# Patient Record
Sex: Male | Born: 1950
Health system: Southern US, Community
[De-identification: ages and names within clinical notes are randomized; demographics above are authoritative.]

## PROBLEM LIST (undated history)

## (undated) DIAGNOSIS — N529 Male erectile dysfunction, unspecified: Secondary | ICD-10-CM

## (undated) DIAGNOSIS — H269 Unspecified cataract: Secondary | ICD-10-CM

## (undated) DIAGNOSIS — H4050X1 Glaucoma secondary to other eye disorders, unspecified eye, mild stage: Secondary | ICD-10-CM

## (undated) DIAGNOSIS — K08109 Complete loss of teeth, unspecified cause, unspecified class: Secondary | ICD-10-CM

## (undated) DIAGNOSIS — I2510 Atherosclerotic heart disease of native coronary artery without angina pectoris: Secondary | ICD-10-CM

## (undated) DIAGNOSIS — Z972 Presence of dental prosthetic device (complete) (partial): Secondary | ICD-10-CM

## (undated) DIAGNOSIS — I219 Acute myocardial infarction, unspecified: Secondary | ICD-10-CM

## (undated) DIAGNOSIS — E785 Hyperlipidemia, unspecified: Secondary | ICD-10-CM

## (undated) DIAGNOSIS — S83209A Unspecified tear of unspecified meniscus, current injury, unspecified knee, initial encounter: Secondary | ICD-10-CM

## (undated) DIAGNOSIS — H579 Unspecified disorder of eye and adnexa: Secondary | ICD-10-CM

## (undated) DIAGNOSIS — I70219 Atherosclerosis of native arteries of extremities with intermittent claudication, unspecified extremity: Secondary | ICD-10-CM

## (undated) DIAGNOSIS — Z955 Presence of coronary angioplasty implant and graft: Secondary | ICD-10-CM

## (undated) DIAGNOSIS — I10 Essential (primary) hypertension: Secondary | ICD-10-CM

## (undated) DIAGNOSIS — M1990 Unspecified osteoarthritis, unspecified site: Secondary | ICD-10-CM

## (undated) HISTORY — DX: Essential (primary) hypertension: I10

## (undated) HISTORY — PX: NECK SURGERY: SHX720

## (undated) HISTORY — DX: Male erectile dysfunction, unspecified: N52.9

## (undated) HISTORY — DX: Unspecified cataract: H26.9

## (undated) HISTORY — DX: Atherosclerotic heart disease of native coronary artery without angina pectoris: I25.10

## (undated) HISTORY — DX: Hyperlipidemia, unspecified: E78.5

## (undated) HISTORY — PX: EYE SURGERY: SHX253

## (undated) HISTORY — PX: COLONOSCOPY: SHX174

## (undated) HISTORY — DX: Atherosclerosis of native arteries of extremities with intermittent claudication, unspecified extremity: I70.219

---

## 2002-10-21 DIAGNOSIS — I219 Acute myocardial infarction, unspecified: Secondary | ICD-10-CM

## 2002-10-21 HISTORY — DX: Acute myocardial infarction, unspecified: I21.9

## 2002-10-23 HISTORY — PX: CARDIAC CATHETERIZATION: SHX172

## 2002-12-02 ENCOUNTER — Inpatient Hospital Stay (HOSPITAL_COMMUNITY): Admission: EM | Admit: 2002-12-02 | Discharge: 2002-12-07 | Payer: Self-pay | Admitting: Cardiology

## 2002-12-02 ENCOUNTER — Encounter: Payer: Self-pay | Admitting: Emergency Medicine

## 2002-12-02 ENCOUNTER — Emergency Department (HOSPITAL_COMMUNITY): Admission: EM | Admit: 2002-12-02 | Discharge: 2002-12-02 | Payer: Self-pay | Admitting: Emergency Medicine

## 2002-12-03 ENCOUNTER — Encounter: Payer: Self-pay | Admitting: Cardiology

## 2002-12-04 ENCOUNTER — Encounter: Payer: Self-pay | Admitting: Cardiology

## 2002-12-06 ENCOUNTER — Encounter (INDEPENDENT_AMBULATORY_CARE_PROVIDER_SITE_OTHER): Payer: Self-pay | Admitting: Cardiology

## 2010-08-08 ENCOUNTER — Ambulatory Visit
Admission: RE | Admit: 2010-08-08 | Discharge: 2010-08-08 | Payer: Self-pay | Source: Home / Self Care | Attending: Family Medicine | Admitting: Family Medicine

## 2010-08-16 ENCOUNTER — Ambulatory Visit
Admission: RE | Admit: 2010-08-16 | Discharge: 2010-08-16 | Payer: Self-pay | Source: Home / Self Care | Attending: Family Medicine | Admitting: Family Medicine

## 2010-09-10 ENCOUNTER — Encounter (INDEPENDENT_AMBULATORY_CARE_PROVIDER_SITE_OTHER): Payer: Self-pay | Admitting: *Deleted

## 2010-09-10 ENCOUNTER — Encounter (INDEPENDENT_AMBULATORY_CARE_PROVIDER_SITE_OTHER): Payer: 59 | Admitting: Family Medicine

## 2010-09-10 DIAGNOSIS — Z79899 Other long term (current) drug therapy: Secondary | ICD-10-CM

## 2010-09-10 DIAGNOSIS — E119 Type 2 diabetes mellitus without complications: Secondary | ICD-10-CM

## 2010-09-10 DIAGNOSIS — I10 Essential (primary) hypertension: Secondary | ICD-10-CM

## 2010-09-10 DIAGNOSIS — Z0000 Encounter for general adult medical examination without abnormal findings: Secondary | ICD-10-CM

## 2010-09-12 ENCOUNTER — Encounter (INDEPENDENT_AMBULATORY_CARE_PROVIDER_SITE_OTHER): Payer: Self-pay

## 2010-09-13 ENCOUNTER — Encounter: Payer: Self-pay | Admitting: Gastroenterology

## 2010-09-18 NOTE — Miscellaneous (Signed)
Summary: [facility] PV  Clinical Lists Changes  Medications: Added new medication of MOVIPREP 100 GM  SOLR (PEG-KCL-NACL-NASULF-NA ASC-C) As per prep instructions. - Signed Rx of MOVIPREP 100 GM  SOLR (PEG-KCL-NACL-NASULF-NA ASC-C) As per prep instructions.;  #1 x 0;  Signed;  Entered by: [name] RN II;  Authorized by: Sable Feil MD [facility];  Method used: [facility]  [address], [place], [place]  [address], Ph: [phone] or [phone], Fax: [phone] Observations: Added new observation of ALLERGY REV: Done (09/13/2010 15:04) Added new observation of NKA: T (09/13/2010 15:04)    Prescriptions: MOVIPREP 100 GM  SOLR (PEG-KCL-NACL-NASULF-NA ASC-C) As per prep instructions.  #1 x 0   Entered by:   [name] RN II   Authorized by:   Sable Feil MD [facility]   Signed by:   [name] RN II on 09/13/2010   Method used:   [facility]ron[facility]        [facility]  [address] (retail)       [address]       [place], [place]  [address]       Ph: [phone] or [phone]       Fax: [phone]   RxID:   [id number]

## 2010-09-18 NOTE — Letter (Signed)
Summary: Pre Visit Letter Revised  [facility]  [place], [place] [address]   Phone: [phone]  Fax: [phone]        09/10/2010 MRN: [id number] Jorge Adams [address] [place], [place]  [address]             Procedure Date:  September 24, 2010   dir col-Dr Ferdie Ping to the Gastroenterology Division at [facility].    You are scheduled to see a nurse for your pre-procedure visit on September 13, 2010 at 3:30pm on the 3rd floor at [facility].  We ask that you try to arrive at our office 15 minutes prior to your appointment time to allow for check-in.  Please take a minute to review the attached form.  If you answer "Yes" to one or more of the questions on the first page, we ask that you call the person listed at your earliest opportunity.  If you answer "No" to all of the questions, please complete the rest of the form and bring it to your appointment.    Your nurse visit will consist of discussing your medical and surgical history, your immediate family medical history, and your medications.   If you are unable to list all of your medications on the form, please bring the medication bottles to your appointment and we will list them.  We will need to be aware of both prescribed and over the counter drugs.  We will need to know exact dosage information as well.    Please be prepared to read and sign documents such as consent forms, a financial agreement, and acknowledgement forms.  If necessary, and with your consent, a friend or relative is welcome to sit-in on the nurse visit with you.  Please bring your insurance card so that we may make a copy of it.  If your insurance requires a referral to see a specialist, please bring your referral form from your primary care physician.  No co-pay is required for this nurse visit.     If you cannot keep your appointment, please call [phone] to cancel or reschedule prior  to your appointment date.  This allows [place] the opportunity to schedule an appointment for another patient in need of care.    Thank you for choosing [facility] for your medical needs.  We appreciate the opportunity to care for you.  Please visit [place] at our website  to learn more about our practice.  Sincerely, The Gastroenterology Division

## 2010-09-18 NOTE — Letter (Signed)
Summary: [facility] Instructions  [facility]  [name], [place] [address]   Phone: [phone]  Fax: [phone]       Jorge Adams    30-Mar-1951    MRN: [id number]        Procedure Day Jorge Adams:  Jorge Adams  10/08/10     Arrival Time:  1:00PM     Procedure Time:  2:00PM     Location of Procedure:                    _ X_  [name] (4th Floor)                      [name]   Starting 5 days prior to your procedure 10/03/10 do not eat nuts, seeds, popcorn, corn, beans, peas,  salads, or any raw vegetables.  Do not take any fiber supplements (e.g. Metamucil, Citrucel, and Benefiber).  THE DAY BEFORE YOUR PROCEDURE         DATE: 10/07/10  DAY: SUNDAY  1.  Drink clear liquids the entire day-NO SOLID FOOD  2.  Do not drink anything colored red or purple.  Avoid juices with pulp.  No orange juice.  3.  Drink at least 64 oz. (8 glasses) of fluid/clear liquids during the day to prevent dehydration and help the prep work efficiently.  CLEAR LIQUIDS INCLUDE: Water Jello Ice Popsicles Tea (sugar ok, no milk/cream) Powdered fruit flavored drinks Coffee (sugar ok, no milk/cream) Gatorade Juice: apple, white grape, white cranberry  Lemonade Clear bullion, consomm, broth Carbonated beverages (any kind) Strained chicken noodle soup Hard Candy                             4.  In the morning, mix first dose of MoviPrep solution:    Empty 1 Pouch A and 1 Pouch B into the disposable container    Add lukewarm drinking water to the top line of the container. Mix to dissolve    Refrigerate (mixed solution should be used within 24 hrs)  5.  Begin drinking the prep at 5:00 p.m. The MoviPrep container is divided by 4 marks.   Every 15 minutes drink the solution down to the next mark (approximately 8 oz) until the full liter is complete.   6.  Follow completed prep with 16 oz of clear liquid of your choice (Nothing  red or purple).  Continue to drink clear liquids until bedtime.  7.  Before going to bed, mix second dose of MoviPrep solution:    Empty 1 Pouch A and 1 Pouch B into the disposable container    Add lukewarm drinking water to the top line of the container. Mix to dissolve    Refrigerate  THE DAY OF YOUR PROCEDURE      DATE: 10/08/10  DAY: MONDAY  Beginning at 9:00AM (5 hours before procedure):         1. Every 15 minutes, drink the solution down to the next mark (approx 8 oz) until the full liter is complete.  2. Follow completed prep with 16 oz. of clear liquid of your choice.    3. You may drink clear liquids until 12:00PM (2 HOURS BEFORE PROCEDURE).   MEDICATION INSTRUCTIONS  Unless otherwise instructed, you should take regular prescription medications with a small sip of water   as early as possible the morning of your  procedure.          OTHER INSTRUCTIONS  You will need a responsible adult at least 62 years of age to accompany you and drive you home.   This person must remain in the waiting room during your procedure.  Wear loose fitting clothing that is easily removed.  Leave jewelry and other valuables at home.  However, you may wish to bring a book to read or  an iPod/MP3 player to listen to music as you wait for your procedure to start.  Remove all body piercing jewelry and leave at home.  Total time from sign-in until discharge is approximately 2-3 hours.  You should go home directly after your procedure and rest.  You can resume normal activities the  day after your procedure.  The day of your procedure you should not:   Drive   Make legal decisions   Operate machinery   Drink alcohol   Return to work  You will receive specific instructions about eating, activities and medications before you leave.    The above instructions have been reviewed and explained to me by   Jorge Pounds RN II  September 13, 2010 3:24  PM _______________________    I fully understand and can verbalize these instructions _____________________________ Date _________

## 2010-09-24 ENCOUNTER — Other Ambulatory Visit: Payer: 59 | Admitting: Gastroenterology

## 2010-09-26 ENCOUNTER — Institutional Professional Consult (permissible substitution) (INDEPENDENT_AMBULATORY_CARE_PROVIDER_SITE_OTHER): Payer: 59 | Admitting: Family Medicine

## 2010-09-26 DIAGNOSIS — E291 Testicular hypofunction: Secondary | ICD-10-CM

## 2010-09-26 DIAGNOSIS — E559 Vitamin D deficiency, unspecified: Secondary | ICD-10-CM

## 2010-10-08 ENCOUNTER — Other Ambulatory Visit: Payer: Self-pay | Admitting: Gastroenterology

## 2010-10-08 ENCOUNTER — Other Ambulatory Visit (AMBULATORY_SURGERY_CENTER): Payer: 59 | Admitting: Gastroenterology

## 2010-10-08 DIAGNOSIS — Z1211 Encounter for screening for malignant neoplasm of colon: Secondary | ICD-10-CM

## 2010-10-08 DIAGNOSIS — D126 Benign neoplasm of colon, unspecified: Secondary | ICD-10-CM

## 2010-10-08 HISTORY — PX: COLONOSCOPY: SHX174

## 2010-10-15 ENCOUNTER — Encounter: Payer: Self-pay | Admitting: Gastroenterology

## 2010-10-18 NOTE — Procedures (Signed)
Summary: Colonoscopy  Patient: [name] Note: All result statuses are Final unless otherwise noted.  Tests: (1) Colonoscopy (COL)   COL Colonoscopy           [facility]     [place], [place]  [address]          COLONOSCOPY PROCEDURE REPORT          PATIENT:  Jorge Adams, Jorge Adams  MR#:  [id number]     BIRTHDATE:  01-08-1951, 11 yrs. old  GENDER:  male     ENDOSCOPIST:  Loralee Pacas. Sharlett Iles, MD, [facility]     REF. BY:  Jill Alexanders, M.D.     PROCEDURE DATE:  10/08/2010     PROCEDURE:  Colonoscopy with snare polypectomy     ASA CLASS:  Class II     INDICATIONS:  Routine Risk Screening     MEDICATIONS:   Fentanyl 50 mcg IV, Versed 7 mg IV          DESCRIPTION OF PROCEDURE:   After the risks benefits and     alternatives of the procedure were thoroughly explained, informed     consent was obtained.  Digital rectal exam was performed and     revealed no abnormalities.   The LB CF-H180AL [id number] endoscope     was introduced through the anus and advanced to the cecum, which     was identified by both the appendix and ileocecal valve, without     limitations.  The quality of the prep was excellent, using     MoviPrep.  The instrument was then slowly withdrawn as the colon     was fully examined.     <<PROCEDUREIMAGES>>          FINDINGS:  A sessile polyp was found. 5MM HEPATIC FLEXURE POLYP     SNARE EXCISED.  A sessile polyp was found in the sigmoid colon.     5MM SIGMOID POLYP SNARE EXCISED ALSO.  This was otherwise a normal     examination of the colon.   Retroflexed views in the rectum     revealed no abnormalities.    The scope was then withdrawn from     the patient and the procedure completed.          COMPLICATIONS:  None     ENDOSCOPIC IMPRESSION:     1) Sessile polyp     2) Sessile polyp in the sigmoid colon     3) Otherwise normal examination     R/O ADENOMAS.     RECOMMENDATIONS:     1) Repeat Colonoscopy in 5 years.  REPEAT EXAM:  No          ______________________________     Loralee Pacas. Sharlett Iles, MD, [facility]          CC:          n.     eSIGNED:   Loralee Pacas. Deivi Huckins at 10/08/2010 02:45 PM          Rosita Fire, [id number]  Note: An exclamation mark (!) indicates a result that was not dispersed into the flowsheet. Document Creation Date: 10/08/2010 2:45 PM _______________________________________________________________________  (1) Order result status: Final Collection or observation date-time: 10/08/2010 14:34 Requested date-time:  Receipt date-time:  Reported date-time:  Referring Physician:   Ordering Physician: Verl Blalock [phone]) Specimen Source:  Source: Tawanna Cooler Order Number: [id number] Lab site:

## 2010-12-07 NOTE — H&P (Signed)
NAME:  Jorge Adams, Jorge Adams NO.:  [id number]   MEDICAL RECORD NO.:  [id number]                   PATIENT TYPE:  EMS   LOCATION:  ED                                   FACILITY:  [facility]   PHYSICIAN:  Bryson Dames, M.D.             DATE OF BIRTH:  Aug 13, 1950   DATE OF ADMISSION:  12/02/2002  DATE OF DISCHARGE:                                HISTORY & PHYSICAL   CHIEF COMPLAINT:  The patient had ventricular fibrillation on the floor  while visiting his wife in the hospital here at [facility].   HISTORY OF PRESENT ILLNESS:  The patient is a 60 year old male with no prior  history of coronary disease or MI or syncope.  He was admitted to the  emergency room at [facility] after a syncopal spell, with  documented VF while visiting his wife.  Symptoms were preceded by chest  tightness.  He admits to substernal chest tightness in the last few days  with radiation to his jaw.  He has no discomfort currently.  His EKG is  without acute changes.   PAST MEDICAL HISTORY:  His past medical history is remarkable for  hypertension and hyperlipidemia.  He was told recently his cholesterol was  317 and that they would try to get this under control with diet.   PAST SURGICAL HISTORY:  He has had no major surgeries.   MEDICATIONS AT HOME:  Lisinopril and hydrochlorothiazide.   ALLERGIES:  He has no known drug allergies.   SOCIAL HISTORY:  He is married, he has two sons, smokes a pack and a half a  day.  His wife is hospitalized here with bone cancer and complications.   FAMILY HISTORY:  His family history is unremarkable for coronary disease.  His mother died of cancer.  His father is alive at 80 and has a history of  hypertension.  He has one brother without coronary disease.   REVIEW OF SYSTEMS:  The patient has poor dentition.  He has had some  nocturia, up to two to three times a night.  He has no history of BPH.  He  has no history of fever or  chills.  He has no history of kidney disease.  There is no history of prior syncope.  He has no history of peptic ulcer  disease or GI bleeding.  Review of systems otherwise unremarkable except for  noted above.   PHYSICAL EXAMINATION:  VITAL SIGNS:  Blood pressure 140/92, pulse 100,  temperature 97.2.  GENERAL:  In general, he is a well-developed, well-nourished male in no  acute distress.  HEENT:  He is normocephalic.  Extraocular movement are intact.  Sclerae are  nonicteric.  Lids and conjunctivae are within normal limits.  He does have  poor dentition.  NECK:  Neck is without bruit and without JVD.  CHEST:  Chest is clear to auscultation and percussion.  CARDIAC:  Exam reveals a regular rate and rhythm without obvious murmur, rub  or gallop.  ABDOMEN:  Abdomen is nontender.  No hepatosplenomegaly.  EXTREMITIES:  Extremities are without edema.  Pulses are 2+/4 bilaterally.  NEUROLOGIC:  Exam is grossly intact.   LABORATORY AND ACCESSORY CLINICAL DATA:  His EKG shows sinus rhythm without  acute changes.   Sodium 137, potassium 3.4, BUN 10, creatinine 0.9.  White count 17.6,  hemoglobin 17.3, hematocrit 49.4, platelets 271,000.  INR 0.8.  CK is 420,  MB 55, troponin 2.3.   Chest x-ray is pending.   IMPRESSION:  1. Ventricular fibrillation, shocked once to sinus rhythm.  2. Subacute endocardial myocardial infarction.  3. History of hypertension.  4. Untreated hyperlipidemia.  5. Elevated white count, unclear etiology, possibly stress from myocardial     infarction.   PLAN:  The patient was started on aspirin, heparin, nitrates, beta blocker  and continued on IV lidocaine.  He will need catheterization, we may need to  take him this evening.     Erlene Quan, P.A.                      Bryson Dames, M.D.    Meryl Dare  D:  12/02/2002  T:  12/03/2002  Job:  [id number]

## 2010-12-07 NOTE — Discharge Summary (Signed)
NAME:  Jorge Adams, Jorge Adams NO.:  [id number]   MEDICAL RECORD NO.:  [id number]                   PATIENT TYPE:  INP   LOCATION:  2005                                 FACILITY:  [facility]   PHYSICIAN:  Bryson Dames, M.D.             DATE OF BIRTH:  [birth year]   DATE OF ADMISSION:  12/02/2002  DATE OF DISCHARGE:  12/07/2002                                 DISCHARGE SUMMARY   HISTORY:  Mr. Jorge Adams is a 60 year old male patient who was  visiting his wife at [facility] when he had a V fib arrest.  His  arrhythmia was preceded by some chest pain.  He was seen by Dr. Melvern Banker  initially and sent to [facility] via __________ for emergent  cardiac catheterization.  His first troponin apparently was positive.  He  apparently had been having chest pain on and off for several days prior to  his cardiac arrest.  He was defibrillated one time with a return to sinus  rhythm.   He underwent his cardiac catheterization on Dec 02, 2002, at 7:45 p.m.; he  was found to have a 90% stenosis of a large OM-1.  He underwent TAXUS stent  placement, 2.5 x 24.  He also had residual disease, 95% in his RCA and a 30%  in his LAD.  He had 100% old distal RCA.  He had an inferior scar and new  anterolateral hypokinesis, thought to be related to his acute 90% lesion in  his OM-1.   Post procedure, he had continuous Integrilin for 18 hours.  They continued  his IV lidocaine, nitroglycerin, heparin, and he was placed on Plavix.  He  was also found to have hypokalemia.  He apparently, as an outpatient, had  been on lisinopril, HCTZ.  This was replaced.  The following day, he had  symptoms of cold feet; this was thought secondary to beta blockers; however,  the cold feet then progressed to pain in his feet with his right foot worse  than his left.  His toes then became bluish.  He underwent Dopplers, which  showed his ABIs were 100%.  He was seen by Dr. Scot Dock  for a vascular  consult; it was determined that he probably had an atheroemboli, and no  further treatment or workup was needed, that his discomfort would resolve  over a period of time.  He was seen by cardiac rehab; a rolling walker was  used for his ambulation, which helped immensely.  A 2-D echo was performed,  which showed an EF of 55 to 65%.  Right ventricular systolic function was  hyperdynamic.  There was no mention of thrombus and no pericardial effusion.   On Dec 07, 2002, his blood pressure was 110/84, heart rate was 98,  temperature was 97.  He was seen by Dr. Claiborne Billings with the decision to increase  his Lopressor to 50 mg b.i.d., and  he will follow up with Dr. Melvern Banker as an  outpatient.   LABORATORY DATA:  On Dec 06, 2002, his hemoglobin was 14, hematocrit 41,  WBCs were 8.9, platelets were 222.  Sodium was 135, potassium was 4.3,  glucose was 114, BUN 10, creatinine 0.7.  CK-MB number one 1159/140,  troponin of 20.60, number two 1180/128, troponin of 18.71, number three  916/73, troponin of 16.76.  Total cholesterol was 223, triglycerides were  277, his HDL was 36, his LDL was 132, his VLDL was 55.  TSH was 1.054.  Chest x-ray showed minimal infiltrate or atelectasis at the left lung base.  EKG showed a nonspecific T-wave abnormality, sinus rhythm.   DISCHARGE MEDICATIONS:  1. Lopressor 50 mg twice per day.  2. Plavix 75 mg once per day.  3. Zocor 80 mg once per day.  4. Lisinopril 20 mg once per day.  5. Aspirin 81 mg once per day.  6. Protonix 40 mg once per day.  7. Nitroglycerin 1/150 under the tongue every 5 minutes times three for     chest pain when needed.   ACTIVITY:  He should do no strenuous activity, no driving or returning to  work until seen in followup.   DISCHARGE INSTRUCTIONS:  If he has any problems with his groin, he will give  [place] a call.   FOLLOW UP:  He will follow up with Dr. Melvern Banker on December 23, 2002, at 3 p.m.   DISCHARGE DIAGNOSES:  1. Acute  non-Q-wave myocardial infarction, anatomically anterolateral,     status post catheterization with a 90% first obtuse marginal with     subsequent TAXUS stent placed.  2. 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  3. Positive residual disease, 30% in his left anterior descending, 95% in     mid right coronary artery with 100% distal right coronary artery, which     was thought to be old.  4. Normal ejection fraction by echocardiogram.  5. Hyperlipidemia.  6. Hypertension.  7. Ventricular fibrillation arrest at onset of acute diaphragmatic     myocardial infarction.     Cyndia Bent, N.P.                        Bryson Dames, M.D.    BB/MEDQ  D:  02/02/2003  T:  02/03/2003  Job:  [id number]   cc:   Jill Alexanders, M.D.  [address], [place] [address]  Fax: [phone]    cc:   Jill Alexanders, M.D.  [address] [place], [place] [address]  Fax: [phone]

## 2010-12-07 NOTE — Consult Note (Signed)
NAME:  Jorge Adams, SLATES                      ACCOUNT NO.:  [id number]   MEDICAL RECORD NO.:  [id number]                   PATIENT TYPE:  INP   LOCATION:  2908                                 FACILITY:  [facility]   PHYSICIAN:  Judeth Cornfield. Scot Dock, M.D.        DATE OF BIRTH:  05/21/1951   DATE OF CONSULTATION:  DATE OF DISCHARGE:                                   CONSULTATION   REASON FOR CONSULTATION:  Atheroembolic disease to the right foot.   HISTORY:  This is a pleasant 60 year old gentleman who was visiting his wife  at [facility] three days ago when he apparently had a cardiac  arrest. He had ventricular fibrillation and was successfully shocked. He was  transferred to [facility] and underwent emergent cardiac catheterization by Dr.  Melvern Banker on Dec 02, 2002.  Via a right femoral approach he had a percutaneous  coronary intervention and stenting which was successful, and he has done  well since that time.  However, since his cath he has noted some pain in the  toes of the right foot.  He denies any previous history of claudication,  rest pain, or non-healing ulcers. The pain has been fairly persistent.   PAST MEDICAL HISTORY:  Significant for hypertension and mildly elevated  cholesterol. He has also obviously had a heart attack at this admission.  He  denies any history of diabetes or history of congestive heart failure.   SOCIAL HISTORY:  He is married and has two children.  He smokes one and half  packs per day, but states he has quit as of now.   FAMILY HISTORY:  There is no history of premature cardiovascular disease.   REVIEW OF SYSTEMS:  On review of systems he had been having occasional chest  pain prior to this episode.  He also admits to mild dyspnea on exertion. He  has had no previous history of stroke, TIAs, or amaurosis fugax.  He has had  no history of DVT or phlebitis.   PHYSICAL EXAMINATION:  VITAL SIGNS:  Blood pressure is 130/80. Heart rate is  75.   Temperature 97.6.  HEENT:  There is no cervical lymphadenopathy. I did not detect any carotid  bruits.  LUNGS: The lungs are clear bilaterally to auscultation.  CARDIAC: He has a regular rate and rhythm.  ABDOMEN:  The abdomen is soft and nontender.  I cannot palpate an aneurysm,  although his abdomen is somewhat obese and difficult to assess.  EXTREMITIES:  He has palpable femoral, popliteal and posterior tibial pulses  bilaterally. I cannot palpate dorsalis pedis pulses, although he does have  dorsalis pedis signals with the Doppler. He has triphasic wave forms on his  Doppler study.  The ABIs are 100% bilaterally.  He has some bluish  discoloration of the toes on the right foot.  There are no ischemic ulcers.   I agree that this patient has likely had an atheroembolic event to the right  foot and, given his normal ABIs and palpable pulses, this should improve  with time.  I do not see need for any further vascular workup at this time.  I will be happy to see the patient again at any time.  Thanks for allowing  [place] to participate in this nice patient's care.                                               Judeth Cornfield. Scot Dock, M.D.   CSD/MEDQ  D:  12/05/2002  T:  12/05/2002  Job:  [id number]

## 2010-12-07 NOTE — Cardiovascular Report (Signed)
NAME:  Jorge Adams, Jorge Adams                      ACCOUNT NO.:  [id number]   MEDICAL RECORD NO.:  [id number]                   PATIENT TYPE:  INP   LOCATION:  2908                                 FACILITY:  [facility]   PHYSICIAN:  Bryson Dames, M.D.             DATE OF BIRTH:  1951-04-01   DATE OF PROCEDURE:  10/23/2002  DATE OF DISCHARGE:                              CARDIAC CATHETERIZATION   PROCEDURES PERFORMED:  1. Emergency acute combined left heart catheterization for acute myocardial     infarction consisting of:     a. Selective coronary angiography of the native coronary circulation by        Judkins technique.     b. Retrograde left heart catheterization.     c. Left ventricular angiography.  2. Percutaneous transluminal coronary balloon angioplasty of the proximal     obtuse marginal branch of the left circumflex.  3. Intracoronary artery stenting of proximal and mid-obtuse marginal branch     of the left circumflex.   ENTRY SITE:  Right femoral.   DYE USED:  Omnipaque.   MEDICATIONS GIVEN:  Intravenous nitroglycerin, intravenous heparin,  intravenous Integrilin, intravenous fentanyl.   COMPLICATIONS:  None.   PATIENT PROFILE:  The patient is a 60 year old, previously diagnosed only  with hypertension, who chronically smokes, who was well until today.  Today,  at around 3:15 p.m., while visiting his wife, who is a patient at [facility], the patient went into her room and there had some central  chest discomfort, apparently fell in the floor and had a cardiac arrest that  was noted to be coarse ventricular fibrillation on cardiac monitoring.  It  was converted with a single shock of direct-current cardioversion to sinus  tachycardia and the patient was transferred to the emergency room.  There,  he was evaluated by the emergency department physician, who stabilized his  condition and called [place].  When we arrived, he was not having chest pain, but  his  cardiac enzymes returned showing a troponin of greater than 2.0 and CK  and MBs clearly positive for myocardial infarction.  The patient was moved  from [facility] Emergency Room to the cardiac cath lab by [facility] and  emergency cardiac catheterization begun urgently.   RESULTS:   PRESSURES:  The left ventricular pressure was [id number], end-diastolic pressure  17; central aortic pressure 140/90, mean of 115.  No aortic valve gradient  by pullback technique.   ANGIOGRAPHIC RESULTS:  The left main coronary artery was large and  unremarkable in appearance.  The LAD was a fairly large vessel, markedly  tortuous in the distal one-half.  There was a 40% lesion proximally,  followed by an area of mid LAD that was normal.  The distal vessel was  widely patent, though irregular and very tortuous.  The LAD also  collateralized the distal RCA in a rich fashion with very large collaterals  to the  distal RCA.  Two diagonal branches arose from the LAD that were  insignificantly diseased.   The left circumflex was a large vessel, giving rise to one large acute  obtuse marginal branch that trifurcated distally.  This vessel was 90%  stenosed proximally and there was TIMI class 2 distal flow into the tertiary  branches.  The proximal and midportion of the circumflex were normal.   The right coronary artery was long in length but small in caliber and was  diffusely diseased, including a 90% mid RCA stenosis before the vessel  basically occluded distally and became a recanalized, totally occluded  vessel with rich collaterals in a retrograde fashion from the LAD.   Left ventricle in the RAO projection showed anterolateral wall hypokinesis  fairly low on the anterolateral wall and discrete and mild-to-moderate in  severity.  The LAO ventriculogram showed marked hypokinesis of the  posterolateral wall.   There was no LV thrombus.  Overall ejection fraction was estimated at about  45%.   PERCUTANEOUS  INTERVENTION:  This was performed with a 7-French introducer  sheath, a 7-French catheter, which was a 7-French Voda 3.5 guide catheter.  The guidewire used was a Patriot wire and the initial predilatation balloon  used in the obtuse marginal was a 2.5 x 9.0-mm Maverick.  I dilated the 90%  stenosis with atmospheres of pressure, then moved more distally to two more  distal locations, inflating the balloon to 4 to 6 atmospheres each time;  this greatly enhanced the ability to then pass a 2.5 x 24.0-mm TAXUS drug-  eluting stent into the proximal obtuse marginal branch, extending into the  mid-vessel, and deployed it to 16 atmospheres of pressure and then post-  dilatation with a 2.5 x 20.0 Quantum Maverick balloon deployed to 19  atmospheres of pressure.  The result was restoration of TIMI-3 distal flow  and resolution of all areas of stenosis in the proximal and mid-obtuse  marginal branch #1.   No complications occurred.  The patient had ACTs of about 370 during the  case and tolerated it well.   FINAL DIAGNOSES:  1. Acute non-Q-wave myocardial infarction, anatomically anterolateral by     left ventricular angiography.  2. Successful percutaneous stenting of the first obtuse marginal branch,     which was the infarct-related vessel.  3. Two-vessel coronary artery disease of right coronary artery and obtuse     marginal branch #1 of the left circumflex.                                               Bryson Dames, M.D.    [facility]/MEDQ  D:  12/02/2002  T:  12/04/2002  Job:  [id number]   cc:   [facility]   Jill Alexanders, M.D.  [address] [place], [place] [address]  Fax: [phone]

## 2011-01-02 ENCOUNTER — Encounter: Payer: Self-pay | Admitting: Family Medicine

## 2011-01-04 ENCOUNTER — Ambulatory Visit: Payer: 59 | Admitting: Family Medicine

## 2011-01-09 ENCOUNTER — Ambulatory Visit: Payer: 59 | Admitting: Family Medicine

## 2011-01-28 ENCOUNTER — Ambulatory Visit (INDEPENDENT_AMBULATORY_CARE_PROVIDER_SITE_OTHER): Payer: 59 | Admitting: Family Medicine

## 2011-01-28 ENCOUNTER — Telehealth: Payer: Self-pay

## 2011-01-28 ENCOUNTER — Encounter: Payer: Self-pay | Admitting: Family Medicine

## 2011-01-28 VITALS — BP 128/80 | HR 72 | Wt 233.0 lb

## 2011-01-28 DIAGNOSIS — E1165 Type 2 diabetes mellitus with hyperglycemia: Secondary | ICD-10-CM | POA: Insufficient documentation

## 2011-01-28 DIAGNOSIS — E785 Hyperlipidemia, unspecified: Secondary | ICD-10-CM

## 2011-01-28 DIAGNOSIS — E559 Vitamin D deficiency, unspecified: Secondary | ICD-10-CM | POA: Insufficient documentation

## 2011-01-28 DIAGNOSIS — E291 Testicular hypofunction: Secondary | ICD-10-CM

## 2011-01-28 DIAGNOSIS — E119 Type 2 diabetes mellitus without complications: Secondary | ICD-10-CM

## 2011-01-28 DIAGNOSIS — E669 Obesity, unspecified: Secondary | ICD-10-CM | POA: Insufficient documentation

## 2011-01-28 DIAGNOSIS — I10 Essential (primary) hypertension: Secondary | ICD-10-CM

## 2011-01-28 DIAGNOSIS — E1159 Type 2 diabetes mellitus with other circulatory complications: Secondary | ICD-10-CM | POA: Insufficient documentation

## 2011-01-28 DIAGNOSIS — I2510 Atherosclerotic heart disease of native coronary artery without angina pectoris: Secondary | ICD-10-CM

## 2011-01-28 DIAGNOSIS — E1169 Type 2 diabetes mellitus with other specified complication: Secondary | ICD-10-CM

## 2011-01-28 DIAGNOSIS — I152 Hypertension secondary to endocrine disorders: Secondary | ICD-10-CM | POA: Insufficient documentation

## 2011-01-28 NOTE — Telephone Encounter (Signed)
PT WILL RETURN FOR BLOOD WORK

## 2011-01-28 NOTE — Patient Instructions (Signed)
Check your blood sugars either before or 2 hours after a meal. Get back on your exercise routine as quickly she can. Use of testosterone regularly and I will recheck this in one month. I will call you with results of the vitamin D level.

## 2011-01-28 NOTE — Progress Notes (Signed)
  Subjective:    Patient ID: Jorge Adams, male    DOB: January 19, 1951, 60 y.o.   MRN: [id number]  HPI He is here for a recheck. He continues on medications listed in the chart. He did however stop his tests and stating he didn't think he was doing him any good. His exercise has diminished do to the weather. He does check his blood sugars usually in the morning and they run in the low 100s. He last was cardiologist in January. He did take his vitamin D preparation. He has an eye exam scheduled in the near future. He does check his feet periodically. He's had no chest pain, shortness of breath. He continues to work.   Review of Systems     Objective:   Physical Exam Alert and in no distress. Blood pressure and weight are recorded   .  A1c is 6.6    Assessment & Plan:  Hypertension. Hyperlipidemia. ASHD. Hypogonadism. vitamin D deficient. Obesity. Continue on present medication regimen

## 2011-01-30 ENCOUNTER — Telehealth: Payer: Self-pay

## 2011-01-30 NOTE — Telephone Encounter (Signed)
Called pt to let him know vit is low normal and to take multi vit

## 2011-02-28 ENCOUNTER — Ambulatory Visit (INDEPENDENT_AMBULATORY_CARE_PROVIDER_SITE_OTHER): Payer: 59 | Admitting: Family Medicine

## 2011-02-28 ENCOUNTER — Encounter: Payer: Self-pay | Admitting: Family Medicine

## 2011-02-28 VITALS — BP 142/90 | HR 68 | Ht 71.0 in | Wt 234.0 lb

## 2011-02-28 DIAGNOSIS — E291 Testicular hypofunction: Secondary | ICD-10-CM

## 2011-02-28 DIAGNOSIS — Z79899 Other long term (current) drug therapy: Secondary | ICD-10-CM

## 2011-02-28 NOTE — Progress Notes (Signed)
  Subjective:    Patient ID: Jorge Adams, male    DOB: 10/19/1950, 60 y.o.   MRN: [id number]  HPI  he is here for a recheck on his testosterone. He has noted an increase in his stamina and energy as well as libido.   Review of Systems     Objective:   Physical Exam Alert and in no distress otherwise not examined.       Assessment & Plan:  Hypogonadism Testosterone level will be drawn.

## 2011-03-04 ENCOUNTER — Telehealth: Payer: Self-pay

## 2011-03-04 NOTE — Telephone Encounter (Signed)
Called pt informed him testosterone was low to double up on med and recheck in one month

## 2011-05-06 ENCOUNTER — Encounter: Payer: Self-pay | Admitting: Family Medicine

## 2011-07-08 ENCOUNTER — Telehealth: Payer: Self-pay

## 2011-07-09 NOTE — Telephone Encounter (Signed)
This was open by accident

## 2012-04-21 ENCOUNTER — Ambulatory Visit
Admission: RE | Admit: 2012-04-21 | Discharge: 2012-04-21 | Disposition: A | Discharge status: Home / Self Care | Payer: 59 | Source: Ambulatory Visit | Attending: Family Medicine | Admitting: Family Medicine

## 2012-04-21 ENCOUNTER — Encounter: Payer: Self-pay | Admitting: Family Medicine

## 2012-04-21 ENCOUNTER — Other Ambulatory Visit: Payer: Self-pay | Admitting: Family Medicine

## 2012-04-21 ENCOUNTER — Ambulatory Visit (INDEPENDENT_AMBULATORY_CARE_PROVIDER_SITE_OTHER): Payer: 59 | Admitting: Family Medicine

## 2012-04-21 VITALS — BP 130/78 | HR 90 | Wt 237.0 lb

## 2012-04-21 DIAGNOSIS — I152 Hypertension secondary to endocrine disorders: Secondary | ICD-10-CM

## 2012-04-21 DIAGNOSIS — E119 Type 2 diabetes mellitus without complications: Secondary | ICD-10-CM

## 2012-04-21 DIAGNOSIS — Z91199 Patient's noncompliance with other medical treatment and regimen due to unspecified reason: Secondary | ICD-10-CM

## 2012-04-21 DIAGNOSIS — I10 Essential (primary) hypertension: Secondary | ICD-10-CM

## 2012-04-21 DIAGNOSIS — M25561 Pain in right knee: Secondary | ICD-10-CM

## 2012-04-21 DIAGNOSIS — M25569 Pain in unspecified knee: Secondary | ICD-10-CM

## 2012-04-21 DIAGNOSIS — I2510 Atherosclerotic heart disease of native coronary artery without angina pectoris: Secondary | ICD-10-CM

## 2012-04-21 DIAGNOSIS — Z9119 Patient's noncompliance with other medical treatment and regimen: Secondary | ICD-10-CM

## 2012-04-21 DIAGNOSIS — E669 Obesity, unspecified: Secondary | ICD-10-CM

## 2012-04-21 DIAGNOSIS — E1169 Type 2 diabetes mellitus with other specified complication: Secondary | ICD-10-CM

## 2012-04-21 NOTE — Progress Notes (Signed)
  Subjective:    Patient ID: Jorge Adams, male    DOB: Dec 06, 1950, 61 y.o.   MRN: [id number]  HPI He is here for consult. A recent note from Dr. Einar Gip shows a hemoglobin A1c of 9.9. Review of his record indicates he was diagnosed in January 2012 however he did not followup appropriately since that time. He now states that he is willing to make the changes necessary. He realizes that he would like to have a longer life. He continues on medications listed in the chart. He does have a blood sugar machine at home and has occasionally checked it but has not done anything when he noted blood sugars in the 200 range. He has had no polyuria, polydipsia or weight loss. He also complains of a six-month history of right knee pain. No history of injury to it but does grind. He's had no popping, locking or swelling.  Review of Systems     Objective:   Physical Exam Alert and in no distress. Exam of the right knee shows no effusion. There is some posterior medial joint line tenderness with positive McMurray's. Medial collateral and anterior cruciate ligaments intact.       Assessment & Plan:   1. Obesity (BMI 30-39.9)    2. Hypertension associated with diabetes    3. Diabetes mellitus    4. ASHD (arteriosclerotic heart disease)    5. Personal history of noncompliance with medical treatment, presenting hazards to health    6. Right knee pain  DG Knee Complete 4 Views Right   I again had a long discussion with him concerning diabetes, heart disease, eye care, foot care, risk of further heart trouble as well as CVA, renal issues and neurologic complications. Encouraged him to become more physically active. I will place him on metformin. Discussed possible side effects with him. Recheck here in one month. He will also be set up for diabetes education.

## 2012-04-21 NOTE — Patient Instructions (Signed)
You can go to the American diabetes association website or Familydoctor.org Take 2 Aleve twice per day. Use heat for 20 minutes or 4 times per day. This does not help, set up another appointment for followup on this

## 2012-04-22 ENCOUNTER — Telehealth: Payer: Self-pay | Admitting: Family Medicine

## 2012-04-22 MED ORDER — METFORMIN HCL 500 MG PO TABS: 500.0000 mg | ORAL_TABLET | Freq: Two times a day (BID) | ORAL | Status: DC

## 2012-04-22 NOTE — Telephone Encounter (Signed)
PT CALLED AND STATED THAT HE WAS TO BE PLACED ON METFORMIN. I CAN SEE IN NOTES THAT YOU DID NOTE THAT HOWEVER I CANT SEE THAT IT WAS EVER PLACED. PLEASE LET ME KNOW WHAT YOU WANT HIM TO BE ON AND I WILL CALL IN IT. PT USES [facility] IN [place]

## 2012-04-22 NOTE — Telephone Encounter (Signed)
Let him know that it was my mistake. I just called it in

## 2012-04-23 NOTE — Telephone Encounter (Signed)
Pt informed

## 2012-05-12 ENCOUNTER — Encounter: Payer: 59 | Attending: Family Medicine | Admitting: *Deleted

## 2012-05-12 VITALS — Ht 71.0 in | Wt 231.8 lb

## 2012-05-12 DIAGNOSIS — E119 Type 2 diabetes mellitus without complications: Secondary | ICD-10-CM | POA: Insufficient documentation

## 2012-05-12 DIAGNOSIS — E669 Obesity, unspecified: Secondary | ICD-10-CM | POA: Insufficient documentation

## 2012-05-12 DIAGNOSIS — Z713 Dietary counseling and surveillance: Secondary | ICD-10-CM | POA: Insufficient documentation

## 2012-05-19 ENCOUNTER — Encounter: Payer: 59 | Admitting: *Deleted

## 2012-05-19 DIAGNOSIS — E119 Type 2 diabetes mellitus without complications: Secondary | ICD-10-CM

## 2012-05-20 NOTE — Patient Instructions (Signed)
Goals:  Follow Diabetes Meal Plan as instructed  Eat 3 meals and 2 snacks, every 3-5 hrs  Limit carbohydrate intake to 45-60 grams carbohydrate/meal  Limit carbohydrate intake to 15-30 grams carbohydrate/snack  Add lean protein foods to meals/snacks  Monitor glucose levels as instructed by your doctor  Aim for 30 mins of physical activity daily  Bring food record and glucose log to your next nutrition visit 

## 2012-05-20 NOTE — Progress Notes (Signed)
  Patient was seen on 05/19/12 for the second of a series of three diabetes self-management courses at the [facility]. The following learning objectives were met by the patient during this course:   Explain basic nutrition maintenance and quality assurance  Describe causes, symptoms and treatment of hypoglycemia and hyperglycemia  Explain how to manage diabetes during illness  Describe the importance of good nutrition for health and healthy eating strategies  List strategies to follow meal plan when dining out  Describe the effects of alcohol on glucose and how to use it safely  Describe problem solving skills for day-to-day glucose challenges  Describe strategies to use when treatment plan needs to change  Identify important factors involved in successful weight loss  Describe ways to remain physically active  Describe the impact of regular activity on insulin resistance   Handouts given in class:  Refrigerator magnet for Sick Day Guidelines  [facility] Oral medication/insulin handout  Follow-Up Plan: Patient will attend the final class of the ADA Diabetes Self-Care Education.   

## 2012-05-21 ENCOUNTER — Encounter: Payer: Self-pay | Admitting: *Deleted

## 2012-05-21 ENCOUNTER — Telehealth: Payer: Self-pay | Admitting: Family Medicine

## 2012-05-21 MED ORDER — GLUCOSE BLOOD VI STRP: ORAL_STRIP | Status: DC

## 2012-05-21 MED ORDER — LANCETS MISC: Status: DC

## 2012-05-21 NOTE — Progress Notes (Signed)
  Patient was seen on 05/12/2012 for the first of a series of three diabetes self-management courses at the [facility]. The following learning objectives were met by the patient during this course:   Defines the role of glucose and insulin  Identifies type of diabetes and pathophysiology  Defines the diagnostic criteria for diabetes and prediabetes  States the risk factors for Type 2 Diabetes  States the symptoms of Type 2 Diabetes  Defines Type 2 Diabetes treatment goals  Defines Type 2 Diabetes treatment options  States the rationale for glucose monitoring  Identifies A1C, glucose targets, and testing times  Identifies proper sharps disposal  Defines the purpose of a diabetes food plan  Identifies carbohydrate food groups  Defines effects of carbohydrate foods on glucose levels  Identifies carbohydrate choices/grams/food labels  States benefits of physical activity and effect on glucose  Review of suggested activity guidelines  Handouts given during class include:  Type 2 Diabetes: Basics Book  My [place] and Activity Log  Follow-Up Plan: Core Class 2 & 3

## 2012-05-21 NOTE — Telephone Encounter (Signed)
Explain that insurance will not pay but for once per day.

## 2012-05-21 NOTE — Patient Instructions (Signed)
Goals:  Follow Diabetes Meal Plan as instructed  Eat 3 meals and 2 snacks, every 3-5 hrs  Limit carbohydrate intake to 60 grams carbohydrate/meal  Limit carbohydrate intake to 0-30 grams carbohydrate/snack  Add lean protein foods to meals/snacks  Monitor glucose levels as instructed by your doctor  Aim for 15-30 mins of physical activity daily  Bring food record and glucose log to your next nutrition visit

## 2012-05-21 NOTE — Telephone Encounter (Signed)
SENT IN [employer] CALLED [facility] PHONE LEFT MESSAGE HE ONLY NEEDS TO TEST 1 TIME A DAY

## 2012-05-22 DIAGNOSIS — S83209A Unspecified tear of unspecified meniscus, current injury, unspecified knee, initial encounter: Secondary | ICD-10-CM

## 2012-05-22 HISTORY — DX: Unspecified tear of unspecified meniscus, current injury, unspecified knee, initial encounter: S83.209A

## 2012-05-25 ENCOUNTER — Encounter: Payer: Self-pay | Admitting: Internal Medicine

## 2012-05-29 ENCOUNTER — Encounter: Payer: Self-pay | Admitting: Family Medicine

## 2012-05-29 ENCOUNTER — Ambulatory Visit (INDEPENDENT_AMBULATORY_CARE_PROVIDER_SITE_OTHER): Payer: 59 | Admitting: Family Medicine

## 2012-05-29 ENCOUNTER — Telehealth: Payer: Self-pay

## 2012-05-29 VITALS — BP 140/82 | HR 72 | Wt 230.0 lb

## 2012-05-29 DIAGNOSIS — I10 Essential (primary) hypertension: Secondary | ICD-10-CM

## 2012-05-29 DIAGNOSIS — E785 Hyperlipidemia, unspecified: Secondary | ICD-10-CM

## 2012-05-29 DIAGNOSIS — E1169 Type 2 diabetes mellitus with other specified complication: Secondary | ICD-10-CM

## 2012-05-29 DIAGNOSIS — I2510 Atherosclerotic heart disease of native coronary artery without angina pectoris: Secondary | ICD-10-CM

## 2012-05-29 DIAGNOSIS — I152 Hypertension secondary to endocrine disorders: Secondary | ICD-10-CM

## 2012-05-29 DIAGNOSIS — E669 Obesity, unspecified: Secondary | ICD-10-CM

## 2012-05-29 DIAGNOSIS — E119 Type 2 diabetes mellitus without complications: Secondary | ICD-10-CM

## 2012-05-29 DIAGNOSIS — E1159 Type 2 diabetes mellitus with other circulatory complications: Secondary | ICD-10-CM

## 2012-05-29 DIAGNOSIS — E291 Testicular hypofunction: Secondary | ICD-10-CM

## 2012-05-29 LAB — POCT UA - MICROALBUMIN
Albumin/Creatinine Ratio, Urine, POC: 6.6
Creatinine, POC: 102.4 mg/dL
Microalbumin Ur, POC: 6.8 mg/dL

## 2012-05-29 LAB — POCT GLYCOSYLATED HEMOGLOBIN (HGB A1C): Hemoglobin A1C: 7.5

## 2012-05-29 MED ORDER — TESTOSTERONE 30 MG/ACT TD SOLN: 2.0000 "application " | Freq: Every morning | TRANSDERMAL | Status: DC

## 2012-05-29 NOTE — Telephone Encounter (Signed)
Let them know that I will followup on this as part of his diabetes care and have him return here in approximately 4 months. He should be scheduled. Tell him that I will followup on the blood work and he does not need to go to [employer] [facility] [facility]

## 2012-05-29 NOTE — Telephone Encounter (Signed)
PT INFORMED WORD FOR WORD AND APT. IN [place]

## 2012-05-29 NOTE — Telephone Encounter (Signed)
[place] office said lipid profil

## 2012-05-29 NOTE — Progress Notes (Signed)
  Subjective:    Patient ID: Jorge Adams, male    DOB: 02-16-1951, 61 y.o.   MRN: [id number]  HPI He is here for recheck. He is scheduled for knee surgery in the near future. He does see his cardiologist regularly. He was seen in September and  blood work was ordered. It did show hemoglobin A1c of 9.9. He was seen after that and placed on metformin. His blood sugars run less than 130. Review of record indicates he was placed on testosterone over a year ago but stopped it on his own stating he didn't think that working. There is also concerned about cost. His physical activity level right now is limited due to his knee pain. He was scheduled to see the eye doctor in the near future.   Review of Systems     Objective:   Physical Exam Alert and in no distress. Hb A1c is 7.5.       Assessment & Plan:   1. Diabetes mellitus  POCT glycosylated hemoglobin (Hb A1C), POCT UA - Microalbumin  2. ASHD (arteriosclerotic heart disease)    3. Hyperlipidemia LDL goal <70    4. Hypertension associated with diabetes    5. Hypogonadism, male  Testosterone 30 MG/ACT SOLN, Testosterone  6. Obesity (BMI 30-39.9)     I will place him on Axiron. A discount card was given. He is to let me know if there is any questions about getting this medication. I will also followup on the blood work that apparently is scheduled for one month.

## 2012-05-30 LAB — TESTOSTERONE: Testosterone: 325.36 ng/dL (ref 300–890)

## 2012-06-01 ENCOUNTER — Other Ambulatory Visit: Payer: Self-pay

## 2012-06-01 DIAGNOSIS — E291 Testicular hypofunction: Secondary | ICD-10-CM

## 2012-06-01 MED ORDER — TESTOSTERONE 30 MG/ACT TD SOLN: 2.0000 "application " | Freq: Every morning | TRANSDERMAL | Status: DC

## 2012-06-01 MED ORDER — TESTOSTERONE 30 MG/ACT TD SOLN: 3.0000 "application " | Freq: Every morning | TRANSDERMAL | Status: DC

## 2012-06-01 NOTE — Telephone Encounter (Signed)
CALLED IN TESTOSTERONE FOR THE SECOND TIME  ALSO CALLED CATHY TO LET HER KNOW MED WAS CALLED IN FOR THE SECOND TIME LEFT MESSAGE

## 2012-06-01 NOTE — Telephone Encounter (Signed)
CHANGED TO 3 APPLICATION DAILY PER [facility]

## 2012-06-02 ENCOUNTER — Encounter: Payer: 59 | Attending: Family Medicine | Admitting: Dietician

## 2012-06-02 ENCOUNTER — Encounter (HOSPITAL_BASED_OUTPATIENT_CLINIC_OR_DEPARTMENT_OTHER): Payer: Self-pay | Admitting: *Deleted

## 2012-06-02 DIAGNOSIS — E669 Obesity, unspecified: Secondary | ICD-10-CM | POA: Insufficient documentation

## 2012-06-02 DIAGNOSIS — E119 Type 2 diabetes mellitus without complications: Secondary | ICD-10-CM | POA: Insufficient documentation

## 2012-06-02 DIAGNOSIS — Z713 Dietary counseling and surveillance: Secondary | ICD-10-CM | POA: Insufficient documentation

## 2012-06-02 NOTE — Pre-Procedure Instructions (Addendum)
To come for BMET and CXR EKG and office notes req. from Dr. Irven Shelling office

## 2012-06-03 ENCOUNTER — Encounter (HOSPITAL_BASED_OUTPATIENT_CLINIC_OR_DEPARTMENT_OTHER)
Admission: RE | Admit: 2012-06-03 | Discharge: 2012-06-03 | Disposition: A | Discharge status: Home / Self Care | Payer: 59 | Source: Ambulatory Visit | Attending: Orthopedic Surgery | Admitting: Orthopedic Surgery

## 2012-06-03 ENCOUNTER — Ambulatory Visit
Admission: RE | Admit: 2012-06-03 | Discharge: 2012-06-03 | Disposition: A | Discharge status: Home / Self Care | Payer: 59 | Source: Ambulatory Visit | Attending: Anesthesiology | Admitting: Anesthesiology

## 2012-06-03 LAB — BASIC METABOLIC PANEL
BUN: 12 mg/dL (ref 6–23)
CO2: 28 mEq/L (ref 19–32)
Calcium: 9.8 mg/dL (ref 8.4–10.5)
Chloride: 101 mEq/L (ref 96–112)
Creatinine, Ser: 0.88 mg/dL (ref 0.50–1.35)
Glucose, Bld: 144 mg/dL — ABNORMAL HIGH (ref 70–99)

## 2012-06-03 NOTE — H&P (Signed)
Jorge [facility] [address] [place], [place] [address] [phone] A Division of [facility]  Ninetta Lights, M.D.   Robert A. Noemi Chapel, M.D.   Faythe Casa, M.D.   Johnny Bridge, M.D.   Almedia Balls, M.D Joseph Pierini, M.D.  Gunnar Bulla, M.D.   Lanier Prude, M.D.   Verner Chol, M.D. Alyson Locket. Ricard Dillon, PA-C            Kirstin A. Shepperson, PA-C Josh Westport, PA-C [place], [place]   RE: Jorge Adams, Jorge Adams                                [id number]      DOB: 10/08/1950 INITIAL EVALUATION:  05-12-12 Kristie comes in as a new patient for evaluation and treatment recommendation for his right knee.  Insidious onset of symptoms medial aspect six months ago.  Getting steadily worse.  Locking, popping and catching.  He can no longer squat.  Pivoting very painful.  All of his symptoms are medial joint line.  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  Symptoms are starting to bother that, as well as all activities of daily living.  No previous significant knee injury or knee surgery.   Past medical history: Reviewed, updated and included in the chart.  This is positive for diabetes, on oral medication.  History of an MI with cardiac stents, followed by Dr. Einar Gip.  Surgery back in 2004.  No recurrent symptoms.  Recent follow up with Dr. Einar Gip looked very good with no current issues.          EXAMINATION: General exam is outlined and included in the chart.  Specifically, 61 year-old male.  Height: 5?11.  Weight: 233 pounds.  A little bit of an antalgic gait on the right.  Right knee point tenderness medial joint line.  Positive medial McMurray's.  No grating or crepitus.  Trace effusion.  Full motion.  Stable ligaments.  No tenderness medial joint line opposite left knee.  Neurovascularly intact distally.  Negative straight leg raise, both sides.    X-RAYS: Four view standing x-ray  really shows minimal degenerative change in either knee.    IMPRESSION: Degenerative medial meniscus tear, complex in nature, right knee.  PLAN: Worsening symptoms.  Present for more than six months already.  I really feel that he has a medial meniscus tear.  I don't think further conservative treatment is going to be beneficial based on his degree of locking and mechanical symptoms.  MRI to outline pathology.  We have to be sure he can do this with his previous stents.  If positive proceed with arthroscopy.  We have discussed that at length.  Procedures, risks, benefits and complications reviewed in detail.  All paperwork complete.  Cardiac clearance by Dr. Einar Gip.  Final decision after we see his scan.    Ninetta Lights, M.D.   Electronically verified by Ninetta Lights, M.D. DFM:[id number] Cc: Dr. Adrian Prows, fax: [phone] D 05-12-12 T 05-13-12

## 2012-06-03 NOTE — Progress Notes (Signed)
  Patient was seen on 06/02/2012 for the third of a series of three diabetes self-management courses at the [facility]. The following learning objectives were met by the patient during this course:    Describe how diabetes changes over time   Identify diabetes complications and ways to prevent them   Describe strategies that can promote heart health including lowering blood pressure and cholesterol   Describe strategies to lower dietary fat and sodium in the diet   Identify physical activities that benefit cardiovascular health   Evaluate success in meeting personal goal   Describe the belief that they can live successfully with diabetes day to day   Establish 2-3 goals that they will plan to diligently work on until they return for the free 7-month follow-up visit  The following handouts were given in class:  3 Month Follow Up Visit handout  Goal setting handout  Class evaluation form  Your patient has established the following 3 month goals for diabetes self-care:  Count Carbohydrates at most of my meals and snacks.  Be physically active 15 minutes or more 3 times a week.  Look for patterns in my record book at least 5 days per week.  Test my glucose at least 3 times a day, 7 days a week.  Follow-Up Plan: Patient will attend a 3 month follow-up visit for diabetes self-management education.

## 2012-06-04 ENCOUNTER — Ambulatory Visit (HOSPITAL_BASED_OUTPATIENT_CLINIC_OR_DEPARTMENT_OTHER)
Admission: RE | Admit: 2012-06-04 | Discharge: 2012-06-04 | Disposition: A | Discharge status: Home / Self Care | Payer: 59 | Source: Ambulatory Visit | Attending: Orthopedic Surgery | Admitting: Orthopedic Surgery

## 2012-06-04 ENCOUNTER — Encounter (HOSPITAL_BASED_OUTPATIENT_CLINIC_OR_DEPARTMENT_OTHER): Payer: Self-pay | Admitting: *Deleted

## 2012-06-04 ENCOUNTER — Ambulatory Visit (HOSPITAL_BASED_OUTPATIENT_CLINIC_OR_DEPARTMENT_OTHER): Payer: 59 | Admitting: Anesthesiology

## 2012-06-04 ENCOUNTER — Encounter (HOSPITAL_BASED_OUTPATIENT_CLINIC_OR_DEPARTMENT_OTHER): Payer: Self-pay | Admitting: Anesthesiology

## 2012-06-04 ENCOUNTER — Encounter (HOSPITAL_BASED_OUTPATIENT_CLINIC_OR_DEPARTMENT_OTHER)
Admission: RE | Disposition: A | Discharge status: Home / Self Care | Payer: Self-pay | Source: Ambulatory Visit | Attending: Orthopedic Surgery

## 2012-06-04 DIAGNOSIS — Z9889 Other specified postprocedural states: Secondary | ICD-10-CM

## 2012-06-04 DIAGNOSIS — M2340 Loose body in knee, unspecified knee: Secondary | ICD-10-CM | POA: Insufficient documentation

## 2012-06-04 DIAGNOSIS — I252 Old myocardial infarction: Secondary | ICD-10-CM | POA: Insufficient documentation

## 2012-06-04 DIAGNOSIS — E119 Type 2 diabetes mellitus without complications: Secondary | ICD-10-CM | POA: Insufficient documentation

## 2012-06-04 DIAGNOSIS — I2510 Atherosclerotic heart disease of native coronary artery without angina pectoris: Secondary | ICD-10-CM | POA: Insufficient documentation

## 2012-06-04 DIAGNOSIS — M23329 Other meniscus derangements, posterior horn of medial meniscus, unspecified knee: Secondary | ICD-10-CM | POA: Insufficient documentation

## 2012-06-04 DIAGNOSIS — M9420 Chondromalacia, unspecified site: Secondary | ICD-10-CM | POA: Insufficient documentation

## 2012-06-04 HISTORY — DX: Unspecified tear of unspecified meniscus, current injury, unspecified knee, initial encounter: S83.209A

## 2012-06-04 HISTORY — DX: Acute myocardial infarction, unspecified: I21.9

## 2012-06-04 HISTORY — PX: KNEE ARTHROSCOPY WITH MEDIAL MENISECTOMY: SHX5651

## 2012-06-04 HISTORY — DX: Complete loss of teeth, unspecified cause, unspecified class: Z97.2

## 2012-06-04 HISTORY — DX: Complete loss of teeth, unspecified cause, unspecified class: K08.109

## 2012-06-04 HISTORY — DX: Presence of coronary angioplasty implant and graft: Z95.5

## 2012-06-04 LAB — GLUCOSE, CAPILLARY: Glucose-Capillary: 107 mg/dL — ABNORMAL HIGH (ref 70–99)

## 2012-06-04 SURGERY — ARTHROSCOPY, KNEE, WITH MEDIAL MENISCECTOMY
Anesthesia: General | Site: Knee | Laterality: Right | Wound class: Clean

## 2012-06-04 MED ORDER — OXYCODONE HCL 5 MG/5ML PO SOLN: 5.0000 mg | Freq: Once | ORAL | Status: DC | PRN

## 2012-06-04 MED ORDER — MORPHINE SULFATE 4 MG/ML IJ SOLN
INTRAMUSCULAR | Status: DC | PRN
  Administered 2012-06-04: 4 mg via INTRAVENOUS

## 2012-06-04 MED ORDER — MIDAZOLAM HCL 5 MG/5ML IJ SOLN
INTRAMUSCULAR | Status: DC | PRN
  Administered 2012-06-04: 2 mg via INTRAVENOUS

## 2012-06-04 MED ORDER — FENTANYL CITRATE 0.05 MG/ML IJ SOLN
INTRAMUSCULAR | Status: DC | PRN
  Administered 2012-06-04: 100 ug via INTRAVENOUS

## 2012-06-04 MED ORDER — DEXAMETHASONE SODIUM PHOSPHATE 4 MG/ML IJ SOLN
INTRAMUSCULAR | Status: DC | PRN
  Administered 2012-06-04: 10 mg via INTRAVENOUS

## 2012-06-04 MED ORDER — OXYCODONE-ACETAMINOPHEN 5-325 MG PO TABS: 1.0000 | ORAL_TABLET | Freq: Four times a day (QID) | ORAL | Status: DC | PRN

## 2012-06-04 MED ORDER — LACTATED RINGERS IV SOLN
INTRAVENOUS | Status: DC
  Administered 2012-06-04 (×2): via INTRAVENOUS

## 2012-06-04 MED ORDER — PROPOFOL 10 MG/ML IV BOLUS
INTRAVENOUS | Status: DC | PRN
  Administered 2012-06-04: 200 mg via INTRAVENOUS

## 2012-06-04 MED ORDER — OXYCODONE HCL 5 MG PO TABS: 5.0000 mg | ORAL_TABLET | Freq: Once | ORAL | Status: DC | PRN

## 2012-06-04 MED ORDER — BUPIVACAINE HCL (PF) 0.5 % IJ SOLN
INTRAMUSCULAR | Status: DC | PRN
  Administered 2012-06-04: 20 mL

## 2012-06-04 MED ORDER — SODIUM CHLORIDE 0.9 % IR SOLN
Status: DC | PRN
  Administered 2012-06-04: 6000 mL

## 2012-06-04 MED ORDER — LIDOCAINE HCL (CARDIAC) 20 MG/ML IV SOLN
INTRAVENOUS | Status: DC | PRN
  Administered 2012-06-04: 80 mg via INTRAVENOUS

## 2012-06-04 MED ORDER — ONDANSETRON HCL 4 MG/2ML IJ SOLN: 4.0000 mg | Freq: Once | INTRAMUSCULAR | Status: DC | PRN

## 2012-06-04 MED ORDER — ONDANSETRON HCL 4 MG/2ML IJ SOLN
INTRAMUSCULAR | Status: DC | PRN
  Administered 2012-06-04: 4 mg via INTRAVENOUS

## 2012-06-04 MED ORDER — CEFAZOLIN SODIUM-DEXTROSE 2-3 GM-% IV SOLR
2.0000 g | INTRAVENOUS | Status: AC
  Administered 2012-06-04: 2 g via INTRAVENOUS

## 2012-06-04 MED ORDER — HYDROMORPHONE HCL PF 1 MG/ML IJ SOLN
0.2500 mg | INTRAMUSCULAR | Status: DC | PRN
  Administered 2012-06-04: 0.5 mg via INTRAVENOUS

## 2012-06-04 SURGICAL SUPPLY — 38 items
BANDAGE ELASTIC 6 VELCRO ST LF (GAUZE/BANDAGES/DRESSINGS) ×2 IMPLANT
BLADE CUDA 5.5 (BLADE) IMPLANT
BLADE CUDA GRT WHITE 3.5 (BLADE) IMPLANT
BLADE CUTTER GATOR 3.5 (BLADE) ×2 IMPLANT
BLADE CUTTER MENIS 5.5 (BLADE) IMPLANT
BLADE GREAT WHITE 4.2 (BLADE) ×2 IMPLANT
BUR OVAL 4.0 (BURR) IMPLANT
CANISTER OMNI JUG 16 LITER (MISCELLANEOUS) ×2 IMPLANT
CANISTER SUCTION 2500CC (MISCELLANEOUS) IMPLANT
CLOTH BEACON ORANGE TIMEOUT ST (SAFETY) ×2 IMPLANT
CUTTER MENISCUS  4.2MM (BLADE)
CUTTER MENISCUS 4.2MM (BLADE) IMPLANT
DRAPE ARTHROSCOPY W/POUCH 90 (DRAPES) ×2 IMPLANT
DURAPREP 26ML APPLICATOR (WOUND CARE) ×2 IMPLANT
ELECT MENISCUS 165MM 90D (ELECTRODE) IMPLANT
ELECT REM PT RETURN 9FT ADLT (ELECTROSURGICAL)
ELECTRODE REM PT RTRN 9FT ADLT (ELECTROSURGICAL) IMPLANT
GAUZE XEROFORM 1X8 LF (GAUZE/BANDAGES/DRESSINGS) ×2 IMPLANT
GLOVE BIO SURGEON STRL SZ 6.5 (GLOVE) ×2 IMPLANT
GLOVE BIOGEL PI IND STRL 7.0 (GLOVE) ×1 IMPLANT
GLOVE BIOGEL PI IND STRL 8 (GLOVE) ×1 IMPLANT
GLOVE BIOGEL PI INDICATOR 7.0 (GLOVE) ×1
GLOVE BIOGEL PI INDICATOR 8 (GLOVE) ×1
GLOVE ORTHO TXT STRL SZ7.5 (GLOVE) ×4 IMPLANT
GOWN PREVENTION PLUS XLARGE (GOWN DISPOSABLE) ×4 IMPLANT
GOWN STRL REIN 2XL XLG LVL4 (GOWN DISPOSABLE) ×2 IMPLANT
HOLDER KNEE FOAM BLUE (MISCELLANEOUS) ×2 IMPLANT
KNEE WRAP E Z 3 GEL PACK (MISCELLANEOUS) ×2 IMPLANT
PACK ARTHROSCOPY DSU (CUSTOM PROCEDURE TRAY) ×2 IMPLANT
PACK BASIN DAY SURGERY FS (CUSTOM PROCEDURE TRAY) ×2 IMPLANT
PENCIL BUTTON HOLSTER BLD 10FT (ELECTRODE) IMPLANT
SET ARTHROSCOPY TUBING (MISCELLANEOUS) ×1
SET ARTHROSCOPY TUBING LN (MISCELLANEOUS) ×1 IMPLANT
SPONGE GAUZE 4X4 12PLY (GAUZE/BANDAGES/DRESSINGS) ×4 IMPLANT
SUT ETHILON 3 0 PS 1 (SUTURE) ×2 IMPLANT
SUT VIC AB 3-0 FS2 27 (SUTURE) IMPLANT
TOWEL OR 17X24 6PK STRL BLUE (TOWEL DISPOSABLE) ×2 IMPLANT
WATER STERILE IRR 1000ML POUR (IV SOLUTION) ×2 IMPLANT

## 2012-06-04 NOTE — Anesthesia Postprocedure Evaluation (Signed)
  Anesthesia Post-op Note  Patient: Jorge Adams  Procedure(s) Performed: Procedure(s) (LRB) with comments: KNEE ARTHROSCOPY WITH MEDIAL MENISECTOMY (Right) - RIGHT SCOPE MEDIAL MENISCECTOMY, CHONDROPLASTY, EXCISION LOOSE BODY  Patient Location: PACU  Anesthesia Type:General  Level of Consciousness: awake, alert  and oriented  Airway and Oxygen Therapy: Patient Spontanous Breathing and Patient connected to face mask oxygen  Post-op Pain: mild  Post-op Assessment: Post-op Vital signs reviewed  Post-op Vital Signs: Reviewed  Complications: No apparent anesthesia complications

## 2012-06-04 NOTE — Interval H&P Note (Signed)
History and Physical Interval Note:  06/04/2012 7:24 AM  Jorge Adams  has presented today for surgery, with the diagnosis of RIGHT KNEE: [name]  The various methods of treatment have been discussed with the patient and family. After consideration of risks, benefits and other options for treatment, the patient has consented to  Procedure(s) (LRB) with comments: KNEE ARTHROSCOPY WITH MEDIAL MENISECTOMY (Right) - RIGHT SCOPE MEDIAL MENISCECTOMY as a surgical intervention .  The patient's history has been reviewed, patient examined, no change in status, stable for surgery.  I have reviewed the patient's chart and labs.  Questions were answered to the patient's satisfaction.     Ezekial Arns F

## 2012-06-04 NOTE — Anesthesia Procedure Notes (Signed)
Procedure Name: LMA Insertion Performed by: Terrance Mass Pre-anesthesia Checklist: Patient identified, Timeout performed, Emergency Drugs available, Suction available and Patient being monitored Patient Re-evaluated:Patient Re-evaluated prior to inductionOxygen Delivery Method: Circle system utilized Preoxygenation: Pre-oxygenation with 100% oxygen Intubation Type: IV induction Ventilation: Mask ventilation with difficulty LMA: LMA with gastric port inserted LMA Size: 4.0 Number of attempts: 1 Placement Confirmation: breath sounds checked- equal and bilateral and positive ETCO2 Tube secured with: Tape Dental Injury: Teeth and Oropharynx as per pre-operative assessment

## 2012-06-04 NOTE — Brief Op Note (Signed)
06/04/2012  11:33 AM  PATIENT:  Jorge Adams  61 y.o. male  PRE-OPERATIVE DIAGNOSIS:  RIGHT KNEE: TEAR MENISCUS MEDIAL KNEE   POST-OPERATIVE DIAGNOSIS:  RIGHT KNEE: TEAR MENISCUS MEDIAL KNEE   PROCEDURE:  Procedure(s) (LRB) with comments: KNEE ARTHROSCOPY WITH MEDIAL MENISECTOMY (Right) - RIGHT SCOPE MEDIAL MENISCECTOMY, CHONDROPLASTY, EXCISION LOOSE BODY  SURGEON:  Surgeon(s) and Role:    * Ninetta Lights, MD - Primary  PHYSICIAN ASSISTANT: Benjiman Core M    ANESTHESIA:   general  EBL:  Total I/O In: 1100 [I.V.:1100] Out: -   BLOOD ADMINISTERED:none   SPECIMEN:  No Specimen  DISPOSITION OF SPECIMEN:  N/A  COUNTS:  YES  TOURNIQUET:  * No tourniquets in log * PATIENT DISPOSITION:  PACU - hemodynamically stable.

## 2012-06-04 NOTE — Transfer of Care (Signed)
Immediate Anesthesia Transfer of Care Note  Patient: Jorge Adams  Procedure(s) Performed: Procedure(s) (LRB) with comments: KNEE ARTHROSCOPY WITH MEDIAL MENISECTOMY (Right) - RIGHT SCOPE MEDIAL MENISCECTOMY, CHONDROPLASTY, EXCISION LOOSE BODY  Patient Location: PACU  Anesthesia Type:General  Level of Consciousness: awake  Airway & Oxygen Therapy: Patient Spontanous Breathing and Patient connected to face mask oxygen  Post-op Assessment: Report given to PACU RN and Post -op Vital signs reviewed and stable  Post vital signs: Reviewed and stable  Complications: No apparent anesthesia complications

## 2012-06-04 NOTE — Anesthesia Preprocedure Evaluation (Signed)
Anesthesia Evaluation  Patient identified by MRN, date of birth, ID band Patient awake    Reviewed: Allergy & Precautions, H&P , NPO status , Patient's Chart, lab work & pertinent test results  Airway       Dental   Pulmonary          Cardiovascular hypertension, Pt. on medications + CAD and + Past MI     Neuro/Psych    GI/Hepatic   Endo/Other  diabetes, Well Controlled, Type 2, Oral Hypoglycemic Agents  Renal/GU      Musculoskeletal   Abdominal   Peds  Hematology   Anesthesia Other Findings   Reproductive/Obstetrics                           Anesthesia Physical Anesthesia Plan  ASA: III  Anesthesia Plan: General   Post-op Pain Management:    Induction: Intravenous  Airway Management Planned: LMA  Additional Equipment:   Intra-op Plan:   Post-operative Plan: Extubation in OR  Informed Consent: I have reviewed the patients History and Physical, chart, labs and discussed the procedure including the risks, benefits and alternatives for the proposed anesthesia with the patient or authorized representative who has indicated his/her understanding and acceptance.   Dental advisory given  Plan Discussed with: CRNA, Anesthesiologist and Surgeon  Anesthesia Plan Comments:         Anesthesia Quick Evaluation

## 2012-06-05 ENCOUNTER — Encounter (HOSPITAL_BASED_OUTPATIENT_CLINIC_OR_DEPARTMENT_OTHER): Payer: Self-pay | Admitting: Orthopedic Surgery

## 2012-06-05 NOTE — Op Note (Signed)
NAMERAYGEN, CASTIGLIONI NO.:  [id number]  MEDICAL RECORD NO.:  [id number]  LOCATION:  [place]                          FACILITY:  [facility]  PHYSICIAN:  Ninetta Lights, M.D. DATE OF BIRTH:  Mar 31, 1951  DATE OF PROCEDURE:  06/04/2012 DATE OF DISCHARGE:                              OPERATIVE REPORT   PREOPERATIVE DIAGNOSIS:  Right knee medial meniscus tear, osteochondral loose bodies.  POSTOPERATIVE DIAGNOSES:  Right knee medial meniscus tear, osteochondral loose bodies.  Extensive tearing, middle posterior third of medial meniscus.  One large 2 smaller osteochondral loose bodies, anterior recess.  Grade 2 and 3 changes, chondromalacia weightbearing dome, medial femoral condyle.  PROCEDURE:  Right knee exam under anesthesia, arthroscopy.  Partial medial meniscectomy.  Removal of 3 large loose bodies.  Chondroplasty, medial compartment.  SURGEON:  Ninetta Lights, MD  ASSISTANT:  Alyson Locket. Ricard Dillon, [place]  ANESTHESIA:  General.  BLOOD LOSS:  Minimal.  SPECIMENS:  None.  CULTURES:  None.  COMPLICATION:  None.  DRESSINGS:  Soft compressive.  TOURNIQUET:  Not employed.  PROCEDURE:  The patient was brought to operating room, placed on the operating table in supine position.  After adequate level of anesthesia had been obtained, leg holder applied.  Leg prepped and draped in usual sterile fashion.  Two portals, 1 each medial and lateral parapatellar. Arthroscope introduced.  Knee distended and inspected.  Good patellar tracking.  Minimal changes there.  Cruciate ligaments intact.  Lateral meniscus, lateral compartment looked good.  Medial meniscus extensive complex tearing, numerous displaced flaps, middle posterior third. Taken all the way out to the rim and the back tapered into remaining meniscus, removing most of the posterior half.  Grade 2 and 3 changes on the condyle debrided to a stable surface.  There were 3 large loose bodies in front of the knee grasped  with a large grasping forceps and removed.  Entire knee examined.  No other findings appreciated. Instruments were removed.  Portals closed with nylon.  Knee injected with Depo- Medrol and Marcaine.  Portals were injected with Marcaine.  Sterile compressive dressing applied.  Anesthesia reversed.  Brought to the recovery room.  Tolerated surgery well, no complications.     Ninetta Lights, M.D.     DFM/MEDQ  D:  06/04/2012  T:  06/05/2012  Job:  [id number]

## 2012-06-05 NOTE — Op Note (Deleted)
NAMEJAJUAN, EWALT NO.:  [id number]  MEDICAL RECORD NO.:  [id number]  LOCATION:  [place]                          FACILITY:  [facility]  PHYSICIAN:  Ninetta Lights, M.D. DATE OF BIRTH:  [birth year]  DATE OF PROCEDURE:  06/04/2012 DATE OF DISCHARGE:                              OPERATIVE REPORT   PREOPERATIVE DIAGNOSIS:  Right knee medial meniscus tear, osteochondral loose bodies.  POSTOPERATIVE DIAGNOSES:  Right knee medial meniscus tear, osteochondral loose bodies.  Extensive tearing, middle posterior third of medial meniscus.  One large 2 smaller osteochondral loose bodies, anterior recess.  Grade 2 and 3 changes, chondromalacia weightbearing dome, medial femoral condyle.  PROCEDURE:  Right knee exam under anesthesia, arthroscopy.  Partial medial meniscectomy.  Removal of 3 large loose bodies.  Chondroplasty, medial compartment.  SURGEON:  Ninetta Lights, MD  ASSISTANT:  Alyson Locket. Ricard Dillon, [place]  ANESTHESIA:  General.  BLOOD LOSS:  Minimal.  SPECIMENS:  None.  CULTURES:  None.  COMPLICATION:  None.  DRESSINGS:  Soft compressive.  TOURNIQUET:  Not employed.  PROCEDURE:  The patient was brought to operating room, placed on the operating table in supine position.  After adequate level of anesthesia had been obtained, leg holder applied.  Leg prepped and draped in usual sterile fashion.  Two portals, 1 each medial and lateral parapatellar. Arthroscope introduced.  Knee distended and inspected.  Good patellar tracking.  Minimal changes there.  Cruciate ligaments intact.  Lateral meniscus, lateral compartment looked good.  Medial meniscus extensive complex tearing, numerous displaced flaps, middle posterior third. Taken all the way out to the rim and the back tapered into remaining meniscus, removing most of the posterior half.  Grade 2 and 3 changes on the condyle debrided to a stable surface.  There were 3 large loose bodies in front of the knee grasped  with a large grasping forceps and removed.  Entire knee examined.  No other findings appreciated. Instruments were removed.  Portals closed with nylon.  Knee injected with Depo- Medrol and Marcaine.  Portals were injected with Marcaine.  Sterile compressive dressing applied.  Anesthesia reversed.  Brought to the recovery room.  Tolerated surgery well, no complications.     Ninetta Lights, M.D.     DFM/MEDQ  D:  06/04/2012  T:  06/05/2012  Job:  [id number]

## 2012-08-24 ENCOUNTER — Encounter: Payer: Self-pay | Admitting: Family Medicine

## 2012-08-24 ENCOUNTER — Ambulatory Visit (INDEPENDENT_AMBULATORY_CARE_PROVIDER_SITE_OTHER): Payer: BC Managed Care – PPO | Admitting: Family Medicine

## 2012-08-24 VITALS — BP 134/90 | HR 72 | Wt 247.0 lb

## 2012-08-24 DIAGNOSIS — I2510 Atherosclerotic heart disease of native coronary artery without angina pectoris: Secondary | ICD-10-CM

## 2012-08-24 DIAGNOSIS — E119 Type 2 diabetes mellitus without complications: Secondary | ICD-10-CM

## 2012-08-24 DIAGNOSIS — E669 Obesity, unspecified: Secondary | ICD-10-CM

## 2012-08-24 DIAGNOSIS — E291 Testicular hypofunction: Secondary | ICD-10-CM

## 2012-08-24 DIAGNOSIS — E1159 Type 2 diabetes mellitus with other circulatory complications: Secondary | ICD-10-CM

## 2012-08-24 DIAGNOSIS — E1169 Type 2 diabetes mellitus with other specified complication: Secondary | ICD-10-CM

## 2012-08-24 DIAGNOSIS — I10 Essential (primary) hypertension: Secondary | ICD-10-CM

## 2012-08-24 DIAGNOSIS — E785 Hyperlipidemia, unspecified: Secondary | ICD-10-CM

## 2012-08-24 DIAGNOSIS — I152 Hypertension secondary to endocrine disorders: Secondary | ICD-10-CM

## 2012-08-24 NOTE — Progress Notes (Signed)
  Subjective:    Patient ID: Jorge Adams, male    DOB: 1950-08-14, 62 y.o.   MRN: [id number]  HPI Is here for a recheck. He has been checking his blood sugars twice per day and they been running in the 120 range. He continues on his metformin. He has not had an eye  exam recently. He does intermittently check his feet. He also takes testosterone and is having no difficulty with this. He has had no chest pain and has not been using his nitroglycerin. His exercise has been minimal due to recent knee surgery and subsequent pain. He is doing much better in that regard and is now back at work. He has gone to diabetes education classes and has another class scheduled.   Review of Systems     Objective:   Physical Exam Alert and in no distress. Hemoglobin A1c is 7.4.       Assessment & Plan:   1. ASHD (arteriosclerotic heart disease)   2. Diabetes mellitus   3. Hypertension associated with diabetes   4. Hyperlipidemia LDL goal <70   5. Obesity (BMI 30-39.9)   6. Hypogonadism, male    discussed the fact that all the problems that he is dealing with are all interrelated and deal with his lifestyle. Strongly encouraged him to make major lifestyle modifications in regard to diet and exercise. Discussed the fact that if he does this many the problems will be greatly reduced.

## 2012-08-25 LAB — POCT GLYCOSYLATED HEMOGLOBIN (HGB A1C): Hemoglobin A1C: 7.4

## 2012-08-25 NOTE — Addendum Note (Signed)
Addended by: Randel Books on: 08/25/2012 08:26 AM   Modules accepted: Orders

## 2012-09-03 ENCOUNTER — Ambulatory Visit: Payer: 59 | Admitting: Dietician

## 2012-09-17 ENCOUNTER — Encounter: Payer: BC Managed Care – PPO | Attending: Family Medicine | Admitting: Dietician

## 2012-09-17 NOTE — Progress Notes (Signed)
  Patient was seen on 09/18/2011 for their 3 month follow-up as a part of the diabetes self-management courses at the [facility]. The following learning objectives were met by your patient during this course:  Patient self reports the following:  Diabetes control has improved since diabetes self-management training: yes Number of days blood glucose is >200: none Last MD appointment for diabetes: 08/25/2012 Changes in treatment plan: None Confidence with ability to manage diabetes: Feels confident Areas for improvement with diabetes self-care: Wants to lose weight Willingness to participate in diabetes support group: Group meeting interferes with work schedule  Diabetes Self-care Goals:  Count Carbohydrates at most of my meals and snacks.  Trying to do this.  Be physically active 15 minutes or more 3 times per week. Had knee surgery and has re-injured the knee he had surgery on.  Look for glucose patterns in log book.  Suggested that he start to check a post-meal glucose after dinner, at least 2-3 times per week.   Test blood glucose 3 times per day.  Testing 1 time per day, usually in afternoon after he gets off of work at 2:30-3:00 PM.   Please see Diabetes Flow sheet for findings related to patient's self-care.  Follow-Up Plan: Patient is eligible for a "free" 30 minute diabetes self-care appointment in the next year. Patient to call and schedule as needed.

## 2012-09-20 ENCOUNTER — Encounter: Payer: Self-pay | Admitting: Dietician

## 2012-10-10 ENCOUNTER — Other Ambulatory Visit: Payer: Self-pay | Admitting: Family Medicine

## 2012-10-22 ENCOUNTER — Other Ambulatory Visit: Payer: Self-pay | Admitting: Family Medicine

## 2012-10-22 ENCOUNTER — Telehealth: Payer: Self-pay | Admitting: Family Medicine

## 2012-10-22 MED ORDER — GEMFIBROZIL 600 MG PO TABS: ORAL_TABLET | ORAL | Status: DC

## 2012-10-22 NOTE — Telephone Encounter (Signed)
[facility] req refill for Gemfibrozil 600 mg tab 1 po bid

## 2012-10-26 ENCOUNTER — Ambulatory Visit (INDEPENDENT_AMBULATORY_CARE_PROVIDER_SITE_OTHER): Payer: BC Managed Care – PPO | Admitting: Family Medicine

## 2012-10-26 ENCOUNTER — Encounter: Payer: Self-pay | Admitting: Family Medicine

## 2012-10-26 VITALS — BP 140/92 | HR 62 | Wt 245.0 lb

## 2012-10-26 DIAGNOSIS — E119 Type 2 diabetes mellitus without complications: Secondary | ICD-10-CM

## 2012-10-26 DIAGNOSIS — I10 Essential (primary) hypertension: Secondary | ICD-10-CM

## 2012-10-26 DIAGNOSIS — Z79899 Other long term (current) drug therapy: Secondary | ICD-10-CM

## 2012-10-26 DIAGNOSIS — E291 Testicular hypofunction: Secondary | ICD-10-CM

## 2012-10-26 DIAGNOSIS — E785 Hyperlipidemia, unspecified: Secondary | ICD-10-CM

## 2012-10-26 DIAGNOSIS — E669 Obesity, unspecified: Secondary | ICD-10-CM

## 2012-10-26 DIAGNOSIS — I152 Hypertension secondary to endocrine disorders: Secondary | ICD-10-CM

## 2012-10-26 DIAGNOSIS — I2510 Atherosclerotic heart disease of native coronary artery without angina pectoris: Secondary | ICD-10-CM

## 2012-10-26 DIAGNOSIS — Z125 Encounter for screening for malignant neoplasm of prostate: Secondary | ICD-10-CM

## 2012-10-26 DIAGNOSIS — Z9119 Patient's noncompliance with other medical treatment and regimen: Secondary | ICD-10-CM

## 2012-10-26 DIAGNOSIS — E1169 Type 2 diabetes mellitus with other specified complication: Secondary | ICD-10-CM

## 2012-10-26 LAB — CBC WITH DIFFERENTIAL/PLATELET
Eosinophils Absolute: 0.3 10*3/uL (ref 0.0–0.7)
Eosinophils Relative: 4 % (ref 0–5)
HCT: 47.3 % (ref 39.0–52.0)
Lymphocytes Relative: 23 % (ref 12–46)
Lymphs Abs: 1.3 10*3/uL (ref 0.7–4.0)
MCH: 28.7 pg (ref 26.0–34.0)
MCV: 83.3 fL (ref 78.0–100.0)
Monocytes Absolute: 0.5 10*3/uL (ref 0.1–1.0)
RBC: 5.68 MIL/uL (ref 4.22–5.81)
RDW: 15.3 % (ref 11.5–15.5)
WBC: 5.9 10*3/uL (ref 4.0–10.5)

## 2012-10-26 LAB — COMPREHENSIVE METABOLIC PANEL
BUN: 11 mg/dL (ref 6–23)
CO2: 27 mEq/L (ref 19–32)
Calcium: 9.6 mg/dL (ref 8.4–10.5)
Chloride: 98 mEq/L (ref 96–112)
Creat: 0.71 mg/dL (ref 0.50–1.35)
Total Bilirubin: 0.7 mg/dL (ref 0.3–1.2)

## 2012-10-26 LAB — LIPID PANEL
Cholesterol: 175 mg/dL (ref 0–200)
HDL: 44 mg/dL (ref >39)
Total CHOL/HDL Ratio: 4 Ratio

## 2012-10-26 NOTE — Progress Notes (Signed)
Subjective:    Jorge Adams is a 62 y.o. male who presents for follow-up of Type 2 diabetes mellitus.    Home blood sugar records: fasting range: 160  Current symptoms/problems include none and have   been stable. Daily foot checks, foot concerns:  Last eye exam:     Medication compliance:good Current diet: in general, an "unhealthy" diet Current exercise: no regular exercise Known diabetic complications: impotence and cardiovascular disease Cardiovascular risk factors: advanced age (older than 69 for men, 81 for women), diabetes mellitus, dyslipidemia, hypertension, male gender, obesity (BMI >= 30 kg/m2) and sedentary lifestyle   The following portions of the patient's history were reviewed and updated as appropriate: allergies, past family history, past surgical history and problem list. He was seen recently by his cardiologist. He admits to not exercising regularly and blames this on his work schedule. He also admits to dietary indiscretion. Smoking and drinking were reviewed. He continues on testosterone. He did have questions concerning risks from this medication.  ROS as in subjective above    Objective:    BP 140/92  Pulse 62  Wt 245 lb (111.131 kg)  BMI 34.19 kg/m2  Filed Vitals:   10/26/12 0832  BP: 140/92  Pulse: 62    General appearence: alert, no distress, WD/WN Neck: supple, no lymphadenopathy, no thyromegaly, no masses Heart: RRR, normal S1, S2, no murmurs Lungs: CTA bilaterally, no wheezes, rhonchi, or rales Abdomen: +bs, soft, non tender, non distended, no masses, no hepatomegaly, no splenomegaly Pulses: 2+ symmetric, upper and lower extremities, normal cap refill Ext: no edema Foot exam:  Neuro: foot monofilament exam normal   Lab Review Lab Results  Component Value Date   HGBA1C 7.4 08/25/2012   No results found for this basename: CHOL, HDL, LDLCALC, LDLDIRECT, TRIG, CHOLHDL   No results found for this basenameDerl Barrow    Chemistry      Component Value Date/Time   NA 137 06/03/2012 1100   K 4.2 06/03/2012 1100   CL 101 06/03/2012 1100   CO2 28 06/03/2012 1100   BUN 12 06/03/2012 1100   CREATININE 0.88 06/03/2012 1100      Component Value Date/Time   CALCIUM 9.8 06/03/2012 1100        Chemistry      Component Value Date/Time   NA 137 06/03/2012 1100   K 4.2 06/03/2012 1100   CL 101 06/03/2012 1100   CO2 28 06/03/2012 1100   BUN 12 06/03/2012 1100   CREATININE 0.88 06/03/2012 1100      Component Value Date/Time   CALCIUM 9.8 06/03/2012 1100       Last optometry/ophthalmology exam reviewed from:    Assessment:   Encounter Diagnoses  Name Primary?  . ASHD (arteriosclerotic heart disease) Yes  . Diabetes mellitus   . Hypogonadism, male   . Hypertension associated with diabetes   . Hyperlipidemia LDL goal <70   . Obesity (BMI 30-39.9)   . Encounter for long-term (current) use of other medications   . Special screening for malignant neoplasm of prostate   . Personal history of noncompliance with medical treatment, presenting hazards to health         Plan:    1.  Rx changes: Discussed diet and exercise in detail with him 2.  Education: Reviewed 'ABCs' of diabetes management (respective goals in parentheses):  A1C (<7), blood pressure (<130/80), and cholesterol (LDL <100). 3.  Compliance at present is estimated to be poor. Efforts to improve compliance (if  necessary) will be directed at dietary modifications: to, increased exercise and regular blood sugar monitoring: daily. 4. Follow up: 4 months   The use of testosterone was discussed with him as well as risks and benefits. I also had a long discussion with him concerning his activity level. Encouraged him to take some time out of his busy work day to include exercise. We also discussed dietary modification. I essentially told him that his present lifestyle is putting him at risk for heart attack and potentially death.

## 2012-10-27 LAB — PSA: PSA: 0.39 ng/mL (ref <=4.00)

## 2012-10-27 LAB — TESTOSTERONE: Testosterone: 198 ng/dL — ABNORMAL LOW (ref 300–890)

## 2012-10-27 NOTE — Progress Notes (Signed)
Quick Note:  Labs are okay except testosterone. Have him go to 4 pumps per day and recheck this in one month ______

## 2012-10-27 NOTE — Progress Notes (Signed)
Quick Note:  Called pt to inform him of labs word for word (Labs are okay except testosterone. Have him go to 4 pumps per day and recheck this in one month) pt verbalized understanding ______

## 2012-11-11 ENCOUNTER — Telehealth: Payer: Self-pay | Admitting: Family Medicine

## 2012-11-11 DIAGNOSIS — E291 Testicular hypofunction: Secondary | ICD-10-CM

## 2012-11-11 MED ORDER — TESTOSTERONE 30 MG/ACT TD SOLN: 4.0000 | TRANSDERMAL | Status: DC

## 2012-11-16 ENCOUNTER — Telehealth: Payer: Self-pay | Admitting: Family Medicine

## 2012-11-16 NOTE — Telephone Encounter (Signed)
LM

## 2012-11-16 NOTE — Telephone Encounter (Signed)
PT TRIED ANDROGEL 09/26/10 & TESTIM 10/04/10, TESTOSTERONE LEVEL 198 ON 10/26/12

## 2012-12-28 ENCOUNTER — Ambulatory Visit: Payer: BC Managed Care – PPO | Admitting: Family Medicine

## 2013-01-19 ENCOUNTER — Telehealth: Payer: Self-pay | Admitting: Family Medicine

## 2013-01-19 ENCOUNTER — Other Ambulatory Visit: Payer: Self-pay | Admitting: Family Medicine

## 2013-01-25 NOTE — Telephone Encounter (Signed)
FYI

## 2013-02-18 ENCOUNTER — Other Ambulatory Visit: Payer: Self-pay | Admitting: Family Medicine

## 2013-02-24 ENCOUNTER — Ambulatory Visit: Payer: BC Managed Care – PPO | Admitting: Family Medicine

## 2013-02-26 ENCOUNTER — Ambulatory Visit: Payer: BC Managed Care – PPO | Admitting: Family Medicine

## 2013-03-16 ENCOUNTER — Other Ambulatory Visit: Payer: Self-pay | Admitting: Family Medicine

## 2013-04-11 ENCOUNTER — Other Ambulatory Visit: Payer: Self-pay | Admitting: Family Medicine

## 2013-04-23 ENCOUNTER — Ambulatory Visit: Payer: BC Managed Care – PPO | Admitting: Family Medicine

## 2013-04-26 ENCOUNTER — Other Ambulatory Visit: Payer: Self-pay | Admitting: Family Medicine

## 2013-05-27 ENCOUNTER — Ambulatory Visit (INDEPENDENT_AMBULATORY_CARE_PROVIDER_SITE_OTHER): Payer: BC Managed Care – PPO | Admitting: Family Medicine

## 2013-05-27 ENCOUNTER — Encounter: Payer: Self-pay | Admitting: Family Medicine

## 2013-05-27 ENCOUNTER — Other Ambulatory Visit: Payer: Self-pay

## 2013-05-27 VITALS — BP 150/100 | HR 90 | Wt 237.0 lb

## 2013-05-27 DIAGNOSIS — IMO0001 Reserved for inherently not codable concepts without codable children: Secondary | ICD-10-CM

## 2013-05-27 DIAGNOSIS — E291 Testicular hypofunction: Secondary | ICD-10-CM

## 2013-05-27 DIAGNOSIS — I10 Essential (primary) hypertension: Secondary | ICD-10-CM

## 2013-05-27 DIAGNOSIS — E1159 Type 2 diabetes mellitus with other circulatory complications: Secondary | ICD-10-CM

## 2013-05-27 DIAGNOSIS — E669 Obesity, unspecified: Secondary | ICD-10-CM

## 2013-05-27 DIAGNOSIS — E785 Hyperlipidemia, unspecified: Secondary | ICD-10-CM

## 2013-05-27 DIAGNOSIS — I152 Hypertension secondary to endocrine disorders: Secondary | ICD-10-CM

## 2013-05-27 DIAGNOSIS — Z9119 Patient's noncompliance with other medical treatment and regimen: Secondary | ICD-10-CM

## 2013-05-27 DIAGNOSIS — E1169 Type 2 diabetes mellitus with other specified complication: Secondary | ICD-10-CM

## 2013-05-27 LAB — POCT UA - MICROALBUMIN
Albumin/Creatinine Ratio, Urine, POC: >189.8
Microalbumin Ur, POC: >300 mg/L

## 2013-05-27 LAB — POCT GLYCOSYLATED HEMOGLOBIN (HGB A1C): Hemoglobin A1C: 10

## 2013-05-27 MED ORDER — CANAGLIFLOZIN 100 MG PO TABS: 1.0000 | ORAL_TABLET | ORAL | Status: DC

## 2013-05-27 MED ORDER — METFORMIN HCL 1000 MG PO TABS: ORAL_TABLET | ORAL | Status: DC

## 2013-05-27 NOTE — Patient Instructions (Addendum)
Look over the information from the nutritionist. Increase her physical activities at least 15 minutes everyday. Get off you're dead ass! Take 4 of the 500 mg pills until they run out and then switch Get your blood sugar before you eat closer to 100 ,2 hours after you read under 180 If you don't see your blood sugars come down in the next month give me a call

## 2013-05-27 NOTE — Progress Notes (Signed)
Subjective:    Jorge Adams is a 62 y.o. male who presents for follow-up of Type 2 diabetes mellitus.    Home blood sugar records: AVG 200  Current symptoms/problems  THIRST Daily foot checks:  Any foot concerns:  NONE Last eye exam:  [facility] 11/14   Medication compliance:good Current diet: TRYING CUTTING BACK Current exercise: WALKING AT WORK AND HOME Known diabetic complications: nephropathy and cardiovascular disease Cardiovascular risk factors: advanced age (older than 53 for men, 3 for women), diabetes mellitus, dyslipidemia, hypertension, male gender, microalbuminuria, obesity (BMI >= 30 kg/m2) and sedentary lifestyle   The following portions of the patient's history were reviewed and updated as appropriate: allergies, current medications, past family history, past medical history, past social history, past surgical history and problem list.  ROS as in subjective above    Objective:    There were no vitals taken for this visit.  There were no vitals filed for this visit.  General appearence: alert, no distress, WD/WN Neck: supple, no lymphadenopathy, no thyromegaly, no masses Heart: RRR, normal S1, S2, no murmurs Lungs: CTA bilaterally, no wheezes, rhonchi, or rales Abdomen: +bs, soft, non tender, non distended, no masses, no hepatomegaly, no splenomegaly Pulses: 2+ symmetric, upper and lower extremities, normal cap refill Ext: no edema Foot exam:  Neuro: foot monofilament exam normal   Lab Review Lab Results  Component Value Date   HGBA1C 7.4 08/25/2012   Lab Results  Component Value Date   CHOL 175 10/26/2012   HDL 44 10/26/2012   LDLCALC 90 10/26/2012   TRIG 204* 10/26/2012   CHOLHDL 4.0 10/26/2012   No results found for this basenameDerl Barrow     Chemistry      Component Value Date/Time   NA 135 10/26/2012 0903   K 4.2 10/26/2012 0903   CL 98 10/26/2012 0903   CO2 27 10/26/2012 0903   BUN 11 10/26/2012 0903   CREATININE 0.71 10/26/2012 0903   CREATININE 0.88 06/03/2012 1100      Component Value Date/Time   CALCIUM 9.6 10/26/2012 0903   ALKPHOS 69 10/26/2012 0903   AST 25 10/26/2012 0903   ALT 40 10/26/2012 0903   BILITOT 0.7 10/26/2012 0903        Chemistry      Component Value Date/Time   NA 135 10/26/2012 0903   K 4.2 10/26/2012 0903   CL 98 10/26/2012 0903   CO2 27 10/26/2012 0903   BUN 11 10/26/2012 0903   CREATININE 0.71 10/26/2012 0903   CREATININE 0.88 06/03/2012 1100      Component Value Date/Time   CALCIUM 9.6 10/26/2012 0903   ALKPHOS 69 10/26/2012 0903   AST 25 10/26/2012 0903   ALT 40 10/26/2012 0903   BILITOT 0.7 10/26/2012 0903         Assessment:  Type II or unspecified type diabetes mellitus without mention of complication, uncontrolled - Plan: POCT glycosylated hemoglobin (Hb A1C), POCT UA - Microalbumin, Canagliflozin (INVOKANA) 100 MG TABS, metFORMIN (GLUCOPHAGE) 1000 MG tablet  Diabetes with renal manifestations(250.4)  Personal history of noncompliance with medical treatment, presenting hazards to health  Obesity (BMI 30-39.9)  Hypogonadism, male - Plan: Testosterone  Hypertension associated with diabetes  Hyperlipidemia LDL goal <70  Type II or unspecified type diabetes mellitus with other specified manifestations, not stated as uncontrolled          Plan:    1.  Rx changes: He will start Invokana and increase metformin as mentioned above 2.  Education: Reviewed 'ABCs' of diabetes management (respective goals in parentheses):  A1C (<7), blood pressure (<130/80), and cholesterol (LDL <100). 3.  Compliance at present is estimated to be poor. Efforts to improve compliance (if necessary) will be directed at increased exercise and regular blood sugar monitoring: daily. 4. Follow up: 4 months  I informed him that he is showing evidence of kidney disease and explained that there is a great risk of kidney failure and also mentioned retinopathy to him. Explained that he needs to do much better job of taking  care of his diabetes to protect his eyes and kidneys. He seemed to have recognized the need to do this. He will look up information given to him by the nutritionist. Information was given to him in the AV S. concerning his blood sugars and to call me if he notes no improvement after starting the Invokana and increasing his metformin.

## 2013-05-28 LAB — TESTOSTERONE: Testosterone: 185 ng/dL — ABNORMAL LOW (ref 300–890)

## 2013-06-01 ENCOUNTER — Other Ambulatory Visit: Payer: Self-pay

## 2013-06-01 MED ORDER — TESTOSTERONE 20.25 MG/ACT (1.62%) TD GEL: 4.0000 | Freq: Every day | TRANSDERMAL | Status: DC

## 2013-06-01 NOTE — Progress Notes (Signed)
DR.LALONDE I CALLED HIS INS. COMPANY AND THEY WILL COVER ANDROGEL 1.62% TESTIM, AND FORTESTA BUT THEY WILL ALL REQUIRE PRIOR AUTHS.

## 2013-06-01 NOTE — Progress Notes (Signed)
Quick Note:  PATIENT SAID TO GO AHEAD AND CALL IN NEW TESTOSTERONE ______

## 2013-06-01 NOTE — Telephone Encounter (Signed)
CALLED IN ANDROGEL BECAUSE AXIRON WAS NOT WORKING PRIOR AUTH WILL BE REQUIRED

## 2013-06-02 LAB — HM DIABETES EYE EXAM

## 2013-06-04 ENCOUNTER — Telehealth: Payer: Self-pay | Admitting: Family Medicine

## 2013-06-04 DIAGNOSIS — H2510 Age-related nuclear cataract, unspecified eye: Secondary | ICD-10-CM

## 2013-06-04 DIAGNOSIS — H53009 Unspecified amblyopia, unspecified eye: Secondary | ICD-10-CM

## 2013-06-04 DIAGNOSIS — H40059 Ocular hypertension, unspecified eye: Secondary | ICD-10-CM

## 2013-06-04 DIAGNOSIS — E1139 Type 2 diabetes mellitus with other diabetic ophthalmic complication: Secondary | ICD-10-CM

## 2013-06-07 NOTE — Telephone Encounter (Signed)
P.A. Approved for Androgel til 07/21/38, pt informed.  Faxed pharmacy

## 2013-06-11 ENCOUNTER — Other Ambulatory Visit: Payer: Self-pay | Admitting: Family Medicine

## 2013-07-01 ENCOUNTER — Encounter: Payer: Self-pay | Admitting: Internal Medicine

## 2013-07-01 ENCOUNTER — Encounter: Payer: Self-pay | Admitting: Family Medicine

## 2013-07-18 ENCOUNTER — Other Ambulatory Visit: Payer: Self-pay | Admitting: Family Medicine

## 2013-07-19 ENCOUNTER — Other Ambulatory Visit: Payer: Self-pay | Admitting: Family Medicine

## 2013-07-27 ENCOUNTER — Other Ambulatory Visit: Payer: Self-pay | Admitting: Family Medicine

## 2013-08-15 ENCOUNTER — Other Ambulatory Visit: Payer: Self-pay | Admitting: Family Medicine

## 2013-08-30 ENCOUNTER — Other Ambulatory Visit: Payer: Self-pay | Admitting: Family Medicine

## 2013-09-24 ENCOUNTER — Ambulatory Visit: Payer: BC Managed Care – PPO | Admitting: Family Medicine

## 2013-09-30 ENCOUNTER — Other Ambulatory Visit: Payer: Self-pay | Admitting: Family Medicine

## 2013-10-19 ENCOUNTER — Other Ambulatory Visit: Payer: Self-pay | Admitting: Family Medicine

## 2013-10-26 ENCOUNTER — Ambulatory Visit: Payer: BC Managed Care – PPO | Admitting: Family Medicine

## 2013-10-29 ENCOUNTER — Encounter: Payer: Self-pay | Admitting: Family Medicine

## 2013-10-29 ENCOUNTER — Ambulatory Visit (INDEPENDENT_AMBULATORY_CARE_PROVIDER_SITE_OTHER): Payer: BC Managed Care – PPO | Admitting: Family Medicine

## 2013-10-29 VITALS — BP 108/68 | HR 78 | Temp 98.0°F | Ht 71.0 in | Wt 227.5 lb

## 2013-10-29 DIAGNOSIS — E119 Type 2 diabetes mellitus without complications: Secondary | ICD-10-CM

## 2013-10-29 DIAGNOSIS — E1159 Type 2 diabetes mellitus with other circulatory complications: Secondary | ICD-10-CM

## 2013-10-29 DIAGNOSIS — N521 Erectile dysfunction due to diseases classified elsewhere: Secondary | ICD-10-CM

## 2013-10-29 DIAGNOSIS — N529 Male erectile dysfunction, unspecified: Secondary | ICD-10-CM

## 2013-10-29 DIAGNOSIS — E1169 Type 2 diabetes mellitus with other specified complication: Secondary | ICD-10-CM

## 2013-10-29 DIAGNOSIS — E291 Testicular hypofunction: Secondary | ICD-10-CM

## 2013-10-29 DIAGNOSIS — I152 Hypertension secondary to endocrine disorders: Secondary | ICD-10-CM

## 2013-10-29 DIAGNOSIS — E785 Hyperlipidemia, unspecified: Secondary | ICD-10-CM

## 2013-10-29 DIAGNOSIS — I10 Essential (primary) hypertension: Secondary | ICD-10-CM

## 2013-10-29 DIAGNOSIS — I2510 Atherosclerotic heart disease of native coronary artery without angina pectoris: Secondary | ICD-10-CM

## 2013-10-29 DIAGNOSIS — Z125 Encounter for screening for malignant neoplasm of prostate: Secondary | ICD-10-CM

## 2013-10-29 DIAGNOSIS — I798 Other disorders of arteries, arterioles and capillaries in diseases classified elsewhere: Secondary | ICD-10-CM

## 2013-10-29 MED ORDER — NITROGLYCERIN 0.4 MG SL SUBL: 0.4000 mg | SUBLINGUAL_TABLET | SUBLINGUAL | Status: DC | PRN

## 2013-10-29 NOTE — Patient Instructions (Signed)
Recheck your labs in about 2 weeks.  Fasting, AM lab visit.  Stop the zetia in the meantime.   We'll go from there.  Take care.  Glad to see you.

## 2013-10-29 NOTE — Progress Notes (Signed)
Pre visit review using our clinic review tool, if applicable. No additional management support is needed unless otherwise documented below in the visit note.  Diabetes:  Using medications without difficulties:yes Hypoglycemic episodes:no Hyperglycemic episodes:no Feet problems:no Blood Sugars averaging: ~130 eye exam within last year: yes Due for labs.   Hypertension:    Using medication without problems or lightheadedness: yes Chest pain with exertion:no Edema:no Short of breath:no  ED.  H/o low T, replacement ongoing w/o much help.  D/w pt about options and cautions re: T replacement.  We agreed to check his labs and if wnl, the we can consider stopping T replacement as it wouldn't have been having an effect at the appropriate level.   PMH and SH reviewed.   Vital signs, Meds and allergies reviewed.  ROS: See HPI.  Otherwise nontributory.   GEN: nad, alert and oriented HEENT: mucous membranes moist NECK: supple w/o LA CV: rrr.  PULM: ctab, no inc wob ABD: soft, +bs EXT: no edema SKIN: no acute rash

## 2013-10-31 ENCOUNTER — Encounter: Payer: Self-pay | Admitting: Family Medicine

## 2013-10-31 DIAGNOSIS — E1169 Type 2 diabetes mellitus with other specified complication: Secondary | ICD-10-CM | POA: Insufficient documentation

## 2013-10-31 DIAGNOSIS — N521 Erectile dysfunction due to diseases classified elsewhere: Secondary | ICD-10-CM

## 2013-10-31 DIAGNOSIS — E1159 Type 2 diabetes mellitus with other circulatory complications: Secondary | ICD-10-CM | POA: Insufficient documentation

## 2013-10-31 NOTE — Assessment & Plan Note (Signed)
He can't afford his zetia.  He'll stop that and we'll recheck his labs in the meantime.

## 2013-10-31 NOTE — Assessment & Plan Note (Signed)
D/w pt.  Return for labs.  No change in meds.  Controlled today.

## 2013-10-31 NOTE — Assessment & Plan Note (Signed)
No sig elevations on home checks.  D/w pt.  Return for labs.  No change in meds.

## 2013-10-31 NOTE — Assessment & Plan Note (Signed)
With replacement ongoing w/o much help.  D/w pt about options and cautions re: T replacement.  We agreed to check his labs and if wnl, the we can consider stopping T replacement as it wouldn't have been having an effect at the appropriate level.  Return for labs.

## 2013-11-09 ENCOUNTER — Telehealth: Payer: Self-pay

## 2013-11-09 ENCOUNTER — Encounter (HOSPITAL_COMMUNITY): Payer: Self-pay | Admitting: Pharmacy Technician

## 2013-11-09 NOTE — Telephone Encounter (Signed)
Relevant patient education assigned to patient using Emmi. ° °

## 2013-11-12 ENCOUNTER — Other Ambulatory Visit: Payer: BC Managed Care – PPO

## 2013-11-12 ENCOUNTER — Other Ambulatory Visit (INDEPENDENT_AMBULATORY_CARE_PROVIDER_SITE_OTHER): Payer: BC Managed Care – PPO

## 2013-11-12 DIAGNOSIS — Z125 Encounter for screening for malignant neoplasm of prostate: Secondary | ICD-10-CM

## 2013-11-12 DIAGNOSIS — I2510 Atherosclerotic heart disease of native coronary artery without angina pectoris: Secondary | ICD-10-CM

## 2013-11-12 DIAGNOSIS — E291 Testicular hypofunction: Secondary | ICD-10-CM

## 2013-11-12 DIAGNOSIS — E119 Type 2 diabetes mellitus without complications: Secondary | ICD-10-CM

## 2013-11-12 LAB — HEMOGLOBIN A1C: Hgb A1c MFr Bld: 7.1 % — ABNORMAL HIGH (ref 4.6–6.5)

## 2013-11-12 LAB — LIPID PANEL
CHOL/HDL RATIO: 4
CHOLESTEROL: 176 mg/dL (ref 0–200)
HDL: 40.1 mg/dL (ref >39.00)
LDL Cholesterol: 96 mg/dL (ref 0–99)
Triglycerides: 202 mg/dL — ABNORMAL HIGH (ref 0.0–149.0)
VLDL: 40.4 mg/dL — ABNORMAL HIGH (ref 0.0–40.0)

## 2013-11-12 LAB — CBC WITH DIFFERENTIAL/PLATELET
BASOS PCT: 0.5 % (ref 0.0–3.0)
Basophils Absolute: 0 10*3/uL (ref 0.0–0.1)
EOS PCT: 4.2 % (ref 0.0–5.0)
Eosinophils Absolute: 0.3 10*3/uL (ref 0.0–0.7)
HEMATOCRIT: 46.6 % (ref 39.0–52.0)
HEMOGLOBIN: 15.5 g/dL (ref 13.0–17.0)
LYMPHS ABS: 1.5 10*3/uL (ref 0.7–4.0)
Lymphocytes Relative: 24 % (ref 12.0–46.0)
MCHC: 33.3 g/dL (ref 30.0–36.0)
MCV: 86.9 fl (ref 78.0–100.0)
MONOS PCT: 8.4 % (ref 3.0–12.0)
Monocytes Absolute: 0.5 10*3/uL (ref 0.1–1.0)
Neutro Abs: 3.9 10*3/uL (ref 1.4–7.7)
Neutrophils Relative %: 62.9 % (ref 43.0–77.0)
PLATELETS: 182 10*3/uL (ref 150.0–400.0)
RBC: 5.36 Mil/uL (ref 4.22–5.81)
RDW: 16 % — ABNORMAL HIGH (ref 11.5–14.6)
WBC: 6.2 10*3/uL (ref 4.5–10.5)

## 2013-11-12 LAB — COMPREHENSIVE METABOLIC PANEL
ALBUMIN: 4.1 g/dL (ref 3.5–5.2)
ALT: 27 U/L (ref 0–53)
AST: 19 U/L (ref 0–37)
Alkaline Phosphatase: 61 U/L (ref 39–117)
BUN: 13 mg/dL (ref 6–23)
CALCIUM: 10.3 mg/dL (ref 8.4–10.5)
CHLORIDE: 102 meq/L (ref 96–112)
CO2: 24 meq/L (ref 19–32)
Creatinine, Ser: 0.8 mg/dL (ref 0.4–1.5)
GFR: 106.37 mL/min (ref >60.00)
GLUCOSE: 122 mg/dL — AB (ref 70–99)
POTASSIUM: 5.3 meq/L — AB (ref 3.5–5.1)
Sodium: 136 mEq/L (ref 135–145)
Total Bilirubin: 0.7 mg/dL (ref 0.3–1.2)
Total Protein: 7.1 g/dL (ref 6.0–8.3)

## 2013-11-12 LAB — TESTOSTERONE: Testosterone: 175.2 ng/dL — ABNORMAL LOW (ref 350.00–890.00)

## 2013-11-12 LAB — PSA: PSA: 0.47 ng/mL (ref 0.10–4.00)

## 2013-11-15 ENCOUNTER — Other Ambulatory Visit: Payer: Self-pay | Admitting: Family Medicine

## 2013-11-15 ENCOUNTER — Encounter: Payer: Self-pay | Admitting: *Deleted

## 2013-11-15 DIAGNOSIS — E875 Hyperkalemia: Secondary | ICD-10-CM

## 2013-11-17 ENCOUNTER — Encounter: Payer: Self-pay | Admitting: Family Medicine

## 2013-11-17 ENCOUNTER — Other Ambulatory Visit: Payer: Self-pay | Admitting: Family Medicine

## 2013-11-17 DIAGNOSIS — E875 Hyperkalemia: Secondary | ICD-10-CM

## 2013-11-17 MED ORDER — SODIUM POLYSTYRENE SULFONATE 15 GM/60ML PO SUSP: 15.0000 g | Freq: Every day | ORAL | Status: DC

## 2013-11-18 ENCOUNTER — Other Ambulatory Visit: Payer: BC Managed Care – PPO

## 2013-11-21 ENCOUNTER — Other Ambulatory Visit: Payer: Self-pay | Admitting: Family Medicine

## 2013-11-22 ENCOUNTER — Other Ambulatory Visit (INDEPENDENT_AMBULATORY_CARE_PROVIDER_SITE_OTHER): Payer: BC Managed Care – PPO

## 2013-11-22 ENCOUNTER — Encounter: Payer: Self-pay | Admitting: *Deleted

## 2013-11-22 ENCOUNTER — Other Ambulatory Visit: Payer: Self-pay | Admitting: Family Medicine

## 2013-11-22 DIAGNOSIS — E875 Hyperkalemia: Secondary | ICD-10-CM

## 2013-11-22 LAB — POTASSIUM: Potassium: 4.4 mEq/L (ref 3.5–5.1)

## 2013-11-23 ENCOUNTER — Encounter (HOSPITAL_COMMUNITY)
Admission: RE | Disposition: A | Discharge status: Home / Self Care | Payer: Self-pay | Source: Ambulatory Visit | Attending: Cardiology

## 2013-11-23 ENCOUNTER — Ambulatory Visit (HOSPITAL_COMMUNITY)
Admission: RE | Admit: 2013-11-23 | Discharge: 2013-11-23 | Disposition: A | Discharge status: Home / Self Care | Payer: BC Managed Care – PPO | Source: Ambulatory Visit | Attending: Cardiology | Admitting: Cardiology

## 2013-11-23 DIAGNOSIS — I700 Atherosclerosis of aorta: Secondary | ICD-10-CM | POA: Insufficient documentation

## 2013-11-23 DIAGNOSIS — E785 Hyperlipidemia, unspecified: Secondary | ICD-10-CM | POA: Insufficient documentation

## 2013-11-23 DIAGNOSIS — E119 Type 2 diabetes mellitus without complications: Secondary | ICD-10-CM | POA: Insufficient documentation

## 2013-11-23 DIAGNOSIS — I10 Essential (primary) hypertension: Secondary | ICD-10-CM | POA: Insufficient documentation

## 2013-11-23 DIAGNOSIS — N529 Male erectile dysfunction, unspecified: Secondary | ICD-10-CM | POA: Insufficient documentation

## 2013-11-23 DIAGNOSIS — I70219 Atherosclerosis of native arteries of extremities with intermittent claudication, unspecified extremity: Secondary | ICD-10-CM | POA: Insufficient documentation

## 2013-11-23 DIAGNOSIS — I2510 Atherosclerotic heart disease of native coronary artery without angina pectoris: Secondary | ICD-10-CM | POA: Insufficient documentation

## 2013-11-23 HISTORY — PX: LOWER EXTREMITY ANGIOGRAM: SHX5508

## 2013-11-23 LAB — GLUCOSE, CAPILLARY
GLUCOSE-CAPILLARY: 118 mg/dL — AB (ref 70–99)
Glucose-Capillary: 130 mg/dL — ABNORMAL HIGH (ref 70–99)

## 2013-11-23 SURGERY — ANGIOGRAM, LOWER EXTREMITY
Anesthesia: LOCAL

## 2013-11-23 MED ORDER — MIDAZOLAM HCL 2 MG/2ML IJ SOLN
INTRAMUSCULAR | Status: AC
  Filled 2013-11-23: qty 2

## 2013-11-23 MED ORDER — HYDROMORPHONE HCL PF 1 MG/ML IJ SOLN
INTRAMUSCULAR | Status: AC
  Filled 2013-11-23: qty 1

## 2013-11-23 MED ORDER — SODIUM CHLORIDE 0.9 % IV SOLN
INTRAVENOUS | Status: DC
Start: 2013-11-23 — End: 2013-11-23

## 2013-11-23 MED ORDER — METFORMIN HCL 1000 MG PO TABS: 1000.0000 mg | ORAL_TABLET | Freq: Two times a day (BID) | ORAL | Status: DC

## 2013-11-23 MED ORDER — SODIUM CHLORIDE 0.9 % IV SOLN
INTRAVENOUS | Status: DC
  Administered 2013-11-23: 08:00:00 via INTRAVENOUS

## 2013-11-23 MED ORDER — HEPARIN (PORCINE) IN NACL 2-0.9 UNIT/ML-% IJ SOLN
INTRAMUSCULAR | Status: AC
  Filled 2013-11-23: qty 1000

## 2013-11-23 MED ORDER — LIDOCAINE HCL (PF) 1 % IJ SOLN
INTRAMUSCULAR | Status: AC
  Filled 2013-11-23: qty 30

## 2013-11-23 NOTE — Discharge Instructions (Signed)
Angiography, Care After  Refer to this sheet in the next few weeks. These instructions provide you with information on caring for yourself after your procedure. Your health care provider may also give you more specific instructions. Your treatment has been planned according to current medical practices, but problems sometimes occur. Call your health care provider if you have any problems or questions after your procedure.  WHAT TO EXPECT AFTER THE PROCEDURE After your procedure, it is typical to have the following sensations:  Minor discomfort or tenderness and a small bump at the catheter insertion site. The bump should usually decrease in size and tenderness within 1 to 2 weeks.  Any bruising will usually fade within 2 to 4 weeks. HOME CARE INSTRUCTIONS   You may need to keep taking blood thinners if they were prescribed for you. Only take over-the-counter or prescription medicines for pain, fever, or discomfort as directed by your health care provider.  Do not apply powder or lotion to the site.  Do not sit in a bathtub, swimming pool, or whirlpool for 5 to 7 days.  You may shower 24 hours after the procedure. Remove the bandage (dressing) and gently wash the site with plain soap and water. Gently pat the site dry.  Inspect the site at least twice daily.  Limit your activity for the first 24 hours. Do not bend, squat, or lift anything over 10 lb (9 kg) or as directed by your health care provider.  Do not drive home if you are discharged the day of the procedure. Have someone else drive you. Follow instructions about when you can drive or return to work. SEEK MEDICAL CARE IF:  You get lightheaded when standing up.  You have drainage (other than a small amount of blood on the dressing).  You have chills.  You have a fever.  You have redness, warmth, swelling, or pain at the insertion site. SEEK IMMEDIATE MEDICAL CARE IF:   You develop chest pain or shortness of breath, feel  faint, or pass out.  You have bleeding, swelling larger than a walnut, or drainage from the catheter insertion site.  You develop pain, discoloration, coldness, or severe bruising in the leg or arm that held the catheter.  You have heavy bleeding from the site. If this happens, hold pressure on the site. MAKE SURE YOU:  Understand these instructions.  Will watch your condition.  Will get help right away if you are not doing well or get worse. Document Released: 01/24/2005 Document Revised: 03/10/2013 Document Reviewed: 11/30/2012 [facility] Patient Information 2014 [place].

## 2013-11-23 NOTE — CV Procedure (Signed)
Procedures performed: Right Femoral access Abdominal aortogram. Abdominal aortogram and crossover from right into the left femoral artery placement of catheter tip in the left femoral artery and left femoral arteriogram with distal runoff Right femoral arteriogram with distal runoff.  Right femoral arteriogram with distal runoff.   Indication: Mr. Jorge Adams is a 63 year old Caucasian male with history of known coronary artery disease, diabetes mellitus, hypertension and hyperlipidemia who has been complaining of severe claudication involving bilateral hips. He also has erectile dysfunction. Due to ongoing symptoms, suspicion for iliac stenosis versus pseudo-claudication from spinal stenosis, for definitive diagnosis is brought to the peripheral angiography suite to evaluate for peripheral anatomy and peripheral artery disease.  Peripheral arthrogram: No evidence of abdominal aneurysm. 2 renal arteries one on either side and they're widely patent.  Aortoiliac bifurcation was widely patent, there is mild atherosclerotic changes evident in the abdominal aorta. The bilateral iliac arteries also demonstrate mild luminal irregularity. Bilateral internal iliac arteries are widely patent without any high-grade stenosis.  Femoral arteriogram:  Left common femoral artery is widely patent with mild disease, left SFA has cauliflower-like, eccentric, calcified around 80% stenosis in the midsegment. Slow filling is evident throughout the left femoral artery, with slow filling throughout the vessels below the knee. There are three-vessel runoff to the above the ankle, however the peroneal artery and anterior tibial artery are probably severely diseased and ready course the ankle. The posterior tibial artery is the only vessel that reaches the ankle.  Right common femoral artery is widely patent, the right SFA has mild to moderate diffuse disease, again severe calcified. There is mild cauliflower-like lesions in  the distal SFA, much less compared to the left lower extremity. Below the right knee similar to the left below-knee vessel, there is diffuse severe disease in the peroneal and anterior tibial vessel just above the ankle. One-vessel runoff is evident again in the form of posterior tibial artery reaching the ankle and the foot.  IMPRESSIONS:  The anatomy is not suggestive of a hip claudication. Pseudo-claudication needs to be evaluated including degenerative joint disease and also spinal stenosis. Although he has significant cauliflower-like lesions in the left SFA worse in the right, he denies symptoms of calf claudication, hence continued medical therapy with respect modification is indicated.  TECHNIQUE OF PROCEDURE: Under sterile precautions using a 5-French right femoral access A 5 French Omni flush catheter was advanced via right femoral arterial access. The catheter was advanced into abdominal aorta and abdominal aortogram was performed. Same cath was utilized to cross over from the right into the left femoral artery with the help of a Versacore wire and was able to place a catheter in the left femoral artery and left lower arteriogram distal runoff was performed. The catheter was then pulled out of the body over Versacore wire. Right femoral arterial access sheath was utilized to perform right lower arthrogram distal runoff. Patient tolerated the procedure. There was no immediate complication.

## 2013-11-23 NOTE — H&P (Signed)
Please see office visit notes for complete details of HPI. No changes in the medications, no change in symptoms from the prior office visit to today. Patient understands the procedure and is willing to proceed.

## 2013-11-23 NOTE — Interval H&P Note (Signed)
History and Physical Interval Note:  11/23/2013 9:09 AM  Jorge Adams  has presented today for surgery, with the diagnosis of claudication  The various methods of treatment have been discussed with the patient and family. After consideration of risks, benefits and other options for treatment, the patient has consented to  Procedure(s): LOWER EXTREMITY ANGIOGRAM (N/A) and possible PTA as a surgical intervention .  The patient's history has been reviewed, patient examined, no change in status, stable for surgery.  I have reviewed the patient's chart and labs.  Questions were answered to the patient's satisfaction.     Laverda Page

## 2013-11-25 ENCOUNTER — Other Ambulatory Visit: Payer: Self-pay | Admitting: Family Medicine

## 2013-12-05 ENCOUNTER — Encounter: Payer: Self-pay | Admitting: Family Medicine

## 2013-12-05 DIAGNOSIS — I739 Peripheral vascular disease, unspecified: Secondary | ICD-10-CM | POA: Insufficient documentation

## 2013-12-05 DIAGNOSIS — I70219 Atherosclerosis of native arteries of extremities with intermittent claudication, unspecified extremity: Secondary | ICD-10-CM | POA: Insufficient documentation

## 2014-02-03 ENCOUNTER — Telehealth: Payer: Self-pay | Admitting: Family Medicine

## 2014-02-03 NOTE — Telephone Encounter (Signed)
No, was 108/68 in 10/2013. Thanks.

## 2014-02-03 NOTE — Telephone Encounter (Signed)
Diabetic Bundle.  Last BP was 157/81 on 12/03/13.  Do you want me to schedule follow up to recheck BP?

## 2014-03-15 ENCOUNTER — Other Ambulatory Visit: Payer: Self-pay | Admitting: Family Medicine

## 2014-04-05 ENCOUNTER — Telehealth: Payer: Self-pay | Admitting: *Deleted

## 2014-04-05 NOTE — Telephone Encounter (Signed)
Lm on pts vm requesting a call back to schedule BP check for DM bundle

## 2014-04-24 ENCOUNTER — Other Ambulatory Visit: Payer: Self-pay | Admitting: Family Medicine

## 2014-05-21 ENCOUNTER — Other Ambulatory Visit: Payer: Self-pay | Admitting: Family Medicine

## 2014-05-23 NOTE — Telephone Encounter (Signed)
Received refill request electronically from pharmacy. Last office visit 10/29/13.  Telephone message was left for patient to call to schedule an appointment. No upcoming appointment scheduled. Is it okay to refill mediations?

## 2014-05-23 NOTE — Telephone Encounter (Signed)
Sent.  Schedule DM2 f/u.  Thanks.

## 2014-05-24 NOTE — Telephone Encounter (Signed)
Spoke to patient by telephone and appointment scheduled.

## 2014-06-03 ENCOUNTER — Ambulatory Visit (INDEPENDENT_AMBULATORY_CARE_PROVIDER_SITE_OTHER): Payer: BC Managed Care – PPO | Admitting: Family Medicine

## 2014-06-03 ENCOUNTER — Encounter: Payer: Self-pay | Admitting: Family Medicine

## 2014-06-03 VITALS — BP 124/72 | HR 74 | Temp 97.7°F | Wt 230.8 lb

## 2014-06-03 DIAGNOSIS — E785 Hyperlipidemia, unspecified: Secondary | ICD-10-CM

## 2014-06-03 DIAGNOSIS — E119 Type 2 diabetes mellitus without complications: Secondary | ICD-10-CM

## 2014-06-03 LAB — LIPID PANEL
Cholesterol: 174 mg/dL (ref 0–200)
HDL: 39 mg/dL — ABNORMAL LOW (ref >39.00)
NonHDL: 135
Total CHOL/HDL Ratio: 4
Triglycerides: 244 mg/dL — ABNORMAL HIGH (ref 0.0–149.0)
VLDL: 48.8 mg/dL — ABNORMAL HIGH (ref 0.0–40.0)

## 2014-06-03 LAB — LDL CHOLESTEROL, DIRECT: LDL DIRECT: 97.3 mg/dL

## 2014-06-03 LAB — HEMOGLOBIN A1C: HEMOGLOBIN A1C: 7.1 % — AB (ref 4.6–6.5)

## 2014-06-03 NOTE — Progress Notes (Signed)
Pre visit review using our clinic review tool, if applicable. No additional management support is needed unless otherwise documented below in the visit note.  Diabetes:  Using medications without difficulties: yes Hypoglycemic episodes: no Hyperglycemic episodes: no Feet problems: no Blood Sugars averaging: 120-140 eye exam within last year: yes, 03/2014 A1c pending.   Elevated Cholesterol: Using medications without problems:yes Muscle aches: no Diet compliance: d/w pt.   Exercise: d/w pt encouraged more.  Mainly walking at work.   Meds, vitals, and allergies reviewed.   ROS: See HPI.  Otherwise negative.    GEN: nad, alert and oriented HEENT: mucous membranes moist NECK: supple w/o LA CV: rrr. PULM: ctab, no inc wob ABD: soft, +bs EXT: no edema SKIN: no acute rash  Diabetic foot exam: Normal inspection No skin breakdown No calluses  Normal DP pulses Normal sensation to light touch and monofilament Nails normal

## 2014-06-03 NOTE — Patient Instructions (Addendum)
Go to the lab on the way out.  We'll contact you with your lab report. Plan on getting a physical in about 6 months.  Take care.  Glad to see you.

## 2014-06-06 NOTE — Assessment & Plan Note (Signed)
A1c similar to prev, no change in meds, continue work on weight and diet.  See notes on labs.

## 2014-06-06 NOTE — Assessment & Plan Note (Signed)
Lipids similar to prev, no change in meds, continue work on weight and diet.  See notes on labs.

## 2014-06-30 ENCOUNTER — Encounter (HOSPITAL_COMMUNITY): Payer: Self-pay | Admitting: Cardiology

## 2014-07-12 LAB — HM DIABETES EYE EXAM

## 2014-08-08 ENCOUNTER — Other Ambulatory Visit: Payer: Self-pay | Admitting: Family Medicine

## 2014-08-14 ENCOUNTER — Other Ambulatory Visit: Payer: Self-pay | Admitting: Family Medicine

## 2014-08-18 ENCOUNTER — Encounter: Payer: Self-pay | Admitting: Family Medicine

## 2014-08-22 ENCOUNTER — Encounter: Payer: Self-pay | Admitting: Family Medicine

## 2014-10-12 ENCOUNTER — Other Ambulatory Visit: Payer: Self-pay | Admitting: Family Medicine

## 2014-10-12 NOTE — Telephone Encounter (Signed)
Is this okay last ov 09/2013

## 2014-10-23 ENCOUNTER — Other Ambulatory Visit: Payer: Self-pay | Admitting: Family Medicine

## 2014-10-24 ENCOUNTER — Other Ambulatory Visit: Payer: Self-pay | Admitting: Family Medicine

## 2014-10-25 ENCOUNTER — Telehealth: Payer: Self-pay | Admitting: *Deleted

## 2014-10-25 NOTE — Telephone Encounter (Signed)
Electronic refill request. Last Filled:   ? 11/09/2013  Not familiar with this medication, not sure if I can refill.  Please advise.

## 2014-10-25 NOTE — Telephone Encounter (Signed)
Fax received from [facility] [place] for [place] [address].  PA submitted thru [facility], awaiting response.

## 2014-10-26 NOTE — Telephone Encounter (Signed)
Sent. Thanks.   

## 2014-10-27 MED ORDER — EMPAGLIFLOZIN 10 MG PO TABS: 10.0000 mg | ORAL_TABLET | Freq: Every day | ORAL | Status: DC

## 2014-10-27 NOTE — Telephone Encounter (Signed)
Patient advised.

## 2014-10-27 NOTE — Telephone Encounter (Signed)
Change to jardiance.  Start at 10mg  a day, update me in about 1 week re: his sugars.  We may need to increase the dose, but I would start with 10mg  a day.  Thanks.   rx sent.

## 2014-10-27 NOTE — Telephone Encounter (Signed)
Received denial from [facility] stating:  Has the patient tried and had an inadequate treatment response or intolerance to the required number of formulary alternatives below? (IF YES, PLEASE DOCUMENT DRUG NAME, TRIAL YEAR AND REASON FOR FAILURE) REQUIREMENT: 2 in a class with 2 alternatives: Farxiga, Jardiance  Patient came into office this morning asking about the PA decision and says he has been out of the medication for 3 days.  Please advise.

## 2014-10-27 NOTE — Telephone Encounter (Signed)
Denial fax in your In Box.

## 2014-11-02 ENCOUNTER — Telehealth: Payer: Self-pay

## 2014-11-02 NOTE — Telephone Encounter (Signed)
Called and spoken to [place] with [facility]. Notified her of Dr Josefine Class comments. Pharmacist verbalized understanding.

## 2014-11-02 NOTE — Telephone Encounter (Signed)
He's been on both w/o ADE.  I'm okay with him taking both as long as he has not symptoms and as long as cardiology agrees.  Thanks.

## 2014-11-02 NOTE — Telephone Encounter (Signed)
Vicente Males with [facility] left v/m; Dr Damita Dunnings has prescribed gemfibrozil and Dr Einar Gip has prescribed Crestor; pt told Vicente Males he has been taking both meds but pharmacy computer indicates pt should not be taking both meds. Vicente Males will ck with Dr Irven Shelling office also.Anna request cb.

## 2014-11-15 ENCOUNTER — Other Ambulatory Visit: Payer: Self-pay | Admitting: Family Medicine

## 2014-11-24 ENCOUNTER — Other Ambulatory Visit: Payer: Self-pay | Admitting: Family Medicine

## 2014-11-24 DIAGNOSIS — E1159 Type 2 diabetes mellitus with other circulatory complications: Secondary | ICD-10-CM

## 2014-11-25 ENCOUNTER — Other Ambulatory Visit (INDEPENDENT_AMBULATORY_CARE_PROVIDER_SITE_OTHER): Payer: 59

## 2014-11-25 DIAGNOSIS — E1151 Type 2 diabetes mellitus with diabetic peripheral angiopathy without gangrene: Secondary | ICD-10-CM | POA: Diagnosis not present

## 2014-11-25 DIAGNOSIS — E1159 Type 2 diabetes mellitus with other circulatory complications: Secondary | ICD-10-CM

## 2014-11-25 LAB — COMPREHENSIVE METABOLIC PANEL
ALBUMIN: 4.2 g/dL (ref 3.5–5.2)
ALT: 37 U/L (ref 0–53)
AST: 24 U/L (ref 0–37)
Alkaline Phosphatase: 74 U/L (ref 39–117)
BUN: 13 mg/dL (ref 6–23)
CO2: 25 meq/L (ref 19–32)
CREATININE: 0.86 mg/dL (ref 0.40–1.50)
Calcium: 10.3 mg/dL (ref 8.4–10.5)
Chloride: 101 mEq/L (ref 96–112)
GFR: 94.73 mL/min (ref >60.00)
GLUCOSE: 152 mg/dL — AB (ref 70–99)
Potassium: 4.9 mEq/L (ref 3.5–5.1)
SODIUM: 136 meq/L (ref 135–145)
TOTAL PROTEIN: 7.8 g/dL (ref 6.0–8.3)
Total Bilirubin: 0.5 mg/dL (ref 0.2–1.2)

## 2014-11-25 LAB — LIPID PANEL
CHOL/HDL RATIO: 4
Cholesterol: 178 mg/dL (ref 0–200)
HDL: 40.2 mg/dL (ref >39.00)
NONHDL: 137.8
Triglycerides: 225 mg/dL — ABNORMAL HIGH (ref 0.0–149.0)
VLDL: 45 mg/dL — AB (ref 0.0–40.0)

## 2014-11-25 LAB — LDL CHOLESTEROL, DIRECT: Direct LDL: 95 mg/dL

## 2014-11-25 LAB — HEMOGLOBIN A1C: Hgb A1c MFr Bld: 7.5 % — ABNORMAL HIGH (ref 4.6–6.5)

## 2014-12-02 ENCOUNTER — Encounter: Payer: Self-pay | Admitting: Family Medicine

## 2014-12-02 ENCOUNTER — Ambulatory Visit (INDEPENDENT_AMBULATORY_CARE_PROVIDER_SITE_OTHER): Payer: 59 | Admitting: Family Medicine

## 2014-12-02 VITALS — BP 142/78 | HR 75 | Temp 98.3°F | Ht 71.0 in | Wt 227.8 lb

## 2014-12-02 DIAGNOSIS — Z0000 Encounter for general adult medical examination without abnormal findings: Secondary | ICD-10-CM

## 2014-12-02 DIAGNOSIS — E785 Hyperlipidemia, unspecified: Secondary | ICD-10-CM | POA: Diagnosis not present

## 2014-12-02 DIAGNOSIS — E1169 Type 2 diabetes mellitus with other specified complication: Secondary | ICD-10-CM

## 2014-12-02 DIAGNOSIS — Z23 Encounter for immunization: Secondary | ICD-10-CM | POA: Diagnosis not present

## 2014-12-02 DIAGNOSIS — E119 Type 2 diabetes mellitus without complications: Secondary | ICD-10-CM

## 2014-12-02 DIAGNOSIS — E1159 Type 2 diabetes mellitus with other circulatory complications: Secondary | ICD-10-CM

## 2014-12-02 DIAGNOSIS — I10 Essential (primary) hypertension: Secondary | ICD-10-CM | POA: Diagnosis not present

## 2014-12-02 DIAGNOSIS — Z7189 Other specified counseling: Secondary | ICD-10-CM

## 2014-12-02 DIAGNOSIS — I152 Hypertension secondary to endocrine disorders: Secondary | ICD-10-CM

## 2014-12-02 DIAGNOSIS — Z136 Encounter for screening for cardiovascular disorders: Secondary | ICD-10-CM

## 2014-12-02 NOTE — Progress Notes (Signed)
Pre visit review using our clinic review tool, if applicable. No additional management support is needed unless otherwise documented below in the visit note.  CPE- See plan.  Routine anticipatory guidance given to patient.  See health maintenance. Tetanus 2012 Shingles 2012 PNA 2012 Flu shot encouraged.  Colonoscopy 2012 Prostate cancer screening and PSA options (with potential risks and benefits of testing vs not testing) were discussed along with recent recs/guidelines.  He declined testing PSA at this point. Living will d/w pt.  Wife designated if patient were incapacitated.   Diet and exercise d/w pt.  "I could do better on both."   Encouraged.   AAA screening d/w pt.  Ordered.    Diabetes:  Using medications without difficulties: yes Hypoglycemic episodes:no Hyperglycemic episodes:no Feet problems:no Blood Sugars averaging: 130-150 eye exam within last year: yes A1c up, dw pt.    Hypertension:    Using medication without problems or lightheadedness: yes Chest pain with exertion:no Edema:no Short of breath:no  Elevated Cholesterol: Using medications without problems: yes Muscle aches: no Diet compliance: encouraged Exercise: encouraged  PMH and SH reviewed.   Vital signs, Meds and allergies reviewed.  ROS: See HPI.  Otherwise nontributory.   GEN: nad, alert and oriented HEENT: mucous membranes moist NECK: supple w/o LA CV: rrr.  no murmur PULM: ctab, no inc wob ABD: soft, +bs EXT: no edema SKIN: no acute rash  Diabetic foot exam: Normal inspection No skin breakdown No calluses  1+ DP pulses Normal sensation to light tough and monofilament Nails normal

## 2014-12-02 NOTE — Patient Instructions (Addendum)
Rosaria Ferries will call about your referral.  See her on the way out about the AAA screening ultrasound.   Recheck in about 6 months, labs ahead of time.  Keep working on your weight in the meantime.  Take care, glad to see you.

## 2014-12-07 DIAGNOSIS — Z7189 Other specified counseling: Secondary | ICD-10-CM | POA: Insufficient documentation

## 2014-12-07 DIAGNOSIS — Z0000 Encounter for general adult medical examination without abnormal findings: Secondary | ICD-10-CM | POA: Insufficient documentation

## 2014-12-07 NOTE — Assessment & Plan Note (Signed)
Routine anticipatory guidance given to patient. See health maintenance.  Tetanus 2012  Shingles 2012  PNA 2012  Flu shot encouraged.  Colonoscopy 2012  Prostate cancer screening and PSA options (with potential risks and benefits of testing vs not testing) were discussed along with recent recs/guidelines. He declined testing PSA at this point.  Living will d/w pt. Wife designated if patient were incapacitated.  Diet and exercise d/w pt. "I could do better on both." Encouraged.  AAA screening d/w pt. Ordered.

## 2014-12-07 NOTE — Assessment & Plan Note (Addendum)
Not controlled at [place].  Needs work on diet and exercise.  No change in meds at this point.  Will check episodically. He agrees.

## 2014-12-07 NOTE — Assessment & Plan Note (Signed)
No change in meds.  D/w pt.  Needs work on weight, diet, exercise.  Recheck in a few months.  He agrees.  Labs d/w pt.  A1c up slightly from prev.

## 2014-12-07 NOTE — Assessment & Plan Note (Signed)
With recent med change per cards (cards note reviewed re: med change).  Labs d/w pt.  Will need more work on diet and exercise.  Will recheck labs in about 2-3 months.  Ordered.

## 2014-12-12 ENCOUNTER — Other Ambulatory Visit: Payer: Self-pay | Admitting: Family Medicine

## 2014-12-14 ENCOUNTER — Telehealth: Payer: Self-pay | Admitting: Family Medicine

## 2014-12-14 NOTE — Telephone Encounter (Signed)
Patient notified as instructed by telephone and verbalized understanding.  Follow-up lab appointment scheduled. 

## 2014-12-14 NOTE — Telephone Encounter (Signed)
Please call pt.  Note from cards reviewed.  Wanted lipids rechecked in about 2 months.   I realize he has lab/OV scheduled for 05/2015.  Keep those.  Would get a lab visit, fasting, w/o an OV in about 2 months.  Orders are in.  Thanks.

## 2014-12-26 ENCOUNTER — Other Ambulatory Visit: Payer: Self-pay | Admitting: Family Medicine

## 2014-12-27 ENCOUNTER — Ambulatory Visit (INDEPENDENT_AMBULATORY_CARE_PROVIDER_SITE_OTHER): Payer: 59

## 2014-12-27 DIAGNOSIS — Z136 Encounter for screening for cardiovascular disorders: Secondary | ICD-10-CM

## 2015-02-09 ENCOUNTER — Other Ambulatory Visit (INDEPENDENT_AMBULATORY_CARE_PROVIDER_SITE_OTHER): Payer: 59

## 2015-02-09 DIAGNOSIS — E785 Hyperlipidemia, unspecified: Secondary | ICD-10-CM

## 2015-02-09 LAB — HEPATIC FUNCTION PANEL
ALBUMIN: 4.1 g/dL (ref 3.5–5.2)
ALT: 21 U/L (ref 0–53)
AST: 15 U/L (ref 0–37)
Alkaline Phosphatase: 57 U/L (ref 39–117)
BILIRUBIN DIRECT: 0.1 mg/dL (ref 0.0–0.3)
Total Bilirubin: 0.4 mg/dL (ref 0.2–1.2)
Total Protein: 6.8 g/dL (ref 6.0–8.3)

## 2015-02-09 LAB — LIPID PANEL
Cholesterol: 160 mg/dL (ref 0–200)
HDL: 38.9 mg/dL — AB (ref >39.00)
NONHDL: 121.1
Total CHOL/HDL Ratio: 4
Triglycerides: 264 mg/dL — ABNORMAL HIGH (ref 0.0–149.0)
VLDL: 52.8 mg/dL — AB (ref 0.0–40.0)

## 2015-02-09 LAB — LDL CHOLESTEROL, DIRECT: Direct LDL: 84 mg/dL

## 2015-06-02 ENCOUNTER — Other Ambulatory Visit (INDEPENDENT_AMBULATORY_CARE_PROVIDER_SITE_OTHER): Payer: 59

## 2015-06-02 ENCOUNTER — Other Ambulatory Visit: Payer: Self-pay | Admitting: Family Medicine

## 2015-06-02 DIAGNOSIS — E119 Type 2 diabetes mellitus without complications: Secondary | ICD-10-CM

## 2015-06-02 LAB — HEMOGLOBIN A1C: Hgb A1c MFr Bld: 6.8 % — ABNORMAL HIGH (ref 4.6–6.5)

## 2015-06-06 ENCOUNTER — Ambulatory Visit (INDEPENDENT_AMBULATORY_CARE_PROVIDER_SITE_OTHER): Payer: 59 | Admitting: Family Medicine

## 2015-06-06 ENCOUNTER — Encounter: Payer: Self-pay | Admitting: Family Medicine

## 2015-06-06 VITALS — BP 138/70 | HR 74 | Temp 98.3°F | Wt 232.0 lb

## 2015-06-06 DIAGNOSIS — Z23 Encounter for immunization: Secondary | ICD-10-CM | POA: Diagnosis not present

## 2015-06-06 DIAGNOSIS — E1159 Type 2 diabetes mellitus with other circulatory complications: Secondary | ICD-10-CM | POA: Diagnosis not present

## 2015-06-06 DIAGNOSIS — Z119 Encounter for screening for infectious and parasitic diseases, unspecified: Secondary | ICD-10-CM

## 2015-06-06 NOTE — Patient Instructions (Signed)
Recheck in about 6 months at a physical.  Labs ahead of time.   Keep working on your weight.   Take care.  Glad to see you.

## 2015-06-06 NOTE — Addendum Note (Signed)
Addended byCloyd Stagers B on: 06/06/2015 09:06 AM   Modules accepted: Orders

## 2015-06-06 NOTE — Progress Notes (Signed)
Diabetes:  Using medications without difficulties: yes Hypoglycemic episodes:no Hyperglycemic episodes:no Feet problems:no Blood Sugars averaging:~120s usually.   eye exam within last year: yes A1c improved.  He is cutting back on portion size.   Flu today.   Pt opts in for HCV screening with next set of labs  D/w pt re: routine screening.   He'll consider HIV screening.   Meds, vitals, and allergies reviewed.   ROS: See HPI.  Otherwise negative.    GEN: nad, alert and oriented HEENT: mucous membranes moist NECK: supple w/o LA CV: rrr. PULM: ctab, no inc wob ABD: soft, +bs, soft umbilical hernia.   EXT: no edema SKIN: no acute rash  Diabetic foot exam: Normal inspection No skin breakdown No calluses  1+ DP pulses Normal sensation to light touch and monofilament Nails normal

## 2015-06-06 NOTE — Assessment & Plan Note (Signed)
A1c improved, continue with current meds, continue work on diet and weight.  He agrees.  Recheck in about 6 months.  Labs d/w pt.

## 2015-07-20 LAB — HM DIABETES EYE EXAM

## 2015-08-10 ENCOUNTER — Encounter: Payer: Self-pay | Admitting: Family Medicine

## 2015-08-16 ENCOUNTER — Other Ambulatory Visit: Payer: Self-pay | Admitting: *Deleted

## 2015-08-16 MED ORDER — METFORMIN HCL 1000 MG PO TABS: 1000.0000 mg | ORAL_TABLET | Freq: Two times a day (BID) | ORAL | Status: DC

## 2015-08-16 NOTE — Telephone Encounter (Signed)
Received fax and pt's insurance is requesting we change Rx to a 90 day supply, done

## 2015-09-21 ENCOUNTER — Encounter: Payer: Self-pay | Admitting: Internal Medicine

## 2015-10-20 ENCOUNTER — Other Ambulatory Visit: Payer: Self-pay | Admitting: Family Medicine

## 2015-10-20 NOTE — Telephone Encounter (Signed)
Electronic refill request. Last Filled:   ? From hospital Last office visit:   06/06/15 DM  Please advise.

## 2015-10-22 NOTE — Telephone Encounter (Signed)
Okay to continue.  Sent.  Thanks. 

## 2015-11-26 ENCOUNTER — Other Ambulatory Visit: Payer: Self-pay | Admitting: Family Medicine

## 2015-11-27 ENCOUNTER — Other Ambulatory Visit: Payer: Self-pay | Admitting: Family Medicine

## 2015-11-27 DIAGNOSIS — E1159 Type 2 diabetes mellitus with other circulatory complications: Secondary | ICD-10-CM

## 2015-11-29 ENCOUNTER — Other Ambulatory Visit: Payer: Self-pay | Admitting: Family Medicine

## 2015-11-29 ENCOUNTER — Other Ambulatory Visit (INDEPENDENT_AMBULATORY_CARE_PROVIDER_SITE_OTHER): Payer: 59

## 2015-11-29 DIAGNOSIS — E1159 Type 2 diabetes mellitus with other circulatory complications: Secondary | ICD-10-CM

## 2015-11-29 DIAGNOSIS — Z119 Encounter for screening for infectious and parasitic diseases, unspecified: Secondary | ICD-10-CM

## 2015-11-29 LAB — LIPID PANEL
Cholesterol: 142 mg/dL (ref 0–200)
HDL: 40.4 mg/dL (ref >39.00)
NONHDL: 101.16
Total CHOL/HDL Ratio: 4
Triglycerides: 252 mg/dL — ABNORMAL HIGH (ref 0.0–149.0)
VLDL: 50.4 mg/dL — ABNORMAL HIGH (ref 0.0–40.0)

## 2015-11-29 LAB — LDL CHOLESTEROL, DIRECT: Direct LDL: 70 mg/dL

## 2015-11-29 LAB — COMPREHENSIVE METABOLIC PANEL
ALT: 20 U/L (ref 0–53)
AST: 15 U/L (ref 0–37)
Albumin: 4.2 g/dL (ref 3.5–5.2)
Alkaline Phosphatase: 54 U/L (ref 39–117)
BILIRUBIN TOTAL: 0.5 mg/dL (ref 0.2–1.2)
BUN: 11 mg/dL (ref 6–23)
CO2: 28 meq/L (ref 19–32)
CREATININE: 0.97 mg/dL (ref 0.40–1.50)
Calcium: 9.9 mg/dL (ref 8.4–10.5)
Chloride: 103 mEq/L (ref 96–112)
GFR: 82.19 mL/min (ref >60.00)
GLUCOSE: 166 mg/dL — AB (ref 70–99)
Potassium: 5.1 mEq/L (ref 3.5–5.1)
SODIUM: 138 meq/L (ref 135–145)
Total Protein: 7.1 g/dL (ref 6.0–8.3)

## 2015-11-29 LAB — HEMOGLOBIN A1C: HEMOGLOBIN A1C: 7.5 % — AB (ref 4.6–6.5)

## 2015-11-30 LAB — HEPATITIS C ANTIBODY: HCV Ab: NEGATIVE

## 2015-12-04 ENCOUNTER — Encounter: Payer: Self-pay | Admitting: Family Medicine

## 2015-12-04 ENCOUNTER — Ambulatory Visit (INDEPENDENT_AMBULATORY_CARE_PROVIDER_SITE_OTHER): Payer: 59 | Admitting: Family Medicine

## 2015-12-04 VITALS — BP 128/80 | HR 72 | Temp 97.7°F | Ht 71.0 in | Wt 231.5 lb

## 2015-12-04 DIAGNOSIS — E1165 Type 2 diabetes mellitus with hyperglycemia: Secondary | ICD-10-CM

## 2015-12-04 DIAGNOSIS — I10 Essential (primary) hypertension: Secondary | ICD-10-CM

## 2015-12-04 DIAGNOSIS — Z23 Encounter for immunization: Secondary | ICD-10-CM | POA: Diagnosis not present

## 2015-12-04 DIAGNOSIS — IMO0001 Reserved for inherently not codable concepts without codable children: Secondary | ICD-10-CM

## 2015-12-04 DIAGNOSIS — Z0000 Encounter for general adult medical examination without abnormal findings: Secondary | ICD-10-CM | POA: Diagnosis not present

## 2015-12-04 DIAGNOSIS — I152 Hypertension secondary to endocrine disorders: Secondary | ICD-10-CM

## 2015-12-04 DIAGNOSIS — E1159 Type 2 diabetes mellitus with other circulatory complications: Secondary | ICD-10-CM

## 2015-12-04 DIAGNOSIS — E785 Hyperlipidemia, unspecified: Secondary | ICD-10-CM

## 2015-12-04 MED ORDER — EMPAGLIFLOZIN 10 MG PO TABS: 10.0000 mg | ORAL_TABLET | Freq: Every day | ORAL | Status: DC

## 2015-12-04 NOTE — Patient Instructions (Signed)
Diet (and exercise as tolerated) with recheck A1c in about 3 months before a visit.  We'll send your labs to Dr. Einar Gip.   Take care.  Glad to see you.

## 2015-12-04 NOTE — Progress Notes (Addendum)
Pre visit review using our clinic review tool, if applicable. No additional management support is needed unless otherwise documented below in the visit note.  CPE- See plan.  Routine anticipatory guidance given to patient.  See health maintenance. Tetanus 2012 Shingles 2012 PNA 2017 Flu shot encouraged.  Colonoscopy 2012, due for f/u, d/w pt. He has been contacted by GI, with f/u to be pending after cards eval.  Prostate cancer screening and PSA options (with potential risks and benefits of testing vs not testing) were discussed along with recent recs/guidelines. He declined testing PSA at this point. Living will d/w pt. Wife designated if patient were incapacitated.  Diet and exercise d/w pt. "Not good really, I put on some weight, I don't exercise that much." Encouraged.  AAA screening prev done.   Diabetes:  Using medications without difficulties:yes Hypoglycemic episodes:no Hyperglycemic episodes:no Feet problems:no Blood Sugars averaging: usually ~140 in the AMs.   eye exam within last year:yes Weight is up, A1 up, d/w pt.    Hypertension:    Using medication without problems or lightheadedness: yes Chest pain with exertion:no Edema:no Short of breath:yes, see below Other issues: has f/u with cards pending re: SOB and lower ext doppler, given h/o claudication in the past.   Elevated Cholesterol: Using medications without problems:yes Muscle aches: no Diet compliance: see above Exercise: see above.    PMH and SH reviewed  Meds, vitals, and allergies reviewed.   ROS: Per HPI.  Unless specifically indicated otherwise in HPI, the patient denies:  General: fever. Eyes: acute vision changes ENT: sore throat Cardiovascular: chest pain Respiratory: some occ SOB with f/u with cards pending, d/w pt.  GI: vomiting GU: dysuria Musculoskeletal: acute back pain Derm: acute rash Neuro: acute motor dysfunction Psych: worsening mood Endocrine: polydipsia Heme:  bleeding Allergy: hayfever  GEN: nad, alert and oriented HEENT: mucous membranes moist NECK: supple w/o LA CV: rrr. PULM: ctab, no inc wob ABD: soft, +bs, umbilical hernia noted (not ttp) EXT: no edema SKIN: no acute rash  Diabetic foot exam: Normal inspection No skin breakdown No calluses  Dec B DP pulses, palpable but <2+ Normal sensation to light touch and monofilament Nails normal

## 2015-12-05 NOTE — Assessment & Plan Note (Signed)
Needs diet and exercise as tolerated and recheck A1c in a few months.  D/w pt.  His situation will likely not improve w/o weight loss.  Needs weight loss more than med changes at this point.  He has f/u with cardiology pending re: dec pulses in feet.

## 2015-12-05 NOTE — Assessment & Plan Note (Deleted)
Needs diet and exercise as tolerated and recheck A1c in a few months.  D/w pt.  His situation will likely not improve w/o weight loss.  Needs weight loss more than med changes at this point.

## 2015-12-05 NOTE — Assessment & Plan Note (Signed)
Needs diet and exercise as tolerated, BP controlled today, no change in meds.  D/w pt.  His situation will likely not improve w/o weight loss.

## 2015-12-05 NOTE — Assessment & Plan Note (Signed)
LDL at goal but TG up. Needs diet and exercise as tolerated.  D/w pt.  His situation will likely not improve w/o weight loss.  Needs weight loss more than med changes at this point.

## 2015-12-05 NOTE — Assessment & Plan Note (Signed)
Tetanus 2012  Shingles 2012  PNA 2017  Flu shot encouraged.  Colonoscopy 2012, due for f/u, d/w pt. He has been contacted by GI, with f/u to be pending after cards eval.  Prostate cancer screening and PSA options (with potential risks and benefits of testing vs not testing) were discussed along with recent recs/guidelines. He declined testing PSA at this point.  Living will d/w pt. Wife designated if patient were incapacitated.  Diet and exercise d/w pt. "Not good really, I put on some weight, I don't exercise that much." Encouraged.  AAA screening prev done.

## 2015-12-18 DIAGNOSIS — I209 Angina pectoris, unspecified: Secondary | ICD-10-CM | POA: Diagnosis present

## 2015-12-18 NOTE — H&P (Signed)
OFFICE VISIT NOTES COPIED TO EPIC FOR DOCUMENTATION  . History of Present Illness Laverda Page MD; Jan 04, 2016 1:26 PM) Patient words: Last O/V 11/30/15; F/U for abn nuc.  The patient is a 65 year old male who presents for a follow-up for Follow-up for Peripheral vascular disease.  Additional reasons for visit:  Follow-up for Coronary artery disease is described as the following: Patient is here for follow-up of coronary artery disease and peripheral artery disease, with a past 5-6 months has developed worsening symptoms of dyspnea on exertion and also continues to have severe hip pain left worse than the right. He states that exertional activities brings on shortness of breath associated with mild chest tightness and is relieved with rest. He is also gained about 2-4 pounds in weight. His past medical history includes coronary artery disease and angioplasty in 2000 for and one stent placed at that time.   Due to symptoms suggestive of worsening angina pectoris, underwent stress testing and echocardiogram and presents here for follow-up. His wife is present at the bedside. No new symptomatology.  He has history of diabetes mellitus type 2 which is uncontrolled, mixed hyperlipidemia with uncontrolled triglycerides, known peripheral arterial disease by angiography in 2015 revealing bilateral SFA cauliflower-like severe diffuse mixed atheresclerotic lesions with bilateral severe disease in the anterior and peroneal arteries.   Problem List/Past Medical Loletha Grayer [place]; 2016/01/04 11:03 AM) Mixed hyperlipidemia (E78.2)  Benign essential hypertension (I10)  Postsurgical percutaneous transluminal coronary angioplasty status (Z98.61)  Atherosclerosis of native artery of both lower extremities with intermittent claudication (N47.096)  Peripheral arteriogram 11/23/2013: Bilateral aortoiliac bifurcation and iliac artery showed mild disease. Bilateral SFA shows cauliflower-like severe diffuse  mixed calcific stenosis, bilateral severe diffuse disease of the anterior and peroneal arteries. Findings not consistent with bilateral hip claudication Preoperative cardiovascular examination (Z01.810)  Uncontrolled type 2 diabetes mellitus with complication, without long-term current use of insulin (E11.8, E11.65)  BMI 32.0-32.9,adult (Z68.32)  Shortness of breath on exertion (R06.02)  Angina pectoris (I20.9)  Exercise sestamibi stress test 12/08/2015: 1. The resting electrocardiogram demonstrated normal sinus rhythm, normal resting conduction, no resting arrhythmias and normal rest repolarization. The stress electrocardiogram was abnormal. There was no ST-T changes of ischemia, ut in the reovery there were frequent PVC, V-Trigeminy and ventricular couplets. Patient exercised on Bruce protocol for 4.00 minutes and achieved 4.64 METS. Stress test terminated due to dyspnea and target heart rate (94 % MPHR). 2. The perfusion imaging study demonstrates a moderate to large size inferior and inferolateral severe ischemia extending from the base towards the apex. Left ventricular systolic function calculated by QGS was in the lower limit of normal at 51% with severe inferior and lateral hypokinesis. This is a high risk study. Atherosclerosis of native coronary artery of native heart with angina pectoris (I25.119) 2004 Coronary Angiogram. 2004 Placed 1 stent  Allergies Loletha Grayer [place]; January 04, 2016 11:03 AM) Atorvastatin Calcium *ANTIHYPERLIPIDEMICS* 08/07/2010 09:37 AM Myalgia  Family History Theora Gianotti; 04-Jan-2016 11:03 AM) Mother  Deceased. at age 76; from [place] Father  Deceased. at age 59; from Pneumonia; Hx of HTN Siblings  1 Brother  Social History Theora Gianotti; 01/04/16 11:03 AM) Current tobacco use  Former smoker. Quit 2004 Non Drinker/No Alcohol Use  Marital status  Married. Number of Children  2. Living Situation  Lives with spouse.  Past Surgical History Theora Gianotti;  01/04/16 11:03 AM) Arthroscopy, knee, with arthrotomy (28366)  Right. Right knee arthroscopy Nov 2013 right knee  Medication History Theora Gianotti; January 04, 2016 11:07 AM) Leotis Shames (  5-40MG Tablet, 1 (one) Tablet Tablet Oral daily, Taken starting 09/13/2015) Active. (Failed losartan, lisinopril was stable on Azor) Fenofibrate (145MG Tablet, 1 (one) Tablet Tablet Oral every evening after dinner, Taken starting 08/23/2015) Active. Metoprolol Tartrate (50MG Tablet, 1 (one) Tablet Tablet Tablet Oral two times daily, Taken starting 08/06/2015) Active. Crestor (20MG Tablet, 1 (one) Tablet Tablet Tablet Oral daily, Taken starting 03/19/2015) Active. Cilostazol (100MG Tablet, 1 (one) Tablet Tablet Tablet Oral two times daily, Taken starting 07/26/2012) Active. Nitrostat (0.4MG Tab Sublingual, 1 tab under tongue Sublingual every 5 minutes as needed for chest pain.) Active. MetFORMIN HCl (1000MG Tablet, 1 Oral two times daily) Active. Aspirin (81MG Tablet, 1 Oral daily) Active. Jardiance (10MG Tablet, 1 Oral daily) Active. Alphagan P (0.1% Solution, 1 drop each eye Ophthalmic twice a day) Active. Travatan Z (0.004% Solution, 1 drop both eyes Ophthalmic at bedtime) Active. Dorzolamide HCl-Timolol Mal (22.3-6.8MG/ML Solution, 1 drop in both eyes Ophthalmic twice a day) Active. Medications Reconciled (verbally with patient)  Diagnostic Studies History Laverda Page, MD; 12/13/2015 1:24 PM) Nuclear stress test 12/08/2015 1. The resting electrocardiogram demonstrated normal sinus rhythm, normal resting conduction, no resting arrhythmias and normal rest repolarization. The stress electrocardiogram was abnormal. There was no ST-T changes of ischemia, ut in the reovery there were frequent PVC, V-Trigeminy and ventricular couplets. Patient exercised on Bruce protocol for 4.00 minutes and achieved 4.64 METS. Stress test terminated due to dyspnea and target heart rate (94 % MPHR). 2. The perfusion imaging  study demonstrates a moderate to large size inferior and inferolateral severe ischemia extending from the base towards the apex. Left ventricular systolic function calculated by QGS was in the lower limit of normal at 51% with severe inferior and lateral hypokinesis. This is a high risk study. Doppler Ultrasound 12/13/2015 Lower extremity arterial duplex 12/13/2015: No hemodynamically significant stenoses are identified in the right lower extremity arterial system. Moderate velocity increase at the left distal superficial femoral artery suggests > 50% stenosis. Diffuse mixed plaque noted both lower extremities. This exam reveals mildly decreased perfusion of the right lower extremity with ABI 0.96 and moderately decreased perfusion of the left lower extremity with ABI 0.69 noted at the post tibial artery level. When compared to ABI done on 07/04/2011, ABI was normal bilaterally. Echocardiogram 12/13/2015 Left ventricle cavity is normal in size. Mild concentric hypertrophy of the left ventricle. Doppler evidence of grade II (pseudonormal) diastolic dysfunction. Diastolic dysfunction findings suggests elevated LA/LV end diastolic pressure. Mild infero apical and apical lateral hypokinesis. Normal LVEF. Calculated EF 55%. Trace tricuspid regurgitation. No evidence of pulmonary hypertension.  Other Problems Loletha Grayer [place]; 12/13/2015 11:03 AM) Unspecified Diagnosis  Encounter for long-term (current) drug use (Z79.899)     Review of Systems Laverda Page, MD; 12/13/2015 1:24 PM) General Not Present- Fatigue, Fever and Night Sweats. Skin Not Present- Itching and Rash. HEENT Not Present- Headache. Respiratory Present- Decreased Exercise Tolerance and Difficulty Breathing on Exertion. Not Present- Chronic Cough and Wakes up from Sleep Wheezing or Short of Breath. Cardiovascular Present- Chest Pain and Claudications (hips bilateral). Not Present- Fainting, Orthopnea and Swelling of  Extremities. Gastrointestinal Not Present- Abdominal Pain, Constipation, Diarrhea, Nausea and Vomiting. Musculoskeletal Present- Backache and Joint Pain (bilateral hip left worse and bilateral knee right worse). Not Present- Joint Swelling. Neurological Not Present- Headaches. Psychiatric Not Present- Anxiety, Delusions and Fearful. Hematology Not Present- Blood Clots, Easy Bruising and Nose Bleed.  Vitals Loletha Grayer [place]; 12/13/2015 11:08 AM) 12/13/2015 11:04 AM Weight: 227.13 lb Height: 71in Body Surface  Area: 2.23 m Body Mass Index: 31.68 kg/m  Pulse: 76 (Regular)  P.OX: 96% (Room air) BP: 124/62 (Sitting, Left Arm, Standard)       Physical Exam Laverda Page, MD; 12/13/2015 1:24 PM) General Mental Status-Alert. General Appearance-Cooperative, Appears stated age, Not in acute distress. Orientation-Oriented X3. Build & Nutrition-Well built and Mildly obese.  Head and Neck Thyroid Gland Characteristics - no palpable nodules, no palpable enlargement.  Chest and Lung Exam Palpation Tender - No chest wall tenderness. Auscultation Breath sounds - Clear.  Cardiovascular Inspection Jugular vein - Right - No Distention. Auscultation Heart Sounds - S1 WNL, S2 WNL and No gallop present. Murmurs & Other Heart Sounds - Murmur - No murmur.  Abdomen Inspection Contour - Obese. Palpation/Percussion Normal exam - Non Tender and No hepatosplenomegaly. Auscultation Normal exam - Bowel sounds normal.  Peripheral Vascular Lower Extremity Inspection - Left - No Pigmentation, No Varicose veins. Right - No Pigmentation, No Varicose veins. Bilateral - Loss of hair, No Digital ulcers. Palpation - Edema - Left - No edema. Right - No edema. Femoral pulse - Left - Normal. Right - Normal. Popliteal pulse - Left - Normal. Right - Normal. Dorsalis pedis pulse - Bilateral - Absent. Posterior tibial pulse - Bilateral - Absent. Carotid arteries - Left-No Carotid  bruit. Carotid arteries - Right-No Carotid bruit. Abdomen-No prominent abdominal aortic pulsation, No epigastric bruit.  Neurologic Motor-Grossly intact without any focal deficits.  Musculoskeletal Global Assessment Left Lower Extremity - normal range of motion without pain. Right Lower Extremity - normal range of motion without pain.  Assessment & Plan Laverda Page MD; 12/13/2015 5:34 PM) Atherosclerosis of native coronary artery of native heart with angina pectoris (I25.119) Story: Coronary Angiogram. 2004 Placed 1 stent Impression: EKG 11/30/2015: Normal sinus rhythm at the rate of 71 bpm, normal axis. No evidence of ischemia. Low-voltage complexes. No significant change from EKG 11/30/2014. Angina pectoris (I20.9) Story: Exercise sestamibi stress test 12/08/2015: 1. The resting electrocardiogram demonstrated normal sinus rhythm, normal resting conduction, no resting arrhythmias and normal rest repolarization. The stress electrocardiogram was abnormal. There was no ST-T changes of ischemia, ut in the reovery there were frequent PVC, V-Trigeminy and ventricular couplets. Patient exercised on Bruce protocol for 4.00 minutes and achieved 4.64 METS. Stress test terminated due to dyspnea and target heart rate (94 % MPHR). 2. The perfusion imaging study demonstrates a moderate to large size inferior and inferolateral severe ischemia extending from the base towards the apex. Left ventricular systolic function calculated by QGS was in the lower limit of normal at 51% with severe inferior and lateral hypokinesis. This is a high risk study. Current Plans Started Nitrostat 0.4MG, 1 tab under tongue Tablet every 5 minutes as needed for chest pain., #25, 12/13/2015, Ref. x2. METABOLIC PANEL, BASIC (32992) CBC & PLATELETS (AUTO) (42683) PT (PROTHROMBIN TIME) (41962) Shortness of breath on exertion (R06.02) Story: Echocardiogram 12/13/2015: Left ventricle cavity is normal in size. Mild  concentric hypertrophy of the left ventricle. Doppler evidence of grade II (pseudonormal) diastolic dysfunction. Diastolic dysfunction findings suggests elevated LA/LV end diastolic pressure. Mild infero apical and apical lateral hypokinesis. Normal LVEF. Calculated EF 55%. Trace tricuspid regurgitation. No evidence of pulmonary hypertension. Atherosclerosis of native artery of both lower extremities with intermittent claudication (I29.798) Story: Peripheral arteriogram 11/23/2013: Bilateral aortoiliac bifurcation and iliac artery showed mild disease. Bilateral SFA shows cauliflower-like severe diffuse mixed calcific stenosis, bilateral severe diffuse disease of the anterior and peroneal arteries. Findings not consistent with bilateral hip claudication.  Lower extremity arterial duplex 12/13/2015: No hemodynamically significant stenoses are identified in the right lower extremity arterial system. Moderate velocity increase at the left distal superficial femoral artery suggests > 50% stenosis. Diffuse mixed plaque noted both lower extremities. This exam reveals mildly decreased perfusion of the right lower extremity with ABI 0.96 and moderately decreased perfusion of the left lower extremity with ABI 0.69 noted at the post tibial artery level. When compared to ABI done on 07/04/2011, ABI was normal bilaterally. BMI 32.0-32.9,adult (Z68.32)  Labwork Story: labs 12/13/2015: Serum glucose 116 mg, BUN 14, serum creatinine 0.95. Potassium 4.6. Hemoglobin 15.6/hematocrit 44.6, RDW 16.4. Platelets 152. Pro time normal.  Labs 11/30/2015: Serum potassium 5.1, serum glucose 166 mg, BUN 11, serum creatinine 0.97. CMP normal. EGFR 82 mL. Total cholesterol 142, diglycerides to 52, HDL 40, direct LDL 70. HbA1c 7.5%.  02/09/2015: Total cholesterol 160, triglycerides 264, HDL 39, direct LDL 84, hepatic function normal  11/25/2014: Total cholesterol 178, triglycerides 225, HDL 40, LDL 95. Non-HDL cholesterol 137, HbA1c  7.5%. CMP was normal except elevated blood sugar. Uncontrolled type 2 diabetes mellitus with complication, without long-term current use of insulin (E11.8) Current Plans Mechanism of underlying disease process and action of medications discussed with the patient. I discussed primary/secondary prevention and also dietary counceling was done. Patient's symptoms are clearly indicated to of class III angina pectoris with exertion, reviewed the results of the stress test and echocardiogram the patient and his wife at the bedside, would recommend coronary angiography due to severe large-size ischemia in the same area as his prior myocardial infarction, inferior and lateral.  With regard to left hip pain, lower extremity arterial duplex again suggest diffuse PAD, there is probably a 35 or greater percent stenosis in the left SFA, I will consider doing a limited abdominal aortogram to evaluate iliac stenosis due to persistent symptoms. If there is no significant iliac artery disease, we may have to consider orthopedic evaluation.  Schedule for cardiac catheterization, and possible angioplasty. We discussed regarding risks, benefits, alternatives to this including stress testing, CTA and continued medical therapy. Patient wants to proceed. Understands <1-2% risk of death, stroke, MI, urgent CABG, bleeding, infection, renal failure but not limited to these.  Signed by Laverda Page, MD (12/13/2015 5:34 PM)

## 2015-12-21 ENCOUNTER — Encounter (HOSPITAL_COMMUNITY)
Admission: RE | Disposition: A | Discharge status: Home / Self Care | Payer: Self-pay | Source: Ambulatory Visit | Attending: Cardiology

## 2015-12-21 ENCOUNTER — Ambulatory Visit (HOSPITAL_COMMUNITY)
Admission: RE | Admit: 2015-12-21 | Discharge: 2015-12-21 | Disposition: A | Discharge status: Home / Self Care | Payer: 59 | Source: Ambulatory Visit | Attending: Cardiology | Admitting: Cardiology

## 2015-12-21 DIAGNOSIS — Z955 Presence of coronary angioplasty implant and graft: Secondary | ICD-10-CM | POA: Diagnosis not present

## 2015-12-21 DIAGNOSIS — I10 Essential (primary) hypertension: Secondary | ICD-10-CM | POA: Insufficient documentation

## 2015-12-21 DIAGNOSIS — I2584 Coronary atherosclerosis due to calcified coronary lesion: Secondary | ICD-10-CM | POA: Insufficient documentation

## 2015-12-21 DIAGNOSIS — M25552 Pain in left hip: Secondary | ICD-10-CM | POA: Diagnosis not present

## 2015-12-21 DIAGNOSIS — E785 Hyperlipidemia, unspecified: Secondary | ICD-10-CM | POA: Insufficient documentation

## 2015-12-21 DIAGNOSIS — I2582 Chronic total occlusion of coronary artery: Secondary | ICD-10-CM | POA: Insufficient documentation

## 2015-12-21 DIAGNOSIS — Z794 Long term (current) use of insulin: Secondary | ICD-10-CM | POA: Diagnosis not present

## 2015-12-21 DIAGNOSIS — E1165 Type 2 diabetes mellitus with hyperglycemia: Secondary | ICD-10-CM | POA: Diagnosis not present

## 2015-12-21 DIAGNOSIS — I25119 Atherosclerotic heart disease of native coronary artery with unspecified angina pectoris: Secondary | ICD-10-CM | POA: Insufficient documentation

## 2015-12-21 DIAGNOSIS — E1151 Type 2 diabetes mellitus with diabetic peripheral angiopathy without gangrene: Secondary | ICD-10-CM | POA: Insufficient documentation

## 2015-12-21 DIAGNOSIS — I209 Angina pectoris, unspecified: Secondary | ICD-10-CM | POA: Diagnosis present

## 2015-12-21 HISTORY — PX: PERIPHERAL VASCULAR CATHETERIZATION: SHX172C

## 2015-12-21 HISTORY — PX: CARDIAC CATHETERIZATION: SHX172

## 2015-12-21 LAB — GLUCOSE, CAPILLARY: GLUCOSE-CAPILLARY: 133 mg/dL — AB (ref 65–99)

## 2015-12-21 SURGERY — LEFT HEART CATH AND CORONARY ANGIOGRAPHY

## 2015-12-21 MED ORDER — IOPAMIDOL (ISOVUE-370) INJECTION 76%
INTRAVENOUS | Status: AC
  Filled 2015-12-21: qty 100

## 2015-12-21 MED ORDER — NITROGLYCERIN 1 MG/10 ML FOR IR/CATH LAB
INTRA_ARTERIAL | Status: AC
  Filled 2015-12-21: qty 10

## 2015-12-21 MED ORDER — IOPAMIDOL (ISOVUE-370) INJECTION 76%
INTRAVENOUS | Status: DC | PRN
  Administered 2015-12-21: 130 mL via INTRAVENOUS

## 2015-12-21 MED ORDER — SODIUM CHLORIDE 0.9 % IV SOLN: 250.0000 mL | INTRAVENOUS | Status: DC | PRN

## 2015-12-21 MED ORDER — SODIUM CHLORIDE 0.9 % WEIGHT BASED INFUSION: 1.0000 mL/kg/h | INTRAVENOUS | Status: DC

## 2015-12-21 MED ORDER — VERAPAMIL HCL 2.5 MG/ML IV SOLN
INTRA_ARTERIAL | Status: DC | PRN
  Administered 2015-12-21: 5 mL via INTRA_ARTERIAL

## 2015-12-21 MED ORDER — SODIUM CHLORIDE 0.9% FLUSH: 3.0000 mL | INTRAVENOUS | Status: DC | PRN

## 2015-12-21 MED ORDER — HEPARIN SODIUM (PORCINE) 1000 UNIT/ML IJ SOLN
INTRAMUSCULAR | Status: AC
  Filled 2015-12-21: qty 1

## 2015-12-21 MED ORDER — LIDOCAINE HCL (PF) 1 % IJ SOLN
INTRAMUSCULAR | Status: DC | PRN
  Administered 2015-12-21: 2 mL via INTRADERMAL

## 2015-12-21 MED ORDER — IOPAMIDOL (ISOVUE-370) INJECTION 76%
INTRAVENOUS | Status: AC
  Filled 2015-12-21: qty 50

## 2015-12-21 MED ORDER — HYDROMORPHONE HCL 1 MG/ML IJ SOLN
INTRAMUSCULAR | Status: DC | PRN
  Administered 2015-12-21: 0.5 mg via INTRAVENOUS

## 2015-12-21 MED ORDER — MIDAZOLAM HCL 2 MG/2ML IJ SOLN
INTRAMUSCULAR | Status: DC | PRN
Start: 2015-12-21 — End: 2015-12-21
  Administered 2015-12-21: 2 mg via INTRAVENOUS

## 2015-12-21 MED ORDER — SODIUM CHLORIDE 0.9% FLUSH: 3.0000 mL | Freq: Two times a day (BID) | INTRAVENOUS | Status: DC

## 2015-12-21 MED ORDER — SODIUM CHLORIDE 0.9 % WEIGHT BASED INFUSION
3.0000 mL/kg/h | INTRAVENOUS | Status: AC
  Administered 2015-12-21: 3 mL/kg/h via INTRAVENOUS

## 2015-12-21 MED ORDER — LIDOCAINE HCL (PF) 1 % IJ SOLN
INTRAMUSCULAR | Status: AC
  Filled 2015-12-21: qty 30

## 2015-12-21 MED ORDER — HYDROMORPHONE HCL 1 MG/ML IJ SOLN
INTRAMUSCULAR | Status: AC
  Filled 2015-12-21: qty 1

## 2015-12-21 MED ORDER — ASPIRIN 81 MG PO CHEW
81.0000 mg | CHEWABLE_TABLET | ORAL | Status: AC
  Administered 2015-12-21: 81 mg via ORAL

## 2015-12-21 MED ORDER — HEPARIN SODIUM (PORCINE) 1000 UNIT/ML IJ SOLN
INTRAMUSCULAR | Status: DC | PRN
  Administered 2015-12-21: 8000 [IU] via INTRAVENOUS

## 2015-12-21 MED ORDER — METFORMIN HCL 1000 MG PO TABS: 1000.0000 mg | ORAL_TABLET | Freq: Two times a day (BID) | ORAL | Status: DC

## 2015-12-21 MED ORDER — ASPIRIN 81 MG PO CHEW
CHEWABLE_TABLET | ORAL | Status: AC
  Filled 2015-12-21: qty 1

## 2015-12-21 MED ORDER — MIDAZOLAM HCL 2 MG/2ML IJ SOLN
INTRAMUSCULAR | Status: AC
  Filled 2015-12-21: qty 2

## 2015-12-21 MED ORDER — SODIUM CHLORIDE 0.9 % WEIGHT BASED INFUSION: 3.0000 mL/kg/h | INTRAVENOUS | Status: AC

## 2015-12-21 MED ORDER — HEPARIN (PORCINE) IN NACL 2-0.9 UNIT/ML-% IJ SOLN
INTRAMUSCULAR | Status: DC | PRN
  Administered 2015-12-21: 1000 mL

## 2015-12-21 MED ORDER — HEPARIN (PORCINE) IN NACL 2-0.9 UNIT/ML-% IJ SOLN
INTRAMUSCULAR | Status: AC
  Filled 2015-12-21: qty 1000

## 2015-12-21 MED ORDER — VERAPAMIL HCL 2.5 MG/ML IV SOLN
INTRAVENOUS | Status: AC
  Filled 2015-12-21: qty 2

## 2015-12-21 SURGICAL SUPPLY — 11 items
CATH INFINITI 5F PIG 125CM (CATHETERS) ×2 IMPLANT
CATH OPTITORQUE TIG 4.0 5F (CATHETERS) ×2 IMPLANT
DEVICE RAD COMP TR BAND LRG (VASCULAR PRODUCTS) ×2 IMPLANT
GLIDESHEATH SLEND A-KIT 6F 20G (SHEATH) ×2 IMPLANT
KIT HEART LEFT (KITS) ×2 IMPLANT
PACK CARDIAC CATHETERIZATION (CUSTOM PROCEDURE TRAY) ×2 IMPLANT
SYR MEDRAD MARK V 150ML (SYRINGE) IMPLANT
TRANSDUCER W/STOPCOCK (MISCELLANEOUS) ×2 IMPLANT
TUBING CIL FLEX 10 FLL-RA (TUBING) ×2 IMPLANT
WIRE AMPLATZ WHISKJ .035X260CM (WIRE) IMPLANT
WIRE SAFE-T 1.5MM-J .035X260CM (WIRE) ×2 IMPLANT

## 2015-12-21 NOTE — Interval H&P Note (Signed)
History and Physical Interval Note:  12/21/2015 6:17 AM  Jorge Adams  has presented today for surgery, with the diagnosis of cad  The various methods of treatment have been discussed with the patient and family. After consideration of risks, benefits and other options for treatment, the patient has consented to  Procedure(s): Left Heart Cath and Coronary Angiography (N/A) and possible angioplasty as a surgical intervention. Will also attempt Abdominal aortogram to evaluate iliac vessels.  The patient's history has been reviewed, patient examined, no change in status, stable for surgery.  I have reviewed the patient's chart and labs.  Questions were answered to the patient's satisfaction.    Ischemic Symptoms? CCS III (Marked limitation of ordinary activity) Anti-ischemic Medical Therapy? Maximal Medical Therapy (2 or more classes of medications) Non-invasive Test Results? High-risk stress test findings: cardiac mortality >3%/yr Prior CABG? No Previous CABG   Patient Information:   1-2V CAD, no prox LAD  A (9)  Indication: 19; Score: 9   Patient Information:   CTO of 1 vessel, no other CAD  A (8)  Indication: 29; Score: 8   Patient Information:   1V CAD with prox LAD  A (9)  Indication: 35; Score: 9   Patient Information:   2V-CAD with prox LAD  A (9)  Indication: 41; Score: 9   Patient Information:   3V-CAD without LMCA  A (9)  Indication: 47; Score: 9   Patient Information:   3V-CAD without LMCA With Abnormal LV systolic function  A (9)  Indication: 48; Score: 9   Patient Information:   LMCA-CAD  A (9)  Indication: 49; Score: 9   Patient Information:   2V-CAD with prox LAD PCI  A (7)  Indication: 62; Score: 7   Patient Information:   2V-CAD with prox LAD CABG  A (8)  Indication: 62; Score: 8   Patient Information:   3V-CAD without LMCA With Low CAD burden(i.e., 3 focal stenoses, low SYNTAX score) PCI  A (7)  Indication: 63;  Score: 7   Patient Information:   3V-CAD without LMCA With Low CAD burden(i.e., 3 focal stenoses, low SYNTAX score) CABG  A (9)  Indication: 63; Score: 9   Patient Information:   3V-CAD without LMCA E06c - Intermediate-high CAD burden (i.e., multiple diffuse lesions, presence of CTO, or high SYNTAX score) PCI  U (4)  Indication: 64; Score: 4   Patient Information:   3V-CAD without LMCA E06c - Intermediate-high CAD burden (i.e., multiple diffuse lesions, presence of CTO, or high SYNTAX score) CABG  A (9)  Indication: 64; Score: 9   Patient Information:   LMCA-CAD With Isolated LMCA stenosis  PCI  U (6)  Indication: 65; Score: 6   Patient Information:   LMCA-CAD With Isolated LMCA stenosis  CABG  A (9)  Indication: 65; Score: 9   Patient Information:   LMCA-CAD Additional CAD, low CAD burden (i.e., 1- to 2-vessel additional involvement, low SYNTAX score) PCI  U (5)  Indication: 66; Score: 5   Patient Information:   LMCA-CAD Additional CAD, low CAD burden (i.e., 1- to 2-vessel additional involvement, low SYNTAX score) CABG  A (9)  Indication: 66; Score: 9   Patient Information:   LMCA-CAD Additional CAD, intermediate-high CAD burden (i.e., 3-vessel involvement, presence of CTO, or high SYNTAX score) PCI  I (3)  Indication: 67; Score: 3   Patient Information:   LMCA-CAD Additional CAD, intermediate-high CAD burden (i.e., 3-vessel involvement, presence of CTO, or high SYNTAX score) CABG  A (9)  Indication: 67; Score: 9    Jorge Adams

## 2015-12-21 NOTE — Discharge Instructions (Signed)
Radial Site Care Refer to this sheet in the next few weeks. These instructions provide you with information about caring for yourself after your procedure. Your health care provider may also give you more specific instructions. Your treatment has been planned according to current medical practices, but problems sometimes occur. Call your health care provider if you have any problems or questions after your procedure. WHAT TO EXPECT AFTER THE PROCEDURE After your procedure, it is typical to have the following:  Bruising at the radial site that usually fades within 1-2 weeks.  Blood collecting in the tissue (hematoma) that may be painful to the touch. It should usually decrease in size and tenderness within 1-2 weeks. HOME CARE INSTRUCTIONS  Take medicines only as directed by your health care provider.  You may shower 24-48 hours after the procedure or as directed by your health care provider. Remove the bandage (dressing) and gently wash the site with plain soap and water. Pat the area dry with a clean towel. Do not rub the site, because this may cause bleeding.  Do not take baths, swim, or use a hot tub until your health care provider approves.  Check your insertion site every day for redness, swelling, or drainage.  Do not apply powder or lotion to the site.  Do not flex or bend the affected arm for 24 hours or as directed by your health care provider.  Do not push or pull heavy objects with the affected arm for 24 hours or as directed by your health care provider.  Do not lift over 10 lb (4.5 kg) for 5 days after your procedure or as directed by your health care provider.  Ask your health care provider when it is okay to:  Return to work or school.  Resume usual physical activities or sports.  Resume sexual activity.  Do not drive home if you are discharged the same day as the procedure. Have someone else drive you.  You may drive 24 hours after the procedure unless otherwise  instructed by your health care provider.  Do not operate machinery or power tools for 24 hours after the procedure.  If your procedure was done as an outpatient procedure, which means that you went home the same day as your procedure, a responsible adult should be with you for the first 24 hours after you arrive home.  Keep all follow-up visits as directed by your health care provider. This is important. SEEK MEDICAL CARE IF:  You have a fever.  You have chills.  You have increased bleeding from the radial site. Hold pressure on the site and call 911. SEEK IMMEDIATE MEDICAL CARE IF:  You have unusual pain at the radial site.  You have redness, warmth, or swelling at the radial site.  You have drainage (other than a small amount of blood on the dressing) from the radial site.  The radial site is bleeding, and the bleeding does not stop after 30 minutes of holding steady pressure on the site.  Your arm or hand becomes pale, cool, tingly, or numb.   This information is not intended to replace advice given to you by your health care provider. Make sure you discuss any questions you have with your health care provider.   Document Released: 08/10/2010 Document Revised: 07/29/2014 Document Reviewed: 01/24/2014 Elsevier Interactive Patient Education [employer].

## 2015-12-22 ENCOUNTER — Encounter (HOSPITAL_COMMUNITY): Payer: Self-pay | Admitting: Cardiology

## 2015-12-26 ENCOUNTER — Other Ambulatory Visit: Payer: Self-pay | Admitting: *Deleted

## 2015-12-26 ENCOUNTER — Encounter: Payer: Self-pay | Admitting: Cardiothoracic Surgery

## 2015-12-26 ENCOUNTER — Institutional Professional Consult (permissible substitution) (INDEPENDENT_AMBULATORY_CARE_PROVIDER_SITE_OTHER): Payer: 59 | Admitting: Cardiothoracic Surgery

## 2015-12-26 VITALS — BP 139/80 | HR 83 | Resp 16 | Ht 71.0 in | Wt 222.0 lb

## 2015-12-26 DIAGNOSIS — I70213 Atherosclerosis of native arteries of extremities with intermittent claudication, bilateral legs: Secondary | ICD-10-CM | POA: Diagnosis not present

## 2015-12-26 DIAGNOSIS — I25119 Atherosclerotic heart disease of native coronary artery with unspecified angina pectoris: Secondary | ICD-10-CM | POA: Diagnosis not present

## 2015-12-26 DIAGNOSIS — E1159 Type 2 diabetes mellitus with other circulatory complications: Secondary | ICD-10-CM | POA: Diagnosis not present

## 2015-12-26 DIAGNOSIS — I2510 Atherosclerotic heart disease of native coronary artery without angina pectoris: Secondary | ICD-10-CM

## 2015-12-26 NOTE — Progress Notes (Signed)
PCP is Elsie Stain, MD Referring Provider is Adrian Prows, MD  Chief Complaint  Patient presents with  . Coronary Artery Disease    eval for CABG...STRESS ECHO 12/08/15, CATH 12/21/15, LE ART. DUPLEX 12/13/15   Patient examined, cardiac catheterization in 2-D echocardiogram personally reviewed and discussed and counseled with patient  HPI:65 year old obese Caucasian diabetic reformed smoker with prior history of CAD status post stent to the circumflexmarginal 12 years ago presents for evaluation and treatment of recently diagnosed severe three-vessel CAD. Patient developed exertional shortness of breath and chest tightness which is increasing in frequency and intensity. He was evaluated by Dr. Duke Salvia with a stress test-perfusion study which showed an ischemic response in the inferior wall. Echocardiogram showed normal overall LV function with some hypokinesia of the inferior wall. There is no significant valvular disease. The patient underwent cardiac catheterization via right radial artery which demonstrates chronic occlusion the RCA with a graftable posterior descending, 80-90% stenosis of the OM1 with a patent stent proximally and a stenosis distal to the stent, and a 90% stenosis of the LAD. LVEDP was less than 10 mm mercury.  Because of the patient's severe three-vessel coronary disease, diabetes, and symptoms of progressive angina and the patient is felt a candidate for surgical coronary revascularization.  Patient also has history of peripheral rash or disease. Basher lab studies by Dr. Duke Salvia and indicated ABI on the left of 0.69, ABI on the right 0.96. Carotid duplex studies are pending.  The patient's only surgical history is a left knee arthroscopy under general anesthesia which he tolerated well without complication.  No family history of CABG or premature MI. Positive history for hypertension, dyslipidemia, obesity, diabetes, and he quit smoking in 2004. The patient is retired Hotel manager  for a [employer]   Past Medical History  Diagnosis Date  . ASHD (arteriosclerotic heart disease)   . Dyslipidemia   . ED (erectile dysfunction)   . Coronary atherosclerosis of native coronary artery   . Atherosclerosis of native arteries of the extremities with intermittent claudication   . Impotence of organic origin   . Hypertension     under control, has been on med. > 9 yr.  . Myocardial infarction ([place]) 10/2002  . Stented coronary artery   . Diabetes mellitus     NIDDM  . Meniscus tear 05/2012    right knee  . Full dentures     Past Surgical History  Procedure Laterality Date  . Cardiac catheterization  10/23/2002  . Colonoscopy    . Knee arthroscopy with medial menisectomy  06/04/2012    Procedure: KNEE ARTHROSCOPY WITH MEDIAL MENISECTOMY;  Surgeon: Ninetta Lights, MD;  Location: [facility];  Service: Orthopedics;  Laterality: Right;  RIGHT SCOPE MEDIAL MENISCECTOMY, CHONDROPLASTY, EXCISION LOOSE BODY  . Lower extremity angiogram N/A 11/23/2013    Procedure: LOWER EXTREMITY ANGIOGRAM;  Surgeon: Laverda Page, MD;  Location: [facility] CATH LAB;  Service: Cardiovascular;  Laterality: N/A;  . Cardiac catheterization N/A 12/21/2015    Procedure: Left Heart Cath and Coronary Angiography;  Surgeon: Adrian Prows, MD;  Location: [facility] CV LAB;  Service: Cardiovascular;  Laterality: N/A;  . Peripheral vascular catheterization N/A 12/21/2015    Procedure: Abdominal Aortogram;  Surgeon: Adrian Prows, MD;  Location: [facility] CV LAB;  Service: Cardiovascular;  Laterality: N/A;  . Peripheral vascular catheterization N/A 12/21/2015    Procedure: Abdominal Aortogram w/Lower Extremity;  Surgeon: Adrian Prows, MD;  Location: [facility] CV LAB;  Service: Cardiovascular;  Laterality: N/A;    Family History  Problem Relation Age of Onset  . Bone cancer Mother   . Pneumonia Father   . Diabetes Brother   . Colon cancer Neg Hx   . Prostate cancer Neg Hx     Social History Social  History  Substance Use Topics  . Smoking status: Former Smoker    Quit date: 12/02/2002  . Smokeless tobacco: Never Used  . Alcohol Use: Yes     Comment: rare    Current Outpatient Prescriptions  Medication Sig Dispense Refill  . amLODipine-olmesartan (AZOR) 5-40 MG tablet Take 1 tablet by mouth daily.    Marland Kitchen aspirin EC 81 MG tablet Take 81 mg by mouth daily.    . brimonidine (ALPHAGAN P) 0.1 % SOLN Place 1 drop into both eyes 2 (two) times daily.    . cilostazol (PLETAL) 100 MG tablet Take 100 mg by mouth 2 (two) times daily.  3  . dorzolamide-timolol (COSOPT) 22.3-6.8 MG/ML ophthalmic solution Place 1 drop into both eyes 2 (two) times daily.    . empagliflozin (JARDIANCE) 10 MG TABS tablet Take 10 mg by mouth daily. 30 tablet 12  . fenofibrate (TRICOR) 145 MG tablet Take 145 mg by mouth daily.    . metFORMIN (GLUCOPHAGE) 1000 MG tablet Take 1 tablet (1,000 mg total) by mouth 2 (two) times daily. 180 tablet 1  . metoprolol (LOPRESSOR) 50 MG tablet Take 50 mg by mouth 2 (two) times daily.     . nitroGLYCERIN (NITROSTAT) 0.4 MG SL tablet Place 1 tablet (0.4 mg total) under the tongue every 5 (five) minutes as needed for chest pain. 25 tablet 12  . rosuvastatin (CRESTOR) 20 MG tablet Take 20 mg by mouth daily.     . TRAVATAN Z 0.004 % SOLN ophthalmic solution Place 1 drop into both eyes at bedtime.     No current facility-administered medications for this visit.    No Known Allergies  Review of Systems  No history thoracic trauma or pneumothorax Patient is right-hand dominant No neuropathy , retinopathy from his diabetes       Review of Systems :  [ y ] = yes, [  ] = no        General :  Weight gain [   ]    Weight loss  [ yes purposely try to lose weight  ]  Fatigue [  ]  Fever [  ]  Chills  [  ]                                Weakness  [  ]           HEENT    Headache [  ]  Dizziness [  ]  Blurred vision [  ] Glaucoma  [  ]                          Nosebleeds [  ] Painful or  loose teeth [  ]total dental plates upper and lower        Cardiac :  Chest pain/ pressure Jorge Adams  ]  Resting SOB [  ] exertional SOB Jorge Adams  ]                        Pontianus.Latina [  ]  Pedal edema  [  ]  Palpitations [  ]  Syncope/presyncope '[ ]'$                         Paroxysmal nocturnal dyspnea [  ]         Pulmonary : cough [  ]  wheezing [  ]  Hemoptysis [  ] Sputum [  ] Snoring [  ]                              Pneumothorax [  ]  Sleep apnea [  ]        GI : Vomiting [  ]  Dysphagia [  ]  Melena  [  ]  Abdominal pain [  ] BRBPR [  ]              Heart burn [  ]  Constipation [  ] Diarrhea  [  ] Colonoscopy [   ] history of non-repaired inguinal hernia        GU : Hematuria [  ]  Dysuria [  ]  Nocturia Jorge Adams  ] UTI's [  ]        Vascular : Claudication [  ]  Rest pain [  ]  DVT [  ] Vein stripping [  ] leg ulcers [  ]                          TIA [  ] Stroke [  ]  Varicose veins [  ]        NEURO :  Headaches  [  ] Seizures [  ] Vision changes [  ] Paresthesias [  ]                                       Seizures [  ]        Musculoskeletal :  Arthritis [yes left knee  ] Gout  [  ]  Back pain [  ]  Joint pain [  ]        Skin :  Rash [  ]  Melanoma [  ] Sores [  ]        Heme : Bleeding problems [  ]Clotting Disorders [  ] Anemia [  ]Blood Transfusion '[ ]'$         Endocrine : Diabetes [  Yes type II on oral meds] Heat or Cold intolerance [  ] Polyuria [  ]excessive thirst '[ ]'$         Psych : Depression [  ]  Anxiety [  ]  Psych hospitalizations [  ] Memory change [  ]                                               BP 139/80 mmHg  Pulse 83  Resp 16  Ht '5\' 11"'$  (1.803 m)  Wt 222 lb (100.699 kg)  BMI 30.98 kg/m2  SpO2 96% Physical Exam     Physical Exam  General: pleasant well-developed but obese Caucasian male no acute distress accompanied by his wife HEENT: Normocephalic pupils equal , dentition with full dentures Neck: Supple without JVD, adenopathy, or bruit  Chest: Clear to  auscultation, symmetrical breath sounds, no rhonchi, no tenderness             or deformity Cardiovascular: Regular rate and rhythm, no murmur, no gallop, peripheral pulses           not  palpable in lower extremities. No hematoma in right wrist from cardiac catheterization Abdomen:  Soft, obese, nontender, no palpable mass or organomegaly Extremities: Warm, well-perfused, no clubbing cyanosis edema or tenderness,              no venous stasis changes of the legs Rectal/GU: Deferred Neuro: Grossly non--focal and symmetrical throughout Skin: Clean and dry without rash or ulceration  Diagnostic Tests: Cardiac catheterization demonstrates severe three-vessel CAD with preserved LV systolic function Carotid duplex studies are pending  Impression: Progressive class III angina with severe three-vessel CAD and a diabetic patient with preserved LV function His best long-term therapy would be multivessel CABG with targets being the LAD, OM one, and posterior descending branch of the distal RCA  Plan:CABG  Scheduled  at [facility] on June 12. Indications benefits alternatives and risks of surgery have been discussed in detail the patient and wife. He demonstrates understanding and agrees to proceed. He understands the risks include but are not limited to stroke, bleeding, blood transfusion requirement, postoperative wound infection, postoperative lung problems including pleural effusion, postoperative arrhythmia including bradycardia requiring pacemaker, death.   Len Childs, MD [facility] [phone]

## 2015-12-28 ENCOUNTER — Ambulatory Visit (HOSPITAL_COMMUNITY): Payer: 59

## 2015-12-29 ENCOUNTER — Ambulatory Visit (HOSPITAL_COMMUNITY): Payer: 59

## 2015-12-29 ENCOUNTER — Inpatient Hospital Stay (HOSPITAL_COMMUNITY): Admission: RE | Admit: 2015-12-29 | Payer: Medicare Other | Source: Ambulatory Visit

## 2015-12-29 ENCOUNTER — Encounter (HOSPITAL_COMMUNITY): Payer: Medicare Other

## 2016-01-01 ENCOUNTER — Ambulatory Visit (HOSPITAL_COMMUNITY)
Admission: RE | Admit: 2016-01-01 | Discharge: 2016-01-01 | Disposition: A | Discharge status: Home / Self Care | Payer: 59 | Source: Ambulatory Visit | Attending: Cardiothoracic Surgery | Admitting: Cardiothoracic Surgery

## 2016-01-01 ENCOUNTER — Ambulatory Visit (HOSPITAL_BASED_OUTPATIENT_CLINIC_OR_DEPARTMENT_OTHER)
Admission: RE | Admit: 2016-01-01 | Discharge: 2016-01-01 | Disposition: A | Discharge status: Home / Self Care | Payer: 59 | Source: Ambulatory Visit | Attending: Cardiothoracic Surgery | Admitting: Cardiothoracic Surgery

## 2016-01-01 ENCOUNTER — Encounter (HOSPITAL_COMMUNITY): Payer: Self-pay

## 2016-01-01 ENCOUNTER — Encounter (HOSPITAL_COMMUNITY)
Admission: RE | Admit: 2016-01-01 | Discharge: 2016-01-01 | Disposition: A | Discharge status: Home / Self Care | Payer: 59 | Source: Ambulatory Visit | Attending: Cardiothoracic Surgery | Admitting: Cardiothoracic Surgery

## 2016-01-01 VITALS — BP 142/67 | HR 76 | Temp 97.6°F | Resp 18 | Ht 71.0 in | Wt 224.7 lb

## 2016-01-01 DIAGNOSIS — R001 Bradycardia, unspecified: Secondary | ICD-10-CM

## 2016-01-01 DIAGNOSIS — I6523 Occlusion and stenosis of bilateral carotid arteries: Secondary | ICD-10-CM

## 2016-01-01 DIAGNOSIS — Z01818 Encounter for other preprocedural examination: Secondary | ICD-10-CM

## 2016-01-01 DIAGNOSIS — I2510 Atherosclerotic heart disease of native coronary artery without angina pectoris: Secondary | ICD-10-CM

## 2016-01-01 DIAGNOSIS — E119 Type 2 diabetes mellitus without complications: Secondary | ICD-10-CM | POA: Insufficient documentation

## 2016-01-01 DIAGNOSIS — Z0183 Encounter for blood typing: Secondary | ICD-10-CM | POA: Insufficient documentation

## 2016-01-01 DIAGNOSIS — Z01812 Encounter for preprocedural laboratory examination: Secondary | ICD-10-CM

## 2016-01-01 HISTORY — DX: Unspecified disorder of eye and adnexa: H57.9

## 2016-01-01 HISTORY — DX: Unspecified disorder of eye and adnexa: H40.50X1

## 2016-01-01 LAB — PULMONARY FUNCTION TEST
DL/VA % pred: 98 %
DL/VA: 4.6 ml/min/mmHg/L
DLCO unc % pred: 79 %
DLCO unc: 26.74 ml/min/mmHg
FEF 25-75 Post: 3.73 L/sec
FEF 25-75 Pre: 2.86 L/sec
FEF2575-%Change-Post: 30 %
FEF2575-%Pred-Post: 136 %
FEF2575-%Pred-Pre: 104 %
FEV1-%Change-Post: 9 %
FEV1-%Pred-Post: 87 %
FEV1-%Pred-Pre: 80 %
FEV1-Post: 3.07 L
FEV1-Pre: 2.81 L
FEV1FVC-%Change-Post: 0 %
FEV1FVC-%Pred-Pre: 108 %
FEV6-%Change-Post: 9 %
FEV6-%Pred-Post: 85 %
FEV6-%Pred-Pre: 77 %
FEV6-Post: 3.83 L
FEV6-Pre: 3.49 L
FEV6FVC-%Change-Post: 0 %
FEV6FVC-%Pred-Post: 105 %
FEV6FVC-%Pred-Pre: 104 %
FVC-%Change-Post: 9 %
FVC-%Pred-Post: 81 %
FVC-%Pred-Pre: 74 %
FVC-Post: 3.84 L
FVC-Pre: 3.51 L
Post FEV1/FVC ratio: 80 %
Post FEV6/FVC ratio: 100 %
Pre FEV1/FVC ratio: 80 %
Pre FEV6/FVC Ratio: 99 %
RV % pred: 117 %
RV: 2.84 L
TLC % pred: 91 %
TLC: 6.62 L

## 2016-01-01 LAB — URINALYSIS, ROUTINE W REFLEX MICROSCOPIC
Bilirubin Urine: NEGATIVE
Glucose, UA: >1000 mg/dL — AB
Hgb urine dipstick: NEGATIVE
Ketones, ur: NEGATIVE mg/dL
Leukocytes, UA: NEGATIVE
Nitrite: NEGATIVE
Protein, ur: NEGATIVE mg/dL
Specific Gravity, Urine: 1.015 (ref 1.005–1.030)
pH: 7 (ref 5.0–8.0)

## 2016-01-01 LAB — BLOOD GAS, ARTERIAL
Acid-base deficit: 1.4 mmol/L (ref 0.0–2.0)
Bicarbonate: 22.5 mEq/L (ref 20.0–24.0)
Drawn by: 421801
FIO2: 0.21
O2 Saturation: 96.5 %
Patient temperature: 98.6
TCO2: 23.6 mmol/L (ref 0–100)
pCO2 arterial: 36.3 mmHg (ref 35.0–45.0)
pH, Arterial: 7.41 (ref 7.350–7.450)
pO2, Arterial: 80.8 mmHg (ref 80.0–100.0)

## 2016-01-01 LAB — ABO/RH: ABO/RH(D): O POS

## 2016-01-01 LAB — CBC
HCT: 46.6 % (ref 39.0–52.0)
Hemoglobin: 15.4 g/dL (ref 13.0–17.0)
MCH: 27.7 pg (ref 26.0–34.0)
MCHC: 33 g/dL (ref 30.0–36.0)
MCV: 84 fL (ref 78.0–100.0)
Platelets: 130 10*3/uL — ABNORMAL LOW (ref 150–400)
RBC: 5.55 MIL/uL (ref 4.22–5.81)
RDW: 14.9 % (ref 11.5–15.5)
WBC: 5.3 10*3/uL (ref 4.0–10.5)

## 2016-01-01 LAB — COMPREHENSIVE METABOLIC PANEL
ALT: 30 U/L (ref 17–63)
AST: 22 U/L (ref 15–41)
Albumin: 3.8 g/dL (ref 3.5–5.0)
Alkaline Phosphatase: 58 U/L (ref 38–126)
Anion gap: 10 (ref 5–15)
BUN: 10 mg/dL (ref 6–20)
CO2: 22 mmol/L (ref 22–32)
Calcium: 9.6 mg/dL (ref 8.9–10.3)
Chloride: 107 mmol/L (ref 101–111)
Creatinine, Ser: 0.71 mg/dL (ref 0.61–1.24)
GFR calc Af Amer: >60 mL/min (ref >60)
GFR calc non Af Amer: >60 mL/min (ref >60)
Glucose, Bld: 114 mg/dL — ABNORMAL HIGH (ref 65–99)
Potassium: 3.9 mmol/L (ref 3.5–5.1)
Sodium: 139 mmol/L (ref 135–145)
Total Bilirubin: 0.4 mg/dL (ref 0.3–1.2)
Total Protein: 7.1 g/dL (ref 6.5–8.1)

## 2016-01-01 LAB — TYPE AND SCREEN
ABO/RH(D): O POS
Antibody Screen: NEGATIVE

## 2016-01-01 LAB — SURGICAL PCR SCREEN
MRSA, PCR: NEGATIVE
STAPHYLOCOCCUS AUREUS: POSITIVE — AB

## 2016-01-01 LAB — URINE MICROSCOPIC-ADD ON
Bacteria, UA: NONE SEEN
WBC, UA: NONE SEEN WBC/hpf (ref 0–5)

## 2016-01-01 LAB — PROTIME-INR
INR: 1 (ref 0.00–1.49)
Prothrombin Time: 13.4 seconds (ref 11.6–15.2)

## 2016-01-01 LAB — APTT: aPTT: 29 seconds (ref 24–37)

## 2016-01-01 MED ORDER — SODIUM CHLORIDE 0.9 % IV SOLN
INTRAVENOUS | Status: AC
  Administered 2016-01-02: 1 [IU]/h via INTRAVENOUS
  Filled 2016-01-01: qty 2.5

## 2016-01-01 MED ORDER — AMINOCAPROIC ACID 250 MG/ML IV SOLN
INTRAVENOUS | Status: AC
  Administered 2016-01-02: 69.8 mL/h via INTRAVENOUS
  Filled 2016-01-01: qty 40

## 2016-01-01 MED ORDER — METOPROLOL TARTRATE 12.5 MG HALF TABLET: 12.5000 mg | ORAL_TABLET | Freq: Once | ORAL | Status: DC

## 2016-01-01 MED ORDER — DEXTROSE 5 % IV SOLN
750.0000 mg | INTRAVENOUS | Status: DC
  Filled 2016-01-01: qty 750

## 2016-01-01 MED ORDER — ALBUTEROL SULFATE (2.5 MG/3ML) 0.083% IN NEBU
2.5000 mg | INHALATION_SOLUTION | Freq: Once | RESPIRATORY_TRACT | Status: AC
  Administered 2016-01-01: 2.5 mg via RESPIRATORY_TRACT

## 2016-01-01 MED ORDER — PLASMA-LYTE 148 IV SOLN
INTRAVENOUS | Status: AC
  Administered 2016-01-02: 500 mL
  Filled 2016-01-01: qty 2.5

## 2016-01-01 MED ORDER — HEPARIN SODIUM (PORCINE) 1000 UNIT/ML IJ SOLN
INTRAMUSCULAR | Status: DC
  Filled 2016-01-01: qty 30

## 2016-01-01 MED ORDER — CHLORHEXIDINE GLUCONATE 0.12 % MT SOLN
15.0000 mL | Freq: Once | OROMUCOSAL | Status: AC
  Administered 2016-01-02: 15 mL via OROMUCOSAL
  Filled 2016-01-01: qty 15

## 2016-01-01 MED ORDER — DEXMEDETOMIDINE HCL IN NACL 400 MCG/100ML IV SOLN
0.1000 ug/kg/h | INTRAVENOUS | Status: AC
  Administered 2016-01-02: .2 ug/kg/h via INTRAVENOUS
  Filled 2016-01-01: qty 100

## 2016-01-01 MED ORDER — MAGNESIUM SULFATE 50 % IJ SOLN
40.0000 meq | INTRAMUSCULAR | Status: DC
  Filled 2016-01-01: qty 10

## 2016-01-01 MED ORDER — DOPAMINE-DEXTROSE 3.2-5 MG/ML-% IV SOLN
0.0000 ug/kg/min | INTRAVENOUS | Status: AC
  Administered 2016-01-02: 3 ug/kg/min via INTRAVENOUS
  Filled 2016-01-01: qty 250

## 2016-01-01 MED ORDER — NITROGLYCERIN IN D5W 200-5 MCG/ML-% IV SOLN
2.0000 ug/min | INTRAVENOUS | Status: AC
  Administered 2016-01-02: 3 ug/min via INTRAVENOUS
  Filled 2016-01-01: qty 250

## 2016-01-01 MED ORDER — DEXTROSE 5 % IV SOLN
1.5000 g | INTRAVENOUS | Status: AC
  Administered 2016-01-02: .75 g via INTRAVENOUS
  Administered 2016-01-02: 1.5 g via INTRAVENOUS
  Filled 2016-01-01 (×2): qty 1.5

## 2016-01-01 MED ORDER — POTASSIUM CHLORIDE 2 MEQ/ML IV SOLN
80.0000 meq | INTRAVENOUS | Status: DC
  Filled 2016-01-01: qty 40

## 2016-01-01 MED ORDER — EPINEPHRINE HCL 1 MG/ML IJ SOLN
0.0000 ug/min | INTRAMUSCULAR | Status: DC
  Filled 2016-01-01: qty 4

## 2016-01-01 MED ORDER — VANCOMYCIN HCL 10 G IV SOLR
1500.0000 mg | INTRAVENOUS | Status: AC
  Administered 2016-01-02: 1500 mg via INTRAVENOUS
  Filled 2016-01-01: qty 1500

## 2016-01-01 MED ORDER — PHENYLEPHRINE HCL 10 MG/ML IJ SOLN
30.0000 ug/min | INTRAVENOUS | Status: AC
  Administered 2016-01-02: 20 ug/min via INTRAVENOUS
  Filled 2016-01-01: qty 2

## 2016-01-01 NOTE — Pre-Procedure Instructions (Signed)
BEKIM WERNTZ  01/01/2016      [id number] [address] [place] [address]Phone: [phone]: [phone]   Your procedure is scheduled on Tuesday, June 13th               [facility]             (Posted surgery time 7:30 am - 1:33 pm)   Call this number if you have problems the morning of surgery:  [phone]   Remember:  Do not eat food or drink liquids after midnight tonight.   Take these medicines the morning of surgery with A SIP OF WATER : Azor, Eye drops, Metoprolol   Do not wear jewelry - no rings or watches.  Do not wear lotions or colognes.                 Men may shave face and neck.  Do not bring valuables to the hospital.  [facility] or [facility].  Contacts, dentures or bridgework may not be worn into surgery.  Leave your suitcase in the car.  After surgery it may be brought to your room. For patients admitted to the hospital, discharge time will be determined by your treatment team.    Name and phone number of your driver:      Please read over the following fact sheets that you were given. Pain Booklet, MRSA Information and Surgical Site Infection Prevention

## 2016-01-01 NOTE — Progress Notes (Signed)
   01/01/16 1507  OBSTRUCTIVE SLEEP APNEA  Have you ever been diagnosed with sleep apnea through a sleep study? No  Do you snore loudly (loud enough to be heard through closed doors)?  0  Do you often feel tired, fatigued, or sleepy during the daytime (such as falling asleep during driving or talking to someone)? 1  Has anyone observed you stop breathing during your sleep? 0  Do you have, or are you being treated for high blood pressure? 1  BMI more than 35 kg/m2? 0  Age > 50 (1-yes) 1  Neck circumference greater than:Male 16 inches or larger, Male 17inches or larger? 1  Male Gender (Yes=1) 1  Obstructive Sleep Apnea Score 5  Score 5 or greater  Results sent to PCP

## 2016-01-01 NOTE — Anesthesia Preprocedure Evaluation (Addendum)
Anesthesia Evaluation  Patient identified by MRN, date of birth, ID band Patient awake    Reviewed: Allergy & Precautions, H&P , NPO status , Patient's Chart, lab work & pertinent test results, reviewed documented beta blocker date and time   Airway Mallampati: III  TM Distance: >3 FB Neck ROM: Full    Dental no notable dental hx. (+) Teeth Intact, Dental Advisory Given   Pulmonary neg pulmonary ROS, former smoker,    Pulmonary exam normal breath sounds clear to auscultation       Cardiovascular hypertension, Pt. on medications and Pt. on home beta blockers + angina + CAD and + Peripheral Vascular Disease   Rhythm:Regular Rate:Normal     Neuro/Psych negative neurological ROS  negative psych ROS   GI/Hepatic negative GI ROS, Neg liver ROS,   Endo/Other  diabetes, Type 2, Oral Hypoglycemic Agents  Renal/GU negative Renal ROS  negative genitourinary   Musculoskeletal   Abdominal   Peds  Hematology negative hematology ROS (+)   Anesthesia Other Findings   Reproductive/Obstetrics negative OB ROS                            Anesthesia Physical Anesthesia Plan  ASA: IV  Anesthesia Plan: General   Post-op Pain Management:    Induction: Intravenous  Airway Management Planned: Oral ETT  Additional Equipment: Arterial line, CVP, PA Cath, TEE and Ultrasound Guidance Line Placement  Intra-op Plan:   Post-operative Plan: Post-operative intubation/ventilation  Informed Consent: I have reviewed the patients History and Physical, chart, labs and discussed the procedure including the risks, benefits and alternatives for the proposed anesthesia with the patient or authorized representative who has indicated his/her understanding and acceptance.   Dental advisory given  Plan Discussed with: CRNA  Anesthesia Plan Comments:         Anesthesia Quick Evaluation

## 2016-01-01 NOTE — Progress Notes (Signed)
Pre-op Cardiac Surgery  Carotid Findings:   Findings consistent with 1- 39 percent stenosis involving the right internal carotid artery and the left internal carotid artery.   Upper Extremity Right Left  Brachial Pressures 144 Triphasic 142 Triphasic  Radial Waveforms Tri[phasic Triphasic  Ulnar Waveforms Triphasic Triphasic  Palmar Arch (Allen's Test) Normal Normal   Findings:  Palmar Arch evaluation- Doppler waveforms remained normal with both radial and ulnar compression bilaterally.    Lower  Extremity Right Left  Anterior Tibial 124 Biphasic 108 Biphasic  Posterior Tibial 164 Triphasic 115 Biphasic  Ankle/Brachial Indices 1.14 0.80  TBI's 0.67 0.24    Findings:  ABI's and Doppler waveforms indicate right leg is with in normal limits. Left leg indicates mild arterial disease.  Patient is diabetic and TBI's were performed and indicates bilaterally TBI's are abnormal.

## 2016-01-02 ENCOUNTER — Inpatient Hospital Stay (HOSPITAL_COMMUNITY): Payer: 59

## 2016-01-02 ENCOUNTER — Inpatient Hospital Stay (HOSPITAL_COMMUNITY)
Admission: RE | Admit: 2016-01-02 | Discharge: 2016-01-11 | DRG: 236 | Disposition: A | Discharge status: Home Health | Payer: 59 | Source: Ambulatory Visit | Attending: Cardiothoracic Surgery | Admitting: Cardiothoracic Surgery

## 2016-01-02 ENCOUNTER — Inpatient Hospital Stay (HOSPITAL_COMMUNITY): Payer: 59 | Admitting: Anesthesiology

## 2016-01-02 ENCOUNTER — Encounter (HOSPITAL_COMMUNITY): Payer: Self-pay | Admitting: *Deleted

## 2016-01-02 ENCOUNTER — Encounter (HOSPITAL_COMMUNITY)
Admission: RE | Disposition: A | Discharge status: Home Health | Payer: Self-pay | Source: Ambulatory Visit | Attending: Cardiothoracic Surgery

## 2016-01-02 DIAGNOSIS — E785 Hyperlipidemia, unspecified: Secondary | ICD-10-CM | POA: Diagnosis present

## 2016-01-02 DIAGNOSIS — R0602 Shortness of breath: Secondary | ICD-10-CM

## 2016-01-02 DIAGNOSIS — K567 Ileus, unspecified: Secondary | ICD-10-CM | POA: Diagnosis not present

## 2016-01-02 DIAGNOSIS — E669 Obesity, unspecified: Secondary | ICD-10-CM | POA: Diagnosis present

## 2016-01-02 DIAGNOSIS — I70213 Atherosclerosis of native arteries of extremities with intermittent claudication, bilateral legs: Secondary | ICD-10-CM | POA: Diagnosis present

## 2016-01-02 DIAGNOSIS — Z7982 Long term (current) use of aspirin: Secondary | ICD-10-CM

## 2016-01-02 DIAGNOSIS — Z79899 Other long term (current) drug therapy: Secondary | ICD-10-CM | POA: Diagnosis not present

## 2016-01-02 DIAGNOSIS — E8770 Fluid overload, unspecified: Secondary | ICD-10-CM | POA: Diagnosis not present

## 2016-01-02 DIAGNOSIS — Z87891 Personal history of nicotine dependence: Secondary | ICD-10-CM

## 2016-01-02 DIAGNOSIS — E1151 Type 2 diabetes mellitus with diabetic peripheral angiopathy without gangrene: Secondary | ICD-10-CM | POA: Diagnosis present

## 2016-01-02 DIAGNOSIS — Z9109 Other allergy status, other than to drugs and biological substances: Secondary | ICD-10-CM

## 2016-01-02 DIAGNOSIS — Z6830 Body mass index (BMI) 30.0-30.9, adult: Secondary | ICD-10-CM

## 2016-01-02 DIAGNOSIS — I252 Old myocardial infarction: Secondary | ICD-10-CM | POA: Diagnosis not present

## 2016-01-02 DIAGNOSIS — I25110 Atherosclerotic heart disease of native coronary artery with unstable angina pectoris: Secondary | ICD-10-CM

## 2016-01-02 DIAGNOSIS — D62 Acute posthemorrhagic anemia: Secondary | ICD-10-CM | POA: Diagnosis not present

## 2016-01-02 DIAGNOSIS — E872 Acidosis: Secondary | ICD-10-CM | POA: Diagnosis present

## 2016-01-02 DIAGNOSIS — Z955 Presence of coronary angioplasty implant and graft: Secondary | ICD-10-CM | POA: Diagnosis not present

## 2016-01-02 DIAGNOSIS — I4891 Unspecified atrial fibrillation: Secondary | ICD-10-CM | POA: Diagnosis present

## 2016-01-02 DIAGNOSIS — R0600 Dyspnea, unspecified: Secondary | ICD-10-CM | POA: Diagnosis present

## 2016-01-02 DIAGNOSIS — I10 Essential (primary) hypertension: Secondary | ICD-10-CM | POA: Diagnosis present

## 2016-01-02 DIAGNOSIS — J939 Pneumothorax, unspecified: Secondary | ICD-10-CM

## 2016-01-02 DIAGNOSIS — I2510 Atherosclerotic heart disease of native coronary artery without angina pectoris: Secondary | ICD-10-CM

## 2016-01-02 DIAGNOSIS — I25119 Atherosclerotic heart disease of native coronary artery with unspecified angina pectoris: Principal | ICD-10-CM | POA: Diagnosis present

## 2016-01-02 DIAGNOSIS — Z951 Presence of aortocoronary bypass graft: Secondary | ICD-10-CM

## 2016-01-02 HISTORY — PX: TEE WITHOUT CARDIOVERSION: SHX5443

## 2016-01-02 HISTORY — PX: CORONARY ARTERY BYPASS GRAFT: SHX141

## 2016-01-02 LAB — POCT I-STAT, CHEM 8
BUN: 10 mg/dL (ref 6–20)
BUN: 8 mg/dL (ref 6–20)
BUN: 9 mg/dL (ref 6–20)
BUN: 8 mg/dL (ref 6–20)
BUN: 8 mg/dL (ref 6–20)
BUN: 8 mg/dL (ref 6–20)
BUN: 6 mg/dL (ref 6–20)
Calcium, Ion: 1.28 mmol/L (ref 1.13–1.30)
Calcium, Ion: 1.02 mmol/L — ABNORMAL LOW (ref 1.13–1.30)
Calcium, Ion: 1.28 mmol/L (ref 1.13–1.30)
Calcium, Ion: 0.99 mmol/L — ABNORMAL LOW (ref 1.13–1.30)
Calcium, Ion: 1.03 mmol/L — ABNORMAL LOW (ref 1.13–1.30)
Calcium, Ion: 1.08 mmol/L — ABNORMAL LOW (ref 1.13–1.30)
Calcium, Ion: 1.21 mmol/L (ref 1.13–1.30)
Chloride: 103 mmol/L (ref 101–111)
Chloride: 102 mmol/L (ref 101–111)
Chloride: 96 mmol/L — ABNORMAL LOW (ref 101–111)
Chloride: 100 mmol/L — ABNORMAL LOW (ref 101–111)
Chloride: 100 mmol/L — ABNORMAL LOW (ref 101–111)
Chloride: 102 mmol/L (ref 101–111)
Chloride: 107 mmol/L (ref 101–111)
Creatinine, Ser: 0.5 mg/dL — ABNORMAL LOW (ref 0.61–1.24)
Creatinine, Ser: 0.4 mg/dL — ABNORMAL LOW (ref 0.61–1.24)
Creatinine, Ser: 0.6 mg/dL — ABNORMAL LOW (ref 0.61–1.24)
Creatinine, Ser: 0.3 mg/dL — ABNORMAL LOW (ref 0.61–1.24)
Creatinine, Ser: 0.4 mg/dL — ABNORMAL LOW (ref 0.61–1.24)
Creatinine, Ser: 0.5 mg/dL — ABNORMAL LOW (ref 0.61–1.24)
Creatinine, Ser: 0.5 mg/dL — ABNORMAL LOW (ref 0.61–1.24)
Glucose, Bld: 126 mg/dL — ABNORMAL HIGH (ref 65–99)
Glucose, Bld: 120 mg/dL — ABNORMAL HIGH (ref 65–99)
Glucose, Bld: 136 mg/dL — ABNORMAL HIGH (ref 65–99)
Glucose, Bld: 152 mg/dL — ABNORMAL HIGH (ref 65–99)
Glucose, Bld: 161 mg/dL — ABNORMAL HIGH (ref 65–99)
Glucose, Bld: 185 mg/dL — ABNORMAL HIGH (ref 65–99)
Glucose, Bld: 154 mg/dL — ABNORMAL HIGH (ref 65–99)
HCT: 41 % (ref 39.0–52.0)
HCT: 30 % — ABNORMAL LOW (ref 39.0–52.0)
HCT: 41 % (ref 39.0–52.0)
HCT: 28 % — ABNORMAL LOW (ref 39.0–52.0)
HCT: 28 % — ABNORMAL LOW (ref 39.0–52.0)
HCT: 33 % — ABNORMAL LOW (ref 39.0–52.0)
HCT: 32 % — ABNORMAL LOW (ref 39.0–52.0)
Hemoglobin: 13.9 g/dL (ref 13.0–17.0)
Hemoglobin: 10.2 g/dL — ABNORMAL LOW (ref 13.0–17.0)
Hemoglobin: 13.9 g/dL (ref 13.0–17.0)
Hemoglobin: 9.5 g/dL — ABNORMAL LOW (ref 13.0–17.0)
Hemoglobin: 9.5 g/dL — ABNORMAL LOW (ref 13.0–17.0)
Hemoglobin: 11.2 g/dL — ABNORMAL LOW (ref 13.0–17.0)
Hemoglobin: 10.9 g/dL — ABNORMAL LOW (ref 13.0–17.0)
Potassium: 3.7 mmol/L (ref 3.5–5.1)
Potassium: 3.6 mmol/L (ref 3.5–5.1)
Potassium: 4 mmol/L (ref 3.5–5.1)
Potassium: 4.3 mmol/L (ref 3.5–5.1)
Potassium: 4.1 mmol/L (ref 3.5–5.1)
Potassium: 4.2 mmol/L (ref 3.5–5.1)
Potassium: 3.6 mmol/L (ref 3.5–5.1)
Sodium: 140 mmol/L (ref 135–145)
Sodium: 133 mmol/L — ABNORMAL LOW (ref 135–145)
Sodium: 139 mmol/L (ref 135–145)
Sodium: 135 mmol/L (ref 135–145)
Sodium: 136 mmol/L (ref 135–145)
Sodium: 138 mmol/L (ref 135–145)
Sodium: 137 mmol/L (ref 135–145)
TCO2: 25 mmol/L (ref 0–100)
TCO2: 24 mmol/L (ref 0–100)
TCO2: 25 mmol/L (ref 0–100)
TCO2: 25 mmol/L (ref 0–100)
TCO2: 24 mmol/L (ref 0–100)
TCO2: 22 mmol/L (ref 0–100)
TCO2: 22 mmol/L (ref 0–100)

## 2016-01-02 LAB — CBC
HCT: 38 % — ABNORMAL LOW (ref 39.0–52.0)
HCT: 34.6 % — ABNORMAL LOW (ref 39.0–52.0)
Hemoglobin: 12.6 g/dL — ABNORMAL LOW (ref 13.0–17.0)
Hemoglobin: 11.4 g/dL — ABNORMAL LOW (ref 13.0–17.0)
MCH: 28.1 pg (ref 26.0–34.0)
MCH: 27.9 pg (ref 26.0–34.0)
MCHC: 33.2 g/dL (ref 30.0–36.0)
MCHC: 32.9 g/dL (ref 30.0–36.0)
MCV: 84.8 fL (ref 78.0–100.0)
MCV: 84.6 fL (ref 78.0–100.0)
PLATELETS: 120 10*3/uL — AB (ref 150–400)
Platelets: 118 10*3/uL — ABNORMAL LOW (ref 150–400)
RBC: 4.48 MIL/uL (ref 4.22–5.81)
RBC: 4.09 MIL/uL — ABNORMAL LOW (ref 4.22–5.81)
RDW: 15 % (ref 11.5–15.5)
RDW: 15 % (ref 11.5–15.5)
WBC: 11.1 10*3/uL — ABNORMAL HIGH (ref 4.0–10.5)
WBC: 9.5 10*3/uL (ref 4.0–10.5)

## 2016-01-02 LAB — POCT I-STAT 3, VENOUS BLOOD GAS (G3P V)
Acid-Base Excess: 2 mmol/L (ref 0.0–2.0)
Bicarbonate: 29.5 mEq/L — ABNORMAL HIGH (ref 20.0–24.0)
O2 Saturation: 91 %
Patient temperature: 36.6
TCO2: 31 mmol/L (ref 0–100)
pCO2, Ven: 58.8 mmHg — ABNORMAL HIGH (ref 45.0–50.0)
pH, Ven: 7.307 — ABNORMAL HIGH (ref 7.250–7.300)
pO2, Ven: 66 mmHg — ABNORMAL HIGH (ref 31.0–45.0)

## 2016-01-02 LAB — APTT: aPTT: 27 seconds (ref 24–37)

## 2016-01-02 LAB — POCT I-STAT 3, ART BLOOD GAS (G3+)
Acid-base deficit: 3 mmol/L — ABNORMAL HIGH (ref 0.0–2.0)
Acid-base deficit: 5 mmol/L — ABNORMAL HIGH (ref 0.0–2.0)
Acid-base deficit: 6 mmol/L — ABNORMAL HIGH (ref 0.0–2.0)
Acid-base deficit: 4 mmol/L — ABNORMAL HIGH (ref 0.0–2.0)
Acid-base deficit: 4 mmol/L — ABNORMAL HIGH (ref 0.0–2.0)
Bicarbonate: 23.2 mEq/L (ref 20.0–24.0)
Bicarbonate: 21.4 mEq/L (ref 20.0–24.0)
Bicarbonate: 19.9 mEq/L — ABNORMAL LOW (ref 20.0–24.0)
Bicarbonate: 21.3 mEq/L (ref 20.0–24.0)
Bicarbonate: 21.8 mEq/L (ref 20.0–24.0)
O2 Saturation: 100 %
O2 Saturation: 98 %
O2 Saturation: 92 %
O2 Saturation: 94 %
O2 Saturation: 89 %
Patient temperature: 37
Patient temperature: 37.2
Patient temperature: 37.2
TCO2: 25 mmol/L (ref 0–100)
TCO2: 23 mmol/L (ref 0–100)
TCO2: 21 mmol/L (ref 0–100)
TCO2: 22 mmol/L (ref 0–100)
TCO2: 23 mmol/L (ref 0–100)
pCO2 arterial: 45.9 mmHg — ABNORMAL HIGH (ref 35.0–45.0)
pCO2 arterial: 45.4 mmHg — ABNORMAL HIGH (ref 35.0–45.0)
pCO2 arterial: 40.5 mmHg (ref 35.0–45.0)
pCO2 arterial: 38.6 mmHg (ref 35.0–45.0)
pCO2 arterial: 41.8 mmHg (ref 35.0–45.0)
pH, Arterial: 7.312 — ABNORMAL LOW (ref 7.350–7.450)
pH, Arterial: 7.282 — ABNORMAL LOW (ref 7.350–7.450)
pH, Arterial: 7.298 — ABNORMAL LOW (ref 7.350–7.450)
pH, Arterial: 7.351 (ref 7.350–7.450)
pH, Arterial: 7.326 — ABNORMAL LOW (ref 7.350–7.450)
pO2, Arterial: 303 mmHg — ABNORMAL HIGH (ref 80.0–100.0)
pO2, Arterial: 110 mmHg — ABNORMAL HIGH (ref 80.0–100.0)
pO2, Arterial: 70 mmHg — ABNORMAL LOW (ref 80.0–100.0)
pO2, Arterial: 73 mmHg — ABNORMAL LOW (ref 80.0–100.0)
pO2, Arterial: 62 mmHg — ABNORMAL LOW (ref 80.0–100.0)

## 2016-01-02 LAB — GLUCOSE, CAPILLARY
Glucose-Capillary: 133 mg/dL — ABNORMAL HIGH (ref 65–99)
Glucose-Capillary: 108 mg/dL — ABNORMAL HIGH (ref 65–99)
Glucose-Capillary: 112 mg/dL — ABNORMAL HIGH (ref 65–99)
Glucose-Capillary: 114 mg/dL — ABNORMAL HIGH (ref 65–99)
Glucose-Capillary: 117 mg/dL — ABNORMAL HIGH (ref 65–99)
Glucose-Capillary: 125 mg/dL — ABNORMAL HIGH (ref 65–99)
Glucose-Capillary: 144 mg/dL — ABNORMAL HIGH (ref 65–99)
Glucose-Capillary: 145 mg/dL — ABNORMAL HIGH (ref 65–99)

## 2016-01-02 LAB — MAGNESIUM: Magnesium: 2.7 mg/dL — ABNORMAL HIGH (ref 1.7–2.4)

## 2016-01-02 LAB — PLATELET COUNT: Platelets: 95 10*3/uL — ABNORMAL LOW (ref 150–400)

## 2016-01-02 LAB — PROTIME-INR
INR: 1.43 (ref 0.00–1.49)
PROTHROMBIN TIME: 17.5 s — AB (ref 11.6–15.2)

## 2016-01-02 LAB — POCT I-STAT 4, (NA,K, GLUC, HGB,HCT)
Glucose, Bld: 125 mg/dL — ABNORMAL HIGH (ref 65–99)
HCT: 36 % — ABNORMAL LOW (ref 39.0–52.0)
Hemoglobin: 12.2 g/dL — ABNORMAL LOW (ref 13.0–17.0)
Potassium: 3.7 mmol/L (ref 3.5–5.1)
Sodium: 142 mmol/L (ref 135–145)

## 2016-01-02 LAB — HEMOGLOBIN A1C
Hgb A1c MFr Bld: 6.8 % — ABNORMAL HIGH (ref 4.8–5.6)
Mean Plasma Glucose: 148 mg/dL

## 2016-01-02 LAB — HEMOGLOBIN AND HEMATOCRIT, BLOOD
HCT: 29 % — ABNORMAL LOW (ref 39.0–52.0)
Hemoglobin: 9.7 g/dL — ABNORMAL LOW (ref 13.0–17.0)

## 2016-01-02 LAB — POTASSIUM: Potassium: 3.8 mmol/L (ref 3.5–5.1)

## 2016-01-02 LAB — CREATININE, SERUM
Creatinine, Ser: 0.71 mg/dL (ref 0.61–1.24)
GFR calc Af Amer: >60 mL/min (ref >60)
GFR calc non Af Amer: >60 mL/min (ref >60)

## 2016-01-02 SURGERY — CORONARY ARTERY BYPASS GRAFTING (CABG)
Anesthesia: General | Site: Chest

## 2016-01-02 MED ORDER — SODIUM CHLORIDE 0.9 % IV SOLN
250.0000 mL | INTRAVENOUS | Status: DC
  Administered 2016-01-03: 250 mL via INTRAVENOUS

## 2016-01-02 MED ORDER — CETYLPYRIDINIUM CHLORIDE 0.05 % MT LIQD
7.0000 mL | Freq: Two times a day (BID) | OROMUCOSAL | Status: DC
  Administered 2016-01-03: 7 mL via OROMUCOSAL

## 2016-01-02 MED ORDER — METOPROLOL TARTRATE 25 MG/10 ML ORAL SUSPENSION
12.5000 mg | Freq: Two times a day (BID) | ORAL | Status: DC
  Administered 2016-01-09: 12.5 mg
  Filled 2016-01-02: qty 10
  Filled 2016-01-02 (×3): qty 5

## 2016-01-02 MED ORDER — FENOFIBRATE 160 MG PO TABS
160.0000 mg | ORAL_TABLET | Freq: Every day | ORAL | Status: DC
  Administered 2016-01-03 – 2016-01-11 (×9): 160 mg via ORAL
  Filled 2016-01-02 (×9): qty 1

## 2016-01-02 MED ORDER — DEXTROSE 5 % IV SOLN
0.0000 ug/min | INTRAVENOUS | Status: DC
  Administered 2016-01-04: 10 ug/min via INTRAVENOUS
  Filled 2016-01-02 (×2): qty 2

## 2016-01-02 MED ORDER — HEMOSTATIC AGENTS (NO CHARGE) OPTIME
TOPICAL | Status: DC | PRN
  Administered 2016-01-02: 1 via TOPICAL

## 2016-01-02 MED ORDER — PHENYLEPHRINE 40 MCG/ML (10ML) SYRINGE FOR IV PUSH (FOR BLOOD PRESSURE SUPPORT)
PREFILLED_SYRINGE | INTRAVENOUS | Status: AC
  Filled 2016-01-02: qty 10

## 2016-01-02 MED ORDER — VECURONIUM BROMIDE 10 MG IV SOLR
INTRAVENOUS | Status: AC
  Filled 2016-01-02: qty 20

## 2016-01-02 MED ORDER — VECURONIUM BROMIDE 10 MG IV SOLR
INTRAVENOUS | Status: DC | PRN
  Administered 2016-01-02 (×4): 5 mg via INTRAVENOUS

## 2016-01-02 MED ORDER — LACTATED RINGERS IV SOLN: 500.0000 mL | Freq: Once | INTRAVENOUS | Status: DC | PRN

## 2016-01-02 MED ORDER — PROTAMINE SULFATE 10 MG/ML IV SOLN
INTRAVENOUS | Status: AC
  Filled 2016-01-02: qty 10

## 2016-01-02 MED ORDER — PROPOFOL 10 MG/ML IV BOLUS
INTRAVENOUS | Status: AC
  Filled 2016-01-02: qty 20

## 2016-01-02 MED ORDER — ASPIRIN 81 MG PO CHEW
324.0000 mg | CHEWABLE_TABLET | Freq: Every day | ORAL | Status: DC
  Administered 2016-01-05 – 2016-01-11 (×7): 324 mg
  Filled 2016-01-02 (×6): qty 4

## 2016-01-02 MED ORDER — HEMOSTATIC AGENTS (NO CHARGE) OPTIME
TOPICAL | Status: DC | PRN
  Administered 2016-01-02: 2 via TOPICAL

## 2016-01-02 MED ORDER — PROTAMINE SULFATE 10 MG/ML IV SOLN
INTRAVENOUS | Status: DC | PRN
  Administered 2016-01-02: 300 mg via INTRAVENOUS

## 2016-01-02 MED ORDER — MORPHINE SULFATE (PF) 2 MG/ML IV SOLN
2.0000 mg | INTRAVENOUS | Status: DC | PRN
  Administered 2016-01-02 – 2016-01-03 (×4): 2 mg via INTRAVENOUS
  Administered 2016-01-03 (×2): 4 mg via INTRAVENOUS
  Administered 2016-01-03: 2 mg via INTRAVENOUS
  Filled 2016-01-02 (×2): qty 1
  Filled 2016-01-02 (×3): qty 2
  Filled 2016-01-02 (×3): qty 1

## 2016-01-02 MED ORDER — SODIUM CHLORIDE 0.9 % IJ SOLN
OROMUCOSAL | Status: DC | PRN
  Administered 2016-01-02 (×3): via TOPICAL

## 2016-01-02 MED ORDER — METOPROLOL TARTRATE 5 MG/5ML IV SOLN
2.5000 mg | INTRAVENOUS | Status: DC | PRN
  Filled 2016-01-02: qty 5

## 2016-01-02 MED ORDER — CHLORHEXIDINE GLUCONATE 0.12% ORAL RINSE (MEDLINE KIT)
15.0000 mL | Freq: Two times a day (BID) | OROMUCOSAL | Status: DC
  Administered 2016-01-02: 15 mL via OROMUCOSAL

## 2016-01-02 MED ORDER — ACETAMINOPHEN 160 MG/5ML PO SOLN
1000.0000 mg | Freq: Four times a day (QID) | ORAL | Status: AC
Start: 2016-01-03 — End: 2016-01-07
  Administered 2016-01-03 – 2016-01-05 (×6): 1000 mg
  Filled 2016-01-02 (×7): qty 40.6

## 2016-01-02 MED ORDER — LACTATED RINGERS IV SOLN: INTRAVENOUS | Status: DC

## 2016-01-02 MED ORDER — DEXMEDETOMIDINE HCL IN NACL 400 MCG/100ML IV SOLN
0.1000 ug/kg/h | INTRAVENOUS | Status: DC
  Filled 2016-01-02: qty 100

## 2016-01-02 MED ORDER — PROPOFOL 10 MG/ML IV BOLUS
INTRAVENOUS | Status: DC | PRN
  Administered 2016-01-02: 50 mg via INTRAVENOUS

## 2016-01-02 MED ORDER — DESMOPRESSIN ACETATE 4 MCG/ML IJ SOLN
20.0000 ug | INTRAMUSCULAR | Status: AC
  Administered 2016-01-02: 20 ug via INTRAVENOUS
  Filled 2016-01-02: qty 5

## 2016-01-02 MED ORDER — MIDAZOLAM HCL 5 MG/5ML IJ SOLN
INTRAMUSCULAR | Status: DC | PRN
  Administered 2016-01-02: 3 mg via INTRAVENOUS
  Administered 2016-01-02 (×2): 2 mg via INTRAVENOUS
  Administered 2016-01-02: 3 mg via INTRAVENOUS

## 2016-01-02 MED ORDER — PHENYLEPHRINE HCL 10 MG/ML IJ SOLN
0.0000 ug/min | INTRAMUSCULAR | Status: DC
  Filled 2016-01-02 (×2): qty 1

## 2016-01-02 MED ORDER — PANTOPRAZOLE SODIUM 40 MG PO TBEC
40.0000 mg | DELAYED_RELEASE_TABLET | Freq: Every day | ORAL | Status: DC
  Administered 2016-01-04 – 2016-01-11 (×8): 40 mg via ORAL
  Filled 2016-01-02 (×7): qty 1

## 2016-01-02 MED ORDER — SODIUM CHLORIDE 0.9% FLUSH
3.0000 mL | Freq: Two times a day (BID) | INTRAVENOUS | Status: DC
  Administered 2016-01-03 – 2016-01-11 (×9): 3 mL via INTRAVENOUS

## 2016-01-02 MED ORDER — CHLORHEXIDINE GLUCONATE 0.12 % MT SOLN
15.0000 mL | OROMUCOSAL | Status: AC
  Administered 2016-01-02: 15 mL via OROMUCOSAL
  Filled 2016-01-02: qty 15

## 2016-01-02 MED ORDER — SODIUM CHLORIDE 0.45 % IV SOLN: INTRAVENOUS | Status: DC | PRN

## 2016-01-02 MED ORDER — FENTANYL CITRATE (PF) 250 MCG/5ML IJ SOLN
INTRAMUSCULAR | Status: AC
  Filled 2016-01-02: qty 25

## 2016-01-02 MED ORDER — ASPIRIN EC 325 MG PO TBEC
325.0000 mg | DELAYED_RELEASE_TABLET | Freq: Every day | ORAL | Status: DC
  Administered 2016-01-03 – 2016-01-04 (×2): 325 mg via ORAL
  Filled 2016-01-02 (×3): qty 1

## 2016-01-02 MED ORDER — VANCOMYCIN HCL IN DEXTROSE 1-5 GM/200ML-% IV SOLN
1000.0000 mg | Freq: Once | INTRAVENOUS | Status: AC
  Administered 2016-01-02: 1000 mg via INTRAVENOUS
  Filled 2016-01-02: qty 200

## 2016-01-02 MED ORDER — LATANOPROST 0.005 % OP SOLN
1.0000 [drp] | Freq: Every day | OPHTHALMIC | Status: DC
  Administered 2016-01-02 – 2016-01-10 (×9): 1 [drp] via OPHTHALMIC
  Filled 2016-01-02 (×3): qty 2.5

## 2016-01-02 MED ORDER — DOPAMINE-DEXTROSE 3.2-5 MG/ML-% IV SOLN: 0.0000 ug/kg/min | INTRAVENOUS | Status: DC

## 2016-01-02 MED ORDER — METOPROLOL TARTRATE 12.5 MG HALF TABLET
12.5000 mg | ORAL_TABLET | Freq: Two times a day (BID) | ORAL | Status: DC
  Administered 2016-01-04 – 2016-01-11 (×10): 12.5 mg via ORAL
  Filled 2016-01-02 (×11): qty 1

## 2016-01-02 MED ORDER — HEPARIN SODIUM (PORCINE) 1000 UNIT/ML IJ SOLN
INTRAMUSCULAR | Status: DC | PRN
  Administered 2016-01-02 (×2): 2000 [IU] via INTRAVENOUS
  Administered 2016-01-02: 30000 [IU] via INTRAVENOUS

## 2016-01-02 MED ORDER — DEXMEDETOMIDINE HCL IN NACL 200 MCG/50ML IV SOLN
0.0000 ug/kg/h | INTRAVENOUS | Status: DC
  Filled 2016-01-02: qty 50

## 2016-01-02 MED ORDER — MUPIROCIN 2 % EX OINT
TOPICAL_OINTMENT | Freq: Two times a day (BID) | CUTANEOUS | Status: AC
Start: 2016-01-02 — End: 2016-01-06
  Administered 2016-01-02: 1 via NASAL
  Administered 2016-01-02 (×2): via NASAL
  Administered 2016-01-03: 1 via NASAL
  Administered 2016-01-03: 21:00:00 via NASAL
  Administered 2016-01-04: 1 via NASAL
  Administered 2016-01-04 – 2016-01-05 (×3): via NASAL
  Administered 2016-01-06: 1 via NASAL
  Filled 2016-01-02 (×2): qty 22

## 2016-01-02 MED ORDER — ACETAMINOPHEN 500 MG PO TABS
1000.0000 mg | ORAL_TABLET | Freq: Four times a day (QID) | ORAL | Status: AC
  Administered 2016-01-03 – 2016-01-07 (×9): 1000 mg via ORAL
  Filled 2016-01-02 (×9): qty 2

## 2016-01-02 MED ORDER — 0.9 % SODIUM CHLORIDE (POUR BTL) OPTIME
TOPICAL | Status: DC | PRN
  Administered 2016-01-02: 6000 mL

## 2016-01-02 MED ORDER — MIDAZOLAM HCL 2 MG/2ML IJ SOLN
2.0000 mg | INTRAMUSCULAR | Status: DC | PRN
  Administered 2016-01-02: 2 mg via INTRAVENOUS
  Filled 2016-01-02: qty 2

## 2016-01-02 MED ORDER — SODIUM BICARBONATE 8.4 % IV SOLN
50.0000 meq | Freq: Once | INTRAVENOUS | Status: AC
  Administered 2016-01-02: 50 meq via INTRAVENOUS

## 2016-01-02 MED ORDER — ALBUMIN HUMAN 5 % IV SOLN
INTRAVENOUS | Status: DC | PRN
  Administered 2016-01-02 (×2): via INTRAVENOUS

## 2016-01-02 MED ORDER — POTASSIUM CHLORIDE 10 MEQ/50ML IV SOLN
10.0000 meq | INTRAVENOUS | Status: AC
  Administered 2016-01-02 (×3): 10 meq via INTRAVENOUS

## 2016-01-02 MED ORDER — TRAMADOL HCL 50 MG PO TABS: 50.0000 mg | ORAL_TABLET | ORAL | Status: DC | PRN

## 2016-01-02 MED ORDER — CHLORHEXIDINE GLUCONATE 4 % EX LIQD: 30.0000 mL | CUTANEOUS | Status: DC

## 2016-01-02 MED ORDER — BISACODYL 5 MG PO TBEC
10.0000 mg | DELAYED_RELEASE_TABLET | Freq: Every day | ORAL | Status: DC
  Administered 2016-01-03 – 2016-01-11 (×9): 10 mg via ORAL
  Filled 2016-01-02 (×10): qty 2

## 2016-01-02 MED ORDER — SODIUM CHLORIDE 0.9% FLUSH: 3.0000 mL | INTRAVENOUS | Status: DC | PRN

## 2016-01-02 MED ORDER — ALBUMIN HUMAN 5 % IV SOLN
250.0000 mL | INTRAVENOUS | Status: AC | PRN
  Administered 2016-01-02 (×3): 250 mL via INTRAVENOUS
  Filled 2016-01-02: qty 250

## 2016-01-02 MED ORDER — SODIUM CHLORIDE 0.9 % IV SOLN
0.5000 g/h | INTRAVENOUS | Status: DC
  Filled 2016-01-02: qty 20

## 2016-01-02 MED ORDER — OXYCODONE HCL 5 MG PO TABS
5.0000 mg | ORAL_TABLET | ORAL | Status: DC | PRN
  Administered 2016-01-03 – 2016-01-04 (×3): 5 mg via ORAL
  Administered 2016-01-04: 10 mg via ORAL
  Filled 2016-01-02 (×3): qty 1
  Filled 2016-01-02 (×2): qty 2

## 2016-01-02 MED ORDER — PROTAMINE SULFATE 10 MG/ML IV SOLN
INTRAVENOUS | Status: AC
  Filled 2016-01-02: qty 25

## 2016-01-02 MED ORDER — MORPHINE SULFATE (PF) 2 MG/ML IV SOLN: 1.0000 mg | INTRAVENOUS | Status: AC | PRN

## 2016-01-02 MED ORDER — FAMOTIDINE IN NACL 20-0.9 MG/50ML-% IV SOLN
20.0000 mg | Freq: Two times a day (BID) | INTRAVENOUS | Status: DC
  Administered 2016-01-02: 20 mg via INTRAVENOUS

## 2016-01-02 MED ORDER — LACTATED RINGERS IV SOLN
INTRAVENOUS | Status: DC | PRN
  Administered 2016-01-02 (×2): via INTRAVENOUS

## 2016-01-02 MED ORDER — BISACODYL 10 MG RE SUPP: 10.0000 mg | Freq: Every day | RECTAL | Status: DC

## 2016-01-02 MED ORDER — INSULIN REGULAR BOLUS VIA INFUSION
0.0000 [IU] | Freq: Three times a day (TID) | INTRAVENOUS | Status: DC
  Administered 2016-01-03: 2 [IU] via INTRAVENOUS
  Filled 2016-01-02: qty 10

## 2016-01-02 MED ORDER — FENTANYL CITRATE (PF) 100 MCG/2ML IJ SOLN
INTRAMUSCULAR | Status: DC | PRN
  Administered 2016-01-02: 250 ug via INTRAVENOUS
  Administered 2016-01-02: 100 ug via INTRAVENOUS
  Administered 2016-01-02: 50 ug via INTRAVENOUS
  Administered 2016-01-02: 950 ug via INTRAVENOUS
  Administered 2016-01-02: 150 ug via INTRAVENOUS
  Administered 2016-01-02: 250 ug via INTRAVENOUS

## 2016-01-02 MED ORDER — HEPARIN SODIUM (PORCINE) 1000 UNIT/ML IJ SOLN
INTRAMUSCULAR | Status: AC
  Filled 2016-01-02: qty 1

## 2016-01-02 MED ORDER — SODIUM CHLORIDE 0.9 % IV SOLN
INTRAVENOUS | Status: DC | PRN
  Administered 2016-01-02: 13:00:00 via INTRAVENOUS

## 2016-01-02 MED ORDER — LACTATED RINGERS IV SOLN
INTRAVENOUS | Status: DC | PRN
  Administered 2016-01-02: 08:00:00 via INTRAVENOUS

## 2016-01-02 MED ORDER — SODIUM CHLORIDE 0.9 % IV SOLN
INTRAVENOUS | Status: DC
  Administered 2016-01-02: 20:00:00 via INTRAVENOUS
  Filled 2016-01-02 (×2): qty 2.5

## 2016-01-02 MED ORDER — NITROGLYCERIN IN D5W 200-5 MCG/ML-% IV SOLN: 0.0000 ug/min | INTRAVENOUS | Status: DC

## 2016-01-02 MED ORDER — ACETAMINOPHEN 650 MG RE SUPP
650.0000 mg | Freq: Once | RECTAL | Status: AC
  Administered 2016-01-02: 650 mg via RECTAL

## 2016-01-02 MED ORDER — ARTIFICIAL TEARS OP OINT
TOPICAL_OINTMENT | OPHTHALMIC | Status: DC | PRN
  Administered 2016-01-02: 1 via OPHTHALMIC

## 2016-01-02 MED ORDER — DORZOLAMIDE HCL-TIMOLOL MAL 2-0.5 % OP SOLN
1.0000 [drp] | Freq: Two times a day (BID) | OPHTHALMIC | Status: DC
  Administered 2016-01-03 – 2016-01-11 (×17): 1 [drp] via OPHTHALMIC
  Filled 2016-01-02: qty 10

## 2016-01-02 MED ORDER — MIDAZOLAM HCL 10 MG/2ML IJ SOLN
INTRAMUSCULAR | Status: AC
  Filled 2016-01-02: qty 2

## 2016-01-02 MED ORDER — DOCUSATE SODIUM 100 MG PO CAPS
200.0000 mg | ORAL_CAPSULE | Freq: Every day | ORAL | Status: DC
  Administered 2016-01-04 – 2016-01-11 (×8): 200 mg via ORAL
  Filled 2016-01-02 (×9): qty 2

## 2016-01-02 MED ORDER — ROSUVASTATIN CALCIUM 10 MG PO TABS
20.0000 mg | ORAL_TABLET | Freq: Every day | ORAL | Status: DC
  Administered 2016-01-03 – 2016-01-10 (×8): 20 mg via ORAL
  Filled 2016-01-02 (×3): qty 1
  Filled 2016-01-02: qty 2
  Filled 2016-01-02 (×3): qty 1
  Filled 2016-01-02 (×2): qty 2

## 2016-01-02 MED ORDER — FENTANYL CITRATE (PF) 250 MCG/5ML IJ SOLN
INTRAMUSCULAR | Status: AC
  Filled 2016-01-02: qty 5

## 2016-01-02 MED ORDER — DEXTROSE 5 % IV SOLN
1.5000 g | Freq: Two times a day (BID) | INTRAVENOUS | Status: AC
  Administered 2016-01-02 – 2016-01-04 (×4): 1.5 g via INTRAVENOUS
  Filled 2016-01-02 (×4): qty 1.5

## 2016-01-02 MED ORDER — ROCURONIUM BROMIDE 50 MG/5ML IV SOLN
INTRAVENOUS | Status: AC
  Filled 2016-01-02: qty 1

## 2016-01-02 MED ORDER — POTASSIUM CHLORIDE 10 MEQ/50ML IV SOLN
10.0000 meq | Freq: Once | INTRAVENOUS | Status: AC
  Administered 2016-01-02: 10 meq via INTRAVENOUS

## 2016-01-02 MED ORDER — ANTISEPTIC ORAL RINSE SOLUTION (CORINZ)
7.0000 mL | OROMUCOSAL | Status: DC
  Administered 2016-01-02 (×2): 7 mL via OROMUCOSAL

## 2016-01-02 MED ORDER — MAGNESIUM SULFATE 4 GM/100ML IV SOLN
4.0000 g | Freq: Once | INTRAVENOUS | Status: AC
  Administered 2016-01-02: 4 g via INTRAVENOUS
  Filled 2016-01-02: qty 100

## 2016-01-02 MED ORDER — BRIMONIDINE TARTRATE 0.15 % OP SOLN
1.0000 [drp] | Freq: Two times a day (BID) | OPHTHALMIC | Status: DC
  Administered 2016-01-03 – 2016-01-11 (×17): 1 [drp] via OPHTHALMIC
  Filled 2016-01-02: qty 5

## 2016-01-02 MED ORDER — SODIUM CHLORIDE 0.9 % IV SOLN: INTRAVENOUS | Status: DC

## 2016-01-02 MED ORDER — ROCURONIUM BROMIDE 100 MG/10ML IV SOLN
INTRAVENOUS | Status: DC | PRN
  Administered 2016-01-02: 50 mg via INTRAVENOUS

## 2016-01-02 MED ORDER — ONDANSETRON HCL 4 MG/2ML IJ SOLN
4.0000 mg | Freq: Four times a day (QID) | INTRAMUSCULAR | Status: DC | PRN
  Administered 2016-01-02 – 2016-01-03 (×2): 4 mg via INTRAVENOUS
  Filled 2016-01-02 (×2): qty 2

## 2016-01-02 MED ORDER — ACETAMINOPHEN 160 MG/5ML PO SOLN: 650.0000 mg | Freq: Once | ORAL | Status: AC

## 2016-01-02 MED ORDER — ONDANSETRON HCL 4 MG/2ML IJ SOLN
INTRAMUSCULAR | Status: AC
  Filled 2016-01-02: qty 2

## 2016-01-02 MED FILL — Magnesium Sulfate Inj 50%: INTRAMUSCULAR | Qty: 10 | Status: AC

## 2016-01-02 MED FILL — Electrolyte-R (PH 7.4) Solution: INTRAVENOUS | Qty: 6000 | Status: AC

## 2016-01-02 MED FILL — Sodium Bicarbonate IV Soln 8.4%: INTRAVENOUS | Qty: 50 | Status: AC

## 2016-01-02 MED FILL — Mannitol IV Soln 20%: INTRAVENOUS | Qty: 500 | Status: AC

## 2016-01-02 MED FILL — Lidocaine HCl IV Inj 20 MG/ML: INTRAVENOUS | Qty: 5 | Status: AC

## 2016-01-02 MED FILL — Potassium Chloride Inj 2 mEq/ML: INTRAVENOUS | Qty: 40 | Status: AC

## 2016-01-02 MED FILL — Heparin Sodium (Porcine) Inj 1000 Unit/ML: INTRAMUSCULAR | Qty: 30 | Status: AC

## 2016-01-02 MED FILL — Heparin Sodium (Porcine) Inj 1000 Unit/ML: INTRAMUSCULAR | Qty: 10 | Status: AC

## 2016-01-02 MED FILL — Sodium Chloride IV Soln 0.9%: INTRAVENOUS | Qty: 2000 | Status: AC

## 2016-01-02 SURGICAL SUPPLY — 102 items
ADAPTER CARDIO PERF ANTE/RETRO (ADAPTER) ×4 IMPLANT
BAG DECANTER FOR FLEXI CONT (MISCELLANEOUS) ×4 IMPLANT
BANDAGE ACE 4X5 VEL STRL LF (GAUZE/BANDAGES/DRESSINGS) ×4 IMPLANT
BANDAGE ACE 6X5 VEL STRL LF (GAUZE/BANDAGES/DRESSINGS) ×4 IMPLANT
BANDAGE ELASTIC 4 VELCRO ST LF (GAUZE/BANDAGES/DRESSINGS) ×4 IMPLANT
BANDAGE ELASTIC 6 VELCRO ST LF (GAUZE/BANDAGES/DRESSINGS) ×4 IMPLANT
BASKET HEART  (ORDER IN 25'S) (MISCELLANEOUS) ×1
BASKET HEART (ORDER IN 25'S) (MISCELLANEOUS) ×1
BASKET HEART (ORDER IN 25S) (MISCELLANEOUS) ×2 IMPLANT
BLADE STERNUM SYSTEM 6 (BLADE) ×4 IMPLANT
BLADE SURG 11 STRL SS (BLADE) ×4 IMPLANT
BLADE SURG 12 STRL SS (BLADE) ×4 IMPLANT
BLADE SURG ROTATE 9660 (MISCELLANEOUS) ×4 IMPLANT
BNDG GAUZE ELAST 4 BULKY (GAUZE/BANDAGES/DRESSINGS) ×4 IMPLANT
CANISTER SUCTION 2500CC (MISCELLANEOUS) ×4 IMPLANT
CANNULA GUNDRY RCSP 15FR (MISCELLANEOUS) ×4 IMPLANT
CATH CPB KIT VANTRIGT (MISCELLANEOUS) ×4 IMPLANT
CATH ROBINSON RED A/P 18FR (CATHETERS) ×12 IMPLANT
CATH THORACIC 36FR RT ANG (CATHETERS) ×4 IMPLANT
CLIP RETRACTION 3.0MM CORONARY (MISCELLANEOUS) ×4 IMPLANT
CLIP TI WIDE RED SMALL 24 (CLIP) ×8 IMPLANT
COVER SURGICAL LIGHT HANDLE (MISCELLANEOUS) ×4 IMPLANT
CRADLE DONUT ADULT HEAD (MISCELLANEOUS) ×4 IMPLANT
DRAIN CHANNEL 32F RND 10.7 FF (WOUND CARE) ×4 IMPLANT
DRAPE CARDIOVASCULAR INCISE (DRAPES) ×2
DRAPE SLUSH/WARMER DISC (DRAPES) ×4 IMPLANT
DRAPE SRG 135X102X78XABS (DRAPES) ×2 IMPLANT
DRSG AQUACEL AG ADV 3.5X14 (GAUZE/BANDAGES/DRESSINGS) ×4 IMPLANT
ELECT BLADE 4.0 EZ CLEAN MEGAD (MISCELLANEOUS) ×4
ELECT BLADE 6.5 EXT (BLADE) ×4 IMPLANT
ELECT CAUTERY BLADE 6.4 (BLADE) ×4 IMPLANT
ELECT REM PT RETURN 9FT ADLT (ELECTROSURGICAL) ×8
ELECTRODE BLDE 4.0 EZ CLN MEGD (MISCELLANEOUS) ×2 IMPLANT
ELECTRODE REM PT RTRN 9FT ADLT (ELECTROSURGICAL) ×4 IMPLANT
FELT TEFLON 1X6 (MISCELLANEOUS) ×4 IMPLANT
GAUZE SPONGE 4X4 12PLY STRL (GAUZE/BANDAGES/DRESSINGS) ×8 IMPLANT
GLOVE BIO SURGEON STRL SZ 6.5 (GLOVE) ×18 IMPLANT
GLOVE BIO SURGEON STRL SZ7.5 (GLOVE) ×12 IMPLANT
GLOVE BIO SURGEONS STRL SZ 6.5 (GLOVE) ×6
GLOVE BIOGEL PI IND STRL 6 (GLOVE) ×4 IMPLANT
GLOVE BIOGEL PI IND STRL 7.0 (GLOVE) ×4 IMPLANT
GLOVE BIOGEL PI INDICATOR 6 (GLOVE) ×4
GLOVE BIOGEL PI INDICATOR 7.0 (GLOVE) ×4
GOWN STRL REUS W/ TWL LRG LVL3 (GOWN DISPOSABLE) ×16 IMPLANT
GOWN STRL REUS W/TWL LRG LVL3 (GOWN DISPOSABLE) ×16
HEMOSTAT POWDER SURGIFOAM 1G (HEMOSTASIS) ×12 IMPLANT
HEMOSTAT SURGICEL 2X14 (HEMOSTASIS) ×4 IMPLANT
INSERT FOGARTY XLG (MISCELLANEOUS) IMPLANT
KIT BASIN OR (CUSTOM PROCEDURE TRAY) ×4 IMPLANT
KIT ROOM TURNOVER OR (KITS) ×4 IMPLANT
KIT SUCTION CATH 14FR (SUCTIONS) ×4 IMPLANT
KIT VASOVIEW 6 PRO VH 2400 (KITS) ×4 IMPLANT
LEAD PACING MYOCARDI (MISCELLANEOUS) ×4 IMPLANT
MARKER GRAFT CORONARY BYPASS (MISCELLANEOUS) ×12 IMPLANT
NS IRRIG 1000ML POUR BTL (IV SOLUTION) ×24 IMPLANT
PACK OPEN HEART (CUSTOM PROCEDURE TRAY) ×4 IMPLANT
PAD ARMBOARD 7.5X6 YLW CONV (MISCELLANEOUS) ×8 IMPLANT
PAD ELECT DEFIB RADIOL ZOLL (MISCELLANEOUS) ×4 IMPLANT
PENCIL BUTTON HOLSTER BLD 10FT (ELECTRODE) ×4 IMPLANT
PUNCH AORTIC ROTATE 4.0MM (MISCELLANEOUS) IMPLANT
PUNCH AORTIC ROTATE 4.5MM 8IN (MISCELLANEOUS) ×4 IMPLANT
PUNCH AORTIC ROTATE 5MM 8IN (MISCELLANEOUS) IMPLANT
SET CARDIOPLEGIA MPS 5001102 (MISCELLANEOUS) ×4 IMPLANT
SPONGE GAUZE 4X4 12PLY STER LF (GAUZE/BANDAGES/DRESSINGS) ×8 IMPLANT
SPONGE LAP 18X18 X RAY DECT (DISPOSABLE) ×16 IMPLANT
SPONGE LAP 4X18 X RAY DECT (DISPOSABLE) ×4 IMPLANT
SURGIFLO W/THROMBIN 8M KIT (HEMOSTASIS) ×12 IMPLANT
SUT BONE WAX W31G (SUTURE) ×4 IMPLANT
SUT MNCRL AB 4-0 PS2 18 (SUTURE) ×8 IMPLANT
SUT PROLENE 3 0 SH DA (SUTURE) IMPLANT
SUT PROLENE 3 0 SH1 36 (SUTURE) IMPLANT
SUT PROLENE 4 0 RB 1 (SUTURE) ×4
SUT PROLENE 4 0 SH DA (SUTURE) ×8 IMPLANT
SUT PROLENE 4-0 RB1 .5 CRCL 36 (SUTURE) ×4 IMPLANT
SUT PROLENE 5 0 C 1 36 (SUTURE) IMPLANT
SUT PROLENE 6 0 C 1 30 (SUTURE) ×20 IMPLANT
SUT PROLENE 6 0 CC (SUTURE) ×12 IMPLANT
SUT PROLENE 7 0 BV 1 (SUTURE) ×4 IMPLANT
SUT PROLENE 8 0 BV175 6 (SUTURE) ×8 IMPLANT
SUT PROLENE BLUE 7 0 (SUTURE) ×16 IMPLANT
SUT PROLENE POLY MONO (SUTURE) ×8 IMPLANT
SUT SILK  1 MH (SUTURE)
SUT SILK 1 MH (SUTURE) IMPLANT
SUT SILK 2 0 SH CR/8 (SUTURE) ×4 IMPLANT
SUT SILK 3 0 SH CR/8 (SUTURE) IMPLANT
SUT STEEL 6MS V (SUTURE) IMPLANT
SUT STEEL SZ 6 DBL 3X14 BALL (SUTURE) ×12 IMPLANT
SUT VIC AB 1 CTX 36 (SUTURE) ×4
SUT VIC AB 1 CTX36XBRD ANBCTR (SUTURE) ×4 IMPLANT
SUT VIC AB 2-0 CT1 27 (SUTURE) ×6
SUT VIC AB 2-0 CT1 TAPERPNT 27 (SUTURE) ×6 IMPLANT
SUT VIC AB 2-0 CTX 27 (SUTURE) ×4 IMPLANT
SUT VIC AB 3-0 X1 27 (SUTURE) IMPLANT
SUTURE E-PAK OPEN HEART (SUTURE) ×4 IMPLANT
SYSTEM SAHARA CHEST DRAIN ATS (WOUND CARE) ×4 IMPLANT
TAPE CLOTH SURG 4X10 WHT LF (GAUZE/BANDAGES/DRESSINGS) ×8 IMPLANT
TOWEL OR 17X24 6PK STRL BLUE (TOWEL DISPOSABLE) ×8 IMPLANT
TOWEL OR 17X26 10 PK STRL BLUE (TOWEL DISPOSABLE) ×8 IMPLANT
TRAY FOLEY IC TEMP SENS 16FR (CATHETERS) ×4 IMPLANT
TUBING INSUFFLATION (TUBING) ×4 IMPLANT
UNDERPAD 30X30 INCONTINENT (UNDERPADS AND DIAPERS) ×4 IMPLANT
WATER STERILE IRR 1000ML POUR (IV SOLUTION) ×8 IMPLANT

## 2016-01-02 NOTE — Transfer of Care (Signed)
Immediate Anesthesia Transfer of Care Note  Patient: Jorge Adams  Procedure(s) Performed: Procedure(s): CORONARY ARTERY BYPASS GRAFTING (CABG) TIMES 3 USING LEFT INTERNAL MAMMARY ARTERY AND RIGHT SAPHENOUS LEG VEIN HARVESTED ENDOSCOPICALLY (N/A) TRANSESOPHAGEAL ECHOCARDIOGRAM (TEE) (N/A)  Patient Location: SICU  Anesthesia Type:General  Level of Consciousness: Patient remains intubated per anesthesia plan  Airway & Oxygen Therapy: Patient remains intubated per anesthesia plan and Patient placed on Ventilator (see vital sign flow sheet for setting)  Post-op Assessment: Report given to RN and Post -op Vital signs reviewed and stable  Post vital signs: Reviewed and stable  Last Vitals:  Filed Vitals:   01/02/16 0619  BP: 162/78  Pulse: 58  Temp: 36.9 C  Resp: 18    Last Pain: There were no vitals filed for this visit.       Complications: No apparent anesthesia complications

## 2016-01-02 NOTE — Progress Notes (Signed)
The patient was examined and preop studies reviewed. There has been no change from the prior exam and the patient is ready for surgery.  plan CABG on Jorge Adams

## 2016-01-02 NOTE — Progress Notes (Signed)
  Echocardiogram Echocardiogram Transesophageal has been performed.  Bobbye Charleston 01/02/2016, 8:53 AM

## 2016-01-02 NOTE — Brief Op Note (Signed)
01/02/2016  12:21 PM  PATIENT:  Jorge Adams  65 y.o. male  PRE-OPERATIVE DIAGNOSIS:  CAD  POST-OPERATIVE DIAGNOSIS:  CAD  PROCEDURE:  TRANSESOPHAGEAL ECHOCARDIOGRAM (TEE), CORONARY ARTERY BYPASS GRAFTING (CABG) TIMES 3 (LIMA to LAD, SVG to OM1, SVG to PDA) USING LEFT INTERNAL MAMMARY ARTERY AND RIGHT SAPHENOUS LEG VEIN HARVESTED ENDOSCOPICALLY  SURGEON:  Surgeon(s) and Role:    * Ivin Poot, MD - Primary  PHYSICIAN ASSISTANT: Lars Pinks PA-C  ANESTHESIA:   general  EBL:  Total I/O In: -  Out: 1300 [Urine:1300]  BLOOD ADMINISTERED:Two FFP and one PLTS  DRAINS: Chest tubes placed in the mediastinal and pleural spaces   COUNTS CORRECT:  YES  DICTATION: .Dragon Dictation  PLAN OF CARE: Admit to inpatient   PATIENT DISPOSITION:  ICU - intubated and hemodynamically stable.   Delay start of Pharmacological VTE agent (>24hrs) due to surgical blood loss or risk of bleeding: yes  BASELINE WEIGHT: 101 kg

## 2016-01-02 NOTE — Procedures (Signed)
Extubation Procedure Note  Patient Details:   Name: Jorge Adams DOB: May 30, 1951 MRN: [id number]   Airway Documentation:     Evaluation  O2 sats: stable throughout Complications: No apparent complications Patient did tolerate procedure well. Bilateral Breath Sounds: Clear, Diminished   Yes, pt able to speak in whispered voice.    Pt passed NIF and VC and was positive for cuff leak. Extubation with no apparent complications. Pt placed on 4L [place]. Will continue to monitor.   Danella Deis Barnet Benavides 01/02/2016, 7:53 PM

## 2016-01-02 NOTE — Progress Notes (Signed)
MD Prescott Gum at bedside to pull Swan to 50.  Levon Hedger, RN

## 2016-01-02 NOTE — Progress Notes (Signed)
Patient ID: Jorge Adams, male   DOB: 12/20/1950, 65 y.o.   MRN: [id number] EVENING ROUNDS NOTE :     [address]       [place] [address]             [phone]                 Day of Surgery Procedure(s) (LRB): CORONARY ARTERY BYPASS GRAFTING (CABG) TIMES 3 USING LEFT INTERNAL MAMMARY ARTERY AND RIGHT SAPHENOUS LEG VEIN HARVESTED ENDOSCOPICALLY (N/A) TRANSESOPHAGEAL ECHOCARDIOGRAM (TEE) (N/A)  Total Length of Stay:  LOS: 0 days  BP 105/72 mmHg  Pulse 90  Temp(Src) 98.8 F (37.1 C) (Core (Comment))  Resp 20  Ht '5\' 11"'$  (1.803 m)  Wt 224 lb 11 oz (101.918 kg)  BMI 31.35 kg/m2  SpO2 97%  .Intake/Output      06/12 0701 - 06/13 0700 06/13 0701 - 06/14 0700   I.V. (mL/kg)  2078.8 (20.4)   Blood  650   NG/GT  30   IV Piggyback  1600   Total Intake(mL/kg)  4358.8 (42.8)   Urine (mL/kg/hr)  3130 (2.6)   Blood  1800 (1.5)   Chest Tube  395 (0.3)   Total Output   5325   Net   -966.2          . sodium chloride    . [START ON 01/03/2016] sodium chloride    . sodium chloride 20 mL/hr at 01/02/16 1430  . dexmedetomidine Stopped (01/02/16 1650)  . DOPamine 3 mcg/kg/min (01/02/16 1430)  . insulin (NOVOLIN-R) infusion 2.2 Units/hr (01/02/16 1700)  . lactated ringers 20 mL/hr at 01/02/16 1430  . lactated ringers 20 mL/hr at 01/02/16 1430  . nitroGLYCERIN Stopped (01/02/16 1430)  . phenylephrine (NEO-SYNEPHRINE) Adult infusion Stopped (01/02/16 1700)     Lab Results  Component Value Date   WBC 11.1* 01/02/2016   HGB 12.2* 01/02/2016   HCT 36.0* 01/02/2016   PLT 120* 01/02/2016   GLUCOSE 125* 01/02/2016   CHOL 142 11/29/2015   TRIG 252.0* 11/29/2015   HDL 40.40 11/29/2015   LDLDIRECT 70.0 11/29/2015   LDLCALC 96 11/12/2013   ALT 30 01/01/2016   AST 22 01/01/2016   NA 142 01/02/2016   K 3.7 01/02/2016   CL 102 01/02/2016   CREATININE 0.50* 01/02/2016   BUN 8 01/02/2016   CO2 22 01/01/2016   PSA 0.47 11/12/2013   INR 1.43 01/02/2016   HGBA1C  6.8* 01/01/2016   Hemodynamics stable Diabetic , mild metabolic acidosis, follow up gas pending [place] MD  Beeper [phone] Office [phone] 01/02/2016 6:42 PM

## 2016-01-02 NOTE — Progress Notes (Signed)
Sacral Pad not applied due to red rash around anal area going up back slightly.

## 2016-01-02 NOTE — Anesthesia Postprocedure Evaluation (Signed)
Anesthesia Post Note  Patient: Jorge Adams  Procedure(s) Performed: Procedure(s) (LRB): CORONARY ARTERY BYPASS GRAFTING (CABG) TIMES 3 USING LEFT INTERNAL MAMMARY ARTERY AND RIGHT SAPHENOUS LEG VEIN HARVESTED ENDOSCOPICALLY (N/A) TRANSESOPHAGEAL ECHOCARDIOGRAM (TEE) (N/A)  Patient location during evaluation: SICU Anesthesia Type: General Level of consciousness: sedated Pain management: pain level controlled Vital Signs Assessment: post-procedure vital signs reviewed and stable Respiratory status: patient remains intubated per anesthesia plan Cardiovascular status: stable Anesthetic complications: no    Last Vitals:  Filed Vitals:   01/02/16 0619 01/02/16 1425  BP: 162/78 104/74  Pulse: 58 80  Temp: 36.9 C   Resp: 18 12    Last Pain: There were no vitals filed for this visit.               Donye Dauenhauer,W. EDMOND

## 2016-01-02 NOTE — Anesthesia Procedure Notes (Addendum)
Central Venous Catheter Insertion Performed by: anesthesiologist 01/02/2016 7:00 AM Patient location: Pre-op. Preanesthetic checklist: patient identified, IV checked, site marked, risks and benefits discussed, surgical consent, monitors and equipment checked, pre-op evaluation, timeout performed and anesthesia consent Position: Trendelenburg Lidocaine 1% used for infiltration Landmarks identified and Seldinger technique used Catheter size: 9 Fr Central line and PA cath was placed.MAC introducer Swan type and PA catheter depth:thermodilution and 50PA Cath depth:50 Procedure performed using ultrasound guided technique. Attempts: 1 Following insertion, line sutured and dressing applied. Post procedure assessment: blood return through all ports, free fluid flow and no air. Patient tolerated the procedure well with no immediate complications.   Procedure Name: Intubation Date/Time: 01/02/2016 7:56 AM Performed by: Rejeana Brock L Pre-anesthesia Checklist: Patient identified, Timeout performed, Emergency Drugs available, Suction available and Patient being monitored Patient Re-evaluated:Patient Re-evaluated prior to inductionOxygen Delivery Method: Circle system utilized Preoxygenation: Pre-oxygenation with 100% oxygen Intubation Type: IV induction Ventilation: Mask ventilation without difficulty and Oral airway inserted - appropriate to patient size Laryngoscope Size: Mac and 4 Grade View: Grade II Tube type: Oral Tube size: 8.0 mm Number of attempts: 1 Airway Equipment and Method: Stylet Placement Confirmation: ETT inserted through vocal cords under direct vision,  positive ETCO2 and breath sounds checked- equal and bilateral Secured at: 23 cm Tube secured with: Tape Dental Injury: Teeth and Oropharynx as per pre-operative assessment

## 2016-01-02 NOTE — Progress Notes (Addendum)
Withdrew ET tube 2 cm per Dr Darcey Nora.  Now at 21 cm.

## 2016-01-03 ENCOUNTER — Inpatient Hospital Stay (HOSPITAL_COMMUNITY): Payer: 59

## 2016-01-03 ENCOUNTER — Encounter (HOSPITAL_COMMUNITY): Payer: Self-pay | Admitting: Cardiothoracic Surgery

## 2016-01-03 LAB — GLUCOSE, CAPILLARY
Glucose-Capillary: 104 mg/dL — ABNORMAL HIGH (ref 65–99)
Glucose-Capillary: 105 mg/dL — ABNORMAL HIGH (ref 65–99)
Glucose-Capillary: 107 mg/dL — ABNORMAL HIGH (ref 65–99)
Glucose-Capillary: 108 mg/dL — ABNORMAL HIGH (ref 65–99)
Glucose-Capillary: 109 mg/dL — ABNORMAL HIGH (ref 65–99)
Glucose-Capillary: 110 mg/dL — ABNORMAL HIGH (ref 65–99)
Glucose-Capillary: 112 mg/dL — ABNORMAL HIGH (ref 65–99)
Glucose-Capillary: 114 mg/dL — ABNORMAL HIGH (ref 65–99)
Glucose-Capillary: 114 mg/dL — ABNORMAL HIGH (ref 65–99)
Glucose-Capillary: 115 mg/dL — ABNORMAL HIGH (ref 65–99)
Glucose-Capillary: 119 mg/dL — ABNORMAL HIGH (ref 65–99)
Glucose-Capillary: 120 mg/dL — ABNORMAL HIGH (ref 65–99)
Glucose-Capillary: 142 mg/dL — ABNORMAL HIGH (ref 65–99)
Glucose-Capillary: 145 mg/dL — ABNORMAL HIGH (ref 65–99)
Glucose-Capillary: 157 mg/dL — ABNORMAL HIGH (ref 65–99)
Glucose-Capillary: 184 mg/dL — ABNORMAL HIGH (ref 65–99)
Glucose-Capillary: 210 mg/dL — ABNORMAL HIGH (ref 65–99)
Glucose-Capillary: 242 mg/dL — ABNORMAL HIGH (ref 65–99)
Glucose-Capillary: 95 mg/dL (ref 65–99)
Glucose-Capillary: 97 mg/dL (ref 65–99)

## 2016-01-03 LAB — CREATININE, SERUM
CREATININE: 0.91 mg/dL (ref 0.61–1.24)
GFR calc Af Amer: >60 mL/min (ref >60)
GFR calc non Af Amer: >60 mL/min (ref >60)

## 2016-01-03 LAB — POCT I-STAT, CHEM 8
BUN: 11 mg/dL (ref 6–20)
Calcium, Ion: 1.21 mmol/L (ref 1.13–1.30)
Chloride: 103 mmol/L (ref 101–111)
Creatinine, Ser: 0.9 mg/dL (ref 0.61–1.24)
Glucose, Bld: 107 mg/dL — ABNORMAL HIGH (ref 65–99)
HCT: 33 % — ABNORMAL LOW (ref 39.0–52.0)
Hemoglobin: 11.2 g/dL — ABNORMAL LOW (ref 13.0–17.0)
Potassium: 4.6 mmol/L (ref 3.5–5.1)
Sodium: 139 mmol/L (ref 135–145)
TCO2: 25 mmol/L (ref 0–100)

## 2016-01-03 LAB — CBC
HCT: 34.5 % — ABNORMAL LOW (ref 39.0–52.0)
HEMATOCRIT: 34.6 % — AB (ref 39.0–52.0)
HEMOGLOBIN: 11 g/dL — AB (ref 13.0–17.0)
HEMOGLOBIN: 10.9 g/dL — AB (ref 13.0–17.0)
MCH: 26.8 pg (ref 26.0–34.0)
MCH: 27.3 pg (ref 26.0–34.0)
MCHC: 31.8 g/dL (ref 30.0–36.0)
MCHC: 31.6 g/dL (ref 30.0–36.0)
MCV: 84.2 fL (ref 78.0–100.0)
MCV: 86.5 fL (ref 78.0–100.0)
Platelets: 136 10*3/uL — ABNORMAL LOW (ref 150–400)
Platelets: 120 10*3/uL — ABNORMAL LOW (ref 150–400)
RBC: 4.11 MIL/uL — AB (ref 4.22–5.81)
RBC: 3.99 MIL/uL — ABNORMAL LOW (ref 4.22–5.81)
RDW: 15.1 % (ref 11.5–15.5)
RDW: 15.6 % — ABNORMAL HIGH (ref 11.5–15.5)
WBC: 9.8 10*3/uL (ref 4.0–10.5)
WBC: 9.9 10*3/uL (ref 4.0–10.5)

## 2016-01-03 LAB — BASIC METABOLIC PANEL
ANION GAP: 6 (ref 5–15)
BUN: 8 mg/dL (ref 6–20)
CHLORIDE: 108 mmol/L (ref 101–111)
CO2: 24 mmol/L (ref 22–32)
Calcium: 8.1 mg/dL — ABNORMAL LOW (ref 8.9–10.3)
Creatinine, Ser: 0.74 mg/dL (ref 0.61–1.24)
GFR calc non Af Amer: >60 mL/min (ref >60)
Glucose, Bld: 115 mg/dL — ABNORMAL HIGH (ref 65–99)
POTASSIUM: 3.7 mmol/L (ref 3.5–5.1)
SODIUM: 138 mmol/L (ref 135–145)

## 2016-01-03 LAB — MAGNESIUM
Magnesium: 2.4 mg/dL (ref 1.7–2.4)
Magnesium: 2.4 mg/dL (ref 1.7–2.4)

## 2016-01-03 MED ORDER — INSULIN DETEMIR 100 UNIT/ML ~~LOC~~ SOLN
20.0000 [IU] | Freq: Two times a day (BID) | SUBCUTANEOUS | Status: DC
  Administered 2016-01-03 – 2016-01-07 (×8): 20 [IU] via SUBCUTANEOUS
  Filled 2016-01-03 (×9): qty 0.2

## 2016-01-03 MED ORDER — POTASSIUM CHLORIDE 10 MEQ/50ML IV SOLN
10.0000 meq | INTRAVENOUS | Status: AC | PRN
  Administered 2016-01-03 (×3): 10 meq via INTRAVENOUS
  Filled 2016-01-03: qty 50

## 2016-01-03 MED ORDER — MIDAZOLAM HCL 2 MG/2ML IJ SOLN: 2.0000 mg | INTRAMUSCULAR | Status: DC | PRN

## 2016-01-03 MED ORDER — VANCOMYCIN HCL IN DEXTROSE 1-5 GM/200ML-% IV SOLN
1000.0000 mg | Freq: Once | INTRAVENOUS | Status: AC
  Administered 2016-01-03: 1000 mg via INTRAVENOUS
  Filled 2016-01-03: qty 200

## 2016-01-03 MED ORDER — ALBUMIN HUMAN 25 % IV SOLN
12.5000 g | Freq: Once | INTRAVENOUS | Status: AC
  Administered 2016-01-03: 12.5 g via INTRAVENOUS
  Filled 2016-01-03: qty 50

## 2016-01-03 MED ORDER — MORPHINE SULFATE (PF) 2 MG/ML IV SOLN: 1.0000 mg | INTRAVENOUS | Status: AC | PRN

## 2016-01-03 MED ORDER — INSULIN ASPART 100 UNIT/ML ~~LOC~~ SOLN
0.0000 [IU] | SUBCUTANEOUS | Status: DC
  Administered 2016-01-03: 8 [IU] via SUBCUTANEOUS
  Administered 2016-01-04 (×2): 2 [IU] via SUBCUTANEOUS
  Administered 2016-01-04: 8 [IU] via SUBCUTANEOUS
  Administered 2016-01-04: 12 [IU] via SUBCUTANEOUS
  Administered 2016-01-04: 8 [IU] via SUBCUTANEOUS
  Administered 2016-01-04 – 2016-01-05 (×2): 2 [IU] via SUBCUTANEOUS
  Administered 2016-01-05: 4 [IU] via SUBCUTANEOUS
  Administered 2016-01-05 – 2016-01-06 (×4): 2 [IU] via SUBCUTANEOUS
  Administered 2016-01-06 (×4): 4 [IU] via SUBCUTANEOUS
  Administered 2016-01-07: 8 [IU] via SUBCUTANEOUS
  Administered 2016-01-07 (×2): 4 [IU] via SUBCUTANEOUS
  Administered 2016-01-07 (×2): 8 [IU] via SUBCUTANEOUS
  Administered 2016-01-07: 4 [IU] via SUBCUTANEOUS
  Administered 2016-01-08: 8 [IU] via SUBCUTANEOUS

## 2016-01-03 MED ORDER — FUROSEMIDE 10 MG/ML IJ SOLN
20.0000 mg | Freq: Two times a day (BID) | INTRAMUSCULAR | Status: DC
  Administered 2016-01-03 – 2016-01-09 (×14): 20 mg via INTRAVENOUS
  Filled 2016-01-03 (×14): qty 2

## 2016-01-03 MED ORDER — CETYLPYRIDINIUM CHLORIDE 0.05 % MT LIQD
7.0000 mL | Freq: Two times a day (BID) | OROMUCOSAL | Status: DC
  Administered 2016-01-04 – 2016-01-11 (×13): 7 mL via OROMUCOSAL

## 2016-01-03 MED ORDER — INSULIN ASPART 100 UNIT/ML ~~LOC~~ SOLN
4.0000 [IU] | Freq: Three times a day (TID) | SUBCUTANEOUS | Status: DC
  Administered 2016-01-03 – 2016-01-04 (×4): 4 [IU] via SUBCUTANEOUS

## 2016-01-03 MED ORDER — METOCLOPRAMIDE HCL 5 MG/ML IJ SOLN
10.0000 mg | Freq: Four times a day (QID) | INTRAMUSCULAR | Status: DC
  Administered 2016-01-03 – 2016-01-11 (×28): 10 mg via INTRAVENOUS
  Filled 2016-01-03 (×31): qty 2

## 2016-01-03 MED ORDER — INSULIN DETEMIR 100 UNIT/ML ~~LOC~~ SOLN
15.0000 [IU] | Freq: Two times a day (BID) | SUBCUTANEOUS | Status: DC
  Administered 2016-01-03: 15 [IU] via SUBCUTANEOUS
  Filled 2016-01-03 (×2): qty 0.15

## 2016-01-03 NOTE — Progress Notes (Signed)
CT surgery p.m. Rounds  Patient maintaining sinus rhythm Still requires Neo-Synephrine for blood pressure P.m. labs are stable but glucoses rising Patient to receive second dose of Levemir insulin this evening

## 2016-01-03 NOTE — Progress Notes (Signed)
1 Day Post-Op Procedure(s) (LRB): CORONARY ARTERY BYPASS GRAFTING (CABG) TIMES 3 USING LEFT INTERNAL MAMMARY ARTERY AND RIGHT SAPHENOUS LEG VEIN HARVESTED ENDOSCOPICALLY (N/A) TRANSESOPHAGEAL ECHOCARDIOGRAM (TEE) (N/A) Subjective: Alert, comfortable nsr On lowe dose neo  Objective: Vital signs in last 24 hours: Temp:  [97.3 F (36.3 C)-99.9 F (37.7 C)] 99.7 F (37.6 C) (06/14 0715) Pulse Rate:  [25-102] 87 (06/14 0715) Cardiac Rhythm:  [-] Normal sinus rhythm (06/14 0748) Resp:  [12-30] 15 (06/14 0715) BP: (86-121)/(55-85) 103/68 mmHg (06/14 0715) SpO2:  [92 %-98 %] 97 % (06/14 0715) Arterial Line BP: (72-145)/(48-139) 108/50 mmHg (06/14 0715) FiO2 (%):  [40 %-50 %] 40 % (06/13 1842) Weight:  [227 lb 11.2 oz (103.284 kg)] 227 lb 11.2 oz (103.284 kg) (06/14 0453)  Hemodynamic parameters for last 24 hours: PAP: (21-48)/(10-26) 31/18 mmHg CO:  [4.4 L/min-6 L/min] 4.8 L/min CI:  [2 L/min/m2-2.7 L/min/m2] 2.2 L/min/m2  Intake/Output from previous day: 06/13 0701 - 06/14 0700 In: 5572.6 [I.V.:2892.6; Blood:650; NG/GT:30; IV Piggyback:2000] Out: 7110 [Urine:4670; Emesis/NG output:125; Blood:1800; Chest Tube:515] Intake/Output this shift: Total I/O In: 53.3 [I.V.:53.3] Out: 40 [Urine:40]       Exam    General- alert and comfortable   Lungs- clear without rales, wheezes   Cor- regular rate and rhythm, no murmur , gallop   Abdomen- soft, non-tender   Extremities - warm, non-tender, minimal edema   Neuro- oriented, appropriate, no focal weakness   Lab Results:  Recent Labs  01/02/16 2010 01/02/16 2013 01/03/16 0412  WBC 9.5  --  9.8  HGB 11.4* 10.9* 11.0*  HCT 34.6* 32.0* 34.6*  PLT 118*  --  136*   BMET:  Recent Labs  01/01/16 1530  01/02/16 2013 01/03/16 0412  NA 139  < > 137 138  K 3.9  < > 3.6 3.7  CL 107  < > 107 108  CO2 22  --   --  24  GLUCOSE 114*  < > 154* 115*  BUN 10  < > 6 8  CREATININE 0.71  < > 0.50* 0.74  CALCIUM 9.6  --   --  8.1*  < > =  values in this interval not displayed.  PT/INR:  Recent Labs  01/02/16 1430  LABPROT 17.5*  INR 1.43   ABG    Component Value Date/Time   PHART 7.326* 01/02/2016 2059   HCO3 21.8 01/02/2016 2059   TCO2 23 01/02/2016 2059   ACIDBASEDEF 4.0* 01/02/2016 2059   O2SAT 89.0 01/02/2016 2059   CBG (last 3)   Recent Labs  01/03/16 0555 01/03/16 0659 01/03/16 0804  GLUCAP 112* 110* 97    Assessment/Plan: S/P Procedure(s) (LRB): CORONARY ARTERY BYPASS GRAFTING (CABG) TIMES 3 USING LEFT INTERNAL MAMMARY ARTERY AND RIGHT SAPHENOUS LEG VEIN HARVESTED ENDOSCOPICALLY (N/A) TRANSESOPHAGEAL ECHOCARDIOGRAM (TEE) (N/A) Mobilize Diuresis Diabetes control d/c tubes/lines See progression orders   LOS: 1 day    Jorge Adams 01/03/2016

## 2016-01-03 NOTE — Op Note (Signed)
[name], Adams NO.:  [id number]  MEDICAL RECORD NO.:  [id number]  LOCATION:  2S06C                        FACILITY:  [facility]  PHYSICIAN:  Ivin Poot, M.D.  DATE OF BIRTH:  06/13/1951  DATE OF PROCEDURE:  01/02/2016 DATE OF DISCHARGE:                              OPERATIVE REPORT   OPERATIONS: 1. Coronary artery bypass grafting x3 (left internal mammary artery to     left anterior descending artery, saphenous vein graft to obtuse     marginal 1, saphenous vein graft to posterior descending). 2. Endoscopic harvest of right leg greater saphenous vein.  SURGEON:  Ivin Poot, M.D.  ASSISTANT:  Lars Pinks, PA-C.  ANESTHESIA:  General by Dr. Oren Bracket.  PREOPERATIVE DIAGNOSIS:  Severe three-vessel coronary artery disease with progressive class III angina with a positive stress test.  POSTOPERATIVE DIAGNOSIS:  Severe three-vessel coronary artery disease with progressive class III angina with a positive stress test.  INDICATIONS:  The patient is a 65 year old obese, diabetic reformed smoker with previous PCI of the circumflex and RCA by Dr. Einar Gip.  He recently developed claudication in his left leg.  He was noted to have recurrent exertional angina, so, Dr. Einar Gip did repeat stress test, which was positive for ischemia.  He subsequently underwent repeat cardiac catheterization, which demonstrated chronic occlusion of his RCA with reconstitution of the posterior descending via collaterals, high-grade 95% stenosis of the proximal LAD, high-grade stenosis of the OM distal to the previously-placed stent.  Overall, LV function was well preserved.  The patient was felt to be candidate for surgical coronary revascularization based on his coronary anatomy, positive stress test, and symptoms of angina.  I examined the patient in the office and reviewed the results of his cardiac cath and echo.  I discussed the indications and expected  benefits of coronary artery bypass surgery for treatment of his coronary artery disease.  I reviewed the major aspects of the operation including the use of general anesthesia and cardiopulmonary bypass, the location of the surgical incision, and the expected postoperative hospital recovery.  I discussed with the patient the risks to him of the operation including the risks of MI, stroke, bleeding, blood transfusion requirement, postoperative pulmonary problems including pleural effusion, infection, and death.  After reviewing these issues, he demonstrated his understanding and agreed to proceed with surgery under what I felt was an informed consent.  OPERATIVE FINDINGS: 1. The saphenous vein was harvested from the right leg.  The right leg     vein was with multiple branches and took extended periods of time     and tedious, endoscopic dissection to remove.  It was adequate, but     on the small side.  The left leg vein was avoided because of severe     peripheral vascular disease and low ABI index on the left foot. 2. The left IMA was a small vessel, but with good flow. 3. The coronary targets were small, suboptimal with diffuse disease. 4. No blood products were required for the surgery. 5. LV function was normal after successful completion of CABG x3 and     separation from cardiopulmonary bypass.  OPERATIVE PROCEDURE:  The patient was  brought to the operating room and placed in the supine position on the operating table.  General anesthesia was induced and the transesophageal echoprobe was placed by the anesthesiologist and the patient was prepped and draped as a sterile field and a proper time-out was performed.  A sternal incision was made. The saphenous vein was harvested endoscopically from the right leg.  The left internal mammary artery was harvested as a pedicle graft from its origin at the subclavian vessels.  It was a small vessel, but had good flow.  The sternal  retractor was placed.  The pericardium was opened and suspended.  Pursestrings were placed in the ascending aorta and right atrium.  When the vein was harvested, the patient was heparinized and cannulated and placed on cardiopulmonary bypass.  The coronaries were identified for grafting, and the mammary artery and vein grafts were prepared for the distal anastomosis.  Cardioplegia cannulas were placed both antegrade and retrograde cold blood cardioplegia.  The patient was cooled to 32 degrees.  Aortic crossclamp was applied.  A liter of cold blood cardioplegia was delivered in the split doses between the antegrade aortic and retrograde coronary sinus catheters.  There was good cardioplegic arrest and septal temperature dropped less than 12 degrees.  Cardioplegia was delivered every 20 minutes while the crossclamp was in place.  The distal coronary anastomoses were performed.  The first distal anastomosis was to the posterior descending.  This was totally occluded proximally.  A reverse saphenous vein was sewn end-to-side with running 7-0 Prolene with excellent flow through the graft.  Cardioplegia was redosed.  The second distal anastomosis was to the OM branch of the circumflex. This had proximally placed stents.  There was a high-grade 90% stenosis. It was intramyocardial.  The vessel quality was poor.  A reverse saphenous vein was sewn end-to-side carefully with 7-0 Prolene and there was good flow through the graft.  Cardioplegia was redosed.  The third distal anastomosis was to the distal third of the LAD.  It was a 1.5-mm vessel.  The left IMA pedicle was brought through an opening and the left lateral pericardium was brought down onto the LAD and sewn end-to-side with running 8-0 Prolene.  There was good flow through the anastomosis after briefly releasing the bulldog on the mammary pedicle. The bulldog was reapplied and the pedicle was secured to the epicardium. Cardioplegia  was redosed.  While the crossclamp was still in place, two proximal vein anastomoses were performed on the ascending aorta with a 4.5-mm punch and running 6- 0 Prolene.  Prior to releasing the crossclamp, air was evacuated from the coronaries with a dose of retrograde warm blood cardioplegia.  The crossclamp was removed and the heart resumed a spontaneous rhythm. The vein grafts were de-aired and opened and each had good flow, and hemostasis was documented at the proximal and distal anastomoses. Cardioplegia cannulas were removed.  The patient was rewarmed and reperfused.  Temporary pacing wires were applied.  When the patient was adequately reperfused, the lungs were expanded, the ventilator was resumed.  The patient was weaned from cardiopulmonary bypass without difficulty.  Echo showed normal LV function.  Protamine was administered without adverse reaction.  The cannulas were removed.  The mediastinum was irrigated with warm saline. The superior pericardial fat was closed over the aorta.  Anterior mediastinal and left pleural chest tube were placed and brought out through separate incisions.  The sternum was closed with wire.  The pectoralis fascia was closed with a running #  1 Vicryl.  The subcutaneous and skin layers were closed with a running Vicryl.  Total cardiopulmonary bypass time was 140 minutes.     Ivin Poot, M.D.     PV/MEDQ  D:  01/02/2016  T:  01/03/2016  Job:  [id number]  cc:   Laverda Page, MD

## 2016-01-04 ENCOUNTER — Inpatient Hospital Stay (HOSPITAL_COMMUNITY): Payer: 59

## 2016-01-04 LAB — CBC
HCT: 31.9 % — ABNORMAL LOW (ref 39.0–52.0)
Hemoglobin: 10.1 g/dL — ABNORMAL LOW (ref 13.0–17.0)
MCH: 27.5 pg (ref 26.0–34.0)
MCHC: 31.7 g/dL (ref 30.0–36.0)
MCV: 86.9 fL (ref 78.0–100.0)
Platelets: 144 10*3/uL — ABNORMAL LOW (ref 150–400)
RBC: 3.67 MIL/uL — ABNORMAL LOW (ref 4.22–5.81)
RDW: 15.7 % — ABNORMAL HIGH (ref 11.5–15.5)
WBC: 12 10*3/uL — ABNORMAL HIGH (ref 4.0–10.5)

## 2016-01-04 LAB — GLUCOSE, CAPILLARY
Glucose-Capillary: 106 mg/dL — ABNORMAL HIGH (ref 65–99)
Glucose-Capillary: 124 mg/dL — ABNORMAL HIGH (ref 65–99)
Glucose-Capillary: 146 mg/dL — ABNORMAL HIGH (ref 65–99)
Glucose-Capillary: 222 mg/dL — ABNORMAL HIGH (ref 65–99)
Glucose-Capillary: 232 mg/dL — ABNORMAL HIGH (ref 65–99)
Glucose-Capillary: 276 mg/dL — ABNORMAL HIGH (ref 65–99)

## 2016-01-04 LAB — POCT I-STAT, CHEM 8
BUN: 14 mg/dL (ref 6–20)
Calcium, Ion: 1.25 mmol/L (ref 1.13–1.30)
Chloride: 97 mmol/L — ABNORMAL LOW (ref 101–111)
Creatinine, Ser: 0.8 mg/dL (ref 0.61–1.24)
Glucose, Bld: 242 mg/dL — ABNORMAL HIGH (ref 65–99)
HCT: 30 % — ABNORMAL LOW (ref 39.0–52.0)
Hemoglobin: 10.2 g/dL — ABNORMAL LOW (ref 13.0–17.0)
Potassium: 4.5 mmol/L (ref 3.5–5.1)
Sodium: 135 mmol/L (ref 135–145)
TCO2: 27 mmol/L (ref 0–100)

## 2016-01-04 LAB — BASIC METABOLIC PANEL
Anion gap: 5 (ref 5–15)
BUN: 11 mg/dL (ref 6–20)
CO2: 28 mmol/L (ref 22–32)
Calcium: 8.5 mg/dL — ABNORMAL LOW (ref 8.9–10.3)
Chloride: 104 mmol/L (ref 101–111)
Creatinine, Ser: 0.8 mg/dL (ref 0.61–1.24)
GFR calc Af Amer: >60 mL/min (ref >60)
GFR calc non Af Amer: >60 mL/min (ref >60)
Glucose, Bld: 128 mg/dL — ABNORMAL HIGH (ref 65–99)
Potassium: 4.2 mmol/L (ref 3.5–5.1)
Sodium: 137 mmol/L (ref 135–145)

## 2016-01-04 MED ORDER — DOPAMINE-DEXTROSE 3.2-5 MG/ML-% IV SOLN
INTRAVENOUS | Status: AC
  Administered 2016-01-04: 800 mg
  Filled 2016-01-04: qty 250

## 2016-01-04 MED ORDER — SIMETHICONE 80 MG PO CHEW
80.0000 mg | CHEWABLE_TABLET | Freq: Four times a day (QID) | ORAL | Status: DC
  Administered 2016-01-04 – 2016-01-11 (×29): 80 mg via ORAL
  Filled 2016-01-04 (×29): qty 1

## 2016-01-04 MED ORDER — INSULIN ASPART 100 UNIT/ML ~~LOC~~ SOLN
8.0000 [IU] | Freq: Three times a day (TID) | SUBCUTANEOUS | Status: DC
  Administered 2016-01-04: 8 [IU] via SUBCUTANEOUS

## 2016-01-04 MED ORDER — DOPAMINE-DEXTROSE 3.2-5 MG/ML-% IV SOLN
1.2500 ug/kg/min | INTRAVENOUS | Status: DC
  Filled 2016-01-04: qty 250

## 2016-01-04 NOTE — Progress Notes (Signed)
2 Days Post-Op Procedure(s) (LRB): CORONARY ARTERY BYPASS GRAFTING (CABG) TIMES 3 USING LEFT INTERNAL MAMMARY ARTERY AND RIGHT SAPHENOUS LEG VEIN HARVESTED ENDOSCOPICALLY (N/A) TRANSESOPHAGEAL ECHOCARDIOGRAM (TEE) (N/A) Subjective: Low BP overnite Low dose dopamine started as little response to neo Ileus on exam- narcotics reduced  Objective: Vital signs in last 24 hours: Temp:  [97.6 F (36.4 C)-99.4 F (37.4 C)] 98.6 F (37 C) (06/15 1148) Pulse Rate:  [78-111] 87 (06/15 1002) Cardiac Rhythm:  [-] Sinus tachycardia (06/15 0943) Resp:  [9-24] 20 (06/15 1002) BP: (78-140)/(47-80) 94/49 mmHg (06/15 1002) SpO2:  [92 %-98 %] 94 % (06/15 1100) Arterial Line BP: (80-120)/(41-56) 91/41 mmHg (06/14 1530) Weight:  [228 lb 12.8 oz (103.783 kg)] 228 lb 12.8 oz (103.783 kg) (06/15 0500)  Hemodynamic parameters for last 24 hours:  nsr  Intake/Output from previous day: 06/14 0701 - 06/15 0700 In: 2412.3 [P.O.:1130; I.V.:882.3; IV Piggyback:400] Out: 2175 [Urine:2085; Chest Tube:90] Intake/Output this shift: Total I/O In: 89.6 [I.V.:89.6] Out: 800 [Urine:800]       Exam    General- alert and comfortable   Lungs- clear without rales, wheezes   Cor- regular rate and rhythm, no murmur , gallop   Abdomen- soft, non-tender   Extremities - warm, non-tender, minimal edema   Neuro- oriented, appropriate, no focal weakness   Lab Results:  Recent Labs  01/03/16 1720 01/04/16 0514  WBC 9.9 12.0*  HGB 10.9* 10.1*  HCT 34.5* 31.9*  PLT 120* 144*   BMET:  Recent Labs  01/03/16 0412 01/03/16 1714 01/03/16 1720 01/04/16 0514  NA 138 139  --  137  K 3.7 4.6  --  4.2  CL 108 103  --  104  CO2 24  --   --  28  GLUCOSE 115* 107*  --  128*  BUN 8 11  --  11  CREATININE 0.74 0.90 0.91 0.80  CALCIUM 8.1*  --   --  8.5*    PT/INR:  Recent Labs  01/02/16 1430  LABPROT 17.5*  INR 1.43   ABG    Component Value Date/Time   PHART 7.326* 01/02/2016 2059   HCO3 21.8 01/02/2016  2059   TCO2 25 01/03/2016 1714   ACIDBASEDEF 4.0* 01/02/2016 2059   O2SAT 89.0 01/02/2016 2059   CBG (last 3)   Recent Labs  01/04/16 0352 01/04/16 0812 01/04/16 1147  GLUCAP 124* 146* 232*    Assessment/Plan: S/P Procedure(s) (LRB): CORONARY ARTERY BYPASS GRAFTING (CABG) TIMES 3 USING LEFT INTERNAL MAMMARY ARTERY AND RIGHT SAPHENOUS LEG VEIN HARVESTED ENDOSCOPICALLY (N/A) TRANSESOPHAGEAL ECHOCARDIOGRAM (TEE) (N/A) Mobilize Diuresis Diabetes control do not advance diet, KUB ordered   LOS: 2 days    Jorge Adams 01/04/2016

## 2016-01-04 NOTE — Progress Notes (Signed)
T[facility] BRIEF SICU PROGRESS NOTE  2 Days Post-Op  S/P Procedure(s) (LRB): CORONARY ARTERY BYPASS GRAFTING (CABG) TIMES 3 USING LEFT INTERNAL MAMMARY ARTERY AND RIGHT SAPHENOUS LEG VEIN HARVESTED ENDOSCOPICALLY (N/A) TRANSESOPHAGEAL ECHOCARDIOGRAM (TEE) (N/A)   Stable day NSR w/ stable BP on low dose dopamine Breathing comfortably w/ O2 sats 96-100% UOP adequate  Plan: Continue current plan  Rexene Alberts, MD 01/04/2016 8:14 PM

## 2016-01-05 ENCOUNTER — Inpatient Hospital Stay (HOSPITAL_COMMUNITY): Payer: 59

## 2016-01-05 LAB — COMPREHENSIVE METABOLIC PANEL
ALT: 19 U/L (ref 17–63)
AST: 23 U/L (ref 15–41)
Albumin: 2.8 g/dL — ABNORMAL LOW (ref 3.5–5.0)
Alkaline Phosphatase: 52 U/L (ref 38–126)
Anion gap: 4 — ABNORMAL LOW (ref 5–15)
BUN: 11 mg/dL (ref 6–20)
CO2: 30 mmol/L (ref 22–32)
Calcium: 8.6 mg/dL — ABNORMAL LOW (ref 8.9–10.3)
Chloride: 103 mmol/L (ref 101–111)
Creatinine, Ser: 0.7 mg/dL (ref 0.61–1.24)
GFR calc Af Amer: >60 mL/min (ref >60)
GFR calc non Af Amer: >60 mL/min (ref >60)
Glucose, Bld: 102 mg/dL — ABNORMAL HIGH (ref 65–99)
Potassium: 4.1 mmol/L (ref 3.5–5.1)
Sodium: 137 mmol/L (ref 135–145)
Total Bilirubin: 0.8 mg/dL (ref 0.3–1.2)
Total Protein: 5.6 g/dL — ABNORMAL LOW (ref 6.5–8.1)

## 2016-01-05 LAB — POCT I-STAT, CHEM 8
BUN: 13 mg/dL (ref 6–20)
Calcium, Ion: 1.22 mmol/L (ref 1.13–1.30)
Chloride: 96 mmol/L — ABNORMAL LOW (ref 101–111)
Creatinine, Ser: 0.7 mg/dL (ref 0.61–1.24)
Glucose, Bld: 94 mg/dL (ref 65–99)
HCT: 29 % — ABNORMAL LOW (ref 39.0–52.0)
Hemoglobin: 9.9 g/dL — ABNORMAL LOW (ref 13.0–17.0)
Potassium: 4.1 mmol/L (ref 3.5–5.1)
Sodium: 138 mmol/L (ref 135–145)
TCO2: 29 mmol/L (ref 0–100)

## 2016-01-05 LAB — GLUCOSE, CAPILLARY
Glucose-Capillary: 90 mg/dL (ref 65–99)
Glucose-Capillary: 133 mg/dL — ABNORMAL HIGH (ref 65–99)
Glucose-Capillary: 126 mg/dL — ABNORMAL HIGH (ref 65–99)
Glucose-Capillary: 89 mg/dL (ref 65–99)
Glucose-Capillary: 154 mg/dL — ABNORMAL HIGH (ref 65–99)

## 2016-01-05 LAB — CBC
HCT: 29.7 % — ABNORMAL LOW (ref 39.0–52.0)
Hemoglobin: 9.4 g/dL — ABNORMAL LOW (ref 13.0–17.0)
MCH: 27 pg (ref 26.0–34.0)
MCHC: 31.6 g/dL (ref 30.0–36.0)
MCV: 85.3 fL (ref 78.0–100.0)
Platelets: 124 10*3/uL — ABNORMAL LOW (ref 150–400)
RBC: 3.48 MIL/uL — ABNORMAL LOW (ref 4.22–5.81)
RDW: 15.2 % (ref 11.5–15.5)
WBC: 8.6 10*3/uL (ref 4.0–10.5)

## 2016-01-05 MED ORDER — TRACE MINERALS CR-CU-MN-SE-ZN 10-1000-500-60 MCG/ML IV SOLN
INTRAVENOUS | Status: AC
  Administered 2016-01-05: 18:00:00 via INTRAVENOUS
  Filled 2016-01-05: qty 960

## 2016-01-05 MED ORDER — SODIUM CHLORIDE 0.9% FLUSH
10.0000 mL | Freq: Two times a day (BID) | INTRAVENOUS | Status: DC
  Administered 2016-01-05 – 2016-01-11 (×8): 10 mL

## 2016-01-05 MED ORDER — AMIODARONE HCL IN DEXTROSE 360-4.14 MG/200ML-% IV SOLN
INTRAVENOUS | Status: AC
  Administered 2016-01-05: 60 mg/h via INTRAVENOUS
  Filled 2016-01-05: qty 200

## 2016-01-05 MED ORDER — ENOXAPARIN SODIUM 40 MG/0.4ML ~~LOC~~ SOLN
40.0000 mg | SUBCUTANEOUS | Status: DC
  Administered 2016-01-05 – 2016-01-11 (×7): 40 mg via SUBCUTANEOUS
  Filled 2016-01-05 (×6): qty 0.4

## 2016-01-05 MED ORDER — TRAVASOL 10 % IV SOLN: INTRAVENOUS | Status: DC

## 2016-01-05 MED ORDER — POTASSIUM CHLORIDE 10 MEQ/50ML IV SOLN
10.0000 meq | INTRAVENOUS | Status: AC
  Administered 2016-01-05 (×2): 10 meq via INTRAVENOUS
  Filled 2016-01-05 (×2): qty 50

## 2016-01-05 MED ORDER — AMIODARONE LOAD VIA INFUSION
150.0000 mg | Freq: Once | INTRAVENOUS | Status: AC
  Administered 2016-01-05: 150 mg via INTRAVENOUS
  Filled 2016-01-05: qty 83.34

## 2016-01-05 MED ORDER — AMIODARONE HCL IN DEXTROSE 360-4.14 MG/200ML-% IV SOLN
60.0000 mg/h | INTRAVENOUS | Status: AC
  Administered 2016-01-05 (×2): 60 mg/h via INTRAVENOUS
  Filled 2016-01-05: qty 200

## 2016-01-05 MED ORDER — SODIUM CHLORIDE 0.9 % IV SOLN
INTRAVENOUS | Status: DC
  Administered 2016-01-05 – 2016-01-07 (×2): via INTRAVENOUS

## 2016-01-05 MED ORDER — AMIODARONE HCL IN DEXTROSE 360-4.14 MG/200ML-% IV SOLN
30.0000 mg/h | INTRAVENOUS | Status: DC
Start: 2016-01-05 — End: 2016-01-08
  Administered 2016-01-05 – 2016-01-07 (×4): 30 mg/h via INTRAVENOUS
  Filled 2016-01-05 (×4): qty 200

## 2016-01-05 MED ORDER — FAT EMULSION 20 % IV EMUL
240.0000 mL | INTRAVENOUS | Status: AC
  Administered 2016-01-05: 240 mL via INTRAVENOUS
  Filled 2016-01-05: qty 250

## 2016-01-05 MED ORDER — SODIUM CHLORIDE 0.9% FLUSH
10.0000 mL | INTRAVENOUS | Status: DC | PRN
  Administered 2016-01-08 – 2016-01-11 (×3): 10 mL
  Filled 2016-01-05 (×3): qty 40

## 2016-01-05 NOTE — Progress Notes (Signed)
Patient ID: Jorge Adams, male   DOB: Aug 17, 1950, 65 y.o.   MRN: [id number]  SICU Evening Rounds:  Hemodynamically stable in sinus rhythm on low dose dopamine.  sats 100%  Urine output ok.   TNA ordered for ileus.

## 2016-01-05 NOTE — Progress Notes (Signed)
3 Days Post-Op Procedure(s) (LRB): CORONARY ARTERY BYPASS GRAFTING (CABG) TIMES 3 USING LEFT INTERNAL MAMMARY ARTERY AND RIGHT SAPHENOUS LEG VEIN HARVESTED ENDOSCOPICALLY (N/A) TRANSESOPHAGEAL ECHOCARDIOGRAM (TEE) (N/A) Subjective: Walking in hall Ileus with hi pitched bowel sounds, abnormal KUB - cont reglan nsr BP better- slow dopamine wean Objective: Vital signs in last 24 hours: Temp:  [97.6 F (36.4 C)-99.2 F (37.3 C)] 99.2 F (37.3 C) (06/16 0405) Pulse Rate:  [86-111] 106 (06/16 0700) Cardiac Rhythm:  [-] Normal sinus rhythm (06/16 0400) Resp:  [11-22] 16 (06/16 0700) BP: (89-140)/(49-80) 121/67 mmHg (06/16 0700) SpO2:  [92 %-100 %] 99 % (06/16 0700) Weight:  [229 lb 4.5 oz (104 kg)] 229 lb 4.5 oz (104 kg) (06/16 0500)  Hemodynamic parameters for last 24 hours:  nsr  Intake/Output from previous day: 06/15 0701 - 06/16 0700 In: 885.6 [P.O.:300; I.V.:585.6] Out: 2875 [Urine:2875] Intake/Output this shift:         Exam    General- alert and comfortable   Lungs- clear without rales, wheezes   Cor- regular rate and rhythm, no murmur , gallop   Abdomen- distended, abnormal bs non-tender   Extremities - warm, non-tender, minimal edema   Neuro- oriented, appropriate, no focal weakness   Lab Results:  Recent Labs  01/04/16 0514 01/04/16 1652 01/05/16 0410  WBC 12.0*  --  8.6  HGB 10.1* 10.2* 9.4*  HCT 31.9* 30.0* 29.7*  PLT 144*  --  124*   BMET:  Recent Labs  01/04/16 0514 01/04/16 1652 01/05/16 0410  NA 137 135 137  K 4.2 4.5 4.1  CL 104 97* 103  CO2 28  --  30  GLUCOSE 128* 242* 102*  BUN '11 14 11  '$ CREATININE 0.80 0.80 0.70  CALCIUM 8.5*  --  8.6*    PT/INR:  Recent Labs  01/02/16 1430  LABPROT 17.5*  INR 1.43   ABG    Component Value Date/Time   PHART 7.326* 01/02/2016 2059   HCO3 21.8 01/02/2016 2059   TCO2 27 01/04/2016 1652   ACIDBASEDEF 4.0* 01/02/2016 2059   O2SAT 89.0 01/02/2016 2059   CBG (last 3)   Recent Labs  01/04/16 1929 01/04/16 2337 01/05/16 0350  GLUCAP 222* 106* 90    Assessment/Plan: S/P Procedure(s) (LRB): CORONARY ARTERY BYPASS GRAFTING (CABG) TIMES 3 USING LEFT INTERNAL MAMMARY ARTERY AND RIGHT SAPHENOUS LEG VEIN HARVESTED ENDOSCOPICALLY (N/A) TRANSESOPHAGEAL ECHOCARDIOGRAM (TEE) (N/A) Mobilize Diuresis Diabetes control start TPN for ileus   LOS: 3 days    Jorge Adams 01/05/2016

## 2016-01-05 NOTE — Significant Event (Addendum)
Delayed in getting EKG due to malfunctioned of EKG machine. Machine would not print out rhythm. A different machine obtained from another unit. EKG obtained at 1832-showed A Fib, rates in the 100s. EKG placed in shadow chart.

## 2016-01-05 NOTE — Progress Notes (Signed)
PARENTERAL NUTRITION CONSULT NOTE - INITIAL  Pharmacy Consult for TPN Indication: ileus  No Known Allergies  Patient Measurements: Height: '5\' 11"'$  (180.3 cm) Weight: 229 lb 4.5 oz (104 kg) IBW/kg (Calculated) : 75.3 Adjusted Body Weight:   84kg  Vital Signs: Temp: 99 F (37.2 C) (06/16 0700) Temp Source: Oral (06/16 0700) BP: 121/67 mmHg (06/16 0700) Pulse Rate: 106 (06/16 0700) Intake/Output from previous day: 06/15 0701 - 06/16 0700 In: 885.6 [P.O.:300; I.V.:585.6] Out: 2875 [Urine:2875] Intake/Output from this shift:    Labs:  Recent Labs  01/02/16 1430  01/03/16 1720 01/04/16 0514 01/04/16 1652 01/05/16 0410  WBC 11.1*  < > 9.9 12.0*  --  8.6  HGB 12.6*  < > 10.9* 10.1* 10.2* 9.4*  HCT 38.0*  < > 34.5* 31.9* 30.0* 29.7*  PLT 120*  < > 120* 144*  --  124*  APTT 27  --   --   --   --   --   INR 1.43  --   --   --   --   --   < > = values in this interval not displayed.   Recent Labs  01/02/16 2010  01/03/16 0412  01/03/16 1720 01/04/16 0514 01/04/16 1652 01/05/16 0410  NA  --   < > 138  < >  --  137 135 137  K 3.8  < > 3.7  < >  --  4.2 4.5 4.1  CL  --   < > 108  < >  --  104 97* 103  CO2  --   --  24  --   --  28  --  30  GLUCOSE  --   < > 115*  < >  --  128* 242* 102*  BUN  --   < > 8  < >  --  '11 14 11  '$ CREATININE 0.71  < > 0.74  < > 0.91 0.80 0.80 0.70  CALCIUM  --   --  8.1*  --   --  8.5*  --  8.6*  MG 2.7*  --  2.4  --  2.4  --   --   --   PROT  --   --   --   --   --   --   --  5.6*  ALBUMIN  --   --   --   --   --   --   --  2.8*  AST  --   --   --   --   --   --   --  23  ALT  --   --   --   --   --   --   --  19  ALKPHOS  --   --   --   --   --   --   --  52  BILITOT  --   --   --   --   --   --   --  0.8  < > = values in this interval not displayed. Estimated Creatinine Clearance: 111.5 mL/min (by C-G formula based on Cr of 0.7).    Recent Labs  01/04/16 1929 01/04/16 2337 01/05/16 0350  GLUCAP 222* 106* 90    Insulin  Requirements in the past 24 hours:  Levemir 20units BID + 46 units SSI  Admit: 19 YOM diagnosed with severe 3 vessel disease and was admitted for CABG. Post-op has developed and ileus.  Surgeries/Procedures: 6/13: CABG x3  GI: abnormal KUB, with ileus. On Reglan, narcotics reduced. No BM charted. No prealbumin yet this admission. On docusate, simethicone, Dulcolax. PO PPI  Endo: hx DM, A1C 6.8 this admission. CBGs 90-276  Lytes: K 4.1- MD ordered 2 KCl runs, Mag from 6/14 2.4, no phos this admission, CorCa ~9.3  Renal: SCr 0.7, CrCl >173m/min- stable. LR @ 20  Pulm: extubated post-op, now 99/4L [place]  Cards: BP soft, HR slightly tachy- continued to require dopamine. On ASA, fenofibrate, IV Lasix '20mg'$  BID, Lopressor and rosuvastatin.  Hepatobil: LFTs WNL, albumin low at 2.8  Neuro: no acute issues  ID: Post-op antibiotics completed. WBC normal, afeb.  Best Practices: Lovenox  TPN Access:  R IJ; PICC placement ordered TPN start date: 6/16>>  Current Nutrition:  NPO Was previously on full liquid with ~50% consumption  Nutritional Goals:  2100 kCal, 100 grams of protein per day based on population estimates  Plan:  -start Clinimix E 5/15 at 456mhr + IV lipids at 102mr- this provides 48g protein and 1161kcal in a 24h period -MVI and trace elements to TPN -change MIVF to NS @ 20 to avoid extra electrolytes from LF -continue SSI and Levemir as ordered- anticipate patient will need insulin added to TPN as CBGs have been high. Hesitant to add to tonight's bag at this morning's CBGs were lower -full TPN labs in the morning as per protocol -follow for need to change medications to IV -follow RD recommendations and will update goals accordingly   Marceil Welp D. Wess Baney, PharmD, BCPS Clinical Pharmacist Pager: [phone] 9:07 AM

## 2016-01-05 NOTE — Progress Notes (Signed)
Initial Nutrition Assessment  DOCUMENTATION CODES:   Obesity unspecified  INTERVENTION:  TPN per pharmacy.    NUTRITION DIAGNOSIS:   Inadequate oral intake related to altered GI function (Ileus) as evidenced by NPO status.  GOAL:   Patient will meet greater than or equal to 90% of their needs  MONITOR:   Labs, Weight trends, Diet advancement, Skin, I & O's  REASON FOR ASSESSMENT:   Consult New TPN/TNA  ASSESSMENT:   Pt s/p CABG now with Ileus with hi pitched bowel sounds. On TPN.    Pt reports normal intake PTA (6/13).  PO's 80-100% on 6/14. Pt reports eating tomato soup, milk, and jello and denies N/V or abdominal pain associated with intake.  Spoke to [employer]. Potential abdomen distention and per MD note hi pitched bowel sounds.   Discussed TPN with pharmacy. Plan:  Clinimix E 5/15 at 7m/hr + IV lipids at 140mhr starting at 1800 today.  TPN regimen provides 48g protein and 1161kcal in a 24h period.  NFPE reveals no muscle or fat depletion, no edema. Weight is stable.   Labs reviewed; CBGs 90-276, Ca 8.6, Alk phos WNL. Meds reviewed; lasix, KCl.  Diet Order:  Diet NPO time specified Except for: Sips with Meds TPN (CLINIMIX-E) Adult  Skin:  Reviewed, no issues  Last BM:  unknown  Height:   Ht Readings from Last 1 Encounters:  01/02/16 5' 11"  (1.803 m)    Weight:   Wt Readings from Last 1 Encounters:  01/05/16 229 lb 4.5 oz (104 kg)    Ideal Body Weight:  78.2 kg  BMI:  Body mass index is 31.99 kg/(m^2).  Estimated Nutritional Needs:   Kcal:  2000-2200  Protein:  105-120 gm  Fluid:  2-2.2 L  EDUCATION NEEDS:   No education needs identified at this time  MeGeoffery LyonsMSShallotteietetic Intern Pager [phone] Chart reviewed. I agree with student dietitian note.  KiMolli BarrowsRD, LDN, CNHuerfanoager# [phone] Hours Pager# [phone]

## 2016-01-05 NOTE — Progress Notes (Signed)
Peripherally Inserted Central Catheter/Midline Placement  The IV Nurse has discussed with the patient and/or persons authorized to consent for the patient, the purpose of this procedure and the potential benefits and risks involved with this procedure.  The benefits include less needle sticks, lab draws from the catheter and patient may be discharged home with the catheter.  Risks include, but not limited to, infection, bleeding, blood clot (thrombus formation), and puncture of an artery; nerve damage and irregular heat beat.  Alternatives to this procedure were also discussed.  PICC/Midline Placement Documentation        Jorge Adams, [name] 01/05/2016, 10:05 AM

## 2016-01-05 NOTE — Significant Event (Addendum)
Patient went into A Fib with HR 110-125; patient is asymptomatic, denied pain, SOB, or dizziness. Spoke with Dr. Cyndia Bent and received orders for amiodarone bolus and drip.

## 2016-01-06 ENCOUNTER — Inpatient Hospital Stay (HOSPITAL_COMMUNITY): Payer: 59

## 2016-01-06 LAB — GLUCOSE, CAPILLARY
Glucose-Capillary: 181 mg/dL — ABNORMAL HIGH (ref 65–99)
Glucose-Capillary: 155 mg/dL — ABNORMAL HIGH (ref 65–99)
Glucose-Capillary: 168 mg/dL — ABNORMAL HIGH (ref 65–99)
Glucose-Capillary: 177 mg/dL — ABNORMAL HIGH (ref 65–99)
Glucose-Capillary: 131 mg/dL — ABNORMAL HIGH (ref 65–99)
Glucose-Capillary: 184 mg/dL — ABNORMAL HIGH (ref 65–99)

## 2016-01-06 LAB — COMPREHENSIVE METABOLIC PANEL
ALT: 17 U/L (ref 17–63)
AST: 18 U/L (ref 15–41)
Albumin: 2.6 g/dL — ABNORMAL LOW (ref 3.5–5.0)
Alkaline Phosphatase: 57 U/L (ref 38–126)
Anion gap: 7 (ref 5–15)
BUN: 14 mg/dL (ref 6–20)
CO2: 30 mmol/L (ref 22–32)
Calcium: 8.7 mg/dL — ABNORMAL LOW (ref 8.9–10.3)
Chloride: 99 mmol/L — ABNORMAL LOW (ref 101–111)
Creatinine, Ser: 0.72 mg/dL (ref 0.61–1.24)
GFR calc Af Amer: >60 mL/min (ref >60)
GFR calc non Af Amer: >60 mL/min (ref >60)
Glucose, Bld: 161 mg/dL — ABNORMAL HIGH (ref 65–99)
Potassium: 3.8 mmol/L (ref 3.5–5.1)
Sodium: 136 mmol/L (ref 135–145)
Total Bilirubin: 0.4 mg/dL (ref 0.3–1.2)
Total Protein: 5.7 g/dL — ABNORMAL LOW (ref 6.5–8.1)

## 2016-01-06 LAB — DIFFERENTIAL
Basophils Absolute: 0 10*3/uL (ref 0.0–0.1)
Basophils Relative: 1 %
Eosinophils Absolute: 0.2 10*3/uL (ref 0.0–0.7)
Eosinophils Relative: 4 %
Lymphocytes Relative: 18 %
Lymphs Abs: 1.2 10*3/uL (ref 0.7–4.0)
Monocytes Absolute: 0.7 10*3/uL (ref 0.1–1.0)
Monocytes Relative: 10 %
Neutro Abs: 4.5 10*3/uL (ref 1.7–7.7)
Neutrophils Relative %: 67 %

## 2016-01-06 LAB — PHOSPHORUS: Phosphorus: 2.7 mg/dL (ref 2.5–4.6)

## 2016-01-06 LAB — TRIGLYCERIDES: Triglycerides: 230 mg/dL — ABNORMAL HIGH (ref <150)

## 2016-01-06 LAB — CBC
HCT: 29.4 % — ABNORMAL LOW (ref 39.0–52.0)
Hemoglobin: 9.4 g/dL — ABNORMAL LOW (ref 13.0–17.0)
MCH: 27.2 pg (ref 26.0–34.0)
MCHC: 32 g/dL (ref 30.0–36.0)
MCV: 85.2 fL (ref 78.0–100.0)
Platelets: 162 10*3/uL (ref 150–400)
RBC: 3.45 MIL/uL — ABNORMAL LOW (ref 4.22–5.81)
RDW: 14.9 % (ref 11.5–15.5)
WBC: 6.6 10*3/uL (ref 4.0–10.5)

## 2016-01-06 LAB — MAGNESIUM: Magnesium: 2 mg/dL (ref 1.7–2.4)

## 2016-01-06 LAB — PREALBUMIN: Prealbumin: 9.4 mg/dL — ABNORMAL LOW (ref 18–38)

## 2016-01-06 MED ORDER — POTASSIUM CHLORIDE 10 MEQ/50ML IV SOLN
10.0000 meq | INTRAVENOUS | Status: AC
  Administered 2016-01-06 (×3): 10 meq via INTRAVENOUS
  Filled 2016-01-06 (×3): qty 50

## 2016-01-06 MED ORDER — FAT EMULSION 20 % IV EMUL
240.0000 mL | INTRAVENOUS | Status: AC
  Administered 2016-01-06: 240 mL via INTRAVENOUS
  Filled 2016-01-06: qty 250

## 2016-01-06 MED ORDER — TRACE MINERALS CR-CU-MN-SE-ZN 10-1000-500-60 MCG/ML IV SOLN
INTRAVENOUS | Status: AC
  Administered 2016-01-06: 18:00:00 via INTRAVENOUS
  Filled 2016-01-06: qty 2280

## 2016-01-06 NOTE — Progress Notes (Signed)
Patient ID: Jorge Adams, male   DOB: September 13, 1950, 65 y.o.   MRN: [id number]  SICU Evening Rounds:  Hemodynamically stable   Back in sinus on amio  Still passing flatus TNA going  Ambulating well

## 2016-01-06 NOTE — Progress Notes (Signed)
PARENTERAL NUTRITION CONSULT NOTE  Pharmacy Consult for TPN Indication: ileus  No Known Allergies  Patient Measurements: Height: '5\' 11"'$  (180.3 cm) Weight: 225 lb 8.5 oz (102.3 kg) IBW/kg (Calculated) : 75.3 Adjusted Body Weight:   84kg  Vital Signs: Temp: 98.1 F (36.7 C) (06/17 0812) Temp Source: Oral (06/17 0812) BP: 97/62 mmHg (06/17 0600) Pulse Rate: 101 (06/17 0600) Intake/Output from previous day: 06/16 0701 - 06/17 0700 In: 5867.6 [I.V.:5167.6; IV Piggyback:50; TPN:650] Out: 3240 [Urine:3240] Intake/Output from this shift:  Labs:  Recent Labs  01/04/16 0514  01/05/16 0410 01/05/16 1654 01/06/16 0430  WBC 12.0*  --  8.6  --  6.6  HGB 10.1*  < > 9.4* 9.9* 9.4*  HCT 31.9*  < > 29.7* 29.0* 29.4*  PLT 144*  --  124*  --  162  < > = values in this interval not displayed.  Recent Labs  01/03/16 1720 01/04/16 0514  01/05/16 0410 01/05/16 1654 01/06/16 0430  NA  --  137  < > 137 138 136  K  --  4.2  < > 4.1 4.1 3.8  CL  --  104  < > 103 96* 99*  CO2  --  28  --  30  --  30  GLUCOSE  --  128*  < > 102* 94 161*  BUN  --  11  < > '11 13 14  '$ CREATININE 0.91 0.80  < > 0.70 0.70 0.72  CALCIUM  --  8.5*  --  8.6*  --  8.7*  MG 2.4  --   --   --   --  2.0  PHOS  --   --   --   --   --  2.7  PROT  --   --   --  5.6*  --  5.7*  ALBUMIN  --   --   --  2.8*  --  2.6*  AST  --   --   --  23  --  18  ALT  --   --   --  19  --  17  ALKPHOS  --   --   --  52  --  57  BILITOT  --   --   --  0.8  --  0.4  PREALBUMIN  --   --   --   --   --  9.4*  TRIG  --   --   --   --   --  230*  < > = values in this interval not displayed. Estimated Creatinine Clearance: 110.6 mL/min (by C-G formula based on Cr of 0.72).   Recent Labs  01/05/16 2011 01/05/16 2336 01/06/16 0427  GLUCAP 154* 181* 155*   Insulin Requirements in the past 24 hours:  Levemir 20units BID + 12 units SSI  Admit: 33 YOM diagnosed with severe 3 vessel disease and was admitted for CABG. Post-op has  developed and ileus.   Surgeries/Procedures: 6/13: CABG x3  GI: abnormal KUB, with ileus. On Reglan, narcotics reduced. No BM charted. No prealbumin yet this admission. On docusate, simethicone, Dulcolax. PO PPI.  Triglycerides slightly elevated 230 - will watch closely with IV Lipids on board.  Endo: hx DM, A1C 6.8 this admission. CBGs 90-220 - mostly 150's.  Lytes: K 3.8 received 2 KCl runs 6/16, Mag 2.0, phos 2.7, CorCa ~9.3  Renal: SCr 0.7, CrCl >176m/min- stable. Good UOP (1.281mkg/hr) Net positive 2L yesterday  Pulm: extubated post-op, now 90/4L  [place]  Cards: BP soft, HR slightly tachy- developed Afib - now on Amiodarone - continued to require dopamine. On ASA, fenofibrate, IV Lasix '20mg'$  BID, Lopressor and rosuvastatin.  Hepatobil: LFTs WNL, albumin low at 2.8, Prealbumin 9.4  Neuro: no acute issues  ID: Post-op antibiotics completed. WBC normal, afeb.  Best Practices: Lovenox  TPN Access:  R IJ; PICC placement ordered TPN start date: 6/16>>  Current Nutrition:  NPO Was previously on full liquid with ~50% consumption Clinimix E 5/15 @ 2m/hr  Nutritional Goals:  2100 kCal, 100 grams of protein per day based on population estimates 2000-2200 and 105-120 gm protein per RD recommendations  Plan:  -Increase Clinimix E 5/15 at 965mhr + IV lipids at 1024mr- this provides 114 protein and 2233 kcal in a 24h period -MVI and trace elements to TPN -change MIVF to NS @ 20 to avoid extra electrolytes from LF -continue SSI and Levemir as ordered  -full TPN labs in the morning as per protocol -follow for need to change medications to IV PremontharmD., MS Clinical Pharmacist Pager:  [phone] you for allowing pharmacy to be part of this patients care team. 01/06/2016 8:13 AM

## 2016-01-06 NOTE — Progress Notes (Signed)
4 Days Post-Op Procedure(s) (LRB): CORONARY ARTERY BYPASS GRAFTING (CABG) TIMES 3 USING LEFT INTERNAL MAMMARY ARTERY AND RIGHT SAPHENOUS LEG VEIN HARVESTED ENDOSCOPICALLY (N/A) TRANSESOPHAGEAL ECHOCARDIOGRAM (TEE) (N/A) Subjective:  Feels ok, belly feels tight. Passing some flatus. No abdominal pain or nausea  Objective: Vital signs in last 24 hours: Temp:  [98.1 F (36.7 C)-98.5 F (36.9 C)] 98.1 F (36.7 C) (06/17 0812) Pulse Rate:  [75-105] 98 (06/17 1003) Cardiac Rhythm:  [-] Atrial fibrillation (06/17 0500) Resp:  [11-26] 15 (06/17 1000) BP: (80-118)/(47-72) 114/61 mmHg (06/17 1003) SpO2:  [90 %-100 %] 98 % (06/17 0800) Weight:  [102.3 kg (225 lb 8.5 oz)] 102.3 kg (225 lb 8.5 oz) (06/17 0600)  Hemodynamic parameters for last 24 hours:    Intake/Output from previous day: 06/16 0701 - 06/17 0700 In: 5867.6 [I.V.:5167.6; IV Piggyback:50; TPN:650] Out: 3240 [Urine:3240] Intake/Output this shift: Total I/O In: -  Out: 300 [Urine:300]  General appearance: alert and cooperative Neurologic: intact Heart: regular rate and rhythm, S1, S2 normal, no murmur, click, rub or gallop Lungs: clear to auscultation bilaterally Abdomen: active bowel sounds, soft, distended, non-tender Extremities: edema mild Wound: incision ok  Lab Results:  Recent Labs  01/05/16 0410 01/05/16 1654 01/06/16 0430  WBC 8.6  --  6.6  HGB 9.4* 9.9* 9.4*  HCT 29.7* 29.0* 29.4*  PLT 124*  --  162   BMET:  Recent Labs  01/05/16 0410 01/05/16 1654 01/06/16 0430  NA 137 138 136  K 4.1 4.1 3.8  CL 103 96* 99*  CO2 30  --  30  GLUCOSE 102* 94 161*  BUN '11 13 14  '$ CREATININE 0.70 0.70 0.72  CALCIUM 8.6*  --  8.7*    PT/INR: No results for input(s): LABPROT, INR in the last 72 hours. ABG    Component Value Date/Time   PHART 7.326* 01/02/2016 2059   HCO3 21.8 01/02/2016 2059   TCO2 29 01/05/2016 1654   ACIDBASEDEF 4.0* 01/02/2016 2059   O2SAT 89.0 01/02/2016 2059   CBG (last 3)    Recent Labs  01/05/16 2336 01/06/16 0427 01/06/16 0807  GLUCAP 181* 155* 168*    Assessment/Plan: S/P Procedure(s) (LRB): CORONARY ARTERY BYPASS GRAFTING (CABG) TIMES 3 USING LEFT INTERNAL MAMMARY ARTERY AND RIGHT SAPHENOUS LEG VEIN HARVESTED ENDOSCOPICALLY (N/A) TRANSESOPHAGEAL ECHOCARDIOGRAM (TEE) (N/A)  He is hemodynamically stable.  Postop atrial fib converted this am on amiodarone. Continue IV.  Postop ileus: improving with active bowel sounds and flatus.  Continue NPO, TNA  Mobilize and IS  DM:  Glucose under control on Levemir and SSI.   LOS: 4 days    Jorge Adams 01/06/2016

## 2016-01-07 LAB — COMPREHENSIVE METABOLIC PANEL
ALT: 16 U/L — ABNORMAL LOW (ref 17–63)
AST: 15 U/L (ref 15–41)
Albumin: 2.4 g/dL — ABNORMAL LOW (ref 3.5–5.0)
Alkaline Phosphatase: 56 U/L (ref 38–126)
Anion gap: 9 (ref 5–15)
BUN: 13 mg/dL (ref 6–20)
CO2: 28 mmol/L (ref 22–32)
Calcium: 8.9 mg/dL (ref 8.9–10.3)
Chloride: 99 mmol/L — ABNORMAL LOW (ref 101–111)
Creatinine, Ser: 0.68 mg/dL (ref 0.61–1.24)
GFR calc Af Amer: >60 mL/min (ref >60)
GFR calc non Af Amer: >60 mL/min (ref >60)
Glucose, Bld: 194 mg/dL — ABNORMAL HIGH (ref 65–99)
Potassium: 3.7 mmol/L (ref 3.5–5.1)
Sodium: 136 mmol/L (ref 135–145)
Total Bilirubin: 0.5 mg/dL (ref 0.3–1.2)
Total Protein: 5.6 g/dL — ABNORMAL LOW (ref 6.5–8.1)

## 2016-01-07 LAB — GLUCOSE, CAPILLARY
Glucose-Capillary: 169 mg/dL — ABNORMAL HIGH (ref 65–99)
Glucose-Capillary: 186 mg/dL — ABNORMAL HIGH (ref 65–99)
Glucose-Capillary: 200 mg/dL — ABNORMAL HIGH (ref 65–99)
Glucose-Capillary: 201 mg/dL — ABNORMAL HIGH (ref 65–99)
Glucose-Capillary: 243 mg/dL — ABNORMAL HIGH (ref 65–99)

## 2016-01-07 LAB — CBC
HCT: 28.7 % — ABNORMAL LOW (ref 39.0–52.0)
Hemoglobin: 9.1 g/dL — ABNORMAL LOW (ref 13.0–17.0)
MCH: 26.8 pg (ref 26.0–34.0)
MCHC: 31.7 g/dL (ref 30.0–36.0)
MCV: 84.7 fL (ref 78.0–100.0)
Platelets: 173 10*3/uL (ref 150–400)
RBC: 3.39 MIL/uL — ABNORMAL LOW (ref 4.22–5.81)
RDW: 14.7 % (ref 11.5–15.5)
WBC: 5.3 10*3/uL (ref 4.0–10.5)

## 2016-01-07 MED ORDER — TRACE MINERALS CR-CU-MN-SE-ZN 10-1000-500-60 MCG/ML IV SOLN
INTRAVENOUS | Status: AC
  Administered 2016-01-07: 18:00:00 via INTRAVENOUS
  Filled 2016-01-07: qty 2280

## 2016-01-07 MED ORDER — INSULIN DETEMIR 100 UNIT/ML ~~LOC~~ SOLN
25.0000 [IU] | Freq: Two times a day (BID) | SUBCUTANEOUS | Status: DC
  Administered 2016-01-07 – 2016-01-08 (×3): 25 [IU] via SUBCUTANEOUS
  Filled 2016-01-07 (×5): qty 0.25

## 2016-01-07 MED ORDER — POTASSIUM CHLORIDE 10 MEQ/50ML IV SOLN
10.0000 meq | INTRAVENOUS | Status: AC
  Administered 2016-01-07 (×3): 10 meq via INTRAVENOUS
  Filled 2016-01-07 (×2): qty 50

## 2016-01-07 MED ORDER — FAT EMULSION 20 % IV EMUL
240.0000 mL | INTRAVENOUS | Status: AC
  Administered 2016-01-07: 240 mL via INTRAVENOUS
  Filled 2016-01-07: qty 250

## 2016-01-07 NOTE — Progress Notes (Signed)
5 Days Post-Op Procedure(s) (LRB): CORONARY ARTERY BYPASS GRAFTING (CABG) TIMES 3 USING LEFT INTERNAL MAMMARY ARTERY AND RIGHT SAPHENOUS LEG VEIN HARVESTED ENDOSCOPICALLY (N/A) TRANSESOPHAGEAL ECHOCARDIOGRAM (TEE) (N/A) Subjective:  Passing flatus, No BM yet  Objective: Vital signs in last 24 hours: Temp:  [98 F (36.7 C)-98.5 F (36.9 C)] 98 F (36.7 C) (06/18 1327) Pulse Rate:  [74-89] 77 (06/18 1300) Cardiac Rhythm:  [-] Normal sinus rhythm (06/18 1200) Resp:  [13-23] 21 (06/18 1300) BP: (100-139)/(54-98) 115/98 mmHg (06/18 1300) SpO2:  [93 %-99 %] 96 % (06/18 1300) Weight:  [101.4 kg (223 lb 8.7 oz)] 101.4 kg (223 lb 8.7 oz) (06/18 0600)  Hemodynamic parameters for last 24 hours:    Intake/Output from previous day: 06/17 0701 - 06/18 0700 In: 10487.4 [I.V.:8372.4; IV Piggyback:200; SEL:9532] Out: 2940 [Urine:2940] Intake/Output this shift: Total I/O In: 950.2 [I.V.:220.2; IV Piggyback:100; TPN:630] Out: 1480 [Urine:1480]  General appearance: alert and cooperative Heart: regular rate and rhythm, S1, S2 normal, no murmur, click, rub or gallop Lungs: clear to auscultation bilaterally Abdomen: active bowel sounds, soft, nontender, mildly distended according to him and wife.  Extremities: edema mild Wound: incision ok  Lab Results:  Recent Labs  01/06/16 0430 01/07/16 0419  WBC 6.6 5.3  HGB 9.4* 9.1*  HCT 29.4* 28.7*  PLT 162 173   BMET:  Recent Labs  01/06/16 0430 01/07/16 0419  NA 136 136  K 3.8 3.7  CL 99* 99*  CO2 30 28  GLUCOSE 161* 194*  BUN 14 13  CREATININE 0.72 0.68  CALCIUM 8.7* 8.9    PT/INR: No results for input(s): LABPROT, INR in the last 72 hours. ABG    Component Value Date/Time   PHART 7.326* 01/02/2016 2059   HCO3 21.8 01/02/2016 2059   TCO2 29 01/05/2016 1654   ACIDBASEDEF 4.0* 01/02/2016 2059   O2SAT 89.0 01/02/2016 2059   CBG (last 3)   Recent Labs  01/06/16 2354 01/07/16 0801 01/07/16 1325  GLUCAP 169* 186* 201*     Assessment/Plan: S/P Procedure(s) (LRB): CORONARY ARTERY BYPASS GRAFTING (CABG) TIMES 3 USING LEFT INTERNAL MAMMARY ARTERY AND RIGHT SAPHENOUS LEG VEIN HARVESTED ENDOSCOPICALLY (N/A) TRANSESOPHAGEAL ECHOCARDIOGRAM (TEE) (N/A)  Hemodynamically stable in sinus rhythm  Postop atrial fib on IV amio  Postop ileus: He has active BS and passing a lot of flatus so will start clear liquids. Continue TNA. Check KUB in the am.  Mobilize and IS.  DM: continue Levemir and SSI. His glucose has been close to 200 so will increase Levemir.   LOS: 5 days    Jorge Adams 01/07/2016

## 2016-01-07 NOTE — Progress Notes (Signed)
PARENTERAL NUTRITION CONSULT NOTE  Pharmacy Consult for TPN Indication: ileus  No Known Allergies  Patient Measurements: Height: '5\' 11"'$  (180.3 cm) Weight: 223 lb 8.7 oz (101.4 kg) IBW/kg (Calculated) : 75.3 Adjusted Body Weight:   84kg  Vital Signs: Temp: 98.5 F (36.9 C) (06/18 0805) Temp Source: Oral (06/18 0805) BP: 127/69 mmHg (06/18 0700) Pulse Rate: 89 (06/18 0700) Intake/Output from previous day: 06/17 0701 - 06/18 0700 In: 10487.4 [I.V.:8372.4; IV Piggyback:200; GMW:1027] Out: 2940 [Urine:2940] Intake/Output from this shift:  Labs:  Recent Labs  01/05/16 0410 01/05/16 1654 01/06/16 0430 01/07/16 0419  WBC 8.6  --  6.6 5.3  HGB 9.4* 9.9* 9.4* 9.1*  HCT 29.7* 29.0* 29.4* 28.7*  PLT 124*  --  162 173    Recent Labs  01/05/16 0410 01/05/16 1654 01/06/16 0430 01/07/16 0419  NA 137 138 136 136  K 4.1 4.1 3.8 3.7  CL 103 96* 99* 99*  CO2 30  --  30 28  GLUCOSE 102* 94 161* 194*  BUN '11 13 14 13  '$ CREATININE 0.70 0.70 0.72 0.68  CALCIUM 8.6*  --  8.7* 8.9  MG  --   --  2.0  --   PHOS  --   --  2.7  --   PROT 5.6*  --  5.7* 5.6*  ALBUMIN 2.8*  --  2.6* 2.4*  AST 23  --  18 15  ALT 19  --  17 16*  ALKPHOS 52  --  57 56  BILITOT 0.8  --  0.4 0.5  PREALBUMIN  --   --  9.4*  --   TRIG  --   --  230*  --    Estimated Creatinine Clearance: 110.1 mL/min (by C-G formula based on Cr of 0.68).   Recent Labs  01/06/16 1634 01/06/16 2010 01/06/16 2354  GLUCAP 131* 184* 169*   Insulin Requirements in the past 24 hours:  Levemir 20units BID + 22 units SSI  Admit: 57 YOM diagnosed with severe 3 vessel disease and was admitted for CABG. Post-op has developed and ileus.   Surgeries/Procedures: 6/13: CABG x3  GI: abnormal KUB, with ileus. On Reglan, narcotics reduced. No BM charted. No prealbumin yet this admission. On docusate, simethicone, Dulcolax. PO PPI.  Triglycerides slightly elevated 230 - will watch closely with IV Lipids on board.  Endo: hx DM,  A1C 6.8 this admission. CBGs 169-184.  Lytes: K low - stable - will replace to > 4.0,  Last Mag 2.0, last phos 2.7, CorCa ~9.3  Renal: SCr 0.7, CrCl >137m/min- stable. Good UOP (1.275mkg/hr) Net positive 7L yesterday  Pulm: extubated post-op, now 95 RA  Cards: BP soft, HR slightly tachy- developed Afib - now on Amiodarone - off Dopamine. On ASA, fenofibrate, IV Lasix '20mg'$  BID, Lopressor and rosuvastatin.  Hepatobil: LFTs WNL, albumin low at 2.8, Prealbumin 9.4  Neuro: no acute issues  ID: Post-op antibiotics completed. WBC normal, afeb.  Best Practices: Lovenox  TPN Access:  R IJ; PICC placement ordered TPN start date: 6/16>>  Current Nutrition:  NPO Was previously on full liquid with ~50% consumption Clinimix E 5/15 @ 4058mr  Nutritional Goals:  2100 kCal, 100 grams of protein per day based on population estimates 2000-2200 and 105-120 gm protein per RD recommendations  Plan:  -Clinimix E 5/15 at 82m68m + IV lipids at 10mL36m this provides 114 protein and 2233 kcal in a 24h period -MVI and trace elements to TPN -change MIVF to NS @ 20  to avoid extra electrolytes from LF -continue SSI and Levemir as ordered - may need to add a bit to TPN if continued trend up -full TPN labs in the morning as per protocol -follow for need to change medications to [place], PharmD., MS Clinical Pharmacist Pager:  [phone] Thank you for allowing pharmacy to be part of this patients care team. 01/07/2016 8:09 AM

## 2016-01-07 NOTE — Progress Notes (Signed)
Patient ID: [name], male   DOB: [birth year], 65 y.o.   MRN: [id number]  SICU Evening Rounds  Hemodynamically stable in sinus rhythm on amio.  Had a small BM this afternoon Feels better. Ambulated 3 times.

## 2016-01-08 ENCOUNTER — Inpatient Hospital Stay (HOSPITAL_COMMUNITY): Payer: 59

## 2016-01-08 LAB — TRIGLYCERIDES: Triglycerides: 185 mg/dL — ABNORMAL HIGH (ref <150)

## 2016-01-08 LAB — DIFFERENTIAL
Basophils Absolute: 0 10*3/uL (ref 0.0–0.1)
Basophils Relative: 0 %
Eosinophils Absolute: 0.3 10*3/uL (ref 0.0–0.7)
Eosinophils Relative: 5 %
Lymphocytes Relative: 19 %
Lymphs Abs: 1 10*3/uL (ref 0.7–4.0)
Monocytes Absolute: 0.6 10*3/uL (ref 0.1–1.0)
Monocytes Relative: 12 %
Neutro Abs: 3.5 10*3/uL (ref 1.7–7.7)
Neutrophils Relative %: 64 %

## 2016-01-08 LAB — COMPREHENSIVE METABOLIC PANEL
ALT: 17 U/L (ref 17–63)
AST: 16 U/L (ref 15–41)
Albumin: 2.7 g/dL — ABNORMAL LOW (ref 3.5–5.0)
Alkaline Phosphatase: 58 U/L (ref 38–126)
Anion gap: 8 (ref 5–15)
BUN: 13 mg/dL (ref 6–20)
CO2: 24 mmol/L (ref 22–32)
Calcium: 8.9 mg/dL (ref 8.9–10.3)
Chloride: 103 mmol/L (ref 101–111)
Creatinine, Ser: 0.66 mg/dL (ref 0.61–1.24)
GFR calc Af Amer: >60 mL/min (ref >60)
GFR calc non Af Amer: >60 mL/min (ref >60)
Glucose, Bld: 191 mg/dL — ABNORMAL HIGH (ref 65–99)
Potassium: 3.9 mmol/L (ref 3.5–5.1)
Sodium: 135 mmol/L (ref 135–145)
Total Bilirubin: 0.6 mg/dL (ref 0.3–1.2)
Total Protein: 6 g/dL — ABNORMAL LOW (ref 6.5–8.1)

## 2016-01-08 LAB — CBC
HCT: 29.6 % — ABNORMAL LOW (ref 39.0–52.0)
Hemoglobin: 9.4 g/dL — ABNORMAL LOW (ref 13.0–17.0)
MCH: 26.8 pg (ref 26.0–34.0)
MCHC: 31.8 g/dL (ref 30.0–36.0)
MCV: 84.3 fL (ref 78.0–100.0)
Platelets: 195 10*3/uL (ref 150–400)
RBC: 3.51 MIL/uL — ABNORMAL LOW (ref 4.22–5.81)
RDW: 14.9 % (ref 11.5–15.5)
WBC: 5.4 10*3/uL (ref 4.0–10.5)

## 2016-01-08 LAB — MAGNESIUM: Magnesium: 1.6 mg/dL — ABNORMAL LOW (ref 1.7–2.4)

## 2016-01-08 LAB — GLUCOSE, CAPILLARY
Glucose-Capillary: 176 mg/dL — ABNORMAL HIGH (ref 65–99)
Glucose-Capillary: 187 mg/dL — ABNORMAL HIGH (ref 65–99)
Glucose-Capillary: 207 mg/dL — ABNORMAL HIGH (ref 65–99)
Glucose-Capillary: 210 mg/dL — ABNORMAL HIGH (ref 65–99)
Glucose-Capillary: 212 mg/dL — ABNORMAL HIGH (ref 65–99)
Glucose-Capillary: 286 mg/dL — ABNORMAL HIGH (ref 65–99)

## 2016-01-08 LAB — PREALBUMIN: Prealbumin: 17.2 mg/dL — ABNORMAL LOW (ref 18–38)

## 2016-01-08 LAB — PHOSPHORUS: Phosphorus: 3.3 mg/dL (ref 2.5–4.6)

## 2016-01-08 MED ORDER — INSULIN ASPART 100 UNIT/ML ~~LOC~~ SOLN: 0.0000 [IU] | Freq: Every day | SUBCUTANEOUS | Status: DC

## 2016-01-08 MED ORDER — INSULIN ASPART 100 UNIT/ML ~~LOC~~ SOLN
0.0000 [IU] | Freq: Three times a day (TID) | SUBCUTANEOUS | Status: DC
  Administered 2016-01-08: 11 [IU] via SUBCUTANEOUS
  Administered 2016-01-08: 4 [IU] via SUBCUTANEOUS
  Administered 2016-01-08: 7 [IU] via SUBCUTANEOUS
  Administered 2016-01-09: 4 [IU] via SUBCUTANEOUS
  Administered 2016-01-09: 7 [IU] via SUBCUTANEOUS
  Administered 2016-01-09: 11 [IU] via SUBCUTANEOUS
  Administered 2016-01-10: 7 [IU] via SUBCUTANEOUS
  Administered 2016-01-10 (×2): 3 [IU] via SUBCUTANEOUS

## 2016-01-08 MED ORDER — POTASSIUM CHLORIDE 10 MEQ/50ML IV SOLN
10.0000 meq | INTRAVENOUS | Status: AC
  Administered 2016-01-08 (×2): 10 meq via INTRAVENOUS
  Filled 2016-01-08: qty 50

## 2016-01-08 MED ORDER — TRACE MINERALS CR-CU-MN-SE-ZN 10-1000-500-60 MCG/ML IV SOLN
INTRAVENOUS | Status: DC
  Administered 2016-01-08: 17:00:00 via INTRAVENOUS
  Filled 2016-01-08: qty 2280

## 2016-01-08 MED ORDER — AMIODARONE HCL 200 MG PO TABS
400.0000 mg | ORAL_TABLET | Freq: Two times a day (BID) | ORAL | Status: DC
  Administered 2016-01-08 – 2016-01-10 (×6): 400 mg via ORAL
  Filled 2016-01-08 (×6): qty 2

## 2016-01-08 MED ORDER — FAT EMULSION 20 % IV EMUL
240.0000 mL | INTRAVENOUS | Status: DC
  Administered 2016-01-08: 240 mL via INTRAVENOUS
  Filled 2016-01-08: qty 250

## 2016-01-08 MED ORDER — MAGNESIUM SULFATE 2 GM/50ML IV SOLN
2.0000 g | Freq: Once | INTRAVENOUS | Status: AC
  Administered 2016-01-08: 2 g via INTRAVENOUS
  Filled 2016-01-08: qty 50

## 2016-01-08 NOTE — Progress Notes (Signed)
CARDIAC REHAB PHASE I   PRE:  Rate/Rhythm: 83 SR    BP: sitting 119/78    SaO2: 96 RA  MODE:  Ambulation: 640 ft   POST:  Rate/Rhythm: 96 SR    BP: sitting 160/87     SaO2: 96 RA  Pt very motivated, this was third walk today. Used RW, min assist. Rest x1 at halfway, c/o fatigue toward end. To recliner, will f/u tomorrow. [place], ACSM 01/08/2016 2:02 PM

## 2016-01-08 NOTE — Progress Notes (Signed)
PARENTERAL NUTRITION CONSULT NOTE  Pharmacy Consult for TPN Indication: ileus  No Known Allergies  Patient Measurements: Height: '5\' 11"'$  (180.3 cm) Weight: 221 lb 1.9 oz (100.3 kg) IBW/kg (Calculated) : 75.3 Adjusted Body Weight:   84kg  Vital Signs: Temp: 98 F (36.7 C) (06/19 0800) Temp Source: Oral (06/19 0800) BP: 137/75 mmHg (06/19 0800) Pulse Rate: 92 (06/19 0800) Intake/Output from previous day: 06/18 0701 - 06/19 0700 In: 3275.6 [I.V.:760.6; IV Piggyback:100; TPN:2415] Out: 4855 [Urine:4855] Intake/Output from this shift:  Labs:  Recent Labs  01/06/16 0430 01/07/16 0419 01/08/16 0414  WBC 6.6 5.3 5.4  HGB 9.4* 9.1* 9.4*  HCT 29.4* 28.7* 29.6*  PLT 162 173 195    Recent Labs  01/06/16 0430 01/07/16 0419 01/08/16 0414  NA 136 136 135  K 3.8 3.7 3.9  CL 99* 99* 103  CO2 '30 28 24  '$ GLUCOSE 161* 194* 191*  BUN '14 13 13  '$ CREATININE 0.72 0.68 0.66  CALCIUM 8.7* 8.9 8.9  MG 2.0  --  1.6*  PHOS 2.7  --  3.3  PROT 5.7* 5.6* 6.0*  ALBUMIN 2.6* 2.4* 2.7*  AST '18 15 16  '$ ALT 17 16* 17  ALKPHOS 57 56 58  BILITOT 0.4 0.5 0.6  PREALBUMIN 9.4*  --  17.2*  TRIG 230*  --  185*   Estimated Creatinine Clearance: 109.6 mL/min (by C-G formula based on Cr of 0.66).   Recent Labs  01/07/16 1952 01/07/16 2344 01/08/16 0405  GLUCAP 243* 210* 212*   Insulin Requirements in the past 24 hours:  Levemir 25units BID + 40 units SSI  Admit: 5 YOM diagnosed with severe 3 vessel disease and was admitted for CABG. Post-op has developed and ileus.   Surgeries/Procedures: 6/13: CABG x3  GI: abnormal KUB, with ileus. On Reglan, narcotics reduced. BM 6/18, ileus better. On docusate, simethicone, Dulcolax. PO PPI. Prealbumin 9.4>17.2. To transfer to stepdown 6/19, started on full liquids (keep TPN today per CVTS)  Endo: hx DM, A1C 6.8 this admission. CBGs [id number]. Levemir incr to 25 bid + SSI-resistant tid w/ meals+bedtime (PVT changed from q4h?)  Lytes: K up 3.9 (s/p 3  k runs; goal>/=4 w/ ileus), Mag down 1.6, phos 3.3, CorCa ~9.7  Renal: SCr 0.66 stable, CrCl >182m/min- stable. Good UOP (2.160mkg/hr) Pulm: extubated post-op, now 95 RA  Cards: developed Afib - now on Amiodarone PO - off Dopamine. On ASA, fenofibrate, IV Lasix '20mg'$  BID, Lopressor, and rosuvastatin.  Hepatobil: LFTs WNL, albumin 2.7, TG 230>185  Neuro: no acute issues  ID: Post-op antibiotics completed. WBC normal, afeb.  Best Practices: Lovenox  TPN Access:  R IJ; PICC placement ordered TPN start date: 6/16>>  Fluids: NS at 20 ml/h  Current Nutrition:  Full liquid diet restarted 6/19 AM Clinimix E 5/15 @ 95 ml/h + lipids at 10 ml/h - provides 114g protein and 2098 kcal in a 24h period  Nutritional Goals:  2000-2200 and 105-120 gm protein per RD recommendations  Plan:  -Clinimix E 5/15 at 9539mr + IV lipids at 6m52m - provides 114g protein and 2098 kcal in a 24h period (at goal) -MVI and trace elements to TPN -Continue SSI and Levemir as ordered - may need to add a bit to TPN if continued trend up -K runs x2 -Mag sulfate 2g x 1 -F/u AM labs -F/u diet advancement/toleration and ability to wean off TPN   HaleElicia LamparmD, BCPSTug Valley Arh Regional Medical Centernical Pharmacist Pager [phone] 36879/2017 8:52 AM

## 2016-01-08 NOTE — Progress Notes (Signed)
Pt has arrived to 2W from 2S. Telemetry box has been applied and CCMD has been notified. Pt reports no pain at this time. Pt is resting comfortably in chair at bedside. Pt has been oriented to room. Call light is within reach. Will continue to monitor.   Grant Fontana BSN, RN

## 2016-01-08 NOTE — Progress Notes (Signed)
6 Days Post-Op Procedure(s) (LRB): CORONARY ARTERY BYPASS GRAFTING (CABG) TIMES 3 USING LEFT INTERNAL MAMMARY ARTERY AND RIGHT SAPHENOUS LEG VEIN HARVESTED ENDOSCOPICALLY (N/A) TRANSESOPHAGEAL ECHOCARDIOGRAM (TEE) (N/A) Subjective: Ileus better- had BM nsr - convert to po amio tx to circle bed  Objective: Vital signs in last 24 hours: Temp:  [98 F (36.7 C)-98.6 F (37 C)] 98.6 F (37 C) (06/19 0407) Pulse Rate:  [73-100] 92 (06/19 0700) Cardiac Rhythm:  [-] Normal sinus rhythm (06/19 0430) Resp:  [13-27] 20 (06/19 0700) BP: (105-148)/(60-131) 131/63 mmHg (06/19 0500) SpO2:  [89 %-98 %] 95 % (06/19 0700) Weight:  [221 lb 1.9 oz (100.3 kg)] 221 lb 1.9 oz (100.3 kg) (06/19 0500)  Hemodynamic parameters for last 24 hours:    Intake/Output from previous day: 06/18 0701 - 06/19 0700 In: 3275.6 [I.V.:760.6; IV Piggyback:100; TPN:2415] Out: 1031 [Urine:4855] Intake/Output this shift:         Exam    General- alert and comfortable   Lungs- clear without rales, wheezes   Cor- regular rate and rhythm, no murmur , gallop   Abdomen- soft, non-tender   Extremities - warm, non-tender, minimal edema   Neuro- oriented, appropriate, no focal weakness   Lab Results:  Recent Labs  01/07/16 0419 01/08/16 0414  WBC 5.3 5.4  HGB 9.1* 9.4*  HCT 28.7* 29.6*  PLT 173 195   BMET:  Recent Labs  01/07/16 0419 01/08/16 0414  NA 136 135  K 3.7 3.9  CL 99* 103  CO2 28 24  GLUCOSE 194* 191*  BUN 13 13  CREATININE 0.68 0.66  CALCIUM 8.9 8.9    PT/INR: No results for input(s): LABPROT, INR in the last 72 hours. ABG    Component Value Date/Time   PHART 7.326* 01/02/2016 2059   HCO3 21.8 01/02/2016 2059   TCO2 29 01/05/2016 1654   ACIDBASEDEF 4.0* 01/02/2016 2059   O2SAT 89.0 01/02/2016 2059   CBG (last 3)   Recent Labs  01/07/16 1952 01/07/16 2344 01/08/16 0405  GLUCAP 243* 210* 212*    Assessment/Plan: S/P Procedure(s) (LRB): CORONARY ARTERY BYPASS GRAFTING  (CABG) TIMES 3 USING LEFT INTERNAL MAMMARY ARTERY AND RIGHT SAPHENOUS LEG VEIN HARVESTED ENDOSCOPICALLY (N/A) TRANSESOPHAGEAL ECHOCARDIOGRAM (TEE) (N/A) Mobilize Diabetes control Plan for transfer to step-down: see transfer orders cont TPN today   LOS: 6 days    Tharon Aquas Trigt III 01/08/2016

## 2016-01-09 ENCOUNTER — Inpatient Hospital Stay (HOSPITAL_COMMUNITY): Payer: 59

## 2016-01-09 LAB — PHOSPHORUS: Phosphorus: 3.2 mg/dL (ref 2.5–4.6)

## 2016-01-09 LAB — BASIC METABOLIC PANEL
Anion gap: 10 (ref 5–15)
BUN: 16 mg/dL (ref 6–20)
CO2: 25 mmol/L (ref 22–32)
Calcium: 9.7 mg/dL (ref 8.9–10.3)
Chloride: 98 mmol/L — ABNORMAL LOW (ref 101–111)
Creatinine, Ser: 0.69 mg/dL (ref 0.61–1.24)
GFR calc Af Amer: >60 mL/min (ref >60)
GFR calc non Af Amer: >60 mL/min (ref >60)
Glucose, Bld: 216 mg/dL — ABNORMAL HIGH (ref 65–99)
Potassium: 4.1 mmol/L (ref 3.5–5.1)
Sodium: 133 mmol/L — ABNORMAL LOW (ref 135–145)

## 2016-01-09 LAB — GLUCOSE, CAPILLARY
Glucose-Capillary: 223 mg/dL — ABNORMAL HIGH (ref 65–99)
Glucose-Capillary: 266 mg/dL — ABNORMAL HIGH (ref 65–99)
Glucose-Capillary: 169 mg/dL — ABNORMAL HIGH (ref 65–99)
Glucose-Capillary: 154 mg/dL — ABNORMAL HIGH (ref 65–99)

## 2016-01-09 LAB — MAGNESIUM: Magnesium: 1.9 mg/dL (ref 1.7–2.4)

## 2016-01-09 MED ORDER — FUROSEMIDE 40 MG PO TABS
40.0000 mg | ORAL_TABLET | Freq: Every day | ORAL | Status: DC
  Administered 2016-01-10 – 2016-01-11 (×2): 40 mg via ORAL
  Filled 2016-01-09 (×2): qty 1

## 2016-01-09 MED ORDER — POTASSIUM CHLORIDE CRYS ER 20 MEQ PO TBCR
20.0000 meq | EXTENDED_RELEASE_TABLET | Freq: Every day | ORAL | Status: DC
  Administered 2016-01-09 – 2016-01-11 (×3): 20 meq via ORAL
  Filled 2016-01-09 (×3): qty 1

## 2016-01-09 MED ORDER — INSULIN DETEMIR 100 UNIT/ML ~~LOC~~ SOLN
28.0000 [IU] | Freq: Two times a day (BID) | SUBCUTANEOUS | Status: DC
  Administered 2016-01-09 – 2016-01-11 (×5): 28 [IU] via SUBCUTANEOUS
  Filled 2016-01-09 (×6): qty 0.28

## 2016-01-09 NOTE — Progress Notes (Signed)
Utilization review completed.  

## 2016-01-09 NOTE — Progress Notes (Signed)
Patient ambulated 750 ft using front wheel walker with RN on room air tolerated very well.O2 saturation post ambulation is 95%.

## 2016-01-09 NOTE — Progress Notes (Signed)
CARDIAC REHAB PHASE I   PRE:  Rate/Rhythm: 94 SR    BP: sitting 117/63    SaO2: 92 RA  MODE:  Ambulation: 850 ft   POST:  Rate/Rhythm: 110 ST    BP: sitting 131/84     SaO2: 94 RA  Pt moving well with RW. Increased distance. More SOB toward end. To bed after walk.  [id number]  [place], ACSM 01/09/2016 10:36 AM

## 2016-01-09 NOTE — Discharge Instructions (Signed)
Coronary Artery Bypass Grafting, Care After °Refer to this sheet in the next few weeks. These instructions provide you with information on caring for yourself after your procedure. Your health care provider may also give you more specific instructions. Your treatment has been planned according to current medical practices, but problems sometimes occur. Call your health care provider if you have any problems or questions after your procedure. °WHAT TO EXPECT AFTER THE PROCEDURE °Recovery from surgery will be different for everyone. Some people feel well after 3 or 4 weeks, while for others it takes longer. After your procedure, it is typical to have the following: °· Nausea and a lack of appetite.   °· Constipation. °· Weakness and fatigue.   °· Depression or irritability.   °· Pain or discomfort at your incision site. °HOME CARE INSTRUCTIONS °· Take medicines only as directed by your health care provider. Do not stop taking medicines or start any new medicines without first checking with your health care provider. °· Take your pulse as directed by your health care provider. °· Perform deep breathing as directed by your health care provider. If you were given a device called an incentive spirometer, use it to practice deep breathing several times a day. Support your chest with a pillow or your arms when you take deep breaths or cough. °· Keep incision areas clean, dry, and protected. Remove or change any bandages (dressings) only as directed by your health care provider. You may have skin adhesive strips over the incision areas. Do not take the strips off. They will fall off on their own. °· Check incision areas daily for any swelling, redness, or drainage. °· If incisions were made in your legs, do the following: °¨ Avoid crossing your legs.   °¨ Avoid sitting for long periods of time. Change positions every 30 minutes.   °¨ Elevate your legs when you are sitting. °· Wear compression stockings as directed by your  health care provider. These stockings help keep blood clots from forming in your legs. °· Take showers once your health care provider approves. Until then, only take sponge baths. Pat incisions dry. Do not rub incisions with a washcloth or towel. Do not take baths, swim, or use a hot tub until your health care provider approves. °· Eat foods that are high in fiber, such as raw fruits and vegetables, whole grains, beans, and nuts. Meats should be lean cut. Avoid canned, processed, and fried foods. °· Drink enough fluid to keep your urine clear or pale yellow. °· Weigh yourself every day. This helps identify if you are retaining fluid that may make your heart and lungs work harder. °· Rest and limit activity as directed by your health care provider. You may be instructed to: °¨ Stop any activity at once if you have chest pain, shortness of breath, irregular heartbeats, or dizziness. Get help right away if you have any of these symptoms. °¨ Move around frequently for short periods or take short walks as directed by your health care provider. Increase your activities gradually. You may need physical therapy or cardiac rehabilitation to help strengthen your muscles and build your endurance. °¨ Avoid lifting, pushing, or pulling anything heavier than 10 lb (4.5 kg) for at least 6 weeks after surgery. °· Do not drive until your health care provider approves.  °· Ask your health care provider when you may return to work. °· Ask your health care provider when you may resume sexual activity. °· Keep all follow-up visits as directed by your health care   provider. This is important. °SEEK MEDICAL CARE IF: °· You have swelling, redness, increasing pain, or drainage at the site of an incision. °· You have a fever. °· You have swelling in your ankles or legs. °· You have pain in your legs.   °· You gain 2 or more pounds (0.9 kg) a day. °· You are nauseous or vomit. °· You have diarrhea.  °SEEK IMMEDIATE MEDICAL CARE IF: °· You have  chest pain that goes to your jaw or arms. °· You have shortness of breath.   °· You have a fast or irregular heartbeat.   °· You notice a "clicking" in your breastbone (sternum) when you move.   °· You have numbness or weakness in your arms or legs. °· You feel dizzy or light-headed.   °MAKE SURE YOU: °· Understand these instructions. °· Will watch your condition. °· Will get help right away if you are not doing well or get worse. °  °This information is not intended to replace advice given to you by your health care provider. Make sure you discuss any questions you have with your health care provider. °  °Document Released: 01/25/2005 Document Revised: 07/29/2014 Document Reviewed: 12/15/2012 °[employer] Interactive Patient Education ©2016 [employer] ° °

## 2016-01-09 NOTE — Progress Notes (Signed)
PARENTERAL NUTRITION CONSULT NOTE  Pharmacy Consult for TPN Indication: ileus  No Known Allergies  Patient Measurements: Height: '5\' 11"'$  (180.3 cm) Weight: 222 lb 1.6 oz (100.744 kg) IBW/kg (Calculated) : 75.3 Adjusted Body Weight:   84kg  Vital Signs: Temp: 97.9 F (36.6 C) (06/20 0552) Temp Source: Oral (06/20 0552) BP: 125/71 mmHg (06/20 0552) Pulse Rate: 85 (06/20 0552) Intake/Output from previous day: 06/19 0701 - 06/20 0700 In: 1395.8 [P.O.:874; I.V.:156.8; IV Piggyback:50; TPN:315] Out: 3075 [Urine:3075] Intake/Output from this shift:  Labs:  Recent Labs  01/07/16 0419 01/08/16 0414  WBC 5.3 5.4  HGB 9.1* 9.4*  HCT 28.7* 29.6*  PLT 173 195    Recent Labs  01/07/16 0419 01/08/16 0414 01/09/16 0440  NA 136 135 133*  K 3.7 3.9 4.1  CL 99* 103 98*  CO2 '28 24 25  '$ GLUCOSE 194* 191* 216*  BUN '13 13 16  '$ CREATININE 0.68 0.66 0.69  CALCIUM 8.9 8.9 9.7  MG  --  1.6* 1.9  PHOS  --  3.3 3.2  PROT 5.6* 6.0*  --   ALBUMIN 2.4* 2.7*  --   AST 15 16  --   ALT 16* 17  --   ALKPHOS 56 58  --   BILITOT 0.5 0.6  --   PREALBUMIN  --  17.2*  --   TRIG  --  185*  --    Estimated Creatinine Clearance: 109.8 mL/min (by C-G formula based on Cr of 0.69).   Recent Labs  01/08/16 1628 01/08/16 2059 01/09/16 0636  GLUCAP 176* 187* 223*   Insulin Requirements in the past 24 hours:  Levemir 28units BID + 29 units SSI  Admit: 18 YOM diagnosed with severe 3 vessel disease and was admitted for CABG. Post-op has developed and ileus.   Surgeries/Procedures: 6/13: CABG x3  GI: abnormal KUB, with ileus. On Reglan, narcotics reduced. BM 6/19, ileus better. On docusate, simethicone, Dulcolax. PO PPI. Prealbumin 9.4>17.2. To transfer to stepdown 6/19, tolerating full liquids, advanced to carb mod diet. CVTS wants to dc TPN when today's bag finishes  Endo: hx DM, A1C 6.8 this admission. CBGs [id number]. Levemir incr to 25 bid + SSI-resistant tid w/ meals+bedtime (PVT changed  from q4h?)  Lytes: Na 133, K up 4.1 (s/p 2 k runs; goal>/=4 w/ ileus), Mag down 1.6 s/p 2g on 6/19, phos 3.3, CorCa ~9.7  Renal: SCr 0.69 stable, CrCl >16m/min- stable. Good UOP  Pulm: extubated post-op, now 95 RA  Cards: developed Afib - now on Amiodarone PO - off Dopamine. On ASA, fenofibrate, IV Lasix '20mg'$  BID, Lopressor, and rosuvastatin.  Hepatobil: LFTs WNL, albumin 2.7, TG 230>185  Neuro: no acute issues  ID: Post-op antibiotics completed. WBC normal, afeb.  Best Practices: Lovenox  TPN Access:  R IJ; PICC placement ordered TPN start date: 6/16>>  Fluids: NS at 20 ml/h  Current Nutrition:  Full liquid diet restarted 6/19 AM Clinimix E 5/15 @ 95 ml/h + lipids at 10 ml/h - provides 114g protein and 2098 kcal in a 24h period  Nutritional Goals:  2000-2200 and 105-120 gm protein per RD recommendations  Plan:  -Decrease Clinimix E 5/15 to 40 ml/h until bag runs out tonight, then d/c TPN per CVTS note, communicated w/ RN -MVI and trace elements to TPN -Continue SSI and Levemir as ordered   HElicia Lamp PharmD, BThe [facility] HospitalClinical Pharmacist Pager [phone]/20/2017 9:08 AM

## 2016-01-09 NOTE — Discharge Summary (Signed)
Physician Discharge Summary       [address]       [place] [address]             [phone]    Patient ID: Jorge Adams MRN: [id number] DOB/AGE: 1951/05/31 65 y.o.  Admit date: 01/02/2016 Discharge date: 01/11/2016  Admission Diagnoses: 1. CAD (s/p MI,PCI with stent)  Active Diagnoses:  1. Dyslipidemia 2. Hypertension 3. Diabetes mellitus 4. Atherosclerosis of native arteries of the extremities with intermittent claudication 5. ED (erectile dysfunction) 6. Tobacco abuse 7. Post op a fib 8. ABL anemia  Procedure (s):  1. Coronary artery bypass grafting x3 (left internal mammary artery to left anterior descending artery, saphenous vein graft to obtuse marginal 1, saphenous vein graft to posterior descending). 2. Endoscopic harvest of right leg greater saphenous vein by Dr. Prescott Gum on 01/02/2016.  History of Presenting Illness: This is a 65 year old obese Caucasian diabetic reformed smoker with prior history of CAD status post stent to the circumflex marginal 12 years ago who presented for evaluation and treatment of recently diagnosed severe three-vessel CAD. Patient developed exertional shortness of breath and chest tightness which is increasing in frequency and intensity. He was evaluated by Dr. Einar Gip with a stress test-perfusion study which showed an ischemic response in the inferior wall. Echocardiogram showed normal overall LV function with some hypokinesia of the inferior wall. There is no significant valvular disease. The patient underwent cardiac catheterization via right radial artery which demonstrated chronic occlusion the RCA with a graftable posterior descending, 80-90% stenosis of the OM1 with a patent stent proximally and a stenosis distal to the stent, and a 90% stenosis of the LAD. LVEDP was less than 10 mm mercury.  Because of the patient's severe three-vessel coronary disease, diabetes, and symptoms of progressive angina, the  patient is felt a candidate for surgical coronary revascularization.  Patient also has history of peripheral vascular disease.Recent ABIs showed right 1.14 and left 0.80. Carotid duplex studies showed no significant bilateral internal carotid artery stenosis.  The patient's only surgical history is a left knee arthroscopy under general anesthesia which he tolerated well without complication.  No family history of CABG or premature MI. Positive history for hypertension, dyslipidemia, obesity, diabetes, and he quit smoking in 2004. 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 He remained afebrile and hemodynamically stable. He was weaned off low dose Neo Synephrine and Dopmdrip. Gordy Councilman, a line, chest tubes, and foley were removed early in the post operative course. Lopressor was started and titrated accordingly. He was volume over loaded and diuresed. He developed abdominal distention and ultimately was found to have an ileus. He was made NPO and given TPN. His ileus did resolve with time. He had ABL anemia. He did not require a post op transfusion. His last H and H was 9.4 and 29.6. He was weaned off the insulin drip. The patient's HGA1C pre op was 6.8. Once he was tolerating a diet, Metformin was restarted.  The patient's glucose remained well controlled. He went into a fib and was put on Amiodarone. He converted and remained in sinus rhythm. The patient was felt surgically stable for transfer from the ICU to PCTU for further convalescence on 01/08/2016. He continues to progress with cardiac rehab. He was ambulating on room air. He has been tolerating a diet and has had a bowel movement. Epicardial pacing wires were  removed on 06/20. Chest tube sutures will be removed the day of discharge. The patient is felt surgically stable for discharge today.    Latest Vital Signs: Blood pressure 124/67,  pulse 78, temperature 98.2 F (36.8 C), temperature source Oral, resp. rate 18, height '5\' 11"'$  (1.803 m), weight 221 lb (100.245 kg), SpO2 97 %.  Physical Exam: Cardiovascular: RRR Pulmonary: Slightly diminished at bases Abdomen: Soft, non tender, protuberant,bowel sounds present. Extremities: Mild bilateral lower extremity edema. Wounds: Clean and dry. No erythema or signs of infection.  Discharge Condition:Stable and discharged to home  Recent laboratory studies:  Lab Results  Component Value Date   WBC 5.4 01/08/2016   HGB 9.4* 01/08/2016   HCT 29.6* 01/08/2016   MCV 84.3 01/08/2016   PLT 195 01/08/2016   Lab Results  Component Value Date   NA 134* 01/11/2016   K 4.1 01/11/2016   CL 100* 01/11/2016   CO2 25 01/11/2016   CREATININE 0.76 01/11/2016   GLUCOSE 115* 01/11/2016    Diagnostic Studies: Dg Chest 2 View  01/09/2016  CLINICAL DATA:  One week status post see a BG. Wheezing on exam; no history of asthma, former smoker. EXAM: CHEST  2 VIEW COMPARISON:  Portable chest x-ray of January 06, 2016 FINDINGS: There is patchy increased density peripherally in the left mid lung and in the right infrahilar region. The findings are more conspicuous today. The cardiac silhouette remains enlarged. The pulmonary vascularity is not clearly engorged. There is a small left pleural effusion. The sternal wires are intact. The mediastinum is normal in width. The PICC line tip projects over the midportion of the SVC. IMPRESSION: Patchy interstitial density bilaterally is worrisome for subsegmental atelectasis or early pneumonia. No significant interstitial edema is observed today. A small left pleural effusion remains. Electronically Signed   By: David  Martinique M.D.   On: 01/09/2016 08:55    Dg Abd Portable 1v  01/08/2016  CLINICAL DATA:  Ileus, CABG six days ago EXAM: PORTABLE ABDOMEN - 1 VIEW COMPARISON:  Portable abdominal radiograph of January 06, 2016 FINDINGS: There is a moderate amount of gas  within small and large bowel loops and within the rectum. No obstructive pattern is observed. No free extraluminal gas collections are demonstrated. The soft tissues are otherwise unremarkable. There are mild degenerative changes of the lumbar discs. IMPRESSION: The bowel gas pattern is within the limits of normal. There is no evidence of obstruction nor significant ileus. Electronically Signed   By: David  Martinique M.D.   On: 01/08/2016 07:27      Discharge Instructions    Amb Referral to Cardiac Rehabilitation    Complete by:  As directed   Diagnosis:  CABG  CABG X ___:  3          Discharge Medications:   Medication List    STOP taking these medications        amLODipine-olmesartan 5-40 MG tablet  Commonly known as:  AZOR     nitroGLYCERIN 0.4 MG SL tablet  Commonly known as:  NITROSTAT      TAKE these medications        amiodarone 200 MG tablet  Commonly known as:  PACERONE  Take 1 tablet (200 mg total) by mouth 2 (two) times daily. For 5 days;then take Amiodarone 200 mg daily by mouth thereafter     aspirin EC 81 MG tablet  Take 81 mg by mouth daily.     brimonidine 0.1 % Soln  Commonly known as:  ALPHAGAN P  Place 1 drop into both eyes 2 (two) times daily.     cilostazol 100 MG tablet  Commonly known as:  PLETAL  Take 100 mg by mouth 2 (two) times daily.     dorzolamide-timolol 22.3-6.8 MG/ML ophthalmic solution  Commonly known as:  COSOPT  Place 1 drop into both eyes 2 (two) times daily.     empagliflozin 10 MG Tabs tablet  Commonly known as:  JARDIANCE  Take 10 mg by mouth daily.     fenofibrate 145 MG tablet  Commonly known as:  TRICOR  Take 145 mg by mouth daily.     furosemide 40 MG tablet  Commonly known as:  LASIX  Take 1 tablet (40 mg total) by mouth daily. For 4 days then stop.     metFORMIN 1000 MG tablet  Commonly known as:  GLUCOPHAGE  Take 1 tablet (1,000 mg total) by mouth 2 (two) times daily.     metoprolol tartrate 25 MG tablet    Commonly known as:  LOPRESSOR  Take 0.5 tablets (12.5 mg total) by mouth 2 (two) times daily.     NEO-SYNEPHRINE 12 HOUR SPRAY 0.05 % nasal spray  Generic drug:  oxymetazoline  Place 1 spray into both nostrils 2 (two) times daily as needed for congestion.     potassium chloride SA 20 MEQ tablet  Commonly known as:  K-DUR,KLOR-CON  Take 1 tablet (20 mEq total) by mouth daily. For 4 days then stop.     rosuvastatin 20 MG tablet  Commonly known as:  CRESTOR  Take 20 mg by mouth daily.     traMADol 50 MG tablet  Commonly known as:  ULTRAM  Take 1-2 tablets (50-100 mg total) by mouth every 4 (four) hours as needed for moderate pain.     TRAVATAN Z 0.004 % Soln ophthalmic solution  Generic drug:  Travoprost (BAK Free)  Place 1 drop into both eyes at bedtime.       The patient has been discharged on:   1.Beta Blocker:  Yes [ x  ]                              No   [   ]                              If No, reason:  2.Ace Inhibitor/ARB: Yes [   ]                                     No  [ x   ]                                     If No, reason:Labile BP  3.Statin:   Yes [ x  ]                  No  [   ]                  If No, reason:  4.Ecasa:  Yes  [x ]                  No   [   ]  If No, reason:  Follow Up Appointments: Follow-up Information    Follow up with Ivin Poot III, MD On 02/14/2016.   Specialty:  Cardiothoracic Surgery   Why:  PA/LAT CXR to be taken (at [facility] which is in the same building as Dr. Lucianne Lei Trigt's office) on 02/14/2016 at 11:00 am;Appointment time is at 11:30 am   Contact information:   [place] [address] [phone]       Follow up with Adrian Prows, MD.   Specialty:  Cardiology   Why:  Please call for a follow up appointment for 2 weeks   Contact information:   [address] [place] [address] [phone]       Follow up with Elsie Stain, MD.   Specialty:   Family Medicine   Why:  Call for a follow up appointment regarding further management of diabetes and surveillance of [facility]    Contact information:   [place] [address] [phone]       Signed: Lars Pinks MPA-C 01/11/2016, 9:55 AM

## 2016-01-09 NOTE — Progress Notes (Signed)
Epicardial pacing wires have been removed per MD order. Pt tolerated removal well. Wires were intact upon removal. Pt's vitals are stable. Pt is resting in bed with call-light within reach at this time. Pt's vitals are being monitored every 15 minutes for the next 2 hours, per protocol. Will continue to monitor.   Grant Fontana BSN, RN

## 2016-01-09 NOTE — Progress Notes (Addendum)
      [address]       [place] [address]             [phone]        7 Days Post-Op Procedure(s) (LRB): CORONARY ARTERY BYPASS GRAFTING (CABG) TIMES 3 USING LEFT INTERNAL MAMMARY ARTERY AND RIGHT SAPHENOUS LEG VEIN HARVESTED ENDOSCOPICALLY (N/A) TRANSESOPHAGEAL ECHOCARDIOGRAM (TEE) (N/A)  Subjective: Patient with bowel movement and is tolerating full liquid diet.  Objective: Vital signs in last 24 hours: Temp:  [97.8 F (36.6 C)-98.3 F (36.8 C)] 97.9 F (36.6 C) (06/20 0552) Pulse Rate:  [80-95] 85 (06/20 0552) Cardiac Rhythm:  [-] Normal sinus rhythm (06/20 0552) Resp:  [18-24] 18 (06/20 0552) BP: (113-139)/(65-75) 125/71 mmHg (06/20 0552) SpO2:  [95 %-100 %] 96 % (06/20 0552) Weight:  [222 lb 1.6 oz (100.744 kg)] 222 lb 1.6 oz (100.744 kg) (06/20 0102)  Pre op weight 101 kg Current Weight  01/09/16 222 lb 1.6 oz (100.744 kg)      Intake/Output from previous day: 06/19 0701 - 06/20 0700 In: 1395.8 [P.O.:874; I.V.:156.8; IV Piggyback:50; TPN:315] Out: 3075 [Urine:3075]   Physical Exam:  Cardiovascular: RRR Pulmonary: Slightly diminished at bases Abdomen: Soft, non tender, protuberant,bowel sounds present. Extremities: Mild bilateral lower extremity edema. Wounds: Clean and dry.  No erythema or signs of infection.  Lab Results: CBC: Recent Labs  01/07/16 0419 01/08/16 0414  WBC 5.3 5.4  HGB 9.1* 9.4*  HCT 28.7* 29.6*  PLT 173 195   BMET:  Recent Labs  01/08/16 0414 01/09/16 0440  NA 135 133*  K 3.9 4.1  CL 103 98*  CO2 24 25  GLUCOSE 191* 216*  BUN 13 16  CREATININE 0.66 0.69  CALCIUM 8.9 9.7    PT/INR:  Lab Results  Component Value Date   INR 1.43 01/02/2016   INR 1.00 01/01/2016   ABG:  INR: Will add last result for INR, ABG once components are confirmed Will add last 4 CBG results once components are confirmed  Assessment/Plan:  1. CV - Previous a fib. Maintaining SR in the 80's. On Amiodarone 400 mg bid  and Lopressor 12.5 mg bid. Will start low dose ACE in am if BP will tolerate. 2.  Pulmonary - On room air. Encourage incentive spirometer. Will order PA/LAT for this am. 3. Volume Overload - On Lasix 20 mg IV bid. Will change to oral in am as already given IV am dose. 4.  Acute blood loss anemia - H and H yesterday stable at 9.4 and 29.6 5. GI-resolving ileus. On TPN and tolerating full liquids. Advance diet and stop TPN after bag finished  6. DM-CBGs 176/187/223. On Insulin only. Will increase Insulin for better glucose control.Will restart Metformin once tolerating diet better. Pre op HGA1C 6.8 7. Remove EPW 8. Hopefully, discharge by Thursday or Friday  ZIMMERMAN,DONIELLE MPA-C 01/09/2016,7:39 AM  Advance diet slowly for significant ileus post CABG patient examined and medical record reviewed,agree with above note. Tharon Aquas Trigt III 01/09/2016

## 2016-01-09 NOTE — Care Management Note (Signed)
Case Management Note Marvetta Gibbons RN, BSN Unit 2W-Case Manager [phone]  Patient Details  Name: Jorge Adams MRN: [id number] Date of Birth: 1951/05/19  Subjective/Objective:  Pt s/p CABG with post op ileus- tx from ICU to 2W on 01/08/16                Action/Plan: PTA pt lived at home with spouse- anticipate return home- CM to follow for d/c needs  Expected Discharge Date:                  Expected Discharge Plan:  [place]  In-House Referral:     Discharge planning Services  CM Consult  Post Acute Care Choice:    Choice offered to:     DME Arranged:    DME Agency:     HH Arranged:    [place] Agency:     Status of Service:  In process, will continue to follow  If discussed at Long Length of Stay Meetings, dates discussed:    Additional Comments:  Dawayne Patricia, RN 01/09/2016, 2:21 PM

## 2016-01-10 LAB — GLUCOSE, CAPILLARY
Glucose-Capillary: 130 mg/dL — ABNORMAL HIGH (ref 65–99)
Glucose-Capillary: 216 mg/dL — ABNORMAL HIGH (ref 65–99)
Glucose-Capillary: 121 mg/dL — ABNORMAL HIGH (ref 65–99)
Glucose-Capillary: 131 mg/dL — ABNORMAL HIGH (ref 65–99)

## 2016-01-10 NOTE — Progress Notes (Signed)
CARDIAC REHAB PHASE I   PRE:  Rate/Rhythm: 76 SR  BP:  Sitting: 124/65        SaO2: 99 RA  MODE:  Ambulation: 690 ft   POST:  Rate/Rhythm: 89 SR  BP:  Sitting: 135/74         SaO2: 98 RA  Pt ambulated 690 ft on RA, IV, rolling walker, assist x1, steady gait, tolerated well. Pt c/o mild DOE, denies any other complaints. Encouraged IS, additional ambulation x1 more today. Pt to recliner after walk, feet elevated, call bell within reach. Will follow.  [phone] Lenna Sciara, RN, BSN 01/10/2016 2:01 PM

## 2016-01-10 NOTE — Progress Notes (Addendum)
      [address]       [place] [address]             [phone]        8 Days Post-Op Procedure(s) (LRB): CORONARY ARTERY BYPASS GRAFTING (CABG) TIMES 3 USING LEFT INTERNAL MAMMARY ARTERY AND RIGHT SAPHENOUS LEG VEIN HARVESTED ENDOSCOPICALLY (N/A) TRANSESOPHAGEAL ECHOCARDIOGRAM (TEE) (N/A)  Subjective: Patient tolerated carb diet yesterday. He is passing flatus this am. Denies abdominal pain, nausea, or vomiting. He states his belly feels a little tight.  Objective: Vital signs in last 24 hours: Temp:  [97.7 F (36.5 C)-98.6 F (37 C)] 98.6 F (37 C) (06/21 0307) Pulse Rate:  [78-95] 82 (06/21 0307) Cardiac Rhythm:  [-] Normal sinus rhythm (06/21 0700) Resp:  [18-20] 18 (06/21 0307) BP: (115-140)/(59-78) 115/59 mmHg (06/21 0307) SpO2:  [95 %-98 %] 95 % (06/21 0307) Weight:  [222 lb 8 oz (100.925 kg)] 222 lb 8 oz (100.925 kg) (06/21 0304)  Pre op weight 101 kg Current Weight  01/10/16 222 lb 8 oz (100.925 kg)      Intake/Output from previous day: 06/20 0701 - 06/21 0700 In: 260 [P.O.:240; I.V.:20] Out: 1150 [Urine:1150]   Physical Exam:  Cardiovascular: RRR Pulmonary: Slightly diminished at bases Abdomen: Soft, non tender, protuberant,sporadic bowel sounds present. Extremities: Mild bilateral lower extremity edema R>L Wounds: Clean and dry.  No erythema or signs of infection.  Lab Results: CBC:  Recent Labs  01/08/16 0414  WBC 5.4  HGB 9.4*  HCT 29.6*  PLT 195   BMET:   Recent Labs  01/08/16 0414 01/09/16 0440  NA 135 133*  K 3.9 4.1  CL 103 98*  CO2 24 25  GLUCOSE 191* 216*  BUN 13 16  CREATININE 0.66 0.69  CALCIUM 8.9 9.7    PT/INR:  Lab Results  Component Value Date   INR 1.43 01/02/2016   INR 1.00 01/01/2016   ABG:  INR: Will add last result for INR, ABG once components are confirmed Will add last 4 CBG results once components are confirmed  Assessment/Plan:  1. CV - Previous a fib. Maintaining SR in the  80's. On Amiodarone 400 mg bid and Lopressor 12.5 mg bid. Will start low dose ACE in am if BP will tolerate. 2.  Pulmonary - On room air. Encourage incentive spirometer.  3. Volume Overload - Will give Lasix 40 mg daily. 4.  Acute blood loss anemia - H and H yesterday stable at 9.4 and 29.6 5. GI-resolved ileus. Patient encouraged to go slow with diet. 6. DM-CBGs 169/154/130. On Insulin only. Will increase Insulin for better glucose control.Will restart Metformin once tolerating diet better. Pre op HGA1C 6.8 8. Hopefully, discharge in am  ZIMMERMAN,DONIELLE MPA-C 01/10/2016,9:06 AM   Patient had to pass normal bowel movement but close--his stools are slightly formed Abdominal exam benign a day On heart healthy diet-carb modified Plan discharge home tomorrow patient examined and medical record reviewed,agree with above note. Tharon Aquas Trigt III 01/10/2016

## 2016-01-11 ENCOUNTER — Telehealth: Payer: Self-pay

## 2016-01-11 LAB — COMPREHENSIVE METABOLIC PANEL
ALT: 28 U/L (ref 17–63)
AST: 28 U/L (ref 15–41)
Albumin: 2.9 g/dL — ABNORMAL LOW (ref 3.5–5.0)
Alkaline Phosphatase: 80 U/L (ref 38–126)
Anion gap: 9 (ref 5–15)
BUN: 14 mg/dL (ref 6–20)
CO2: 25 mmol/L (ref 22–32)
Calcium: 9.2 mg/dL (ref 8.9–10.3)
Chloride: 100 mmol/L — ABNORMAL LOW (ref 101–111)
Creatinine, Ser: 0.76 mg/dL (ref 0.61–1.24)
GFR calc Af Amer: >60 mL/min (ref >60)
GFR calc non Af Amer: >60 mL/min (ref >60)
Glucose, Bld: 115 mg/dL — ABNORMAL HIGH (ref 65–99)
Potassium: 4.1 mmol/L (ref 3.5–5.1)
Sodium: 134 mmol/L — ABNORMAL LOW (ref 135–145)
Total Bilirubin: 0.7 mg/dL (ref 0.3–1.2)
Total Protein: 6.4 g/dL — ABNORMAL LOW (ref 6.5–8.1)

## 2016-01-11 LAB — PHOSPHORUS: Phosphorus: 3.6 mg/dL (ref 2.5–4.6)

## 2016-01-11 LAB — MAGNESIUM: Magnesium: 1.7 mg/dL (ref 1.7–2.4)

## 2016-01-11 LAB — GLUCOSE, CAPILLARY: Glucose-Capillary: 110 mg/dL — ABNORMAL HIGH (ref 65–99)

## 2016-01-11 MED ORDER — AMIODARONE HCL 200 MG PO TABS
200.0000 mg | ORAL_TABLET | Freq: Two times a day (BID) | ORAL | Status: DC
  Administered 2016-01-11: 200 mg via ORAL
  Filled 2016-01-11: qty 1

## 2016-01-11 MED ORDER — AMIODARONE HCL 200 MG PO TABS: 200.0000 mg | ORAL_TABLET | Freq: Two times a day (BID) | ORAL | Status: DC

## 2016-01-11 MED ORDER — FUROSEMIDE 40 MG PO TABS: 40.0000 mg | ORAL_TABLET | Freq: Every day | ORAL | Status: DC

## 2016-01-11 MED ORDER — POTASSIUM CHLORIDE CRYS ER 20 MEQ PO TBCR: 20.0000 meq | EXTENDED_RELEASE_TABLET | Freq: Every day | ORAL | Status: DC

## 2016-01-11 MED ORDER — METOPROLOL TARTRATE 25 MG PO TABS: 12.5000 mg | ORAL_TABLET | Freq: Two times a day (BID) | ORAL | Status: DC

## 2016-01-11 MED ORDER — TRAMADOL HCL 50 MG PO TABS: 50.0000 mg | ORAL_TABLET | ORAL | Status: DC | PRN

## 2016-01-11 MED ORDER — ASPIRIN 81 MG PO CHEW: 324.0000 mg | CHEWABLE_TABLET | Freq: Every day | ORAL | Status: DC

## 2016-01-11 NOTE — Telephone Encounter (Signed)
Transition Care Management Follow-up Telephone Call     Date discharged? 01/11/16        How have you been since you were released from the hospital? Recovering   Any patient concerns? None   Do you understand why you were in the hospital? Yes   Do you understand the discharge instructions? Yes   Where were you discharged to? Home   Items Reviewed:  Medications reviewed: Yes   Allergies reviewed: Yes   Dietary changes reviewed: Yes  Referrals reviewed: Yes   Functional Questionnaire:  Independent - I Dependent - D    Activities of Daily Living (ADLs):  Pt stated he gets assistance with ADLs as needed from spouse. Pt also has order for [facility] 2x weekly. Pt is currently on riding and driving restrictions.    Confirmed importance and date/time of follow-up visits scheduled NO  Provider Appointment booked with PCP None at this time due to activity restrictions  Confirmed with patient if condition begins to worsen call PCP or go to the ER.  Patient was given the office number and encouraged to call back with question or concerns: YES

## 2016-01-11 NOTE — Progress Notes (Addendum)
      [address]       [place] [address]             [phone]        9 Days Post-Op Procedure(s) (LRB): CORONARY ARTERY BYPASS GRAFTING (CABG) TIMES 3 USING LEFT INTERNAL MAMMARY ARTERY AND RIGHT SAPHENOUS LEG VEIN HARVESTED ENDOSCOPICALLY (N/A) TRANSESOPHAGEAL ECHOCARDIOGRAM (TEE) (N/A)  Subjective: Patient without complaints this am and wants to go home.  Objective: Vital signs in last 24 hours: Temp:  [97.9 F (36.6 C)-99.3 F (37.4 C)] 98.2 F (36.8 C) (06/22 0441) Pulse Rate:  [78-86] 78 (06/22 0441) Cardiac Rhythm:  [-] Normal sinus rhythm (06/22 0441) Resp:  [18] 18 (06/22 0441) BP: (124-144)/(65-73) 124/67 mmHg (06/22 0441) SpO2:  [97 %-100 %] 97 % (06/22 0441) Weight:  [221 lb (100.245 kg)] 221 lb (100.245 kg) (06/22 0442)  Pre op weight 101 kg Current Weight  01/11/16 221 lb (100.245 kg)      Intake/Output from previous day: 06/21 0701 - 06/22 0700 In: 1000 [P.O.:960; I.V.:40] Out: 1025 [Urine:1025]   Physical Exam:  Cardiovascular: RRR Pulmonary: Slightly diminished at bases Abdomen: Soft, non tender, protuberant,sporadic bowel sounds present. Extremities: Trace bilateral lower extremity edema R>L Wounds: Clean and dry.  No erythema or signs of infection.  Lab Results: CBC: No results for input(s): WBC, HGB, HCT, PLT in the last 72 hours. BMET:   Recent Labs  01/09/16 0440 01/11/16 0345  NA 133* 134*  K 4.1 4.1  CL 98* 100*  CO2 25 25  GLUCOSE 216* 115*  BUN 16 14  CREATININE 0.69 0.76  CALCIUM 9.7 9.2    PT/INR:  Lab Results  Component Value Date   INR 1.43 01/02/2016   INR 1.00 01/01/2016   ABG:  INR: Will add last result for INR, ABG once components are confirmed Will add last 4 CBG results once components are confirmed  Assessment/Plan:  1. CV - Previous a fib. Maintaining SR in the 80's. On Amiodarone 400 mg bid and Lopressor 12.5 mg bid. Will  decrease Amiodarone. Hope to start ACE after  discharge. 2.  Pulmonary - On room air. Encourage incentive spirometer.  3. Volume Overload - Will give Lasix 40 mg daily. 4.  Acute blood loss anemia - H and H yesterday stable at 9.4 and 29.6 5. GI-resolved ileus. . 6. DM-CBGs 121/131/110. On Insulin only.Will restart Metformin once tolerating diet better. Pre op HGA1C 6.8 8. Discharge  ZIMMERMAN,DONIELLE MPA-C 01/11/2016,7:37 AM      patient examined and medical record reviewed,agree with above note. Tharon Aquas Trigt III 01/11/2016

## 2016-01-11 NOTE — Progress Notes (Signed)
Patient to D/C home with wife. Patient education done with wife at bedside. Handout given. Personal belongings including upper and lower dentures given to patient. IV removed. Tele monitor removed. CCMD notified. Patient wheeled out to D/C home with wife.   Domingo Dimes RN

## 2016-01-11 NOTE — Progress Notes (Signed)
Ed completed with pt and wife. Voiced understanding with appropriate questions.  [id number] [place], ACSM 12:15 PM 01/11/2016

## 2016-01-11 NOTE — Telephone Encounter (Addendum)
A1c 6.8 on 01/01/16.  That is okay for now.  Update me re: AM sugars next week and we'll go from there.  Wouldn't need repeat A1c until ~04/02/16.  I wish him well with his recovery.   Thanks.

## 2016-01-11 NOTE — Care Management Note (Signed)
Case Management Note Marvetta Gibbons RN, BSN Unit 2W-Case Manager [phone]  Patient Details  Name: Jorge Adams MRN: [id number] Date of Birth: May 06, 1951  Subjective/Objective:  Pt s/p CABG with post op ileus- tx from ICU to 2W on 01/08/16                Action/Plan: PTA pt lived at home with spouse- anticipate return home- CM to follow for d/c needs  Expected Discharge Date:     01/11/16             Expected Discharge Plan:  [place]  In-House Referral:     Discharge planning Services  CM Consult  Post Acute Care Choice:  Home Health Choice offered to:  Patient, Spouse  DME Arranged:  N/A DME Agency:  NA  HH Arranged:  RN [place] Agency:  [place]  Status of Service:  Completed, signed off  If discussed at [place] of Stay Meetings, dates discussed:    Additional Comments:  01/11/16- 1150- Marvetta Gibbons RN, BSN- pt for d/c home today- order written for [facility]- spoke with pt and wife at bedside- pt agreeable to [facility]- address and phone # confirmed in epic- pt's wife- Juliann Pulse would also like # given to [facility] agency- [phone]- choice offered for [facility] agency- per choice they would like to use [facility] for services- referral called to Butch Penny with [facility] for [facility]- no further CM needs noted.   Dawayne Patricia, RN 01/11/2016, 11:57 AM

## 2016-01-11 NOTE — Progress Notes (Signed)
CARDIAC REHAB PHASE I   PRE:  Rate/Rhythm: 85 SR    BP: sitting 121/71    SaO2:   MODE:  Ambulation: 890 ft   POST:  Rate/Rhythm: 102 ST    BP: sitting 137/65     SaO2:   Tolerated well. Encouraged pt to walk without RW which he was able to do. Steady, rest x2 for fatigue/DOE. He has a RW at home if he needs it however his driveway is gravel. Will f/u at 11:00 to do education with pt and wife. We did discuss [facility] and I will send referral to [place] [facility]. [phone]   [place], ACSM 01/11/2016 8:31 AM

## 2016-01-12 NOTE — Telephone Encounter (Signed)
Patient advised and thanks you for your well wishes on his recovery.

## 2016-01-18 ENCOUNTER — Telehealth: Payer: Self-pay | Admitting: *Deleted

## 2016-01-18 ENCOUNTER — Telehealth: Payer: Self-pay

## 2016-01-18 NOTE — Telephone Encounter (Signed)
Wife advised.  Pt has appt with cards next week.

## 2016-01-18 NOTE — Telephone Encounter (Signed)
Patient was asked to check BS for several days and report readings.  All readings were around 7:15 to 8 am before breakfast. 01/12/16 111 6/24  132 6/25  178 6/26  141  6/27  137 6/28  163 6/29  140

## 2016-01-18 NOTE — Telephone Encounter (Signed)
Pt's wife left vm (DPR signed) pt was recently in hospital for triple bypass; pt was on Azor and Crestor before going into hospital; now Crestor is on med list and Azor is off the med list. Pts wife wants to know if should be taking Azor. Per hx med list 01/11/16 Azor stopped at discharge. Please advise.

## 2016-01-18 NOTE — Telephone Encounter (Signed)
Per dc summary, was advised to stop azor.  Unless restarted at f/u appointment (and this will likely be addressed then), then I wouldn't restart it in the meantime.  Thanks.

## 2016-01-19 NOTE — Telephone Encounter (Signed)
Patient advised.

## 2016-01-19 NOTE — Telephone Encounter (Signed)
Given the A1c 6.8, and the sugars as low as 111 in the AM, I would continue as is for now.  I would expect that as he is recovering and gradually more active, his sugars may drift downward.  We can talk about it the next time I see him in clinic.  If sugars drift upward, ie not <130s in the AM and continues upward, then let me know in the meantime.  Thanks.

## 2016-01-28 DIAGNOSIS — E1165 Type 2 diabetes mellitus with hyperglycemia: Secondary | ICD-10-CM | POA: Diagnosis not present

## 2016-01-28 DIAGNOSIS — Z48812 Encounter for surgical aftercare following surgery on the circulatory system: Secondary | ICD-10-CM | POA: Diagnosis not present

## 2016-01-28 DIAGNOSIS — I70213 Atherosclerosis of native arteries of extremities with intermittent claudication, bilateral legs: Secondary | ICD-10-CM | POA: Diagnosis not present

## 2016-01-28 DIAGNOSIS — I25119 Atherosclerotic heart disease of native coronary artery with unspecified angina pectoris: Secondary | ICD-10-CM | POA: Diagnosis not present

## 2016-02-09 ENCOUNTER — Other Ambulatory Visit: Payer: Self-pay | Admitting: Cardiothoracic Surgery

## 2016-02-09 DIAGNOSIS — Z951 Presence of aortocoronary bypass graft: Secondary | ICD-10-CM

## 2016-02-12 ENCOUNTER — Ambulatory Visit (INDEPENDENT_AMBULATORY_CARE_PROVIDER_SITE_OTHER): Payer: Self-pay | Admitting: Physician Assistant

## 2016-02-12 ENCOUNTER — Ambulatory Visit
Admission: RE | Admit: 2016-02-12 | Discharge: 2016-02-12 | Disposition: A | Discharge status: Home / Self Care | Payer: 59 | Source: Ambulatory Visit | Attending: Cardiothoracic Surgery | Admitting: Cardiothoracic Surgery

## 2016-02-12 VITALS — BP 117/75 | HR 79 | Resp 16 | Ht 71.0 in | Wt 201.0 lb

## 2016-02-12 DIAGNOSIS — Z951 Presence of aortocoronary bypass graft: Secondary | ICD-10-CM

## 2016-02-12 DIAGNOSIS — I2510 Atherosclerotic heart disease of native coronary artery without angina pectoris: Secondary | ICD-10-CM

## 2016-02-12 NOTE — Progress Notes (Signed)
  HPI:  Patient returns for routine postoperative follow-up having undergone CABG x 3 on 01/02/2016 by Dr. Prescott Gum. The patient's early postoperative recovery while in the hospital was notable for atrial fibrillation.   Current Outpatient Prescriptions  Medication Sig Dispense Refill  . amiodarone (PACERONE) 200 MG tablet Take 1 tablet (200 mg total) by mouth 2 (two) times daily. For 5 days;then take Amiodarone 200 mg daily by mouth thereafter 60 tablet 1  . aspirin EC 81 MG tablet Take 81 mg by mouth daily.    . brimonidine (ALPHAGAN P) 0.1 % SOLN Place 1 drop into both eyes 2 (two) times daily.    . cilostazol (PLETAL) 100 MG tablet Take 100 mg by mouth 2 (two) times daily.  3  . dorzolamide-timolol (COSOPT) 22.3-6.8 MG/ML ophthalmic solution Place 1 drop into both eyes 2 (two) times daily.    . empagliflozin (JARDIANCE) 10 MG TABS tablet Take 10 mg by mouth daily. 30 tablet 12  . fenofibrate (TRICOR) 145 MG tablet Take 145 mg by mouth daily.    . metFORMIN (GLUCOPHAGE) 1000 MG tablet Take 1 tablet (1,000 mg total) by mouth 2 (two) times daily. 180 tablet 1  . metoprolol (LOPRESSOR) 25 MG tablet Take 0.5 tablets (12.5 mg total) by mouth 2 (two) times daily. (Patient taking differently: Take 25 mg by mouth 2 (two) times daily. ) 30 tablet 1  . rosuvastatin (CRESTOR) 20 MG tablet Take 20 mg by mouth daily.     . TRAVATAN Z 0.004 % SOLN ophthalmic solution Place 1 drop into both eyes at bedtime.    Vital Signs: BP 117/75, RR 16, Oxygen saturation 97% on room air   Physical Exam: CV-RRR Pulmonary-Clear to auscultation bilaterally Abdomen-Soft, non tender, bowel sounds Extremities-No lower extremity edema Wounds-Clean and dry. Small eschar on chest tube wound.  Diagnostic Tests: EXAM: CHEST  2 VIEW COMPARISON:  PA and lateral chest x-ray of January 09, 2016 FINDINGS: The right lung is well-expanded and clear. On the left patchy interstitial density in the mid lung has nearly totally  resolved. There is no pleural effusion or pneumothorax. The cardiac silhouette remains top-normal in size. The pulmonary vascularity is normal. The sternal wires are intact. IMPRESSION: Further interval improvement in the appearance of the chest since the previous study with resolution of small left pleural effusion and near total resolution of patchy peripheral atelectasis. No CHF. Electronically Signed   By: David  Martinique M.D.   On: 02/12/2016 12:43  Impression and Plan: Overall, Jorge Adams is doing well s/p coronary artery bypass grafting surgery. He has already seen Dr. Einar Gip in follow up. The only change to his medication has been to increase Lopressor to 25 mg bid. He has another follow up appointment 02/23/2016. Patient reports he has not taken pain medication in weeks. He was instructed he may begin driving short distances during the day (i.e. 30 minutes or less) and gradually increase his frequency and duration as tolerates. He was instructed to continue with sternal precautions (i.e. no lifting more than 10 pounds) for 2 more weeks. He was contacted by cardiac rehab in [place]. I encouraged him to participate in it.  He will return to see Dr. Prescott Gum PRN.   Nani Skillern, PA-C [facility] [phone]

## 2016-02-14 ENCOUNTER — Ambulatory Visit: Payer: Medicare Other

## 2016-02-14 ENCOUNTER — Ambulatory Visit: Payer: Medicare Other | Admitting: Cardiothoracic Surgery

## 2016-02-20 ENCOUNTER — Encounter: Payer: 59 | Attending: Cardiology | Admitting: *Deleted

## 2016-02-20 VITALS — Ht 70.3 in | Wt 215.1 lb

## 2016-02-20 DIAGNOSIS — Z951 Presence of aortocoronary bypass graft: Secondary | ICD-10-CM | POA: Insufficient documentation

## 2016-02-20 NOTE — Patient Instructions (Signed)
Patient Instructions  Patient Details  Name: Jorge Adams MRN: [id number] Date of Birth: 1951/03/12 Referring Provider:  Adrian Prows, MD  Below are the personal goals you chose as well as exercise and nutrition goals. Our goal is to help you keep on track towards obtaining and maintaining your goals. We will be discussing your progress on these goals with you throughout the program.  Initial Exercise Prescription:     Initial Exercise Prescription - 02/20/16 1400      Date of Initial Exercise RX and Referring Provider   Date 02/20/16   Referring Provider Adrian Prows MD     Treadmill   MPH 2.5   Grade 1.5   Minutes 15   METs 3.43     NuStep   Level 3   Minutes 15   METs 2     Arm Ergometer   Level 2   Minutes 15   METs 3     Prescription Details   Frequency (times per week) 2   Duration Progress to 45 minutes of aerobic exercise without signs/symptoms of physical distress     Intensity   THRR 40-80% of Max Heartrate 112-140   Ratings of Perceived Exertion 11-15   Perceived Dyspnea 0-4     Progression   Progression Continue to progress workloads to maintain intensity without signs/symptoms of physical distress.     Resistance Training   Training Prescription Yes   Weight 3 lbs   Reps 10-12      Exercise Goals: Frequency: Be able to perform aerobic exercise three times per week working toward 3-5 days per week.  Intensity: Work with a perceived exertion of 11 (fairly light) - 15 (hard) as tolerated. Follow your new exercise prescription and watch for changes in prescription as you progress with the program. Changes will be reviewed with you when they are made.  Duration: You should be able to do 30 minutes of continuous aerobic exercise in addition to a 5 minute warm-up and a 5 minute cool-down routine.  Nutrition Goals: Your personal nutrition goals will be established when you do your nutrition analysis with the dietician.  The following are nutrition  guidelines to follow: Cholesterol < '200mg'$ /day Sodium < '1500mg'$ /day Fiber: Men over 50 yrs - 30 grams per day  Personal Goals:     Personal Goals and Risk Factors at Admission - 02/20/16 1447      Core Components/Risk Factors/Patient Goals on Admission    Weight Management Weight Maintenance;Yes   Admit Weight 215 lb (97.5 kg)   Goal Weight: Short Term 210 lb (95.3 kg)   Goal Weight: Long Term 200 lb (90.7 kg)   Expected Outcomes Short Term: Continue to assess and modify interventions until short term weight is achieved;Weight Loss: Understanding of general recommendations for a balanced deficit meal plan, which promotes 1-2 lb weight loss per week and includes a negative energy balance of [phone] kcal/d;Long Term: Adherence to nutrition and physical activity/exercise program aimed toward attainment of established weight goal   Diabetes Yes   Intervention Provide education about signs/symptoms and action to take for hypo/hyperglycemia.;Provide education about proper nutrition, including hydration, and aerobic/resistive exercise prescription along with prescribed medications to achieve blood glucose in normal ranges: Fasting glucose 65-99 mg/dL   Expected Outcomes Short Term: Participant verbalizes understanding of the signs/symptoms and immediate care of hyper/hypoglycemia, proper foot care and importance of medication, aerobic/resistive exercise and nutrition plan for blood glucose control.;Long Term: Attainment of HbA1C < 7%.  LAst HbA1C was  6.8%   Hypertension Yes   Intervention Provide education on lifestyle modifcations including regular physical activity/exercise, weight management, moderate sodium restriction and increased consumption of fresh fruit, vegetables, and low fat dairy, alcohol moderation, and smoking cessation.;Monitor prescription use compliance.   Expected Outcomes Short Term: Continued assessment and intervention until BP is < 140/60m HG in hypertensive participants. <  130/825mHG in hypertensive participants with diabetes, heart failure or chronic kidney disease.;Long Term: Maintenance of blood pressure at goal levels.   Lipids Yes   Intervention Provide education and support for participant on nutrition & aerobic/resistive exercise along with prescribed medications to achieve LDL '70mg'$ , HDL >'40mg'$ .   Expected Outcomes Short Term: Participant states understanding of desired cholesterol values and is compliant with medications prescribed. Participant is following exercise prescription and nutrition guidelines.;Long Term: Cholesterol controlled with medications as prescribed, with individualized exercise RX and with personalized nutrition plan. Value goals: LDL < '70mg'$ , HDL > 40 mg.      Tobacco Use Initial Evaluation: History  Smoking Status  . Former Smoker  . Quit date: 12/02/2002  Smokeless Tobacco  . Never Used    Copy of goals given to participant.

## 2016-02-20 NOTE — Progress Notes (Signed)
Cardiac Individual Treatment Plan  Patient Details  Name: Jorge Adams MRN: [id number] Date of Birth: 1950-08-14 Referring Provider:   Flowsheet Row Cardiac Rehab from 02/20/2016 in [facility] Cardiac and Pulmonary Rehab  Referring Provider  Adrian Prows MD      Initial Encounter Date:  Flowsheet Row Cardiac Rehab from 02/20/2016 in [facility] Cardiac and Pulmonary Rehab  Date  02/20/16  Referring Provider  Adrian Prows MD      Visit Diagnosis: S/P CABG x 3  Patient's Home Medications on Admission:  Current Outpatient Prescriptions:  .  amiodarone (PACERONE) 200 MG tablet, Take 1 tablet (200 mg total) by mouth 2 (two) times daily. For 5 days;then take Amiodarone 200 mg daily by mouth thereafter, Disp: 60 tablet, Rfl: 1 .  aspirin EC 81 MG tablet, Take 81 mg by mouth daily., Disp: , Rfl:  .  brimonidine (ALPHAGAN P) 0.1 % SOLN, Place 1 drop into both eyes 2 (two) times daily., Disp: , Rfl:  .  cilostazol (PLETAL) 100 MG tablet, Take 100 mg by mouth 2 (two) times daily., Disp: , Rfl: 3 .  dorzolamide-timolol (COSOPT) 22.3-6.8 MG/ML ophthalmic solution, Place 1 drop into both eyes 2 (two) times daily., Disp: , Rfl:  .  empagliflozin (JARDIANCE) 10 MG TABS tablet, Take 10 mg by mouth daily., Disp: 30 tablet, Rfl: 12 .  fenofibrate (TRICOR) 145 MG tablet, Take 145 mg by mouth daily., Disp: , Rfl:  .  metFORMIN (GLUCOPHAGE) 1000 MG tablet, Take 1 tablet (1,000 mg total) by mouth 2 (two) times daily., Disp: 180 tablet, Rfl: 1 .  metoprolol (LOPRESSOR) 25 MG tablet, Take 0.5 tablets (12.5 mg total) by mouth 2 (two) times daily. (Patient taking differently: Take 25 mg by mouth 2 (two) times daily. ), Disp: 30 tablet, Rfl: 1 .  nitroGLYCERIN (NITROSTAT) 0.4 MG SL tablet, PLACE 1 TABLET UNDER TONGUE EVERY 5 MINUTES AS NEEDED FOR CHEST PAIN., Disp: , Rfl: 2 .  rosuvastatin (CRESTOR) 20 MG tablet, Take 20 mg by mouth daily. , Disp: , Rfl:  .  TRAVATAN Z 0.004 % SOLN ophthalmic solution, Place 1 drop into both  eyes at bedtime., Disp: , Rfl:   Past Medical History: Past Medical History:  Diagnosis Date  . ASHD (arteriosclerotic heart disease)   . Atherosclerosis of native arteries of the extremities with intermittent claudication   . Coronary atherosclerosis of native coronary artery   . Diabetes mellitus    NIDDM  . Dyslipidemia   . ED (erectile dysfunction)   . Full dentures   . Glaucoma associated with ocular disorder, mild stage    bilateral  . Hypertension    under control, has been on med. > 9 yr.  . Impotence of organic origin   . Meniscus tear 05/2012   right knee  . Myocardial infarction ([place]) 10/2002  . Stented coronary artery     Tobacco Use: History  Smoking Status  . Former Smoker  . Quit date: 12/02/2002  Smokeless Tobacco  . Never Used    Labs: Recent Review Flowsheet Data    Labs for ITP Cardiac and Pulmonary Rehab Latest Ref Rng & Units 01/03/2016 01/04/2016 01/05/2016 01/06/2016 01/08/2016   Cholestrol 0 - 200 mg/dL - - - - -   LDLCALC 0 - 99 mg/dL - - - - -   LDLDIRECT mg/dL - - - - -   HDL >39.00 mg/dL - - - - -   Trlycerides <150 mg/dL - - - 230(H) 185(H)   Hemoglobin  A1c 4.8 - 5.6 % - - - - -   PHART 7.350 - 7.450 - - - - -   PCO2ART 35.0 - 45.0 mmHg - - - - -   HCO3 20.0 - 24.0 mEq/L - - - - -   TCO2 0 - 100 mmol/L '25 27 29 '$ - -   ACIDBASEDEF 0.0 - 2.0 mmol/L - - - - -   O2SAT % - - - - -       Exercise Target Goals: Date: 02/20/16  Exercise Program Goal: Individual exercise prescription set with THRR, safety & activity barriers. Participant demonstrates ability to understand and report RPE using BORG scale, to self-measure pulse accurately, and to acknowledge the importance of the exercise prescription.  Exercise Prescription Goal: Starting with aerobic activity 30 plus minutes a day, 3 days per week for initial exercise prescription. Provide home exercise prescription and guidelines that participant acknowledges understanding prior to  discharge.  Activity Barriers & Risk Stratification:     Activity Barriers & Cardiac Risk Stratification - 02/20/16 1452      Activity Barriers & Cardiac Risk Stratification   Activity Barriers Joint Problems;Deconditioning  Hips hurt when walking. Further discussion: Md has evaluated for PAD and may plan stent placement to left leg in future.   Cardiac Risk Stratification High      6 Minute Walk:     6 Minute Walk    Row Name 02/20/16 1450         6 Minute Walk   Phase Initial     Distance 1530 feet     Walk Time 6 minutes     # of Rest Breaks 0     MPH 2.9     METS 3.44     RPE 11     VO2 Peak 12.04     Symptoms Yes (comment)     Comments hip pain 4/10     Resting HR 84 bpm     Resting BP 126/64     Max Ex. HR 106 bpm     Max Ex. BP 132/72     2 Minute Post BP 130/70        Initial Exercise Prescription:     Initial Exercise Prescription - 02/20/16 1400      Date of Initial Exercise RX and Referring Provider   Date 02/20/16   Referring Provider Adrian Prows MD     Treadmill   MPH 2.5   Grade 1.5   Minutes 15   METs 3.43     NuStep   Level 3   Minutes 15   METs 2     Arm Ergometer   Level 2   Minutes 15   METs 3     Prescription Details   Frequency (times per week) 2   Duration Progress to 45 minutes of aerobic exercise without signs/symptoms of physical distress     Intensity   THRR 40-80% of Max Heartrate 112-140   Ratings of Perceived Exertion 11-15   Perceived Dyspnea 0-4     Progression   Progression Continue to progress workloads to maintain intensity without signs/symptoms of physical distress.     Resistance Training   Training Prescription Yes   Weight 3 lbs   Reps 10-12      Perform Capillary Blood Glucose checks as needed.  Exercise Prescription Changes:     Exercise Prescription Changes    Row Name 02/20/16 1400  Response to Exercise   Blood Pressure (Admit) 124/64       Blood Pressure (Exercise)  132/74       Blood Pressure (Exit) 130/70       Heart Rate (Admit) 84 bpm       Heart Rate (Exercise) 106 bpm       Heart Rate (Exit) 85 bpm       Rating of Perceived Exertion (Exercise) 11          Exercise Comments:   Discharge Exercise Prescription (Final Exercise Prescription Changes):     Exercise Prescription Changes - 02/20/16 1400      Response to Exercise   Blood Pressure (Admit) 124/64   Blood Pressure (Exercise) 132/74   Blood Pressure (Exit) 130/70   Heart Rate (Admit) 84 bpm   Heart Rate (Exercise) 106 bpm   Heart Rate (Exit) 85 bpm   Rating of Perceived Exertion (Exercise) 11      Nutrition:  Target Goals: Understanding of nutrition guidelines, daily intake of sodium '1500mg'$ , cholesterol '200mg'$ , calories 30% from fat and 7% or less from saturated fats, daily to have 5 or more servings of fruits and vegetables.  Biometrics:     Pre Biometrics - 02/20/16 1457      Pre Biometrics   Height 5' 10.3" (1.786 m)   Weight 215 lb 1.6 oz (97.6 kg)   Waist Circumference 43 inches   Hip Circumference 44 inches   Waist to Hip Ratio 0.98 %   BMI (Calculated) 30.7       Nutrition Therapy Plan and Nutrition Goals:     Nutrition Therapy & Goals - 02/20/16 1447      Intervention Plan   Intervention Prescribe, educate and counsel regarding individualized specific dietary modifications aiming towards targeted core components such as weight, hypertension, lipid management, diabetes, heart failure and other comorbidities.   Expected Outcomes Short Term Goal: Understand basic principles of dietary content, such as calories, fat, sodium, cholesterol and nutrients.;Short Term Goal: A plan has been developed with personal nutrition goals set during dietitian appointment.;Long Term Goal: Adherence to prescribed nutrition plan.      Nutrition Discharge: Rate Your Plate Scores:   Nutrition Goals Re-Evaluation:   Psychosocial: Target Goals: Acknowledge presence or  absence of depression, maximize coping skills, provide positive support system. Participant is able to verbalize types and ability to use techniques and skills needed for reducing stress and depression.  Initial Review & Psychosocial Screening:     Initial Psych Review & Screening - 02/20/16 1450      Initial Review   Current issues with --  none reported     Family Dynamics   Good Support System? Yes  Wife,Children and friends     Barriers   Psychosocial barriers to participate in program There are no identifiable barriers or psychosocial needs.;The patient should benefit from training in stress management and relaxation.     Screening Interventions   Interventions Encouraged to exercise      Quality of Life Scores:     Quality of Life - 02/20/16 1508      Quality of Life Scores   Health/Function Pre 22.9 %   Socioeconomic Pre 30 %   Psych/Spiritual Pre 30 %   Family Pre 27.6 %   GLOBAL Pre 26.51 %      PHQ-9: Recent Review Flowsheet Data    Depression screen [facility] 2/9 02/20/2016 05/29/2012   Decreased Interest 0 0   Down, Depressed, Hopeless 0 0  PHQ - 2 Score 0 0   Altered sleeping 0 -   Tired, decreased energy 0 -   Change in appetite 0 -   Feeling bad or failure about yourself  0 -   Trouble concentrating 0 -   Moving slowly or fidgety/restless 0 -   Suicidal thoughts 0 -   PHQ-9 Score 0 -   Difficult doing work/chores Not difficult at all -      Psychosocial Evaluation and Intervention:   Psychosocial Re-Evaluation:   Vocational Rehabilitation: Provide vocational rehab assistance to qualifying candidates.   Vocational Rehab Evaluation & Intervention:     Vocational Rehab - 02/20/16 1454      Initial Vocational Rehab Evaluation & Intervention   Assessment shows need for Vocational Rehabilitation No      Education: Education Goals: Education classes will be provided on a weekly basis, covering required topics. Participant will state  understanding/return demonstration of topics presented.  Learning Barriers/Preferences:     Learning Barriers/Preferences - 02/20/16 1454      Learning Barriers/Preferences   Learning Barriers None   Learning Preferences None      Education Topics: General Nutrition Guidelines/Fats and Fiber: -Group instruction provided by verbal, written material, models and posters to present the general guidelines for heart healthy nutrition. Gives an explanation and review of dietary fats and fiber.   Controlling Sodium/Reading Food Labels: -Group verbal and written material supporting the discussion of sodium use in heart healthy nutrition. Review and explanation with models, verbal and written materials for utilization of the food label.   Exercise Physiology & Risk Factors: - Group verbal and written instruction with models to review the exercise physiology of the cardiovascular system and associated critical values. Details cardiovascular disease risk factors and the goals associated with each risk factor.   Aerobic Exercise & Resistance Training: - Gives group verbal and written discussion on the health impact of inactivity. On the components of aerobic and resistive training programs and the benefits of this training and how to safely progress through these programs.   Flexibility, Balance, General Exercise Guidelines: - Provides group verbal and written instruction on the benefits of flexibility and balance training programs. Provides general exercise guidelines with specific guidelines to those with heart or lung disease. Demonstration and skill practice provided.   Stress Management: - Provides group verbal and written instruction about the health risks of elevated stress, cause of high stress, and healthy ways to reduce stress.   Depression: - Provides group verbal and written instruction on the correlation between heart/lung disease and depressed mood, treatment options, and the  stigmas associated with seeking treatment.   Anatomy & Physiology of the Heart: - Group verbal and written instruction and models provide basic cardiac anatomy and physiology, with the coronary electrical and arterial systems. Review of: AMI, Angina, Valve disease, Heart Failure, Cardiac Arrhythmia, Pacemakers, and the ICD.   Cardiac Procedures: - Group verbal and written instruction and models to describe the testing methods done to diagnose heart disease. Reviews the outcomes of the test results. Describes the treatment choices: Medical Management, Angioplasty, or Coronary Bypass Surgery.   Cardiac Medications: - Group verbal and written instruction to review commonly prescribed medications for heart disease. Reviews the medication, class of the drug, and side effects. Includes the steps to properly store meds and maintain the prescription regimen.   Go Sex-Intimacy & Heart Disease, Get SMART - Goal Setting: - Group verbal and written instruction through game format to discuss heart disease and  the return to sexual intimacy. Provides group verbal and written material to discuss and apply goal setting through the application of the S.M.A.R.T. Method.   Other Matters of the Heart: - Provides group verbal, written materials and models to describe Heart Failure, Angina, Valve Disease, and Diabetes in the realm of heart disease. Includes description of the disease process and treatment options available to the cardiac patient.   Exercise & Equipment Safety: - Individual verbal instruction and demonstration of equipment use and safety with use of the equipment. Flowsheet Row Cardiac Rehab from 02/20/2016 in [facility] Cardiac and Pulmonary Rehab  Date  02/20/16  Educator  SB  Instruction Review Code  2- meets goals/outcomes      Infection Prevention: - Provides verbal and written material to individual with discussion of infection control including proper hand washing and proper equipment  cleaning during exercise session. Flowsheet Row Cardiac Rehab from 02/20/2016 in [facility] Cardiac and Pulmonary Rehab  Date  02/20/16  Educator  SB  Instruction Review Code  2- meets goals/outcomes      Falls Prevention: - Provides verbal and written material to individual with discussion of falls prevention and safety.   Diabetes: - Individual verbal and written instruction to review signs/symptoms of diabetes, desired ranges of glucose level fasting, after meals and with exercise. Advice that pre and post exercise glucose checks will be done for 3 sessions at entry of program. [place] from 02/20/2016 in [facility] Cardiac and Pulmonary Rehab  Date  02/20/16  Educator  SB  Instruction Review Code  2- meets goals/outcomes       Knowledge Questionnaire Score:     Knowledge Questionnaire Score - 02/20/16 1454      Knowledge Questionnaire Score   Pre Score 25/28      Core Components/Risk Factors/Patient Goals at Admission:     Personal Goals and Risk Factors at Admission - 02/20/16 1447      Core Components/Risk Factors/Patient Goals on Admission    Weight Management Weight Maintenance;Yes   Admit Weight 215 lb (97.5 kg)   Goal Weight: Short Term 210 lb (95.3 kg)   Goal Weight: Long Term 200 lb (90.7 kg)   Expected Outcomes Short Term: Continue to assess and modify interventions until short term weight is achieved;Weight Loss: Understanding of general recommendations for a balanced deficit meal plan, which promotes 1-2 lb weight loss per week and includes a negative energy balance of [phone] kcal/d;Long Term: Adherence to nutrition and physical activity/exercise program aimed toward attainment of established weight goal   Diabetes Yes   Intervention Provide education about signs/symptoms and action to take for hypo/hyperglycemia.;Provide education about proper nutrition, including hydration, and aerobic/resistive exercise prescription along with prescribed medications  to achieve blood glucose in normal ranges: Fasting glucose 65-99 mg/dL   Expected Outcomes Short Term: Participant verbalizes understanding of the signs/symptoms and immediate care of hyper/hypoglycemia, proper foot care and importance of medication, aerobic/resistive exercise and nutrition plan for blood glucose control.;Long Term: Attainment of HbA1C < 7%.  LAst HbA1C was 6.8%   Hypertension Yes   Intervention Provide education on lifestyle modifcations including regular physical activity/exercise, weight management, moderate sodium restriction and increased consumption of fresh fruit, vegetables, and low fat dairy, alcohol moderation, and smoking cessation.;Monitor prescription use compliance.   Expected Outcomes Short Term: Continued assessment and intervention until BP is < 140/22m HG in hypertensive participants. < 130/848mHG in hypertensive participants with diabetes, heart failure or chronic kidney disease.;Long Term: Maintenance of blood pressure  at goal levels.   Lipids Yes   Intervention Provide education and support for participant on nutrition & aerobic/resistive exercise along with prescribed medications to achieve LDL '70mg'$ , HDL >'40mg'$ .   Expected Outcomes Short Term: Participant states understanding of desired cholesterol values and is compliant with medications prescribed. Participant is following exercise prescription and nutrition guidelines.;Long Term: Cholesterol controlled with medications as prescribed, with individualized exercise RX and with personalized nutrition plan. Value goals: LDL < '70mg'$ , HDL > 40 mg.      Core Components/Risk Factors/Patient Goals Review:    Core Components/Risk Factors/Patient Goals at Discharge (Final Review):    ITP Comments:     ITP Comments    Row Name 02/20/16 1425           ITP Comments Medical review completed. ITP created. COntinue with ITP   Documentation od asmission diagnosis can be found [facility] 01/11/2016 Discharge Summary           Comments:

## 2016-02-26 ENCOUNTER — Other Ambulatory Visit: Payer: Self-pay | Admitting: Family Medicine

## 2016-02-26 DIAGNOSIS — E1159 Type 2 diabetes mellitus with other circulatory complications: Secondary | ICD-10-CM

## 2016-02-27 ENCOUNTER — Encounter: Payer: 59 | Admitting: *Deleted

## 2016-02-27 ENCOUNTER — Encounter: Payer: Self-pay | Admitting: Family Medicine

## 2016-02-27 DIAGNOSIS — Z951 Presence of aortocoronary bypass graft: Secondary | ICD-10-CM

## 2016-02-27 LAB — GLUCOSE, CAPILLARY: GLUCOSE-CAPILLARY: 127 mg/dL — AB (ref 65–99)

## 2016-02-27 NOTE — Progress Notes (Signed)
Daily Session Note  Patient Details  Name: Damin W Loder MRN: [id number] Date of Birth: 02/25/1951 Referring Provider:   Flowsheet Row Cardiac Rehab from 02/20/2016 in [facility] Cardiac and Pulmonary Rehab  Referring Provider  Ganji, Jay MD      Encounter Date: 02/27/2016  Check In:     Session Check In - 02/27/16 0800      Check-In   Location [facility]-Cardiac & Pulmonary Rehab   Staff Present Susanne Bice, RN, BSN, CCRP;Jessica Hawkins, MA, ACSM RCEP, Exercise Physiologist;Diane Wright, RN, BSN   Supervising physician immediately available to respond to emergencies See telemetry face sheet for immediately available ER MD   Medication changes reported     No   Fall or balance concerns reported    No   Warm-up and Cool-down Performed on first and last piece of equipment   Resistance Training Performed Yes   VAD Patient? No     Pain Assessment   Currently in Pain? No/denies         Goals Met:  Exercise tolerated well No report of cardiac concerns or symptoms Strength training completed today  Goals Unmet:  Not Applicable  Comments: Doing well with exercise prescription progression.    Dr. Mark Miller is Medical Director for [facility] and [facility]. 

## 2016-02-29 ENCOUNTER — Encounter: Payer: 59 | Admitting: *Deleted

## 2016-02-29 DIAGNOSIS — Z951 Presence of aortocoronary bypass graft: Secondary | ICD-10-CM

## 2016-02-29 LAB — GLUCOSE, CAPILLARY: Glucose-Capillary: 104 mg/dL — ABNORMAL HIGH (ref 65–99)

## 2016-02-29 NOTE — Progress Notes (Signed)
Daily Session Note  Patient Details  Name: Jorge Adams MRN: [id number] Date of Birth: [birth year] Referring Provider:   Flowsheet Row Cardiac Rehab from 02/20/2016 in [facility] Cardiac and Pulmonary Rehab  Referring Provider  Adrian Prows MD      Encounter Date: 02/29/2016  Check In:     Session Check In - 02/29/16 0838      Check-In   Location [facility]-Cardiac & Pulmonary Rehab   Staff Present Alberteen Sam, MA, ACSM RCEP, Exercise Physiologist;Amanda Oletta Darter, BA, ACSM CEP, Exercise Physiologist;Carroll Enterkin, RN, BSN   Supervising physician immediately available to respond to emergencies See telemetry face sheet for immediately available ER MD   Medication changes reported     No   Fall or balance concerns reported    No   Warm-up and Cool-down Performed on first and last piece of equipment   Resistance Training Performed Yes   VAD Patient? No     Pain Assessment   Currently in Pain? No/denies   Multiple Pain Sites No         Goals Met:  Independence with exercise equipment Exercise tolerated well No report of cardiac concerns or symptoms Strength training completed today  Goals Unmet:  Not Applicable  Comments: Pt able to follow exercise prescription today without complaint.  Will continue to monitor for progression.    Dr. Emily Filbert is Medical Director for [place] and [facility].

## 2016-03-01 ENCOUNTER — Other Ambulatory Visit (INDEPENDENT_AMBULATORY_CARE_PROVIDER_SITE_OTHER): Payer: 59

## 2016-03-01 DIAGNOSIS — E1159 Type 2 diabetes mellitus with other circulatory complications: Secondary | ICD-10-CM | POA: Diagnosis not present

## 2016-03-01 LAB — LIPID PANEL
CHOLESTEROL: 130 mg/dL (ref 0–200)
HDL: 46.1 mg/dL (ref >39.00)
LDL CALC: 46 mg/dL (ref 0–99)
NonHDL: 83.41
TRIGLYCERIDES: 187 mg/dL — AB (ref 0.0–149.0)
Total CHOL/HDL Ratio: 3
VLDL: 37.4 mg/dL (ref 0.0–40.0)

## 2016-03-01 LAB — COMPREHENSIVE METABOLIC PANEL
ALBUMIN: 4 g/dL (ref 3.5–5.2)
ALK PHOS: 53 U/L (ref 39–117)
ALT: 12 U/L (ref 0–53)
AST: 12 U/L (ref 0–37)
BUN: 10 mg/dL (ref 6–23)
CHLORIDE: 105 meq/L (ref 96–112)
CO2: 27 mEq/L (ref 19–32)
Calcium: 9.9 mg/dL (ref 8.4–10.5)
Creatinine, Ser: 0.8 mg/dL (ref 0.40–1.50)
GFR: 102.57 mL/min (ref >60.00)
Glucose, Bld: 106 mg/dL — ABNORMAL HIGH (ref 70–99)
POTASSIUM: 4.7 meq/L (ref 3.5–5.1)
SODIUM: 138 meq/L (ref 135–145)
Total Bilirubin: 0.4 mg/dL (ref 0.2–1.2)
Total Protein: 7 g/dL (ref 6.0–8.3)

## 2016-03-01 LAB — HEMOGLOBIN A1C: HEMOGLOBIN A1C: 5.9 % (ref 4.6–6.5)

## 2016-03-05 ENCOUNTER — Ambulatory Visit (INDEPENDENT_AMBULATORY_CARE_PROVIDER_SITE_OTHER): Payer: 59 | Admitting: Family Medicine

## 2016-03-05 ENCOUNTER — Encounter: Payer: Self-pay | Admitting: Family Medicine

## 2016-03-05 DIAGNOSIS — K429 Umbilical hernia without obstruction or gangrene: Secondary | ICD-10-CM | POA: Insufficient documentation

## 2016-03-05 DIAGNOSIS — R0683 Snoring: Secondary | ICD-10-CM

## 2016-03-05 DIAGNOSIS — E1159 Type 2 diabetes mellitus with other circulatory complications: Secondary | ICD-10-CM

## 2016-03-05 DIAGNOSIS — I2510 Atherosclerotic heart disease of native coronary artery without angina pectoris: Secondary | ICD-10-CM | POA: Diagnosis not present

## 2016-03-05 NOTE — Patient Instructions (Addendum)
Ask your wife if you are still snoring a lot or stopping breathing.  If so, then notify me.   Ask cardiology about going for the umbilical hernia repair.   Recheck in about 3 months, labs ahead of time.   Take care.  Glad to see you.  Update me as needed.

## 2016-03-05 NOTE — Assessment & Plan Note (Signed)
A1c at goal, no lows continue as is.   Recheck in about 3 months. Working on diet and exercise, labs d/w pt.

## 2016-03-05 NOTE — Assessment & Plan Note (Signed)
He'll check with cards about when he can have repair considered.  >25 minutes spent in face to face time with patient, >50% spent in counselling or coordination of care.

## 2016-03-05 NOTE — Assessment & Plan Note (Signed)
He'll check to see if still snoring much.  Neck size down 1.5" and he feels better with weight loss.  He may not need OSA eval at this point.  D/w pt.  He'll update me.

## 2016-03-05 NOTE — Progress Notes (Signed)
CAD. Chest wall is sore from sternotomy, with a cough, but not having CP o/w.  In cardiac rehab twice a week.  He can walk about 20 minutes on the treadmill.  [place].  He has been released by [facility] w/cards f/u in 05/2016.    D/w patient that he may still need eval for OSA, but with sig weight loss noted via diet and exercise.  Sleeping well.  Snoring is clearly better than prev.  Prev collar size 18", now 16.5" on measure today.    Diabetes:  Using medications without difficulties:yes Hypoglycemic episodes:no Hyperglycemic episodes:no Feet problems: no Blood Sugars averaging: 98 today, usually a little higher, 115-122 in the AM.   eye exam within last year:yes A1c improved.    Colonoscopy 2012, would normally be due for f/u, d/w pt. Deferred given her cardiac hx for now.  He agrees.    PMH and SH reviewed  Meds, vitals, and allergies reviewed.   ROS: Per HPI unless specifically indicated in ROS section   GEN: nad, alert and oriented HEENT: mucous membranes moist NECK: supple w/o LA CV: rrr. PULM: ctab, no inc wob ABD: soft, +bs EXT: no edema SKIN: no acute rash but umbilical hernia noted.    Diabetic foot exam: Normal inspection except for bruise on dorsum R foot where he had dropped an shower caddy on his foot. No skin breakdown No calluses  Normal DP pulses Normal sensation to light touch and monofilament Nails normal

## 2016-03-05 NOTE — Assessment & Plan Note (Signed)
No CP, sternotomy pain improving, continue current meds and cardiac rehab.  Doing well.  He agrees.  He has cards f/u pending for later this fall.

## 2016-03-05 NOTE — Progress Notes (Signed)
Pre visit review using our clinic review tool, if applicable. No additional management support is needed unless otherwise documented below in the visit note. 

## 2016-03-06 ENCOUNTER — Encounter: Payer: Self-pay | Admitting: *Deleted

## 2016-03-06 DIAGNOSIS — Z951 Presence of aortocoronary bypass graft: Secondary | ICD-10-CM

## 2016-03-06 NOTE — Progress Notes (Signed)
Cardiac Individual Treatment Plan  Patient Details  Name: Jorge Adams MRN: [id number] Date of Birth: 1951/01/24 Referring Provider:   Flowsheet Row Cardiac Rehab from 02/20/2016 in [facility] Cardiac and Pulmonary Rehab  Referring Provider  Adrian Prows MD      Initial Encounter Date:  Flowsheet Row Cardiac Rehab from 02/20/2016 in [facility] Cardiac and Pulmonary Rehab  Date  02/20/16  Referring Provider  Adrian Prows MD      Visit Diagnosis: S/P CABG x 3  Patient's Home Medications on Admission:  Current Outpatient Prescriptions:  .  aspirin EC 81 MG tablet, Take 81 mg by mouth daily., Disp: , Rfl:  .  brimonidine (ALPHAGAN P) 0.1 % SOLN, Place 1 drop into both eyes 2 (two) times daily., Disp: , Rfl:  .  cilostazol (PLETAL) 100 MG tablet, Take 100 mg by mouth 2 (two) times daily., Disp: , Rfl: 3 .  dorzolamide-timolol (COSOPT) 22.3-6.8 MG/ML ophthalmic solution, Place 1 drop into both eyes 2 (two) times daily., Disp: , Rfl:  .  empagliflozin (JARDIANCE) 10 MG TABS tablet, Take 10 mg by mouth daily., Disp: 30 tablet, Rfl: 12 .  fenofibrate (TRICOR) 145 MG tablet, Take 145 mg by mouth daily., Disp: , Rfl:  .  metFORMIN (GLUCOPHAGE) 1000 MG tablet, Take 1 tablet (1,000 mg total) by mouth 2 (two) times daily., Disp: 180 tablet, Rfl: 1 .  metoprolol tartrate (LOPRESSOR) 25 MG tablet, Take 25 mg by mouth 2 (two) times daily., Disp: , Rfl:  .  nitroGLYCERIN (NITROSTAT) 0.4 MG SL tablet, PLACE 1 TABLET UNDER TONGUE EVERY 5 MINUTES AS NEEDED FOR CHEST PAIN., Disp: , Rfl: 2 .  rosuvastatin (CRESTOR) 20 MG tablet, Take 20 mg by mouth daily. , Disp: , Rfl:  .  TRAVATAN Z 0.004 % SOLN ophthalmic solution, Place 1 drop into both eyes at bedtime., Disp: , Rfl:   Past Medical History: Past Medical History:  Diagnosis Date  . ASHD (arteriosclerotic heart disease)   . Atherosclerosis of native arteries of the extremities with intermittent claudication   . Coronary atherosclerosis of native coronary artery    . Diabetes mellitus    NIDDM  . Dyslipidemia   . ED (erectile dysfunction)   . Full dentures   . Glaucoma associated with ocular disorder, mild stage    bilateral  . Hypertension    under control, has been on med. > 9 yr.  . Impotence of organic origin   . Meniscus tear 05/2012   right knee  . Myocardial infarction ([place]) 10/2002  . Stented coronary artery     Tobacco Use: History  Smoking Status  . Former Smoker  . Quit date: 12/02/2002  Smokeless Tobacco  . Never Used    Labs: Recent Review Flowsheet Data    Labs for ITP Cardiac and Pulmonary Rehab Latest Ref Rng & Units 01/04/2016 01/05/2016 01/06/2016 01/08/2016 03/01/2016   Cholestrol 0 - 200 mg/dL - - - - 130   LDLCALC 0 - 99 mg/dL - - - - 46   LDLDIRECT mg/dL - - - - -   HDL >39.00 mg/dL - - - - 46.10   Trlycerides 0.0 - 149.0 mg/dL - - 230(H) 185(H) 187.0(H)   Hemoglobin A1c 4.6 - 6.5 % - - - - 5.9   PHART 7.350 - 7.450 - - - - -   PCO2ART 35.0 - 45.0 mmHg - - - - -   HCO3 20.0 - 24.0 mEq/L - - - - -   TCO2 0 -  100 mmol/L 27 29 - - -   ACIDBASEDEF 0.0 - 2.0 mmol/L - - - - -   O2SAT % - - - - -       Exercise Target Goals:    Exercise Program Goal: Individual exercise prescription set with THRR, safety & activity barriers. Participant demonstrates ability to understand and report RPE using BORG scale, to self-measure pulse accurately, and to acknowledge the importance of the exercise prescription.  Exercise Prescription Goal: Starting with aerobic activity 30 plus minutes a day, 3 days per week for initial exercise prescription. Provide home exercise prescription and guidelines that participant acknowledges understanding prior to discharge.  Activity Barriers & Risk Stratification:     Activity Barriers & Cardiac Risk Stratification - 02/20/16 1452      Activity Barriers & Cardiac Risk Stratification   Activity Barriers Joint Problems;Deconditioning  Hips hurt when walking. Further discussion: Md has  evaluated for PAD and may plan stent placement to left leg in future.   Cardiac Risk Stratification High      6 Minute Walk:     6 Minute Walk    Row Name 02/20/16 1450         6 Minute Walk   Phase Initial     Distance 1530 feet     Walk Time 6 minutes     # of Rest Breaks 0     MPH 2.9     METS 3.44     RPE 11     VO2 Peak 12.04     Symptoms Yes (comment)     Comments hip pain 4/10     Resting HR 84 bpm     Resting BP 126/64     Max Ex. HR 106 bpm     Max Ex. BP 132/72     2 Minute Post BP 130/70        Initial Exercise Prescription:     Initial Exercise Prescription - 02/20/16 1400      Date of Initial Exercise RX and Referring Provider   Date 02/20/16   Referring Provider Adrian Prows MD     Treadmill   MPH 2.5   Grade 1.5   Minutes 15   METs 3.43     NuStep   Level 3   Minutes 15   METs 2     Arm Ergometer   Level 2   Minutes 15   METs 3     Prescription Details   Frequency (times per week) 2   Duration Progress to 45 minutes of aerobic exercise without signs/symptoms of physical distress     Intensity   THRR 40-80% of Max Heartrate 112-140   Ratings of Perceived Exertion 11-15   Perceived Dyspnea 0-4     Progression   Progression Continue to progress workloads to maintain intensity without signs/symptoms of physical distress.     Resistance Training   Training Prescription Yes   Weight 3 lbs   Reps 10-12      Perform Capillary Blood Glucose checks as needed.  Exercise Prescription Changes:     Exercise Prescription Changes    Row Name 02/20/16 1400 02/29/16 1400           Exercise Review   Progression  - Yes        Response to Exercise   Blood Pressure (Admit) 124/64 134/70      Blood Pressure (Exercise) 132/74 140/80      Blood Pressure (Exit) 130/70 134/70  Heart Rate (Admit) 84 bpm 66 bpm      Heart Rate (Exercise) 106 bpm 130 bpm      Heart Rate (Exit) 85 bpm 83 bpm      Rating of Perceived Exertion  (Exercise) 11 11      Symptoms  - none      Duration  - Progress to 45 minutes of aerobic exercise without signs/symptoms of physical distress      Intensity  - THRR unchanged        Progression   Progression  - Continue to progress workloads to maintain intensity without signs/symptoms of physical distress.      Average METs  - 2.6        Resistance Training   Training Prescription  - Yes      Weight  - 3 lbs      Reps  - 10-12        Interval Training   Interval Training  - No        Treadmill   MPH  - 2      Grade  - 1      Minutes  - 15      METs  - 2.81        NuStep   Level  - 3      Minutes  - 15      METs  - 2.6        Arm Ergometer   Level  - 2      Minutes  - 15      METs  - 2.4         Exercise Comments:     Exercise Comments    Row Name 02/29/16 1448           Exercise Comments [name]'s first day of exercise was on 02/27/16.  He is off to a good start with exercise.  We will continue to monitor for progression.          Discharge Exercise Prescription (Final Exercise Prescription Changes):     Exercise Prescription Changes - 02/29/16 1400      Exercise Review   Progression Yes     Response to Exercise   Blood Pressure (Admit) 134/70   Blood Pressure (Exercise) 140/80   Blood Pressure (Exit) 134/70   Heart Rate (Admit) 66 bpm   Heart Rate (Exercise) 130 bpm   Heart Rate (Exit) 83 bpm   Rating of Perceived Exertion (Exercise) 11   Symptoms none   Duration Progress to 45 minutes of aerobic exercise without signs/symptoms of physical distress   Intensity THRR unchanged     Progression   Progression Continue to progress workloads to maintain intensity without signs/symptoms of physical distress.   Average METs 2.6     Resistance Training   Training Prescription Yes   Weight 3 lbs   Reps 10-12     Interval Training   Interval Training No     Treadmill   MPH 2   Grade 1   Minutes 15   METs 2.81     NuStep   Level 3   Minutes 15    METs 2.6     Arm Ergometer   Level 2   Minutes 15   METs 2.4      Nutrition:  Target Goals: Understanding of nutrition guidelines, daily intake of sodium <1534m, cholesterol <2035m calories 30% from fat and 7% or less from saturated fats, daily to have 5 or  more servings of fruits and vegetables.  Biometrics:     Pre Biometrics - 02/20/16 1457      Pre Biometrics   Height 5' 10.3" (1.786 m)   Weight 215 lb 1.6 oz (97.6 kg)   Waist Circumference 43 inches   Hip Circumference 44 inches   Waist to Hip Ratio 0.98 %   BMI (Calculated) 30.7       Nutrition Therapy Plan and Nutrition Goals:     Nutrition Therapy & Goals - 02/20/16 1447      Intervention Plan   Intervention Prescribe, educate and counsel regarding individualized specific dietary modifications aiming towards targeted core components such as weight, hypertension, lipid management, diabetes, heart failure and other comorbidities.   Expected Outcomes Short Term Goal: Understand basic principles of dietary content, such as calories, fat, sodium, cholesterol and nutrients.;Short Term Goal: A plan has been developed with personal nutrition goals set during dietitian appointment.;Long Term Goal: Adherence to prescribed nutrition plan.      Nutrition Discharge: Rate Your Plate Scores:     Nutrition Assessments - 03/04/16 1535      Rate Your Plate Scores   Pre Score 78   Pre Score % 86.6 %      Nutrition Goals Re-Evaluation:   Psychosocial: Target Goals: Acknowledge presence or absence of depression, maximize coping skills, provide positive support system. Participant is able to verbalize types and ability to use techniques and skills needed for reducing stress and depression.  Initial Review & Psychosocial Screening:     Initial Psych Review & Screening - 02/20/16 1450      Initial Review   Current issues with --  none reported     Family Dynamics   Good Support System? Yes  Wife,Children and  friends     Barriers   Psychosocial barriers to participate in program There are no identifiable barriers or psychosocial needs.;The patient should benefit from training in stress management and relaxation.     Screening Interventions   Interventions Encouraged to exercise      Quality of Life Scores:     Quality of Life - 02/20/16 1508      Quality of Life Scores   Health/Function Pre 22.9 %   Socioeconomic Pre 30 %   Psych/Spiritual Pre 30 %   Family Pre 27.6 %   GLOBAL Pre 26.51 %      PHQ-9: Recent Review Flowsheet Data    Depression screen [facility] 2/9 02/20/2016 05/29/2012   Decreased Interest 0 0   Down, Depressed, Hopeless 0 0   PHQ - 2 Score 0 0   Altered sleeping 0 -   Tired, decreased energy 0 -   Change in appetite 0 -   Feeling bad or failure about yourself  0 -   Trouble concentrating 0 -   Moving slowly or fidgety/restless 0 -   Suicidal thoughts 0 -   PHQ-9 Score 0 -   Difficult doing work/chores Not difficult at all -      Psychosocial Evaluation and Intervention:     Psychosocial Evaluation - 02/27/16 1006      Psychosocial Evaluation & Interventions   Interventions Encouraged to exercise with the program and follow exercise prescription   Comments Counselor met with Mr. Gossman today for initial psychosocial evaluation.  He is a 65 year old who had triple bypass surgery on 6/13. He had a  previous surgery with a stent inserted in 2004 as well.  Mr. Lemmie Evens has a strong support  system with a spouse of 5 years and several adult children who live close by as well as active involvement in his local church.  Mr. Lemmie Evens has diabetes in addition to heart disease.  He states he sleeps well and has a good appetite.  He denies a history of depression or anxiety or any current symptoms.  His mood is reportedly positive most of the time and he has minimal stress in his life, other than his health.  Mr. Lemmie Evens has goals to lose weight; increase his energy; stamina; and strength while  in this program.        Psychosocial Re-Evaluation:   Vocational Rehabilitation: Provide vocational rehab assistance to qualifying candidates.   Vocational Rehab Evaluation & Intervention:     Vocational Rehab - 02/20/16 1454      Initial Vocational Rehab Evaluation & Intervention   Assessment shows need for Vocational Rehabilitation No      Education: Education Goals: Education classes will be provided on a weekly basis, covering required topics. Participant will state understanding/return demonstration of topics presented.  Learning Barriers/Preferences:     Learning Barriers/Preferences - 02/20/16 1454      Learning Barriers/Preferences   Learning Barriers None   Learning Preferences None      Education Topics: General Nutrition Guidelines/Fats and Fiber: -Group instruction provided by verbal, written material, models and posters to present the general guidelines for heart healthy nutrition. Gives an explanation and review of dietary fats and fiber.   Controlling Sodium/Reading Food Labels: -Group verbal and written material supporting the discussion of sodium use in heart healthy nutrition. Review and explanation with models, verbal and written materials for utilization of the food label.   Exercise Physiology & Risk Factors: - Group verbal and written instruction with models to review the exercise physiology of the cardiovascular system and associated critical values. Details cardiovascular disease risk factors and the goals associated with each risk factor. Flowsheet Row Cardiac Rehab from 02/29/2016 in [facility] Cardiac and Pulmonary Rehab  Date  02/27/16  Educator  [facility]  Instruction Review Code  2- meets goals/outcomes      Aerobic Exercise & Resistance Training: - Gives group verbal and written discussion on the health impact of inactivity. On the components of aerobic and resistive training programs and the benefits of this training and how to safely progress  through these programs. Flowsheet Row Cardiac Rehab from 02/29/2016 in [facility] Cardiac and Pulmonary Rehab  Date  02/29/16  Educator  AS  Instruction Review Code  2- meets goals/outcomes      Flexibility, Balance, General Exercise Guidelines: - Provides group verbal and written instruction on the benefits of flexibility and balance training programs. Provides general exercise guidelines with specific guidelines to those with heart or lung disease. Demonstration and skill practice provided.   Stress Management: - Provides group verbal and written instruction about the health risks of elevated stress, cause of high stress, and healthy ways to reduce stress.   Depression: - Provides group verbal and written instruction on the correlation between heart/lung disease and depressed mood, treatment options, and the stigmas associated with seeking treatment.   Anatomy & Physiology of the Heart: - Group verbal and written instruction and models provide basic cardiac anatomy and physiology, with the coronary electrical and arterial systems. Review of: AMI, Angina, Valve disease, Heart Failure, Cardiac Arrhythmia, Pacemakers, and the ICD.   Cardiac Procedures: - Group verbal and written instruction and models to describe the testing methods done to diagnose heart disease. Reviews  the outcomes of the test results. Describes the treatment choices: Medical Management, Angioplasty, or Coronary Bypass Surgery.   Cardiac Medications: - Group verbal and written instruction to review commonly prescribed medications for heart disease. Reviews the medication, class of the drug, and side effects. Includes the steps to properly store meds and maintain the prescription regimen.   Go Sex-Intimacy & Heart Disease, Get SMART - Goal Setting: - Group verbal and written instruction through game format to discuss heart disease and the return to sexual intimacy. Provides group verbal and written material to discuss and  apply goal setting through the application of the S.M.A.R.T. Method.   Other Matters of the Heart: - Provides group verbal, written materials and models to describe Heart Failure, Angina, Valve Disease, and Diabetes in the realm of heart disease. Includes description of the disease process and treatment options available to the cardiac patient.   Exercise & Equipment Safety: - Individual verbal instruction and demonstration of equipment use and safety with use of the equipment. Flowsheet Row Cardiac Rehab from 02/29/2016 in [facility] Cardiac and Pulmonary Rehab  Date  02/20/16  Educator  SB  Instruction Review Code  2- meets goals/outcomes      Infection Prevention: - Provides verbal and written material to individual with discussion of infection control including proper hand washing and proper equipment cleaning during exercise session. Flowsheet Row Cardiac Rehab from 02/29/2016 in [facility] Cardiac and Pulmonary Rehab  Date  02/20/16  Educator  SB  Instruction Review Code  2- meets goals/outcomes      Falls Prevention: - Provides verbal and written material to individual with discussion of falls prevention and safety.   Diabetes: - Individual verbal and written instruction to review signs/symptoms of diabetes, desired ranges of glucose level fasting, after meals and with exercise. Advice that pre and post exercise glucose checks will be done for 3 sessions at entry of program. [place] from 02/29/2016 in [facility] Cardiac and Pulmonary Rehab  Date  02/20/16  Educator  SB  Instruction Review Code  2- meets goals/outcomes       Knowledge Questionnaire Score:     Knowledge Questionnaire Score - 02/20/16 1454      Knowledge Questionnaire Score   Pre Score 25/28      Core Components/Risk Factors/Patient Goals at Admission:     Personal Goals and Risk Factors at Admission - 02/20/16 1447      Core Components/Risk Factors/Patient Goals on Admission    Weight  Management Weight Maintenance;Yes   Admit Weight 215 lb (97.5 kg)   Goal Weight: Short Term 210 lb (95.3 kg)   Goal Weight: Long Term 200 lb (90.7 kg)   Expected Outcomes Short Term: Continue to assess and modify interventions until short term weight is achieved;Weight Loss: Understanding of general recommendations for a balanced deficit meal plan, which promotes 1-2 lb weight loss per week and includes a negative energy balance of [phone] kcal/d;Long Term: Adherence to nutrition and physical activity/exercise program aimed toward attainment of established weight goal   Diabetes Yes   Intervention Provide education about signs/symptoms and action to take for hypo/hyperglycemia.;Provide education about proper nutrition, including hydration, and aerobic/resistive exercise prescription along with prescribed medications to achieve blood glucose in normal ranges: Fasting glucose 65-99 mg/dL   Expected Outcomes Short Term: Participant verbalizes understanding of the signs/symptoms and immediate care of hyper/hypoglycemia, proper foot care and importance of medication, aerobic/resistive exercise and nutrition plan for blood glucose control.;Long Term: Attainment of HbA1C < 7%.  LAst HbA1C was 6.8%   Hypertension Yes   Intervention Provide education on lifestyle modifcations including regular physical activity/exercise, weight management, moderate sodium restriction and increased consumption of fresh fruit, vegetables, and low fat dairy, alcohol moderation, and smoking cessation.;Monitor prescription use compliance.   Expected Outcomes Short Term: Continued assessment and intervention until BP is < 140/43m HG in hypertensive participants. < 130/844mHG in hypertensive participants with diabetes, heart failure or chronic kidney disease.;Long Term: Maintenance of blood pressure at goal levels.   Lipids Yes   Intervention Provide education and support for participant on nutrition & aerobic/resistive exercise  along with prescribed medications to achieve LDL <7081mHDL >44m51m Expected Outcomes Short Term: Participant states understanding of desired cholesterol values and is compliant with medications prescribed. Participant is following exercise prescription and nutrition guidelines.;Long Term: Cholesterol controlled with medications as prescribed, with individualized exercise RX and with personalized nutrition plan. Value goals: LDL < 70mg64mL > 40 mg.      Core Components/Risk Factors/Patient Goals Review:      Goals and Risk Factor Review    Row Name 02/27/16 1440 02/29/16 0949           Core Components/Risk Factors/Patient Goals Review   Personal Goals Review Weight Management/Obesity;Lipids;Hypertension;Diabetes Weight Management/Obesity;Diabetes;Hypertension;Lipids      Review Reviewed risk factor modification with DouglNathaneil Canaryy.  He is starting program. He has Weight goal of 200 lbs longterm 210 short term. Already stopped sodas and drinking water only. No fried, only grilled or broiled. Will set up RD appointment. Expects blood work ina week fro cholesterol followup. Compliant with medications. [name] Marden Noblen day two today.  He is feeling good with exercise.  His weight was up a 1 lb today.  He is checking his blood sugar and blood pressures at home.  He continues to work on his diet.      Expected Outcomes COntinued compliance with medications, addition of nutriotin plan once meets with the RD and following the exercise program as prescribed to all aid in keeping  risk factors controlled and aid in weight loss goal prior to program exit. [name] Marden Noble continue to come to class for exercise and education.  We will continue to monitor.         Core Components/Risk Factors/Patient Goals at Discharge (Final Review):      Goals and Risk Factor Review - 02/29/16 0949      Core Components/Risk Factors/Patient Goals Review   Personal Goals Review Weight  Management/Obesity;Diabetes;Hypertension;Lipids   Review [name] Marden Noblen day two today.  He is feeling good with exercise.  His weight was up a 1 lb today.  He is checking his blood sugar and blood pressures at home.  He continues to work on his diet.   Expected Outcomes [name] Marden Noble continue to come to class for exercise and education.  We will continue to monitor.      ITP Comments:     ITP Comments    Row Name 02/20/16 1425 03/06/16 0727         ITP Comments Medical review completed. ITP created. COntinue with ITP   Documentation od asmission diagnosis can be found [facility]/2017 Discharge Summary 30 day review. Continue with ITP unless changes noted by Medical Director at signature of review.         Comments:

## 2016-03-07 DIAGNOSIS — Z951 Presence of aortocoronary bypass graft: Secondary | ICD-10-CM

## 2016-03-07 LAB — GLUCOSE, CAPILLARY: GLUCOSE-CAPILLARY: 133 mg/dL — AB (ref 65–99)

## 2016-03-07 NOTE — Progress Notes (Signed)
Daily Session Note  Patient Details  Name: Jorge Adams MRN: [id number] Date of Birth: 01/26/1951 Referring Provider:   Flowsheet Row Cardiac Rehab from 02/20/2016 in [facility] Cardiac and Pulmonary Rehab  Referring Provider  Adrian Prows MD      Encounter Date: 03/07/2016  Check In:     Session Check In - 03/07/16 0844      Check-In   Location [facility]-Cardiac & Pulmonary Rehab   Staff Present Alberteen Sam, MA, ACSM RCEP, Exercise Physiologist;Jere Vanburen Oletta Darter, BA, ACSM CEP, Exercise Physiologist;Carroll Enterkin, RN, BSN   Supervising physician immediately available to respond to emergencies See telemetry face sheet for immediately available ER MD   Medication changes reported     No   Fall or balance concerns reported    No   Warm-up and Cool-down Performed on first and last piece of equipment   Resistance Training Performed Yes   VAD Patient? No     Pain Assessment   Currently in Pain? No/denies   Multiple Pain Sites No         Goals Met:  Independence with exercise equipment Exercise tolerated well No report of cardiac concerns or symptoms Strength training completed today  Goals Unmet:  Not Applicable  Comments:Reviewed home exercise with pt today.  [place].  Reviewed THR, pulse, RPE, sign and symptoms, NTG use, and when to call 911 or MD.  Also discussed weather considerations and indoor options.  Pt voiced understanding.   Dr. Emily Filbert is Medical Director for [place] and [facility].

## 2016-03-12 ENCOUNTER — Encounter: Payer: 59 | Admitting: *Deleted

## 2016-03-12 DIAGNOSIS — Z951 Presence of aortocoronary bypass graft: Secondary | ICD-10-CM

## 2016-03-12 NOTE — Progress Notes (Signed)
Daily Session Note  Patient Details  Name: Jorge Adams MRN: [id number] Date of Birth: 09-22-1950 Referring Provider:   Flowsheet Row Cardiac Rehab from 02/20/2016 in [facility] Cardiac and Pulmonary Rehab  Referring Provider  Adrian Prows MD      Encounter Date: 03/12/2016  Check In:     Session Check In - 03/12/16 0841      Check-In   Location [facility]-Cardiac & Pulmonary Rehab   Staff Present Nyoka Cowden, RN, BSN, Willette Pa, MA, ACSM RCEP, Exercise Physiologist;Susanne Bice, RN, BSN, CCRP   Supervising physician immediately available to respond to emergencies See telemetry Adams sheet for immediately available ER MD   Medication changes reported     No   Fall or balance concerns reported    No   Warm-up and Cool-down Performed on first and last piece of equipment   Resistance Training Performed Yes   VAD Patient? No     Pain Assessment   Currently in Pain? No/denies   Multiple Pain Sites No         Goals Met:  Independence with exercise equipment Exercise tolerated well No report of cardiac concerns or symptoms Strength training completed today  Goals Unmet:  Not Applicable  Comments: Pt able to follow exercise prescription today without complaint.  Will continue to monitor for progression.    Dr. Emily Filbert is Medical Director for [place] and [facility].

## 2016-03-14 ENCOUNTER — Encounter: Payer: 59 | Admitting: *Deleted

## 2016-03-14 DIAGNOSIS — Z951 Presence of aortocoronary bypass graft: Secondary | ICD-10-CM

## 2016-03-14 NOTE — Progress Notes (Signed)
Daily Session Note  Patient Details  Name: Jorge Adams MRN: [id number] Date of Birth: Mar 11, 1951 Referring Provider:   Flowsheet Row Cardiac Rehab from 02/20/2016 in [facility] Cardiac and Pulmonary Rehab  Referring Provider  Adrian Prows MD      Encounter Date: 03/14/2016  Check In:     Session Check In - 03/14/16 1032      Check-In   Location [facility]-Cardiac & Pulmonary Rehab   Staff Present Nada Maclachlan, BA, ACSM CEP, Exercise Physiologist;Susanne Bice, RN, BSN, CCRP;Betzy Barbier Luan Pulling, MA, ACSM RCEP, Exercise Physiologist   Supervising physician immediately available to respond to emergencies See telemetry face sheet for immediately available ER MD   Medication changes reported     No   Fall or balance concerns reported    No   Warm-up and Cool-down Performed on first and last piece of equipment   Resistance Training Performed Yes   VAD Patient? No     Pain Assessment   Currently in Pain? No/denies   Multiple Pain Sites No           Exercise Prescription Changes - 03/13/16 1400      Exercise Review   Progression Yes     Response to Exercise   Blood Pressure (Admit) 130/80   Blood Pressure (Exercise) 144/64   Blood Pressure (Exit) 124/58   Heart Rate (Admit) 92 bpm   Heart Rate (Exercise) 126 bpm   Heart Rate (Exit) 96 bpm   Rating of Perceived Exertion (Exercise) 15   Symptoms none   Duration Progress to 45 minutes of aerobic exercise without signs/symptoms of physical distress   Intensity THRR unchanged     Progression   Progression Continue to progress workloads to maintain intensity without signs/symptoms of physical distress.   Average METs 2.8     Resistance Training   Training Prescription Yes   Weight 3 lbs   Reps 10-15     Interval Training   Interval Training No     Treadmill   MPH 2   Grade 1   Minutes 15   METs 2.81     NuStep   Level 4   Minutes 15   METs 3     Arm Ergometer   Level 2   Minutes 15   METs 2.6      Goals Met:   Independence with exercise equipment Exercise tolerated well No report of cardiac concerns or symptoms Strength training completed today  Goals Unmet:  Not Applicable  Comments: Pt able to follow exercise prescription today without complaint.  Will continue to monitor for progression.    Dr. Emily Filbert is Medical Director for [place] and [facility].

## 2016-03-19 ENCOUNTER — Encounter: Payer: 59 | Admitting: *Deleted

## 2016-03-19 DIAGNOSIS — Z951 Presence of aortocoronary bypass graft: Secondary | ICD-10-CM

## 2016-03-19 NOTE — Progress Notes (Signed)
Daily Session Note  Patient Details  Name: Jorge Adams MRN: [id number] Date of Birth: Jul 11, 1951 Referring Provider:   Flowsheet Row Cardiac Rehab from 02/20/2016 in [facility] Cardiac and Pulmonary Rehab  Referring Provider  Adrian Prows MD      Encounter Date: 03/19/2016  Check In:     Session Check In - 03/19/16 0826      Check-In   Location [facility]-Cardiac & Pulmonary Rehab   Staff Present Alberteen Sam, MA, ACSM RCEP, Exercise Physiologist;Susanne Bice, RN, BSN, CCRP;Carroll Enterkin, RN, BSN   Supervising physician immediately available to respond to emergencies See telemetry face sheet for immediately available ER MD   Medication changes reported     No   Fall or balance concerns reported    No   Warm-up and Cool-down Performed on first and last piece of equipment   Resistance Training Performed Yes   VAD Patient? No     Pain Assessment   Currently in Pain? No/denies   Multiple Pain Sites No         Goals Met:  Independence with exercise equipment Exercise tolerated well No report of cardiac concerns or symptoms Strength training completed today  Goals Unmet:  Not Applicable  Comments: Pt able to follow exercise prescription today without complaint.  Will continue to monitor for progression.    Dr. Emily Filbert is Medical Director for [place] and [facility].

## 2016-03-21 DIAGNOSIS — Z951 Presence of aortocoronary bypass graft: Secondary | ICD-10-CM

## 2016-03-21 NOTE — Progress Notes (Signed)
Daily Session Note  Patient Details  Name: Jorge Adams MRN: [id number] Date of Birth: 1950/08/24 Referring Provider:   Flowsheet Row Cardiac Rehab from 02/20/2016 in [facility] Cardiac and Pulmonary Rehab  Referring Provider  Adrian Prows MD      Encounter Date: 03/21/2016  Check In:     Session Check In - 03/21/16 0829      Check-In   Location [facility]-Cardiac & Pulmonary Rehab   Staff Present Alberteen Sam, MA, ACSM RCEP, Exercise Physiologist;Kehinde Bowdish Oletta Darter, BA, ACSM CEP, Exercise Physiologist;Carroll Enterkin, RN, BSN   Supervising physician immediately available to respond to emergencies See telemetry face sheet for immediately available ER MD   Medication changes reported     No   Fall or balance concerns reported    No   Warm-up and Cool-down Performed on first and last piece of equipment   Resistance Training Performed Yes   VAD Patient? No     Pain Assessment   Currently in Pain? No/denies   Multiple Pain Sites No         Goals Met:  Independence with exercise equipment Exercise tolerated well No report of cardiac concerns or symptoms Strength training completed today  Goals Unmet:  Not Applicable  Comments: Pt able to follow exercise prescription today without complaint.  Will continue to monitor for progression.  Reviewed home exercise with pt today.  [place]  Reviewed THR, pulse, RPE, sign and symptoms, NTG use, and when to call 911 or MD.  Also discussed weather considerations and indoor options.  Pt voiced understanding.   Dr. Emily Filbert is Medical Director for [place] and [facility].

## 2016-03-26 ENCOUNTER — Encounter: Payer: 59 | Attending: Cardiology

## 2016-03-26 DIAGNOSIS — Z951 Presence of aortocoronary bypass graft: Secondary | ICD-10-CM | POA: Insufficient documentation

## 2016-04-02 ENCOUNTER — Encounter: Payer: 59 | Admitting: *Deleted

## 2016-04-02 DIAGNOSIS — Z951 Presence of aortocoronary bypass graft: Secondary | ICD-10-CM | POA: Diagnosis present

## 2016-04-02 NOTE — Progress Notes (Signed)
Daily Session Note  Patient Details  Name: Jorge Adams MRN: [id number] Date of Birth: [birth year] Referring Provider:   Flowsheet Row Cardiac Rehab from 02/20/2016 in [facility] Cardiac and Pulmonary Rehab  Referring Provider  Adrian Prows MD      Encounter Date: 04/02/2016  Check In:     Session Check In - 04/02/16 1031      Check-In   Location [facility]-Cardiac & Pulmonary Rehab   Staff Present Heath Lark, RN, BSN, CCRP;Nalah Macioce [place], MA, ACSM RCEP, Exercise Physiologist;Laureen Owens Shark, BS, RRT, Respiratory Therapist   Supervising physician immediately available to respond to emergencies See telemetry face sheet for immediately available ER MD   Medication changes reported     No   Fall or balance concerns reported    No   Warm-up and Cool-down Performed on first and last piece of equipment   Resistance Training Performed Yes   VAD Patient? No     Pain Assessment   Currently in Pain? No/denies   Multiple Pain Sites No         Goals Met:  Independence with exercise equipment Exercise tolerated well No report of cardiac concerns or symptoms Strength training completed today  Goals Unmet:  Not Applicable  Comments: Pt able to follow exercise prescription today without complaint.  Will continue to monitor for progression.    Dr. Emily Filbert is Medical Director for [place] and [facility].

## 2016-04-03 ENCOUNTER — Encounter: Payer: Self-pay | Admitting: *Deleted

## 2016-04-03 DIAGNOSIS — Z951 Presence of aortocoronary bypass graft: Secondary | ICD-10-CM

## 2016-04-03 NOTE — Progress Notes (Signed)
Cardiac Individual Treatment Plan  Patient Details  Name: Jorge Adams MRN: [id number] Date of Birth: 1951/01/24 Referring Provider:   Flowsheet Row Cardiac Rehab from 02/20/2016 in [facility] Cardiac and Pulmonary Rehab  Referring Provider  Adrian Prows MD      Initial Encounter Date:  Flowsheet Row Cardiac Rehab from 02/20/2016 in [facility] Cardiac and Pulmonary Rehab  Date  02/20/16  Referring Provider  Adrian Prows MD      Visit Diagnosis: S/P CABG x 3  Patient's Home Medications on Admission:  Current Outpatient Prescriptions:  .  aspirin EC 81 MG tablet, Take 81 mg by mouth daily., Disp: , Rfl:  .  brimonidine (ALPHAGAN P) 0.1 % SOLN, Place 1 drop into both eyes 2 (two) times daily., Disp: , Rfl:  .  cilostazol (PLETAL) 100 MG tablet, Take 100 mg by mouth 2 (two) times daily., Disp: , Rfl: 3 .  dorzolamide-timolol (COSOPT) 22.3-6.8 MG/ML ophthalmic solution, Place 1 drop into both eyes 2 (two) times daily., Disp: , Rfl:  .  empagliflozin (JARDIANCE) 10 MG TABS tablet, Take 10 mg by mouth daily., Disp: 30 tablet, Rfl: 12 .  fenofibrate (TRICOR) 145 MG tablet, Take 145 mg by mouth daily., Disp: , Rfl:  .  metFORMIN (GLUCOPHAGE) 1000 MG tablet, Take 1 tablet (1,000 mg total) by mouth 2 (two) times daily., Disp: 180 tablet, Rfl: 1 .  metoprolol tartrate (LOPRESSOR) 25 MG tablet, Take 25 mg by mouth 2 (two) times daily., Disp: , Rfl:  .  nitroGLYCERIN (NITROSTAT) 0.4 MG SL tablet, PLACE 1 TABLET UNDER TONGUE EVERY 5 MINUTES AS NEEDED FOR CHEST PAIN., Disp: , Rfl: 2 .  rosuvastatin (CRESTOR) 20 MG tablet, Take 20 mg by mouth daily. , Disp: , Rfl:  .  TRAVATAN Z 0.004 % SOLN ophthalmic solution, Place 1 drop into both eyes at bedtime., Disp: , Rfl:   Past Medical History: Past Medical History:  Diagnosis Date  . ASHD (arteriosclerotic heart disease)   . Atherosclerosis of native arteries of the extremities with intermittent claudication   . Coronary atherosclerosis of native coronary artery    . Diabetes mellitus    NIDDM  . Dyslipidemia   . ED (erectile dysfunction)   . Full dentures   . Glaucoma associated with ocular disorder, mild stage    bilateral  . Hypertension    under control, has been on med. > 9 yr.  . Impotence of organic origin   . Meniscus tear 05/2012   right knee  . Myocardial infarction ([place]) 10/2002  . Stented coronary artery     Tobacco Use: History  Smoking Status  . Former Smoker  . Quit date: 12/02/2002  Smokeless Tobacco  . Never Used    Labs: Recent Review Flowsheet Data    Labs for ITP Cardiac and Pulmonary Rehab Latest Ref Rng & Units 01/04/2016 01/05/2016 01/06/2016 01/08/2016 03/01/2016   Cholestrol 0 - 200 mg/dL - - - - 130   LDLCALC 0 - 99 mg/dL - - - - 46   LDLDIRECT mg/dL - - - - -   HDL >39.00 mg/dL - - - - 46.10   Trlycerides 0.0 - 149.0 mg/dL - - 230(H) 185(H) 187.0(H)   Hemoglobin A1c 4.6 - 6.5 % - - - - 5.9   PHART 7.350 - 7.450 - - - - -   PCO2ART 35.0 - 45.0 mmHg - - - - -   HCO3 20.0 - 24.0 mEq/L - - - - -   TCO2 0 -  100 mmol/L 27 29 - - -   ACIDBASEDEF 0.0 - 2.0 mmol/L - - - - -   O2SAT % - - - - -       Exercise Target Goals:    Exercise Program Goal: Individual exercise prescription set with THRR, safety & activity barriers. Participant demonstrates ability to understand and report RPE using BORG scale, to self-measure pulse accurately, and to acknowledge the importance of the exercise prescription.  Exercise Prescription Goal: Starting with aerobic activity 30 plus minutes a day, 3 days per week for initial exercise prescription. Provide home exercise prescription and guidelines that participant acknowledges understanding prior to discharge.  Activity Barriers & Risk Stratification:     Activity Barriers & Cardiac Risk Stratification - 02/20/16 1452      Activity Barriers & Cardiac Risk Stratification   Activity Barriers Joint Problems;Deconditioning  Hips hurt when walking. Further discussion: Md has  evaluated for PAD and may plan stent placement to left leg in future.   Cardiac Risk Stratification High      6 Minute Walk:     6 Minute Walk    Row Name 02/20/16 1450         6 Minute Walk   Phase Initial     Distance 1530 feet     Walk Time 6 minutes     # of Rest Breaks 0     MPH 2.9     METS 3.44     RPE 11     VO2 Peak 12.04     Symptoms Yes (comment)     Comments hip pain 4/10     Resting HR 84 bpm     Resting BP 126/64     Max Ex. HR 106 bpm     Max Ex. BP 132/72     2 Minute Post BP 130/70        Initial Exercise Prescription:     Initial Exercise Prescription - 02/20/16 1400      Date of Initial Exercise RX and Referring Provider   Date 02/20/16   Referring Provider Adrian Prows MD     Treadmill   MPH 2.5   Grade 1.5   Minutes 15   METs 3.43     NuStep   Level 3   Minutes 15   METs 2     Arm Ergometer   Level 2   Minutes 15   METs 3     Prescription Details   Frequency (times per week) 2   Duration Progress to 45 minutes of aerobic exercise without signs/symptoms of physical distress     Intensity   THRR 40-80% of Max Heartrate 112-140   Ratings of Perceived Exertion 11-15   Perceived Dyspnea 0-4     Progression   Progression Continue to progress workloads to maintain intensity without signs/symptoms of physical distress.     Resistance Training   Training Prescription Yes   Weight 3 lbs   Reps 10-12      Perform Capillary Blood Glucose checks as needed.  Exercise Prescription Changes:     Exercise Prescription Changes    Row Name 02/20/16 1400 02/29/16 1400 03/13/16 1400 03/21/16 1000 03/27/16 1400     Exercise Review   Progression  - Yes Yes Yes Yes     Response to Exercise   Blood Pressure (Admit) 124/64 134/70 130/80  - 128/64   Blood Pressure (Exercise) 132/74 140/80 144/64  - 130/64   Blood Pressure (Exit) 130/70 134/70  124/58  - 114/70   Heart Rate (Admit) 84 bpm 66 bpm 92 bpm  - 77 bpm   Heart Rate (Exercise)  106 bpm 130 bpm 126 bpm  - 128 bpm   Heart Rate (Exit) 85 bpm 83 bpm 96 bpm  - 88 bpm   Rating of Perceived Exertion (Exercise) '11 11 15  '$ - 12   Symptoms  - none none none none   Comments  -  -  - Home Exercise Guidelines given 03/21/16 Home Exercise Guidelines given 03/21/16   Duration  - Progress to 45 minutes of aerobic exercise without signs/symptoms of physical distress Progress to 45 minutes of aerobic exercise without signs/symptoms of physical distress Progress to 45 minutes of aerobic exercise without signs/symptoms of physical distress Progress to 45 minutes of aerobic exercise without signs/symptoms of physical distress   Intensity  - THRR unchanged THRR unchanged THRR unchanged THRR unchanged     Progression   Progression  - Continue to progress workloads to maintain intensity without signs/symptoms of physical distress. Continue to progress workloads to maintain intensity without signs/symptoms of physical distress. Continue to progress workloads to maintain intensity without signs/symptoms of physical distress. Continue to progress workloads to maintain intensity without signs/symptoms of physical distress.   Average METs  - 2.6 2.8 2.8 3.1     Resistance Training   Training Prescription  - Yes Yes Yes Yes   Weight  - 3 lbs 3 lbs 3 lbs 4 lbs   Reps  - 10-12 10-15 10-15 10-15     Interval Training   Interval Training  - No No No No     Treadmill   MPH  - '2 2 2 2   '$ Grade  - '1 1 1 1   '$ Minutes  - '15 15 15 15   '$ METs  - 2.81 2.81 2.81 2.81     NuStep   Level  - '3 4 4 4   '$ Minutes  - '15 15 15 15   '$ METs  - 2.'6 3 3 '$ 3.8     Arm Ergometer   Level  - '2 2 2 2   '$ Minutes  - '15 15 15 15   '$ METs  - 2.4 2.6 2.6 2.7     Home Exercise Plan   Plans to continue exercise at  -  -  - [employer] (comment)  walking at [employer] (comment)  walking at the mall   Frequency  -  -  - Add 2 additional days to program exercise sessions. Add 2 additional days to program  exercise sessions.      Exercise Comments:     Exercise Comments    Row Name 02/29/16 1448 03/13/16 1404 03/21/16 1035 03/27/16 1427     Exercise Comments [name]'s first day of exercise was on 02/27/16.  He is off to a good start with exercise.  We will continue to monitor for progression. Marden Noble continues to do well with exercise.  He is up to 3 METs on the NuStep.  We will continue to monitor for progress. Reviewed home exercise with pt today.  Pt plans to walking at the mall for exercise.  Reviewed THR, pulse, RPE, sign and symptoms, NTG use, and when to call 911 or MD.  Also discussed weather considerations and indoor options.  Pt voiced understanding. Marden Noble is doing well in rehab.  He has been out this week on vacation.  He has made some progress.  We will continue  to monitor.       Discharge Exercise Prescription (Final Exercise Prescription Changes):     Exercise Prescription Changes - 03/27/16 1400      Exercise Review   Progression Yes     Response to Exercise   Blood Pressure (Admit) 128/64   Blood Pressure (Exercise) 130/64   Blood Pressure (Exit) 114/70   Heart Rate (Admit) 77 bpm   Heart Rate (Exercise) 128 bpm   Heart Rate (Exit) 88 bpm   Rating of Perceived Exertion (Exercise) 12   Symptoms none   Comments Home Exercise Guidelines given 03/21/16   Duration Progress to 45 minutes of aerobic exercise without signs/symptoms of physical distress   Intensity THRR unchanged     Progression   Progression Continue to progress workloads to maintain intensity without signs/symptoms of physical distress.   Average METs 3.1     Resistance Training   Training Prescription Yes   Weight 4 lbs   Reps 10-15     Interval Training   Interval Training No     Treadmill   MPH 2   Grade 1   Minutes 15   METs 2.81     NuStep   Level 4   Minutes 15   METs 3.8     Arm Ergometer   Level 2   Minutes 15   METs 2.7     Home Exercise Plan   Plans to continue exercise at  [employer] (comment)  walking at the mall   Frequency Add 2 additional days to program exercise sessions.      Nutrition:  Target Goals: Understanding of nutrition guidelines, daily intake of sodium '1500mg'$ , cholesterol '200mg'$ , calories 30% from fat and 7% or less from saturated fats, daily to have 5 or more servings of fruits and vegetables.  Biometrics:     Pre Biometrics - 02/20/16 1457      Pre Biometrics   Height 5' 10.3" (1.786 m)   Weight 215 lb 1.6 oz (97.6 kg)   Waist Circumference 43 inches   Hip Circumference 44 inches   Waist to Hip Ratio 0.98 %   BMI (Calculated) 30.7       Nutrition Therapy Plan and Nutrition Goals:     Nutrition Therapy & Goals - 02/20/16 1447      Intervention Plan   Intervention Prescribe, educate and counsel regarding individualized specific dietary modifications aiming towards targeted core components such as weight, hypertension, lipid management, diabetes, heart failure and other comorbidities.   Expected Outcomes Short Term Goal: Understand basic principles of dietary content, such as calories, fat, sodium, cholesterol and nutrients.;Short Term Goal: A plan has been developed with personal nutrition goals set during dietitian appointment.;Long Term Goal: Adherence to prescribed nutrition plan.      Nutrition Discharge: Rate Your Plate Scores:     Nutrition Assessments - 03/04/16 1535      Rate Your Plate Scores   Pre Score 78   Pre Score % 86.6 %      Nutrition Goals Re-Evaluation:   Psychosocial: Target Goals: Acknowledge presence or absence of depression, maximize coping skills, provide positive support system. Participant is able to verbalize types and ability to use techniques and skills needed for reducing stress and depression.  Initial Review & Psychosocial Screening:     Initial Psych Review & Screening - 02/20/16 1450      Initial Review   Current issues with --  none reported     [facility]  Good Support System? Yes  Wife,Children and friends     Barriers   Psychosocial barriers to participate in program There are no identifiable barriers or psychosocial needs.;The patient should benefit from training in stress management and relaxation.     Screening Interventions   Interventions Encouraged to exercise      Quality of Life Scores:     Quality of Life - 02/20/16 1508      Quality of Life Scores   Health/Function Pre 22.9 %   Socioeconomic Pre 30 %   Psych/Spiritual Pre 30 %   Family Pre 27.6 %   GLOBAL Pre 26.51 %      PHQ-9: Recent Review Flowsheet Data    Depression screen [facility] 2/9 02/20/2016 05/29/2012   Decreased Interest 0 0   Down, Depressed, Hopeless 0 0   PHQ - 2 Score 0 0   Altered sleeping 0 -   Tired, decreased energy 0 -   Change in appetite 0 -   Feeling bad or failure about yourself  0 -   Trouble concentrating 0 -   Moving slowly or fidgety/restless 0 -   Suicidal thoughts 0 -   PHQ-9 Score 0 -   Difficult doing work/chores Not difficult at all -      Psychosocial Evaluation and Intervention:     Psychosocial Evaluation - 02/27/16 1006      Psychosocial Evaluation & Interventions   Interventions Encouraged to exercise with the program and follow exercise prescription   Comments Counselor met with Mr. Fecher today for initial psychosocial evaluation.  He is a 65 year old who had triple bypass surgery on 6/13. He had a  previous surgery with a stent inserted in 2004 as well.  Mr. Lemmie Evens has a strong support system with a spouse of 5 years and several adult children who live close by as well as active involvement in his local church.  Mr. Lemmie Evens has diabetes in addition to heart disease.  He states he sleeps well and has a good appetite.  He denies a history of depression or anxiety or any current symptoms.  His mood is reportedly positive most of the time and he has minimal stress in his life, other than his health.  Mr. Lemmie Evens has goals to lose weight;  increase his energy; stamina; and strength while in this program.        Psychosocial Re-Evaluation:   Vocational Rehabilitation: Provide vocational rehab assistance to qualifying candidates.   Vocational Rehab Evaluation & Intervention:     Vocational Rehab - 02/20/16 1454      Initial Vocational Rehab Evaluation & Intervention   Assessment shows need for Vocational Rehabilitation No      Education: Education Goals: Education classes will be provided on a weekly basis, covering required topics. Participant will state understanding/return demonstration of topics presented.  Learning Barriers/Preferences:     Learning Barriers/Preferences - 02/20/16 1454      Learning Barriers/Preferences   Learning Barriers None   Learning Preferences None      Education Topics: General Nutrition Guidelines/Fats and Fiber: -Group instruction provided by verbal, written material, models and posters to present the general guidelines for heart healthy nutrition. Gives an explanation and review of dietary fats and fiber.   Controlling Sodium/Reading Food Labels: -Group verbal and written material supporting the discussion of sodium use in heart healthy nutrition. Review and explanation with models, verbal and written materials for utilization of the food label.   Exercise Physiology & Risk Factors: -  Group verbal and written instruction with models to review the exercise physiology of the cardiovascular system and associated critical values. Details cardiovascular disease risk factors and the goals associated with each risk factor. Flowsheet Row Cardiac Rehab from 04/02/2016 in [facility] Cardiac and Pulmonary Rehab  Date  02/27/16  Educator  [facility]  Instruction Review Code  2- meets goals/outcomes      Aerobic Exercise & Resistance Training: - Gives group verbal and written discussion on the health impact of inactivity. On the components of aerobic and resistive training programs and the  benefits of this training and how to safely progress through these programs. Flowsheet Row Cardiac Rehab from 04/02/2016 in [facility] Cardiac and Pulmonary Rehab  Date  02/29/16  Educator  AS  Instruction Review Code  2- meets goals/outcomes      Flexibility, Balance, General Exercise Guidelines: - Provides group verbal and written instruction on the benefits of flexibility and balance training programs. Provides general exercise guidelines with specific guidelines to those with heart or lung disease. Demonstration and skill practice provided.   Stress Management: - Provides group verbal and written instruction about the health risks of elevated stress, cause of high stress, and healthy ways to reduce stress. Flowsheet Row Cardiac Rehab from 04/02/2016 in [facility] Cardiac and Pulmonary Rehab  Date  03/07/16  Educator  CE  Instruction Review Code  2- meets goals/outcomes      Depression: - Provides group verbal and written instruction on the correlation between heart/lung disease and depressed mood, treatment options, and the stigmas associated with seeking treatment.   Anatomy & Physiology of the Heart: - Group verbal and written instruction and models provide basic cardiac anatomy and physiology, with the coronary electrical and arterial systems. Review of: AMI, Angina, Valve disease, Heart Failure, Cardiac Arrhythmia, Pacemakers, and the ICD. Flowsheet Row Cardiac Rehab from 04/02/2016 in [facility] Cardiac and Pulmonary Rehab  Date  03/12/16  Educator  SB  Instruction Review Code  2- meets goals/outcomes      Cardiac Procedures: - Group verbal and written instruction and models to describe the testing methods done to diagnose heart disease. Reviews the outcomes of the test results. Describes the treatment choices: Medical Management, Angioplasty, or Coronary Bypass Surgery. Flowsheet Row Cardiac Rehab from 04/02/2016 in [facility] Cardiac and Pulmonary Rehab  Date  03/19/16  Educator  CE   Instruction Review Code  2- meets goals/outcomes      Cardiac Medications: - Group verbal and written instruction to review commonly prescribed medications for heart disease. Reviews the medication, class of the drug, and side effects. Includes the steps to properly store meds and maintain the prescription regimen. Flowsheet Row Cardiac Rehab from 04/02/2016 in [facility] Cardiac and Pulmonary Rehab  Date  03/21/16  Educator  CE  Instruction Review Code  2- meets goals/outcomes      Go Sex-Intimacy & Heart Disease, Get SMART - Goal Setting: - Group verbal and written instruction through game format to discuss heart disease and the return to sexual intimacy. Provides group verbal and written material to discuss and apply goal setting through the application of the S.M.A.R.T. Method. Flowsheet Row Cardiac Rehab from 04/02/2016 in [facility] Cardiac and Pulmonary Rehab  Date  03/19/16  Educator  CE  Instruction Review Code  2- meets goals/outcomes      Other Matters of the Heart: - Provides group verbal, written materials and models to describe Heart Failure, Angina, Valve Disease, and Diabetes in the realm of heart disease. Includes description of the  disease process and treatment options available to the cardiac patient. Flowsheet Row Cardiac Rehab from 04/02/2016 in [facility] Cardiac and Pulmonary Rehab  Date  03/12/16  Educator  SB  Instruction Review Code  2- meets goals/outcomes      Exercise & Equipment Safety: - Individual verbal instruction and demonstration of equipment use and safety with use of the equipment. Flowsheet Row Cardiac Rehab from 04/02/2016 in [facility] Cardiac and Pulmonary Rehab  Date  02/20/16  Educator  SB  Instruction Review Code  2- meets goals/outcomes      Infection Prevention: - Provides verbal and written material to individual with discussion of infection control including proper hand washing and proper equipment cleaning during exercise session. Flowsheet Row  Cardiac Rehab from 04/02/2016 in [facility] Cardiac and Pulmonary Rehab  Date  02/20/16  Educator  SB  Instruction Review Code  2- meets goals/outcomes      Falls Prevention: - Provides verbal and written material to individual with discussion of falls prevention and safety.   Diabetes: - Individual verbal and written instruction to review signs/symptoms of diabetes, desired ranges of glucose level fasting, after meals and with exercise. Advice that pre and post exercise glucose checks will be done for 3 sessions at entry of program. [place] from 04/02/2016 in [facility] Cardiac and Pulmonary Rehab  Date  02/20/16  Educator  SB  Instruction Review Code  2- meets goals/outcomes       Knowledge Questionnaire Score:     Knowledge Questionnaire Score - 02/20/16 1454      Knowledge Questionnaire Score   Pre Score 25/28      Core Components/Risk Factors/Patient Goals at Admission:     Personal Goals and Risk Factors at Admission - 02/20/16 1447      Core Components/Risk Factors/Patient Goals on Admission    Weight Management Weight Maintenance;Yes   Admit Weight 215 lb (97.5 kg)   Goal Weight: Short Term 210 lb (95.3 kg)   Goal Weight: Long Term 200 lb (90.7 kg)   Expected Outcomes Short Term: Continue to assess and modify interventions until short term weight is achieved;Weight Loss: Understanding of general recommendations for a balanced deficit meal plan, which promotes 1-2 lb weight loss per week and includes a negative energy balance of [phone] kcal/d;Long Term: Adherence to nutrition and physical activity/exercise program aimed toward attainment of established weight goal   Diabetes Yes   Intervention Provide education about signs/symptoms and action to take for hypo/hyperglycemia.;Provide education about proper nutrition, including hydration, and aerobic/resistive exercise prescription along with prescribed medications to achieve blood glucose in normal ranges:  Fasting glucose 65-99 mg/dL   Expected Outcomes Short Term: Participant verbalizes understanding of the signs/symptoms and immediate care of hyper/hypoglycemia, proper foot care and importance of medication, aerobic/resistive exercise and nutrition plan for blood glucose control.;Long Term: Attainment of HbA1C < 7%.  LAst HbA1C was 6.8%   Hypertension Yes   Intervention Provide education on lifestyle modifcations including regular physical activity/exercise, weight management, moderate sodium restriction and increased consumption of fresh fruit, vegetables, and low fat dairy, alcohol moderation, and smoking cessation.;Monitor prescription use compliance.   Expected Outcomes Short Term: Continued assessment and intervention until BP is < 140/25m HG in hypertensive participants. < 130/822mHG in hypertensive participants with diabetes, heart failure or chronic kidney disease.;Long Term: Maintenance of blood pressure at goal levels.   Lipids Yes   Intervention Provide education and support for participant on nutrition & aerobic/resistive exercise along with prescribed medications to achieve  LDL '70mg'$ , HDL >'40mg'$ .   Expected Outcomes Short Term: Participant states understanding of desired cholesterol values and is compliant with medications prescribed. Participant is following exercise prescription and nutrition guidelines.;Long Term: Cholesterol controlled with medications as prescribed, with individualized exercise RX and with personalized nutrition plan. Value goals: LDL < '70mg'$ , HDL > 40 mg.      Core Components/Risk Factors/Patient Goals Review:      Goals and Risk Factor Review    Row Name 02/27/16 1440 02/29/16 0949 03/21/16 1036         Core Components/Risk Factors/Patient Goals Review   Personal Goals Review Weight Management/Obesity;Lipids;Hypertension;Diabetes Weight Management/Obesity;Diabetes;Hypertension;Lipids Weight Management/Obesity;Sedentary;Increase Strength and Stamina;Heart  Failure;Lipids;Hypertension;Stress     Review Reviewed risk factor modification with Nathaneil Canary today.  He is starting program. He has Weight goal of 200 lbs longterm 210 short term. Already stopped sodas and drinking water only. No fried, only grilled or broiled. Will set up RD appointment. Expects blood work ina week fro cholesterol followup. Compliant with medications. Marden Noble is on day two today.  He is feeling good with exercise.  His weight was up a 1 lb today.  He is checking his blood sugar and blood pressures at home.  He continues to work on his diet. Marden Noble is doing well.  His weight was up some today, he thinks that is fluid since he did not take his Lasix yesterday.  His blood sugars and blood pressures have been good. He is taking his statin without any problems.  His stress levels have improved.  Marden Noble is looking forward to his vacation next week at [facility].      Expected Outcomes COntinued compliance with medications, addition of nutriotin plan once meets with the RD and following the exercise program as prescribed to all aid in keeping  risk factors controlled and aid in weight loss goal prior to program exit. Marden Noble will continue to come to class for exercise and education.  We will continue to monitor. Marden Noble will continue to attend exercise and education classes to work on weight loss and stamina.  He will be walking at home on his days off.        Core Components/Risk Factors/Patient Goals at Discharge (Final Review):      Goals and Risk Factor Review - 03/21/16 1036      Core Components/Risk Factors/Patient Goals Review   Personal Goals Review Weight Management/Obesity;Sedentary;Increase Strength and Stamina;Heart Failure;Lipids;Hypertension;Stress   Review Marden Noble is doing well.  His weight was up some today, he thinks that is fluid since he did not take his Lasix yesterday.  His blood sugars and blood pressures have been good. He is taking his statin without any problems.  His stress levels have  improved.  Marden Noble is looking forward to his vacation next week at [facility].    Expected Outcomes Marden Noble will continue to attend exercise and education classes to work on weight loss and stamina.  He will be walking at home on his days off.      ITP Comments:     ITP Comments    Row Name 02/20/16 1425 03/06/16 0727 04/03/16 0639       ITP Comments Medical review completed. ITP created. COntinue with ITP   Documentation od asmission diagnosis can be found [facility] 01/11/2016 Discharge Summary 30 day review. Continue with ITP unless changes noted by Medical Director at signature of review. 30 day review. Continue with ITP unless changes noted by Medical Director at signature of review.  Comments:

## 2016-04-04 ENCOUNTER — Encounter: Payer: 59 | Admitting: *Deleted

## 2016-04-04 DIAGNOSIS — Z951 Presence of aortocoronary bypass graft: Secondary | ICD-10-CM | POA: Diagnosis not present

## 2016-04-04 NOTE — Progress Notes (Signed)
Daily Session Note  Patient Details  Name: Jorge Adams MRN: [id number] Date of Birth: [birth year] Referring Provider:   Flowsheet Row Cardiac Rehab from 02/20/2016 in [facility] Cardiac and Pulmonary Rehab  Referring Provider  Adrian Prows MD      Encounter Date: 04/04/2016  Check In:     Session Check In - 04/04/16 0929      Check-In   Location [facility]-Cardiac & Pulmonary Rehab   Staff Present Alberteen Sam, MA, ACSM RCEP, Exercise Physiologist;Amanda Oletta Darter, BA, ACSM CEP, Exercise Physiologist;Carroll Enterkin, RN, BSN   Supervising physician immediately available to respond to emergencies See telemetry face sheet for immediately available ER MD   Medication changes reported     No   Fall or balance concerns reported    No   Warm-up and Cool-down Performed on first and last piece of equipment   Resistance Training Performed Yes   VAD Patient? No     Pain Assessment   Currently in Pain? No/denies   Multiple Pain Sites No         Goals Met:  Independence with exercise equipment Exercise tolerated well No report of cardiac concerns or symptoms Strength training completed today  Goals Unmet:  Not Applicable  Comments: Pt able to follow exercise prescription today without complaint.  Will continue to monitor for progression.    Dr. Emily Filbert is Medical Director for [place] and [facility].

## 2016-04-06 ENCOUNTER — Other Ambulatory Visit: Payer: Self-pay | Admitting: Family Medicine

## 2016-04-09 ENCOUNTER — Encounter: Payer: 59 | Admitting: *Deleted

## 2016-04-09 DIAGNOSIS — Z951 Presence of aortocoronary bypass graft: Secondary | ICD-10-CM

## 2016-04-09 NOTE — Progress Notes (Signed)
Daily Session Note  Patient Details  Name: Jorge Adams MRN: [id number] Date of Birth: [birth year] Referring Provider:   Flowsheet Row Cardiac Rehab from 02/20/2016 in [facility] Cardiac and Pulmonary Rehab  Referring Provider  Adrian Prows MD      Encounter Date: 04/09/2016  Check In:     Session Check In - 04/09/16 0817      Check-In   Location [facility]-Cardiac & Pulmonary Rehab   Staff Present Alberteen Sam, MA, ACSM RCEP, Exercise Physiologist;Susanne Bice, RN, BSN, CCRP;Carroll Enterkin, RN, BSN   Supervising physician immediately available to respond to emergencies See telemetry face sheet for immediately available ER MD   Medication changes reported     No   Fall or balance concerns reported    No   Warm-up and Cool-down Performed on first and last piece of equipment   Resistance Training Performed Yes   VAD Patient? No     Pain Assessment   Currently in Pain? No/denies   Multiple Pain Sites No         Goals Met:  Independence with exercise equipment Exercise tolerated well No report of cardiac concerns or symptoms Strength training completed today  Goals Unmet:  Not Applicable  Comments: Pt able to follow exercise prescription today without complaint.  Will continue to monitor for progression.  [name]'s blood pressure was elevated on the stepper today.  He was able to slow down and his blood pressure did come down too.  At the end of class, he mentioned that he had bruised his ribs yesterday and was having a little increased pain today.   Dr. Emily Filbert is Medical Director for [place] and [facility].

## 2016-04-11 DIAGNOSIS — Z951 Presence of aortocoronary bypass graft: Secondary | ICD-10-CM

## 2016-04-11 NOTE — Progress Notes (Signed)
Daily Session Note  Patient Details  Name: Jorge Adams MRN: [id number] Date of Birth: 23-Jul-1950 Referring Provider:   Flowsheet Row Cardiac Rehab from 02/20/2016 in [facility] Cardiac and Pulmonary Rehab  Referring Provider  Adrian Prows MD      Encounter Date: 04/11/2016  Check In:     Session Check In - 04/11/16 0902      Check-In   Location [facility]-Cardiac & Pulmonary Rehab   Staff Present Alberteen Sam, MA, ACSM RCEP, Exercise Physiologist;Bronsen Serano Oletta Darter, BA, ACSM CEP, Exercise Physiologist;Carroll Enterkin, RN, BSN   Supervising physician immediately available to respond to emergencies See telemetry face sheet for immediately available ER MD   Medication changes reported     No   Fall or balance concerns reported    No   Warm-up and Cool-down Performed on first and last piece of equipment   Resistance Training Performed Yes   VAD Patient? No     Pain Assessment   Currently in Pain? No/denies   Multiple Pain Sites No         Goals Met:  Independence with exercise equipment Exercise tolerated well No report of cardiac concerns or symptoms Strength training completed today  Goals Unmet:  Not Applicable  Comments: Pt able to follow exercise prescription today without complaint.  Will continue to monitor for progression.    Dr. Emily Filbert is Medical Director for [place] and [facility].

## 2016-04-16 ENCOUNTER — Encounter: Payer: 59 | Admitting: *Deleted

## 2016-04-16 DIAGNOSIS — Z951 Presence of aortocoronary bypass graft: Secondary | ICD-10-CM | POA: Diagnosis not present

## 2016-04-16 NOTE — Progress Notes (Signed)
Daily Session Note  Patient Details  Name: SHIVAM MESTAS MRN: [id number] Date of Birth: [birth year] Referring Provider:   Flowsheet Row Cardiac Rehab from 02/20/2016 in [facility] Cardiac and Pulmonary Rehab  Referring Provider  Adrian Prows MD      Encounter Date: 04/16/2016  Check In:     Session Check In - 04/16/16 0840      Check-In   Staff Present Heath Lark, RN, BSN, CCRP;Laureen Owens Shark, BS, RRT, Respiratory Therapist  Jaclynn Guarneri RN   Supervising physician immediately available to respond to emergencies See telemetry face sheet for immediately available ER MD   Medication changes reported     No   Fall or balance concerns reported    No   Warm-up and Cool-down Performed on first and last piece of equipment   Resistance Training Performed Yes   VAD Patient? No     Pain Assessment   Currently in Pain? No/denies         Goals Met:  Independence with exercise equipment Exercise tolerated well No report of cardiac concerns or symptoms Strength training completed today  Goals Unmet:  Not Applicable  Comments: Doing well with exercise prescription progression.    Dr. Emily Filbert is Medical Director for [place] and [facility].

## 2016-04-17 ENCOUNTER — Ambulatory Visit: Payer: Self-pay | Admitting: General Surgery

## 2016-04-23 ENCOUNTER — Encounter: Payer: 59 | Attending: Cardiology | Admitting: *Deleted

## 2016-04-23 DIAGNOSIS — Z951 Presence of aortocoronary bypass graft: Secondary | ICD-10-CM | POA: Diagnosis not present

## 2016-04-23 NOTE — Progress Notes (Signed)
Daily Session Note  Patient Details  Name: Jorge Adams MRN: [id number] Date of Birth: [birth year] Referring Provider:   Flowsheet Row Cardiac Rehab from 02/20/2016 in [facility] Cardiac and Pulmonary Rehab  Referring Provider  Adrian Prows MD      Encounter Date: 04/23/2016  Check In:     Session Check In - 04/23/16 0826      Check-In   Staff Present Heath Lark, RN, BSN, CCRP;Laureen Owens Shark, BS, RRT, Respiratory Lennie Hummer, MA, ACSM RCEP, Exercise Physiologist   Supervising physician immediately available to respond to emergencies See telemetry face sheet for immediately available ER MD   Medication changes reported     No   Fall or balance concerns reported    No   Warm-up and Cool-down Performed on first and last piece of equipment   Resistance Training Performed Yes   VAD Patient? No     Pain Assessment   Currently in Pain? No/denies         Goals Met:  Independence with exercise equipment Exercise tolerated well No report of cardiac concerns or symptoms Strength training completed today  Goals Unmet:  Not Applicable  Comments: Pt able to follow exercise prescription today without complaint.  Will continue to monitor for progression.    Dr. Emily Filbert is Medical Director for [place] and [facility].

## 2016-04-25 VITALS — Ht 70.3 in | Wt 214.0 lb

## 2016-04-25 DIAGNOSIS — Z951 Presence of aortocoronary bypass graft: Secondary | ICD-10-CM | POA: Diagnosis not present

## 2016-04-25 NOTE — Progress Notes (Signed)
Daily Session Note  Patient Details  Name: Jorge Adams MRN: [id number] Date of Birth: Nov 05, 1950 Referring Provider:   Flowsheet Row Cardiac Rehab from 02/20/2016 in [facility] Cardiac and Pulmonary Rehab  Referring Provider  Adrian Prows MD      Encounter Date: 04/25/2016  Check In:     Session Check In - 04/25/16 0839      Check-In   Location [facility]-Cardiac & Pulmonary Rehab   Staff Present Gerlene Burdock, RN, BSN;Jessica Luan Pulling, MA, ACSM RCEP, Exercise Physiologist;Gedeon Brandow Oletta Darter, BA, ACSM CEP, Exercise Physiologist   Supervising physician immediately available to respond to emergencies See telemetry face sheet for immediately available ER MD   Medication changes reported     No   Fall or balance concerns reported    No   Warm-up and Cool-down Performed on first and last piece of equipment   Resistance Training Performed Yes   VAD Patient? No     Pain Assessment   Currently in Pain? No/denies   Multiple Pain Sites No           Exercise Prescription Changes - 04/24/16 1500      Exercise Review   Progression Yes     Response to Exercise   Blood Pressure (Admit) 124/80   Blood Pressure (Exercise) 150/70   Blood Pressure (Exit) 124/64   Heart Rate (Admit) 85 bpm   Heart Rate (Exercise) 116 bpm   Heart Rate (Exit) 85 bpm   Rating of Perceived Exertion (Exercise) 12   Symptoms none   Comments Home Exercise Guidelines given 03/21/16   Duration Progress to 45 minutes of aerobic exercise without signs/symptoms of physical distress   Intensity THRR unchanged     Progression   Progression Continue to progress workloads to maintain intensity without signs/symptoms of physical distress.   Average METs 3.2     Resistance Training   Training Prescription Yes   Weight 4 lbs   Reps 10-15     Interval Training   Interval Training No     Treadmill   MPH 2   Grade 1   Minutes 15   METs 2.81     NuStep   Level 4   Minutes 15   METs 3.5     Arm Ergometer   Level  2   Minutes 15   METs 3     Home Exercise Plan   Plans to continue exercise at [employer] (comment)  walking at the mall   Frequency Add 2 additional days to program exercise sessions.      Goals Met:  Independence with exercise equipment Exercise tolerated well No report of cardiac concerns or symptoms Strength training completed today  Goals Unmet:  Not Applicable  Comments: Pt able to follow exercise prescription today without complaint.  Will continue to monitor for progression.      [place] Name 02/20/16 1450 04/25/16 1031       6 Minute Walk   Phase Initial Discharge    Distance 1530 feet 1590 feet    Distance % Change  - 3.9 %  60 ft    Walk Time 6 minutes 6 minutes    # of Rest Breaks 0 0    MPH 2.9 3.01    METS 3.44 4.07    RPE 11 13    VO2 Peak 12.04 14.25    Symptoms Yes (comment) No    Comments hip pain 4/10  -    Resting  HR 84 bpm 70 bpm    Resting BP 126/64 126/72    Max Ex. HR 106 bpm 122 bpm    Max Ex. BP 132/72 172/68    2 Minute Post BP 130/70  -        Dr. Emily Filbert is Medical Director for [place] and [facility].

## 2016-04-30 ENCOUNTER — Encounter: Payer: 59 | Admitting: *Deleted

## 2016-04-30 DIAGNOSIS — Z951 Presence of aortocoronary bypass graft: Secondary | ICD-10-CM | POA: Diagnosis not present

## 2016-04-30 NOTE — Patient Instructions (Signed)
Discharge Instructions  Patient Details  Name: Jorge Adams MRN: [id number] Date of Birth: February 14, 1951 Referring Provider:  Adrian Prows, MD   Number of Visits: 21  Reason for Discharge:  Patient reached a stable level of exercise. Patient independent in their exercise. Early Exit:  Personal  Smoking History:  History  Smoking Status  . Former Smoker  . Quit date: 12/02/2002  Smokeless Tobacco  . Never Used    Diagnosis:  S/P CABG x 3  Initial Exercise Prescription:     Initial Exercise Prescription - 02/20/16 1400      Date of Initial Exercise RX and Referring Provider   Date 02/20/16   Referring Provider Adrian Prows MD     Treadmill   MPH 2.5   Grade 1.5   Minutes 15   METs 3.43     NuStep   Level 3   Minutes 15   METs 2     Arm Ergometer   Level 2   Minutes 15   METs 3     Prescription Details   Frequency (times per week) 2   Duration Progress to 45 minutes of aerobic exercise without signs/symptoms of physical distress     Intensity   THRR 40-80% of Max Heartrate 112-140   Ratings of Perceived Exertion 11-15   Perceived Dyspnea 0-4     Progression   Progression Continue to progress workloads to maintain intensity without signs/symptoms of physical distress.     Resistance Training   Training Prescription Yes   Weight 3 lbs   Reps 10-12      Discharge Exercise Prescription (Final Exercise Prescription Changes):     Exercise Prescription Changes - 04/24/16 1500      Exercise Review   Progression Yes     Response to Exercise   Blood Pressure (Admit) 124/80   Blood Pressure (Exercise) 150/70   Blood Pressure (Exit) 124/64   Heart Rate (Admit) 85 bpm   Heart Rate (Exercise) 116 bpm   Heart Rate (Exit) 85 bpm   Rating of Perceived Exertion (Exercise) 12   Symptoms none   Comments Home Exercise Guidelines given 03/21/16   Duration Progress to 45 minutes of aerobic exercise without signs/symptoms of physical distress   Intensity  THRR unchanged     Progression   Progression Continue to progress workloads to maintain intensity without signs/symptoms of physical distress.   Average METs 3.2     Resistance Training   Training Prescription Yes   Weight 4 lbs   Reps 10-15     Interval Training   Interval Training No     Treadmill   MPH 2   Grade 1   Minutes 15   METs 2.81     NuStep   Level 4   Minutes 15   METs 3.5     Arm Ergometer   Level 2   Minutes 15   METs 3     Home Exercise Plan   Plans to continue exercise at [employer] (comment)  walking at the mall   Frequency Add 2 additional days to program exercise sessions.      Functional Capacity:     6 Minute Walk    [place] Name 02/20/16 1450 04/25/16 1031       6 Minute Walk   Phase Initial Discharge    Distance 1530 feet 1590 feet    Distance % Change  - 3.9 %  60 ft    Walk Time 6 minutes 6  minutes    # of Rest Breaks 0 0    MPH 2.9 3.01    METS 3.44 4.07    RPE 11 13    VO2 Peak 12.04 14.25    Symptoms Yes (comment) No    Comments hip pain 4/10  -    Resting HR 84 bpm 70 bpm    Resting BP 126/64 126/72    Max Ex. HR 106 bpm 122 bpm    Max Ex. BP 132/72 172/68    2 Minute Post BP 130/70  -       Quality of Life:     Quality of Life - 04/30/16 1034      Quality of Life Scores   Health/Function Pre 22.9 %   Health/Function Post 27.43 %   Health/Function % Change 19.78 %   Socioeconomic Pre 30 %   Socioeconomic Post 26.08 %   Socioeconomic % Change  -13.07 %   Psych/Spiritual Pre 30 %   Psych/Spiritual Post 27.43 %   Psych/Spiritual % Change -8.57 %   Family Pre 27.6 %   Family Post 27.43 %   Family % Change -0.62 %   GLOBAL Pre 26.51 %   GLOBAL Post 27.03 %   GLOBAL % Change 1.96 %      Personal Goals: Goals established at orientation with interventions provided to work toward goal.     Personal Goals and Risk Factors at Admission - 02/20/16 1447      Core Components/Risk Factors/Patient Goals  on Admission    Weight Management Weight Maintenance;Yes   Admit Weight 215 lb (97.5 kg)   Goal Weight: Short Term 210 lb (95.3 kg)   Goal Weight: Long Term 200 lb (90.7 kg)   Expected Outcomes Short Term: Continue to assess and modify interventions until short term weight is achieved;Weight Loss: Understanding of general recommendations for a balanced deficit meal plan, which promotes 1-2 lb weight loss per week and includes a negative energy balance of [phone] kcal/d;Long Term: Adherence to nutrition and physical activity/exercise program aimed toward attainment of established weight goal   Diabetes Yes   Intervention Provide education about signs/symptoms and action to take for hypo/hyperglycemia.;Provide education about proper nutrition, including hydration, and aerobic/resistive exercise prescription along with prescribed medications to achieve blood glucose in normal ranges: Fasting glucose 65-99 mg/dL   Expected Outcomes Short Term: Participant verbalizes understanding of the signs/symptoms and immediate care of hyper/hypoglycemia, proper foot care and importance of medication, aerobic/resistive exercise and nutrition plan for blood glucose control.;Long Term: Attainment of HbA1C < 7%.  LAst HbA1C was 6.8%   Hypertension Yes   Intervention Provide education on lifestyle modifcations including regular physical activity/exercise, weight management, moderate sodium restriction and increased consumption of fresh fruit, vegetables, and low fat dairy, alcohol moderation, and smoking cessation.;Monitor prescription use compliance.   Expected Outcomes Short Term: Continued assessment and intervention until BP is < 140/22m HG in hypertensive participants. < 130/835mHG in hypertensive participants with diabetes, heart failure or chronic kidney disease.;Long Term: Maintenance of blood pressure at goal levels.   Lipids Yes   Intervention Provide education and support for participant on nutrition &  aerobic/resistive exercise along with prescribed medications to achieve LDL '70mg'$ , HDL >'40mg'$ .   Expected Outcomes Short Term: Participant states understanding of desired cholesterol values and is compliant with medications prescribed. Participant is following exercise prescription and nutrition guidelines.;Long Term: Cholesterol controlled with medications as prescribed, with individualized exercise RX and with personalized nutrition plan. Value goals:  LDL < '70mg'$ , HDL > 40 mg.       Personal Goals Discharge:     Goals and Risk Factor Review - 04/09/16 1026      Core Components/Risk Factors/Patient Goals Review   Personal Goals Review Weight Management/Obesity;Sedentary;Increase Strength and Stamina;Hypertension;Heart Failure;Diabetes   Review Jorge Adams's weight has been up and down since starting, but he is weighing daily.  His blood pressures and blood sugars have been stable.  He has not had any heart failure sysmptoms and watches his salt intake.  He is walking at home for exercise on his off days.  His SOB has greatly improved as his strength and stamina are getting better.     Expected Outcomes Jorge Adams will continue to come to classes to work on his weight and strength and stamina.      Nutrition & Weight - Outcomes:     Pre Biometrics - 02/20/16 1457      Pre Biometrics   Height 5' 10.3" (1.786 m)   Weight 215 lb 1.6 oz (97.6 kg)   Waist Circumference 43 inches   Hip Circumference 44 inches   Waist to Hip Ratio 0.98 %   BMI (Calculated) 30.7         Post Biometrics - 04/25/16 1036       Post  Biometrics   Height 5' 10.3" (1.786 m)   Weight 214 lb (97.1 kg)   Waist Circumference 42.5 inches   Hip Circumference 40.5 inches   Waist to Hip Ratio 1.05 %   BMI (Calculated) 30.5      Nutrition:     Nutrition Therapy & Goals - 02/20/16 1447      Intervention Plan   Intervention Prescribe, educate and counsel regarding individualized specific dietary modifications aiming  towards targeted core components such as weight, hypertension, lipid management, diabetes, heart failure and other comorbidities.   Expected Outcomes Short Term Goal: Understand basic principles of dietary content, such as calories, fat, sodium, cholesterol and nutrients.;Short Term Goal: A plan has been developed with personal nutrition goals set during dietitian appointment.;Long Term Goal: Adherence to prescribed nutrition plan.      Nutrition Discharge:     Nutrition Assessments - 04/30/16 1032      Rate Your Plate Scores   Pre Score 78   Pre Score % 86.6 %   Post Score 74   Post Score % 82 %   % Change -4.6 %      Education Questionnaire Score:     Knowledge Questionnaire Score - 04/30/16 1032      Knowledge Questionnaire Score   Pre Score 25/28   Post Score 26/28      Goals reviewed with patient; copy given to patient.

## 2016-04-30 NOTE — Pre-Procedure Instructions (Signed)
Jorge Adams  04/30/2016     Your procedure is scheduled on : Monday May 06, 2016 at 7:30 AM.  Report to [facility] Admitting at 5:30 AM.  Call this number if you have problems the morning of surgery: [phone]    Remember:  Do not eat food or drink liquids after midnight.  Take these medicines the morning of surgery with A SIP OF WATER : Metoprolol (Lopressor); May use eye drops   Per Dr. Einar Gip please stop Pletal 5 days before surgery, but may continue aspirin (Stop 05/01/16)   Stop taking any vitamins, herbal medications/supplements, NSAIDs, Ibuprofen, Advil, Motrin, Aleve/Naproxen, etc today   DO NOT take any diabetic pills the morning of your surgery (NO Metformin/Glucophage; NO Jardiance)     How to Manage Your Diabetes Before and After Surgery  Why is it important to control my blood sugar before and after surgery? . Improving blood sugar levels before and after surgery helps healing and can limit problems. . A way of improving blood sugar control is eating a healthy diet by: o  Eating less sugar and carbohydrates o  Increasing activity/exercise o  Talking with your doctor about reaching your blood sugar goals . High blood sugars (greater than 180 mg/dL) can raise your risk of infections and slow your recovery, so you will need to focus on controlling your diabetes during the weeks before surgery. . Make sure that the doctor who takes care of your diabetes knows about your planned surgery including the date and location.  How do I manage my blood sugar before surgery? . Check your blood sugar at least 4 times a day, starting 2 days before surgery, to make sure that the level is not too high or low. o Check your blood sugar the morning of your surgery when you wake up and every 2 hours until you get to the Short Stay unit. . If your blood sugar is less than 70 mg/dL, you will need to treat for low blood sugar: o Do not take insulin. o Treat a low  blood sugar (less than 70 mg/dL) with  cup of clear juice (cranberry or apple), 4 glucose tablets, OR glucose gel. o Recheck blood sugar in 15 minutes after treatment (to make sure it is greater than 70 mg/dL). If your blood sugar is not greater than 70 mg/dL on recheck, call [phone] for further instructions. . Report your blood sugar to the short stay nurse when you get to Short Stay.  . If you are admitted to the hospital after surgery: o Your blood sugar will be checked by the staff and you will probably be given insulin after surgery (instead of oral diabetes medicines) to make sure you have good blood sugar levels. o The goal for blood sugar control after surgery is 80-180 mg/dL.      WHAT DO I DO ABOUT MY DIABETES MEDICATION?  Marland Kitchen Do not take oral diabetes medicines (pills) the morning of surgery.   Reviewed and Endorsed by [facility] Patient Education Committee, August 2015   Do not wear jewelry.  Do not wear lotions, powders, cologne, or deoderant.  Men may shave face and neck.  Do not bring valuables to the hospital.  [facility] is not responsible for any belongings or valuables.  Contacts, dentures or bridgework may not be worn into surgery.  Leave your suitcase in the car.  After surgery it may be brought to your room.  For patients admitted to the hospital,  discharge time will be determined by your treatment team.  Patients discharged the day of surgery will not be allowed to drive home.   Name and phone number of your driver:    Special instructions:  Shower using CHG soap the night before and the morning of your surgery  Please read over the following fact sheets that you were given.

## 2016-04-30 NOTE — Progress Notes (Signed)
Daily Session Note  Patient Details  Name: Jorge Adams MRN: [id number] Date of Birth: 03/03/1951 Referring Provider:   Flowsheet Row Cardiac Rehab from 02/20/2016 in [facility]  Referring Provider  Adrian Prows MD      Encounter Date: 04/30/2016  Check In:     Session Check In - 04/30/16 0834      Check-In   Staff Present Heath Lark, RN, BSN, CCRP;Laureen Owens Shark, BS, RRT, Respiratory Lennie Hummer, MA, ACSM RCEP, Exercise Physiologist   Supervising physician immediately available to respond to emergencies See telemetry face sheet for immediately available ER MD   Medication changes reported     No   Fall or balance concerns reported    No   Warm-up and Cool-down Performed on first and last piece of equipment   Resistance Training Performed Yes   VAD Patient? No     Pain Assessment   Currently in Pain? No/denies         Goals Met:  Independence with exercise equipment Exercise tolerated well No report of cardiac concerns or symptoms Strength training completed today  Goals Unmet:  Not Applicable  Comments: Pt able to follow exercise prescription today without complaint.  Will continue to monitor for progression.    Dr. Emily Filbert is Medical Director for [facility].

## 2016-05-01 ENCOUNTER — Encounter: Payer: Self-pay | Admitting: *Deleted

## 2016-05-01 ENCOUNTER — Encounter (HOSPITAL_COMMUNITY): Payer: Self-pay

## 2016-05-01 ENCOUNTER — Encounter (HOSPITAL_COMMUNITY)
Admission: RE | Admit: 2016-05-01 | Discharge: 2016-05-01 | Disposition: A | Discharge status: Home / Self Care | Payer: 59 | Source: Ambulatory Visit | Attending: General Surgery | Admitting: General Surgery

## 2016-05-01 DIAGNOSIS — K429 Umbilical hernia without obstruction or gangrene: Secondary | ICD-10-CM | POA: Insufficient documentation

## 2016-05-01 DIAGNOSIS — Z951 Presence of aortocoronary bypass graft: Secondary | ICD-10-CM

## 2016-05-01 DIAGNOSIS — Z01812 Encounter for preprocedural laboratory examination: Secondary | ICD-10-CM | POA: Insufficient documentation

## 2016-05-01 HISTORY — DX: Unspecified osteoarthritis, unspecified site: M19.90

## 2016-05-01 LAB — BASIC METABOLIC PANEL
ANION GAP: 9 (ref 5–15)
BUN: 7 mg/dL (ref 6–20)
CHLORIDE: 106 mmol/L (ref 101–111)
CO2: 23 mmol/L (ref 22–32)
Calcium: 9.7 mg/dL (ref 8.9–10.3)
Creatinine, Ser: 0.84 mg/dL (ref 0.61–1.24)
GFR calc Af Amer: >60 mL/min (ref >60)
GFR calc non Af Amer: >60 mL/min (ref >60)
GLUCOSE: 174 mg/dL — AB (ref 65–99)
POTASSIUM: 4.5 mmol/L (ref 3.5–5.1)
SODIUM: 138 mmol/L (ref 135–145)

## 2016-05-01 LAB — CBC
HCT: 44.4 % (ref 39.0–52.0)
HEMOGLOBIN: 13.9 g/dL (ref 13.0–17.0)
MCH: 23.9 pg — AB (ref 26.0–34.0)
MCHC: 31.3 g/dL (ref 30.0–36.0)
MCV: 76.4 fL — AB (ref 78.0–100.0)
Platelets: 136 10*3/uL — ABNORMAL LOW (ref 150–400)
RBC: 5.81 MIL/uL (ref 4.22–5.81)
RDW: 16.9 % — ABNORMAL HIGH (ref 11.5–15.5)
WBC: 6 10*3/uL (ref 4.0–10.5)

## 2016-05-01 LAB — GLUCOSE, CAPILLARY: Glucose-Capillary: 178 mg/dL — ABNORMAL HIGH (ref 65–99)

## 2016-05-01 MED ORDER — CHLORHEXIDINE GLUCONATE CLOTH 2 % EX PADS: 6.0000 | MEDICATED_PAD | Freq: Once | CUTANEOUS | Status: DC

## 2016-05-01 NOTE — Progress Notes (Addendum)
Anesthesia Chart Review: Patient is a 65 year old male scheduled for umbilical hernia repair on 05/06/16 by Dr. Georganna Skeans.  History includes MI '04, ICAD s/p CABG (LIMA-LAD, SVG-OM1, SVG-PDA) 01/02/16, post-CABG afib (converted to SR on amiodarone), former smoker, dyslipidemia, hypertension, diabetes mellitus type 2, PVD/claudication, glaucoma, dentures.  PCP is Dr. Elsie Stain. Cardiologist is Dr. Einar Gip with [facility]. Dr. Einar Gip referred patient to Dr. Ninfa Linden for consideration of hernia repair. CT surgeon is Dr. Prescott Gum.  Meds include aspirin 81 mg, brimonidine ophthalmic, Pletal, Cosopt and Travatan ophthalmic, Jardiance, TriCor, metformin, Lopressor, nitroglycerin, Crestor. He is to hold Pletal 5 days prior to surgery but continue ASA.   BP (!) 146/82   Pulse 85   Temp 36.7 C   Resp 20   Ht '5\' 11"'$  (1.803 m)   Wt 217 lb 4.8 oz (98.6 kg)   SpO2 96%   BMI 30.31 kg/m    01/05/16 EKG: Afib, non-specific T wave abnormality.  01/02/16 TEE: Study Conclusions - Left ventricle: Systolic function was normal. The estimated   ejection fraction was in the range of 50% to 55%. Wall motion was   normal; there were no regional wall motion abnormalities. - Aortic valve: No evidence of vegetation. - Mitral valve: No evidence of vegetation. - Left atrium: No evidence of thrombus in the appendage. - Right atrium: No evidence of thrombus. - Atrial septum: No defect or patent foramen ovale was identified. Impressions: - Normal study.  Last cath was on 12/21/15 prior to his CABG.   01/01/16 Carotid U/S: Findings consistent with 1- 39 percent stenosis involving the right internal carotid artery and the left internal carotid artery.   02/12/16 CXR: IMPRESSION: Further interval improvement in the appearance of the chest since the previous study with resolution of small left pleural effusion and near total resolution of patchy peripheral atelectasis. No CHF.  01/01/16 PFTs: FVC  3.51 (74%), FEV1 2.81 (80%), DLCO unc 79%.  Preoperative labs noted. A1c is still pending (5.9 on 03/01/16).   I'll revisit chart once most recent cardiology records received.  George Hugh [facility] Short Stay Center/Anesthesiology Phone [phone] 05/01/2016 4:47 PM  Addendum: I reviewed most recent records from Dr. Einar Gip. Last visit 02/23/16. EKG then showed SR, poor r wave progression, non-specific T wave abnormality. Amiodarone was discontinued. Patient has been in cardiac rehab. If no acute changes then I anticipate that he can proceed as planned. A1c yesterday was 6.1.   George Hugh [facility] Short Stay Center/Anesthesiology Phone [phone] 05/02/2016 10:20 AM

## 2016-05-01 NOTE — Progress Notes (Signed)
Cardiac Individual Treatment Plan  Patient Details  Name: Jorge Adams MRN: [id number] Date of Birth: [birth year] Referring Provider:   Flowsheet Row Cardiac Rehab from 02/20/2016 in [facility] Cardiac and Pulmonary Rehab  Referring Provider  Adrian Prows MD      Initial Encounter Date:  Flowsheet Row Cardiac Rehab from 02/20/2016 in [facility] Cardiac and Pulmonary Rehab  Date  02/20/16  Referring Provider  Adrian Prows MD      Visit Diagnosis: S/P CABG x 3  Patient's Home Medications on Admission:  Current Outpatient Prescriptions:  .  aspirin EC 81 MG tablet, Take 81 mg by mouth daily., Disp: , Rfl:  .  brimonidine (ALPHAGAN P) 0.1 % SOLN, Place 1 drop into both eyes 2 (two) times daily., Disp: , Rfl:  .  cilostazol (PLETAL) 100 MG tablet, Take 100 mg by mouth 2 (two) times daily., Disp: , Rfl: 3 .  dorzolamide-timolol (COSOPT) 22.3-6.8 MG/ML ophthalmic solution, Place 1 drop into both eyes 2 (two) times daily., Disp: , Rfl:  .  empagliflozin (JARDIANCE) 10 MG TABS tablet, Take 10 mg by mouth daily., Disp: 30 tablet, Rfl: 12 .  fenofibrate (TRICOR) 145 MG tablet, Take 145 mg by mouth daily., Disp: , Rfl:  .  metFORMIN (GLUCOPHAGE) 1000 MG tablet, TAKE 1 TABLET (1,000 MG TOTAL) BY MOUTH 2 (TWO) TIMES DAILY., Disp: 180 tablet, Rfl: 1 .  metoprolol tartrate (LOPRESSOR) 25 MG tablet, Take 25 mg by mouth 2 (two) times daily., Disp: , Rfl:  .  naproxen sodium (ANAPROX) 220 MG tablet, Take 440 mg by mouth 2 (two) times daily as needed (pain)., Disp: , Rfl:  .  nitroGLYCERIN (NITROSTAT) 0.4 MG SL tablet, PLACE 1 TABLET UNDER TONGUE EVERY 5 MINUTES AS NEEDED FOR CHEST PAIN., Disp: , Rfl: 2 .  rosuvastatin (CRESTOR) 20 MG tablet, Take 20 mg by mouth daily. , Disp: , Rfl:  .  TRAVATAN Z 0.004 % SOLN ophthalmic solution, Place 1 drop into both eyes at bedtime., Disp: , Rfl:   Past Medical History: Past Medical History:  Diagnosis Date  . ASHD (arteriosclerotic heart disease)   . Atherosclerosis of  native arteries of the extremities with intermittent claudication   . Coronary atherosclerosis of native coronary artery   . Diabetes mellitus    NIDDM  . Dyslipidemia   . ED (erectile dysfunction)   . Full dentures   . Glaucoma associated with ocular disorder, mild stage    bilateral  . Hypertension    under control, has been on med. > 9 yr.  . Impotence of organic origin   . Meniscus tear 05/2012   right knee  . Myocardial infarction 10/2002  . Stented coronary artery     Tobacco Use: History  Smoking Status  . Former Smoker  . Quit date: 12/02/2002  Smokeless Tobacco  . Never Used    Labs: Recent Review Flowsheet Data    Labs for ITP Cardiac and Pulmonary Rehab Latest Ref Rng & Units 01/04/2016 01/05/2016 01/06/2016 01/08/2016 03/01/2016   Cholestrol 0 - 200 mg/dL - - - - 130   LDLCALC 0 - 99 mg/dL - - - - 46   LDLDIRECT mg/dL - - - - -   HDL >39.00 mg/dL - - - - 46.10   Trlycerides 0.0 - 149.0 mg/dL - - 230(H) 185(H) 187.0(H)   Hemoglobin A1c 4.6 - 6.5 % - - - - 5.9   PHART 7.350 - 7.450 - - - - -   PCO2ART 35.0 -  45.0 mmHg - - - - -   HCO3 20.0 - 24.0 mEq/L - - - - -   TCO2 0 - 100 mmol/L 27 29 - - -   ACIDBASEDEF 0.0 - 2.0 mmol/L - - - - -   O2SAT % - - - - -       Exercise Target Goals:    Exercise Program Goal: Individual exercise prescription set with THRR, safety & activity barriers. Participant demonstrates ability to understand and report RPE using BORG scale, to self-measure pulse accurately, and to acknowledge the importance of the exercise prescription.  Exercise Prescription Goal: Starting with aerobic activity 30 plus minutes a day, 3 days per week for initial exercise prescription. Provide home exercise prescription and guidelines that participant acknowledges understanding prior to discharge.  Activity Barriers & Risk Stratification:     Activity Barriers & Cardiac Risk Stratification - 02/20/16 1452      Activity Barriers & Cardiac Risk  Stratification   Activity Barriers Joint Problems;Deconditioning  Hips hurt when walking. Further discussion: Md has evaluated for PAD and may plan stent placement to left leg in future.   Cardiac Risk Stratification High      6 Minute Walk:     6 Minute Walk    Row Name 02/20/16 1450 04/25/16 1031       6 Minute Walk   Phase Initial Discharge    Distance 1530 feet 1590 feet    Distance % Change  - 3.9 %  60 ft    Walk Time 6 minutes 6 minutes    # of Rest Breaks 0 0    MPH 2.9 3.01    METS 3.44 4.07    RPE 11 13    VO2 Peak 12.04 14.25    Symptoms Yes (comment) No    Comments hip pain 4/10  -    Resting HR 84 bpm 70 bpm    Resting BP 126/64 126/72    Max Ex. HR 106 bpm 122 bpm    Max Ex. BP 132/72 172/68    2 Minute Post BP 130/70  -       Initial Exercise Prescription:     Initial Exercise Prescription - 02/20/16 1400      Date of Initial Exercise RX and Referring Provider   Date 02/20/16   Referring Provider Adrian Prows MD     Treadmill   MPH 2.5   Grade 1.5   Minutes 15   METs 3.43     NuStep   Level 3   Minutes 15   METs 2     Arm Ergometer   Level 2   Minutes 15   METs 3     Prescription Details   Frequency (times per week) 2   Duration Progress to 45 minutes of aerobic exercise without signs/symptoms of physical distress     Intensity   THRR 40-80% of Max Heartrate 112-140   Ratings of Perceived Exertion 11-15   Perceived Dyspnea 0-4     Progression   Progression Continue to progress workloads to maintain intensity without signs/symptoms of physical distress.     Resistance Training   Training Prescription Yes   Weight 3 lbs   Reps 10-12      Perform Capillary Blood Glucose checks as needed.  Exercise Prescription Changes:     Exercise Prescription Changes    Row Name 02/20/16 1400 02/29/16 1400 03/13/16 1400 03/21/16 1000 03/27/16 1400     Exercise Review  Progression  - Yes Yes Yes Yes     Response to Exercise   Blood  Pressure (Admit) 124/64 134/70 130/80  - 128/64   Blood Pressure (Exercise) 132/74 140/80 144/64  - 130/64   Blood Pressure (Exit) 130/70 134/70 124/58  - 114/70   Heart Rate (Admit) 84 bpm 66 bpm 92 bpm  - 77 bpm   Heart Rate (Exercise) 106 bpm 130 bpm 126 bpm  - 128 bpm   Heart Rate (Exit) 85 bpm 83 bpm 96 bpm  - 88 bpm   Rating of Perceived Exertion (Exercise) 11 11 15   - 12   Symptoms  - none none none none   Comments  -  -  - Home Exercise Guidelines given 03/21/16 Home Exercise Guidelines given 03/21/16   Duration  - Progress to 45 minutes of aerobic exercise without signs/symptoms of physical distress Progress to 45 minutes of aerobic exercise without signs/symptoms of physical distress Progress to 45 minutes of aerobic exercise without signs/symptoms of physical distress Progress to 45 minutes of aerobic exercise without signs/symptoms of physical distress   Intensity  - THRR unchanged THRR unchanged THRR unchanged THRR unchanged     Progression   Progression  - Continue to progress workloads to maintain intensity without signs/symptoms of physical distress. Continue to progress workloads to maintain intensity without signs/symptoms of physical distress. Continue to progress workloads to maintain intensity without signs/symptoms of physical distress. Continue to progress workloads to maintain intensity without signs/symptoms of physical distress.   Average METs  - 2.6 2.8 2.8 3.1     Resistance Training   Training Prescription  - Yes Yes Yes Yes   Weight  - 3 lbs 3 lbs 3 lbs 4 lbs   Reps  - 10-12 10-15 10-15 10-15     Interval Training   Interval Training  - No No No No     Treadmill   MPH  - 2 2 2 2    Grade  - 1 1 1 1    Minutes  - 15 15 15 15    METs  - 2.81 2.81 2.81 2.81     NuStep   Level  - 3 4 4 4    Minutes  - 15 15 15 15    METs  - 2.6 3 3  3.8     Arm Ergometer   Level  - 2 2 2 2    Minutes  - 15 15 15 15    METs  - 2.4 2.6 2.6 2.7     Home Exercise Plan   Plans to  continue exercise at  -  -  - [employer] (comment)  walking at [employer] (comment)  walking at the mall   Frequency  -  -  - Add 2 additional days to program exercise sessions. Add 2 additional days to program exercise sessions.   [place] Name 04/10/16 0900 04/24/16 1500           Exercise Review   Progression Yes Yes        Response to Exercise   Blood Pressure (Admit) 124/70 124/80      Blood Pressure (Exercise) 210/100 150/70      Blood Pressure (Exit) 136/74 124/64      Heart Rate (Admit) 87 bpm 85 bpm      Heart Rate (Exercise) 121 bpm 116 bpm      Heart Rate (Exit) 89 bpm 85 bpm      Rating of Perceived Exertion (Exercise) 13 12  Symptoms none none      Comments Home Exercise Guidelines given 03/21/16 Home Exercise Guidelines given 03/21/16      Duration Progress to 45 minutes of aerobic exercise without signs/symptoms of physical distress Progress to 45 minutes of aerobic exercise without signs/symptoms of physical distress      Intensity THRR unchanged THRR unchanged        Progression   Progression Continue to progress workloads to maintain intensity without signs/symptoms of physical distress. Continue to progress workloads to maintain intensity without signs/symptoms of physical distress.      Average METs 3.47 3.2        Resistance Training   Training Prescription Yes Yes      Weight 4 lbs 4 lbs      Reps 10-15 10-15        Interval Training   Interval Training No No        Treadmill   MPH 2 2      Grade 1 1      Minutes 15 15      METs 2.81 2.81        NuStep   Level 4 4      Minutes 15 15      METs 4.6 3.5        Arm Ergometer   Level 2 2      Minutes 15 15      METs 3 3        Home Exercise Plan   Plans to continue exercise at [employer] (comment)  walking at the [employer] (comment)  walking at the mall      Frequency Add 2 additional days to program exercise sessions. Add 2 additional days to  program exercise sessions.         Exercise Comments:     Exercise Comments    Row Name 02/29/16 1448 03/13/16 1404 03/21/16 1035 03/27/16 1427 04/09/16 1032   Exercise Comments [name]'s first day of exercise was on 02/27/16.  He is off to a good start with exercise.  We will continue to monitor for progression. Marden Noble continues to do well with exercise.  He is up to 3 METs on the NuStep.  We will continue to monitor for progress. Reviewed home exercise with pt today.  Pt plans to walking at the mall for exercise.  Reviewed THR, pulse, RPE, sign and symptoms, NTG use, and when to call 911 or MD.  Also discussed weather considerations and indoor options.  Pt voiced understanding. Marden Noble is doing well in rehab.  He has been out this week on vacation.  He has made some progress.  We will continue to monitor. [name]'s blood pressure was elevated on the stepper today.  He was able to slow down and his blood pressure did come down too.  At the end of class, he mentioned that he had bruised his ribs yesterday and was having a little increased pain today.   Row Name 04/10/16 7017 04/24/16 1551         Exercise Comments Marden Noble continues to do well with exercise.  He is up to 4.6 METs on the stepper.  We will continue to monitor for progression. Marden Noble is planning to graduate at the end of next week.  He has a hernia surgery scheduled for later this month.  We will be doing his walk test at his next visit.         Discharge Exercise Prescription (Final Exercise Prescription Changes):  Exercise Prescription Changes - 04/24/16 1500      Exercise Review   Progression Yes     Response to Exercise   Blood Pressure (Admit) 124/80   Blood Pressure (Exercise) 150/70   Blood Pressure (Exit) 124/64   Heart Rate (Admit) 85 bpm   Heart Rate (Exercise) 116 bpm   Heart Rate (Exit) 85 bpm   Rating of Perceived Exertion (Exercise) 12   Symptoms none   Comments Home Exercise Guidelines given 03/21/16   Duration  Progress to 45 minutes of aerobic exercise without signs/symptoms of physical distress   Intensity THRR unchanged     Progression   Progression Continue to progress workloads to maintain intensity without signs/symptoms of physical distress.   Average METs 3.2     Resistance Training   Training Prescription Yes   Weight 4 lbs   Reps 10-15     Interval Training   Interval Training No     Treadmill   MPH 2   Grade 1   Minutes 15   METs 2.81     NuStep   Level 4   Minutes 15   METs 3.5     Arm Ergometer   Level 2   Minutes 15   METs 3     Home Exercise Plan   Plans to continue exercise at [employer] (comment)  walking at the mall   Frequency Add 2 additional days to program exercise sessions.      Nutrition:  Target Goals: Understanding of nutrition guidelines, daily intake of sodium <1569m, cholesterol <2035m calories 30% from fat and 7% or less from saturated fats, daily to have 5 or more servings of fruits and vegetables.  Biometrics:     Pre Biometrics - 02/20/16 1457      Pre Biometrics   Height 5' 10.3" (1.786 m)   Weight 215 lb 1.6 oz (97.6 kg)   Waist Circumference 43 inches   Hip Circumference 44 inches   Waist to Hip Ratio 0.98 %   BMI (Calculated) 30.7         Post Biometrics - 04/25/16 1036       Post  Biometrics   Height 5' 10.3" (1.786 m)   Weight 214 lb (97.1 kg)   Waist Circumference 42.5 inches   Hip Circumference 40.5 inches   Waist to Hip Ratio 1.05 %   BMI (Calculated) 30.5      Nutrition Therapy Plan and Nutrition Goals:     Nutrition Therapy & Goals - 02/20/16 1447      Intervention Plan   Intervention Prescribe, educate and counsel regarding individualized specific dietary modifications aiming towards targeted core components such as weight, hypertension, lipid management, diabetes, heart failure and other comorbidities.   Expected Outcomes Short Term Goal: Understand basic principles of dietary content, such as  calories, fat, sodium, cholesterol and nutrients.;Short Term Goal: A plan has been developed with personal nutrition goals set during dietitian appointment.;Long Term Goal: Adherence to prescribed nutrition plan.      Nutrition Discharge: Rate Your Plate Scores:     Nutrition Assessments - 04/30/16 1032      Rate Your Plate Scores   Pre Score 78   Pre Score % 86.6 %   Post Score 74   Post Score % 82 %   % Change -4.6 %      Nutrition Goals Re-Evaluation:   Psychosocial: Target Goals: Acknowledge presence or absence of depression, maximize coping skills, provide positive support system. Participant is  able to verbalize types and ability to use techniques and skills needed for reducing stress and depression.  Initial Review & Psychosocial Screening:     Initial Psych Review & Screening - 02/20/16 1450      Initial Review   Current issues with --  none reported     Family Dynamics   Good Support System? Yes  Wife,Children and friends     Barriers   Psychosocial barriers to participate in program There are no identifiable barriers or psychosocial needs.;The patient should benefit from training in stress management and relaxation.     Screening Interventions   Interventions Encouraged to exercise      Quality of Life Scores:     Quality of Life - 04/30/16 1034      Quality of Life Scores   Health/Function Pre 22.9 %   Health/Function Post 27.43 %   Health/Function % Change 19.78 %   Socioeconomic Pre 30 %   Socioeconomic Post 26.08 %   Socioeconomic % Change  -13.07 %   Psych/Spiritual Pre 30 %   Psych/Spiritual Post 27.43 %   Psych/Spiritual % Change -8.57 %   Family Pre 27.6 %   Family Post 27.43 %   Family % Change -0.62 %   GLOBAL Pre 26.51 %   GLOBAL Post 27.03 %   GLOBAL % Change 1.96 %      PHQ-9: Recent Review Flowsheet Data    Depression screen [facility] 2/9 04/30/2016 02/20/2016 05/29/2012   Decreased Interest 0 0 0   Down, Depressed, Hopeless 0 0  0   PHQ - 2 Score 0 0 0   Altered sleeping 0 0 -   Tired, decreased energy 0 0 -   Change in appetite 0 0 -   Feeling bad or failure about yourself  0 0 -   Trouble concentrating 0 0 -   Moving slowly or fidgety/restless 0 0 -   Suicidal thoughts 0 0 -   PHQ-9 Score 0 0 -   Difficult doing work/chores Not difficult at all Not difficult at all -      Psychosocial Evaluation and Intervention:     Psychosocial Evaluation - 02/27/16 1006      Psychosocial Evaluation & Interventions   Interventions Encouraged to exercise with the program and follow exercise prescription   Comments Counselor met with Mr. Carlisi today for initial psychosocial evaluation.  He is a 65 year old who had triple bypass surgery on 6/13. He had a  previous surgery with a stent inserted in 2004 as well.  Mr. Lemmie Evens has a strong support system with a spouse of 5 years and several adult children who live close by as well as active involvement in his local church.  Mr. Lemmie Evens has diabetes in addition to heart disease.  He states he sleeps well and has a good appetite.  He denies a history of depression or anxiety or any current symptoms.  His mood is reportedly positive most of the time and he has minimal stress in his life, other than his health.  Mr. Lemmie Evens has goals to lose weight; increase his energy; stamina; and strength while in this program.        Psychosocial Re-Evaluation:   Vocational Rehabilitation: Provide vocational rehab assistance to qualifying candidates.   Vocational Rehab Evaluation & Intervention:     Vocational Rehab - 02/20/16 1454      Initial Vocational Rehab Evaluation & Intervention   Assessment shows need for Vocational Rehabilitation No  Education: Education Goals: Education classes will be provided on a weekly basis, covering required topics. Participant will state understanding/return demonstration of topics presented.  Learning Barriers/Preferences:     Learning Barriers/Preferences  - 02/20/16 1454      Learning Barriers/Preferences   Learning Barriers None   Learning Preferences None      Education Topics: General Nutrition Guidelines/Fats and Fiber: -Group instruction provided by verbal, written material, models and posters to present the general guidelines for heart healthy nutrition. Gives an explanation and review of dietary fats and fiber. Flowsheet Row Cardiac Rehab from 04/30/2016 in [facility] Cardiac and Pulmonary Rehab  Date  04/09/16  Educator  CR  Instruction Review Code  2- meets goals/outcomes      Controlling Sodium/Reading Food Labels: -Group verbal and written material supporting the discussion of sodium use in heart healthy nutrition. Review and explanation with models, verbal and written materials for utilization of the food label. Flowsheet Row Cardiac Rehab from 04/30/2016 in [facility] Cardiac and Pulmonary Rehab  Date  04/16/16  Educator  PI  Instruction Review Code  2- meets goals/outcomes      Exercise Physiology & Risk Factors: - Group verbal and written instruction with models to review the exercise physiology of the cardiovascular system and associated critical values. Details cardiovascular disease risk factors and the goals associated with each risk factor. Flowsheet Row Cardiac Rehab from 04/30/2016 in [facility] Cardiac and Pulmonary Rehab  Date  04/23/16  Educator  [facility]  Instruction Review Code  2- meets goals/outcomes      Aerobic Exercise & Resistance Training: - Gives group verbal and written discussion on the health impact of inactivity. On the components of aerobic and resistive training programs and the benefits of this training and how to safely progress through these programs. Flowsheet Row Cardiac Rehab from 04/30/2016 in [facility] Cardiac and Pulmonary Rehab  Date  04/25/16  Educator  [facility]  Instruction Review Code  2- meets goals/outcomes      Flexibility, Balance, General Exercise Guidelines: - Provides group verbal and written  instruction on the benefits of flexibility and balance training programs. Provides general exercise guidelines with specific guidelines to those with heart or lung disease. Demonstration and skill practice provided. Flowsheet Row Cardiac Rehab from 04/30/2016 in [facility] Cardiac and Pulmonary Rehab  Date  04/30/16  Educator  [facility]  Instruction Review Code  2- meets goals/outcomes      Stress Management: - Provides group verbal and written instruction about the health risks of elevated stress, cause of high stress, and healthy ways to reduce stress. Flowsheet Row Cardiac Rehab from 04/30/2016 in [facility] Cardiac and Pulmonary Rehab  Date  03/07/16  Educator  CE  Instruction Review Code  2- meets goals/outcomes      Depression: - Provides group verbal and written instruction on the correlation between heart/lung disease and depressed mood, treatment options, and the stigmas associated with seeking treatment. Flowsheet Row Cardiac Rehab from 04/30/2016 in [facility] Cardiac and Pulmonary Rehab  Date  04/11/16  Educator  CE  Instruction Review Code  2- meets goals/outcomes      Anatomy & Physiology of the Heart: - Group verbal and written instruction and models provide basic cardiac anatomy and physiology, with the coronary electrical and arterial systems. Review of: AMI, Angina, Valve disease, Heart Failure, Cardiac Arrhythmia, Pacemakers, and the ICD. Flowsheet Row Cardiac Rehab from 04/30/2016 in [facility] Cardiac and Pulmonary Rehab  Date  03/12/16  Educator  SB  Instruction Review Code  2- meets  goals/outcomes      Cardiac Procedures: - Group verbal and written instruction and models to describe the testing methods done to diagnose heart disease. Reviews the outcomes of the test results. Describes the treatment choices: Medical Management, Angioplasty, or Coronary Bypass Surgery. Flowsheet Row Cardiac Rehab from 04/30/2016 in [facility] Cardiac and Pulmonary Rehab  Date  03/19/16  Educator  CE   Instruction Review Code  2- meets goals/outcomes      Cardiac Medications: - Group verbal and written instruction to review commonly prescribed medications for heart disease. Reviews the medication, class of the drug, and side effects. Includes the steps to properly store meds and maintain the prescription regimen. Flowsheet Row Cardiac Rehab from 04/30/2016 in [facility] Cardiac and Pulmonary Rehab  Date  03/21/16  Educator  CE  Instruction Review Code  2- meets goals/outcomes      Go Sex-Intimacy & Heart Disease, Get SMART - Goal Setting: - Group verbal and written instruction through game format to discuss heart disease and the return to sexual intimacy. Provides group verbal and written material to discuss and apply goal setting through the application of the S.M.A.R.T. Method. Flowsheet Row Cardiac Rehab from 04/30/2016 in [facility] Cardiac and Pulmonary Rehab  Date  03/19/16  Educator  CE  Instruction Review Code  2- meets goals/outcomes      Other Matters of the Heart: - Provides group verbal, written materials and models to describe Heart Failure, Angina, Valve Disease, and Diabetes in the realm of heart disease. Includes description of the disease process and treatment options available to the cardiac patient. Flowsheet Row Cardiac Rehab from 04/30/2016 in [facility] Cardiac and Pulmonary Rehab  Date  03/12/16  Educator  SB  Instruction Review Code  2- meets goals/outcomes      Exercise & Equipment Safety: - Individual verbal instruction and demonstration of equipment use and safety with use of the equipment. Flowsheet Row Cardiac Rehab from 04/30/2016 in [facility] Cardiac and Pulmonary Rehab  Date  02/20/16  Educator  SB  Instruction Review Code  2- meets goals/outcomes      Infection Prevention: - Provides verbal and written material to individual with discussion of infection control including proper hand washing and proper equipment cleaning during exercise session. Flowsheet Row  Cardiac Rehab from 04/30/2016 in [facility] Cardiac and Pulmonary Rehab  Date  02/20/16  Educator  SB  Instruction Review Code  2- meets goals/outcomes      Falls Prevention: - Provides verbal and written material to individual with discussion of falls prevention and safety.   Diabetes: - Individual verbal and written instruction to review signs/symptoms of diabetes, desired ranges of glucose level fasting, after meals and with exercise. Advice that pre and post exercise glucose checks will be done for 3 sessions at entry of program. Flowsheet Row Cardiac Rehab from 04/30/2016 in [facility] Cardiac and Pulmonary [facility]  Date  02/20/16  Educator  SB  Instruction Review Code  2- meets goals/outcomes       Knowledge Questionnaire Score:     Knowledge Questionnaire Score - 04/30/16 1032      Knowledge Questionnaire Score   Pre Score 25/28   Post Score 26/28      Core Components/Risk Factors/Patient Goals at Admission:     Personal Goals and Risk Factors at Admission - 02/20/16 1447      Core Components/Risk Factors/Patient Goals on Admission    Weight Management Weight Maintenance;Yes   Admit Weight 215 lb (97.5 kg)   Goal Weight: Short Term  210 lb (95.3 kg)   Goal Weight: Long Term 200 lb (90.7 kg)   Expected Outcomes Short Term: Continue to assess and modify interventions until short term weight is achieved;Weight Loss: Understanding of general recommendations for a balanced deficit meal plan, which promotes 1-2 lb weight loss per week and includes a negative energy balance of [phone] kcal/d;Long Term: Adherence to nutrition and physical activity/exercise program aimed toward attainment of established weight goal   Diabetes Yes   Intervention Provide education about signs/symptoms and action to take for hypo/hyperglycemia.;Provide education about proper nutrition, including hydration, and aerobic/resistive exercise prescription along with prescribed medications to achieve blood glucose  in normal ranges: Fasting glucose 65-99 mg/dL   Expected Outcomes Short Term: Participant verbalizes understanding of the signs/symptoms and immediate care of hyper/hypoglycemia, proper foot care and importance of medication, aerobic/resistive exercise and nutrition plan for blood glucose control.;Long Term: Attainment of HbA1C < 7%.  LAst HbA1C was 6.8%   Hypertension Yes   Intervention Provide education on lifestyle modifcations including regular physical activity/exercise, weight management, moderate sodium restriction and increased consumption of fresh fruit, vegetables, and low fat dairy, alcohol moderation, and smoking cessation.;Monitor prescription use compliance.   Expected Outcomes Short Term: Continued assessment and intervention until BP is < 140/84m HG in hypertensive participants. < 130/830mHG in hypertensive participants with diabetes, heart failure or chronic kidney disease.;Long Term: Maintenance of blood pressure at goal levels.   Lipids Yes   Intervention Provide education and support for participant on nutrition & aerobic/resistive exercise along with prescribed medications to achieve LDL <7068mHDL >38m60m Expected Outcomes Short Term: Participant states understanding of desired cholesterol values and is compliant with medications prescribed. Participant is following exercise prescription and nutrition guidelines.;Long Term: Cholesterol controlled with medications as prescribed, with individualized exercise RX and with personalized nutrition plan. Value goals: LDL < 70mg76mL > 40 mg.      Core Components/Risk Factors/Patient Goals Review:      Goals and Risk Factor Review    Row Name 02/27/16 1440 02/29/16 0949 03/21/16 1036 04/09/16 1026       Core Components/Risk Factors/Patient Goals Review   Personal Goals Review Weight Management/Obesity;Lipids;Hypertension;Diabetes Weight Management/Obesity;Diabetes;Hypertension;Lipids Weight Management/Obesity;Sedentary;Increase  Strength and Stamina;Heart Failure;Lipids;Hypertension;Stress Weight Management/Obesity;Sedentary;Increase Strength and Stamina;Hypertension;Heart Failure;Diabetes    Review Reviewed risk factor modification with DouglNathaneil Canaryy.  He is starting program. He has Weight goal of 200 lbs longterm 210 short term. Already stopped sodas and drinking water only. No fried, only grilled or broiled. Will set up RD appointment. Expects blood work ina week fro cholesterol followup. Compliant with medications. [name] Marden Noblen day two today.  He is feeling good with exercise.  His weight was up a 1 lb today.  He is checking his blood sugar and blood pressures at home.  He continues to work on his diet. [name] Marden Nobleoing well.  His weight was up some today, he thinks that is fluid since he did not take his Lasix yesterday.  His blood sugars and blood pressures have been good. He is taking his statin without any problems.  His stress levels have improved.  [name] Marden Nobleooking [facility] has been up and down since starting, but he is weighing daily.  His blood pressures and blood sugars have been stable.  He has not had any heart failure sysmptoms and watches his salt intake.  He is walking at home for exercise on his off days.  His  SOB has greatly improved as his strength and stamina are getting better.      Expected Outcomes COntinued compliance with medications, addition of nutriotin plan once meets with the RD and following the exercise program as prescribed to all aid in keeping  risk factors controlled and aid in weight loss goal prior to program exit. Marden Noble will continue to come to class for exercise and education.  We will continue to monitor. Marden Noble will continue to attend exercise and education classes to work on weight loss and stamina.  He will be walking at home on his days off. Marden Noble will continue to come to classes to work on his weight and strength and stamina.       Core  Components/Risk Factors/Patient Goals at Discharge (Final Review):      Goals and Risk Factor Review - 04/09/16 1026      Core Components/Risk Factors/Patient Goals Review   Personal Goals Review Weight Management/Obesity;Sedentary;Increase Strength and Stamina;Hypertension;Heart Failure;Diabetes   Review [name]'s weight has been up and down since starting, but he is weighing daily.  His blood pressures and blood sugars have been stable.  He has not had any heart failure sysmptoms and watches his salt intake.  He is walking at home for exercise on his off days.  His SOB has greatly improved as his strength and stamina are getting better.     Expected Outcomes Marden Noble will continue to come to classes to work on his weight and strength and stamina.      ITP Comments:     ITP Comments    Row Name 02/20/16 1425 03/06/16 0727 04/03/16 0639 05/01/16 [id number]     ITP Comments Medical review completed. ITP created. COntinue with ITP   Documentation od asmission diagnosis can be found [facility] 01/11/2016 Discharge Summary 30 day review. Continue with ITP unless changes noted by Medical Director at signature of review. 30 day review. Continue with ITP unless changes noted by Medical Director at signature of review. 30 day review. Continue with ITP unless changes noted by Medical Director at signature of review.        Comments:

## 2016-05-01 NOTE — Progress Notes (Signed)
PCP is Elsie Stain  Cardiologist is Clayton Lefort. LOV was in July 2017 per patient.  Patient denied having any acute cardiac or pulmonary issues.   CBG on arrival to PAT was 178, and patient stated he consumed a egg, toast, and drank water for breakfast. Blood glucose ranges from 96 to 155, and last A1c was 5.9. Patient stated he took his diabetic pills this morning.  Nurse instructed patient to stop taking Pletal five days before surgery, but he could continue taking aspirin (per Dr. Einar Gip). Patient verbalized understanding, and informed Nurse that he had already taken it today.   Will send chart to Anesthesia for review.

## 2016-05-02 DIAGNOSIS — Z951 Presence of aortocoronary bypass graft: Secondary | ICD-10-CM | POA: Diagnosis not present

## 2016-05-02 LAB — HEMOGLOBIN A1C
HEMOGLOBIN A1C: 6.1 % — AB (ref 4.8–5.6)
Mean Plasma Glucose: 128 mg/dL

## 2016-05-02 NOTE — Progress Notes (Signed)
Cardiac Individual Treatment Plan  Patient Details  Name: Jorge Adams MRN: [id number] Date of Birth: 02/21/1951 Referring Provider:   Flowsheet Row Cardiac Rehab from 02/20/2016 in [facility] Cardiac and Pulmonary Rehab  Referring Provider  Adrian Prows MD      Initial Encounter Date:  Flowsheet Row Cardiac Rehab from 02/20/2016 in [facility] Cardiac and Pulmonary Rehab  Date  02/20/16  Referring Provider  Adrian Prows MD      Visit Diagnosis: S/P CABG x 3  Patient's Home Medications on Admission:  Current Outpatient Prescriptions:  .  aspirin EC 81 MG tablet, Take 81 mg by mouth daily., Disp: , Rfl:  .  brimonidine (ALPHAGAN P) 0.1 % SOLN, Place 1 drop into both eyes 2 (two) times daily., Disp: , Rfl:  .  cilostazol (PLETAL) 100 MG tablet, Take 100 mg by mouth 2 (two) times daily., Disp: , Rfl: 3 .  dorzolamide-timolol (COSOPT) 22.3-6.8 MG/ML ophthalmic solution, Place 1 drop into both eyes 2 (two) times daily., Disp: , Rfl:  .  empagliflozin (JARDIANCE) 10 MG TABS tablet, Take 10 mg by mouth daily., Disp: 30 tablet, Rfl: 12 .  fenofibrate (TRICOR) 145 MG tablet, Take 145 mg by mouth daily., Disp: , Rfl:  .  metFORMIN (GLUCOPHAGE) 1000 MG tablet, TAKE 1 TABLET (1,000 MG TOTAL) BY MOUTH 2 (TWO) TIMES DAILY., Disp: 180 tablet, Rfl: 1 .  metoprolol tartrate (LOPRESSOR) 25 MG tablet, Take 25 mg by mouth 2 (two) times daily., Disp: , Rfl:  .  naproxen sodium (ANAPROX) 220 MG tablet, Take 440 mg by mouth 2 (two) times daily as needed (pain)., Disp: , Rfl:  .  nitroGLYCERIN (NITROSTAT) 0.4 MG SL tablet, PLACE 1 TABLET UNDER TONGUE EVERY 5 MINUTES AS NEEDED FOR CHEST PAIN., Disp: , Rfl: 2 .  rosuvastatin (CRESTOR) 20 MG tablet, Take 20 mg by mouth daily. , Disp: , Rfl:  .  TRAVATAN Z 0.004 % SOLN ophthalmic solution, Place 1 drop into both eyes at bedtime., Disp: , Rfl:   Past Medical History: Past Medical History:  Diagnosis Date  . Arthritis   . ASHD (arteriosclerotic heart disease)   .  Atherosclerosis of native arteries of the extremities with intermittent claudication   . Coronary atherosclerosis of native coronary artery   . Diabetes mellitus    NIDDM  . Dyslipidemia   . ED (erectile dysfunction)   . Full dentures   . Glaucoma associated with ocular disorder, mild stage    bilateral  . Hypertension    under control, has been on med. > 9 yr.  . Impotence of organic origin   . Meniscus tear 05/2012   right knee  . Myocardial infarction 10/2002  . Stented coronary artery     Tobacco Use: History  Smoking Status  . Former Smoker  . Quit date: 12/02/2002  Smokeless Tobacco  . Never Used    Labs: Recent Review Flowsheet Data    Labs for ITP Cardiac and Pulmonary Rehab Latest Ref Rng & Units 01/05/2016 01/06/2016 01/08/2016 03/01/2016 05/01/2016   Cholestrol 0 - 200 mg/dL - - - 130 -   LDLCALC 0 - 99 mg/dL - - - 46 -   LDLDIRECT mg/dL - - - - -   HDL >39.00 mg/dL - - - 46.10 -   Trlycerides 0.0 - 149.0 mg/dL - 230(H) 185(H) 187.0(H) -   Hemoglobin A1c 4.8 - 5.6 % - - - 5.9 6.1(H)   PHART 7.350 - 7.450 - - - - -  PCO2ART 35.0 - 45.0 mmHg - - - - -   HCO3 20.0 - 24.0 mEq/L - - - - -   TCO2 0 - 100 mmol/L 29 - - - -   ACIDBASEDEF 0.0 - 2.0 mmol/L - - - - -   O2SAT % - - - - -       Exercise Target Goals:    Exercise Program Goal: Individual exercise prescription set with THRR, safety & activity barriers. Participant demonstrates ability to understand and report RPE using BORG scale, to self-measure pulse accurately, and to acknowledge the importance of the exercise prescription.  Exercise Prescription Goal: Starting with aerobic activity 30 plus minutes a day, 3 days per week for initial exercise prescription. Provide home exercise prescription and guidelines that participant acknowledges understanding prior to discharge.  Activity Barriers & Risk Stratification:     Activity Barriers & Cardiac Risk Stratification - 02/20/16 1452      Activity  Barriers & Cardiac Risk Stratification   Activity Barriers Joint Problems;Deconditioning  Hips hurt when walking. Further discussion: Md has evaluated for PAD and may plan stent placement to left leg in future.   Cardiac Risk Stratification High      6 Minute Walk:     6 Minute Walk    Row Name 02/20/16 1450 04/25/16 1031       6 Minute Walk   Phase Initial Discharge    Distance 1530 feet 1590 feet    Distance % Change  - 3.9 %  60 ft    Walk Time 6 minutes 6 minutes    # of Rest Breaks 0 0    MPH 2.9 3.01    METS 3.44 4.07    RPE 11 13    VO2 Peak 12.04 14.25    Symptoms Yes (comment) No    Comments hip pain 4/10  -    Resting HR 84 bpm 70 bpm    Resting BP 126/64 126/72    Max Ex. HR 106 bpm 122 bpm    Max Ex. BP 132/72 172/68    2 Minute Post BP 130/70  -       Initial Exercise Prescription:     Initial Exercise Prescription - 02/20/16 1400      Date of Initial Exercise RX and Referring Provider   Date 02/20/16   Referring Provider Adrian Prows MD     Treadmill   MPH 2.5   Grade 1.5   Minutes 15   METs 3.43     NuStep   Level 3   Minutes 15   METs 2     Arm Ergometer   Level 2   Minutes 15   METs 3     Prescription Details   Frequency (times per week) 2   Duration Progress to 45 minutes of aerobic exercise without signs/symptoms of physical distress     Intensity   THRR 40-80% of Max Heartrate 112-140   Ratings of Perceived Exertion 11-15   Perceived Dyspnea 0-4     Progression   Progression Continue to progress workloads to maintain intensity without signs/symptoms of physical distress.     Resistance Training   Training Prescription Yes   Weight 3 lbs   Reps 10-12      Perform Capillary Blood Glucose checks as needed.  Exercise Prescription Changes:     Exercise Prescription Changes    Row Name 02/20/16 1400 02/29/16 1400 03/13/16 1400 03/21/16 1000 03/27/16 1400     Exercise  Review   Progression  - Yes Yes Yes Yes      Response to Exercise   Blood Pressure (Admit) 124/64 134/70 130/80  - 128/64   Blood Pressure (Exercise) 132/74 140/80 144/64  - 130/64   Blood Pressure (Exit) 130/70 134/70 124/58  - 114/70   Heart Rate (Admit) 84 bpm 66 bpm 92 bpm  - 77 bpm   Heart Rate (Exercise) 106 bpm 130 bpm 126 bpm  - 128 bpm   Heart Rate (Exit) 85 bpm 83 bpm 96 bpm  - 88 bpm   Rating of Perceived Exertion (Exercise) _0 - 12   Symptoms  - none none none none   Comments  -  -  - Home Exercise Guidelines given 03/21/16 Home Exercise Guidelines given 03/21/16   Duration  - Progress to 45 minutes of aerobic exercise without signs/symptoms of physical distress Progress to 45 minutes of aerobic exercise without signs/symptoms of physical distress Progress to 45 minutes of aerobic exercise without signs/symptoms of physical distress Progress to 45 minutes of aerobic exercise without signs/symptoms of physical distress   Intensity  - THRR unchanged THRR unchanged THRR unchanged THRR unchanged     Progression   Progression  - Continue to progress workloads to maintain intensity without signs/symptoms of physical distress. Continue to progress workloads to maintain intensity without signs/symptoms of physical distress. Continue to progress workloads to maintain intensity without signs/symptoms of physical distress. Continue to progress workloads to maintain intensity without signs/symptoms of physical distress.   Average METs  - 2.6 2.8 2.8 3.1     Resistance Training   Training Prescription  - Yes Yes Yes Yes   Weight  - 3 lbs 3 lbs 3 lbs 4 lbs   Reps  - 10-12 10-15 10-15 10-15     Interval Training   Interval Training  - No No No No     Treadmill   MPH  - _1 Grade  - _2 Minutes  - _3 METs  - 2.81 2.81 2.81 2.81     NuStep   Level  - _4 Minutes  - _5 METs  - 2._6 3.8     Arm Ergometer   Level  - _7 Minutes  - _8 METs  - 2.4 2.6 2.6 2.7      Home Exercise Plan   Plans to continue exercise at  -  -  - [employer] (comment)  walking at [employer] (comment)  walking at the mall   Frequency  -  -  - Add 2 additional days to program exercise sessions. Add 2 additional days to program exercise sessions.   [place] Name 04/10/16 0900 04/24/16 1500           Exercise Review   Progression Yes Yes        Response to Exercise   Blood Pressure (Admit) 124/70 124/80      Blood Pressure (Exercise) 210/100 150/70      Blood Pressure (Exit) 136/74 124/64      Heart Rate (Admit) 87 bpm 85 bpm      Heart Rate (Exercise) 121 bpm 116 bpm      Heart Rate (Exit) 89 bpm 85 bpm      Rating of Perceived Exertion (  Exercise) 13 12      Symptoms none none      Comments Home Exercise Guidelines given 03/21/16 Home Exercise Guidelines given 03/21/16      Duration Progress to 45 minutes of aerobic exercise without signs/symptoms of physical distress Progress to 45 minutes of aerobic exercise without signs/symptoms of physical distress      Intensity THRR unchanged THRR unchanged        Progression   Progression Continue to progress workloads to maintain intensity without signs/symptoms of physical distress. Continue to progress workloads to maintain intensity without signs/symptoms of physical distress.      Average METs 3.47 3.2        Resistance Training   Training Prescription Yes Yes      Weight 4 lbs 4 lbs      Reps 10-15 10-15        Interval Training   Interval Training No No        Treadmill   MPH 2 2      Grade 1 1      Minutes 15 15      METs 2.81 2.81        NuStep   Level 4 4      Minutes 15 15      METs 4.6 3.5        Arm Ergometer   Level 2 2      Minutes 15 15      METs 3 3        Home Exercise Plan   Plans to continue exercise at [employer] (comment)  walking at the [employer] (comment)  walking at the mall      Frequency Add 2 additional days to program exercise sessions.  Add 2 additional days to program exercise sessions.         Exercise Comments:     Exercise Comments    Row Name 02/29/16 1448 03/13/16 1404 03/21/16 1035 03/27/16 1427 04/09/16 1032   Exercise Comments [name]'s first day of exercise was on 02/27/16.  He is off to a good start with exercise.  We will continue to monitor for progression. Marden Noble continues to do well with exercise.  He is up to 3 METs on the NuStep.  We will continue to monitor for progress. Reviewed home exercise with pt today.  Pt plans to walking at the mall for exercise.  Reviewed THR, pulse, RPE, sign and symptoms, NTG use, and when to call 911 or MD.  Also discussed weather considerations and indoor options.  Pt voiced understanding. Marden Noble is doing well in rehab.  He has been out this week on vacation.  He has made some progress.  We will continue to monitor. [name]'s blood pressure was elevated on the stepper today.  He was able to slow down and his blood pressure did come down too.  At the end of class, he mentioned that he had bruised his ribs yesterday and was having a little increased pain today.   Row Name 04/10/16 7673 04/24/16 1551 05/02/16 1021       Exercise Comments Marden Noble continues to do well with exercise.  He is up to 4.6 METs on the stepper.  We will continue to monitor for progression. Marden Noble is planning to graduate at the end of next week.  He has a hernia surgery scheduled for later this month.  We will be doing his walk test at his next visit. Joao graduated today from cardiac rehab with  30 sessions completed.  Details of the patient's exercise prescription and what He needs to do in order to continue the prescription and progress were discussed with patient.  Patient was given a copy of prescription and goals.  Patient verbalized understanding.  Sonu plans to continue to exercise by walking at home and the mall.        Discharge Exercise Prescription (Final Exercise Prescription Changes):     Exercise Prescription  Changes - 04/24/16 1500      Exercise Review   Progression Yes     Response to Exercise   Blood Pressure (Admit) 124/80   Blood Pressure (Exercise) 150/70   Blood Pressure (Exit) 124/64   Heart Rate (Admit) 85 bpm   Heart Rate (Exercise) 116 bpm   Heart Rate (Exit) 85 bpm   Rating of Perceived Exertion (Exercise) 12   Symptoms none   Comments Home Exercise Guidelines given 03/21/16   Duration Progress to 45 minutes of aerobic exercise without signs/symptoms of physical distress   Intensity THRR unchanged     Progression   Progression Continue to progress workloads to maintain intensity without signs/symptoms of physical distress.   Average METs 3.2     Resistance Training   Training Prescription Yes   Weight 4 lbs   Reps 10-15     Interval Training   Interval Training No     Treadmill   MPH 2   Grade 1   Minutes 15   METs 2.81     NuStep   Level 4   Minutes 15   METs 3.5     Arm Ergometer   Level 2   Minutes 15   METs 3     Home Exercise Plan   Plans to continue exercise at [employer] (comment)  walking at the mall   Frequency Add 2 additional days to program exercise sessions.      Nutrition:  Target Goals: Understanding of nutrition guidelines, daily intake of sodium <1550m, cholesterol <2063m calories 30% from fat and 7% or less from saturated fats, daily to have 5 or more servings of fruits and vegetables.  Biometrics:     Pre Biometrics - 02/20/16 1457      Pre Biometrics   Height 5' 10.3" (1.786 m)   Weight 215 lb 1.6 oz (97.6 kg)   Waist Circumference 43 inches   Hip Circumference 44 inches   Waist to Hip Ratio 0.98 %   BMI (Calculated) 30.7         Post Biometrics - 04/25/16 1036       Post  Biometrics   Height 5' 10.3" (1.786 m)   Weight 214 lb (97.1 kg)   Waist Circumference 42.5 inches   Hip Circumference 40.5 inches   Waist to Hip Ratio 1.05 %   BMI (Calculated) 30.5      Nutrition Therapy Plan and Nutrition  Goals:     Nutrition Therapy & Goals - 02/20/16 1447      Intervention Plan   Intervention Prescribe, educate and counsel regarding individualized specific dietary modifications aiming towards targeted core components such as weight, hypertension, lipid management, diabetes, heart failure and other comorbidities.   Expected Outcomes Short Term Goal: Understand basic principles of dietary content, such as calories, fat, sodium, cholesterol and nutrients.;Short Term Goal: A plan has been developed with personal nutrition goals set during dietitian appointment.;Long Term Goal: Adherence to prescribed nutrition plan.      Nutrition Discharge: Rate Your Plate Scores:  Nutrition Assessments - 04/30/16 1032      Rate Your Plate Scores   Pre Score 78   Pre Score % 86.6 %   Post Score 74   Post Score % 82 %   % Change -4.6 %      Nutrition Goals Re-Evaluation:   Psychosocial: Target Goals: Acknowledge presence or absence of depression, maximize coping skills, provide positive support system. Participant is able to verbalize types and ability to use techniques and skills needed for reducing stress and depression.  Initial Review & Psychosocial Screening:     Initial Psych Review & Screening - 02/20/16 1450      Initial Review   Current issues with --  none reported     Family Dynamics   Good Support System? Yes  Wife,Children and friends     Barriers   Psychosocial barriers to participate in program There are no identifiable barriers or psychosocial needs.;The patient should benefit from training in stress management and relaxation.     Screening Interventions   Interventions Encouraged to exercise      Quality of Life Scores:     Quality of Life - 04/30/16 1034      Quality of Life Scores   Health/Function Pre 22.9 %   Health/Function Post 27.43 %   Health/Function % Change 19.78 %   Socioeconomic Pre 30 %   Socioeconomic Post 26.08 %   Socioeconomic % Change   -13.07 %   Psych/Spiritual Pre 30 %   Psych/Spiritual Post 27.43 %   Psych/Spiritual % Change -8.57 %   Family Pre 27.6 %   Family Post 27.43 %   Family % Change -0.62 %   GLOBAL Pre 26.51 %   GLOBAL Post 27.03 %   GLOBAL % Change 1.96 %      PHQ-9: Recent Review Flowsheet Data    Depression screen [facility] 2/9 04/30/2016 02/20/2016 05/29/2012   Decreased Interest 0 0 0   Down, Depressed, Hopeless 0 0 0   PHQ - 2 Score 0 0 0   Altered sleeping 0 0 -   Tired, decreased energy 0 0 -   Change in appetite 0 0 -   Feeling bad or failure about yourself  0 0 -   Trouble concentrating 0 0 -   Moving slowly or fidgety/restless 0 0 -   Suicidal thoughts 0 0 -   PHQ-9 Score 0 0 -   Difficult doing work/chores Not difficult at all Not difficult at all -      Psychosocial Evaluation and Intervention:     Psychosocial Evaluation - 02/27/16 1006      Psychosocial Evaluation & Interventions   Interventions Encouraged to exercise with the program and follow exercise prescription   Comments Counselor met with Mr. Labarge today for initial psychosocial evaluation.  He is a 65 year old who had triple bypass surgery on 6/13. He had a  previous surgery with a stent inserted in 2004 as well.  Mr. Lemmie Evens has a strong support system with a spouse of 5 years and several adult children who live close by as well as active involvement in his local church.  Mr. Lemmie Evens has diabetes in addition to heart disease.  He states he sleeps well and has a good appetite.  He denies a history of depression or anxiety or any current symptoms.  His mood is reportedly positive most of the time and he has minimal stress in his life, other than his health.  Mr.  H has goals to lose weight; increase his energy; stamina; and strength while in this program.        Psychosocial Re-Evaluation:   Vocational Rehabilitation: Provide vocational rehab assistance to qualifying candidates.   Vocational Rehab Evaluation & Intervention:      Vocational Rehab - 02/20/16 1454      Initial Vocational Rehab Evaluation & Intervention   Assessment shows need for Vocational Rehabilitation No      Education: Education Goals: Education classes will be provided on a weekly basis, covering required topics. Participant will state understanding/return demonstration of topics presented.  Learning Barriers/Preferences:     Learning Barriers/Preferences - 02/20/16 1454      Learning Barriers/Preferences   Learning Barriers None   Learning Preferences None      Education Topics: General Nutrition Guidelines/Fats and Fiber: -Group instruction provided by verbal, written material, models and posters to present the general guidelines for heart healthy nutrition. Gives an explanation and review of dietary fats and fiber. Flowsheet Row Cardiac Rehab from 04/30/2016 in [facility] Cardiac and Pulmonary Rehab  Date  04/09/16  Educator  CR  Instruction Review Code  2- meets goals/outcomes      Controlling Sodium/Reading Food Labels: -Group verbal and written material supporting the discussion of sodium use in heart healthy nutrition. Review and explanation with models, verbal and written materials for utilization of the food label. Flowsheet Row Cardiac Rehab from 04/30/2016 in [facility] Cardiac and Pulmonary Rehab  Date  04/16/16  Educator  PI  Instruction Review Code  2- meets goals/outcomes      Exercise Physiology & Risk Factors: - Group verbal and written instruction with models to review the exercise physiology of the cardiovascular system and associated critical values. Details cardiovascular disease risk factors and the goals associated with each risk factor. Flowsheet Row Cardiac Rehab from 04/30/2016 in [facility] Cardiac and Pulmonary Rehab  Date  04/23/16  Educator  [facility]  Instruction Review Code  2- meets goals/outcomes      Aerobic Exercise & Resistance Training: - Gives group verbal and written discussion on the health impact of  inactivity. On the components of aerobic and resistive training programs and the benefits of this training and how to safely progress through these programs. Flowsheet Row Cardiac Rehab from 04/30/2016 in [facility] Cardiac and Pulmonary Rehab  Date  04/25/16  Educator  [facility]  Instruction Review Code  2- meets goals/outcomes      Flexibility, Balance, General Exercise Guidelines: - Provides group verbal and written instruction on the benefits of flexibility and balance training programs. Provides general exercise guidelines with specific guidelines to those with heart or lung disease. Demonstration and skill practice provided. Flowsheet Row Cardiac Rehab from 04/30/2016 in [facility] Cardiac and Pulmonary Rehab  Date  04/30/16  Educator  [facility]  Instruction Review Code  2- meets goals/outcomes      Stress Management: - Provides group verbal and written instruction about the health risks of elevated stress, cause of high stress, and healthy ways to reduce stress. Flowsheet Row Cardiac Rehab from 04/30/2016 in [facility] Cardiac and Pulmonary Rehab  Date  03/07/16  Educator  CE  Instruction Review Code  2- meets goals/outcomes      Depression: - Provides group verbal and written instruction on the correlation between heart/lung disease and depressed mood, treatment options, and the stigmas associated with seeking treatment. Flowsheet Row Cardiac Rehab from 04/30/2016 in [facility] Cardiac and Pulmonary Rehab  Date  04/11/16  Educator  CE  Instruction Review Code  2- meets goals/outcomes      Anatomy & Physiology of the Heart: - Group verbal and written instruction and models provide basic cardiac anatomy and physiology, with the coronary electrical and arterial systems. Review of: AMI, Angina, Valve disease, Heart Failure, Cardiac Arrhythmia, Pacemakers, and the ICD. Flowsheet Row Cardiac Rehab from 04/30/2016 in [facility] Cardiac and Pulmonary Rehab  Date  03/12/16  Educator  SB  Instruction Review Code  2- meets  goals/outcomes      Cardiac Procedures: - Group verbal and written instruction and models to describe the testing methods done to diagnose heart disease. Reviews the outcomes of the test results. Describes the treatment choices: Medical Management, Angioplasty, or Coronary Bypass Surgery. Flowsheet Row Cardiac Rehab from 04/30/2016 in [facility] Cardiac and Pulmonary Rehab  Date  03/19/16  Educator  CE  Instruction Review Code  2- meets goals/outcomes      Cardiac Medications: - Group verbal and written instruction to review commonly prescribed medications for heart disease. Reviews the medication, class of the drug, and side effects. Includes the steps to properly store meds and maintain the prescription regimen. Flowsheet Row Cardiac Rehab from 04/30/2016 in [facility] Cardiac and Pulmonary Rehab  Date  03/21/16  Educator  CE  Instruction Review Code  2- meets goals/outcomes      Go Sex-Intimacy & Heart Disease, Get SMART - Goal Setting: - Group verbal and written instruction through game format to discuss heart disease and the return to sexual intimacy. Provides group verbal and written material to discuss and apply goal setting through the application of the S.M.A.R.T. Method. Flowsheet Row Cardiac Rehab from 04/30/2016 in [facility] Cardiac and Pulmonary Rehab  Date  03/19/16  Educator  CE  Instruction Review Code  2- meets goals/outcomes      Other Matters of the Heart: - Provides group verbal, written materials and models to describe Heart Failure, Angina, Valve Disease, and Diabetes in the realm of heart disease. Includes description of the disease process and treatment options available to the cardiac patient. Flowsheet Row Cardiac Rehab from 04/30/2016 in [facility] Cardiac and Pulmonary Rehab  Date  03/12/16  Educator  SB  Instruction Review Code  2- meets goals/outcomes      Exercise & Equipment Safety: - Individual verbal instruction and demonstration of equipment use and safety with  use of the equipment. Flowsheet Row Cardiac Rehab from 04/30/2016 in [facility] Cardiac and Pulmonary Rehab  Date  02/20/16  Educator  SB  Instruction Review Code  2- meets goals/outcomes      Infection Prevention: - Provides verbal and written material to individual with discussion of infection control including proper hand washing and proper equipment cleaning during exercise session. Flowsheet Row Cardiac Rehab from 04/30/2016 in [facility] Cardiac and Pulmonary Rehab  Date  02/20/16  Educator  SB  Instruction Review Code  2- meets goals/outcomes      Falls Prevention: - Provides verbal and written material to individual with discussion of falls prevention and safety.   Diabetes: - Individual verbal and written instruction to review signs/symptoms of diabetes, desired ranges of glucose level fasting, after meals and with exercise. Advice that pre and post exercise glucose checks will be done for 3 sessions at entry of program. Flowsheet Row Cardiac Rehab from 04/30/2016 in [facility] Cardiac [facility] Pulmonary Rehab  Date  02/20/16  Educator  SB  Instruction Review Code  2- meets goals/outcomes       Knowledge Questionnaire Score:     Knowledge Questionnaire Score - 04/30/16  1032      Knowledge Questionnaire Score   Pre Score 25/28   Post Score 26/28      Core Components/Risk Factors/Patient Goals at Admission:     Personal Goals and Risk Factors at Admission - 02/20/16 1447      Core Components/Risk Factors/Patient Goals on Admission    Weight Management Weight Maintenance;Yes   Admit Weight 215 lb (97.5 kg)   Goal Weight: Short Term 210 lb (95.3 kg)   Goal Weight: Long Term 200 lb (90.7 kg)   Expected Outcomes Short Term: Continue to assess and modify interventions until short term weight is achieved;Weight Loss: Understanding of general recommendations for a balanced deficit meal plan, which promotes 1-2 lb weight loss per week and includes a negative energy balance of [phone]  kcal/d;Long Term: Adherence to nutrition and physical activity/exercise program aimed toward attainment of established weight goal   Diabetes Yes   Intervention Provide education about signs/symptoms and action to take for hypo/hyperglycemia.;Provide education about proper nutrition, including hydration, and aerobic/resistive exercise prescription along with prescribed medications to achieve blood glucose in normal ranges: Fasting glucose 65-99 mg/dL   Expected Outcomes Short Term: Participant verbalizes understanding of the signs/symptoms and immediate care of hyper/hypoglycemia, proper foot care and importance of medication, aerobic/resistive exercise and nutrition plan for blood glucose control.;Long Term: Attainment of HbA1C < 7%.  LAst HbA1C was 6.8%   Hypertension Yes   Intervention Provide education on lifestyle modifcations including regular physical activity/exercise, weight management, moderate sodium restriction and increased consumption of fresh fruit, vegetables, and low fat dairy, alcohol moderation, and smoking cessation.;Monitor prescription use compliance.   Expected Outcomes Short Term: Continued assessment and intervention until BP is < 140/8m HG in hypertensive participants. < 130/810mHG in hypertensive participants with diabetes, heart failure or chronic kidney disease.;Long Term: Maintenance of blood pressure at goal levels.   Lipids Yes   Intervention Provide education and support for participant on nutrition & aerobic/resistive exercise along with prescribed medications to achieve LDL <7038mHDL >13m24m Expected Outcomes Short Term: Participant states understanding of desired cholesterol values and is compliant with medications prescribed. Participant is following exercise prescription and nutrition guidelines.;Long Term: Cholesterol controlled with medications as prescribed, with individualized exercise RX and with personalized nutrition plan. Value goals: LDL < 70mg74mL > 40  mg.      Core Components/Risk Factors/Patient Goals Review:      Goals and Risk Factor Review    Row Name 02/27/16 1440 02/29/16 0949 03/21/16 1036 04/09/16 1026       Core Components/Risk Factors/Patient Goals Review   Personal Goals Review Weight Management/Obesity;Lipids;Hypertension;Diabetes Weight Management/Obesity;Diabetes;Hypertension;Lipids Weight Management/Obesity;Sedentary;Increase Strength and Stamina;Heart Failure;Lipids;Hypertension;Stress Weight Management/Obesity;Sedentary;Increase Strength and Stamina;Hypertension;Heart Failure;Diabetes    Review Reviewed risk factor modification with DouglNathaneil Canaryy.  He is starting program. He has Weight goal of 200 lbs longterm 210 short term. Already stopped sodas and drinking water only. No fried, only grilled or broiled. Will set up RD appointment. Expects blood work ina week fro cholesterol followup. Compliant with medications. [name] Marden Noblen day two today.  He is feeling good with exercise.  His weight was up a 1 lb today.  He is checking his blood sugar and blood pressures at home.  He continues to work on his diet. [name] Marden Nobleoing well.  His weight was up some today, he thinks that is fluid since he did not take his Lasix yesterday.  His blood sugars and blood pressures have been good. He is taking  his statin without any problems.  His stress levels have improved.  Marden Noble is looking forward to his vacation next week at [facility].  [name]'s weight has been up and down since starting, but he is weighing daily.  His blood pressures and blood sugars have been stable.  He has not had any heart failure sysmptoms and watches his salt intake.  He is walking at home for exercise on his off days.  His SOB has greatly improved as his strength and stamina are getting better.      Expected Outcomes COntinued compliance with medications, addition of nutriotin plan once meets with the RD and following the exercise program as prescribed to all aid in keeping  risk  factors controlled and aid in weight loss goal prior to program exit. Marden Noble will continue to come to class for exercise and education.  We will continue to monitor. Marden Noble will continue to attend exercise and education classes to work on weight loss and stamina.  He will be walking at home on his days off. Marden Noble will continue to come to classes to work on his weight and strength and stamina.       Core Components/Risk Factors/Patient Goals at Discharge (Final Review):      Goals and Risk Factor Review - 04/09/16 1026      Core Components/Risk Factors/Patient Goals Review   Personal Goals Review Weight Management/Obesity;Sedentary;Increase Strength and Stamina;Hypertension;Heart Failure;Diabetes   Review [name]'s weight has been up and down since starting, but he is weighing daily.  His blood pressures and blood sugars have been stable.  He has not had any heart failure sysmptoms and watches his salt intake.  He is walking at home for exercise on his off days.  His SOB has greatly improved as his strength and stamina are getting better.     Expected Outcomes Marden Noble will continue to come to classes to work on his weight and strength and stamina.      ITP Comments:     ITP Comments    Row Name 02/20/16 1425 03/06/16 0727 04/03/16 0639 05/01/16 7209     ITP Comments Medical review completed. ITP created. COntinue with ITP   Documentation od asmission diagnosis can be found [facility] 01/11/2016 Discharge Summary 30 day review. Continue with ITP unless changes noted by Medical Director at signature of review. 30 day review. Continue with ITP unless changes noted by Medical Director at signature of review. 30 day review. Continue with ITP unless changes noted by Medical Director at signature of review.        Comments: Discharge ITP

## 2016-05-02 NOTE — Progress Notes (Signed)
Discharge Summary  Patient Details  Name: Jorge Adams MRN: [id number] Date of Birth: 18-Feb-1951 Referring Provider:   Flowsheet Row Cardiac Rehab from 02/20/2016 in [facility] Cardiac and Pulmonary Rehab  Referring Provider  Adrian Prows MD       Number of Visits: 30  Reason for Discharge:  Patient reached a stable level of exercise. Patient independent in their exercise. Early Exit:  Personal  Smoking History:  History  Smoking Status  . Former Smoker  . Quit date: 12/02/2002  Smokeless Tobacco  . Never Used    Diagnosis:  S/P CABG x 3  ADL UCSD:   Initial Exercise Prescription:     Initial Exercise Prescription - 02/20/16 1400      Date of Initial Exercise RX and Referring Provider   Date 02/20/16   Referring Provider Adrian Prows MD     Treadmill   MPH 2.5   Grade 1.5   Minutes 15   METs 3.43     NuStep   Level 3   Minutes 15   METs 2     Arm Ergometer   Level 2   Minutes 15   METs 3     Prescription Details   Frequency (times per week) 2   Duration Progress to 45 minutes of aerobic exercise without signs/symptoms of physical distress     Intensity   THRR 40-80% of Max Heartrate 112-140   Ratings of Perceived Exertion 11-15   Perceived Dyspnea 0-4     Progression   Progression Continue to progress workloads to maintain intensity without signs/symptoms of physical distress.     Resistance Training   Training Prescription Yes   Weight 3 lbs   Reps 10-12      Discharge Exercise Prescription (Final Exercise Prescription Changes):     Exercise Prescription Changes - 04/24/16 1500      Exercise Review   Progression Yes     Response to Exercise   Blood Pressure (Admit) 124/80   Blood Pressure (Exercise) 150/70   Blood Pressure (Exit) 124/64   Heart Rate (Admit) 85 bpm   Heart Rate (Exercise) 116 bpm   Heart Rate (Exit) 85 bpm   Rating of Perceived Exertion (Exercise) 12   Symptoms none   Comments Home Exercise Guidelines given  03/21/16   Duration Progress to 45 minutes of aerobic exercise without signs/symptoms of physical distress   Intensity THRR unchanged     Progression   Progression Continue to progress workloads to maintain intensity without signs/symptoms of physical distress.   Average METs 3.2     Resistance Training   Training Prescription Yes   Weight 4 lbs   Reps 10-15     Interval Training   Interval Training No     Treadmill   MPH 2   Grade 1   Minutes 15   METs 2.81     NuStep   Level 4   Minutes 15   METs 3.5     Arm Ergometer   Level 2   Minutes 15   METs 3     Home Exercise Plan   Plans to continue exercise at [employer] (comment)  walking at the mall   Frequency Add 2 additional days to program exercise sessions.      Functional Capacity:     6 Minute Walk    Row Name 02/20/16 1450 04/25/16 1031       6 Minute Walk   Phase Initial Discharge    Distance  1530 feet 1590 feet    Distance % Change  - 3.9 %  60 ft    Walk Time 6 minutes 6 minutes    # of Rest Breaks 0 0    MPH 2.9 3.01    METS 3.44 4.07    RPE 11 13    VO2 Peak 12.04 14.25    Symptoms Yes (comment) No    Comments hip pain 4/10  -    Resting HR 84 bpm 70 bpm    Resting BP 126/64 126/72    Max Ex. HR 106 bpm 122 bpm    Max Ex. BP 132/72 172/68    2 Minute Post BP 130/70  -       Psychological, QOL, Others - Outcomes: PHQ 2/9: Depression screen [facility] 2/9 04/30/2016 02/20/2016 05/29/2012  Decreased Interest 0 0 0  Down, Depressed, Hopeless 0 0 0  PHQ - 2 Score 0 0 0  Altered sleeping 0 0 -  Tired, decreased energy 0 0 -  Change in appetite 0 0 -  Feeling bad or failure about yourself  0 0 -  Trouble concentrating 0 0 -  Moving slowly or fidgety/restless 0 0 -  Suicidal thoughts 0 0 -  PHQ-9 Score 0 0 -  Difficult doing work/chores Not difficult at all Not difficult at all -    Quality of Life:     Quality of Life - 04/30/16 1034      Quality of Life Scores    Health/Function Pre 22.9 %   Health/Function Post 27.43 %   Health/Function % Change 19.78 %   Socioeconomic Pre 30 %   Socioeconomic Post 26.08 %   Socioeconomic % Change  -13.07 %   Psych/Spiritual Pre 30 %   Psych/Spiritual Post 27.43 %   Psych/Spiritual % Change -8.57 %   Family Pre 27.6 %   Family Post 27.43 %   Family % Change -0.62 %   GLOBAL Pre 26.51 %   GLOBAL Post 27.03 %   GLOBAL % Change 1.96 %      Personal Goals: Goals established at orientation with interventions provided to work toward goal.     Personal Goals and Risk Factors at Admission - 02/20/16 1447      Core Components/Risk Factors/Patient Goals on Admission    Weight Management Weight Maintenance;Yes   Admit Weight 215 lb (97.5 kg)   Goal Weight: Short Term 210 lb (95.3 kg)   Goal Weight: Long Term 200 lb (90.7 kg)   Expected Outcomes Short Term: Continue to assess and modify interventions until short term weight is achieved;Weight Loss: Understanding of general recommendations for a balanced deficit meal plan, which promotes 1-2 lb weight loss per week and includes a negative energy balance of [phone] kcal/d;Long Term: Adherence to nutrition and physical activity/exercise program aimed toward attainment of established weight goal   Diabetes Yes   Intervention Provide education about signs/symptoms and action to take for hypo/hyperglycemia.;Provide education about proper nutrition, including hydration, and aerobic/resistive exercise prescription along with prescribed medications to achieve blood glucose in normal ranges: Fasting glucose 65-99 mg/dL   Expected Outcomes Short Term: Participant verbalizes understanding of the signs/symptoms and immediate care of hyper/hypoglycemia, proper foot care and importance of medication, aerobic/resistive exercise and nutrition plan for blood glucose control.;Long Term: Attainment of HbA1C < 7%.  LAst HbA1C was 6.8%   Hypertension Yes   Intervention Provide education  on lifestyle modifcations including regular physical activity/exercise, weight management, moderate sodium restriction  and increased consumption of fresh fruit, vegetables, and low fat dairy, alcohol moderation, and smoking cessation.;Monitor prescription use compliance.   Expected Outcomes Short Term: Continued assessment and intervention until BP is < 140/3m HG in hypertensive participants. < 130/884mHG in hypertensive participants with diabetes, heart failure or chronic kidney disease.;Long Term: Maintenance of blood pressure at goal levels.   Lipids Yes   Intervention Provide education and support for participant on nutrition & aerobic/resistive exercise along with prescribed medications to achieve LDL '70mg'$ , HDL >'40mg'$ .   Expected Outcomes Short Term: Participant states understanding of desired cholesterol values and is compliant with medications prescribed. Participant is following exercise prescription and nutrition guidelines.;Long Term: Cholesterol controlled with medications as prescribed, with individualized exercise RX and with personalized nutrition plan. Value goals: LDL < '70mg'$ , HDL > 40 mg.       Personal Goals Discharge:     Goals and Risk Factor Review    Row Name 02/27/16 1440 02/29/16 0949 03/21/16 1036 04/09/16 1026       Core Components/Risk Factors/Patient Goals Review   Personal Goals Review Weight Management/Obesity;Lipids;Hypertension;Diabetes Weight Management/Obesity;Diabetes;Hypertension;Lipids Weight Management/Obesity;Sedentary;Increase Strength and Stamina;Heart Failure;Lipids;Hypertension;Stress Weight Management/Obesity;Sedentary;Increase Strength and Stamina;Hypertension;Heart Failure;Diabetes    Review Reviewed risk factor modification with DoNathaneil Canaryoday.  He is starting program. He has Weight goal of 200 lbs longterm 210 short term. Already stopped sodas and drinking water only. No fried, only grilled or broiled. Will set up RD appointment. Expects blood work  ina week fro cholesterol followup. Compliant with medications. DoMarden Nobles on day two today.  He is feeling good with exercise.  His weight was up a 1 lb today.  He is checking his blood sugar and blood pressures at home.  He continues to work on his diet. DoMarden Nobles doing well.  His weight was up some today, he thinks that is fluid since he did not take his Lasix yesterday.  His blood sugars and blood pressures have been good. He is taking his statin without any problems.  His stress levels have improved.  DoMarden Nobles [name]'s weight has been up and down since starting, but he is weighing daily.  His blood pressures and blood sugars have been stable.  He has not had any heart failure sysmptoms and watches his salt intake.  He is walking at home for exercise on his off days.  His SOB has greatly improved as his strength and stamina are getting better.      Expected Outcomes COntinued compliance with medications, addition of nutriotin plan once meets with the RD and following the exercise program as prescribed to all aid in keeping  risk factors controlled and aid in weight loss goal prior to program exit. DoMarden Nobleill continue to come to class for exercise and education.  We will continue to monitor. DoMarden Nobleill continue to attend exercise and education classes to work on weight loss and stamina.  He will be walking at home on his days off. DoMarden Nobleill continue to come to classes to work on his weight and strength and stamina.       Nutrition & Weight - Outcomes:     Pre Biometrics - 02/20/16 1457      Pre Biometrics   Height 5' 10.3" (1.786 m)   Weight 215 lb 1.6 oz (97.6 kg)   Waist Circumference 43 inches   Hip Circumference 44 inches   Waist to Hip Ratio 0.98 %   BMI (Calculated)  30.7         Post Biometrics - 04/25/16 1036       Post  Biometrics   Height 5' 10.3" (1.786 m)   Weight 214 lb (97.1 kg)   Waist Circumference 42.5 inches   Hip  Circumference 40.5 inches   Waist to Hip Ratio 1.05 %   BMI (Calculated) 30.5      Nutrition:     Nutrition Therapy & Goals - 02/20/16 1447      Intervention Plan   Intervention Prescribe, educate and counsel regarding individualized specific dietary modifications aiming towards targeted core components such as weight, hypertension, lipid management, diabetes, heart failure and other comorbidities.   Expected Outcomes Short Term Goal: Understand basic principles of dietary content, such as calories, fat, sodium, cholesterol and nutrients.;Short Term Goal: A plan has been developed with personal nutrition goals set during dietitian appointment.;Long Term Goal: Adherence to prescribed nutrition plan.      Nutrition Discharge:     Nutrition Assessments - 04/30/16 1032      Rate Your Plate Scores   Pre Score 78   Pre Score % 86.6 %   Post Score 74   Post Score % 82 %   % Change -4.6 %      Education Questionnaire Score:     Knowledge Questionnaire Score - 04/30/16 1032      Knowledge Questionnaire Score   Pre Score 25/28   Post Score 26/28      Goals reviewed with patient; copy given to patient.

## 2016-05-02 NOTE — Progress Notes (Signed)
Daily Session Note  Patient Details  Name: Jorge Adams MRN: [id number] Date of Birth: [birth year] Referring Provider:   Flowsheet Row Cardiac Rehab from 02/20/2016 in [facility] Cardiac and Pulmonary Rehab  Referring Provider  Adrian Prows MD      Encounter Date: 05/02/2016  Check In:     Session Check In - 05/02/16 0846      Check-In   Staff Present Alberteen Sam, MA, ACSM RCEP, Exercise Physiologist;Markeshia Giebel Oletta Darter, BA, ACSM CEP, Exercise Physiologist;Carroll Enterkin, RN, BSN   Supervising physician immediately available to respond to emergencies See telemetry face sheet for immediately available ER MD   Medication changes reported     No   Fall or balance concerns reported    No   Warm-up and Cool-down Performed on first and last piece of equipment   Resistance Training Performed Yes   VAD Patient? No     Pain Assessment   Currently in Pain? No/denies   Multiple Pain Sites No         Goals Met:  Independence with exercise equipment Exercise tolerated well No report of cardiac concerns or symptoms Strength training completed today  Goals Unmet:  Not Applicable  Comments: Pt able to follow exercise prescription today without complaint.  Will continue to monitor for progression.  Jorge Adams graduated today from cardiac rehab with 30 sessions completed.  Details of the patient's exercise prescription and what He needs to do in order to continue the prescription and progress were discussed with patient.  Patient was given a copy of prescription and goals.  Patient verbalized understanding.  Jorge Adams plans to continue to exercise by walking at home and the mall.    Dr. Emily Filbert is Medical Director for [facility].

## 2016-05-05 NOTE — Anesthesia Preprocedure Evaluation (Addendum)
Anesthesia Evaluation  Patient identified by MRN, date of birth, ID band Patient awake    Reviewed: Allergy & Precautions, H&P , NPO status , Patient's Chart, lab work & pertinent test results, reviewed documented beta blocker date and time   Airway Mallampati: III  TM Distance: >3 FB Neck ROM: Full    Dental no notable dental hx. (+) Upper Dentures, Lower Dentures, Dental Advisory Given   Pulmonary neg pulmonary ROS, former smoker,    Pulmonary exam normal breath sounds clear to auscultation       Cardiovascular hypertension, Pt. on medications and Pt. on home beta blockers + CAD, + Cardiac Stents, + CABG and + Peripheral Vascular Disease   Rhythm:Regular Rate:Normal     Neuro/Psych negative neurological ROS  negative psych ROS   GI/Hepatic negative GI ROS, Neg liver ROS,   Endo/Other  diabetes, Type 2, Oral Hypoglycemic Agents  Renal/GU negative Renal ROS  negative genitourinary   Musculoskeletal  (+) Arthritis , Osteoarthritis,    Abdominal   Peds  Hematology negative hematology ROS (+)   Anesthesia Other Findings   Reproductive/Obstetrics negative OB ROS                            Anesthesia Physical Anesthesia Plan  ASA: III  Anesthesia Plan: General   Post-op Pain Management:    Induction: Intravenous  Airway Management Planned: Oral ETT  Additional Equipment:   Intra-op Plan:   Post-operative Plan: Extubation in OR  Informed Consent: I have reviewed the patients History and Physical, chart, labs and discussed the procedure including the risks, benefits and alternatives for the proposed anesthesia with the patient or authorized representative who has indicated his/her understanding and acceptance.   Dental advisory given  Plan Discussed with: CRNA  Anesthesia Plan Comments:         Anesthesia Quick Evaluation

## 2016-05-06 ENCOUNTER — Ambulatory Visit (HOSPITAL_COMMUNITY)
Admission: RE | Admit: 2016-05-06 | Discharge: 2016-05-06 | Disposition: A | Discharge status: Home / Self Care | Payer: 59 | Source: Ambulatory Visit | Attending: General Surgery | Admitting: General Surgery

## 2016-05-06 ENCOUNTER — Ambulatory Visit (HOSPITAL_COMMUNITY): Payer: 59 | Admitting: Anesthesiology

## 2016-05-06 ENCOUNTER — Encounter (HOSPITAL_COMMUNITY): Payer: Self-pay | Admitting: Certified Registered Nurse Anesthetist

## 2016-05-06 ENCOUNTER — Ambulatory Visit (HOSPITAL_COMMUNITY): Payer: 59 | Admitting: Vascular Surgery

## 2016-05-06 ENCOUNTER — Encounter (HOSPITAL_COMMUNITY)
Admission: RE | Disposition: A | Discharge status: Home / Self Care | Payer: Self-pay | Source: Ambulatory Visit | Attending: General Surgery

## 2016-05-06 DIAGNOSIS — Z79899 Other long term (current) drug therapy: Secondary | ICD-10-CM | POA: Insufficient documentation

## 2016-05-06 DIAGNOSIS — I2510 Atherosclerotic heart disease of native coronary artery without angina pectoris: Secondary | ICD-10-CM | POA: Diagnosis not present

## 2016-05-06 DIAGNOSIS — Z809 Family history of malignant neoplasm, unspecified: Secondary | ICD-10-CM | POA: Diagnosis not present

## 2016-05-06 DIAGNOSIS — E119 Type 2 diabetes mellitus without complications: Secondary | ICD-10-CM | POA: Diagnosis not present

## 2016-05-06 DIAGNOSIS — Z951 Presence of aortocoronary bypass graft: Secondary | ICD-10-CM | POA: Diagnosis not present

## 2016-05-06 DIAGNOSIS — Z955 Presence of coronary angioplasty implant and graft: Secondary | ICD-10-CM | POA: Insufficient documentation

## 2016-05-06 DIAGNOSIS — Z87891 Personal history of nicotine dependence: Secondary | ICD-10-CM | POA: Diagnosis not present

## 2016-05-06 DIAGNOSIS — I10 Essential (primary) hypertension: Secondary | ICD-10-CM | POA: Diagnosis not present

## 2016-05-06 DIAGNOSIS — E7800 Pure hypercholesterolemia, unspecified: Secondary | ICD-10-CM | POA: Diagnosis not present

## 2016-05-06 DIAGNOSIS — K429 Umbilical hernia without obstruction or gangrene: Secondary | ICD-10-CM | POA: Diagnosis present

## 2016-05-06 DIAGNOSIS — I4891 Unspecified atrial fibrillation: Secondary | ICD-10-CM | POA: Insufficient documentation

## 2016-05-06 DIAGNOSIS — Z8249 Family history of ischemic heart disease and other diseases of the circulatory system: Secondary | ICD-10-CM | POA: Diagnosis not present

## 2016-05-06 DIAGNOSIS — M1990 Unspecified osteoarthritis, unspecified site: Secondary | ICD-10-CM | POA: Diagnosis not present

## 2016-05-06 DIAGNOSIS — Z833 Family history of diabetes mellitus: Secondary | ICD-10-CM | POA: Diagnosis not present

## 2016-05-06 DIAGNOSIS — I739 Peripheral vascular disease, unspecified: Secondary | ICD-10-CM | POA: Insufficient documentation

## 2016-05-06 DIAGNOSIS — Z86010 Personal history of colonic polyps: Secondary | ICD-10-CM | POA: Insufficient documentation

## 2016-05-06 HISTORY — PX: UMBILICAL HERNIA REPAIR: SHX196

## 2016-05-06 HISTORY — PX: INSERTION OF MESH: SHX5868

## 2016-05-06 LAB — GLUCOSE, CAPILLARY
GLUCOSE-CAPILLARY: 101 mg/dL — AB (ref 65–99)
GLUCOSE-CAPILLARY: 120 mg/dL — AB (ref 65–99)

## 2016-05-06 SURGERY — REPAIR, HERNIA, UMBILICAL, ADULT
Anesthesia: General

## 2016-05-06 MED ORDER — ROCURONIUM BROMIDE 100 MG/10ML IV SOLN
INTRAVENOUS | Status: DC | PRN
  Administered 2016-05-06: 50 mg via INTRAVENOUS

## 2016-05-06 MED ORDER — LIDOCAINE HCL (CARDIAC) 20 MG/ML IV SOLN
INTRAVENOUS | Status: DC | PRN
  Administered 2016-05-06: 60 mg via INTRATRACHEAL

## 2016-05-06 MED ORDER — GLYCOPYRROLATE 0.2 MG/ML IV SOSY
PREFILLED_SYRINGE | INTRAVENOUS | Status: AC
  Filled 2016-05-06: qty 3

## 2016-05-06 MED ORDER — ONDANSETRON HCL 4 MG/2ML IJ SOLN
INTRAMUSCULAR | Status: DC | PRN
  Administered 2016-05-06: 4 mg via INTRAVENOUS

## 2016-05-06 MED ORDER — LIDOCAINE 2% (20 MG/ML) 5 ML SYRINGE
INTRAMUSCULAR | Status: AC
  Filled 2016-05-06: qty 5

## 2016-05-06 MED ORDER — HYDROMORPHONE HCL 1 MG/ML IJ SOLN
INTRAMUSCULAR | Status: AC
  Filled 2016-05-06: qty 1

## 2016-05-06 MED ORDER — HYDROMORPHONE HCL 1 MG/ML IJ SOLN
0.2500 mg | INTRAMUSCULAR | Status: DC | PRN
  Administered 2016-05-06: 0.5 mg via INTRAVENOUS

## 2016-05-06 MED ORDER — BUPIVACAINE-EPINEPHRINE (PF) 0.5% -1:200000 IJ SOLN
INTRAMUSCULAR | Status: AC
  Filled 2016-05-06: qty 30

## 2016-05-06 MED ORDER — ONDANSETRON HCL 4 MG/2ML IJ SOLN
INTRAMUSCULAR | Status: AC
  Filled 2016-05-06: qty 2

## 2016-05-06 MED ORDER — 0.9 % SODIUM CHLORIDE (POUR BTL) OPTIME
TOPICAL | Status: DC | PRN
  Administered 2016-05-06: 1000 mL

## 2016-05-06 MED ORDER — CEFAZOLIN SODIUM-DEXTROSE 2-4 GM/100ML-% IV SOLN
2.0000 g | INTRAVENOUS | Status: AC
  Administered 2016-05-06: 2 g via INTRAVENOUS
  Filled 2016-05-06: qty 100

## 2016-05-06 MED ORDER — ROCURONIUM BROMIDE 10 MG/ML (PF) SYRINGE
PREFILLED_SYRINGE | INTRAVENOUS | Status: AC
  Filled 2016-05-06: qty 10

## 2016-05-06 MED ORDER — PROPOFOL 10 MG/ML IV BOLUS
INTRAVENOUS | Status: AC
  Filled 2016-05-06: qty 20

## 2016-05-06 MED ORDER — PHENYLEPHRINE 40 MCG/ML (10ML) SYRINGE FOR IV PUSH (FOR BLOOD PRESSURE SUPPORT)
PREFILLED_SYRINGE | INTRAVENOUS | Status: AC
  Filled 2016-05-06: qty 10

## 2016-05-06 MED ORDER — FENTANYL CITRATE (PF) 100 MCG/2ML IJ SOLN
INTRAMUSCULAR | Status: AC
  Filled 2016-05-06: qty 2

## 2016-05-06 MED ORDER — SUGAMMADEX SODIUM 200 MG/2ML IV SOLN
INTRAVENOUS | Status: AC
  Filled 2016-05-06: qty 2

## 2016-05-06 MED ORDER — MIDAZOLAM HCL 2 MG/2ML IJ SOLN
INTRAMUSCULAR | Status: DC | PRN
  Administered 2016-05-06 (×2): 1 mg via INTRAVENOUS

## 2016-05-06 MED ORDER — OXYCODONE HCL 5 MG PO TABS: 5.0000 mg | ORAL_TABLET | Freq: Four times a day (QID) | ORAL | 0 refills | Status: DC | PRN

## 2016-05-06 MED ORDER — GLYCOPYRROLATE 0.2 MG/ML IJ SOLN
INTRAMUSCULAR | Status: DC | PRN
  Administered 2016-05-06: 0.2 mg via INTRAVENOUS

## 2016-05-06 MED ORDER — SUGAMMADEX SODIUM 200 MG/2ML IV SOLN
INTRAVENOUS | Status: DC | PRN
  Administered 2016-05-06: 200 mg via INTRAVENOUS

## 2016-05-06 MED ORDER — LACTATED RINGERS IV SOLN
INTRAVENOUS | Status: DC | PRN
Start: 2016-05-06 — End: 2016-05-06
  Administered 2016-05-06 (×2): via INTRAVENOUS

## 2016-05-06 MED ORDER — FENTANYL CITRATE (PF) 100 MCG/2ML IJ SOLN
INTRAMUSCULAR | Status: DC | PRN
  Administered 2016-05-06: 100 ug via INTRAVENOUS

## 2016-05-06 MED ORDER — BUPIVACAINE-EPINEPHRINE 0.5% -1:200000 IJ SOLN
INTRAMUSCULAR | Status: DC | PRN
  Administered 2016-05-06: 15 mL

## 2016-05-06 MED ORDER — MIDAZOLAM HCL 2 MG/2ML IJ SOLN
INTRAMUSCULAR | Status: AC
  Filled 2016-05-06: qty 2

## 2016-05-06 MED ORDER — PROPOFOL 10 MG/ML IV BOLUS
INTRAVENOUS | Status: DC | PRN
  Administered 2016-05-06: 130 mg via INTRAVENOUS

## 2016-05-06 MED ORDER — PHENYLEPHRINE HCL 10 MG/ML IJ SOLN
INTRAMUSCULAR | Status: DC | PRN
  Administered 2016-05-06: 80 ug via INTRAVENOUS

## 2016-05-06 SURGICAL SUPPLY — 33 items
BLADE SURG ROTATE 9660 (MISCELLANEOUS) ×2 IMPLANT
CANISTER SUCTION 2500CC (MISCELLANEOUS) ×2 IMPLANT
CHLORAPREP W/TINT 26ML (MISCELLANEOUS) ×2 IMPLANT
COVER SURGICAL LIGHT HANDLE (MISCELLANEOUS) ×2 IMPLANT
DERMABOND ADVANCED (GAUZE/BANDAGES/DRESSINGS) ×1
DERMABOND ADVANCED .7 DNX12 (GAUZE/BANDAGES/DRESSINGS) ×1 IMPLANT
DRAPE LAPAROTOMY T 98X78 PEDS (DRAPES) ×2 IMPLANT
DRAPE UTILITY XL STRL (DRAPES) ×2 IMPLANT
ELECT REM PT RETURN 9FT ADLT (ELECTROSURGICAL) ×2
ELECTRODE REM PT RTRN 9FT ADLT (ELECTROSURGICAL) ×1 IMPLANT
GAUZE SPONGE 4X4 16PLY XRAY LF (GAUZE/BANDAGES/DRESSINGS) ×2 IMPLANT
GLOVE BIO SURGEON STRL SZ8 (GLOVE) ×2 IMPLANT
GLOVE BIOGEL PI IND STRL 8 (GLOVE) ×1 IMPLANT
GLOVE BIOGEL PI INDICATOR 8 (GLOVE) ×1
GOWN STRL REUS W/ TWL LRG LVL3 (GOWN DISPOSABLE) ×1 IMPLANT
GOWN STRL REUS W/ TWL XL LVL3 (GOWN DISPOSABLE) ×1 IMPLANT
GOWN STRL REUS W/TWL LRG LVL3 (GOWN DISPOSABLE) ×1
GOWN STRL REUS W/TWL XL LVL3 (GOWN DISPOSABLE) ×1
KIT BASIN OR (CUSTOM PROCEDURE TRAY) ×2 IMPLANT
KIT ROOM TURNOVER OR (KITS) ×2 IMPLANT
MESH VENTRALEX ST 2.5 CRC MED (Mesh General) ×2 IMPLANT
NEEDLE 22X1 1/2 (OR ONLY) (NEEDLE) ×2 IMPLANT
NS IRRIG 1000ML POUR BTL (IV SOLUTION) ×2 IMPLANT
PACK GENERAL/GYN (CUSTOM PROCEDURE TRAY) ×2 IMPLANT
PAD ARMBOARD 7.5X6 YLW CONV (MISCELLANEOUS) ×2 IMPLANT
SUT MNCRL AB 4-0 PS2 18 (SUTURE) ×2 IMPLANT
SUT PROLENE 0 CT 1 30 (SUTURE) ×4 IMPLANT
SUT VIC AB 2-0 CT1 27 (SUTURE) ×1
SUT VIC AB 2-0 CT1 TAPERPNT 27 (SUTURE) ×1 IMPLANT
SUT VIC AB 3-0 SH 27 (SUTURE) ×1
SUT VIC AB 3-0 SH 27XBRD (SUTURE) ×1 IMPLANT
SYR CONTROL 10ML LL (SYRINGE) ×2 IMPLANT
TOWEL OR 17X24 6PK STRL BLUE (TOWEL DISPOSABLE) ×2 IMPLANT

## 2016-05-06 NOTE — H&P (Signed)
Montie Swiderski 04/17/2016 9:25 AM Location: [facility] Patient #: [id number] DOB: November 06, 1950 Married / Language: Cleophus Molt / Race: White Male   History of Present Illness Lavone Neri E. Grandville Silos MD; 04/17/2016 10:01 AM) The patient is a 65 year old male who presents with an umbilical hernia. I was asked to see [place] by Dr. Einar Gip in consultation for consideration of repair of an umbilical hernia. He has had this for many years. It is gradually getting larger. It does not seem to totally go back in at night when he lays down. He does not have a lot of pain associated with it. Of note, he underwent coronary artery bypass grafting in June and has been working with cardiac rehabilitation. He is doing very well from that standpoint. He is not on any blood thinners.   Other Problems Marjean Donna, CMA; 04/17/2016 9:26 AM) Atrial Fibrillation Diabetes Mellitus High blood pressure Hypercholesterolemia Umbilical Hernia Repair  Past Surgical History Marjean Donna, CMA; 04/17/2016 9:26 AM) Bypass Surgery for Poor Blood Flow to Legs Colon Polyp Removal - Colonoscopy Coronary Artery Bypass Graft Knee Surgery Right. Oral Surgery  Diagnostic Studies History Marjean Donna, CMA; 04/17/2016 9:26 AM) Colonoscopy 1-5 years ago  Allergies Davy Pique Bynum, [place]; 04/17/2016 9:27 AM) No Known Drug Allergies09/27/2017  Medication History (Sonya Bynum, CMA; 04/17/2016 9:28 AM) Alphagan P (0.1% Solution, Ophthalmic) Active. Amiodarone HCl ('200MG'$  Tablet, Oral) Active. Fenofibrate ('145MG'$  Tablet, Oral) Active. Furosemide ('40MG'$  Tablet, Oral) Active. Jardiance ('10MG'$  Tablet, Oral) Active. Klor-Con M20 [facility] Tablet ER, Oral) Active. MetFORMIN HCl ('1000MG'$  Tablet, Oral) Active. Nitroglycerin (0.'4MG'$  Tab Sublingual, Sublingual as needed) Active. Rosuvastatin Calcium ('20MG'$  Tablet, Oral) Active. Travatan Z (0.004% Solution, Ophthalmic) Active. Metoprolol Tartrate ('25MG'$  Tablet, Oral)  Active. Dorzolamide HCl-Timolol Mal (22.3-6.'8MG'$ /ML Solution, Ophthalmic) Active. Amlodipine-Olmesartan (5-'40MG'$  Tablet, Oral) Active. Cilostazol ('100MG'$  Tablet, Oral) Active. Medications Reconciled  Social History Marjean Donna, CMA; 04/17/2016 9:26 AM) Alcohol use Occasional alcohol use. Caffeine use Coffee. No drug use Tobacco use Former smoker.  Family History Marjean Donna, [place]; 04/17/2016 9:26 AM) Cancer Mother. Diabetes Mellitus Brother. Heart Disease Father. Hypertension Father.    Review of Systems Davy Pique Bynum CMA; 04/17/2016 9:26 AM) General Not Present- Appetite Loss, Chills, Fatigue, Fever, Night Sweats, Weight Gain and Weight Loss. Skin Not Present- Change in Wart/Mole, Dryness, Hives, Jaundice, New Lesions, Non-Healing Wounds, Rash and Ulcer. HEENT Present- Wears glasses/contact lenses. Not Present- Earache, Hearing Loss, Hoarseness, Nose Bleed, Oral Ulcers, Ringing in the Ears, Seasonal Allergies, Sinus Pain, Sore Throat, Visual Disturbances and Yellow Eyes. Breast Not Present- Breast Mass, Breast Pain, Nipple Discharge and Skin Changes. Cardiovascular Not Present- Chest Pain, Difficulty Breathing Lying Down, Leg Cramps, Palpitations, Rapid Heart Rate, Shortness of Breath and Swelling of Extremities. Gastrointestinal Not Present- Abdominal Pain, Bloating, Bloody Stool, Change in Bowel Habits, Chronic diarrhea, Constipation, Difficulty Swallowing, Excessive gas, Gets full quickly at meals, Hemorrhoids, Indigestion, Nausea, Rectal Pain and Vomiting. Male Genitourinary Present- Impotence. Not Present- Blood in Urine, Change in Urinary Stream, Frequency, Nocturia, Painful Urination, Urgency and Urine Leakage. Musculoskeletal Not Present- Back Pain, Joint Pain, Joint Stiffness, Muscle Pain, Muscle Weakness and Swelling of Extremities. Neurological Not Present- Decreased Memory, Fainting, Headaches, Numbness, Seizures, Tingling, Tremor, Trouble walking and  Weakness. Psychiatric Not Present- Anxiety, Bipolar, Change in Sleep Pattern, Depression, Fearful and Frequent crying. Endocrine Not Present- Cold Intolerance, Excessive Hunger, Hair Changes, Heat Intolerance, Hot flashes and New Diabetes. Hematology Not Present- Blood Thinners, Easy Bruising, Excessive bleeding, Gland problems, HIV and Persistent Infections.  Vitals Davy Pique Bynum CMA; 04/17/2016 9:27  AM) 04/17/2016 9:26 AM Weight: 213 lb Height: 71in Body Surface Area: 2.17 m Body Mass Index: 29.71 kg/m  Temp.: 30F(Temporal)  Pulse: 76 (Regular)  BP: 128/74 (Sitting, Left Arm, Standard)       Physical Exam Lavone Neri E. Grandville Silos MD; 04/17/2016 10:03 AM) General Note: No distress   Integumentary Note: Some dry skin on the trunk   Head and Neck Note: Normocephalic, neck is supple with no masses   Eye Note: Wears glasses pupils are equal and reactive   ENMT Note: Ears are normal externally, nose is normal, mucosa is moist   Chest and Lung Exam Note: Healed midline sternotomy scar, lungs clear to auscultation   Cardiovascular Note: Regular rate and rhythm, no significant peripheral edema   Abdomen Note: Soft and nontender, umbilical hernia is present which is partially but not completely reducible. No pain on examination, no other hernias or masses felt   Neurologic Note: Awake and alert, normal gait, moves all extremities well, speech fluent   Neuropsychiatric Note: Normal mood and affect   Musculoskeletal Note: Healed incisions along his right thigh medially     Assessment & Plan Lavone Neri E. Grandville Silos MD; 0/27/7412 87:86 AM) UMBILICAL HERNIA WITHOUT OBSTRUCTION OR GANGRENE (K42.9) Impression: I have offered elective repair with mesh as an outpatient surgical procedure. His cardiac status is good according to Dr. Einar Gip status post coronary artery bypass grafting. Procedure, risks, and benefits were discussed in detail with him and his  wife. Answered his questions. He is agreeable.

## 2016-05-06 NOTE — Anesthesia Postprocedure Evaluation (Signed)
Anesthesia Post Note  Patient: CONAN MCMANAWAY  Procedure(s) Performed: Procedure(s) (LRB): HERNIA REPAIR UMBILICAL ADULT (N/A) INSERTION OF MESH (N/A)  Patient location during evaluation: PACU Anesthesia Type: General Level of consciousness: awake and alert Pain management: pain level controlled Vital Signs Assessment: post-procedure vital signs reviewed and stable Respiratory status: spontaneous breathing, nonlabored ventilation and respiratory function stable Cardiovascular status: blood pressure returned to baseline and stable Postop Assessment: no signs of nausea or vomiting Anesthetic complications: no    Last Vitals:  Vitals:   05/06/16 0930 05/06/16 0948  BP: (!) 144/78 (!) 154/85  Pulse: 63 (!) 57  Resp: 13 16  Temp: 36.6 C     Last Pain:  Vitals:   05/06/16 0948  TempSrc:   PainSc: 1                  Ayiana Winslett,W. EDMOND

## 2016-05-06 NOTE — Anesthesia Procedure Notes (Signed)
Date/Time: 05/06/2016 7:34 AM Performed by: Roderic Palau Pre-anesthesia Checklist: Patient identified, Emergency Drugs available, Suction available and Patient being monitored Patient Re-evaluated:Patient Re-evaluated prior to inductionOxygen Delivery Method: Circle system utilized Preoxygenation: Pre-oxygenation with 100% oxygen Intubation Type: IV induction Ventilation: Oral airway inserted - appropriate to patient size Laryngoscope Size: Mac and 4 Grade View: Grade I Tube type: Oral Number of attempts: 1 Airway Equipment and Method: Stylet Placement Confirmation: ETT inserted through vocal cords under direct vision,  positive ETCO2 and breath sounds checked- equal and bilateral Secured at: 23 cm Tube secured with: Tape Dental Injury: Teeth and Oropharynx as per pre-operative assessment

## 2016-05-06 NOTE — Op Note (Signed)
05/06/2016  8:17 AM  PATIENT:  Jorge Adams  65 y.o. male  PRE-OPERATIVE DIAGNOSIS:  UMBILICAL HERNIA  POST-OPERATIVE DIAGNOSIS:  UMBILICAL HERNIA  PROCEDURE:  Procedure(s): HERNIA REPAIR UMBILICAL ADULT INSERTION OF MESH  SURGEON:  Surgeon(s): Georganna Skeans, MD  ASSISTANTS: none   ANESTHESIA:   local and general  EBL:  No intake/output data recorded.  BLOOD ADMINISTERED:none  DRAINS: none   SPECIMEN:  No Specimen  DISPOSITION OF SPECIMEN:  N/A  COUNTS:  YES  DICTATION: .Dragon Dictation Tarry presents for umbilical hernia repair with mesh. He was identified in the preop holding area. Informed consent was obtained. He received intravenous antibiotics. He was brought to the operating room and general endotracheal anesthesia was administered by the anesthesia staff. His abdomen was prepped and draped in a sterile fashion. Time out procedure was performed. Local was injected in the infraumbilical region. Infraumbilical incision was made. Subcutaneous tissues were dissected down and the umbilicus was circumferentially dissected. Umbilical skin was carefully dissected off of the hernia sac. Hernia sac was entered and it just contained omentum. The omentum was freed up off the sac. The sac was excised and discarded. Hemostasis was obtained the omentum it was returned to the abdomen. The fascia was cleared off circumferentially. Hernia repair was completed with a 6.4 cm Bard mesh. This was inserted into the abdomen the 2 leaflets were pulled up. It laid nicely Against the abdominal wall. The superior leaflet and inferior leaflet were tacked to the fascia with interrupted 0 Prolene sutures. The leaflets were trimmed. An additional suture was placed to secure the fascia transversely down to the mesh. This covered the mesh with the fascia. Wound was copiously irrigated. Hemostasis was ensured. Umbilical skin was tacked back down to underlying tissues with interrupted 2-0 Vicryl.  Subcutaneous tissues were closed with interrupted 3-0 Vicryl. Skin was closed with running 4-0 Monocryl followed by Dermabond. All counts were correct. He tolerated procedure well without apparent complication was taken recovery in stable condition. PATIENT DISPOSITION:  PACU - hemodynamically stable.   Delay start of Pharmacological VTE agent (>24hrs) due to surgical blood loss or risk of bleeding:  no  Georganna Skeans, MD, MPH, FACS Pager: [phone]  10/16/20178:17 AM

## 2016-05-06 NOTE — Interval H&P Note (Signed)
History and Physical Interval Note:  05/06/2016 6:54 AM  Jorge Adams  has presented today for surgery, with the diagnosis of UMBILICAL HERNIA  The various methods of treatment have been discussed with the patient and family. After consideration of risks, benefits and other options for treatment, the patient has consented to  Procedure(s): HERNIA REPAIR UMBILICAL ADULT (N/A) INSERTION OF MESH (N/A) as a surgical intervention .  The patient's history has been reviewed, patient examined, no change in status, stable for surgery.  I have reviewed the patient's chart and labs.  Questions were answered to the patient's satisfaction.     [name]

## 2016-05-06 NOTE — Transfer of Care (Signed)
Immediate Anesthesia Transfer of Care Note  Patient: Jorge Adams  Procedure(s) Performed: Procedure(s): HERNIA REPAIR UMBILICAL ADULT (N/A) INSERTION OF MESH (N/A)  Patient Location: PACU  Anesthesia Type:General  Level of Consciousness: awake, alert  and patient cooperative  Airway & Oxygen Therapy: Patient Spontanous Breathing and Patient connected to nasal cannula oxygen  Post-op Assessment: Report given to RN, Post -op Vital signs reviewed and stable, Patient moving all extremities X 4 and Patient able to stick tongue midline  Post vital signs: Reviewed and stable  Last Vitals:  Vitals:   05/06/16 0648  BP: (!) 161/94  Pulse: 63  Resp: 16  Temp: 36.9 C    Last Pain:  Vitals:   05/06/16 0648  TempSrc: Oral         Complications: No apparent anesthesia complications

## 2016-05-07 ENCOUNTER — Encounter (HOSPITAL_COMMUNITY): Payer: Self-pay | Admitting: General Surgery

## 2016-06-20 ENCOUNTER — Other Ambulatory Visit: Payer: 59

## 2016-06-24 ENCOUNTER — Encounter: Payer: Self-pay | Admitting: Family Medicine

## 2016-06-24 ENCOUNTER — Ambulatory Visit (INDEPENDENT_AMBULATORY_CARE_PROVIDER_SITE_OTHER): Payer: 59 | Admitting: Family Medicine

## 2016-06-24 VITALS — BP 116/70 | HR 82 | Temp 97.5°F | Wt 217.2 lb

## 2016-06-24 DIAGNOSIS — Z23 Encounter for immunization: Secondary | ICD-10-CM | POA: Diagnosis not present

## 2016-06-24 DIAGNOSIS — E1159 Type 2 diabetes mellitus with other circulatory complications: Secondary | ICD-10-CM | POA: Diagnosis not present

## 2016-06-24 DIAGNOSIS — I10 Essential (primary) hypertension: Secondary | ICD-10-CM | POA: Diagnosis not present

## 2016-06-24 DIAGNOSIS — I152 Hypertension secondary to endocrine disorders: Secondary | ICD-10-CM

## 2016-06-24 DIAGNOSIS — E785 Hyperlipidemia, unspecified: Secondary | ICD-10-CM | POA: Diagnosis not present

## 2016-06-24 NOTE — Assessment & Plan Note (Signed)
Tolerating current meds, continue work on diet and exercise.  He agrees.  D/w pt.

## 2016-06-24 NOTE — Assessment & Plan Note (Signed)
Tolerating current meds, continue work on diet and exercise.  He agrees.  D/w pt.  BP controlled.

## 2016-06-24 NOTE — Patient Instructions (Signed)
Recheck in about 4 months, labs ahead of time.   Take care.  Glad to see you.  Update me as needed.

## 2016-06-24 NOTE — Assessment & Plan Note (Signed)
Controlled. Tolerating current meds, continue work on diet and exercise.  He agrees.  D/w pt. Recheck in about 4 months, sooner if needed.

## 2016-06-24 NOTE — Progress Notes (Signed)
Diabetes:  Using medications without difficulties:yes Hypoglycemic episodes:no Hyperglycemic episodes:no Feet problems:no Blood Sugars averaging: ~ 120s in the AM eye exam within last year:yes Flu shot due.  Done today at [facility].  D/w pt about f/u colonoscopy when possible.  I'll defer to patient.   A1c controlled at 6.1.  D/w pt.   S/p hernia repair, no pain.  Normal BMs.  Feels back to baseline, about 6 weeks out.    Hypertension:    Using medication without problems or lightheadedness: yes Chest pain with exertion:no Edema:no Short of breath:no  Elevated Cholesterol: Using medications without problems:yes Muscle aches: no Diet compliance: encouraged Exercise: limited by hernia status in the meantime.  Encouraged to get back to exercise gradually.  Other complaints:  PMH and SH reviewed  Meds, vitals, and allergies reviewed.   ROS: Per HPI unless specifically indicated in ROS section   GEN: nad, alert and oriented HEENT: mucous membranes moist NECK: supple w/o LA CV: rrr. PULM: ctab, no inc wob ABD: soft, +bs, hernia scar noted- small, not ttp.  EXT: no edema  Diabetic foot exam: Normal inspection No skin breakdown No calluses  Dec DP pulses B Normal sensation to light touch and monofilament Nails normal

## 2016-06-24 NOTE — Progress Notes (Signed)
Pre visit review using our clinic review tool, if applicable. No additional management support is needed unless otherwise documented below in the visit note. 

## 2016-07-11 LAB — HM DIABETES EYE EXAM

## 2016-08-12 ENCOUNTER — Encounter: Payer: Self-pay | Admitting: *Deleted

## 2016-08-13 ENCOUNTER — Telehealth: Payer: Self-pay

## 2016-08-13 NOTE — Telephone Encounter (Signed)
Form filled out on CMM. Waiting on response or more forms

## 2016-08-23 ENCOUNTER — Telehealth: Payer: Self-pay

## 2016-08-23 NOTE — Telephone Encounter (Signed)
Mrs Fana left v/m; needs PA for Jardiance; call [facility] [phone].

## 2016-08-26 NOTE — Telephone Encounter (Signed)
Processed request, again. Spoke to pt to let him know

## 2016-08-26 NOTE — Telephone Encounter (Signed)
See previous TE already created

## 2016-08-29 NOTE — Telephone Encounter (Signed)
Spoke to [place] while I was looking at [facility]. There was an appeal form on the website because it had been denied. I filled out the appeal.

## 2016-08-29 NOTE — Telephone Encounter (Signed)
[facility]'s [facility] called and said [facility]  She called [facility] and they told her if the rx was called in they would fill it.  [facility] number is [phone] or [phone].  These numbers are dedicated to the doctor's call. Please call patient's wife when complete at [phone].

## 2016-08-30 NOTE — Telephone Encounter (Signed)
Wife called back to say she had spoke to [place], again. She was told we needed to call again and ask for another PA. I called and spoke to [place]. She said the PA requires he try and fail BOTH Invokana and [place]. He has only tried Invokana per his med list. I advised them he had dm with cardiovascular disease and Jardiance was the best medication for this. They said he had to try [place]. I advised Juliann Pulse what was going on.  Please send new rx for Wilder Glade to the local pharmacy. If it does not work after a month, Juliann Pulse will let us know so we can do a new PA for the [employer]

## 2016-09-01 ENCOUNTER — Other Ambulatory Visit: Payer: Self-pay | Admitting: Family Medicine

## 2016-09-01 NOTE — Telephone Encounter (Signed)
See following phone note.  Thanks.

## 2016-09-01 NOTE — Progress Notes (Signed)
Farxiga rx ordered in EMR but not yet sent- does he want this sent locally or to [facility]?  Okay to send in as ordered, to either pharmacy. Thanks.

## 2016-09-02 MED ORDER — DAPAGLIFLOZIN PROPANEDIOL 5 MG PO TABS: 5.0000 mg | ORAL_TABLET | Freq: Every day | ORAL | 5 refills | Status: DC

## 2016-09-02 NOTE — Progress Notes (Signed)
Spoke to patient and was advised to the [facility] a 30 days supply. Script sent to pharmacy as instructed.

## 2016-09-09 NOTE — Telephone Encounter (Signed)
See letter from [facility] regarding Jardiance. Received PA approval. Letter on your desk. Medication is no longer is list.

## 2016-09-10 MED ORDER — EMPAGLIFLOZIN 10 MG PO TABS: 10.0000 mg | ORAL_TABLET | Freq: Every day | ORAL | Status: DC

## 2016-09-10 NOTE — Addendum Note (Signed)
Addended by: [name] on: 09/10/2016 06:56 AM   Modules accepted: Orders

## 2016-09-10 NOTE — Telephone Encounter (Signed)
Notify patient. Please remove Jorge Adams from the med list.  I re-added the jardiance.  If he needs a new prescription for that sent and, then let me know. This is ridiculous to have to go back and forth like this, because of his insurance company.

## 2016-09-10 NOTE — Telephone Encounter (Signed)
Finish what he has, then make the change.  Please make sure patient knows that all of this was driven by his insurance company, not [place].  Thanks.

## 2016-09-10 NOTE — Addendum Note (Signed)
Addended by: Emelia Salisbury C on: 09/10/2016 10:19 AM   Modules accepted: Orders

## 2016-09-10 NOTE — Telephone Encounter (Signed)
Patient notified as instructed by telephone and verbalized understanding. 

## 2016-09-10 NOTE — Telephone Encounter (Signed)
Patient notified as instructed by telephone and verbalized understanding. Patient stated that he started taking Farxiga last week and wants to know if he should finish taking the script and then switch back to [place]? Patient stated that he has 5 refills on the Jardiance. Medication removed from med list as instructed.

## 2016-09-29 ENCOUNTER — Other Ambulatory Visit: Payer: Self-pay | Admitting: Family Medicine

## 2016-10-06 ENCOUNTER — Other Ambulatory Visit: Payer: Self-pay | Admitting: Family Medicine

## 2016-10-07 NOTE — Telephone Encounter (Signed)
Received refill electronically Last office visit 06/24/16 Last refill 10/22/15/3 refills Should this be referred to cardiology?

## 2016-10-08 NOTE — Telephone Encounter (Signed)
This had come through [place] prev.  rx sent.  Thanks.

## 2016-10-13 ENCOUNTER — Other Ambulatory Visit: Payer: Self-pay | Admitting: Family Medicine

## 2016-10-13 DIAGNOSIS — E1159 Type 2 diabetes mellitus with other circulatory complications: Secondary | ICD-10-CM

## 2016-10-22 ENCOUNTER — Other Ambulatory Visit (INDEPENDENT_AMBULATORY_CARE_PROVIDER_SITE_OTHER): Payer: 59

## 2016-10-22 DIAGNOSIS — E1159 Type 2 diabetes mellitus with other circulatory complications: Secondary | ICD-10-CM | POA: Diagnosis not present

## 2016-10-22 LAB — HEMOGLOBIN A1C: HEMOGLOBIN A1C: 6.5 % (ref 4.6–6.5)

## 2016-10-25 ENCOUNTER — Telehealth: Payer: Self-pay

## 2016-10-25 ENCOUNTER — Ambulatory Visit (INDEPENDENT_AMBULATORY_CARE_PROVIDER_SITE_OTHER): Payer: 59 | Admitting: Family Medicine

## 2016-10-25 ENCOUNTER — Encounter: Payer: Self-pay | Admitting: Family Medicine

## 2016-10-25 DIAGNOSIS — E1159 Type 2 diabetes mellitus with other circulatory complications: Secondary | ICD-10-CM

## 2016-10-25 MED ORDER — EMPAGLIFLOZIN 10 MG PO TABS: 10.0000 mg | ORAL_TABLET | Freq: Every day | ORAL | 3 refills | Status: DC

## 2016-10-25 NOTE — Telephone Encounter (Signed)
Noted, I'll await the extra input/notes from cardiology.

## 2016-10-25 NOTE — Patient Instructions (Addendum)
Check on options re: meds and let me know about pricing.  Recheck A1c in about 4 months.  Check make sure you don't have lisinopril or diovan/valsartan rx at home.  If not, then let me know.   Take care.  Glad to see you.

## 2016-10-25 NOTE — Telephone Encounter (Signed)
Pt left v/m; pt was seen earlier today and pt said he does not take valsartan anymore. FYI to Dr Damita Dunnings.

## 2016-10-25 NOTE — Progress Notes (Signed)
Diabetes:  Using medications without difficulties:yes Hypoglycemic episodes:no Hyperglycemic episodes:no Feet problems:no Blood Sugars averaging:  132 this AM, that is typical with some mild variation noted as expected.   eye exam within last year:yes A1c up some, d/w pt about prev hernia repair, holidays and wintertime schedule changes, all likely contribute.   Not on ACE or ARB d/w pt.    BP up this Am but hasn't had meds today.    PMH and SH reviewed  Meds, vitals, and allergies reviewed.   ROS: Per HPI unless specifically indicated in ROS section   GEN: nad, alert and oriented HEENT: mucous membranes moist NECK: supple w/o LA CV: rrr. PULM: ctab, no inc wob ABD: soft, +bs EXT: no edema

## 2016-10-25 NOTE — Progress Notes (Signed)
Pre visit review using our clinic review tool, if applicable. No additional management support is needed unless otherwise documented below in the visit note. 

## 2016-10-27 NOTE — Assessment & Plan Note (Signed)
Recheck A1c in about 4 months.  No change in medications at this point. I am awaiting recent labs from cardiology so that I can fill out forms for work for patient. We will ask for cardiology input about ACE versus ARB use. Staff call the cardiology clinic in the meantime. I am awaiting response. Continue work on diet and exercise in the meantime. He agrees.

## 2016-11-05 ENCOUNTER — Telehealth: Payer: Self-pay | Admitting: Family Medicine

## 2016-11-05 NOTE — Telephone Encounter (Signed)
I am awaiting labs from Dr. Einar Gip to work on papers for the patient.  I am holding the form in the meantime.  Thanks.

## 2016-11-05 NOTE — Telephone Encounter (Deleted)
See staff message

## 2016-11-05 NOTE — Telephone Encounter (Signed)
Pt came in asking about papers that were supposed to be faxed after 4/6 visit. I didn't see anything in the chart about them so I put another copy in RX tower. Pt said they need to be faxed in to number on form.

## 2016-11-11 ENCOUNTER — Encounter: Payer: Self-pay | Admitting: *Deleted

## 2016-11-11 NOTE — Telephone Encounter (Signed)
Form faxed, copy made to scan, original mailed to patient.

## 2016-11-12 ENCOUNTER — Encounter: Payer: Self-pay | Admitting: Family Medicine

## 2016-11-12 LAB — CHOLESTEROL, TOTAL
ALT: 15
AST: 16 U/L
CHOLESTEROL, TOTAL: 155
Creatinine, Ser: 0.78
GLUCOSE: 140
HDL Cholesterol: 40
HEMOGLOBIN: 15.4
LDL Cholesterol (Calc): 55
TRIGLYCERIDES: 300

## 2016-11-13 ENCOUNTER — Other Ambulatory Visit: Payer: Self-pay | Admitting: Family Medicine

## 2016-11-13 NOTE — Telephone Encounter (Signed)
Received refill electronically Last office visit 10/25/16 Medication is not on medication list Please confirm directions

## 2016-11-14 NOTE — Telephone Encounter (Signed)
Sent. Thanks.   

## 2017-01-08 LAB — HM DIABETES EYE EXAM

## 2017-01-14 ENCOUNTER — Telehealth: Payer: Self-pay | Admitting: *Deleted

## 2017-01-14 ENCOUNTER — Other Ambulatory Visit (INDEPENDENT_AMBULATORY_CARE_PROVIDER_SITE_OTHER): Payer: 59

## 2017-01-14 DIAGNOSIS — E1159 Type 2 diabetes mellitus with other circulatory complications: Secondary | ICD-10-CM

## 2017-01-14 DIAGNOSIS — E119 Type 2 diabetes mellitus without complications: Secondary | ICD-10-CM

## 2017-01-14 LAB — HEMOGLOBIN A1C: Hgb A1c MFr Bld: 6.9 % — ABNORMAL HIGH (ref 4.6–6.5)

## 2017-01-14 NOTE — Telephone Encounter (Signed)
Pt notified of situation, he will be in for his f/u on 6/29.

## 2017-01-14 NOTE — Telephone Encounter (Signed)
-----  Message from [name], MD sent at 01/14/2017 12:23 PM EDT ----- Regarding: RE: Addendum I would keep the appointment, run the test, and I'll make it a phone note.  Thanks.  Please notify pt as a FYI.  Brigitte Pulse  ----- Message ----- From: Marchia Bond Sent: 01/14/2017  10:29 AM To: [name], MD Subject: Addendum                                       This pt came in for his f/u labs early. At his f/u on 4/6 his next f/u labs and f/u with you were supposed to be scheduled for 4 months, but the lab was accidentally scheduled today and the f/u for the 6/ 29 He checked in at 7:18, and I didn't catch this mistake until after I had drawn him and went to order the a1c. Do you want me to call him and reschedule appt's or to send blood and keep appt?    Thanks  Aniceto Boss    I contacted Rose and she said she thought it wouldn't matter, but she couldn't be sure so she suggested we call ins company. I called [place] [employer] [phone]) gave them the a1c cpt code  [phone]) and his dx code ( E11.9) and was told by Meg L. that the only limitation on that test was 1 per day, and that it will be covered. He may still have to pay for a portion of the test depending on if he hasn't met his deductible for the year, but that is standard. The reference # is [id number]. Since it is covered I will send the specimen to the lab. I haven't contacted the patient yet, do you still want me to do so? Also can you make this message a phone note to keep the record of the [facility] call and ref # in his chart.

## 2017-01-16 ENCOUNTER — Telehealth: Payer: Self-pay | Admitting: Family Medicine

## 2017-01-16 NOTE — Telephone Encounter (Signed)
Se below, patient was contacted.  Thanks.

## 2017-01-16 NOTE — Telephone Encounter (Signed)
-----  Message from Marchia Bond sent at 01/14/2017 10:29 AM EDT ----- Regarding: Addendum This pt came in for his f/u labs early. At his f/u on 4/6 his next f/u labs and f/u with you were supposed to be scheduled for 4 months, but the lab was accidentally scheduled today and the f/u for the 6/ 29 He checked in at 7:18, and I didn't catch this mistake until after I had drawn him and went to order the a1c. Do you want me to call him and reschedule appt's or to send blood and keep appt?    Thanks  Aniceto Boss    I contacted Rose and she said she thought it wouldn't matter, but she couldn't be sure so she suggested we call ins company. I called [place] [employer] [phone] gave them the a1c cpt code  [phone]) and his dx code ( E11.9) and was told by Meg L. that the only limitation on that test was 1 per day, and that it will be covered. He may still have to pay for a portion of the test depending on if he hasn't met his deductible for the year, but that is standard. The reference # is [id number]. Since it is covered I will send the specimen to the lab. I haven't contacted the patient yet, do you still want me to do so? Also can you make this message a phone note to keep the record of the [facility] call and ref # in his chart.

## 2017-01-17 ENCOUNTER — Encounter: Payer: Self-pay | Admitting: Family Medicine

## 2017-01-17 ENCOUNTER — Ambulatory Visit (INDEPENDENT_AMBULATORY_CARE_PROVIDER_SITE_OTHER): Payer: 59 | Admitting: Family Medicine

## 2017-01-17 DIAGNOSIS — E1159 Type 2 diabetes mellitus with other circulatory complications: Secondary | ICD-10-CM

## 2017-01-17 MED ORDER — SITAGLIPTIN PHOSPHATE 100 MG PO TABS: 50.0000 mg | ORAL_TABLET | Freq: Every day | ORAL | 3 refills | Status: DC

## 2017-01-17 NOTE — Patient Instructions (Addendum)
Stop jardiance, change to [place].  Update me as needed.  Recheck in about 3 months, labs ahead of time.  Call GI about follow up.  Take care.  Glad to see you.

## 2017-01-17 NOTE — Assessment & Plan Note (Signed)
Stop jardiance, change to [place].  He'll update me as needed.  Recheck in about 3 months, labs ahead of time.  He agrees.  Continue with work on diet and exercise.  Labs d/w pt.

## 2017-01-17 NOTE — Progress Notes (Signed)
Diabetes:  Using medications without difficulties: yes Hypoglycemic episodes: no Hyperglycemic episodes: no Feet problems: no Blood Sugars averaging: lowest is 99, usually around 110-125 eye exam within last year: yes, 01/08/17.  A1c controlled, d/w pt.   He is going on medicare and isn't going to have coverage for jardiance.  It will be prohibitively expensive.  D/w pt.    He can call GI about f/u, d/w pt.    Meds, vitals, and allergies reviewed.   ROS: Per HPI unless specifically indicated in ROS section   GEN: nad, alert and oriented HEENT: mucous membranes moist NECK: supple w/o LA CV: rrr. PULM: ctab, no inc wob ABD: soft, +bs EXT: no edema SKIN: no acute rash  Diabetic foot exam: Normal inspection No skin breakdown No calluses  Normal DP pulses Normal sensation to light touch and monofilament Nails normal

## 2017-02-10 DIAGNOSIS — H401131 Primary open-angle glaucoma, bilateral, mild stage: Secondary | ICD-10-CM | POA: Diagnosis not present

## 2017-02-10 DIAGNOSIS — H53001 Unspecified amblyopia, right eye: Secondary | ICD-10-CM | POA: Diagnosis not present

## 2017-02-10 DIAGNOSIS — H35372 Puckering of macula, left eye: Secondary | ICD-10-CM | POA: Diagnosis not present

## 2017-02-10 DIAGNOSIS — H2513 Age-related nuclear cataract, bilateral: Secondary | ICD-10-CM | POA: Diagnosis not present

## 2017-02-10 DIAGNOSIS — R69 Illness, unspecified: Secondary | ICD-10-CM | POA: Diagnosis not present

## 2017-02-13 ENCOUNTER — Other Ambulatory Visit: Payer: Self-pay | Admitting: Family Medicine

## 2017-02-24 DIAGNOSIS — H401111 Primary open-angle glaucoma, right eye, mild stage: Secondary | ICD-10-CM | POA: Diagnosis not present

## 2017-03-10 DIAGNOSIS — H401111 Primary open-angle glaucoma, right eye, mild stage: Secondary | ICD-10-CM | POA: Diagnosis not present

## 2017-03-17 DIAGNOSIS — R69 Illness, unspecified: Secondary | ICD-10-CM | POA: Diagnosis not present

## 2017-03-23 ENCOUNTER — Other Ambulatory Visit: Payer: Self-pay | Admitting: Family Medicine

## 2017-04-02 DIAGNOSIS — I25119 Atherosclerotic heart disease of native coronary artery with unspecified angina pectoris: Secondary | ICD-10-CM | POA: Diagnosis not present

## 2017-04-02 DIAGNOSIS — E1151 Type 2 diabetes mellitus with diabetic peripheral angiopathy without gangrene: Secondary | ICD-10-CM | POA: Diagnosis not present

## 2017-04-02 DIAGNOSIS — E782 Mixed hyperlipidemia: Secondary | ICD-10-CM | POA: Diagnosis not present

## 2017-04-02 DIAGNOSIS — I10 Essential (primary) hypertension: Secondary | ICD-10-CM | POA: Diagnosis not present

## 2017-04-12 DIAGNOSIS — R69 Illness, unspecified: Secondary | ICD-10-CM | POA: Diagnosis not present

## 2017-04-14 ENCOUNTER — Other Ambulatory Visit: Payer: Self-pay | Admitting: Family Medicine

## 2017-04-14 DIAGNOSIS — E1159 Type 2 diabetes mellitus with other circulatory complications: Secondary | ICD-10-CM

## 2017-04-16 ENCOUNTER — Other Ambulatory Visit (INDEPENDENT_AMBULATORY_CARE_PROVIDER_SITE_OTHER): Payer: Medicare HMO

## 2017-04-16 DIAGNOSIS — E1159 Type 2 diabetes mellitus with other circulatory complications: Secondary | ICD-10-CM

## 2017-04-16 LAB — HEMOGLOBIN A1C: Hgb A1c MFr Bld: 6.6 % — ABNORMAL HIGH (ref 4.6–6.5)

## 2017-04-18 ENCOUNTER — Ambulatory Visit: Payer: 59 | Admitting: Family Medicine

## 2017-04-19 ENCOUNTER — Other Ambulatory Visit: Payer: Self-pay | Admitting: Family Medicine

## 2017-04-21 DIAGNOSIS — H53001 Unspecified amblyopia, right eye: Secondary | ICD-10-CM | POA: Diagnosis not present

## 2017-04-21 DIAGNOSIS — H401131 Primary open-angle glaucoma, bilateral, mild stage: Secondary | ICD-10-CM | POA: Diagnosis not present

## 2017-04-21 DIAGNOSIS — H35372 Puckering of macula, left eye: Secondary | ICD-10-CM | POA: Diagnosis not present

## 2017-04-21 DIAGNOSIS — H2513 Age-related nuclear cataract, bilateral: Secondary | ICD-10-CM | POA: Diagnosis not present

## 2017-04-25 ENCOUNTER — Ambulatory Visit (INDEPENDENT_AMBULATORY_CARE_PROVIDER_SITE_OTHER): Payer: Medicare HMO | Admitting: Family Medicine

## 2017-04-25 ENCOUNTER — Encounter: Payer: Self-pay | Admitting: Family Medicine

## 2017-04-25 VITALS — BP 112/64 | HR 74 | Temp 97.6°F | Wt 221.0 lb

## 2017-04-25 DIAGNOSIS — Z23 Encounter for immunization: Secondary | ICD-10-CM

## 2017-04-25 DIAGNOSIS — E1159 Type 2 diabetes mellitus with other circulatory complications: Secondary | ICD-10-CM | POA: Diagnosis not present

## 2017-04-25 MED ORDER — SITAGLIPTIN PHOSPHATE 100 MG PO TABS: 100.0000 mg | ORAL_TABLET | Freq: Every day | ORAL | Status: DC

## 2017-04-25 NOTE — Assessment & Plan Note (Signed)
A1c controlled, if any lows then cut the [place] in half.  Recheck periodically.  He agrees.  Continue work on diet and exercise.  He agrees.  I thanked him for his effort.

## 2017-04-25 NOTE — Progress Notes (Signed)
Diabetes:  Using medications without difficulties:yes Hypoglycemic episodes:no Hyperglycemic episodes:no Feet problems:no Blood Sugars averaging: usually ~ 110 (up to 130 max) eye exam within last year: yes A1c controlled.   [facility].   Diet is good, d/w pt.    He is going to have f/u surgery re: his eye pressure and cataracts.    Meds, vitals, and allergies reviewed.   ROS: Per HPI unless specifically indicated in ROS section   GEN: nad, alert and oriented HEENT: mucous membranes moist NECK: supple w/o LA CV: rrr. PULM: ctab, no inc wob ABD: soft, +bs EXT: no edema SKIN: no acute rash

## 2017-04-25 NOTE — Addendum Note (Signed)
Addended by: Josetta Huddle on: 04/25/2017 08:58 AM   Modules accepted: Orders

## 2017-04-25 NOTE — Patient Instructions (Addendum)
Schedule a medicare wellness visit/physical when possible with labs in about 3-4 months.  If you have any lows, then cut the januvia in half and update me.  Take care.  Glad to see you.  Update me as needed.  Thanks for your effort.

## 2017-04-28 IMAGING — CR DG CHEST 1V PORT
1 series · 1 of 1 positions shown · non-contrast
Comparison: Portable exam 5006 hours compared to 01/03/2016

CLINICAL DATA: Sore chest, post CABG

EXAM:
PORTABLE CHEST 1 VIEW

[AP]
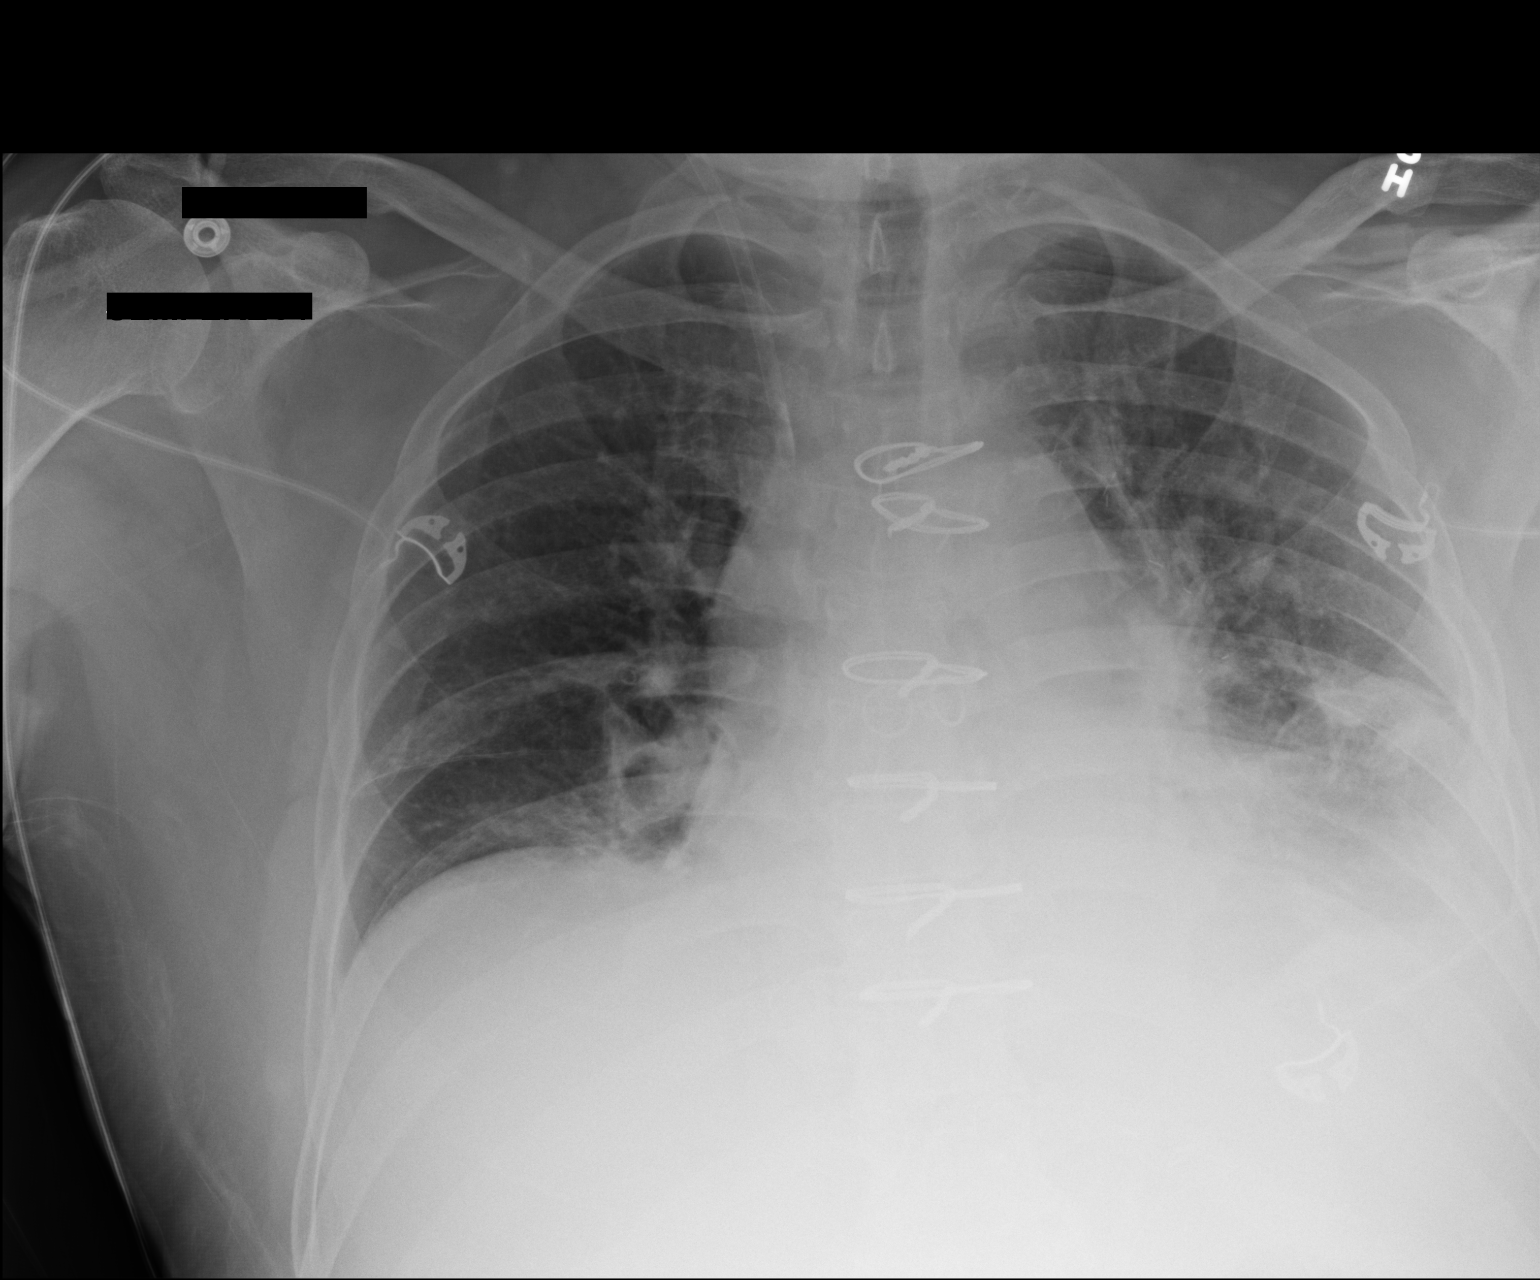

[1 of 1 positions shown; findings below may reference images not displayed]

FINDINGS: Interval removal of Swan-Ganz catheter and LEFT thoracostomy tube.

RIGHT jugular line tip projecting over SVC.

Enlargement of cardiac silhouette post CABG.

Slight pulmonary vascular congestion.

Bibasilar atelectasis greater on LEFT.

Upper lungs clear.

Questionable tiny LEFT apex pneumothorax versus artifact.

Osseous structures unremarkable.
IMPRESSION: Bibasilar atelectasis.

Questionable tiny LEFT apex pneumothorax post thoracostomy tube
removal.

## 2017-04-30 IMAGING — CR DG ABD PORTABLE 1V
2 series · 2 of 2 positions shown · non-contrast
Comparison: 01/05/2016 abdominal radiograph

CLINICAL DATA: Ileus

EXAM:
PORTABLE ABDOMEN - 1 VIEW

[AP (1 of 2)]
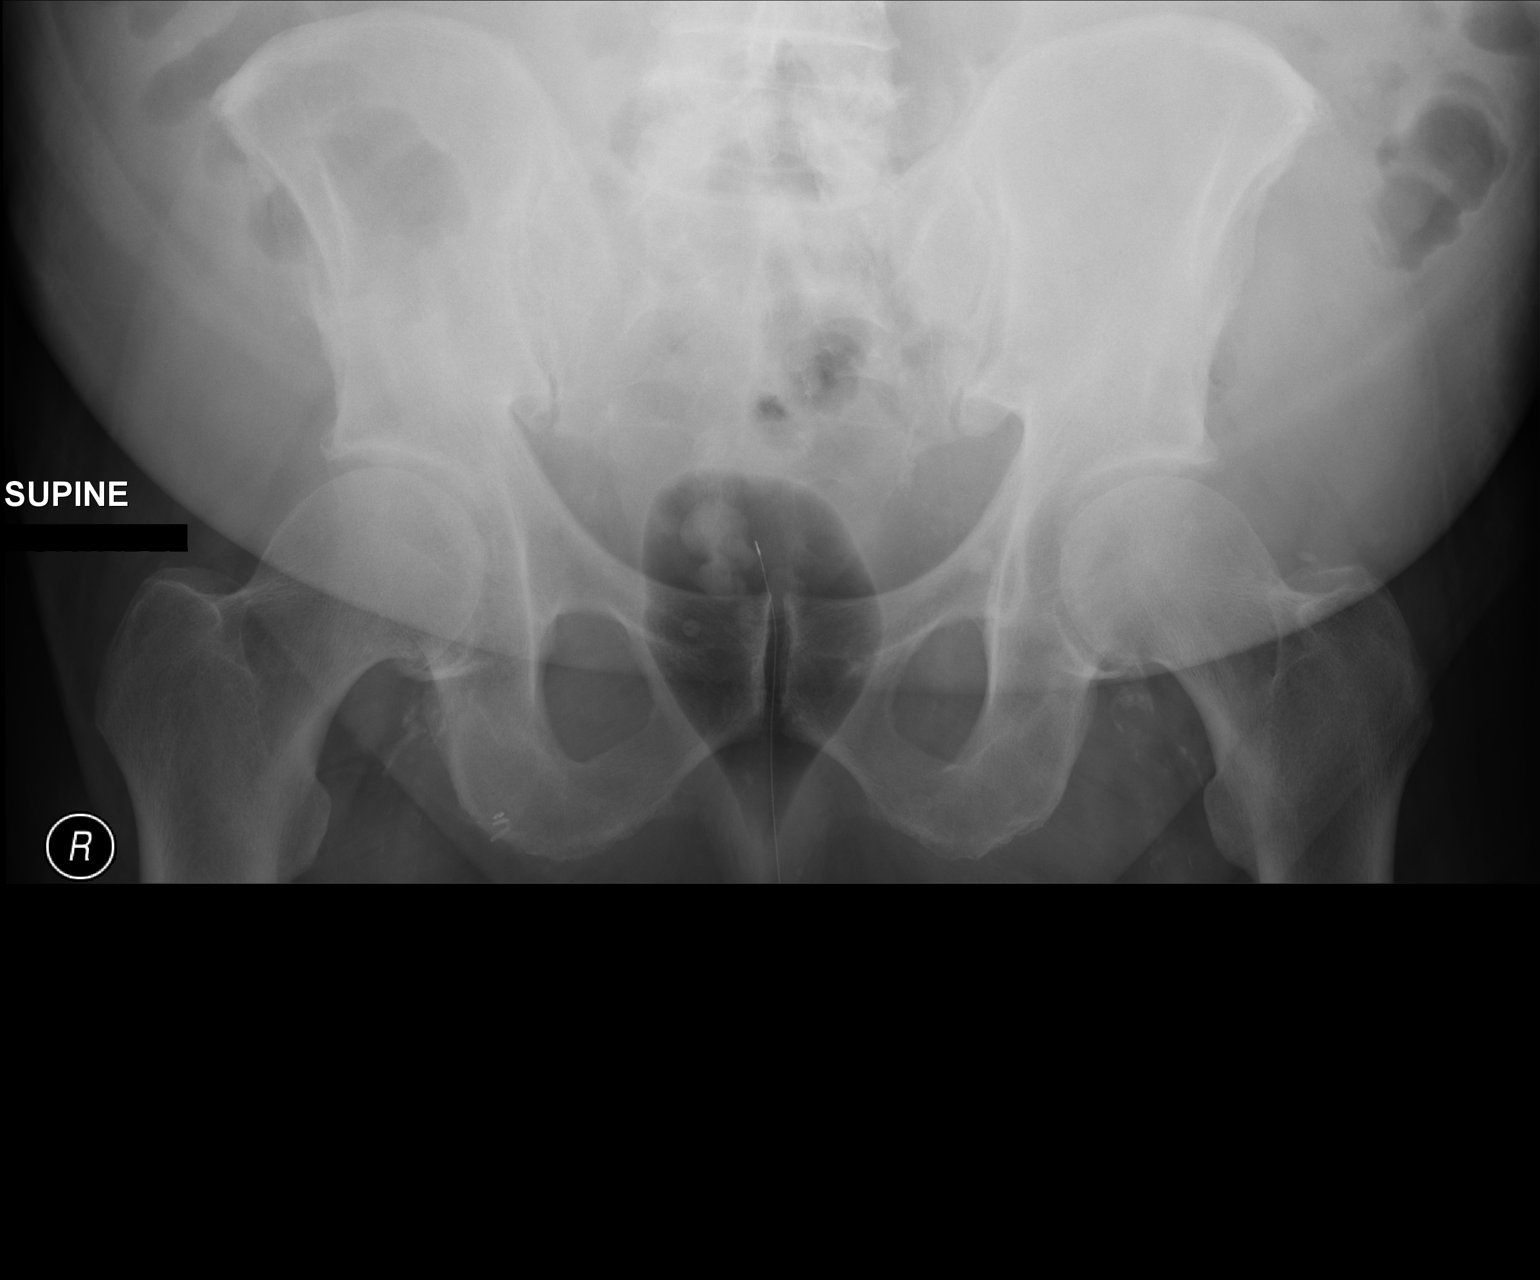

[AP (2 of 2)]
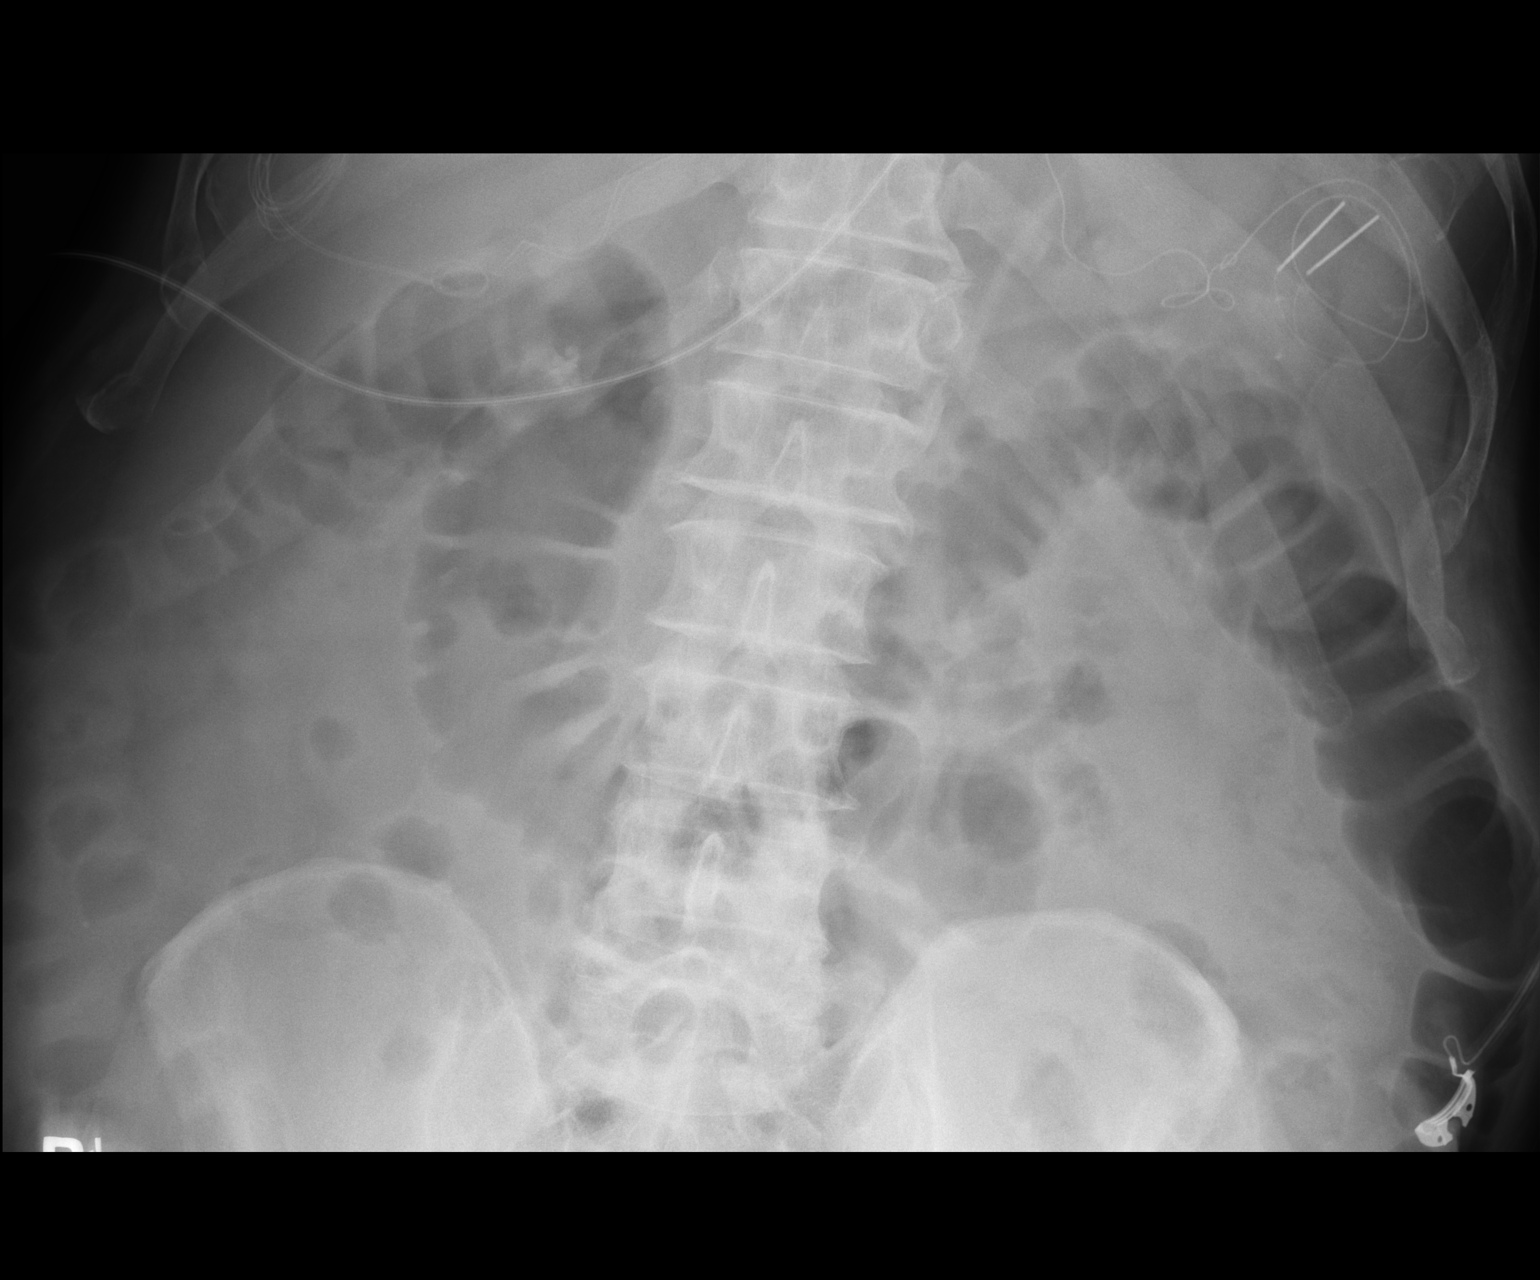

[2 of 2 positions shown; findings below may reference images not displayed]

FINDINGS: No dilated small bowel loops. Mild gaseous distention of the colon
is slightly improved. No evidence of pneumatosis or
pneumoperitoneum. Catheter overlies the lower sacrum in the midline.
Stable subcentimeter clustered calcifications in the right upper
quadrant of the abdomen.
IMPRESSION: Nonobstructive bowel gas pattern.

Mild gas distention of the colon, improved.

Stable clustered subcentimeter right upper quadrant calcifications,
either gallstones or renal stones.

## 2017-05-01 DIAGNOSIS — E782 Mixed hyperlipidemia: Secondary | ICD-10-CM | POA: Diagnosis not present

## 2017-05-01 DIAGNOSIS — I25119 Atherosclerotic heart disease of native coronary artery with unspecified angina pectoris: Secondary | ICD-10-CM | POA: Diagnosis not present

## 2017-05-01 DIAGNOSIS — E1151 Type 2 diabetes mellitus with diabetic peripheral angiopathy without gangrene: Secondary | ICD-10-CM | POA: Diagnosis not present

## 2017-05-01 DIAGNOSIS — I10 Essential (primary) hypertension: Secondary | ICD-10-CM | POA: Diagnosis not present

## 2017-05-03 IMAGING — DX DG CHEST 2V
2 series · 2 of 2 positions shown · non-contrast
Comparison: Portable chest x-ray January 06, 2016

CLINICAL DATA: One week status post see a BG. Wheezing on exam; no
history of asthma, former smoker.

EXAM:
CHEST  2 VIEW

[chest pa]
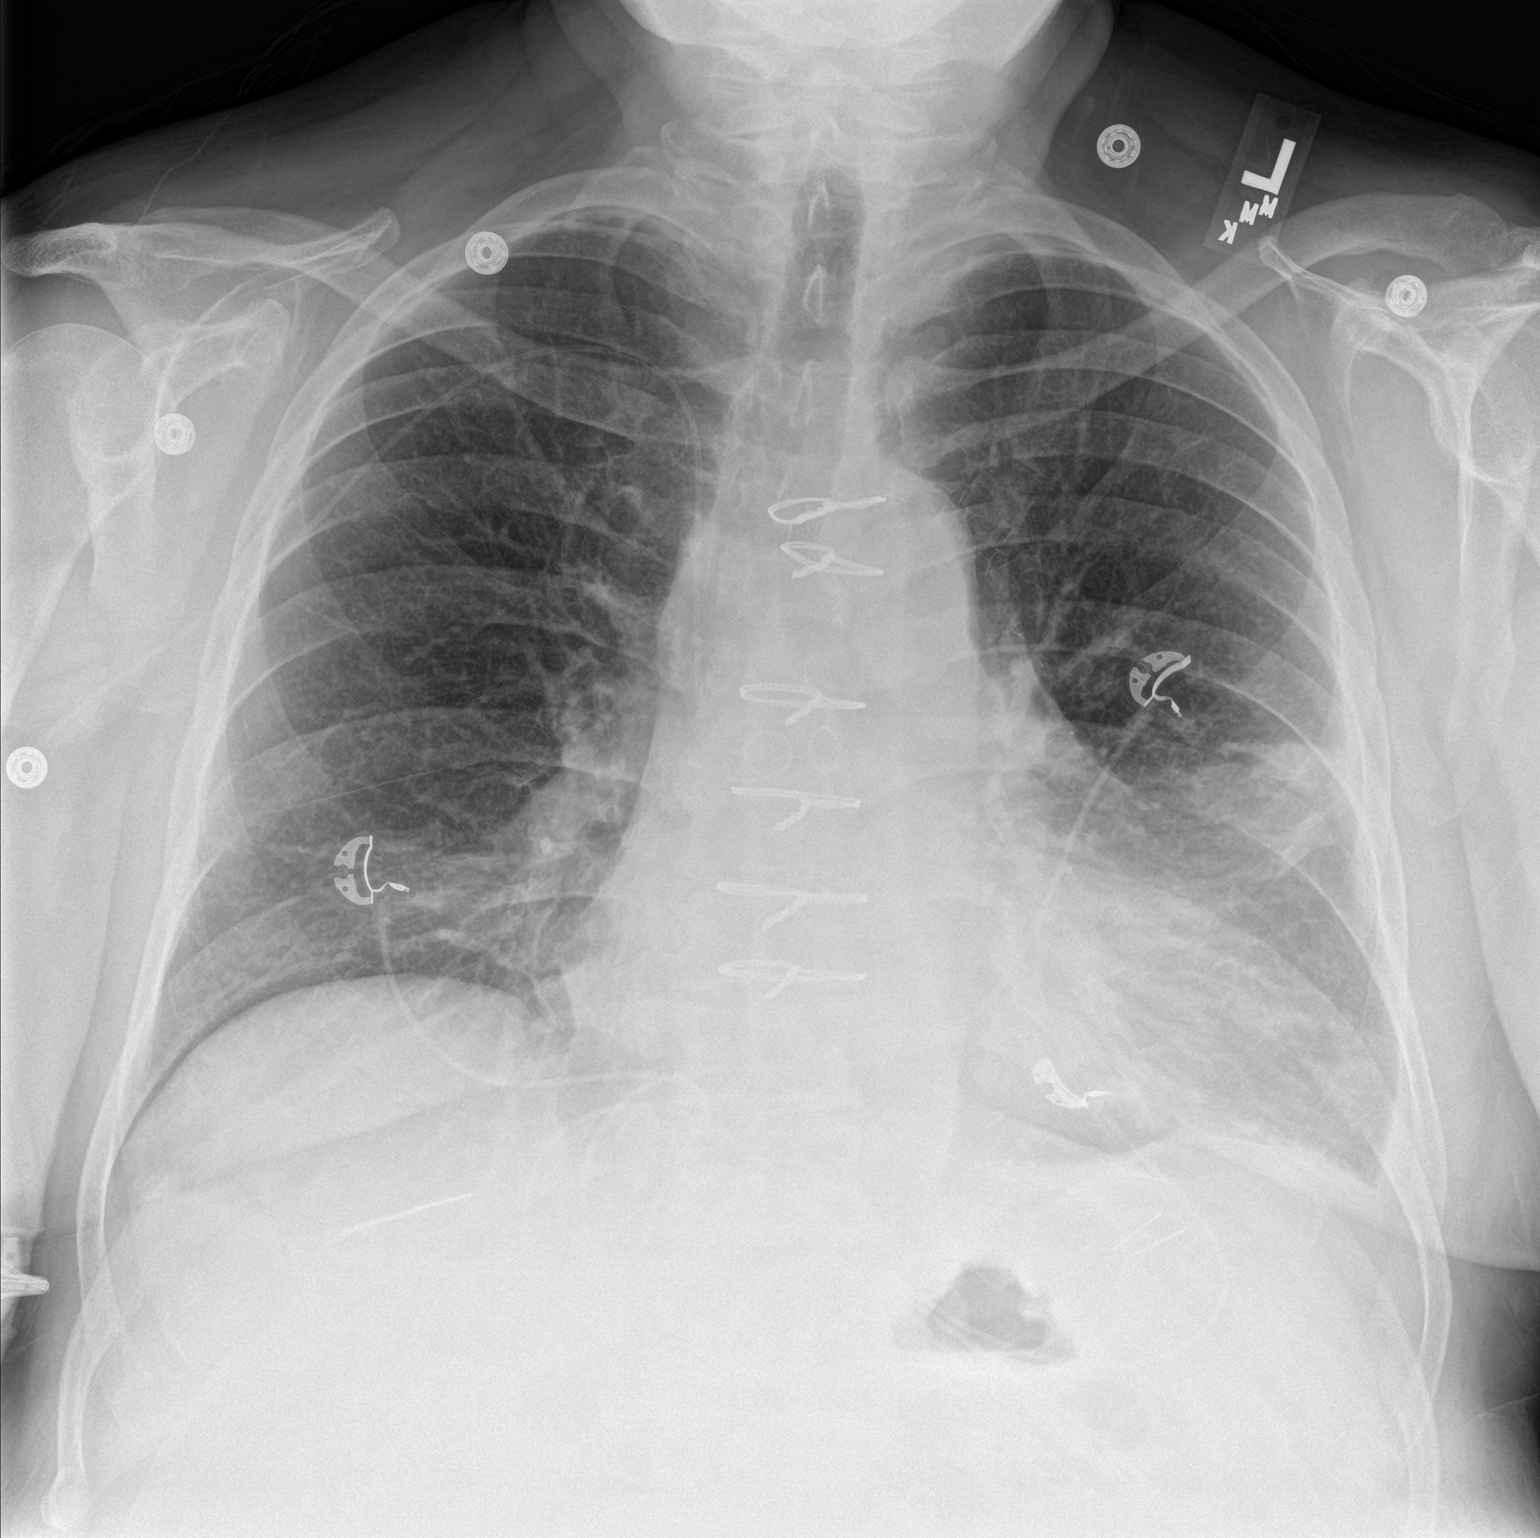

[chest lat]
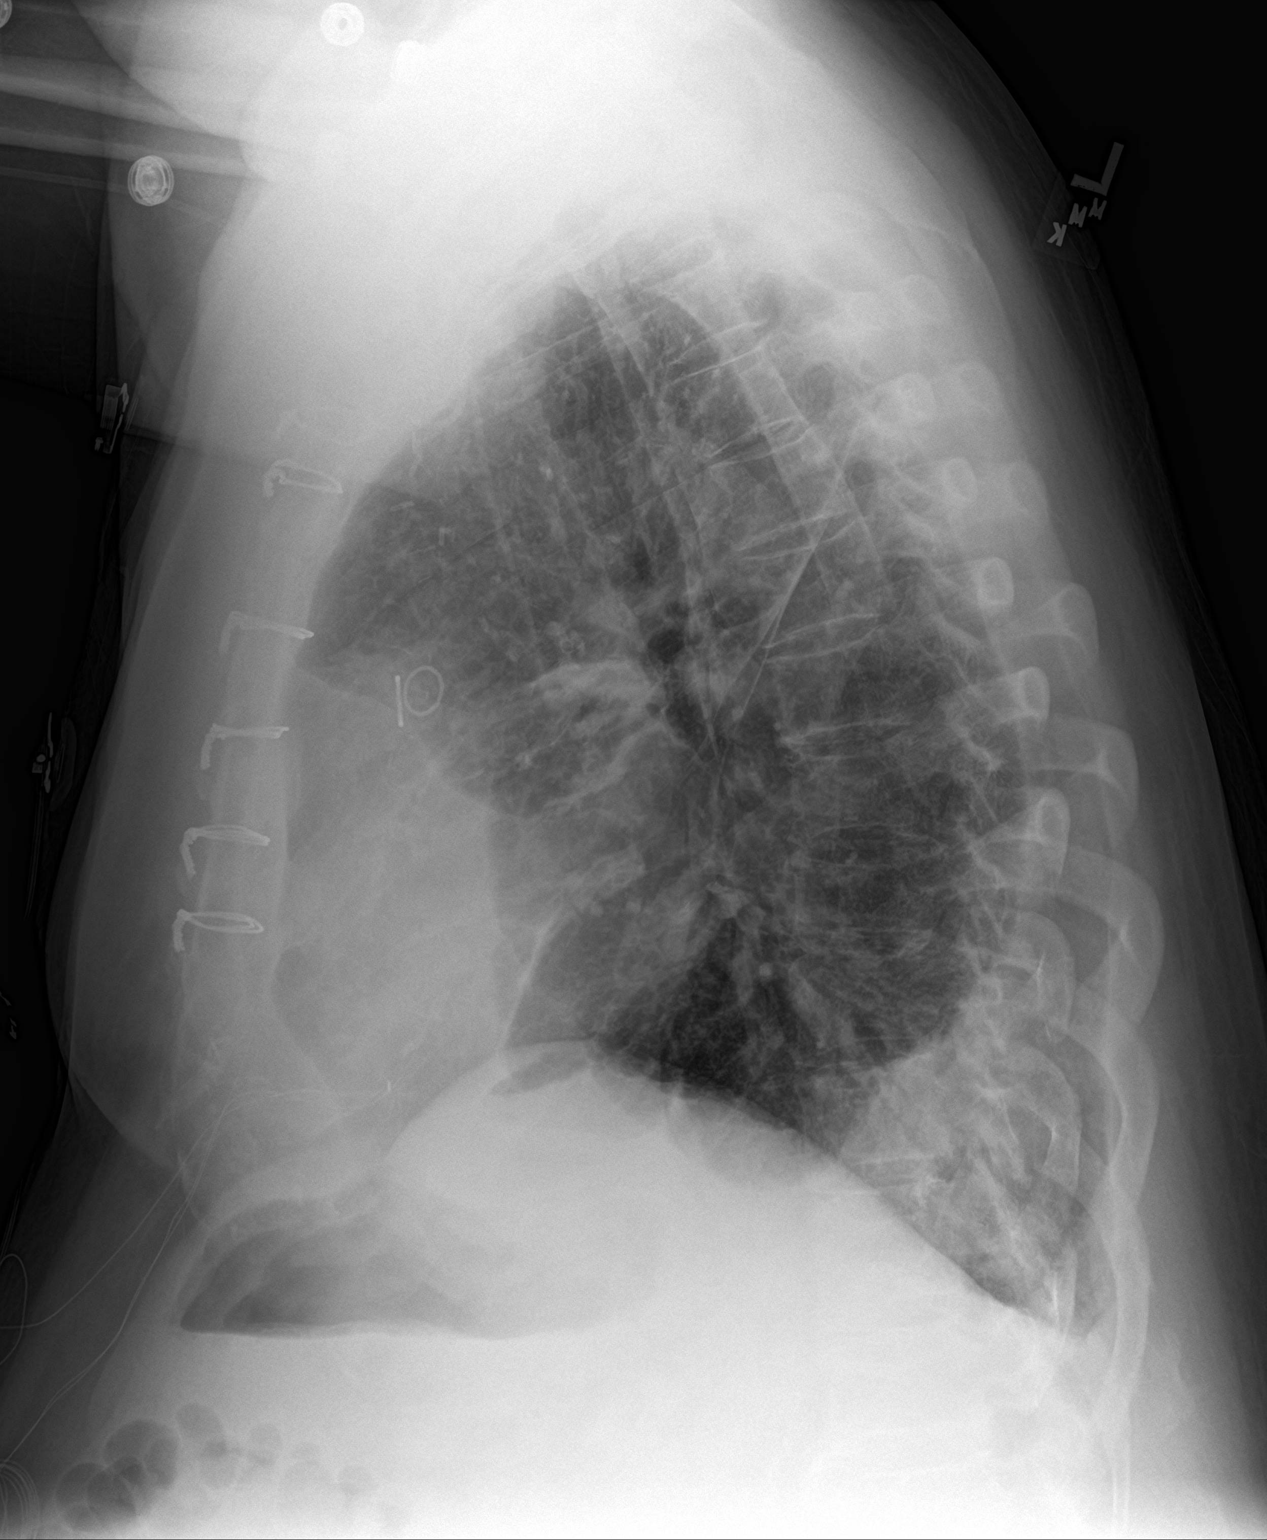

[2 of 2 positions shown; findings below may reference images not displayed]

FINDINGS: There is patchy increased density peripherally in the left mid lung
and in the right infrahilar region. The findings are more
conspicuous today. The cardiac silhouette remains enlarged. The
pulmonary vascularity is not clearly engorged. There is a small left
pleural effusion. The sternal wires are intact. The mediastinum is
normal in width. The PICC line tip projects over the midportion of
the SVC.
IMPRESSION: Patchy interstitial density bilaterally is worrisome for
subsegmental atelectasis or early pneumonia. No significant
interstitial edema is observed today. A small left pleural effusion
remains.

## 2017-05-06 ENCOUNTER — Encounter: Payer: Self-pay | Admitting: Family Medicine

## 2017-05-06 LAB — LAB REPORT - SCANNED
ALT: 15
AST: 16
CHOLESTEROL, TOTAL: 155
Creatinine, Ser: 0.78
Glucose: 140
HDL: 40
LDL CHOLESTEROL (CALC): 55
TRIGLYCERIDES: 300

## 2017-05-22 DIAGNOSIS — H2511 Age-related nuclear cataract, right eye: Secondary | ICD-10-CM | POA: Diagnosis not present

## 2017-05-22 DIAGNOSIS — H401111 Primary open-angle glaucoma, right eye, mild stage: Secondary | ICD-10-CM | POA: Diagnosis not present

## 2017-05-22 HISTORY — PX: CATARACT EXTRACTION: SUR2

## 2017-07-22 HISTORY — PX: CATARACT EXTRACTION: SUR2

## 2017-07-27 DIAGNOSIS — H2512 Age-related nuclear cataract, left eye: Secondary | ICD-10-CM | POA: Diagnosis not present

## 2017-07-31 DIAGNOSIS — H401122 Primary open-angle glaucoma, left eye, moderate stage: Secondary | ICD-10-CM | POA: Diagnosis not present

## 2017-07-31 DIAGNOSIS — H401121 Primary open-angle glaucoma, left eye, mild stage: Secondary | ICD-10-CM | POA: Diagnosis not present

## 2017-07-31 DIAGNOSIS — H2512 Age-related nuclear cataract, left eye: Secondary | ICD-10-CM | POA: Diagnosis not present

## 2017-08-13 DIAGNOSIS — E1151 Type 2 diabetes mellitus with diabetic peripheral angiopathy without gangrene: Secondary | ICD-10-CM | POA: Diagnosis not present

## 2017-08-13 DIAGNOSIS — N529 Male erectile dysfunction, unspecified: Secondary | ICD-10-CM | POA: Diagnosis not present

## 2017-08-13 DIAGNOSIS — I25119 Atherosclerotic heart disease of native coronary artery with unspecified angina pectoris: Secondary | ICD-10-CM | POA: Diagnosis not present

## 2017-08-13 DIAGNOSIS — I10 Essential (primary) hypertension: Secondary | ICD-10-CM | POA: Diagnosis not present

## 2017-08-18 ENCOUNTER — Other Ambulatory Visit: Payer: Self-pay | Admitting: Family Medicine

## 2017-08-18 DIAGNOSIS — E1159 Type 2 diabetes mellitus with other circulatory complications: Secondary | ICD-10-CM

## 2017-08-25 ENCOUNTER — Telehealth: Payer: Self-pay | Admitting: *Deleted

## 2017-08-25 ENCOUNTER — Ambulatory Visit (INDEPENDENT_AMBULATORY_CARE_PROVIDER_SITE_OTHER): Payer: Medicare HMO

## 2017-08-25 VITALS — BP 118/80 | HR 75 | Temp 98.2°F | Ht 69.0 in | Wt 221.0 lb

## 2017-08-25 DIAGNOSIS — E1159 Type 2 diabetes mellitus with other circulatory complications: Secondary | ICD-10-CM

## 2017-08-25 DIAGNOSIS — Z0000 Encounter for general adult medical examination without abnormal findings: Secondary | ICD-10-CM | POA: Diagnosis not present

## 2017-08-25 LAB — HEMOGLOBIN A1C: Hgb A1c MFr Bld: 5.9 % (ref 4.6–6.5)

## 2017-08-25 NOTE — Progress Notes (Signed)
PCP notes:   Health maintenance:  Colonoscopy - addressed; PCP please address at next appt A1C - completed  Abnormal screenings:   Mini-Cog score: 19/20 MMSE - Mini Mental State Exam 08/25/2017  Orientation to time 5  Orientation to Place 5  Registration 3  Attention/ Calculation 0  Recall 2  Recall-comments unable to recall 1 of 3 words  Language- name 2 objects 0  Language- repeat 1  Language- follow 3 step command 3  Language- read & follow direction 0  Write a sentence 0  Copy design 0  Total score 19     Patient concerns:   None  Nurse concerns:  None  Next PCP appt:   09/02/17 @ 0845  I reviewed health advisor's note, was available for consultation on the day of service listed in this note, and agree with documentation and plan. Elsie Stain, MD.

## 2017-08-25 NOTE — Patient Instructions (Signed)
Jorge Adams , Thank you for taking time to come for your Medicare Wellness Visit. I appreciate your ongoing commitment to your health goals. Please review the following plan we discussed and let me know if I can assist you in the future.   These are the goals we discussed: Goals    . Increase physical activity     Starting 08/25/2017, I will continue to exercise for at least 120 minutes 3 days per week.        This is a list of the screening recommended for you and due dates:  Health Maintenance  Topic Date Due  . Colon Cancer Screening  07/21/2018*  . Hemoglobin A1C  10/14/2017  . Eye exam for diabetics  01/08/2018  . Complete foot exam   01/17/2018  . Tetanus Vaccine  09/10/2020  . Flu Shot  Completed  .  Hepatitis C: One time screening is recommended by Center for Disease Control  (CDC) for  adults born from [birth year] through 1967.   Completed  . Pneumonia vaccines  Completed  *Topic was postponed. The date shown is not the original due date.   Preventive Care for Adults  A healthy lifestyle and preventive care can promote health and wellness. Preventive health guidelines for adults include the following key practices.  . A routine yearly physical is a good way to check with your health care provider about your health and preventive screening. It is a chance to share any concerns and updates on your health and to receive a thorough exam.  . Visit your dentist for a routine exam and preventive care every 6 months. Brush your teeth twice a day and floss once a day. Good oral hygiene prevents tooth decay and gum disease.  . The frequency of eye exams is based on your age, health, family medical history, use  of contact lenses, and other factors. Follow your health care provider's recommendations for frequency of eye exams.  . Eat a healthy diet. Foods like vegetables, fruits, whole grains, low-fat dairy products, and lean protein foods contain the nutrients you need without too many  calories. Decrease your intake of foods high in solid fats, added sugars, and salt. Eat the right amount of calories for you. Get information about a proper diet from your health care provider, if necessary.  . Regular physical exercise is one of the most important things you can do for your health. Most adults should get at least 150 minutes of moderate-intensity exercise (any activity that increases your heart rate and causes you to sweat) each week. In addition, most adults need muscle-strengthening exercises on 2 or more days a week.  Silver Sneakers may be a benefit available to you. To determine eligibility, you may visit the website: www.silversneakers.com or contact program at [phone] Mon-Fri between 8AM-8PM.   . Maintain a healthy weight. The body mass index (BMI) is a screening tool to identify possible weight problems. It provides an estimate of body fat based on height and weight. Your health care provider can find your BMI and can help you achieve or maintain a healthy weight.   For adults 20 years and older: ? A BMI below 18.5 is considered underweight. ? A BMI of 18.5 to 24.9 is normal. ? A BMI of 25 to 29.9 is considered overweight. ? A BMI of 30 and above is considered obese.   . Maintain normal blood lipids and cholesterol levels by exercising and minimizing your intake of saturated fat. Eat a balanced diet  with plenty of fruit and vegetables. Blood tests for lipids and cholesterol should begin at age 48 and be repeated every 5 years. If your lipid or cholesterol levels are high, you are over 50, or you are at high risk for heart disease, you may need your cholesterol levels checked more frequently. Ongoing high lipid and cholesterol levels should be treated with medicines if diet and exercise are not working.  . If you smoke, find out from your health care provider how to quit. If you do not use tobacco, please do not start.  . If you choose to drink alcohol, please do  not consume more than 2 drinks per day. One drink is considered to be 12 ounces (355 mL) of beer, 5 ounces (148 mL) of wine, or 1.5 ounces (44 mL) of liquor.  . If you are 21-59 years old, ask your health care provider if you should take aspirin to prevent strokes.  . Use sunscreen. Apply sunscreen liberally and repeatedly throughout the day. You should seek shade when your shadow is shorter than you. Protect yourself by wearing long sleeves, pants, a wide-brimmed hat, and sunglasses year round, whenever you are outdoors.  . Once a month, do a whole body skin exam, using a mirror to look at the skin on your back. Tell your health care provider of new moles, moles that have irregular borders, moles that are larger than a pencil eraser, or moles that have changed in shape or color.

## 2017-08-25 NOTE — Telephone Encounter (Deleted)
Received a fax from [facility] saying that the patient's PA for Jardiance 10 mg tablet is about to expire.  I do not see Jardiance on the patient's current meds list.

## 2017-08-25 NOTE — Progress Notes (Signed)
Subjective:   Jorge Adams is a 67 y.o. male who presents for an Initial Medicare Annual Wellness Visit.  Review of Systems  N/A Cardiac Risk Factors include: advanced age (>74men, >76 women);male gender;obesity (BMI >30kg/m2);diabetes mellitus;dyslipidemia;hypertension    Objective:    Today's Vitals   08/25/17 0902  BP: 118/80  Pulse: 75  Temp: 98.2 F (36.8 C)  TempSrc: Oral  SpO2: 95%  Weight: 221 lb (100.2 kg)  Height: 5\' 9"  (1.753 m)  PainSc: 0-No pain   Body mass index is 32.64 kg/m.  Advanced Directives 08/25/2017 05/01/2016 02/20/2016 01/03/2016 01/01/2016 12/21/2015 11/23/2013  Does Patient Have a Medical Advance Directive? No No No No No No Patient does not have advance directive;Patient would like information  Does patient want to make changes to medical advance directive? - - - - - - No change requested  Would patient like information on creating a medical advance directive? Yes (MAU/Ambulatory/Procedural Areas - Information given) No - patient declined information No - patient declined information No - patient declined information No - patient declined information No - patient declined information Advance directive packet given    Current Medications (verified) Outpatient Encounter Medications as of 08/25/2017  Medication Sig  . amLODipine-benazepril (LOTREL) 5-20 MG capsule Take 1 capsule by mouth daily.  Marland Kitchen aspirin EC 81 MG tablet Take 81 mg by mouth daily.  . brimonidine (ALPHAGAN P) 0.1 % SOLN Place 1 drop into both eyes 2 (two) times daily.  . cilostazol (PLETAL) 100 MG tablet TAKE 1 TABLET BY MOUTH TWICE A DAY  . dorzolamide-timolol (COSOPT) 22.3-6.8 MG/ML ophthalmic solution Place 1 drop into both eyes 2 (two) times daily.  . fenofibrate (TRICOR) 145 MG tablet Take 145 mg by mouth daily.  . metFORMIN (GLUCOPHAGE) 1000 MG tablet TAKE 1 TABLET (1,000 MG TOTAL) BY MOUTH 2 (TWO) TIMES DAILY.  . metoprolol tartrate (LOPRESSOR) 25 MG tablet Take 25 mg by mouth 2  (two) times daily.  . nitroGLYCERIN (NITROSTAT) 0.4 MG SL tablet PLACE 1 TABLET UNDER TONGUE EVERY 5 MINUTES AS NEEDED FOR CHEST PAIN.  Marland Kitchen omega-3 acid ethyl esters (LOVAZA) 1 g capsule Take 1 capsule by mouth 2 (two) times daily.  Glory Rosebush DELICA LANCETS 31D MISC USE DAILY AS NEEDED  . ONETOUCH VERIO test strip USE DAILY AS NEEDED  . rosuvastatin (CRESTOR) 20 MG tablet Take 20 mg by mouth daily.   . sitaGLIPtin (JANUVIA) 100 MG tablet Take 1 tablet (100 mg total) by mouth daily.  . TRAVATAN Z 0.004 % SOLN ophthalmic solution Place 1 drop into both eyes at bedtime.   No facility-administered encounter medications on file as of 08/25/2017.     Allergies (verified) No known allergies   History: Past Medical History:  Diagnosis Date  . Arthritis   . ASHD (arteriosclerotic heart disease)   . Atherosclerosis of native arteries of the extremities with intermittent claudication   . Cataract   . Coronary atherosclerosis of native coronary artery   . Diabetes mellitus    NIDDM  . Dyslipidemia   . ED (erectile dysfunction)   . Full dentures   . Glaucoma associated with ocular disorder, mild stage    bilateral  . Hypertension    under control, has been on med. > 9 yr.  . Impotence of organic origin   . Meniscus tear 05/2012   right knee  . Myocardial infarction ([place]) 10/2002  . Stented coronary artery    Past Surgical History:  Procedure Laterality Date  .  CARDIAC CATHETERIZATION  10/23/2002   had 1 stent placed then-- by Dr. Melvern Banker  . CARDIAC CATHETERIZATION N/A 12/21/2015   Procedure: Left Heart Cath and Coronary Angiography;  Surgeon: Adrian Prows, MD;  Location: [place] [facility];  Service: Cardiovascular;  Laterality: N/A;  . CATARACT EXTRACTION Right 05/2017  . CATARACT EXTRACTION Left 07/2017  . COLONOSCOPY    . CORONARY ARTERY BYPASS GRAFT N/A 01/02/2016   Procedure: CORONARY ARTERY BYPASS GRAFTING (CABG) TIMES 3 USING LEFT INTERNAL MAMMARY ARTERY AND RIGHT SAPHENOUS LEG VEIN  HARVESTED ENDOSCOPICALLY;  Surgeon: Ivin Poot, MD;  Location: [place];  Service: Open Heart Surgery;  Laterality: N/A;  . INSERTION OF MESH N/A 05/06/2016   Procedure: INSERTION OF MESH;  Surgeon: Georganna Skeans, MD;  Location: [place];  Service: General;  Laterality: N/A;  . KNEE ARTHROSCOPY WITH MEDIAL MENISECTOMY  06/04/2012   Procedure: KNEE ARTHROSCOPY WITH MEDIAL MENISECTOMY;  Surgeon: Ninetta Lights, MD;  Location: [place];  Service: Orthopedics;  Laterality: Right;  RIGHT SCOPE MEDIAL MENISCECTOMY, CHONDROPLASTY, EXCISION LOOSE BODY  . LOWER EXTREMITY ANGIOGRAM N/A 11/23/2013   Procedure: LOWER EXTREMITY ANGIOGRAM;  Surgeon: Laverda Page, MD;  Location: [facility];  Service: Cardiovascular;  Laterality: N/A;  . PERIPHERAL VASCULAR CATHETERIZATION N/A 12/21/2015   Procedure: Abdominal Aortogram;  Surgeon: Adrian Prows, MD;  Location: [place] [facility];  Service: Cardiovascular;  Laterality: N/A;  . PERIPHERAL VASCULAR CATHETERIZATION N/A 12/21/2015   Procedure: Abdominal Aortogram w/Lower Extremity;  Surgeon: Adrian Prows, MD;  Location: [place] [facility];  Service: Cardiovascular;  Laterality: N/A;  . TEE WITHOUT CARDIOVERSION N/A 01/02/2016   Procedure: TRANSESOPHAGEAL ECHOCARDIOGRAM (TEE);  Surgeon: Ivin Poot, MD;  Location: [place];  Service: Open Heart Surgery;  Laterality: N/A;  . UMBILICAL HERNIA REPAIR N/A 05/06/2016   Procedure: HERNIA REPAIR UMBILICAL ADULT;  Surgeon: Georganna Skeans, MD;  Location: [facility];  Service: General;  Laterality: N/A;   Family History  Problem Relation Age of Onset  . Bone cancer Mother   . Pneumonia Father   . Diabetes Brother   . Colon cancer Neg Hx   . Prostate cancer Neg Hx    Social History   Socioeconomic History  . Marital status: Married    Spouse name: None  . Number of children: None  . Years of education: None  . Highest education level: None  Social Needs  . Financial resource strain: None  . Food insecurity -  worry: None  . Food insecurity - inability: None  . Transportation needs - medical: None  . Transportation needs - non-medical: None  Occupational History  . None  Tobacco Use  . Smoking status: Former Smoker    Last attempt to quit: 12/02/2002    Years since quitting: 14.7  . Smokeless tobacco: Never Used  Substance and Sexual Activity  . Alcohol use: Yes    Comment: rare  . Drug use: No  . Sexual activity: None  Other Topics Concern  . 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    [employer], [employer], [employer] fan   Tobacco Counseling Counseling given: No   Clinical Intake:  Pre-visit preparation completed: Yes  Pain : No/denies pain Pain Score: 0-No pain     Nutritional Status: BMI > 30  Obese Nutritional Risks: None Diabetes: Yes CBG done?: No Did pt.  bring in CBG monitor from home?: No  How often do you need to have someone help you when you read instructions, pamphlets, or other written materials from your doctor or pharmacy?: 1 - Never What is the last grade level you completed in school?: 12th grade  Interpreter Needed?: No  Comments: pt lives with spouse Information entered by :: LPinson, LPN  Activities of Daily Living In your present state of health, do you have any difficulty performing the following activities: 08/25/2017  Hearing? N  Vision? N  Difficulty concentrating or making decisions? N  Walking or climbing stairs? N  Dressing or bathing? N  Doing errands, shopping? N  Preparing Food and eating ? N  Using the Toilet? N  In the past six months, have you accidently leaked urine? N  Do you have problems with loss of bowel control? N  Managing your Medications? N  Managing your Finances? N  Housekeeping or managing your Housekeeping? N  Some recent data might be hidden     Immunizations and Health  Maintenance Immunization History  Administered Date(s) Administered  . Influenza Split 05/01/2012, 04/21/2013  . Influenza,inj,Quad PF,6+ Mos 06/06/2015, 06/24/2016, 04/25/2017  . Influenza-Unspecified 04/27/2014  . Pneumococcal Conjugate-13 12/02/2014  . Pneumococcal Polysaccharide-23 09/10/2010, 12/04/2015  . Tdap 09/10/2010  . Zoster 09/10/2010   There are no preventive care reminders to display for this patient.  Patient Care Team: [name], MD as PCP - General (Family Medicine) Adrian Prows, MD as Consulting Physician (Cardiology) [place], [place], [place] as Referring Physician (Optometry) Warden Fillers, MD as Consulting Physician (Ophthalmology)  Indicate any recent Medical Services you may have received from other than [facility] providers in the past year (date may be approximate).    Assessment:   This is a routine wellness examination for [place].  Hearing/Vision screen  Hearing Screening   125Hz  250Hz  500Hz  1000Hz  2000Hz  3000Hz  4000Hz  6000Hz  8000Hz   Right ear:   40 40 40  40    Left ear:   40 40 40  40    Vision Screening Comments: Last vision exam in Jan 2018 with Dr. Katy Fitch  Dietary issues and exercise activities discussed: Current Exercise Habits: Home exercise routine, Type of exercise: strength training/weights;stretching;treadmill, Time (Minutes): > 60, Frequency (Times/Week): 3, Weekly Exercise (Minutes/Week): 0, Intensity: Moderate, Exercise limited by: None identified  Goals    . Increase physical activity     Starting 08/25/2017, I will continue to exercise for at least 120 minutes 3 days per week.       Depression Screen PHQ 2/9 Scores 08/25/2017 04/30/2016 02/20/2016 05/29/2012  PHQ - 2 Score 0 0 0 0  PHQ- 9 Score 0 0 0 -    Fall Risk Fall Risk  08/25/2017 02/20/2016  Falls in the past year? No No   Cognitive Function: MMSE - Mini Mental State Exam 08/25/2017  Orientation to time 5  Orientation to Place 5  Registration 3  Attention/ Calculation 0  Recall  2  Recall-comments unable to recall 1 of 3 words  Language- name 2 objects 0  Language- repeat 1  Language- follow 3 step command 3  Language- read & follow direction 0  Write a sentence 0  Copy design 0  Total score 19     PLEASE NOTE: A Mini-Cog screen was completed. Maximum score is 20. A value of 0 denotes this part of Folstein MMSE was not completed or the patient failed this part of the Mini-Cog screening.   Mini-Cog Screening Orientation to  Time - Max 5 pts Orientation to Place - Max 5 pts Registration - Max 3 pts Recall - Max 3 pts Language Repeat - Max 1 pts Language Follow 3 Step Command - Max 3 pts     Screening Tests Health Maintenance  Topic Date Due  . COLONOSCOPY  07/21/2018 (Originally 10/12/2015)  . HEMOGLOBIN A1C  10/14/2017  . OPHTHALMOLOGY EXAM  01/08/2018  . FOOT EXAM  01/17/2018  . TETANUS/TDAP  09/10/2020  . INFLUENZA VACCINE  Completed  . Hepatitis C Screening  Completed  . PNA vac Low Risk Adult  Completed       Plan:     I have personally reviewed, addressed, and noted the following in the patient's chart:  A. Medical and social history B. Use of alcohol, tobacco or illicit drugs  C. Current medications and supplements D. Functional ability and status E.  Nutritional status F.  Physical activity G. Advance directives H. List of other physicians I.  Hospitalizations, surgeries, and ER visits in previous 12 months J.  [place] to include hearing, vision, cognitive, depression L. Referrals and appointments - none  In addition, I have reviewed and discussed with patient certain preventive protocols, quality metrics, and best practice recommendations. A written personalized care plan for preventive services as well as general preventive health recommendations were provided to patient.  See attached scanned questionnaire for additional information.   Signed,   Lindell Noe, MHA, BS, LPN Health Coach

## 2017-08-25 NOTE — Progress Notes (Signed)
Pre visit review using our clinic review tool, if applicable. No additional management support is needed unless otherwise documented below in the visit note. 

## 2017-08-26 ENCOUNTER — Ambulatory Visit: Payer: Medicare HMO

## 2017-08-26 NOTE — Telephone Encounter (Signed)
Erroneous encounter

## 2017-09-02 ENCOUNTER — Ambulatory Visit (INDEPENDENT_AMBULATORY_CARE_PROVIDER_SITE_OTHER): Payer: Medicare HMO | Admitting: Family Medicine

## 2017-09-02 ENCOUNTER — Encounter: Payer: Self-pay | Admitting: Family Medicine

## 2017-09-02 VITALS — BP 118/80 | HR 75 | Temp 98.2°F | Ht 69.0 in | Wt 221.0 lb

## 2017-09-02 DIAGNOSIS — I152 Hypertension secondary to endocrine disorders: Secondary | ICD-10-CM

## 2017-09-02 DIAGNOSIS — E785 Hyperlipidemia, unspecified: Secondary | ICD-10-CM

## 2017-09-02 DIAGNOSIS — E1159 Type 2 diabetes mellitus with other circulatory complications: Secondary | ICD-10-CM | POA: Diagnosis not present

## 2017-09-02 DIAGNOSIS — I10 Essential (primary) hypertension: Secondary | ICD-10-CM

## 2017-09-02 DIAGNOSIS — Z0000 Encounter for general adult medical examination without abnormal findings: Secondary | ICD-10-CM

## 2017-09-02 MED ORDER — METFORMIN HCL 1000 MG PO TABS: 1000.0000 mg | ORAL_TABLET | Freq: Two times a day (BID) | ORAL | 3 refills | Status: DC

## 2017-09-02 MED ORDER — CILOSTAZOL 100 MG PO TABS: 100.0000 mg | ORAL_TABLET | Freq: Two times a day (BID) | ORAL | 3 refills | Status: DC

## 2017-09-02 MED ORDER — SITAGLIPTIN PHOSPHATE 100 MG PO TABS: 50.0000 mg | ORAL_TABLET | Freq: Every day | ORAL | Status: DC

## 2017-09-02 NOTE — Progress Notes (Signed)
Prostate cancer screening and PSA options (with potential risks and benefits of testing vs not testing) were discussed along with recent recs/guidelines. He declined testing PSA at this point.  No LUTS.  Living will d/w pt. Wife designated if patient were incapacitated. Colonoscopy - pending.  D/w pt.  He'll call about that.   Diet and exercise d/w pt.  [facility]  He is using the treadmill and stationary bike.  Some weights.  He is there about 2 hours at a time.  He feels better doing the exercise.   Mini-Cog score: 19/20.  He didn't have red flag sx.  Neither he nor I thinks he has sig memory troubles.    Hypertension:    Using medication without problems or lightheadedness: yes Chest pain with exertion:no Edema: no Short of breath:no  Elevated Cholesterol: Using medications without problems: yes Muscle aches: no Diet compliance:yes Exercise:yes  Diabetes:  Using medications without difficulties:yes Hypoglycemic episodes:no Hyperglycemic episodes:no Feet problems:no Blood Sugars averaging: ~112-116 eye exam within last year:yes A1c d/w pt- much improved.   D/w pt about taper medicine.    PMH and SH reviewed  Meds, vitals, and allergies reviewed.   ROS: Per HPI unless specifically indicated in ROS section   GEN: nad, alert and oriented HEENT: mucous membranes moist NECK: supple w/o LA CV: rrr. PULM: ctab, no inc wob ABD: soft, +bs EXT: no edema SKIN: no acute rash  Diabetic foot exam: Normal inspection No skin breakdown No calluses  Intact DP pulses Normal sensation to light touch and monofilament Nails normal

## 2017-09-02 NOTE — Patient Instructions (Addendum)
Call about a colonoscopy.  Recheck labs in about 3 months, since you are cutting the januvia back to 50mg  for about 10 days.  If you can keep your sugars <140, then stop the [place] totally.   Take care.  Glad to see you.  Thank you for your effort.

## 2017-09-03 ENCOUNTER — Telehealth: Payer: Self-pay

## 2017-09-03 NOTE — Telephone Encounter (Signed)
Copied from [place]. Topic: Quick Communication - Rx Refill/Question >> Sep 03, 2017  2:50 PM Scherrie Gerlach wrote: Medication empagliflozin (JARDIANCE) 10 MG TABS tablet   Lea with Cover my meds says please call back for assistance in getting this med approved. Lea states it appears someone started working on this prior and it did not go through.  She is here to help  [facility]

## 2017-09-03 NOTE — Telephone Encounter (Signed)
jardiance is on hx med list.Please advise.

## 2017-09-04 DIAGNOSIS — Z0000 Encounter for general adult medical examination without abnormal findings: Secondary | ICD-10-CM | POA: Insufficient documentation

## 2017-09-04 NOTE — Assessment & Plan Note (Signed)
Tolerating medication.  Continue as is.  He agrees.  Continue work on diet and exercise.

## 2017-09-04 NOTE — Assessment & Plan Note (Signed)
Controlled.  Continue as is.  No change in medications.  Continue work on diet and exercise.  He agrees. >25 minutes spent in face to face time with patient, >50% spent in counselling or coordination of care, discussing hypertension, hyperlipidemia, diabetes, diet and exercise, recent labs.

## 2017-09-04 NOTE — Assessment & Plan Note (Signed)
Blood Sugars averaging: ~112-116 eye exam within last year:yes A1c d/w pt- much improved.   D/w pt about taper medicine.   Recheck labs in about 3 months, since he will be cutting the januvia back to 50mg  for about 10 days.  If he can keep your sugars <140, then stop the [place] totally.

## 2017-09-04 NOTE — Assessment & Plan Note (Signed)
Prostate cancer screening and PSA options (with potential risks and benefits of testing vs not testing) were discussed along with recent recs/guidelines. He declined testing PSA at this point.  No LUTS.  Living will d/w pt. Wife designated if patient were incapacitated. Colonoscopy - pending.  D/w pt.  He'll call about that.   Diet and exercise d/w pt.  [facility]  He is using the treadmill and stationary bike.  Some weights.  He is there about 2 hours at a time.  He feels better doing the exercise.   Mini-Cog score: 19/20.  He didn't have red flag sx.  Neither he nor I thinks he has sig memory troubles.

## 2017-09-05 NOTE — Telephone Encounter (Signed)
Spoke with representative and advised that this patient is no longer taking this medication.

## 2017-10-22 ENCOUNTER — Telehealth: Payer: Self-pay | Admitting: Family Medicine

## 2017-10-22 NOTE — Telephone Encounter (Signed)
Copied from [place] [id number]. Topic: Appointment Scheduling - Scheduling Inquiry for Clinic >> Oct 22, 2017  1:51 PM Synthia Innocent wrote: Reason for CRM: Would like to be added to [facility] wait list

## 2017-12-03 ENCOUNTER — Other Ambulatory Visit (INDEPENDENT_AMBULATORY_CARE_PROVIDER_SITE_OTHER): Payer: Medicare HMO

## 2017-12-03 DIAGNOSIS — E1159 Type 2 diabetes mellitus with other circulatory complications: Secondary | ICD-10-CM | POA: Diagnosis not present

## 2017-12-03 LAB — HEMOGLOBIN A1C: Hgb A1c MFr Bld: 6.1 % (ref 4.6–6.5)

## 2017-12-09 DIAGNOSIS — H401131 Primary open-angle glaucoma, bilateral, mild stage: Secondary | ICD-10-CM | POA: Diagnosis not present

## 2018-01-14 DIAGNOSIS — L918 Other hypertrophic disorders of the skin: Secondary | ICD-10-CM | POA: Diagnosis not present

## 2018-01-14 DIAGNOSIS — X32XXXA Exposure to sunlight, initial encounter: Secondary | ICD-10-CM | POA: Diagnosis not present

## 2018-01-14 DIAGNOSIS — Z1283 Encounter for screening for malignant neoplasm of skin: Secondary | ICD-10-CM | POA: Diagnosis not present

## 2018-01-14 DIAGNOSIS — D225 Melanocytic nevi of trunk: Secondary | ICD-10-CM | POA: Diagnosis not present

## 2018-01-14 DIAGNOSIS — L570 Actinic keratosis: Secondary | ICD-10-CM | POA: Diagnosis not present

## 2018-02-05 DIAGNOSIS — I2510 Atherosclerotic heart disease of native coronary artery without angina pectoris: Secondary | ICD-10-CM | POA: Diagnosis not present

## 2018-02-05 DIAGNOSIS — I10 Essential (primary) hypertension: Secondary | ICD-10-CM | POA: Diagnosis not present

## 2018-02-05 DIAGNOSIS — E1151 Type 2 diabetes mellitus with diabetic peripheral angiopathy without gangrene: Secondary | ICD-10-CM | POA: Diagnosis not present

## 2018-02-05 DIAGNOSIS — I739 Peripheral vascular disease, unspecified: Secondary | ICD-10-CM | POA: Diagnosis not present

## 2018-02-11 ENCOUNTER — Other Ambulatory Visit: Payer: Self-pay | Admitting: Family Medicine

## 2018-02-11 DIAGNOSIS — M791 Myalgia, unspecified site: Secondary | ICD-10-CM

## 2018-03-04 ENCOUNTER — Ambulatory Visit: Payer: Medicare HMO | Admitting: Family Medicine

## 2018-03-13 ENCOUNTER — Encounter: Payer: Self-pay | Admitting: Family Medicine

## 2018-03-13 ENCOUNTER — Ambulatory Visit (INDEPENDENT_AMBULATORY_CARE_PROVIDER_SITE_OTHER): Payer: Medicare HMO | Admitting: Family Medicine

## 2018-03-13 VITALS — BP 130/78 | HR 83 | Temp 98.5°F | Ht 69.0 in | Wt 210.5 lb

## 2018-03-13 DIAGNOSIS — M791 Myalgia, unspecified site: Secondary | ICD-10-CM | POA: Diagnosis not present

## 2018-03-13 DIAGNOSIS — R202 Paresthesia of skin: Secondary | ICD-10-CM

## 2018-03-13 DIAGNOSIS — E1159 Type 2 diabetes mellitus with other circulatory complications: Secondary | ICD-10-CM

## 2018-03-13 DIAGNOSIS — E119 Type 2 diabetes mellitus without complications: Secondary | ICD-10-CM | POA: Diagnosis not present

## 2018-03-13 LAB — CBC WITH DIFFERENTIAL/PLATELET
BASOS PCT: 0.6 % (ref 0.0–3.0)
Basophils Absolute: 0 10*3/uL (ref 0.0–0.1)
EOS ABS: 0.2 10*3/uL (ref 0.0–0.7)
Eosinophils Relative: 3.5 % (ref 0.0–5.0)
HCT: 42.3 % (ref 39.0–52.0)
HEMOGLOBIN: 14.5 g/dL (ref 13.0–17.0)
LYMPHS ABS: 1.3 10*3/uL (ref 0.7–4.0)
Lymphocytes Relative: 22.7 % (ref 12.0–46.0)
MCHC: 34.4 g/dL (ref 30.0–36.0)
MCV: 85.7 fl (ref 78.0–100.0)
MONO ABS: 0.5 10*3/uL (ref 0.1–1.0)
Monocytes Relative: 8.6 % (ref 3.0–12.0)
NEUTROS ABS: 3.6 10*3/uL (ref 1.4–7.7)
Neutrophils Relative %: 64.6 % (ref 43.0–77.0)
PLATELETS: 153 10*3/uL (ref 150.0–400.0)
RBC: 4.93 Mil/uL (ref 4.22–5.81)
RDW: 15.3 % (ref 11.5–15.5)
WBC: 5.5 10*3/uL (ref 4.0–10.5)

## 2018-03-13 LAB — COMPREHENSIVE METABOLIC PANEL
ALK PHOS: 39 U/L (ref 39–117)
ALT: 17 U/L (ref 0–53)
AST: 15 U/L (ref 0–37)
Albumin: 4.2 g/dL (ref 3.5–5.2)
BILIRUBIN TOTAL: 0.7 mg/dL (ref 0.2–1.2)
BUN: 14 mg/dL (ref 6–23)
CHLORIDE: 103 meq/L (ref 96–112)
CO2: 27 mEq/L (ref 19–32)
Calcium: 9.7 mg/dL (ref 8.4–10.5)
Creatinine, Ser: 0.87 mg/dL (ref 0.40–1.50)
GFR: 92.55 mL/min (ref >60.00)
GLUCOSE: 128 mg/dL — AB (ref 70–99)
POTASSIUM: 4.4 meq/L (ref 3.5–5.1)
SODIUM: 137 meq/L (ref 135–145)
Total Protein: 7.1 g/dL (ref 6.0–8.3)

## 2018-03-13 LAB — TSH: TSH: 2.68 u[IU]/mL (ref 0.35–4.50)

## 2018-03-13 LAB — POCT GLYCOSYLATED HEMOGLOBIN (HGB A1C): Hemoglobin A1C: 5.6 % (ref 4.0–5.6)

## 2018-03-13 LAB — VITAMIN B12: Vitamin B-12: 274 pg/mL (ref 211–911)

## 2018-03-13 LAB — VITAMIN D 25 HYDROXY (VIT D DEFICIENCY, FRACTURES): VITD: 47.52 ng/mL (ref 30.00–100.00)

## 2018-03-13 NOTE — Progress Notes (Addendum)
Diabetes:  Using medications without difficulties:yes Hypoglycemic episodes:no Hyperglycemic episodes:no Feet problems: some tingling in the B feet Blood Sugars averaging: ~110 usually.  eye exam within last year: due, d/w pt. Pending for next week.   A1c 5.6.  Intentional changes- cutting back on sweets/is exercising.  D/w pt.  "I've been working on it."    He isn't lightheaded.    He has some B arm and leg tingling.  Started when he fell about 2 months ago.  He fell on his arms and legs, forward, he did not hit his head.  No LOC.  No head injury.  No foot drop.  Can still walk up stairs.  His balance has been "not good for awhile" predating the fall.  He can still feel the floor with walking.  He started taking OTC b12 in the meantime w/o relief.  He can still walk 1 mile on a treadmill. Stopping crestor didn't help.    He naps in the afternoon, up to 3 hours per afternoon.  Not much snoring.  Not walking gasping for air.  Not waking up tired but some fatigue o/w.  Started after the fall.   Meds, vitals, and allergies reviewed.   ROS: Per HPI unless specifically indicated in ROS section   GEN: nad, alert and oriented HEENT: mucous membranes moist NECK: supple w/o LA CV: rrr. PULM: ctab, no inc wob ABD: soft, +bs EXT: no edema SKIN: no acute rash CN 2-12 wnl B, strength wnl x4, he does have a feeling of tingling in the extremities but his sensation is still intact x4  Diabetic foot exam: Normal inspection No skin breakdown No calluses  Normal DP pulses Normal sensation to light touch and monofilament Nails normal

## 2018-03-13 NOTE — Patient Instructions (Addendum)
Stop the [place] and recheck in 3 months.   Go to the lab on the way out.  We'll contact you with your lab report. We may need to decrease your BP meds in the future.  Take care.  Glad to see you.  Update me as needed.

## 2018-03-15 DIAGNOSIS — R202 Paresthesia of skin: Secondary | ICD-10-CM | POA: Insufficient documentation

## 2018-03-15 NOTE — Assessment & Plan Note (Signed)
A1c down to 5.6.  Discussed with patient about options. Stop the [place] and recheck in 3 months.  I thanked him for his effort.  See above.  He put in significant effort.

## 2018-03-15 NOTE — Assessment & Plan Note (Signed)
Discussed with patient.  The paresthesias and the fatigue started after the fall several months ago.  The presumption at this point would be that he had a concussion.  That could have caused the paresthesias symptoms.  Unclear if the fatigue is also related to this.  Discussed with patient about options.  I want to change one thing at a time.  Stop Januvia today based on his sugar.  Depending on how he feels and depending on his blood pressure, we may need to lower his blood pressure medicines in the future.  Discussed rationale with patient.  He is not lightheaded.  Given the paresthesias, check routine labs today.  See notes on labs.  He agrees. >25 minutes spent in face to face time with patient, >50% spent in counselling or coordination of care.

## 2018-03-17 DIAGNOSIS — H401131 Primary open-angle glaucoma, bilateral, mild stage: Secondary | ICD-10-CM | POA: Diagnosis not present

## 2018-03-25 ENCOUNTER — Telehealth: Payer: Self-pay | Admitting: *Deleted

## 2018-03-25 DIAGNOSIS — R202 Paresthesia of skin: Secondary | ICD-10-CM

## 2018-03-25 NOTE — Telephone Encounter (Signed)
I would have him see neuro.  I put in the referral.  Let me know if they have other questions.

## 2018-03-25 NOTE — Telephone Encounter (Signed)
Copied from [place] [id number]. Topic: Inquiry >> Mar 25, 2018 10:14 AM Conception Chancy, NT wrote: Reason for CRM: patient wife is calling and states the patient was seen on 03/13/18 and they discussed the tingling in the patients feet and legs and she states it has gotten worse and would like for Dr. Damita Dunnings to give her a call and discuss being referred to a Neurologist.

## 2018-03-26 NOTE — Telephone Encounter (Signed)
Wife advised that referral had been made and referral coordinators will call the patient prior to actually scheduling an appointment.

## 2018-03-26 NOTE — Telephone Encounter (Signed)
Left detailed message on voicemail of wife Juliann Pulse)

## 2018-03-26 NOTE — Telephone Encounter (Signed)
Juliann Pulse, patient's wife, calling and would like to know who the referral was placed to. Please advise.

## 2018-04-10 ENCOUNTER — Ambulatory Visit: Payer: Medicare HMO | Admitting: Neurology

## 2018-04-10 ENCOUNTER — Telehealth: Payer: Self-pay | Admitting: Neurology

## 2018-04-10 ENCOUNTER — Encounter: Payer: Self-pay | Admitting: Neurology

## 2018-04-10 VITALS — BP 139/75 | HR 80 | Ht 70.0 in | Wt 212.0 lb

## 2018-04-10 DIAGNOSIS — E538 Deficiency of other specified B group vitamins: Secondary | ICD-10-CM

## 2018-04-10 DIAGNOSIS — R270 Ataxia, unspecified: Secondary | ICD-10-CM | POA: Diagnosis not present

## 2018-04-10 DIAGNOSIS — G3281 Cerebellar ataxia in diseases classified elsewhere: Secondary | ICD-10-CM

## 2018-04-10 DIAGNOSIS — W19XXXA Unspecified fall, initial encounter: Secondary | ICD-10-CM | POA: Diagnosis not present

## 2018-04-10 DIAGNOSIS — R292 Abnormal reflex: Secondary | ICD-10-CM | POA: Diagnosis not present

## 2018-04-10 DIAGNOSIS — R339 Retention of urine, unspecified: Secondary | ICD-10-CM | POA: Diagnosis not present

## 2018-04-10 DIAGNOSIS — E539 Vitamin B deficiency, unspecified: Secondary | ICD-10-CM | POA: Diagnosis not present

## 2018-04-10 DIAGNOSIS — R258 Other abnormal involuntary movements: Secondary | ICD-10-CM

## 2018-04-10 DIAGNOSIS — R799 Abnormal finding of blood chemistry, unspecified: Secondary | ICD-10-CM | POA: Diagnosis not present

## 2018-04-10 DIAGNOSIS — R200 Anesthesia of skin: Secondary | ICD-10-CM

## 2018-04-10 NOTE — Telephone Encounter (Signed)
Just an FYI  The Ct's are pending I started the case I faxed clinical notes and marked it urgent.

## 2018-04-10 NOTE — Progress Notes (Addendum)
[facility]    Provider:  Dr Jaynee Eagles Referring Provider: [name], MD Primary Care Physician:  [name], MD  CC:  Weakness, fall  HPI:  Jorge Adams is a 67 y.o. male here as requested by Dr. Damita Dunnings for Paresthesias.  Past medical history diabetes. He fell, mechanical fall, fell on arm elbows arms under him and on the knees. He felt fine afterwards. No head trauma, no bruises or bleeding. When he got up his hands felt numb, never before had numbness or tingling or weakness in the hands. Its worsened since then. His hands feel weak, his fingers are tingly and numb. No difficulty opening opening jars or fine motor. He is more fatigued, he is sleeping more. No snoring, getting enough sleep maybe too much. His legs feel weak, new since the fall, no neck pain, he has had numbing and tingling in the feet but this is not new. Denied syncope or dizziness and has had his carotid arteries checked in the last several years as well as echo. No tremors. Wife is here and provides much information.    Reviewed cardiac studies from 2017:    carotid artery report from 2017: No hemodynamically significant stenosis of carotid arteries.  Echo 09/2015: Study Conclusions  - Left ventricle: Systolic function was normal. The estimated   ejection fraction was in the range of 50% to 55%. Wall motion was   normal; there were no regional wall motion abnormalities. - Aortic valve: No evidence of vegetation. - Mitral valve: No evidence of vegetation. - Left atrium: No evidence of thrombus in the appendage. - Right atrium: No evidence of thrombus. - Atrial septum: No defect or patent foramen ovale was identified.  Impressions:  - Normal study.  Review of Systems: Patient complains of symptoms per HPI as well as the following symptoms walking difficulty, weakness. Pertinent negatives and positives per HPI. All others negative.   Social History   Socioeconomic History   . Marital status: Married    Spouse name: Not on file  . Number of children: Not on file  . Years of education: Not on file  . Highest education level: Not on file  Occupational History  . Not on file  Social Needs  . Financial resource strain: Not on file  . Food insecurity:    Worry: Not on file    Inability: Not on file  . Transportation needs:    Medical: Not on file    Non-medical: Not on file  Tobacco Use  . Smoking status: Former Smoker    Last attempt to quit: 12/02/2002    Years since quitting: 15.3  . Smokeless tobacco: Never Used  Substance and Sexual Activity  . Alcohol use: Yes    Comment: rare  . Drug use: No  . Sexual activity: Not on file  Lifestyle  . Physical activity:    Days per week: Not on file    Minutes per session: Not on file  . Stress: Not on file  Relationships  . Social connections:    Talks on phone: Not on file    Gets together: Not on file    Attends religious service: Not on file    Active member of club or organization: Not on file    Attends meetings of clubs or organizations: Not on file    Relationship status: Not on file  . Intimate partner violence:    Fear of current or ex partner: Not on file  Emotionally abused: Not on file    Physically abused: Not on file    Forced sexual activity: Not on file  Other Topics Concern  . 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    Doristine Bosworth, [employer] fan    Family History  Problem Relation Age of Onset  . Bone cancer Mother   . Pneumonia Father   . Diabetes Brother   . Heart disease Brother   . COPD Brother   . Colon cancer Neg Hx   . Prostate cancer Neg Hx     Past Medical History:  Diagnosis Date  . Arthritis   . ASHD (arteriosclerotic heart disease)   . Atherosclerosis of native arteries of the extremities with intermittent  claudication   . Cataract   . Coronary atherosclerosis of native coronary artery   . Diabetes mellitus    NIDDM  . Dyslipidemia   . ED (erectile dysfunction)   . Full dentures   . Glaucoma associated with ocular disorder, mild stage    bilateral  . Hypertension    under control, has been on med. > 9 yr.  . Impotence of organic origin   . Meniscus tear 05/2012   right knee  . Myocardial infarction ([place]) 10/2002  . Stented coronary artery     Past Surgical History:  Procedure Laterality Date  . CARDIAC CATHETERIZATION  10/23/2002   had 1 stent placed then-- by Dr. Melvern Banker  . CARDIAC CATHETERIZATION N/A 12/21/2015   Procedure: Left Heart Cath and Coronary Angiography;  Surgeon: Adrian Prows, MD;  Location: [facility];  Service: Cardiovascular;  Laterality: N/A;  . CATARACT EXTRACTION Right 05/2017  . CATARACT EXTRACTION Left 07/2017  . COLONOSCOPY    . CORONARY ARTERY BYPASS GRAFT N/A 01/02/2016   Procedure: CORONARY ARTERY BYPASS GRAFTING (CABG) TIMES 3 USING LEFT INTERNAL MAMMARY ARTERY AND RIGHT SAPHENOUS LEG VEIN HARVESTED ENDOSCOPICALLY;  Surgeon: Ivin Poot, MD;  Location: [place] [facility];  Service: Open Heart Surgery;  Laterality: N/A;  . INSERTION OF MESH N/A 05/06/2016   Procedure: INSERTION OF MESH;  Surgeon: Georganna Skeans, MD;  Location: [place];  Service: General;  Laterality: N/A;  . KNEE ARTHROSCOPY WITH MEDIAL MENISECTOMY  06/04/2012   Procedure: KNEE ARTHROSCOPY WITH MEDIAL MENISECTOMY;  Surgeon: Ninetta Lights, MD;  Location: [facility];  Service: Orthopedics;  Laterality: Right;  RIGHT SCOPE MEDIAL MENISCECTOMY, CHONDROPLASTY, EXCISION LOOSE BODY  . LOWER EXTREMITY ANGIOGRAM N/A 11/23/2013   Procedure: LOWER EXTREMITY ANGIOGRAM;  Surgeon: Laverda Page, MD;  Location: [facility];  Service: Cardiovascular;  Laterality: N/A;  . PERIPHERAL VASCULAR CATHETERIZATION N/A 12/21/2015   Procedure: Abdominal Aortogram;  Surgeon: Adrian Prows, MD;  Location: [facility];  Service: Cardiovascular;  Laterality: N/A;  . PERIPHERAL VASCULAR CATHETERIZATION N/A 12/21/2015   Procedure: Abdominal Aortogram w/Lower Extremity;  Surgeon: Adrian Prows, MD;  Location: [facility];  Service: Cardiovascular;  Laterality: N/A;  . TEE WITHOUT CARDIOVERSION N/A 01/02/2016   Procedure: TRANSESOPHAGEAL ECHOCARDIOGRAM (TEE);  Surgeon: Ivin Poot, MD;  Location: [facility];  Service: Open Heart Surgery;  Laterality: N/A;  . UMBILICAL HERNIA REPAIR N/A 05/06/2016   Procedure: HERNIA REPAIR UMBILICAL ADULT;  Surgeon: Georganna Skeans, MD;  Location: [facility];  Service: General;  Laterality: N/A;    Current Outpatient Medications  Medication Sig Dispense Refill  . amLODipine-benazepril (LOTREL) 5-20 MG capsule Take 1 capsule by mouth daily.    Marland Kitchen aspirin EC 81 MG tablet Take 81 mg by mouth daily.    . brimonidine (ALPHAGAN P) 0.1 % SOLN Place 1 drop into both eyes 2 (two) times daily.    . cilostazol (PLETAL) 100 MG tablet Take 1 tablet (100 mg total) by mouth 2 (two) times daily. 180 tablet 3  . dorzolamide-timolol (COSOPT) 22.3-6.8 MG/ML ophthalmic solution Place 1 drop into both eyes 2 (two) times daily.    . fenofibrate (TRICOR) 145 MG tablet Take 145 mg by mouth daily.    . metFORMIN (GLUCOPHAGE) 1000 MG tablet Take 1 tablet (1,000 mg total) by mouth 2 (two) times daily. 180 tablet 3  . metoprolol tartrate (LOPRESSOR) 25 MG tablet Take 25 mg by mouth 2 (two) times daily.    . nitroGLYCERIN (NITROSTAT) 0.4 MG SL tablet PLACE 1 TABLET UNDER TONGUE EVERY 5 MINUTES AS NEEDED FOR CHEST PAIN.  2  . omega-3 acid ethyl esters (LOVAZA) 1 g capsule Take 1 capsule by mouth 2 (two) times daily.    Glory Rosebush DELICA LANCETS 09O MISC USE DAILY AS NEEDED 100 each 3  . ONETOUCH VERIO test strip USE DAILY AS NEEDED 100 each 3  . rosuvastatin (CRESTOR) 20 MG tablet Take 20 mg by mouth daily.     . TRAVATAN Z 0.004 % SOLN ophthalmic solution Place 1 drop into both eyes at bedtime.     No  current facility-administered medications for this visit.     Allergies as of 04/10/2018 - Review Complete 04/10/2018  Allergen Reaction Noted  . No known allergies  05/03/2016    Vitals: BP 139/75   Pulse 80   Ht 5\' 10"  (1.778 m)   Wt 212 lb (96.2 kg)   BMI 30.42 kg/m  Last Weight:  Wt Readings from Last 1 Encounters:  04/10/18 212 lb (96.2 kg)   Last Height:   Ht Readings from Last 1 Encounters:  04/10/18 5\' 10"  (1.778 m)    Physical exam: Exam: Gen: NAD, conversant, well nourised, obese, well groomed                     CV: RRR, no MRG. No Carotid Bruits. No peripheral edema, warm, nontender Eyes: Conjunctivae clear without exudates or hemorrhage  Neuro: Detailed Neurologic Exam  Speech:    Speech is normal; fluent and spontaneous with normal comprehension.  Cognition:    The patient is oriented to person, place, and time;     recent and remote memory intact;     language fluent;     normal attention, concentration,     fund of knowledge Cranial Nerves:    The pupils are equal, round, and reactive to light. The fundi are normal and spontaneous venous pulsations are present. Visual fields are full to finger confrontation. Extraocular movements are intact. Trigeminal sensation is intact and the muscles of mastication are normal. The face is symmetric. The palate elevates in the midline. Hearing intact. Voice is normal. Shoulder shrug is normal. The tongue has normal motion without fasciculations.   Coordination:    Normal finger to nose and heel to shin. Normal rapid alternating movements.   Gait:    Heel-toe and tandem gait are abnormal, imbalance. Wide based, good arm swing, hyperlordosis (large pannus an dbody habitus?), slightly low clearance of feet endenbloc turning  Motor Observation:    No asymmetry, no atrophy,  and no involuntary movements noted. Tone:    Increased lowers R>> L  Posture:    Posture is normal. normal erect    Strength: 4+/5 bilat  triceps, 4/5 bilat hip flexion,     Strength is V/V in the upper and lower limbs.      Sensation: intact to LT, +Romberg     Reflex Exam:  DTR's:    Deep tendon reflexes in the upper and lower extremities are brisk bilaterally.   Toes:    The toes are upgoing bilaterally.   Clonus:    3 beats left AJ, 2 beats right AJ       Assessment/Plan:  Very concerning symptoms of increased tone, bilateral upgoing toes, paresthesias, arm and leg weakness, ataxia since a fall 2 months ago. Needs stat MRI brain and cervical spine for hematoma or cord compression. Will send for urgent CT Head and neck today. Pending may need MRI cervical spine.   Orders Placed This Encounter  Procedures  . CT HEAD WO CONTRAST  . CT CERVICAL SPINE WO CONTRAST  . B12 and Folate Panel  . Methylmalonic acid, serum  . Basic Metabolic Panel     Sarina Ill, MD  [facility] [address] [place], [place] [address]  Phone [phone] Fax [phone]

## 2018-04-10 NOTE — Addendum Note (Signed)
Addended by: Sarina Ill B on: 04/10/2018 12:42 PM   Modules accepted: Orders

## 2018-04-13 ENCOUNTER — Other Ambulatory Visit: Payer: Self-pay | Admitting: Neurology

## 2018-04-13 ENCOUNTER — Ambulatory Visit
Admission: RE | Admit: 2018-04-13 | Discharge: 2018-04-13 | Disposition: A | Discharge status: Home / Self Care | Payer: Medicare HMO | Source: Ambulatory Visit | Attending: Neurology | Admitting: Neurology

## 2018-04-13 ENCOUNTER — Telehealth: Payer: Self-pay | Admitting: *Deleted

## 2018-04-13 ENCOUNTER — Telehealth: Payer: Self-pay | Admitting: Neurology

## 2018-04-13 DIAGNOSIS — R292 Abnormal reflex: Secondary | ICD-10-CM

## 2018-04-13 DIAGNOSIS — E538 Deficiency of other specified B group vitamins: Secondary | ICD-10-CM

## 2018-04-13 DIAGNOSIS — R200 Anesthesia of skin: Secondary | ICD-10-CM

## 2018-04-13 DIAGNOSIS — R270 Ataxia, unspecified: Secondary | ICD-10-CM

## 2018-04-13 DIAGNOSIS — M4802 Spinal stenosis, cervical region: Secondary | ICD-10-CM

## 2018-04-13 DIAGNOSIS — R258 Other abnormal involuntary movements: Secondary | ICD-10-CM

## 2018-04-13 DIAGNOSIS — G3281 Cerebellar ataxia in diseases classified elsewhere: Secondary | ICD-10-CM | POA: Diagnosis not present

## 2018-04-13 DIAGNOSIS — M4712 Other spondylosis with myelopathy, cervical region: Secondary | ICD-10-CM

## 2018-04-13 DIAGNOSIS — W19XXXA Unspecified fall, initial encounter: Secondary | ICD-10-CM

## 2018-04-13 DIAGNOSIS — G959 Disease of spinal cord, unspecified: Secondary | ICD-10-CM

## 2018-04-13 DIAGNOSIS — R339 Retention of urine, unspecified: Secondary | ICD-10-CM

## 2018-04-13 NOTE — Telephone Encounter (Signed)
Pending faxed clinical notes

## 2018-04-13 NOTE — Telephone Encounter (Signed)
[employer] Josem Kaufmann: [id number] (exp. 04/10/18 to 07/09/18) spoke to patient and made him aware of the exams being approved and that he can walk in at [facility] and have them done.. I also spoke to [place] at [place] to make sure this is okay and she stated yes.

## 2018-04-13 NOTE — Telephone Encounter (Signed)
Called patient and informed him that his CT cervical spine shows that he likely has severe spinal stenosis. Advised him Dr Jaynee Eagles stated he needs an MRI cervical spine and referral to neurosurgery.  Advised him she will order the MRI and place referral to neurosurgery.  The patient stated that Dr Jaynee Eagles has called him with this information. MRI is waiting for insurance authorization. This RN advised he'll get a call to schedule MRI when authorization is complete. He verbalized understanding, appreciation.

## 2018-04-14 LAB — B12 AND FOLATE PANEL
FOLATE: 4.9 ng/mL (ref >3.0)
Vitamin B-12: 382 pg/mL (ref 232–1245)

## 2018-04-14 LAB — METHYLMALONIC ACID, SERUM: METHYLMALONIC ACID: 144 nmol/L (ref 0–378)

## 2018-04-14 LAB — BASIC METABOLIC PANEL
BUN/Creatinine Ratio: 9 — ABNORMAL LOW (ref 10–24)
BUN: 8 mg/dL (ref 8–27)
CO2: 22 mmol/L (ref 20–29)
CREATININE: 0.87 mg/dL (ref 0.76–1.27)
Calcium: 9.6 mg/dL (ref 8.6–10.2)
Chloride: 104 mmol/L (ref 96–106)
GFR calc Af Amer: 103 mL/min/{1.73_m2} (ref >59)
GFR calc non Af Amer: 89 mL/min/{1.73_m2} (ref >59)
GLUCOSE: 126 mg/dL — AB (ref 65–99)
Potassium: 4.8 mmol/L (ref 3.5–5.2)
SODIUM: 143 mmol/L (ref 134–144)

## 2018-04-14 NOTE — Telephone Encounter (Signed)
[employer] medicare Josem Kaufmann: [id number] (exp. 04/13/18 to 07/12/18) patient is scheduled at GI for 04/15/18.

## 2018-04-15 ENCOUNTER — Telehealth: Payer: Self-pay | Admitting: Neurology

## 2018-04-15 ENCOUNTER — Ambulatory Visit
Admission: RE | Admit: 2018-04-15 | Discharge: 2018-04-15 | Disposition: A | Discharge status: Home / Self Care | Payer: Medicare HMO | Source: Ambulatory Visit | Attending: Neurology | Admitting: Neurology

## 2018-04-15 ENCOUNTER — Other Ambulatory Visit: Payer: Self-pay | Admitting: Neurology

## 2018-04-15 DIAGNOSIS — G959 Disease of spinal cord, unspecified: Secondary | ICD-10-CM

## 2018-04-15 DIAGNOSIS — G992 Myelopathy in diseases classified elsewhere: Secondary | ICD-10-CM | POA: Diagnosis not present

## 2018-04-15 DIAGNOSIS — R202 Paresthesia of skin: Secondary | ICD-10-CM | POA: Diagnosis not present

## 2018-04-15 DIAGNOSIS — M4802 Spinal stenosis, cervical region: Secondary | ICD-10-CM

## 2018-04-15 DIAGNOSIS — R270 Ataxia, unspecified: Secondary | ICD-10-CM

## 2018-04-15 DIAGNOSIS — Z6830 Body mass index (BMI) 30.0-30.9, adult: Secondary | ICD-10-CM | POA: Diagnosis not present

## 2018-04-15 NOTE — Telephone Encounter (Signed)
Called patient, he has already been to Neurosurgery and he is going to have surgery in 2 weeks with Dr. Kathyrn Sheriff.

## 2018-04-16 ENCOUNTER — Other Ambulatory Visit: Payer: Self-pay | Admitting: Neurosurgery

## 2018-04-27 NOTE — Pre-Procedure Instructions (Signed)
HUMBERTO ADDO  04/27/2018      [id number] - [address], [address] [place] [address] Phone: [phone] Fax: [phone]    Your procedure is scheduled on 05/07/2018.  Report to [facility] Admitting at 1045 A.M.  Call this number if you have problems the morning of surgery:  [phone]   Remember:  Do not eat or drink after midnight.     Take these medicines the morning of surgery with A SIP OF WATER: Brimonidine (Alphagan) eye drop Dorzolamide-timolol (Cosopt) eyed drop Metoprolol tartrate (Lopressor)  7 days prior to surgery STOP taking any Aspirin (unless otherwise instructed by your surgeon), Aleve, Naproxen, Ibuprofen, Motrin, Advil, Goody's, BC's, all herbal medications, fish oil, and all vitamins  Follow your surgeon's instructions on when to stop Asprin.  If no instructions were given by your surgeon then you will need to call the office to get those instructions.    WHAT DO I DO ABOUT MY DIABETES MEDICATION? Marland Kitchen Do not take oral diabetes medicines (pills) the morning of surgery.    How to Manage Your Diabetes Before and After Surgery  Why is it important to control my blood sugar before and after surgery? . Improving blood sugar levels before and after surgery helps healing and can limit problems. . A way of improving blood sugar control is eating a healthy diet by: o  Eating less sugar and carbohydrates o  Increasing activity/exercise o  Talking with your doctor about reaching your blood sugar goals . High blood sugars (greater than 180 mg/dL) can raise your risk of infections and slow your recovery, so you will need to focus on controlling your diabetes during the weeks before surgery. . Make sure that the doctor who takes care of your diabetes knows about your planned surgery including the date and location.  How do I manage my blood sugar before surgery? . Check your blood sugar at least 4 times  a day, starting 2 days before surgery, to make sure that the level is not too high or low. o Check your blood sugar the morning of your surgery when you wake up and every 2 hours until you get to the Short Stay unit. . If your blood sugar is less than 70 mg/dL, you will need to treat for low blood sugar: o Do not take insulin. o Treat a low blood sugar (less than 70 mg/dL) with  cup of clear juice (cranberry or apple), 4 glucose tablets, OR glucose gel. o Recheck blood sugar in 15 minutes after treatment (to make sure it is greater than 70 mg/dL). If your blood sugar is not greater than 70 mg/dL on recheck, call [phone] for further instructions. . Report your blood sugar to the short stay nurse when you get to Short Stay.  . If you are admitted to the hospital after surgery: o Your blood sugar will be checked by the staff and you will probably be given insulin after surgery (instead of oral diabetes medicines) to make sure you have good blood sugar levels. o The goal for blood sugar control after surgery is 80-180 mg/dL.      Do not wear jewelry.  Do not wear lotions, powders, or colognes, or deodorant.  Do not shave 48 hours prior to surgery.  Men may shave face and neck.  Do not bring valuables to the hospital.  [facility] is not responsible for any belongings or valuables.  Contacts, eyeglasses, hearing  aids, dentures or bridgework may not be worn into surgery.  Leave your suitcase in the car.  After surgery it may be brought to your room.  For patients admitted to the hospital, discharge time will be determined by your treatment team.  Patients discharged the day of surgery will not be allowed to drive home.   Name and phone number of your driver:   Special instructions:   [facility]- Preparing For Surgery  Before surgery, you can play an important role. Because skin is not sterile, your skin needs to be as free of germs as possible. You can reduce the number of germs on  your skin by washing with CHG (chlorahexidine gluconate) Soap before surgery.  CHG is an antiseptic cleaner which kills germs and bonds with the skin to continue killing germs even after washing.    Oral Hygiene is also important to reduce your risk of infection.  Remember - BRUSH YOUR TEETH THE MORNING OF SURGERY WITH YOUR REGULAR TOOTHPASTE  Please do not use if you have an allergy to CHG or antibacterial soaps. If your skin becomes reddened/irritated stop using the CHG.  Do not shave (including legs and underarms) for at least 48 hours prior to first CHG shower. It is OK to shave your face.  Please follow these instructions carefully.   1. Shower the NIGHT BEFORE SURGERY and the MORNING OF SURGERY with CHG.   2. If you chose to wash your hair, wash your hair first as usual with your normal shampoo.  3. After you shampoo, rinse your hair and body thoroughly to remove the shampoo.  4. Use CHG as you would any other liquid soap. You can apply CHG directly to the skin and wash gently with a scrungie or a clean washcloth.   5. Apply the CHG Soap to your body ONLY FROM THE NECK DOWN.  Do not use on open wounds or open sores. Avoid contact with your eyes, ears, mouth and genitals (private parts). Wash Face and genitals (private parts)  with your normal soap.  6. Wash thoroughly, paying special attention to the area where your surgery will be performed.  7. Thoroughly rinse your body with warm water from the neck down.  8. DO NOT shower/wash with your normal soap after using and rinsing off the CHG Soap.  9. Pat yourself dry with a CLEAN TOWEL.  10. Wear CLEAN PAJAMAS to bed the night before surgery, wear comfortable clothes the morning of surgery  11. Place CLEAN SHEETS on your bed the night of your first shower and DO NOT SLEEP WITH PETS.    Day of Surgery: Shower as stated above. Do not apply any deodorants/lotions.  Please wear clean clothes to the hospital/surgery center.    Remember to brush your teeth WITH YOUR REGULAR TOOTHPASTE.    Please read over the following fact sheets that you were given.

## 2018-04-28 ENCOUNTER — Other Ambulatory Visit: Payer: Self-pay

## 2018-04-28 ENCOUNTER — Encounter (HOSPITAL_COMMUNITY)
Admission: RE | Admit: 2018-04-28 | Discharge: 2018-04-28 | Disposition: A | Discharge status: Home / Self Care | Payer: Medicare HMO | Source: Ambulatory Visit | Attending: Neurosurgery | Admitting: Neurosurgery

## 2018-04-28 DIAGNOSIS — Z955 Presence of coronary angioplasty implant and graft: Secondary | ICD-10-CM | POA: Insufficient documentation

## 2018-04-28 DIAGNOSIS — M2578 Osteophyte, vertebrae: Secondary | ICD-10-CM | POA: Diagnosis not present

## 2018-04-28 DIAGNOSIS — I252 Old myocardial infarction: Secondary | ICD-10-CM | POA: Insufficient documentation

## 2018-04-28 DIAGNOSIS — Z87891 Personal history of nicotine dependence: Secondary | ICD-10-CM | POA: Insufficient documentation

## 2018-04-28 DIAGNOSIS — Z7984 Long term (current) use of oral hypoglycemic drugs: Secondary | ICD-10-CM | POA: Insufficient documentation

## 2018-04-28 DIAGNOSIS — M1990 Unspecified osteoarthritis, unspecified site: Secondary | ICD-10-CM | POA: Insufficient documentation

## 2018-04-28 DIAGNOSIS — M50022 Cervical disc disorder at C5-C6 level with myelopathy: Secondary | ICD-10-CM | POA: Diagnosis not present

## 2018-04-28 DIAGNOSIS — E119 Type 2 diabetes mellitus without complications: Secondary | ICD-10-CM | POA: Diagnosis not present

## 2018-04-28 DIAGNOSIS — H409 Unspecified glaucoma: Secondary | ICD-10-CM | POA: Insufficient documentation

## 2018-04-28 DIAGNOSIS — Z01812 Encounter for preprocedural laboratory examination: Secondary | ICD-10-CM | POA: Diagnosis present

## 2018-04-28 DIAGNOSIS — I10 Essential (primary) hypertension: Secondary | ICD-10-CM | POA: Diagnosis not present

## 2018-04-28 DIAGNOSIS — Z951 Presence of aortocoronary bypass graft: Secondary | ICD-10-CM | POA: Insufficient documentation

## 2018-04-28 DIAGNOSIS — M4802 Spinal stenosis, cervical region: Secondary | ICD-10-CM | POA: Diagnosis not present

## 2018-04-28 DIAGNOSIS — Z7982 Long term (current) use of aspirin: Secondary | ICD-10-CM | POA: Diagnosis not present

## 2018-04-28 DIAGNOSIS — I2510 Atherosclerotic heart disease of native coronary artery without angina pectoris: Secondary | ICD-10-CM | POA: Insufficient documentation

## 2018-04-28 DIAGNOSIS — Z79899 Other long term (current) drug therapy: Secondary | ICD-10-CM | POA: Diagnosis not present

## 2018-04-28 DIAGNOSIS — M4712 Other spondylosis with myelopathy, cervical region: Secondary | ICD-10-CM | POA: Insufficient documentation

## 2018-04-28 DIAGNOSIS — E785 Hyperlipidemia, unspecified: Secondary | ICD-10-CM | POA: Diagnosis not present

## 2018-04-28 LAB — CBC
HEMATOCRIT: 42.4 % (ref 39.0–52.0)
HEMOGLOBIN: 14.1 g/dL (ref 13.0–17.0)
MCH: 29.1 pg (ref 26.0–34.0)
MCHC: 33.3 g/dL (ref 30.0–36.0)
MCV: 87.6 fL (ref 80.0–100.0)
Platelets: 152 10*3/uL (ref 150–400)
RBC: 4.84 MIL/uL (ref 4.22–5.81)
RDW: 14.5 % (ref 11.5–15.5)
WBC: 6.2 10*3/uL (ref 4.0–10.5)
nRBC: 0 % (ref 0.0–0.2)

## 2018-04-28 LAB — TYPE AND SCREEN
ABO/RH(D): O POS
Antibody Screen: NEGATIVE

## 2018-04-28 LAB — BASIC METABOLIC PANEL
ANION GAP: 8 (ref 5–15)
BUN: 8 mg/dL (ref 8–23)
CHLORIDE: 106 mmol/L (ref 98–111)
CO2: 24 mmol/L (ref 22–32)
Calcium: 9.1 mg/dL (ref 8.9–10.3)
Creatinine, Ser: 0.76 mg/dL (ref 0.61–1.24)
GFR calc Af Amer: >60 mL/min (ref >60)
GFR calc non Af Amer: >60 mL/min (ref >60)
Glucose, Bld: 172 mg/dL — ABNORMAL HIGH (ref 70–99)
POTASSIUM: 3.6 mmol/L (ref 3.5–5.1)
Sodium: 138 mmol/L (ref 135–145)

## 2018-04-28 LAB — SURGICAL PCR SCREEN
MRSA, PCR: NEGATIVE
Staphylococcus aureus: NEGATIVE

## 2018-04-28 LAB — GLUCOSE, CAPILLARY: Glucose-Capillary: 168 mg/dL — ABNORMAL HIGH (ref 70–99)

## 2018-04-28 LAB — HEMOGLOBIN A1C
HEMOGLOBIN A1C: 5.7 % — AB (ref 4.8–5.6)
Mean Plasma Glucose: 116.89 mg/dL

## 2018-04-28 NOTE — Progress Notes (Addendum)
Anesthesia Chart Review:  Case:  [id number] Date/Time:  05/07/18 1230   Procedure:  ANTERIOR CERVICAL DECOMPRESSION/DISCECTOMY FUSION CERVICAL 5- CERVICAL 6 (N/A ) - ANTERIOR CERVICAL DECOMPRESSION/DISCECTOMY FUSION CERVICAL 5- CERVICAL 6   Anesthesia type:  General   Pre-op diagnosis:  STENOSIS OF CERVICAL SPINE WITH MYELOPATHY   Location:  [facility] OR ROOM 18 / [facility] OR   Surgeon:  Consuella Lose, MD      DISCUSSION: 67 yo former smoker. Pertinent hx includes HTN, DMII, MI '04, CAD s/p CABG (LIMA-LAD, SVG-OM1, SVG-PDA 01/02/16, post-CABG afib converted to SR on amiodarone).  Patient states that he can only take pills with food of some kind.  Patient takes Lopressor and may need for it to be given IV day of surgery. Dr. Marcie Bal advised this does not need to be ordered preop, will be assessed on DOS.  Pending cardiac clearance by Dr. Einar Gip.  Addendum 05/05/2018: Cardiac clearance by Dr. Einar Gip dated 04/30/2018 states pt is at low risk and may hold ASA 6d preop.  VS: BP 122/75   Pulse 77   Temp 36.4 C (Oral)   Resp 18   Ht 5' 10.5" (1.791 m)   Wt 96.9 kg   SpO2 97%   BMI 30.23 kg/m   PROVIDERS: [name], MD is PCP  Adrian Prows, MD is Cardiologist last seen 02/16/2018 and recommended 50yr followup.  LABS: Labs reviewed: Acceptable for surgery. (all labs ordered are listed, but only abnormal results are displayed)  Labs Reviewed  GLUCOSE, CAPILLARY - Abnormal; Notable for the following components:      Result Value   Glucose-Capillary 168 (*)    All other components within normal limits  HEMOGLOBIN A1C - Abnormal; Notable for the following components:   Hgb A1c MFr Bld 5.7 (*)    All other components within normal limits  BASIC METABOLIC PANEL - Abnormal; Notable for the following components:   Glucose, Bld 172 (*)    All other components within normal limits  SURGICAL PCR SCREEN  CBC  TYPE AND SCREEN     IMAGES: MRI Cspine 04/15/2018: IMPRESSION: Abnormal MRI scan of  the cervical spine showing severe disc degenerative change at C5-6 with broad-based disc osteophyte protrusion resulting in severe cord compression and bilateral right greater than left foraminal narrowing.  There is also mild canal and moderate bilateral foraminal narrowing at C6-7  CHEST  2 VIEW 02/12/2016 COMPARISON:  PA and lateral chest x-ray of January 09, 2016 FINDINGS: The right lung is well-expanded and clear. On the left patchy interstitial density in the mid lung has nearly totally resolved. There is no pleural effusion or pneumothorax. The cardiac silhouette remains top-normal in size. The pulmonary vascularity is normal. The sternal wires are intact. IMPRESSION: Further interval improvement in the appearance of the chest since the previous study with resolution of small left pleural effusion and near total resolution of patchy peripheral atelectasis. No CHF.  EKG: 01/26/2018 (outside record, copy on pt chart): Sinus rhythm, incomplete LBBB, short QT, poor r wave progression.  CV: TEE 01/02/2016: Study Conclusions  - Left ventricle: Systolic function was normal. The estimated   ejection fraction was in the range of 50% to 55%. Wall motion was   normal; there were no regional wall motion abnormalities. - Aortic valve: No evidence of vegetation. - Mitral valve: No evidence of vegetation. - Left atrium: No evidence of thrombus in the appendage. - Right atrium: No evidence of thrombus. - Atrial septum: No defect or patent foramen ovale  was identified.  Impressions:  - Normal study.  01/01/16 Carotid U/S:  Findings consistent with 1- 39 percent stenosis involving the right internal carotid artery and the left internal carotidartery.   Cath 12/21/2015: 1.  Normal LV systolic function, EF 62-37%. 2. Severe triple-vessel coronary artery disease.  Long segment occlusion, CTOof proximal mid distal RCA,large branches collateralized by Type III collaterals from the distal LAD  Continuing through PDA branch, several septal perforators giving collaterals to the distal RCA. 3.  Proximal LAD with severe diffuse calcification and tandem 80-85% stenosis.  Mid to distal LAD has mild disease, tortuous. Moderate-sized diagonals with mild disease. 4. Circumflex coronary artery is a moderate-sized vessel. Mid-segment with a 40% stenosis at the origin of a large OM1 which has 3 secondary branches.  This OM1 has a old stent which is widely patent in the proximal segment however the ostium of the OM 1 has a high-grade 90% stenosis. 5.   Abdominal aortogram with limited bifemoral arteriogram  Revealing mild disease in the abdominal aorta and in the aortoiliac bifurcation and proximal common femoral artery.  Proximal SFA is widely patent bilaterally.  Mild calcification is evident.  Recommendation:Patient's completely vascular cessation can only be achieved via CABG  Right coronary artery is not conducive for any type of revascularization including retrograde axis is extremely difficult and complex.  Hence I will refer him to be evaluated for CABG, I'll request Dr. Dahlia Byes to see and evaluate the patient.  Pt subsequently underwent CABG by Dr. Prescott Gum 01/02/2016  Past Medical History:  Diagnosis Date  . Arthritis   . ASHD (arteriosclerotic heart disease)   . Atherosclerosis of native arteries of the extremities with intermittent claudication   . Cataract   . Coronary atherosclerosis of native coronary artery   . Diabetes mellitus    NIDDM  . Dyslipidemia   . ED (erectile dysfunction)   . Full dentures   . Glaucoma associated with ocular disorder, mild stage    bilateral  . Hypertension    under control, has been on med. > 9 yr.  . Impotence of organic origin   . Meniscus tear 05/2012   right knee  . Myocardial infarction ([place]) 10/2002  . Stented coronary artery     Past Surgical History:  Procedure Laterality Date  . CARDIAC CATHETERIZATION  10/23/2002   had 1 stent  placed then-- by Dr. Melvern Banker  . CARDIAC CATHETERIZATION N/A 12/21/2015   Procedure: Left Heart Cath and Coronary Angiography;  Surgeon: Adrian Prows, MD;  Location: [facility];  Service: Cardiovascular;  Laterality: N/A;  . CATARACT EXTRACTION Right 05/2017  . CATARACT EXTRACTION Left 07/2017  . COLONOSCOPY    . CORONARY ARTERY BYPASS GRAFT N/A 01/02/2016   Procedure: CORONARY ARTERY BYPASS GRAFTING (CABG) TIMES 3 USING LEFT INTERNAL MAMMARY ARTERY AND RIGHT SAPHENOUS LEG VEIN HARVESTED ENDOSCOPICALLY;  Surgeon: Ivin Poot, MD;  Location: [facility];  Service: Open Heart Surgery;  Laterality: N/A;  . INSERTION OF MESH N/A 05/06/2016   Procedure: INSERTION OF MESH;  Surgeon: Georganna Skeans, MD;  Location: [facility];  Service: General;  Laterality: N/A;  . KNEE ARTHROSCOPY WITH MEDIAL MENISECTOMY  06/04/2012   Procedure: KNEE ARTHROSCOPY WITH MEDIAL MENISECTOMY;  Surgeon: Ninetta Lights, MD;  Location: [facility];  Service: Orthopedics;  Laterality: Right;  RIGHT SCOPE MEDIAL MENISCECTOMY, CHONDROPLASTY, EXCISION LOOSE BODY  . LOWER EXTREMITY ANGIOGRAM N/A 11/23/2013   Procedure: LOWER EXTREMITY ANGIOGRAM;  Surgeon: Laverda Page, MD;  Location: [facility] CATH LAB;  Service: Cardiovascular;  Laterality: N/A;  . PERIPHERAL VASCULAR CATHETERIZATION N/A 12/21/2015   Procedure: Abdominal Aortogram;  Surgeon: Adrian Prows, MD;  Location: [facility];  Service: Cardiovascular;  Laterality: N/A;  . PERIPHERAL VASCULAR CATHETERIZATION N/A 12/21/2015   Procedure: Abdominal Aortogram w/Lower Extremity;  Surgeon: Adrian Prows, MD;  Location: [facility];  Service: Cardiovascular;  Laterality: N/A;  . TEE WITHOUT CARDIOVERSION N/A 01/02/2016   Procedure: TRANSESOPHAGEAL ECHOCARDIOGRAM (TEE);  Surgeon: Ivin Poot, MD;  Location: [facility];  Service: Open Heart Surgery;  Laterality: N/A;  . UMBILICAL HERNIA REPAIR N/A 05/06/2016   Procedure: HERNIA REPAIR UMBILICAL ADULT;  Surgeon: Georganna Skeans,  MD;  Location: [facility];  Service: General;  Laterality: N/A;    MEDICATIONS: . amLODipine-benazepril (LOTREL) 5-20 MG capsule  . phenylephrine (NEO-SYNEPHRINE) 1 % nasal spray  . sodium chloride (OCEAN) 0.65 % SOLN nasal spray  . aspirin EC 81 MG tablet  . brimonidine (ALPHAGAN P) 0.1 % SOLN  . cilostazol (PLETAL) 100 MG tablet  . dorzolamide-timolol (COSOPT) 22.3-6.8 MG/ML ophthalmic solution  . fenofibrate (TRICOR) 145 MG tablet  . metFORMIN (GLUCOPHAGE) 1000 MG tablet  . metoprolol tartrate (LOPRESSOR) 25 MG tablet  . nitroGLYCERIN (NITROSTAT) 0.4 MG SL tablet  . omega-3 acid ethyl esters (LOVAZA) 1 g capsule  . ONETOUCH DELICA LANCETS 11W MISC  . ONETOUCH VERIO test strip  . rosuvastatin (CRESTOR) 20 MG tablet  . TRAVATAN Z 0.004 % SOLN ophthalmic solution   No current facility-administered medications for this encounter.     Wynonia Musty [facility] Short Stay Center/Anesthesiology Phone [phone] 04/30/2018 12:51 PM

## 2018-04-28 NOTE — Progress Notes (Signed)
PCP - Dr. Elsie Stain Cardiologist - Dr. Adrian Prows  Chest x-ray - n/a EKG - per patient 01/2018, requested from Dr. Einar Gip Stress Test - 12/08/2015 ECHO - 12/12/2017 Cardiac Cath - 12/21/2015  Sleep Study - patient denies  Fasting Blood Sugar - 100-110's Checks Blood Sugar 1 time a day  Blood Thinner Instructions: stop pletal 7 days prior to surgery Aspirin Instructions: stop asa 7 days prior to surgery  Anesthesia review: yes, for cardiac history.  Patient states that he can only take pills with food of some kind.  Patient takes Lopressor and will need for it to be given IV day of surgery.  Requested last office note and EKG from Dr. Irven Shelling office.  Patient denies shortness of breath, fever, cough and chest pain at PAT appointment   Patient verbalized understanding of instructions that were given to them at the PAT appointment. Patient was also instructed that they will need to review over the PAT instructions again at home before surgery.

## 2018-04-29 ENCOUNTER — Other Ambulatory Visit: Payer: Self-pay | Admitting: Neurosurgery

## 2018-05-07 ENCOUNTER — Ambulatory Visit (HOSPITAL_COMMUNITY): Payer: MEDICARE | Admitting: Certified Registered"

## 2018-05-07 ENCOUNTER — Other Ambulatory Visit: Payer: Self-pay

## 2018-05-07 ENCOUNTER — Ambulatory Visit (HOSPITAL_COMMUNITY): Payer: MEDICARE

## 2018-05-07 ENCOUNTER — Encounter (HOSPITAL_COMMUNITY)
Admission: RE | Disposition: A | Discharge status: Home / Self Care | Payer: Self-pay | Source: Home / Self Care | Attending: Neurosurgery

## 2018-05-07 ENCOUNTER — Ambulatory Visit (HOSPITAL_COMMUNITY): Payer: MEDICARE | Admitting: Vascular Surgery

## 2018-05-07 ENCOUNTER — Inpatient Hospital Stay (HOSPITAL_COMMUNITY)
Admission: RE | Admit: 2018-05-07 | Discharge: 2018-05-08 | DRG: 472 | Disposition: A | Discharge status: Home / Self Care | Payer: MEDICARE | Attending: Neurosurgery | Admitting: Neurosurgery

## 2018-05-07 ENCOUNTER — Encounter (HOSPITAL_COMMUNITY): Payer: Self-pay | Admitting: *Deleted

## 2018-05-07 DIAGNOSIS — R0683 Snoring: Secondary | ICD-10-CM | POA: Diagnosis present

## 2018-05-07 DIAGNOSIS — Z833 Family history of diabetes mellitus: Secondary | ICD-10-CM

## 2018-05-07 DIAGNOSIS — Z955 Presence of coronary angioplasty implant and graft: Secondary | ICD-10-CM

## 2018-05-07 DIAGNOSIS — H409 Unspecified glaucoma: Secondary | ICD-10-CM | POA: Diagnosis not present

## 2018-05-07 DIAGNOSIS — Z419 Encounter for procedure for purposes other than remedying health state, unspecified: Secondary | ICD-10-CM

## 2018-05-07 DIAGNOSIS — R200 Anesthesia of skin: Secondary | ICD-10-CM | POA: Diagnosis present

## 2018-05-07 DIAGNOSIS — G992 Myelopathy in diseases classified elsewhere: Secondary | ICD-10-CM | POA: Diagnosis not present

## 2018-05-07 DIAGNOSIS — Z7982 Long term (current) use of aspirin: Secondary | ICD-10-CM | POA: Diagnosis not present

## 2018-05-07 DIAGNOSIS — M2578 Osteophyte, vertebrae: Secondary | ICD-10-CM | POA: Diagnosis not present

## 2018-05-07 DIAGNOSIS — Z79899 Other long term (current) drug therapy: Secondary | ICD-10-CM

## 2018-05-07 DIAGNOSIS — Z7984 Long term (current) use of oral hypoglycemic drugs: Secondary | ICD-10-CM

## 2018-05-07 DIAGNOSIS — M4802 Spinal stenosis, cervical region: Principal | ICD-10-CM | POA: Diagnosis present

## 2018-05-07 DIAGNOSIS — I152 Hypertension secondary to endocrine disorders: Secondary | ICD-10-CM | POA: Diagnosis present

## 2018-05-07 DIAGNOSIS — N529 Male erectile dysfunction, unspecified: Secondary | ICD-10-CM | POA: Diagnosis not present

## 2018-05-07 DIAGNOSIS — E785 Hyperlipidemia, unspecified: Secondary | ICD-10-CM | POA: Diagnosis not present

## 2018-05-07 DIAGNOSIS — I10 Essential (primary) hypertension: Secondary | ICD-10-CM | POA: Diagnosis not present

## 2018-05-07 DIAGNOSIS — G959 Disease of spinal cord, unspecified: Secondary | ICD-10-CM | POA: Diagnosis not present

## 2018-05-07 DIAGNOSIS — I252 Old myocardial infarction: Secondary | ICD-10-CM

## 2018-05-07 DIAGNOSIS — I2510 Atherosclerotic heart disease of native coronary artery without angina pectoris: Secondary | ICD-10-CM | POA: Diagnosis present

## 2018-05-07 DIAGNOSIS — E1169 Type 2 diabetes mellitus with other specified complication: Secondary | ICD-10-CM | POA: Diagnosis present

## 2018-05-07 DIAGNOSIS — Z8249 Family history of ischemic heart disease and other diseases of the circulatory system: Secondary | ICD-10-CM

## 2018-05-07 DIAGNOSIS — Z87891 Personal history of nicotine dependence: Secondary | ICD-10-CM | POA: Diagnosis not present

## 2018-05-07 DIAGNOSIS — Z951 Presence of aortocoronary bypass graft: Secondary | ICD-10-CM

## 2018-05-07 DIAGNOSIS — M4322 Fusion of spine, cervical region: Secondary | ICD-10-CM | POA: Diagnosis not present

## 2018-05-07 HISTORY — PX: ANTERIOR CERVICAL DECOMP/DISCECTOMY FUSION: SHX1161

## 2018-05-07 LAB — GLUCOSE, CAPILLARY
GLUCOSE-CAPILLARY: 106 mg/dL — AB (ref 70–99)
GLUCOSE-CAPILLARY: 157 mg/dL — AB (ref 70–99)
Glucose-Capillary: 113 mg/dL — ABNORMAL HIGH (ref 70–99)

## 2018-05-07 SURGERY — ANTERIOR CERVICAL DECOMPRESSION/DISCECTOMY FUSION 1 LEVEL
Anesthesia: General | Site: Spine Cervical

## 2018-05-07 MED ORDER — ACETAMINOPHEN 325 MG PO TABS: 650.0000 mg | ORAL_TABLET | ORAL | Status: DC | PRN

## 2018-05-07 MED ORDER — HYDROCODONE-ACETAMINOPHEN 5-325 MG PO TABS
1.0000 | ORAL_TABLET | ORAL | Status: DC | PRN
  Administered 2018-05-07: 1 via ORAL
  Filled 2018-05-07: qty 1

## 2018-05-07 MED ORDER — OXYCODONE HCL 5 MG PO TABS
5.0000 mg | ORAL_TABLET | ORAL | Status: DC | PRN
  Filled 2018-05-07: qty 2

## 2018-05-07 MED ORDER — MIDAZOLAM HCL 2 MG/2ML IJ SOLN
INTRAMUSCULAR | Status: AC
  Filled 2018-05-07: qty 2

## 2018-05-07 MED ORDER — 0.9 % SODIUM CHLORIDE (POUR BTL) OPTIME
TOPICAL | Status: DC | PRN
  Administered 2018-05-07: 1000 mL

## 2018-05-07 MED ORDER — SODIUM CHLORIDE 0.9% FLUSH
3.0000 mL | Freq: Two times a day (BID) | INTRAVENOUS | Status: DC
  Administered 2018-05-07: 3 mL via INTRAVENOUS

## 2018-05-07 MED ORDER — CHLORHEXIDINE GLUCONATE CLOTH 2 % EX PADS: 6.0000 | MEDICATED_PAD | Freq: Once | CUTANEOUS | Status: DC

## 2018-05-07 MED ORDER — SENNOSIDES-DOCUSATE SODIUM 8.6-50 MG PO TABS: 1.0000 | ORAL_TABLET | Freq: Every evening | ORAL | Status: DC | PRN

## 2018-05-07 MED ORDER — CEFAZOLIN SODIUM-DEXTROSE 2-4 GM/100ML-% IV SOLN
2.0000 g | Freq: Three times a day (TID) | INTRAVENOUS | Status: AC
  Administered 2018-05-07 – 2018-05-08 (×2): 2 g via INTRAVENOUS
  Filled 2018-05-07 (×2): qty 100

## 2018-05-07 MED ORDER — ROCURONIUM 10MG/ML (10ML) SYRINGE FOR MEDFUSION PUMP - OPTIME
INTRAVENOUS | Status: DC | PRN
  Administered 2018-05-07: 50 mg via INTRAVENOUS
  Administered 2018-05-07: 10 mg via INTRAVENOUS

## 2018-05-07 MED ORDER — MIDAZOLAM HCL 5 MG/5ML IJ SOLN
INTRAMUSCULAR | Status: DC | PRN
  Administered 2018-05-07: 2 mg via INTRAVENOUS

## 2018-05-07 MED ORDER — ONDANSETRON HCL 4 MG/2ML IJ SOLN: 4.0000 mg | Freq: Four times a day (QID) | INTRAMUSCULAR | Status: DC | PRN

## 2018-05-07 MED ORDER — BISACODYL 10 MG RE SUPP: 10.0000 mg | Freq: Every day | RECTAL | Status: DC | PRN

## 2018-05-07 MED ORDER — ACETAMINOPHEN 500 MG PO TABS
1000.0000 mg | ORAL_TABLET | Freq: Four times a day (QID) | ORAL | Status: DC
  Administered 2018-05-07 – 2018-05-08 (×3): 1000 mg via ORAL
  Filled 2018-05-07 (×3): qty 2

## 2018-05-07 MED ORDER — LIDOCAINE-EPINEPHRINE 1 %-1:100000 IJ SOLN
INTRAMUSCULAR | Status: AC
  Filled 2018-05-07: qty 1

## 2018-05-07 MED ORDER — SUGAMMADEX SODIUM 200 MG/2ML IV SOLN
INTRAVENOUS | Status: DC | PRN
  Administered 2018-05-07: 200 mg via INTRAVENOUS

## 2018-05-07 MED ORDER — SODIUM CHLORIDE 0.9 % IV SOLN
INTRAVENOUS | Status: DC | PRN
  Administered 2018-05-07: 500 mL

## 2018-05-07 MED ORDER — FLEET ENEMA 7-19 GM/118ML RE ENEM: 1.0000 | ENEMA | Freq: Once | RECTAL | Status: DC | PRN

## 2018-05-07 MED ORDER — DEXAMETHASONE SODIUM PHOSPHATE 10 MG/ML IJ SOLN
INTRAMUSCULAR | Status: DC | PRN
  Administered 2018-05-07: 10 mg via INTRAVENOUS

## 2018-05-07 MED ORDER — SODIUM CHLORIDE 0.9 % IV SOLN: 250.0000 mL | INTRAVENOUS | Status: DC

## 2018-05-07 MED ORDER — SODIUM CHLORIDE 0.9% FLUSH: 3.0000 mL | INTRAVENOUS | Status: DC | PRN

## 2018-05-07 MED ORDER — ROCURONIUM BROMIDE 50 MG/5ML IV SOSY
PREFILLED_SYRINGE | INTRAVENOUS | Status: AC
  Filled 2018-05-07: qty 5

## 2018-05-07 MED ORDER — HYDROMORPHONE HCL 1 MG/ML IJ SOLN: 0.5000 mg | INTRAMUSCULAR | Status: DC | PRN

## 2018-05-07 MED ORDER — CEFAZOLIN SODIUM-DEXTROSE 2-4 GM/100ML-% IV SOLN
INTRAVENOUS | Status: AC
  Filled 2018-05-07: qty 100

## 2018-05-07 MED ORDER — DOCUSATE SODIUM 100 MG PO CAPS
100.0000 mg | ORAL_CAPSULE | Freq: Two times a day (BID) | ORAL | Status: DC
  Administered 2018-05-07: 100 mg via ORAL
  Filled 2018-05-07: qty 1

## 2018-05-07 MED ORDER — HYDROMORPHONE HCL 1 MG/ML IJ SOLN: 0.2500 mg | INTRAMUSCULAR | Status: DC | PRN

## 2018-05-07 MED ORDER — THROMBIN 5000 UNITS EX SOLR
OROMUCOSAL | Status: DC | PRN
  Administered 2018-05-07: 13:00:00

## 2018-05-07 MED ORDER — METHOCARBAMOL 500 MG PO TABS
500.0000 mg | ORAL_TABLET | Freq: Four times a day (QID) | ORAL | Status: DC | PRN
  Administered 2018-05-07 – 2018-05-08 (×3): 500 mg via ORAL
  Filled 2018-05-07 (×2): qty 1

## 2018-05-07 MED ORDER — SENNA 8.6 MG PO TABS
1.0000 | ORAL_TABLET | Freq: Two times a day (BID) | ORAL | Status: DC
  Administered 2018-05-07: 8.6 mg via ORAL
  Filled 2018-05-07: qty 1

## 2018-05-07 MED ORDER — PROPOFOL 10 MG/ML IV BOLUS
INTRAVENOUS | Status: DC | PRN
  Administered 2018-05-07: 120 mg via INTRAVENOUS

## 2018-05-07 MED ORDER — THROMBIN 5000 UNITS EX SOLR
CUTANEOUS | Status: AC
  Filled 2018-05-07: qty 15000

## 2018-05-07 MED ORDER — ACETAMINOPHEN 650 MG RE SUPP: 650.0000 mg | RECTAL | Status: DC | PRN

## 2018-05-07 MED ORDER — GABAPENTIN 300 MG PO CAPS
300.0000 mg | ORAL_CAPSULE | Freq: Three times a day (TID) | ORAL | Status: DC
  Administered 2018-05-07 (×2): 300 mg via ORAL
  Filled 2018-05-07 (×2): qty 1

## 2018-05-07 MED ORDER — ZOLPIDEM TARTRATE 5 MG PO TABS: 5.0000 mg | ORAL_TABLET | Freq: Every evening | ORAL | Status: DC | PRN

## 2018-05-07 MED ORDER — METHOCARBAMOL 1000 MG/10ML IJ SOLN
500.0000 mg | Freq: Four times a day (QID) | INTRAVENOUS | Status: DC | PRN
  Filled 2018-05-07: qty 5

## 2018-05-07 MED ORDER — SUFENTANIL CITRATE 50 MCG/ML IV SOLN
INTRAVENOUS | Status: AC
  Filled 2018-05-07: qty 1

## 2018-05-07 MED ORDER — METOPROLOL TARTRATE 25 MG/10 ML ORAL SUSPENSION
25.0000 mg | Freq: Once | ORAL | Status: AC
  Administered 2018-05-07: 25 mg via ORAL
  Filled 2018-05-07: qty 10

## 2018-05-07 MED ORDER — METHOCARBAMOL 500 MG PO TABS
ORAL_TABLET | ORAL | Status: AC
  Filled 2018-05-07: qty 1

## 2018-05-07 MED ORDER — LACTATED RINGERS IV SOLN
INTRAVENOUS | Status: DC
  Administered 2018-05-07 (×2): via INTRAVENOUS

## 2018-05-07 MED ORDER — SODIUM CHLORIDE 0.9 % IV SOLN: INTRAVENOUS | Status: DC

## 2018-05-07 MED ORDER — SUFENTANIL CITRATE 50 MCG/ML IV SOLN
INTRAVENOUS | Status: DC | PRN
  Administered 2018-05-07: 5 ug via INTRAVENOUS
  Administered 2018-05-07: 10 ug via INTRAVENOUS

## 2018-05-07 MED ORDER — LIDOCAINE-EPINEPHRINE 1 %-1:100000 IJ SOLN
INTRAMUSCULAR | Status: DC | PRN
  Administered 2018-05-07: 10 mL

## 2018-05-07 MED ORDER — LIDOCAINE HCL (CARDIAC) PF 100 MG/5ML IV SOSY
PREFILLED_SYRINGE | INTRAVENOUS | Status: DC | PRN
  Administered 2018-05-07: 100 mg via INTRAVENOUS

## 2018-05-07 MED ORDER — PHENOL 1.4 % MT LIQD: 1.0000 | OROMUCOSAL | Status: DC | PRN

## 2018-05-07 MED ORDER — CEFAZOLIN SODIUM-DEXTROSE 2-4 GM/100ML-% IV SOLN
2.0000 g | INTRAVENOUS | Status: AC
  Administered 2018-05-07: 2 g via INTRAVENOUS

## 2018-05-07 MED ORDER — BUPIVACAINE HCL 0.5 % IJ SOLN
INTRAMUSCULAR | Status: AC
  Filled 2018-05-07: qty 1

## 2018-05-07 MED ORDER — MENTHOL 3 MG MT LOZG: 1.0000 | LOZENGE | OROMUCOSAL | Status: DC | PRN

## 2018-05-07 MED ORDER — HYDROCODONE-ACETAMINOPHEN 5-325 MG PO TABS
ORAL_TABLET | ORAL | Status: AC
  Filled 2018-05-07: qty 1

## 2018-05-07 MED ORDER — SODIUM CHLORIDE 0.9 % IV SOLN
INTRAVENOUS | Status: DC | PRN
  Administered 2018-05-07: 50 ug/min via INTRAVENOUS

## 2018-05-07 MED ORDER — ONDANSETRON HCL 4 MG/2ML IJ SOLN
INTRAMUSCULAR | Status: DC | PRN
  Administered 2018-05-07: 4 mg via INTRAVENOUS

## 2018-05-07 MED ORDER — LIDOCAINE 2% (20 MG/ML) 5 ML SYRINGE
INTRAMUSCULAR | Status: AC
  Filled 2018-05-07: qty 5

## 2018-05-07 MED ORDER — ONDANSETRON HCL 4 MG PO TABS: 4.0000 mg | ORAL_TABLET | Freq: Four times a day (QID) | ORAL | Status: DC | PRN

## 2018-05-07 MED ORDER — BUPIVACAINE HCL 0.5 % IJ SOLN
INTRAMUSCULAR | Status: DC | PRN
  Administered 2018-05-07: 10 mL

## 2018-05-07 MED ORDER — PROPOFOL 10 MG/ML IV BOLUS
INTRAVENOUS | Status: AC
  Filled 2018-05-07: qty 40

## 2018-05-07 SURGICAL SUPPLY — 58 items
BAG DECANTER FOR FLEXI CONT (MISCELLANEOUS) ×2 IMPLANT
BENZOIN TINCTURE PRP APPL 2/3 (GAUZE/BANDAGES/DRESSINGS) IMPLANT
BLADE CLIPPER SURG (BLADE) IMPLANT
BLADE SURG 11 STRL SS (BLADE) ×2 IMPLANT
BLADE ULTRA TIP 2M (BLADE) IMPLANT
BUR MATCHSTICK NEURO 3.0 LAGG (BURR) ×2 IMPLANT
CAGE PEEK TI 6X16X14 12D (Cage) ×2 IMPLANT
CANISTER SUCT 3000ML PPV (MISCELLANEOUS) ×2 IMPLANT
CARTRIDGE OIL MAESTRO DRILL (MISCELLANEOUS) ×1 IMPLANT
COVER WAND RF STERILE (DRAPES) ×2 IMPLANT
DECANTER SPIKE VIAL GLASS SM (MISCELLANEOUS) ×2 IMPLANT
DERMABOND ADVANCED (GAUZE/BANDAGES/DRESSINGS) ×1
DERMABOND ADVANCED .7 DNX12 (GAUZE/BANDAGES/DRESSINGS) ×1 IMPLANT
DIFFUSER DRILL AIR PNEUMATIC (MISCELLANEOUS) ×2 IMPLANT
DRAPE C-ARM 42X72 X-RAY (DRAPES) ×4 IMPLANT
DRAPE HALF SHEET 40X57 (DRAPES) IMPLANT
DRAPE LAPAROTOMY 100X72 PEDS (DRAPES) ×2 IMPLANT
DRAPE MICROSCOPE LEICA (MISCELLANEOUS) IMPLANT
DRSG OPSITE 4X5.5 SM (GAUZE/BANDAGES/DRESSINGS) ×4 IMPLANT
DRSG OPSITE POSTOP 3X4 (GAUZE/BANDAGES/DRESSINGS) ×2 IMPLANT
DURAPREP 6ML APPLICATOR 50/CS (WOUND CARE) ×2 IMPLANT
ELECT COATED BLADE 2.86 ST (ELECTRODE) ×2 IMPLANT
ELECT REM PT RETURN 9FT ADLT (ELECTROSURGICAL) ×2
ELECTRODE REM PT RTRN 9FT ADLT (ELECTROSURGICAL) ×1 IMPLANT
GAUZE 4X4 16PLY RFD (DISPOSABLE) IMPLANT
GLOVE BIO SURGEON STRL SZ7.5 (GLOVE) IMPLANT
GLOVE BIOGEL PI IND STRL 7.5 (GLOVE) ×2 IMPLANT
GLOVE BIOGEL PI INDICATOR 7.5 (GLOVE) ×2
GLOVE ECLIPSE 7.0 STRL STRAW (GLOVE) ×4 IMPLANT
GOWN STRL REUS W/ TWL LRG LVL3 (GOWN DISPOSABLE) ×3 IMPLANT
GOWN STRL REUS W/ TWL XL LVL3 (GOWN DISPOSABLE) IMPLANT
GOWN STRL REUS W/TWL 2XL LVL3 (GOWN DISPOSABLE) ×2 IMPLANT
GOWN STRL REUS W/TWL LRG LVL3 (GOWN DISPOSABLE) ×3
GOWN STRL REUS W/TWL XL LVL3 (GOWN DISPOSABLE)
HEMOSTAT POWDER KIT SURGIFOAM (HEMOSTASIS) ×2 IMPLANT
INSERT GRAFT XPANSE 9.5X8.5X6 (Bone Implant) ×2 IMPLANT
KIT BASIN OR (CUSTOM PROCEDURE TRAY) ×2 IMPLANT
KIT TURNOVER KIT B (KITS) ×2 IMPLANT
NEEDLE HYPO 22GX1.5 SAFETY (NEEDLE) ×2 IMPLANT
NEEDLE SPNL 22GX3.5 QUINCKE BK (NEEDLE) ×2 IMPLANT
NS IRRIG 1000ML POUR BTL (IV SOLUTION) ×2 IMPLANT
OIL CARTRIDGE MAESTRO DRILL (MISCELLANEOUS) ×2
PACK LAMINECTOMY NEURO (CUSTOM PROCEDURE TRAY) ×2 IMPLANT
PAD ARMBOARD 7.5X6 YLW CONV (MISCELLANEOUS) ×6 IMPLANT
PLATE ZEVO 1LVL 17MM (Plate) ×2 IMPLANT
RUBBERBAND STERILE (MISCELLANEOUS) ×4 IMPLANT
SCREW 3.5 SELFDRILL 15MM VARI (Screw) ×8 IMPLANT
SPONGE INTESTINAL PEANUT (DISPOSABLE) ×2 IMPLANT
SPONGE SURGIFOAM ABS GEL SZ50 (HEMOSTASIS) ×2 IMPLANT
STAPLER VISISTAT 35W (STAPLE) ×2 IMPLANT
STRIP CLOSURE SKIN 1/2X4 (GAUZE/BANDAGES/DRESSINGS) IMPLANT
SUT VIC AB 3-0 SH 8-18 (SUTURE) ×2 IMPLANT
SUT VICRYL 3-0 RB1 18 ABS (SUTURE) ×4 IMPLANT
TAPE CLOTH 3X10 TAN LF (GAUZE/BANDAGES/DRESSINGS) ×2 IMPLANT
TIP KERRISON THIN FOOTPLATE 2M (MISCELLANEOUS) ×2 IMPLANT
TOWEL GREEN STERILE (TOWEL DISPOSABLE) ×2 IMPLANT
TOWEL GREEN STERILE FF (TOWEL DISPOSABLE) ×2 IMPLANT
WATER STERILE IRR 1000ML POUR (IV SOLUTION) ×2 IMPLANT

## 2018-05-07 NOTE — Evaluation (Signed)
Physical Therapy Evaluation Patient Details Name: Jorge Adams MRN: [id number] DOB: [birth year] Today's Date: 05/07/2018   History of Present Illness  Pt is a 67 y/o male s/p C5-6 ACDF. PMH includes DM, HTN, MI, and CAD s/p CABG.   Clinical Impression  Patient is s/p above surgery resulting in the deficits listed below (see PT Problem List). Pt guarded and unsteady during session, requiring min to min guard A for mobility. Educated about cervical precautions, DME recommendations, and generalized walking program.  Patient will benefit from skilled PT to increase their independence and safety with mobility (while adhering to their precautions) to allow discharge to the venue listed below.     Follow Up Recommendations No PT follow up    Equipment Recommendations  None recommended by PT    Recommendations for Other Services       Precautions / Restrictions Precautions Precautions: Cervical Precaution Booklet Issued: Yes (comment) Precaution Comments: Reviewed cervical precautions with pt.  Required Braces or Orthoses: Cervical Brace Restrictions Weight Bearing Restrictions: No      Mobility  Bed Mobility Overal bed mobility: Needs Assistance Bed Mobility: Rolling;Sidelying to Sit;Sit to Sidelying Rolling: Supervision Sidelying to sit: Supervision     Sit to sidelying: Supervision General bed mobility comments: Supervision for safety. Increased time required and cues for log roll technique.   Transfers Overall transfer level: Needs assistance Equipment used: None Transfers: Sit to/from Stand Sit to Stand: Min assist         General transfer comment: Min A for steadying assist.   Ambulation/Gait Ambulation/Gait assistance: Min guard Gait Distance (Feet): 300 Feet Assistive device: IV Pole Gait Pattern/deviations: Step-through pattern;Decreased stride length;Wide base of support Gait velocity: Decreased    General Gait Details: Slow, cautious gait. Reaching  out for rails in the hall way for steadying. Unsteadiness noted, however, no overt LOB. Educated about use of AD at home.   Stairs            Wheelchair Mobility    Modified Rankin (Stroke Patients Only)       Balance Overall balance assessment: Needs assistance Sitting-balance support: No upper extremity supported;Feet supported Sitting balance-Leahy Scale: Good     Standing balance support: Bilateral upper extremity supported;Single extremity supported;During functional activity Standing balance-Leahy Scale: Fair Standing balance comment: Able to maintain static standing without UE support                              Pertinent Vitals/Pain Pain Assessment: Faces Faces Pain Scale: Hurts a little bit Pain Location: neck  Pain Descriptors / Indicators: Aching;Operative site guarding Pain Intervention(s): Limited activity within patient's tolerance;Monitored during session;Repositioned    Home Living Family/patient expects to be discharged to:: Private residence Living Arrangements: Spouse/significant other Available Help at Discharge: Available 24 hours/day;Family Type of Home: House Home Access: Stairs to enter Entrance Stairs-Rails: Right;Left;Can reach both Entrance Stairs-Number of Steps: 3-4 Home Layout: One level Home Equipment: Walker - 2 wheels;Cane - single point;Bedside commode      Prior Function Level of Independence: Independent               Hand Dominance        Extremity/Trunk Assessment   Upper Extremity Assessment Upper Extremity Assessment: Defer to OT evaluation    Lower Extremity Assessment Lower Extremity Assessment: Generalized weakness    Cervical / Trunk Assessment Cervical / Trunk Assessment: Other exceptions Cervical / Trunk Exceptions: s/p ACDF  Communication   Communication: No difficulties  Cognition Arousal/Alertness: Awake/alert Behavior During Therapy: WFL for tasks assessed/performed Overall  Cognitive Status: Within Functional Limits for tasks assessed                                        General Comments General comments (skin integrity, edema, etc.): Pt's wife present during session. Educated about generalized walking program to perform at home.     Exercises     Assessment/Plan    PT Assessment Patient needs continued PT services  PT Problem List Decreased strength;Decreased balance;Decreased mobility;Decreased knowledge of precautions;Pain       PT Treatment Interventions DME instruction;Gait training;Stair training;Functional mobility training;Therapeutic exercise;Therapeutic activities;Balance training;Patient/family education    PT Goals (Current goals can be found in the Care Plan section)  Acute Rehab PT Goals Patient Stated Goal: to go home  PT Goal Formulation: With patient Time For Goal Achievement: 05/21/18 Potential to Achieve Goals: Good    Frequency Min 5X/week   Barriers to discharge        Co-evaluation               AM-PAC PT "6 Clicks" Daily Activity  Outcome Measure Difficulty turning over in bed (including adjusting bedclothes, sheets and blankets)?: A Little Difficulty moving from lying on back to sitting on the side of the bed? : A Little Difficulty sitting down on and standing up from a chair with arms (e.g., wheelchair, bedside commode, etc,.)?: Unable Help needed moving to and from a bed to chair (including a wheelchair)?: A Little Help needed walking in hospital room?: A Little Help needed climbing 3-5 steps with a railing? : A Lot 6 Click Score: 15    End of Session Equipment Utilized During Treatment: Gait belt Activity Tolerance: Patient tolerated treatment well Patient left: in bed;with call bell/phone within reach;with family/visitor present Nurse Communication: Mobility status PT Visit Diagnosis: Unsteadiness on feet (R26.81);Other abnormalities of gait and mobility (R26.89);Pain Pain - part of  body: (neck )    Time: 4680-3212 PT Time Calculation (min) (ACUTE ONLY): 15 min   Charges:   PT Evaluation $PT Eval Low Complexity: [place], PT, DPT  Acute Rehabilitation Services  Pager: [phone] Office: [phone]   Rudean Hitt 05/07/2018, 5:57 PM

## 2018-05-07 NOTE — Anesthesia Postprocedure Evaluation (Signed)
Anesthesia Post Note  Patient: Jorge Adams  Procedure(s) Performed: ANTERIOR CERVICAL DECOMPRESSION/DISCECTOMY FUSION CERVICAL FIVE - CERVICAL SIX (N/A Spine Cervical)     Patient location during evaluation: PACU Anesthesia Type: General Level of consciousness: awake Pain management: pain level controlled Respiratory status: spontaneous breathing Cardiovascular status: stable Postop Assessment: no apparent nausea or vomiting Anesthetic complications: no    Last Vitals:  Vitals:   05/07/18 1422 05/07/18 1500  BP: (!) 181/81 (!) 172/93  Pulse: (!) 59 68  Resp:  16  Temp:    SpO2: 95% 95%    Last Pain:  Vitals:   05/07/18 1422  TempSrc:   PainSc: Asleep                 Azelyn Batie

## 2018-05-07 NOTE — Anesthesia Preprocedure Evaluation (Signed)
Anesthesia Evaluation  Patient identified by MRN, date of birth, ID band Patient awake    Airway Mallampati: II  TM Distance: >3 FB     Dental   Pulmonary former smoker,    breath sounds clear to auscultation       Cardiovascular hypertension, + CAD and + Past MI   Rhythm:Regular Rate:Normal     Neuro/Psych    GI/Hepatic negative GI ROS, Neg liver ROS,   Endo/Other  diabetes  Renal/GU      Musculoskeletal   Abdominal   Peds  Hematology   Anesthesia Other Findings   Reproductive/Obstetrics                             Anesthesia Physical Anesthesia Plan  ASA: III  Anesthesia Plan: General   Post-op Pain Management:    Induction: Intravenous  PONV Risk Score and Plan:   Airway Management Planned: Oral ETT  Additional Equipment:   Intra-op Plan:   Post-operative Plan: Possible Post-op intubation/ventilation  Informed Consent: I have reviewed the patients History and Physical, chart, labs and discussed the procedure including the risks, benefits and alternatives for the proposed anesthesia with the patient or authorized representative who has indicated his/her understanding and acceptance.   Dental advisory given  Plan Discussed with: CRNA and Anesthesiologist  Anesthesia Plan Comments:         Anesthesia Quick Evaluation

## 2018-05-07 NOTE — Anesthesia Procedure Notes (Signed)
Procedure Name: Intubation Date/Time: 05/07/2018 12:11 PM Performed by: Claris Che, CRNA Pre-anesthesia Checklist: Patient identified, Emergency Drugs available, Suction available, Patient being monitored and Timeout performed Patient Re-evaluated:Patient Re-evaluated prior to induction Oxygen Delivery Method: Circle system utilized Preoxygenation: Pre-oxygenation with 100% oxygen Induction Type: IV induction Ventilation: Mask ventilation without difficulty Tube size: 7.5 mm Number of attempts: 1 Airway Equipment and Method: Stylet Placement Confirmation: ETT inserted through vocal cords under direct vision,  positive ETCO2 and breath sounds checked- equal and bilateral Secured at: 24 cm Tube secured with: Tape Dental Injury: Teeth and Oropharynx as per pre-operative assessment

## 2018-05-07 NOTE — Transfer of Care (Signed)
Immediate Anesthesia Transfer of Care Note  Patient: Jorge Adams  Procedure(s) Performed: ANTERIOR CERVICAL DECOMPRESSION/DISCECTOMY FUSION CERVICAL FIVE - CERVICAL SIX (N/A Spine Cervical)  Patient Location: PACU  Anesthesia Type:General  Level of Consciousness: awake, alert  and oriented  Airway & Oxygen Therapy: Patient connected to face mask oxygen  Post-op Assessment: Post -op Vital signs reviewed and stable  Post vital signs: stable  Last Vitals:  Vitals Value Taken Time  BP    Temp    Pulse 67 05/07/2018  2:07 PM  Resp 16 05/07/2018  2:07 PM  SpO2 98 % 05/07/2018  2:07 PM  Vitals shown include unvalidated device data.  Last Pain:  Vitals:   05/07/18 1100  TempSrc:   PainSc: 0-No pain         Complications: No apparent anesthesia complications

## 2018-05-07 NOTE — H&P (Signed)
Chief Complaint   Hand numbness, gait instability   HPI   HPI: Jorge Adams is a 67 y.o. male with a primary complaint of bilateral hand numbness and tingling, and progressive imbalance.  He says his symptoms started several months ago when he suffered a relatively minor fall.  Since then he has noted progressive worsening in balance.  He says he feels like his legs and feet moves very slowly.  His wife has noted that he can barely walk up stairs without the use of a handrail.  He does also report numbness involving both hands, worse when he wakes up at night.  He has noted some mild incoordination with both his hands, although he is still able to maintain his handwriting, and other finer movements.  He has not noted any changes in bowel or bladder function.  He has noted subjective weakness in both his arms.  Of note, he does not really report any neck or arm pain. A cervical spine MRI was ordered and shows cervical stenosis at C5-6. It was rec he undergo surgical intervention. He presents today for surgery and is without any concerns.   Patient Active Problem List   Diagnosis Date Noted  . Paresthesia 03/15/2018  . Health care maintenance 09/04/2017  . Snoring 03/05/2016  . Umbilical hernia 32/35/5732  . CAD (coronary artery disease) 01/02/2016  . Angina pectoris ([place]) 12/18/2015  . Routine general medical examination at a health care facility 12/07/2014  . Advance care planning 12/07/2014  . Atherosclerosis of native arteries of the extremities with intermittent claudication 12/05/2013  . Erectile dysfunction associated with type 2 diabetes mellitus ([place]) 10/31/2013  . Type 2 diabetes mellitus with vascular disease ([place]) 10/31/2013  . Senile nuclear sclerosis 06/04/2013  . Borderline glaucoma with ocular hypertension 06/04/2013  . Amblyopia, unspecified 06/04/2013  . ASHD (arteriosclerotic heart disease) 01/28/2011  . Hypogonadism, male 01/28/2011  . Hypertension associated  with diabetes ([place]) 01/28/2011  . Hyperlipidemia LDL goal <70 01/28/2011  . Obesity (BMI 30-39.9) 01/28/2011    PMH: Past Medical History:  Diagnosis Date  . Arthritis   . ASHD (arteriosclerotic heart disease)   . Atherosclerosis of native arteries of the extremities with intermittent claudication   . Cataract   . Coronary atherosclerosis of native coronary artery   . Diabetes mellitus    NIDDM  . Dyslipidemia   . ED (erectile dysfunction)   . Full dentures   . Glaucoma associated with ocular disorder, mild stage    bilateral  . Hypertension    under control, has been on med. > 9 yr.  . Impotence of organic origin   . Meniscus tear 05/2012   right knee  . Myocardial infarction ([place]) 10/2002  . Stented coronary artery     PSH: Past Surgical History:  Procedure Laterality Date  . CARDIAC CATHETERIZATION  10/23/2002   had 1 stent placed then-- by Dr. Melvern Banker  . CARDIAC CATHETERIZATION N/A 12/21/2015   Procedure: Left Heart Cath and Coronary Angiography;  Surgeon: Adrian Prows, MD;  Location: [facility];  Service: Cardiovascular;  Laterality: N/A;  . CATARACT EXTRACTION Right 05/2017  . CATARACT EXTRACTION Left 07/2017  . COLONOSCOPY    . CORONARY ARTERY BYPASS GRAFT N/A 01/02/2016   Procedure: CORONARY ARTERY BYPASS GRAFTING (CABG) TIMES 3 USING LEFT INTERNAL MAMMARY ARTERY AND RIGHT SAPHENOUS LEG VEIN HARVESTED ENDOSCOPICALLY;  Surgeon: Ivin Poot, MD;  Location: [facility];  Service: Open Heart Surgery;  Laterality: N/A;  . INSERTION  OF MESH N/A 05/06/2016   Procedure: INSERTION OF MESH;  Surgeon: Georganna Skeans, MD;  Location: [facility];  Service: General;  Laterality: N/A;  . KNEE ARTHROSCOPY WITH MEDIAL MENISECTOMY  06/04/2012   Procedure: KNEE ARTHROSCOPY WITH MEDIAL MENISECTOMY;  Surgeon: Ninetta Lights, MD;  Location: [facility];  Service: Orthopedics;  Laterality: Right;  RIGHT SCOPE MEDIAL MENISCECTOMY, CHONDROPLASTY, EXCISION LOOSE BODY  . LOWER  EXTREMITY ANGIOGRAM N/A 11/23/2013   Procedure: LOWER EXTREMITY ANGIOGRAM;  Surgeon: Laverda Page, MD;  Location: [facility];  Service: Cardiovascular;  Laterality: N/A;  . PERIPHERAL VASCULAR CATHETERIZATION N/A 12/21/2015   Procedure: Abdominal Aortogram;  Surgeon: Adrian Prows, MD;  Location: [facility];  Service: Cardiovascular;  Laterality: N/A;  . PERIPHERAL VASCULAR CATHETERIZATION N/A 12/21/2015   Procedure: Abdominal Aortogram w/Lower Extremity;  Surgeon: Adrian Prows, MD;  Location: [facility];  Service: Cardiovascular;  Laterality: N/A;  . TEE WITHOUT CARDIOVERSION N/A 01/02/2016   Procedure: TRANSESOPHAGEAL ECHOCARDIOGRAM (TEE);  Surgeon: Ivin Poot, MD;  Location: [facility];  Service: Open Heart Surgery;  Laterality: N/A;  . UMBILICAL HERNIA REPAIR N/A 05/06/2016   Procedure: HERNIA REPAIR UMBILICAL ADULT;  Surgeon: Georganna Skeans, MD;  Location: [facility];  Service: General;  Laterality: N/A;    No medications prior to admission.    SH: Social History   Tobacco Use  . Smoking status: Former Smoker    Last attempt to quit: 12/02/2002    Years since quitting: 15.4  . Smokeless tobacco: Never Used  Substance Use Topics  . Alcohol use: Yes    Comment: rare  . Drug use: No    MEDS: Prior to Admission medications   Medication Sig Start Date End Date Taking? Authorizing Provider  aspirin EC 81 MG tablet Take 81 mg by mouth daily.   Yes [provider]  brimonidine (ALPHAGAN P) 0.1 % SOLN Place 1 drop into both eyes 2 (two) times daily.   Yes [provider]  cilostazol (PLETAL) 100 MG tablet Take 1 tablet (100 mg total) by mouth 2 (two) times daily. 09/02/17  Yes [name], MD  dorzolamide-timolol (COSOPT) 22.3-6.8 MG/ML ophthalmic solution Place 1 drop into both eyes 2 (two) times daily.   Yes [provider]  fenofibrate (TRICOR) 145 MG tablet Take 145 mg by mouth daily.   Yes [provider]  metFORMIN (GLUCOPHAGE) 1000 MG  tablet Take 1 tablet (1,000 mg total) by mouth 2 (two) times daily. 09/02/17  Yes [name], MD  metoprolol tartrate (LOPRESSOR) 25 MG tablet Take 25 mg by mouth 2 (two) times daily.   Yes [provider]  nitroGLYCERIN (NITROSTAT) 0.4 MG SL tablet Place 0.4 mg under the tongue every 5 (five) minutes as needed for chest pain.  12/13/15  Yes [provider]  omega-3 acid ethyl esters (LOVAZA) 1 g capsule Take 1 g by mouth 2 (two) times daily.  10/20/16  Yes [provider]  rosuvastatin (CRESTOR) 20 MG tablet Take 20 mg by mouth daily.    Yes [provider]  TRAVATAN Z 0.004 % SOLN ophthalmic solution Place 1 drop into both eyes at bedtime. 12/03/15  Yes [provider]  amLODipine-benazepril (LOTREL) 5-20 MG capsule Take 1 capsule by mouth daily.    [provider]  Jonetta Speak LANCETS 58K MISC USE DAILY AS NEEDED 04/21/17   [name], MD  [facility] VERIO test strip USE DAILY AS NEEDED 11/14/16  [name], MD  phenylephrine (NEO-SYNEPHRINE) 1 % nasal spray Place 1 drop into both nostrils at bedtime as needed for congestion.    [provider]  sodium chloride (OCEAN) 0.65 % SOLN nasal spray Place 1 spray into both nostrils as needed for congestion.    [provider]    ALLERGY: No Known Allergies  Social History   Tobacco Use  . Smoking status: Former Smoker    Last attempt to quit: 12/02/2002    Years since quitting: 15.4  . Smokeless tobacco: Never Used  Substance Use Topics  . Alcohol use: Yes    Comment: rare     Family History  Problem Relation Age of Onset  . Bone cancer Mother   . Pneumonia Father   . Diabetes Brother   . Heart disease Brother   . COPD Brother   . Colon cancer Neg Hx   . Prostate cancer Neg Hx      ROS   ROS  Exam   There were no vitals filed for this visit. General appearance: WDWN, NAD Eyes: No scleral injection Cardiovascular: Regular rate and rhythm  without murmurs, rubs, gallops. No edema or variciosities. Distal pulses normal. Pulmonary: Effort normal, non-labored breathing Musculoskeletal:     Muscle tone upper extremities: Normal    Muscle tone lower extremities: Normal    Motor exam: Upper Extremities Deltoid Bicep Tricep Grip  Right 5/5 5/5 4/5 5/5  Left 5/5 5/5 4/5 5/5   Lower Extremity IP Quad PF DF EHL  Right 5/5 5/5 5/5 5/5 5/5  Left 5/5 5/5 5/5 5/5 5/5   Neurological Mental Status:    - Patient is awake, alert, oriented to person, place, month, year, and situation    - Patient is able to give a clear and coherent history.    - No signs of aphasia or neglect Cranial Nerves    - II: Visual Fields are full. Pupils are equal, round, and reactive to light.     - III/IV/VI: EOMI without ptosis or diploplia.     - V: Facial sensation is grossly normal    - VII: Facial movement is symmetric.     - VIII: hearing is intact to voice    - X: Uvula elevates symmetrically    - XI: Shoulder shrug is symmetric.    - XII: tongue is midline without atrophy or fasciculations.  Sensory: Sensation grossly intact to LT Deep Tendon Reflexes    - 2+ and symmetric in the biceps and patellae. 3+ bracio, + hoffmans bilaterally   Results - Imaging/Labs   No results found for this or any previous visit (from the past 48 hour(s)).  No results found.  IMAGING: MRI of the cervical spine dated 04/15/2018 was reviewed.  This demonstrates straightening of normal cervical lordosis.  primary finding is at C5-6 where there is broad-based right eccentric disc osteophyte complex with resultant severe central stenosis.  There is associated T2 signal change within the cord at this level.  Impression/Plan   67 y.o. male with signs and symptoms as well as radiographic findings consistent with cervical stenosis with myelopathy at C5-6.  The risks of surgery were discussed in detail with the patient which include but are not limited to spinal cord  injury which may result in hand, leg, and bowel dysfunction, postoperative dysphagia, dysphonia, neck hematoma, or subsequent surgery for epidural hematoma.  The risk of CSF leak was also discussed. In addition, I explained to him that after spinal  fusion surgery, there is a risk of adjacent level disease requiring future surgical intervention. The possibility of continued/worsening pain after surgery was also discussed. The general risks of anesthesia were also reviewed including heart attack, stroke, and DVT/PE.    The patient and his wife understood our discussion as well as the risks of the surgery and are willing to proceed.  All questions were answered.

## 2018-05-07 NOTE — Op Note (Signed)
NEUROSURGERY OPERATIVE NOTE   PREOP DIAGNOSIS: Cervical stenosis with myelopathy, C5-6  POSTOP DIAGNOSIS: Same  PROCEDURE: 1. Discectomy at C5-6 for decompression of spinal cord and exiting nerve roots  2. Placement of intervertebral biomechanical device - Medtronic 28m lordotic PTC PEEK  3. Placement of anterior instrumentation consisting of interbody plate and screws - Medtronic Zevo anterior plate  4. Use of morselized bone allograft  5. Arthrodesis C5-6, anterior interbody technique  6. Use of intraoperative microscope  SURGEON: Dr. NConsuella Lose MD  ASSISTANT: VFerne Reus PA-C  ANESTHESIA: General Endotracheal  EBL: 100cc  SPECIMENS: None  DRAINS: None  COMPLICATIONS: None immediate  CONDITION: Hemodynamically stable to PACU  HISTORY: Jorge HARKis a 67y.o. y.o. male who initially presented to the outpatient clinic with signs and symptoms consistent with cervical myelopathy, including gait instability, some incoordination, and numbness and tingling in the right greater than left hand. MRI demonstrated significant central and lateral recess/foraminal stenosis at C5-6 with intrinsic T2 signal change within the spinal cord at this level. Treatment options were discussed and the patient elected to proceed with anterior cervical decompression and fusion at C5-6. After all questions were answered, informed consent was obtained.  PROCEDURE IN DETAIL: The patient was brought to the operating room and transferred to the operative table. After induction of general anesthesia, the patient was positioned on the operative table in the supine position with all pressure points meticulously padded. The skin of the neck was then prepped and draped in the usual sterile fashion.  After timeout was conducted, the skin was infiltrated with local anesthetic. Skin incision was then made sharply and Bovie electrocautery was used to dissect the subcutaneous tissue until the  platysma was identified. The platysma was then divided and undermined. The sternocleidomastoid muscle was then identified and, utilizing natural fascial planes in the neck, the prevertebral fascia was identified and the carotid sheath was retracted laterally and the trachea and esophagus retracted medially. Again using fluoroscopy, the correct disc space was identified. Bovie electrocautery was used to dissect in the subperiosteal plane and elevate the bilateral longus coli muscles. Self-retaining retractors were then placed. At this point, the microscope was draped and brought into the field, and the remainder of the case was done under the microscope using microdissecting technique.  The disc space was incised sharply and rongeurs were use to initially complete a discectomy. The high-speed drill was then used to complete discectomy until the posterior annulus was identified and removed and the posterior longitudinal ligament was identified. Using a nerve hook, the PLL was elevated.  Of note, there was a large posterior osteophyte which was adherent to the posterior longitudinal ligament which was removed with the PLL.  The ventral thecal sac was identified. Using a combination of curettes and rongeurs, complete decompression of the thecal sac and exiting nerve roots at this level was completed, and verified using micro-nerve hook.  I was able to visualize bilateral C6 nerve roots in the medial portion of the foramen indicating good decompression.  Having completed our decompression, attention was turned to placement of the intervertebral device. Trial spacers were used to select a 6 mm lordotic graft. This graft was then filled with morcellized allograft, and inserted under live fluoroscopy.  After placement of the intervertebral device, the above anterior cervical plate was selected, and placed across the interspace. Using a high-speed drill, the cortex of the cervical vertebral bodies was punctured, and  screws inserted in the level above and below. Final fluoroscopic  images in AP and lateral projections were taken to confirm good hardware placement.  At this point, after all counts were verified to be correct, meticulous hemostasis was secured using a combination of bipolar electrocautery and passive hemostatics. The platysma muscle was then closed using interrupted 3-0 Vicryl sutures, and the skin was closed with an interrupted 3-0 Vicry subcuticular stitch. Dermabond and sterile dressings were then applied and the drapes removed.  The patient tolerated the procedure well and was extubated in the room and taken to the postanesthesia care unit in stable condition.  At the end of the case all sponge, needle, instrument, and cottonoid counts were correct.

## 2018-05-08 ENCOUNTER — Encounter (HOSPITAL_COMMUNITY): Payer: Self-pay | Admitting: Neurosurgery

## 2018-05-08 DIAGNOSIS — R200 Anesthesia of skin: Secondary | ICD-10-CM | POA: Diagnosis not present

## 2018-05-08 DIAGNOSIS — M4802 Spinal stenosis, cervical region: Secondary | ICD-10-CM | POA: Diagnosis not present

## 2018-05-08 LAB — BASIC METABOLIC PANEL
Anion gap: 12 (ref 5–15)
BUN: 7 mg/dL — ABNORMAL LOW (ref 8–23)
CO2: 24 mmol/L (ref 22–32)
Calcium: 9.4 mg/dL (ref 8.9–10.3)
Chloride: 100 mmol/L (ref 98–111)
Creatinine, Ser: 0.9 mg/dL (ref 0.61–1.24)
GFR calc Af Amer: >60 mL/min (ref >60)
GFR calc non Af Amer: >60 mL/min (ref >60)
Glucose, Bld: 171 mg/dL — ABNORMAL HIGH (ref 70–99)
Potassium: 3.9 mmol/L (ref 3.5–5.1)
Sodium: 136 mmol/L (ref 135–145)

## 2018-05-08 LAB — PROTIME-INR
INR: 1.08
Prothrombin Time: 13.9 seconds (ref 11.4–15.2)

## 2018-05-08 LAB — GLUCOSE, CAPILLARY: Glucose-Capillary: 160 mg/dL — ABNORMAL HIGH (ref 70–99)

## 2018-05-08 LAB — CBC
HCT: 42.5 % (ref 39.0–52.0)
Hemoglobin: 14.2 g/dL (ref 13.0–17.0)
MCH: 28.2 pg (ref 26.0–34.0)
MCHC: 33.4 g/dL (ref 30.0–36.0)
MCV: 84.5 fL (ref 80.0–100.0)
Platelets: 166 10*3/uL (ref 150–400)
RBC: 5.03 MIL/uL (ref 4.22–5.81)
RDW: 14.1 % (ref 11.5–15.5)
WBC: 10.8 10*3/uL — ABNORMAL HIGH (ref 4.0–10.5)
nRBC: 0 % (ref 0.0–0.2)

## 2018-05-08 LAB — APTT: aPTT: 30 s (ref 24–36)

## 2018-05-08 MED ORDER — METHOCARBAMOL 750 MG PO TABS: 750.0000 mg | ORAL_TABLET | Freq: Three times a day (TID) | ORAL | 1 refills | Status: DC | PRN

## 2018-05-08 MED ORDER — OXYCODONE-ACETAMINOPHEN 7.5-325 MG PO TABS: 1.0000 | ORAL_TABLET | ORAL | 0 refills | Status: DC | PRN

## 2018-05-08 MED ORDER — ASPIRIN EC 81 MG PO TBEC: 81.0000 mg | DELAYED_RELEASE_TABLET | Freq: Every day | ORAL | Status: DC

## 2018-05-08 NOTE — Progress Notes (Signed)
  NEUROSURGERY PROGRESS NOTE   No issues overnight.  Mild dysphagia resolved. Tolerating normal diet Subjective improvement in gait and numbness Voiding normal  EXAM:  BP (!) 141/96 (BP Location: Right Arm)   Pulse 77   Temp 97.7 F (36.5 C) (Oral)   Resp 20   Ht 5' 10.5" (1.791 m)   Wt 92 kg   SpO2 96%   BMI 28.69 kg/m   Awake, alert, oriented  Speech fluent, appropriate  CN grossly intact  5/5 BUE/BLE  Incision c/d/i  PLAN Doing well this am Cleared for d/c

## 2018-05-08 NOTE — Progress Notes (Signed)
Patient is discharged from room 3C07 at this time. Alert and in stable condition. IV site d/c'd and instructions read to patient and spouse with understanding verbalized. Left unit via wheelchair with all belongings at side. 

## 2018-05-08 NOTE — Progress Notes (Signed)
8:21 Received UR notification for Code 44, went to room at 8:45 am and pt had dc home.  Jonnie Finner RN CCM Case Mgmt phone [phone]

## 2018-05-08 NOTE — Discharge Instructions (Signed)
Wound Care remove dressing in 3-5 days Leave incision open to air. You may shower. Do not scrub directly on incision.  Do not put any creams, lotions, or ointments on incision. Activity Walk each and every day, increasing distance each day. No lifting greater than 5 - 10lbs.  Avoid excessive neck motion. No driving for 2 weeks; may ride as a passenger locally. Wear neck brace at all times except when showering.  If provided soft collar, may wear for comfort unless otherwise instructed. Diet Resume your normal diet.  Return to Work Will be discussed at you follow up appointment. Call Your Doctor If Any of These Occur Redness, drainage, or swelling at the wound.  Temperature greater than 101 degrees. Severe pain not relieved by pain medication. Increased difficulty swallowing. Incision starts to come apart. Follow Up Appt Call today for appointment in 2-4 weeks ([phone]) or for problems.  If you have any hardware placed in your spine, you will need an x-ray before your appointment.

## 2018-05-08 NOTE — Discharge Summary (Signed)
Physician Discharge Summary  Patient ID: Jorge Adams MRN: [id number] DOB/AGE: 1950-08-12 67 y.o.  Admit date: 05/07/2018 Discharge date: 05/08/2018  Admission Diagnoses:  Cervical stenosis with myelopathy  Discharge Diagnoses:  Same Active Problems:   Stenosis of cervical spine with myelopathy [facility])   Discharged Condition: Stable  Hospital Course:  Jorge Adams is a 67 y.o. male who was admitted for the below procedure. There were no post operative complications. At time of discharge, pain was well controlled, ambulating with Pt/OT, tolerating po, voiding normal. Ready for discharge.  Treatments: Surgery - C5-6 ACDF  Discharge Exam: Blood pressure (!) 141/96, pulse 77, temperature 97.7 F (36.5 C), temperature source Oral, resp. rate 20, height 5' 10.5" (1.791 m), weight 92 kg, SpO2 96 %. Awake, alert, oriented Speech fluent, appropriate CN grossly intact 5/5 BUE/BLE Wound c/d/i  Disposition: Discharge disposition: 01-Home or Self Care       Discharge Instructions    Call MD for:  difficulty breathing, headache or visual disturbances   Complete by:  As directed    Call MD for:  persistant dizziness or light-headedness   Complete by:  As directed    Call MD for:  redness, tenderness, or signs of infection (pain, swelling, redness, odor or green/yellow discharge around incision site)   Complete by:  As directed    Call MD for:  severe uncontrolled pain   Complete by:  As directed    Call MD for:  temperature >100.4   Complete by:  As directed    Diet general   Complete by:  As directed    Driving Restrictions   Complete by:  As directed    Do not drive until given clearance.   Increase activity slowly   Complete by:  As directed    Lifting restrictions   Complete by:  As directed    Do not lift anything >10lbs. Avoid bending and twisting in awkward positions. Avoid bending at the back.   May shower / Bathe   Complete by:  As directed    In  24 hours. Okay to wash wound with warm soapy water. Avoid scrubbing the wound. Pat dry.   Remove dressing in 24 hours   Complete by:  As directed      Allergies as of 05/08/2018   No Known Allergies     Medication List    TAKE these medications   amLODipine-benazepril 5-20 MG capsule Commonly known as:  LOTREL Take 1 capsule by mouth daily.   aspirin EC 81 MG tablet Take 1 tablet (81 mg total) by mouth daily. Start taking on:  05/13/2018 What changed:  These instructions start on 05/13/2018. If you are unsure what to do until then, ask your doctor or other care provider.   brimonidine 0.1 % Soln Commonly known as:  ALPHAGAN P Place 1 drop into both eyes 2 (two) times daily.   cilostazol 100 MG tablet Commonly known as:  PLETAL Take 1 tablet (100 mg total) by mouth 2 (two) times daily.   dorzolamide-timolol 22.3-6.8 MG/ML ophthalmic solution Commonly known as:  COSOPT Place 1 drop into both eyes 2 (two) times daily.   fenofibrate 145 MG tablet Commonly known as:  TRICOR Take 145 mg by mouth daily.   metFORMIN 1000 MG tablet Commonly known as:  GLUCOPHAGE Take 1 tablet (1,000 mg total) by mouth 2 (two) times daily.   methocarbamol 750 MG tablet Commonly known as:  ROBAXIN Take 1 tablet (750 mg total) by mouth  3 (three) times daily as needed for muscle spasms.   metoprolol tartrate 25 MG tablet Commonly known as:  LOPRESSOR Take 25 mg by mouth 2 (two) times daily.   nitroGLYCERIN 0.4 MG SL tablet Commonly known as:  NITROSTAT Place 0.4 mg under the tongue every 5 (five) minutes as needed for chest pain.   omega-3 acid ethyl esters 1 g capsule Commonly known as:  LOVAZA Take 1 g by mouth 2 (two) times daily.   ONETOUCH DELICA LANCETS 33G Misc USE DAILY AS NEEDED   ONETOUCH VERIO test strip Generic drug:  glucose blood USE DAILY AS NEEDED   oxyCODONE-acetaminophen 7.5-325 MG tablet Commonly known as:  PERCOCET Take 1 tablet by mouth every 4 (four) hours as  needed.   phenylephrine 1 % nasal spray Commonly known as:  NEO-SYNEPHRINE Place 1 drop into both nostrils at bedtime as needed for congestion.   rosuvastatin 20 MG tablet Commonly known as:  CRESTOR Take 20 mg by mouth daily.   sodium chloride 0.65 % Soln nasal spray Commonly known as:  OCEAN Place 1 spray into both nostrils as needed for congestion.   TRAVATAN Z 0.004 % Soln ophthalmic solution Generic drug:  Travoprost (BAK Free) Place 1 drop into both eyes at bedtime.      Follow-up Information    Lisbeth Renshaw, MD. Schedule an appointment as soon as possible for a visit in 3 week(s).   Specialty:  Neurosurgery Contact information: [address] [place] [address] [phone]           Signed: Alyson Ingles 05/08/2018, 7:18 AM

## 2018-05-08 NOTE — Evaluation (Signed)
Occupational Therapy Evaluation Patient Details Name: Jorge Adams MRN: [id number] DOB: August 23, 1950 Today's Date: 05/08/2018    History of Present Illness Pt is a 67 y/o male s/p C5-6 ACDF. PMH includes DM, HTN, MI, and CAD s/p CABG.    Clinical Impression   Patient evaluated by Occupational Therapy with no further acute OT needs identified. All education has been completed and the patient has no further questions. See below for any follow-up Occupational Therapy or equipment needs. OT to sign off. Thank you for referral.      Follow Up Recommendations  No OT follow up    Equipment Recommendations  None recommended by OT    Recommendations for Other Services       Precautions / Restrictions Precautions Precautions: Cervical Precaution Booklet Issued: Yes (comment) Precaution Comments: Reviewed cervical precautions with pt.  regarding ADLs Restrictions Weight Bearing Restrictions: No      Mobility Bed Mobility               General bed mobility comments: up with PT on arrival  Transfers Overall transfer level: Needs assistance Equipment used: None Transfers: Sit to/from Stand Sit to Stand: Supervision         General transfer comment: pt requires cues of hands     Balance Overall balance assessment: Needs assistance Sitting-balance support: No upper extremity supported;Feet supported Sitting balance-Leahy Scale: Good     Standing balance support: Bilateral upper extremity supported;Single extremity supported;During functional activity Standing balance-Leahy Scale: Good Standing balance comment: Able to maintain static standing without UE support                            ADL either performed or assessed with clinical judgement   ADL Overall ADL's : Modified independent                                        Pt dressed on arrival and wife reports "he was able to do it" . Pt has shoes without laces to help with  maximizing independence.  .  Pt educated on need to notify doctor / RN of swallowing changes or choking..       Vision         Perception     Praxis      Pertinent Vitals/Pain Pain Assessment: No/denies pain     Hand Dominance Right   Extremity/Trunk Assessment Upper Extremity Assessment Upper Extremity Assessment: RUE deficits/detail;LUE deficits/detail RUE Deficits / Details: 4 out 5 hand grasp decr fine motor. educated on hand exercises  LUE Deficits / Details: 4 out 5 hand grasp decr fine motor. educated on hand exercises    Lower Extremity Assessment Lower Extremity Assessment: Defer to PT evaluation   Cervical / Trunk Assessment Cervical / Trunk Assessment: Other exceptions Cervical / Trunk Exceptions: s/p ACDF    Communication Communication Communication: No difficulties   Cognition Arousal/Alertness: Awake/alert Behavior During Therapy: WFL for tasks assessed/performed Overall Cognitive Status: Within Functional Limits for tasks assessed                                     General Comments  educated on clean linen and avoid washing directly on the cut    Exercises     Shoulder Instructions  Home Living Family/patient expects to be discharged to:: Private residence Living Arrangements: Spouse/significant other Available Help at Discharge: Available 24 hours/day;Family Type of Home: House Home Access: Stairs to enter [employer] of Steps: 3-4 Entrance Stairs-Rails: Right;Left;Can reach both Home Layout: One level     Bathroom Shower/Tub: Tub/shower unit;Walk-in shower   Bathroom Toilet: Standard     Home Equipment: Environmental consultant - 2 wheels;Cane - single point;Bedside commode          Prior Functioning/Environment Level of Independence: Independent                 OT Problem List: Decreased strength;Decreased activity tolerance;Impaired balance (sitting and/or standing)      OT Treatment/Interventions:       OT Goals(Current goals can be found in the care plan section) Acute Rehab OT Goals Patient Stated Goal: to go home   OT Frequency:     Barriers to D/C:            Co-evaluation              AM-PAC PT "6 Clicks" Daily Activity     Outcome Measure Help from another person eating meals?: None Help from another person taking care of personal grooming?: None Help from another person toileting, which includes using toliet, bedpan, or urinal?: None Help from another person bathing (including washing, rinsing, drying)?: None Help from another person to put on and taking off regular upper body clothing?: None Help from another person to put on and taking off regular lower body clothing?: None 6 Click Score: 24   End of Session Nurse Communication: Mobility status;Precautions  Activity Tolerance: Patient tolerated treatment well Patient left: in bed;with call bell/phone within reach  OT Visit Diagnosis: Muscle weakness (generalized) (M62.81)                Time: 4136-4383 OT Time Calculation (min): 11 min Charges:  OT General Charges $OT Visit: 1 Visit OT Evaluation $OT Eval Low Complexity: 1 Low   Jeri Modena, OTR/L  Acute Rehabilitation Services Pager: [phone] Office: [phone] .   Parke Poisson B 05/08/2018, 8:49 AM

## 2018-05-08 NOTE — Progress Notes (Signed)
Physical Therapy Treatment Patient Details Name: Jorge Adams MRN: [id number] DOB: 1950-07-27 Today's Date: 05/08/2018    History of Present Illness Pt is a 67 y/o male s/p C5-6 ACDF. PMH includes DM, HTN, MI, and CAD s/p CABG.     PT Comments    Pt progressing towards physical therapy goals. Was able to perform transfers and ambulation with gross min guard assist for balance support and safety with dynamic standing balance activity. Pt continues to demonstrate unsteadiness with ambulation however no overt LOB noted. Pt was educated on stair negotiation, car transfer, activity progression, and precautions. Will continue to follow.    Follow Up Recommendations  No PT follow up     Equipment Recommendations  None recommended by PT    Recommendations for Other Services       Precautions / Restrictions Precautions Precautions: Cervical Precaution Booklet Issued: Yes (comment) Precaution Comments: Reviewed cervical precautions with pt.  regarding ADLs Required Braces or Orthoses: Cervical Brace Restrictions Weight Bearing Restrictions: No    Mobility  Bed Mobility Overal bed mobility: Modified Independent Bed Mobility: Rolling;Sidelying to Sit Rolling: Modified independent (Device/Increase time) Sidelying to sit: Modified independent (Device/Increase time)       General bed mobility comments: HOB elevated but pt was able to perform without difficulty. Pt states he has been doing log roll for a long time prior to surgery.   Transfers Overall transfer level: Needs assistance Equipment used: None Transfers: Sit to/from Stand Sit to Stand: Supervision         General transfer comment: Pt demonstrated proper hand placement on seated surface for safety. Guarded with power-up to full stand and supervision provided for safety.   Ambulation/Gait Ambulation/Gait assistance: Supervision Gait Distance (Feet): 300 Feet Assistive device: None Gait Pattern/deviations:  Step-through pattern;Decreased stride length;Wide base of support Gait velocity: Decreased  Gait velocity interpretation: 1.31 - 2.62 ft/sec, indicative of limited community ambulator General Gait Details: Slow and guarded with unsteadiness noted. Pt reports balance has improved since surgery but is still not at baseline. VC's for hands out of pockets for more natural arm swing to improve balance.    Stairs Stairs: Yes Stairs assistance: Min guard Stair Management: One rail Right;Step to pattern;Forwards Number of Stairs: 10 General stair comments: VC's for sequencing and general safety.    Wheelchair Mobility    Modified Rankin (Stroke Patients Only)       Balance Overall balance assessment: Needs assistance Sitting-balance support: No upper extremity supported;Feet supported Sitting balance-Leahy Scale: Good     Standing balance support: Bilateral upper extremity supported;Single extremity supported;During functional activity Standing balance-Leahy Scale: Good Standing balance comment: Able to maintain static standing without UE support                             Cognition Arousal/Alertness: Awake/alert Behavior During Therapy: WFL for tasks assessed/performed Overall Cognitive Status: Within Functional Limits for tasks assessed                                        Exercises      General Comments General comments (skin integrity, edema, etc.): educated on clean linen and avoid washing directly on the cut      Pertinent Vitals/Pain Pain Assessment: Faces Faces Pain Scale: No hurt Pain Intervention(s): Monitored during session    Home Living Family/patient expects to be  discharged to:: Private residence Living Arrangements: Spouse/significant other Available Help at Discharge: Available 24 hours/day;Family Type of Home: House Home Access: Stairs to enter Entrance Stairs-Rails: Right;Left;Can reach both Home Layout: One  [place]: Environmental consultant - 2 wheels;Cane - single point;Bedside commode      Prior Function Level of Independence: Independent          PT Goals (current goals can now be found in the care plan section) Acute Rehab PT Goals Patient Stated Goal: to go home  PT Goal Formulation: With patient Time For Goal Achievement: 05/21/18 Potential to Achieve Goals: Good Progress towards PT goals: Progressing toward goals    Frequency    Min 5X/week      PT Plan Current plan remains appropriate    Co-evaluation              AM-PAC PT "6 Clicks" Daily Activity  Outcome Measure  Difficulty turning over in bed (including adjusting bedclothes, sheets and blankets)?: A Little Difficulty moving from lying on back to sitting on the side of the bed? : A Little Difficulty sitting down on and standing up from a chair with arms (e.g., wheelchair, bedside commode, etc,.)?: A Little Help needed moving to and from a bed to chair (including a wheelchair)?: A Little Help needed walking in hospital room?: A Little Help needed climbing 3-5 steps with a railing? : A Lot 6 Click Score: 17    End of Session Equipment Utilized During Treatment: Gait belt Activity Tolerance: Patient tolerated treatment well Patient left: in bed;with call bell/phone within reach;with family/visitor present Nurse Communication: Mobility status PT Visit Diagnosis: Unsteadiness on feet (R26.81);Other abnormalities of gait and mobility (R26.89);Pain Pain - part of body: (neck )     Time: 1914-7829 PT Time Calculation (min) (ACUTE ONLY): 10 min  Charges:  $Gait Training: 8-22 mins                     Rolinda Roan, PT, DPT Acute Rehabilitation Services Pager: [phone] Office: [phone]    Thelma Comp 05/08/2018, 10:56 AM

## 2018-05-10 ENCOUNTER — Telehealth: Payer: Self-pay | Admitting: Family Medicine

## 2018-05-10 NOTE — Telephone Encounter (Signed)
I called to check on patient.  I initially left a voicemail but did not leave any confidential information.  I called him the second time and was able to get through.  He is back at home.  He reports he had good care at the hospital.  His numbness is already improving.  I appreciate the help of all involved.  He has neurosurgery follow-up scheduled.  He thanked me for the call.  I wished him the best and thanked him for take in the call.

## 2018-05-11 ENCOUNTER — Other Ambulatory Visit: Payer: Self-pay | Admitting: Family Medicine

## 2018-05-11 ENCOUNTER — Other Ambulatory Visit: Payer: Self-pay | Admitting: *Deleted

## 2018-05-11 DIAGNOSIS — R69 Illness, unspecified: Secondary | ICD-10-CM | POA: Diagnosis not present

## 2018-05-11 MED ORDER — GLUCOSE BLOOD VI STRP: ORAL_STRIP | 3 refills | Status: DC

## 2018-05-27 ENCOUNTER — Telehealth: Payer: Self-pay

## 2018-05-27 NOTE — Telephone Encounter (Signed)
Shanon Brow with [employer] travel guard insurance wanted to verify that pt could travel when pt purchased the ticket and the travel ins. There is a pre existing exclusion when the pt buys the travel ins and needs to verify that at the time the ins was purchased that pt was medically able to travel.

## 2018-05-28 DIAGNOSIS — I10 Essential (primary) hypertension: Secondary | ICD-10-CM | POA: Diagnosis not present

## 2018-05-28 DIAGNOSIS — Z6829 Body mass index (BMI) 29.0-29.9, adult: Secondary | ICD-10-CM | POA: Diagnosis not present

## 2018-05-28 DIAGNOSIS — M4802 Spinal stenosis, cervical region: Secondary | ICD-10-CM | POA: Diagnosis not present

## 2018-05-29 ENCOUNTER — Telehealth: Payer: Self-pay | Admitting: Family Medicine

## 2018-05-29 NOTE — Telephone Encounter (Signed)
Pt dropped off letter that you was expecting. Thank you!

## 2018-05-29 NOTE — Telephone Encounter (Signed)
I have been thinking about this.  We shouldn't disclose any information to the 3rd party.  I can do a letter for the patient, to be given to the patient, and he can provide that to whomever he chooses.    In the meantime, I need details about the date of the trip and when he got the tickets.  Then I can work on the letter.  Thanks.

## 2018-05-29 NOTE — Telephone Encounter (Signed)
Spoke with patient who referred me to his wife about this matter because she is handling all the issues with the insurance.   Wife says she has typed up a note with all the information on it that she believes the insurance company needs and she will drop it by the office for Dr. Damita Dunnings to review and sign if he is agreeable.

## 2018-05-31 NOTE — Telephone Encounter (Signed)
I looked at the note.  I had to put it on our letterhead.  I shortened it, but the pertinent points are listed and this should suffice.  If this doesn't cover it then let me know.  Thanks.

## 2018-06-01 NOTE — Telephone Encounter (Signed)
Wife states the insurance company just called her to say that they had everything that they needed and her check had been cut and she would receive it in 7-10 days.  Letter shredded.

## 2018-06-09 ENCOUNTER — Encounter: Payer: Self-pay | Admitting: Family Medicine

## 2018-06-09 ENCOUNTER — Ambulatory Visit (INDEPENDENT_AMBULATORY_CARE_PROVIDER_SITE_OTHER): Payer: Medicare HMO | Admitting: Family Medicine

## 2018-06-09 VITALS — BP 130/74 | HR 79 | Temp 98.5°F | Ht 70.5 in | Wt 207.8 lb

## 2018-06-09 DIAGNOSIS — E1159 Type 2 diabetes mellitus with other circulatory complications: Secondary | ICD-10-CM

## 2018-06-09 DIAGNOSIS — R69 Illness, unspecified: Secondary | ICD-10-CM | POA: Diagnosis not present

## 2018-06-09 DIAGNOSIS — M4802 Spinal stenosis, cervical region: Secondary | ICD-10-CM | POA: Diagnosis not present

## 2018-06-09 DIAGNOSIS — Z23 Encounter for immunization: Secondary | ICD-10-CM

## 2018-06-09 DIAGNOSIS — G992 Myelopathy in diseases classified elsewhere: Secondary | ICD-10-CM

## 2018-06-09 LAB — POCT GLYCOSYLATED HEMOGLOBIN (HGB A1C): Hemoglobin A1C: 5.7 % — AB (ref 4.0–5.6)

## 2018-06-09 MED ORDER — BLOOD GLUCOSE METER KIT: PACK | 0 refills | Status: DC

## 2018-06-09 MED ORDER — METFORMIN HCL 1000 MG PO TABS: 500.0000 mg | ORAL_TABLET | Freq: Two times a day (BID) | ORAL | Status: DC

## 2018-06-09 NOTE — Progress Notes (Signed)
Diabetes:  Using medications without difficulties: yes Hypoglycemic episodes:no Hyperglycemic episodes:no Feet problems: occ tingling but this is better since prev surgery.   Blood Sugars averaging: ~120 usually.  eye exam within last year: f/u pending.  POC A1c done at [facility].   He is clearly better after the prev neck surgery.   Grip and sensation is back to normal except for minimal residual tingling.  His hip pain is gone.   Meds, vitals, and allergies reviewed.   ROS: Per HPI unless specifically indicated in ROS section   GEN: nad, alert and oriented HEENT: mucous membranes moist NECK: supple w/o LA CV: rrr. PULM: ctab, no inc wob ABD: soft, +bs EXT: no edema SKIN: no acute rash

## 2018-06-09 NOTE — Patient Instructions (Signed)
Cut back on the metformin.   Update me as needed.  Recheck in about 3 months.  The only lab you need to have done for your next diabetic visit is an A1c.  We can do this with a fingerstick test at the office visit.  You do not need a lab visit ahead of time for this.  It does not matter if you are fasting when the lab is done.   Take care.  Glad to see you.

## 2018-06-10 NOTE — Assessment & Plan Note (Signed)
No weakness.  He clearly feels better.  His sensation is back to normal except for minimal residual tingling in the extremities.  His hip pain is gone.  I am glad he is doing better.  He is happy with his outcome.  He still may make some continued progress in the future.  He will update me as needed.  I thank all involved.

## 2018-06-10 NOTE — Assessment & Plan Note (Signed)
A1c discussed with patient.  Given A1c, would cut back on the metformin.   Update me as needed.  Recheck in about 3 months.  See after visit summary.  He agrees.

## 2018-07-01 DIAGNOSIS — H401131 Primary open-angle glaucoma, bilateral, mild stage: Secondary | ICD-10-CM | POA: Diagnosis not present

## 2018-07-31 DIAGNOSIS — R69 Illness, unspecified: Secondary | ICD-10-CM | POA: Diagnosis not present

## 2018-08-19 ENCOUNTER — Other Ambulatory Visit: Payer: Self-pay | Admitting: Family Medicine

## 2018-08-27 DIAGNOSIS — G992 Myelopathy in diseases classified elsewhere: Secondary | ICD-10-CM | POA: Diagnosis not present

## 2018-09-03 ENCOUNTER — Ambulatory Visit: Payer: Medicare HMO

## 2018-09-03 ENCOUNTER — Other Ambulatory Visit: Payer: Self-pay | Admitting: Family Medicine

## 2018-09-03 ENCOUNTER — Ambulatory Visit (INDEPENDENT_AMBULATORY_CARE_PROVIDER_SITE_OTHER): Payer: Medicare HMO

## 2018-09-03 VITALS — BP 132/82 | HR 70 | Temp 98.0°F | Ht 69.0 in | Wt 215.5 lb

## 2018-09-03 DIAGNOSIS — E1159 Type 2 diabetes mellitus with other circulatory complications: Secondary | ICD-10-CM

## 2018-09-03 DIAGNOSIS — Z0000 Encounter for general adult medical examination without abnormal findings: Secondary | ICD-10-CM | POA: Diagnosis not present

## 2018-09-03 LAB — LIPID PANEL
CHOL/HDL RATIO: 3
Cholesterol: 139 mg/dL (ref 0–200)
HDL: 47.4 mg/dL (ref >39.00)
LDL Cholesterol: 69 mg/dL (ref 0–99)
NONHDL: 91.54
Triglycerides: 114 mg/dL (ref 0.0–149.0)
VLDL: 22.8 mg/dL (ref 0.0–40.0)

## 2018-09-03 LAB — COMPREHENSIVE METABOLIC PANEL
ALK PHOS: 52 U/L (ref 39–117)
ALT: 18 U/L (ref 0–53)
AST: 16 U/L (ref 0–37)
Albumin: 4.1 g/dL (ref 3.5–5.2)
BUN: 10 mg/dL (ref 6–23)
CO2: 29 meq/L (ref 19–32)
Calcium: 9.6 mg/dL (ref 8.4–10.5)
Chloride: 104 mEq/L (ref 96–112)
Creatinine, Ser: 0.85 mg/dL (ref 0.40–1.50)
GFR: 89.32 mL/min (ref >60.00)
GLUCOSE: 134 mg/dL — AB (ref 70–99)
Potassium: 5 mEq/L (ref 3.5–5.1)
SODIUM: 141 meq/L (ref 135–145)
TOTAL PROTEIN: 7 g/dL (ref 6.0–8.3)
Total Bilirubin: 0.6 mg/dL (ref 0.2–1.2)

## 2018-09-03 LAB — HEMOGLOBIN A1C: HEMOGLOBIN A1C: 6.6 % — AB (ref 4.6–6.5)

## 2018-09-03 NOTE — Progress Notes (Signed)
PCP notes:   Health maintenance:  Colonoscopy - addressed; pt will contact GI to schedure test A1C - completed  Abnormal screenings:   Fall risk - hx of single fall Fall Risk  09/03/2018 08/25/2017 02/20/2016  Falls in the past year? 1 No No  Number falls in past yr: 0 - -  Injury with Fall? 1 - -    Patient concerns:   None  Nurse concerns:  None  Next PCP appt:   09/15/18 @ 1045  I reviewed health advisor's note, was available for consultation on the day of service listed in this note, and agree with documentation and plan. Elsie Stain, MD.

## 2018-09-03 NOTE — Progress Notes (Signed)
Subjective:   Jorge Adams is a 68 y.o. male who presents for Medicare Annual/Subsequent preventive examination.  Review of Systems:  N/A Cardiac Risk Factors include: advanced age (>52mn, >>62women);male gender;obesity (BMI >30kg/m2);diabetes mellitus;dyslipidemia;hypertension     Objective:    Vitals: BP 132/82 (BP Location: Right Arm, Patient Position: Sitting, Cuff Size: Normal)   Pulse 70   Temp 98 F (36.7 C) (Oral)   Ht 5' 9" (1.753 m) Comment: no shoes  Wt 215 lb 8 oz (97.8 kg)   SpO2 93%   BMI 31.82 kg/m   Body mass index is 31.82 kg/m.  Advanced Directives 09/03/2018 05/07/2018 04/28/2018 08/25/2017 05/01/2016 02/20/2016 01/03/2016  Does Patient Have a Medical Advance Directive? _0  No No  Does patient want to make changes to medical advance directive? - - - - - - -  Would patient like information on creating a medical advance directive? No - Patient declined No - Patient declined No - Patient declined Yes (MAU/Ambulatory/Procedural Areas - Information given) No - patient declined information No - patient declined information No - patient declined information    Tobacco Social History   Tobacco Use  Smoking Status Former Smoker  . Last attempt to quit: 12/02/2002  . Years since quitting: 15.7  Smokeless Tobacco Never Used     Counseling given: No   Clinical Intake:  Pre-visit preparation completed: Yes  Pain : No/denies pain Pain Score: 0-No pain     Nutritional Status: BMI > 30  Obese Nutritional Risks: None Diabetes: Yes CBG done?: No Did pt. bring in CBG monitor from home?: No  How often do you need to have someone help you when you read instructions, pamphlets, or other written materials from your doctor or pharmacy?: 1 - Never What is the last grade level you completed in school?: 12th grade  Interpreter Needed?: No  Comments: pt lives with spouse Information entered by :: LPinson, LPN  Past Medical History:  Diagnosis Date    . Arthritis   . ASHD (arteriosclerotic heart disease)   . Atherosclerosis of native arteries of the extremities with intermittent claudication   . Cataract   . Coronary atherosclerosis of native coronary artery   . Diabetes mellitus    NIDDM  . Dyslipidemia   . ED (erectile dysfunction)   . Full dentures   . Glaucoma associated with ocular disorder, mild stage    bilateral  . Hypertension    under control, has been on med. > 9 yr.  . Impotence of organic origin   . Meniscus tear 05/2012   right knee  . [place]  . Stented coronary artery    Past Surgical History:  Procedure Laterality Date  . ANTERIOR CERVICAL DECOMP/DISCECTOMY FUSION N/A 05/07/2018   Procedure: ANTERIOR CERVICAL DECOMPRESSION/DISCECTOMY FUSION CERVICAL FIVE - CERVICAL SIX;  Surgeon: NConsuella Lose MD;  [place] [facility]  Service: Neurosurgery;  Laterality: N/A;  ANTERIOR CERVICAL DECOMPRESSION/DISCECTOMY FUSION CERVICAL FIVE - CERVICAL SIX  . CARDIAC CATHETERIZATION  10/23/2002   had 1 stent placed then-- by Dr. GMelvern Banker . CARDIAC CATHETERIZATION N/A 12/21/2015   Procedure: Left Heart Cath and Coronary Angiography;  Surgeon: JAdrian Prows MD;  [facility];  Service: Cardiovascular;  Laterality: N/A;  . CATARACT EXTRACTION Right 05/2017  . CATARACT EXTRACTION Left 07/2017  . COLONOSCOPY    . CORONARY ARTERY BYPASS GRAFT N/A 01/02/2016   Procedure: CORONARY ARTERY BYPASS GRAFTING (CABG) TIMES 3 USING LEFT INTERNAL  MAMMARY ARTERY AND RIGHT SAPHENOUS LEG VEIN HARVESTED ENDOSCOPICALLY;  Surgeon: Ivin Poot, MD;  Location: [facility];  Service: Open Heart Surgery;  Laterality: N/A;  . INSERTION OF MESH N/A 05/06/2016   Procedure: INSERTION OF MESH;  Surgeon: Georganna Skeans, MD;  Location: [facility];  Service: General;  Laterality: N/A;  . KNEE ARTHROSCOPY WITH MEDIAL MENISECTOMY  06/04/2012   Procedure: KNEE ARTHROSCOPY WITH MEDIAL MENISECTOMY;  Surgeon: Ninetta Lights, MD;   Location: [facility];  Service: Orthopedics;  Laterality: Right;  RIGHT SCOPE MEDIAL MENISCECTOMY, CHONDROPLASTY, EXCISION LOOSE BODY  . LOWER EXTREMITY ANGIOGRAM N/A 11/23/2013   Procedure: LOWER EXTREMITY ANGIOGRAM;  Surgeon: Laverda Page, MD;  Location: [facility] C[facility] LAB;  Service: Cardiovascular;  Laterality: N/A;  . PERIPHERAL VASCULAR CATHETERIZATION N/A 12/21/2015   Procedure: Abdominal Aortogram;  Surgeon: Adrian Prows, MD;  Location: [place] [facility];  Service: Cardiovascular;  Laterality: N/A;  . PERIPHERAL VASCULAR CATHETERIZATION N/A 12/21/2015   Procedure: Abdominal Aortogram w/Lower Extremity;  Surgeon: Adrian Prows, MD;  Location: [place] [facility];  Service: Cardiovascular;  Laterality: N/A;  . TEE WITHOUT CARDIOVERSION N/A 01/02/2016   Procedure: TRANSESOPHAGEAL ECHOCARDIOGRAM (TEE);  Surgeon: Ivin Poot, MD;  Location: [facility];  Service: Open Heart Surgery;  Laterality: N/A;  . UMBILICAL HERNIA REPAIR N/A 05/06/2016   Procedure: HERNIA REPAIR UMBILICAL ADULT;  Surgeon: Georganna Skeans, MD;  Location: [facility];  Service: General;  Laterality: N/A;   Family History  Problem Relation Age of Onset  . Bone cancer Mother   . Pneumonia Father   . Diabetes Brother   . Heart disease Brother   . COPD Brother   . Colon cancer Neg Hx   . Prostate cancer Neg Hx    Social History   Socioeconomic History  . Marital status: Married    Spouse name: Not on file  . Number of children: Not on file  . Years of education: Not on file  . Highest education level: Not on file  Occupational History  . Not on file  Social Needs  . Financial resource strain: Not on file  . Food insecurity:    Worry: Not on file    Inability: Not on file  . Transportation needs:    Medical: Not on file    Non-medical: Not on file  Tobacco Use  . Smoking status: Former Smoker    Last attempt to quit: 12/02/2002    Years since quitting: 15.7  . Smokeless tobacco: Never Used  Substance and Sexual  Activity  . Alcohol use: Yes    Comment: rare  . Drug use: No  . Sexual activity: Not on file  Lifestyle  . Physical activity:    Days per week: Not on file    Minutes per session: Not on file  . Stress: Not on file  Relationships  . Social connections:    Talks on phone: Not on file    Gets together: Not on file    Attends religious service: Not on file    Active member of club or organization: Not on file    Attends meetings of clubs or organizations: Not on file    Relationship status: Not on file  Other Topics Concern  . 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    Doristine Bosworth, [employer]  Outpatient Encounter Medications as of 09/03/2018  Medication Sig  . amLODipine-benazepril (LOTREL) 5-20 MG capsule Take 1 capsule by mouth daily.  Marland Kitchen aspirin EC 81 MG tablet Take 1 tablet (81 mg total) by mouth daily.  . blood glucose meter kit and supplies One touch Verio meter.  Use daily as needed to check sugar.  DM2.  E11.9. Dispense 1 meter.  . brimonidine (ALPHAGAN P) 0.1 % SOLN Place 1 drop into both eyes 2 (two) times daily.  . cilostazol (PLETAL) 100 MG tablet Take 1 tablet (100 mg total) by mouth 2 (two) times daily.  . dorzolamide-timolol (COSOPT) 22.3-6.8 MG/ML ophthalmic solution Place 1 drop into both eyes 2 (two) times daily.  . fenofibrate (TRICOR) 145 MG tablet Take 145 mg by mouth daily.  Marland Kitchen glucose blood (ONETOUCH VERIO) test strip USE 1 STRIP ONCE A DAY.  Diagnosis:  E11.9  Non insulin dependent.  . metFORMIN (GLUCOPHAGE) 1000 MG tablet TAKE 1 TABLET BY MOUTH TWICE A DAY (Patient taking differently: Take 500 mg by mouth 2 (two) times daily. )  . metoprolol tartrate (LOPRESSOR) 25 MG tablet Take 25 mg by mouth 2 (two) times daily.  . nitroGLYCERIN (NITROSTAT) 0.4 MG SL tablet Place 0.4 mg under the tongue every 5 (five) minutes as  needed for chest pain.   Marland Kitchen omega-3 acid ethyl esters (LOVAZA) 1 g capsule Take 1 g by mouth 2 (two) times daily.   Glory Rosebush DELICA LANCETS 81E MISC USE DAILY AS NEEDED  . ONETOUCH VERIO test strip USE 1 STRIP ONCE A DAY  . phenylephrine (NEO-SYNEPHRINE) 1 % nasal spray Place 1 drop into both nostrils at bedtime as needed for congestion.  . rosuvastatin (CRESTOR) 20 MG tablet Take 20 mg by mouth daily.   . sodium chloride (OCEAN) 0.65 % SOLN nasal spray Place 1 spray into both nostrils as needed for congestion.  . TRAVATAN Z 0.004 % SOLN ophthalmic solution Place 1 drop into both eyes at bedtime.   No facility-administered encounter medications on file as of 09/03/2018.     Activities of Daily Living In your present state of health, do you have any difficulty performing the following activities: 09/03/2018 05/07/2018  Hearing? N N  Vision? N N  Difficulty concentrating or making decisions? N N  Walking or climbing stairs? N N  Dressing or bathing? N Y  Doing errands, shopping? N N  Preparing Food and eating ? N -  Using the Toilet? N -  In the past six months, have you accidently leaked urine? N -  Do you have problems with loss of bowel control? N -  Managing your Medications? N -  Managing your Finances? N -  Housekeeping or managing your Housekeeping? N -  Some recent data might be hidden    Patient Care Team: [name], MD as PCP - General (Family Medicine) Adrian Prows, MD as Consulting Physician (Cardiology) [place], [place], [place] as Referring Physician (Optometry) Warden Fillers, MD as Consulting Physician (Ophthalmology)   Assessment:   This is a routine wellness examination for [place].   Hearing Screening   125Hz 250Hz 500Hz 1000Hz 2000Hz 3000Hz 4000Hz 6000Hz 8000Hz  Right ear:   40 40 40  40    Left ear:   40 40 40  40    Vision Screening Comments: Vision exam every 3 months with Dr. [place]   Exercise Activities and Dietary recommendations Current Exercise  Habits: Home exercise routine, Type of exercise: treadmill;Other - see comments(recumbent bike, elliptical ),  Time (Minutes): > 60(2 hours), Frequency (Times/Week): 3, Weekly Exercise (Minutes/Week): 0, Intensity: Moderate, Exercise limited by: None identified  Goals    . Increase physical activity     Starting 09/03/2018, I will continue to exercise for at least 120 minutes 3 days per week.        Fall Risk Fall Risk  09/03/2018 08/25/2017 02/20/2016  Falls in the past year? 1 No No  Number falls in past yr: 0 - -  Injury with Fall? 1 - -   Depression Screen PHQ 2/9 Scores 09/03/2018 08/25/2017 04/30/2016 02/20/2016  PHQ - 2 Score 0 0 0 0  PHQ- 9 Score 0 0 0 0    Cognitive Function MMSE - Mini Mental State Exam 09/03/2018 08/25/2017  Orientation to time 5 5  Orientation to Place 5 5  Registration 3 3  Attention/ Calculation 0 0  Recall 3 2  Recall-comments - unable to recall 1 of 3 words  Language- name 2 objects 0 0  Language- repeat 1 1  Language- follow 3 step command 3 3  Language- read & follow direction 0 0  Write a sentence 0 0  Copy design 0 0  Total score 20 19       PLEASE NOTE: A Mini-Cog screen was completed. Maximum score is 20. A value of 0 denotes this part of Folstein MMSE was not completed or the patient failed this part of the Mini-Cog screening.   Mini-Cog Screening Orientation to Time - Max 5 pts Orientation to Place - Max 5 pts Registration - Max 3 pts Recall - Max 3 pts Language Repeat - Max 1 pts Language Follow 3 Step Command - Max 3 pts   Immunization History  Administered Date(s) Administered  . Influenza Split 05/01/2012, 04/21/2013  . Influenza,inj,Quad PF,6+ Mos 06/06/2015, 06/24/2016, 04/25/2017, 06/09/2018  . Influenza-Unspecified 04/27/2014  . Pneumococcal Conjugate-13 12/02/2014  . Pneumococcal Polysaccharide-23 09/10/2010, 12/04/2015  . Tdap 09/10/2010  . Zoster 09/10/2010    Screening Tests Health Maintenance  Topic Date Due  .  COLONOSCOPY  07/22/2019 (Originally 10/12/2015)  . OPHTHALMOLOGY EXAM  12/21/2018  . HEMOGLOBIN A1C  03/04/2019  . FOOT EXAM  03/14/2019  . TETANUS/TDAP  09/10/2020  . INFLUENZA VACCINE  Completed  . Hepatitis C Screening  Completed  . PNA vac Low Risk Adult  Completed     Plan:     I have personally reviewed, addressed, and noted the following in the patient's chart:  A. Medical and social history B. Use of alcohol, tobacco or illicit drugs  C. Current medications and supplements D. Functional ability and status E.  Nutritional status F.  Physical activity G. Advance directives H. List of other physicians I.  Hospitalizations, surgeries, and ER visits in previous 12 months J.  [place] to include hearing, vision, cognitive, depression L. Referrals and appointments - none  In addition, I have reviewed and discussed with patient certain preventive protocols, quality metrics, and best practice recommendations. A written personalized care plan for preventive services as well as general preventive health recommendations were provided to patient.  See attached scanned questionnaire for additional information.   Signed,   Lindell Noe, MHA, BS, LPN Health Coach

## 2018-09-03 NOTE — Patient Instructions (Signed)
Jorge Adams , Thank you for taking time to come for your Medicare Wellness Visit. I appreciate your ongoing commitment to your health goals. Please review the following plan we discussed and let me know if I can assist you in the future.   These are the goals we discussed: Goals    . Increase physical activity     Starting 09/03/2018, I will continue to exercise for at least 120 minutes 3 days per week.        This is a list of the screening recommended for you and due dates:  Health Maintenance  Topic Date Due  . Colon Cancer Screening  07/22/2019*  . Eye exam for diabetics  12/21/2018  . Hemoglobin A1C  03/04/2019  . Complete foot exam   03/14/2019  . Tetanus Vaccine  09/10/2020  . Flu Shot  Completed  .  Hepatitis C: One time screening is recommended by Center for Disease Control  (CDC) for  adults born from [birth year] through 1967.   Completed  . Pneumonia vaccines  Completed  *Topic was postponed. The date shown is not the original due date.   Preventive Care for Adults  A healthy lifestyle and preventive care can promote health and wellness. Preventive health guidelines for adults include the following key practices.  . A routine yearly physical is a good way to check with your health care provider about your health and preventive screening. It is a chance to share any concerns and updates on your health and to receive a thorough exam.  . Visit your dentist for a routine exam and preventive care every 6 months. Brush your teeth twice a day and floss once a day. Good oral hygiene prevents tooth decay and gum disease.  . The frequency of eye exams is based on your age, health, family medical history, use  of contact lenses, and other factors. Follow your health care provider's recommendations for frequency of eye exams.  . Eat a healthy diet. Foods like vegetables, fruits, whole grains, low-fat dairy products, and lean protein foods contain the nutrients you need without too many  calories. Decrease your intake of foods high in solid fats, added sugars, and salt. Eat the right amount of calories for you. Get information about a proper diet from your health care provider, if necessary.  . Regular physical exercise is one of the most important things you can do for your health. Most adults should get at least 150 minutes of moderate-intensity exercise (any activity that increases your heart rate and causes you to sweat) each week. In addition, most adults need muscle-strengthening exercises on 2 or more days a week.  Silver Sneakers may be a benefit available to you. To determine eligibility, you may visit the website: www.silversneakers.com or contact program at (843)146-0548 Mon-Fri between 8AM-8PM.   . Maintain a healthy weight. The body mass index (BMI) is a screening tool to identify possible weight problems. It provides an estimate of body fat based on height and weight. Your health care provider can find your BMI and can help you achieve or maintain a healthy weight.   For adults 20 years and older: ? A BMI below 18.5 is considered underweight. ? A BMI of 18.5 to 24.9 is normal. ? A BMI of 25 to 29.9 is considered overweight. ? A BMI of 30 and above is considered obese.   . Maintain normal blood lipids and cholesterol levels by exercising and minimizing your intake of saturated fat. Eat a balanced diet  with plenty of fruit and vegetables. Blood tests for lipids and cholesterol should begin at age 68 and be repeated every 5 years. If your lipid or cholesterol levels are high, you are over 50, or you are at high risk for heart disease, you may need your cholesterol levels checked more frequently. Ongoing high lipid and cholesterol levels should be treated with medicines if diet and exercise are not working.  . If you smoke, find out from your health care provider how to quit. If you do not use tobacco, please do not start.  . If you choose to drink alcohol, please do  not consume more than 2 drinks per day. One drink is considered to be 12 ounces (355 mL) of beer, 5 ounces (148 mL) of wine, or 1.5 ounces (44 mL) of liquor.  . If you are 43-32 years old, ask your health care provider if you should take aspirin to prevent strokes.  . Use sunscreen. Apply sunscreen liberally and repeatedly throughout the day. You should seek shade when your shadow is shorter than you. Protect yourself by wearing long sleeves, pants, a wide-brimmed hat, and sunglasses year round, whenever you are outdoors.  . Once a month, do a whole body skin exam, using a mirror to look at the skin on your back. Tell your health care provider of new moles, moles that have irregular borders, moles that are larger than a pencil eraser, or moles that have changed in shape or color.

## 2018-09-11 ENCOUNTER — Encounter: Payer: Medicare HMO | Admitting: Family Medicine

## 2018-09-15 ENCOUNTER — Encounter: Payer: Self-pay | Admitting: Family Medicine

## 2018-09-15 ENCOUNTER — Ambulatory Visit (INDEPENDENT_AMBULATORY_CARE_PROVIDER_SITE_OTHER): Payer: Medicare HMO | Admitting: Family Medicine

## 2018-09-15 DIAGNOSIS — I152 Hypertension secondary to endocrine disorders: Secondary | ICD-10-CM

## 2018-09-15 DIAGNOSIS — I10 Essential (primary) hypertension: Secondary | ICD-10-CM

## 2018-09-15 DIAGNOSIS — R202 Paresthesia of skin: Secondary | ICD-10-CM

## 2018-09-15 DIAGNOSIS — Z7189 Other specified counseling: Secondary | ICD-10-CM

## 2018-09-15 DIAGNOSIS — E785 Hyperlipidemia, unspecified: Secondary | ICD-10-CM

## 2018-09-15 DIAGNOSIS — Z0000 Encounter for general adult medical examination without abnormal findings: Secondary | ICD-10-CM

## 2018-09-15 DIAGNOSIS — E1159 Type 2 diabetes mellitus with other circulatory complications: Secondary | ICD-10-CM | POA: Diagnosis not present

## 2018-09-15 MED ORDER — METFORMIN HCL 1000 MG PO TABS: 500.0000 mg | ORAL_TABLET | Freq: Two times a day (BID) | ORAL | Status: DC

## 2018-09-15 NOTE — Progress Notes (Signed)
Diabetes:  Using medications without difficulties: yes Hypoglycemic episodes:no Hyperglycemic episodes:no Feet problems: mild tingling, likely from neck, see below.   Blood Sugars averaging: ~140s eye exam within last year:  yes A1c up to but still controlled, d/w pt about labs.    Hypertension:    Using medication without problems or lightheadedness: yes Chest pain with exertion:no Edema:no Short of breath:no  Elevated Cholesterol: Using medications without problems:yes Muscle aches: no Diet compliance: yes Exercise:yes Labs d/w pt.    He has a little residual numbness in the arms but is clearly better from prev.  He had neurosurgery f/u.    Prostate cancer screening and PSA options (with potential risks and benefits of testing vs not testing) were discussed along with recent recs/guidelines. He declined testing PSA at this point.  No LUTS.  Living will d/w pt. Wife designated if patient were incapacitated. Colonoscopy - due.  D/w pt.  He'll call about that.   Diet and exercise d/w pt.  [facility]  He is biking 5 miles and walking 1 mile.   Tetanus 2012 PNA up to date.  shingrix out of stock.  Flu 2019  PMH and SH reviewed  Meds, vitals, and allergies reviewed.   ROS: Per HPI unless specifically indicated in ROS section   GEN: nad, alert and oriented HEENT: mucous membranes moist NECK: supple w/o LA CV: rrr. PULM: ctab, no inc wob ABD: soft, +bs EXT: no edema SKIN: no acute rash  Diabetic foot exam: Normal inspection No skin breakdown No calluses  Normal DP pulses Normal sensation to light touch and monofilament Nails normal

## 2018-09-15 NOTE — Patient Instructions (Addendum)
Call about a colonoscopy when you can.    Keep working on diet and exercise.   If you have any low sugars, then cut back on the metformin and let me know.   Recheck in about 3-4 months.    The only lab you need to have done for your next diabetic visit is an A1c.  We can do this with a fingerstick test at the office visit.  You do not need a lab visit ahead of time for this.  It does not matter if you are fasting when the lab is done.    Update me as needed.  Take care.  Glad to see you.

## 2018-09-16 ENCOUNTER — Other Ambulatory Visit: Payer: Self-pay | Admitting: Family Medicine

## 2018-09-16 DIAGNOSIS — R69 Illness, unspecified: Secondary | ICD-10-CM | POA: Diagnosis not present

## 2018-09-17 DIAGNOSIS — R69 Illness, unspecified: Secondary | ICD-10-CM | POA: Diagnosis not present

## 2018-09-17 NOTE — Assessment & Plan Note (Signed)
He has a little residual numbness in the arms but is clearly better from prev.  He had neurosurgery f/u.

## 2018-09-17 NOTE — Assessment & Plan Note (Signed)
Labs discussed with patient.  No change in meds.  Continue work on diet and exercise.  He agrees.  Recheck periodically.  See after visit summary. >25 minutes spent in face to face time with patient, >50% spent in counselling or coordination of care.

## 2018-09-17 NOTE — Assessment & Plan Note (Signed)
Prostate cancer screening and PSA options (with potential risks and benefits of testing vs not testing) were discussed along with recent recs/guidelines. He declined testing PSA at this point.  No LUTS.  Living will d/w pt. Wife designated if patient were incapacitated.  Colonoscopy - due.  D/w pt.  He'll call about that.   Diet and exercise d/w pt.  [facility]  He is biking 5 miles and walking 1 mile.   Tetanus 2012 PNA up to date.  shingrix out of stock.  Flu 2019

## 2018-09-17 NOTE — Assessment & Plan Note (Signed)
A1c up to but still controlled, d/w pt about labs.  No change in meds.  Continue work on diet and exercise.  He agrees.  Recheck periodically.  See after visit summary.

## 2018-09-17 NOTE — Assessment & Plan Note (Signed)
No change in meds.  Continue work on diet and exercise.  He agrees.  Recheck periodically.  See after visit summary.

## 2018-09-17 NOTE — Assessment & Plan Note (Signed)
Living will d/w pt.  Wife designated if patient were incapacitated.   ?

## 2018-09-20 ENCOUNTER — Other Ambulatory Visit: Payer: Self-pay | Admitting: Family Medicine

## 2018-09-21 NOTE — Telephone Encounter (Signed)
Electronic refill request. Cilostazol Last office visit:   09/15/2018 Last Filled:    180 tablet 3 09/02/2017  Please advise.

## 2018-09-22 NOTE — Telephone Encounter (Signed)
Sent. Thanks.   

## 2018-09-30 DIAGNOSIS — Z7984 Long term (current) use of oral hypoglycemic drugs: Secondary | ICD-10-CM | POA: Diagnosis not present

## 2018-09-30 DIAGNOSIS — H401131 Primary open-angle glaucoma, bilateral, mild stage: Secondary | ICD-10-CM | POA: Diagnosis not present

## 2018-09-30 DIAGNOSIS — Z961 Presence of intraocular lens: Secondary | ICD-10-CM | POA: Diagnosis not present

## 2018-09-30 DIAGNOSIS — E119 Type 2 diabetes mellitus without complications: Secondary | ICD-10-CM | POA: Diagnosis not present

## 2018-10-28 ENCOUNTER — Other Ambulatory Visit: Payer: Self-pay

## 2018-10-28 MED ORDER — OMEGA-3-ACID ETHYL ESTERS 1 G PO CAPS: 1.0000 g | ORAL_CAPSULE | Freq: Two times a day (BID) | ORAL | 0 refills | Status: DC

## 2018-11-05 DIAGNOSIS — R69 Illness, unspecified: Secondary | ICD-10-CM | POA: Diagnosis not present

## 2018-11-17 ENCOUNTER — Other Ambulatory Visit: Payer: Self-pay | Admitting: Cardiology

## 2018-11-17 MED ORDER — AMLODIPINE BESY-BENAZEPRIL HCL 5-20 MG PO CAPS: 1.0000 | ORAL_CAPSULE | Freq: Every day | ORAL | 1 refills | Status: DC

## 2018-12-16 DIAGNOSIS — R69 Illness, unspecified: Secondary | ICD-10-CM | POA: Diagnosis not present

## 2018-12-23 DIAGNOSIS — R69 Illness, unspecified: Secondary | ICD-10-CM | POA: Diagnosis not present

## 2018-12-31 DIAGNOSIS — H401131 Primary open-angle glaucoma, bilateral, mild stage: Secondary | ICD-10-CM | POA: Diagnosis not present

## 2019-01-11 ENCOUNTER — Other Ambulatory Visit: Payer: Self-pay

## 2019-01-11 ENCOUNTER — Encounter: Payer: Self-pay | Admitting: Family Medicine

## 2019-01-11 ENCOUNTER — Ambulatory Visit (INDEPENDENT_AMBULATORY_CARE_PROVIDER_SITE_OTHER): Payer: Medicare HMO | Admitting: Family Medicine

## 2019-01-11 VITALS — BP 110/72 | HR 68 | Temp 96.9°F | Ht 69.0 in | Wt 219.4 lb

## 2019-01-11 DIAGNOSIS — E1159 Type 2 diabetes mellitus with other circulatory complications: Secondary | ICD-10-CM

## 2019-01-11 LAB — POCT GLYCOSYLATED HEMOGLOBIN (HGB A1C): Hemoglobin A1C: 7.3 % — AB (ref 4.0–5.6)

## 2019-01-11 NOTE — Progress Notes (Signed)
Diabetes:  Using medications without difficulties: taking metformin 500mg  BID.   Hypoglycemic episodes:no Hyperglycemic episodes:no Feet problems: minimal tingling in feet, his hands clearly better from prev.   Blood Sugars averaging: usually ~ 120-130s usually.   Sugar affected with [employer] closure, d/w pt.  eye exam within last year:  A1c done at [facility].  7.3, up from prev.  dw pt.   R hamstring recently sore, about 1 week ago.  Not better in the meantime.  More pain after prolonged sitting, better with activity.  No new weakness.  No trauma, no injury.  No B/B sx.    He isn't lightheaded.   Meds, vitals, and allergies reviewed.   ROS: Per HPI unless specifically indicated in ROS section   GEN: nad, alert and oriented HEENT: ncat NECK: supple w/o LA CV: rrr. PULM: ctab, no inc wob ABD: soft, +bs EXT: no edema SKIN: no acute rash R SLR neg.  Able to bear weight.  Right hamstring slightly sore. Midline back not ttp.

## 2019-01-11 NOTE — Patient Instructions (Addendum)
Try ice for now and gently stretch prior to getting up for likely hamstring pain/strain.  Don't change your metformin for now.  Recheck in about 4 months.

## 2019-01-13 NOTE — Assessment & Plan Note (Signed)
No change in meds at this point, recheck in a few months.  He'll work on diet and exercise in the meantime.  He agrees.  Hamstring strain appears to be a separate incidental issue.  Discussed options.  He can gently stretch, see after visit summary.  He will update me as needed.

## 2019-01-22 ENCOUNTER — Other Ambulatory Visit: Payer: Self-pay | Admitting: Cardiology

## 2019-02-08 ENCOUNTER — Ambulatory Visit: Payer: Self-pay | Admitting: Cardiology

## 2019-02-16 DIAGNOSIS — R69 Illness, unspecified: Secondary | ICD-10-CM | POA: Diagnosis not present

## 2019-02-24 DIAGNOSIS — M4802 Spinal stenosis, cervical region: Secondary | ICD-10-CM | POA: Diagnosis not present

## 2019-02-24 DIAGNOSIS — G992 Myelopathy in diseases classified elsewhere: Secondary | ICD-10-CM | POA: Diagnosis not present

## 2019-02-24 DIAGNOSIS — M545 Low back pain: Secondary | ICD-10-CM | POA: Diagnosis not present

## 2019-03-10 ENCOUNTER — Other Ambulatory Visit: Payer: Self-pay | Admitting: Family Medicine

## 2019-03-10 ENCOUNTER — Encounter: Payer: Self-pay | Admitting: Cardiology

## 2019-03-10 MED ORDER — METFORMIN HCL 500 MG PO TABS: 500.0000 mg | ORAL_TABLET | Freq: Two times a day (BID) | ORAL | 0 refills | Status: DC

## 2019-03-11 ENCOUNTER — Ambulatory Visit (INDEPENDENT_AMBULATORY_CARE_PROVIDER_SITE_OTHER): Payer: Medicare HMO | Admitting: Cardiology

## 2019-03-11 ENCOUNTER — Other Ambulatory Visit: Payer: Self-pay

## 2019-03-11 ENCOUNTER — Encounter: Payer: Self-pay | Admitting: Cardiology

## 2019-03-11 VITALS — BP 133/75 | HR 85 | Ht 69.0 in | Wt 217.4 lb

## 2019-03-11 DIAGNOSIS — R0600 Dyspnea, unspecified: Secondary | ICD-10-CM

## 2019-03-11 DIAGNOSIS — E1151 Type 2 diabetes mellitus with diabetic peripheral angiopathy without gangrene: Secondary | ICD-10-CM

## 2019-03-11 DIAGNOSIS — I10 Essential (primary) hypertension: Secondary | ICD-10-CM | POA: Diagnosis not present

## 2019-03-11 DIAGNOSIS — I2510 Atherosclerotic heart disease of native coronary artery without angina pectoris: Secondary | ICD-10-CM

## 2019-03-11 DIAGNOSIS — R0609 Other forms of dyspnea: Secondary | ICD-10-CM

## 2019-03-11 DIAGNOSIS — Z951 Presence of aortocoronary bypass graft: Secondary | ICD-10-CM

## 2019-03-11 DIAGNOSIS — I739 Peripheral vascular disease, unspecified: Secondary | ICD-10-CM | POA: Diagnosis not present

## 2019-03-11 DIAGNOSIS — M545 Low back pain: Secondary | ICD-10-CM | POA: Diagnosis not present

## 2019-03-11 NOTE — Progress Notes (Signed)
Primary Physician/Referring:  [name], MD  Patient ID: Jorge Adams, male    DOB: [birth year], 68 y.o.   MRN: [id number]  Chief Complaint  Patient presents with  . Coronary Artery Disease    1 year f/u   HPI:    HPI: Jorge Adams  is a 68 y.o. coronary artery disease status post CABG 3 in 12/2015, known peripheral artery disease (angiography in 2015 revealing bilateral SFA cauliflower-like severe diffuse mixed atheresclerotic lesions with bilateral severe disease in the anterior and peroneal arteries), no significant disease was noted in the iliac vessels. He has type II diagnosed mellitus controlled, mixed hyperlipidemia and hypertension here for 6 month follow-up.  He has not been able to exercise much since the pandemic. Weight is up and also HgbA1c since last visit. He reports right leg sciatica and hip pain and is having MRI later today for further evaluation.   He denies any chest pain, but has noticed over the last few months, shortness of breath with walking uphill. He does report; however, visiting the [place] last weekend and was able to do a lot of walking. He does have to stop for breaks, but mostly due to his hips. No PND, orthopnea, or leg edema.  States hypertension and hyperlipidemia have been well controlled.   Past Medical History:  Diagnosis Date  . Arthritis   . ASHD (arteriosclerotic heart disease)   . Atherosclerosis of native arteries of the extremities with intermittent claudication   . Cataract   . Coronary atherosclerosis of native coronary artery   . Diabetes mellitus    NIDDM  . Dyslipidemia   . ED (erectile dysfunction)   . Full dentures   . Glaucoma associated with ocular disorder, mild stage    bilateral  . Hypertension    under control, has been on med. > 9 yr.  . Impotence of organic origin   . Meniscus tear 05/2012   right knee  . Myocardial infarction ([place]) 10/2002  . Stented coronary artery    Past Surgical History:   Procedure Laterality Date  . ANTERIOR CERVICAL DECOMP/DISCECTOMY FUSION N/A 05/07/2018   Procedure: ANTERIOR CERVICAL DECOMPRESSION/DISCECTOMY FUSION CERVICAL FIVE - CERVICAL SIX;  Surgeon: Consuella Lose, MD;  Location: [place] [facility];  Service: Neurosurgery;  Laterality: N/A;  ANTERIOR CERVICAL DECOMPRESSION/DISCECTOMY FUSION CERVICAL FIVE - CERVICAL SIX  . CARDIAC CATHETERIZATION  10/23/2002   had 1 stent placed then-- by Dr. Melvern Banker  . CARDIAC CATHETERIZATION N/A 12/21/2015   Procedure: Left Heart Cath and Coronary Angiography;  Surgeon: Adrian Prows, MD;  Location: [facility];  Service: Cardiovascular;  Laterality: N/A;  . CATARACT EXTRACTION Right 05/2017  . CATARACT EXTRACTION Left 07/2017  . COLONOSCOPY    . CORONARY ARTERY BYPASS GRAFT N/A 01/02/2016   Procedure: CORONARY ARTERY BYPASS GRAFTING (CABG) TIMES 3 USING LEFT INTERNAL MAMMARY ARTERY AND RIGHT SAPHENOUS LEG VEIN HARVESTED ENDOSCOPICALLY;  Surgeon: Ivin Poot, MD;  Location: [place];  Service: Open Heart Surgery;  Laterality: N/A;  . INSERTION OF MESH N/A 05/06/2016   Procedure: INSERTION OF MESH;  Surgeon: Georganna Skeans, MD;  Location: [place];  Service: General;  Laterality: N/A;  . KNEE ARTHROSCOPY WITH MEDIAL MENISECTOMY  06/04/2012   Procedure: KNEE ARTHROSCOPY WITH MEDIAL MENISECTOMY;  Surgeon: Ninetta Lights, MD;  Location: [place];  Service: Orthopedics;  Laterality: Right;  RIGHT SCOPE MEDIAL MENISCECTOMY, CHONDROPLASTY, EXCISION LOOSE BODY  . LOWER EXTREMITY ANGIOGRAM N/A 11/23/2013   Procedure: LOWER EXTREMITY  ANGIOGRAM;  Surgeon: Laverda Page, MD;  Location: [facility];  Service: Cardiovascular;  Laterality: N/A;  . NECK SURGERY N/A    02/19/2018  . PERIPHERAL VASCULAR CATHETERIZATION N/A 12/21/2015   Procedure: Abdominal Aortogram;  Surgeon: Adrian Prows, MD;  Location: [place] [facility];  Service: Cardiovascular;  Laterality: N/A;  . PERIPHERAL VASCULAR CATHETERIZATION N/A 12/21/2015   Procedure:  Abdominal Aortogram w/Lower Extremity;  Surgeon: Adrian Prows, MD;  Location: [place] [facility];  Service: Cardiovascular;  Laterality: N/A;  . TEE WITHOUT CARDIOVERSION N/A 01/02/2016   Procedure: TRANSESOPHAGEAL ECHOCARDIOGRAM (TEE);  Surgeon: Ivin Poot, MD;  Location: [place];  Service: Open Heart Surgery;  Laterality: N/A;  . UMBILICAL HERNIA REPAIR N/A 05/06/2016   Procedure: HERNIA REPAIR UMBILICAL ADULT;  Surgeon: Georganna Skeans, MD;  Location: [place];  Service: General;  Laterality: N/A;   Social History   Socioeconomic History  . Marital status: Married    Spouse name: Not on file  . Number of children: 2  . Years of education: Not on file  . Highest education level: Not on file  Occupational History  . Occupation: retired  Scientific laboratory technician  . Financial resource strain: Not on file  . Food insecurity    Worry: Not on file    Inability: Not on file  . Transportation needs    Medical: Not on file    Non-medical: Not on file  Tobacco Use  . Smoking status: Former Smoker    Quit date: 12/02/2002    Years since quitting: 16.2  . Smokeless tobacco: Never Used  Substance and Sexual Activity  . Alcohol use: Yes    Comment: rare  . Drug use: No  . Sexual activity: Not on file  Lifestyle  . Physical activity    Days per week: Not on file    Minutes per session: Not on file  . Stress: Not on file  Relationships  . Social Herbalist on phone: Not on file    Gets together: Not on file    Attends religious service: Not on file    Active member of club or organization: Not on file    Attends meetings of clubs or organizations: Not on file    Relationship status: Not on file  . Intimate partner violence    Fear of current or ex partner: Not on file    Emotionally abused: Not on file    Physically abused: Not on file    Forced sexual activity: Not on file  Other Topics Concern  . 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    [employer], [employer], [employer] fan   ROS  Review of Systems  Constitution: Positive for malaise/fatigue. Negative for decreased appetite, weight gain and weight loss.  Eyes: Negative for visual disturbance.  Cardiovascular: Positive for claudication (hips bilateral chronic) and dyspnea on exertion. Negative for chest pain, leg swelling, orthopnea, palpitations and syncope.  Respiratory: Negative for hemoptysis and wheezing.   Endocrine: Negative for cold intolerance and heat intolerance.  Hematologic/Lymphatic: Does not bruise/bleed easily.  Skin: Negative for nail changes.  Musculoskeletal: Positive for back pain and joint pain (bilateral hip left worse and bilateral knee right worse). Negative for muscle weakness and myalgias.  Gastrointestinal: Negative for abdominal pain, change in bowel habit, nausea and vomiting.  Neurological: Negative for difficulty with concentration, dizziness, focal weakness and headaches.  Psychiatric/Behavioral: Negative for altered mental status and suicidal ideas.  All other systems reviewed and are negative.  Objective  Blood pressure 133/75, pulse 85, height 5' 9"  (1.753 m), weight 217 lb 6.4 oz (98.6 kg), SpO2 96 %. Body mass index is 32.1 kg/m.   Physical Exam  Constitutional: He is oriented to person, place, and time. Vital signs are normal. He appears well-developed and well-nourished.  overweight  HENT:  Head: Normocephalic and atraumatic.  Neck: Normal range of motion.  Cardiovascular: Normal rate, regular rhythm, normal heart sounds and intact distal pulses.  Pulses:      Femoral pulses are 1+ on the right side and 1+ on the left side.      Popliteal pulses are 1+ on the right side and 0 on the left side.       Dorsalis pedis pulses are 0 on the right side and 0 on the left side.       Posterior tibial pulses are 0 on the right side and 0 on the left side.   Loss of hair  Pulmonary/Chest: Effort normal and breath sounds normal. No accessory muscle usage. No respiratory distress.  CABG/sternotomy scar  Abdominal: Soft. Bowel sounds are normal.  Hernia repair scar noted  Musculoskeletal: Normal range of motion.  Neurological: He is alert and oriented to person, place, and time.  Skin: Skin is warm and dry.  Vitals reviewed.  Radiology: No results found.  Laboratory examination:   Recent Labs    04/10/18 1157 04/28/18 1040 05/08/18 0527 09/03/18 0841  NA 143 138 136 141  K 4.8 3.6 3.9 5.0  CL 104 106 100 104  CO2 22 24 24 29   GLUCOSE 126* 172* 171* 134*  BUN 8 8 7* 10  CREATININE 0.87 0.76 0.90 0.85  CALCIUM 9.6 9.1 9.4 9.6  GFRNONAA 89 >60 >60  --   GFRAA 103 >60 >60  --    CMP Latest Ref Rng & Units 09/03/2018 05/08/2018 04/28/2018  Glucose 70 - 99 mg/dL 134(H) 171(H) 172(H)  BUN 6 - 23 mg/dL 10 7(L) 8  Creatinine 0.40 - 1.50 mg/dL 0.85 0.90 0.76  Sodium 135 - 145 mEq/L 141 136 138  Potassium 3.5 - 5.1 mEq/L 5.0 3.9 3.6  Chloride 96 - 112 mEq/L 104 100 106  CO2 19 - 32 mEq/L 29 24 24   Calcium 8.4 - 10.5 mg/dL 9.6 9.4 9.1  Total Protein 6.0 - 8.3 g/dL 7.0 - -  Total Bilirubin 0.2 - 1.2 mg/dL 0.6 - -  Alkaline Phos 39 - 117 U/L 52 - -  AST 0 - 37 U/L 16 - -  ALT 0 - 53 U/L 18 - -   CBC Latest Ref Rng & Units 05/08/2018 04/28/2018 03/13/2018  WBC 4.0 - 10.5 K/uL 10.8(H) 6.2 5.5  Hemoglobin 13.0 - 17.0 g/dL 14.2 14.1 14.5  Hematocrit 39.0 - 52.0 % 42.5 42.4 42.3  Platelets 150 - 400 K/uL 166 152 153.0   Lipid Panel     Component Value Date/Time   CHOL 139 09/03/2018 0841   CHOL 155 05/02/2017   TRIG 114.0 09/03/2018 0841   TRIG 300 05/02/2017   HDL 47.40 09/03/2018 0841   HDL 40 05/02/2017   CHOLHDL 3 09/03/2018 0841   VLDL 22.8 09/03/2018 0841   LDLCALC 69 09/03/2018 0841   LDLCALC 55 05/02/2017   LDLDIRECT 70.0 11/29/2015 0847   HEMOGLOBIN A1C Lab Results  Component  Value Date   HGBA1C 7.3 (A) 01/11/2019    MPG 116.89 04/28/2018   TSH Recent Labs    03/13/18 0859  TSH 2.68   Medications   Current Outpatient Medications  Medication Instructions  . amLODipine-benazepril (LOTREL) 5-20 MG capsule 1 capsule, Oral, Daily  . aspirin EC 81 mg, Oral, Daily  . blood glucose meter kit and supplies One touch Verio meter.  Use daily as needed to check sugar.  DM2.  E11.9. Dispense 1 meter.  . brimonidine (ALPHAGAN P) 0.1 % SOLN 1 drop, Both Eyes, 2 times daily  . cilostazol (PLETAL) 100 MG tablet TAKE 1 TABLET BY MOUTH TWICE A DAY  . dorzolamide-timolol (COSOPT) 22.3-6.8 MG/ML ophthalmic solution 1 drop, Both Eyes, 2 times daily  . fenofibrate (TRICOR) 145 mg, Oral, Daily  . glucose blood (ONETOUCH VERIO) test strip USE 1 STRIP ONCE A DAY.  Diagnosis:  E11.9  Non insulin dependent.  . metFORMIN (GLUCOPHAGE) 500 mg, Oral, 2 times daily  . metoprolol tartrate (LOPRESSOR) 25 mg, Oral, 2 times daily  . nitroGLYCERIN (NITROSTAT) 0.4 mg, Sublingual, Every 5 min PRN  . omega-3 acid ethyl esters (LOVAZA) 1 g, Oral, 2 times daily  . ONETOUCH DELICA LANCETS 91M MISC USE TO TEST BLOOD SUGAR ONCE DAILY AS NEEDED  . ONETOUCH VERIO test strip USE 1 STRIP ONCE A DAY  . phenylephrine (NEO-SYNEPHRINE) 1 % nasal spray 1 drop, Each Nare, At bedtime PRN  . rosuvastatin (CRESTOR) 20 mg, Oral, Daily  . TRAVATAN Z 0.004 % SOLN ophthalmic solution 1 drop, Both Eyes, Daily at bedtime    Cardiac Studies:   Echocardiogram 12/13/2015: Left ventricle cavity is normal in size. Mild concentric hypertrophy of the left ventricle. Doppler evidence of grade II (pseudonormal) diastolic dysfunction. Diastolic dysfunction findings suggests elevated LA/LV end diastolic pressure. Mild infero apical and apical lateral hypokinesis. Normal LVEF. Calculated EF 55%. Trace tricuspid regurgitation. No evidence of pulmonary hypertension.  Coronary angiogram 12/21/2015:  1. Normal LV systolic function, EF 38-46%. 2. Severe triple-vessel  coronary artery disease. Long segment occlusion, CTO of proximal mid distal RCA,large branches collateralized by Type III collaterals from the distal LAD Continuing through PDA branch, several septal perforators giving collaterals to the distal RCA. 3. Proximal LAD with severe diffuse calcification and tandem 80-85% stenosis. Mid to distal LAD has mild disease, tortuous. Moderate-sized diagonals with mild disease. 4. Circumflex coronary artery is a moderate-sized vessel. Mid-segment with a 40% stenosis at the origin of a large OM1 which has 3 secondary branches. This OM1 has a old stent which is widely patent in the proximal segment however the ostium of the OM 1 has a high-grade 90% stenosis. 5. Abdominal aortogram with limited bifemoral arteriogram Revealing mild disease in the abdominal aorta and in the aortoiliac bifurcation and proximal common femoral artery. Proximal SFA is widely patent bilaterally. Mild calcification is evident.  Exercise sestamibi stress test 12/08/2015: 1. The resting electrocardiogram demonstrated normal sinus rhythm, normal resting conduction, no resting arrhythmias and normal rest repolarization. The stress electrocardiogram was abnormal. There was no ST-T changes of ischemia, ut in the reovery there were frequent PVC, V-Trigeminy and ventricular couplets. Patient exercised on Bruce protocol for 4.00 minutes and achieved 4.64 METS. Stress test terminated due to dyspnea and target heart rate (94 % MPHR). 2. The perfusion imaging study demonstrates a moderate to large size inferior and inferolateral severe ischemia extending from the base towards the apex. Left ventricular systolic function calculated by QGS was in the lower limit of  normal at 51% with severe inferior and lateral hypokinesis. This is a high risk study  Carotid Doppler [12/26/2015]: No hemodynamically significant arterial disease in the internal carotid artery bilaterally. Minimal heteregenous plaquie  bilateral carotid bulb. Antegrade right vertebral artery flow. Antegrade left vertebral artery flow.  Peripheral arteriogram 11/23/2013: Bilateral aortoiliac bifurcation and iliac artery showed mild disease. Bilateral SFA shows cauliflower-like severe diffuse mixed calcific stenosis, bilateral severe diffuse disease of the anterior and peroneal arteries. Findings not consistent with bilateral hip claudication.   Lower extremity arterial duplex 12/13/2015: No hemodynamically significant stenoses are identified in the right lower extremity arterial system. Moderate velocity increase at the left distal superficial femoral artery suggests > 50% stenosis. Diffuse mixed plaque noted both lower extremities. This exam reveals mildly decreased perfusion of the right lower extremity with ABI 0.96 and moderately decreased perfusion of the left lower extremity with ABI 0.69 noted at the post tibial artery level. When compared to ABI done on 07/04/2011, ABI was normal bilaterally.  Assessment     ICD-10-CM   1. Atherosclerosis of native coronary artery of native heart without angina pectoris  I25.10   2. History of coronary artery bypass graft x 3 CABG 01/02/2016: LIMA to LAD, SVG to OM1, SVG to PDA by Dahlia Byes, MD  Z95.1   3. Dyspnea on exertion  R06.09 PCV ECHOCARDIOGRAM COMPLETE    PCV MYOCARDIAL PERFUSION WITH LEXISCAN  4. Claudication in peripheral vascular disease (HCC)  I73.9   5. Essential hypertension  I10   6. Type 2 diabetes, controlled, with peripheral circulatory disorder (HCC)  E11.51     EKG 02/05/2018: Normal sinus rhythm at rate of 70 bpm, normal axis, poor R-wave progression, cannot exclude anteroseptal infarct old. Nonspecific T abnormality I & aVL. No significant change from EKG 04/02/2017.  Recommendations:   Jorge Adams is a Caucasian male with coronary artery disease status post CABG 3 in 12/2015, known peripheral artery disease (angiography in 2015 revealing bilateral SFA  cauliflower-like severe diffuse mixed atheresclerotic lesions with bilateral severe disease in the anterior and peroneal arteries), no significant disease was noted in the iliac vessels. He has type II diagnosed mellitus controlled, mixed hyperlipidemia and hypertension here for 1 year follow-up.  Patient is overall doing well, he is having worsening low back and hip pain, particularly on the right side. He is having MRI performed today as ordered by his Neurosurgeon. Concerned that his symptoms could be related to claudication given his history with PAD. Will follow up on MRI results, if unyielding, will further evaluate with LE arterial duplex. He is on appropriate medical therapy for PAD.  He is on appropriate medical therapy. I suspect his dyspnea is related to inactivity and changes in his exercise regimen; however, he had dyspnea prior to his CABG. Feel that this should be evaluated. Will schedule for lexiscan nuclear stress test and also echocardiogram to exclude any structural abnormalities. Blood pressure and hyperlipidemia are well controlled. He is working to get diabetes back better controlled. He has done well with overall maintaining weight loss, but I have encouraged him to work to lose another 10 lbs. I will see him back after his test for follow up.    *I have discussed this case with Dr. Einar Gip and he personally examined the patient and participated in formulating the plan.*   Miquel Dunn, MSN, APRN, FNP-C [facility]. [place] Office: [phone] Fax: [phone]

## 2019-03-15 DIAGNOSIS — M545 Low back pain: Secondary | ICD-10-CM | POA: Diagnosis not present

## 2019-03-16 ENCOUNTER — Other Ambulatory Visit: Payer: Self-pay

## 2019-03-16 ENCOUNTER — Encounter: Payer: Self-pay | Admitting: Family Medicine

## 2019-03-16 ENCOUNTER — Ambulatory Visit (INDEPENDENT_AMBULATORY_CARE_PROVIDER_SITE_OTHER): Payer: Medicare HMO

## 2019-03-16 DIAGNOSIS — R0609 Other forms of dyspnea: Secondary | ICD-10-CM

## 2019-03-16 DIAGNOSIS — R0600 Dyspnea, unspecified: Secondary | ICD-10-CM

## 2019-03-18 ENCOUNTER — Ambulatory Visit (INDEPENDENT_AMBULATORY_CARE_PROVIDER_SITE_OTHER): Payer: Medicare HMO

## 2019-03-18 DIAGNOSIS — Z23 Encounter for immunization: Secondary | ICD-10-CM | POA: Diagnosis not present

## 2019-03-19 ENCOUNTER — Other Ambulatory Visit: Payer: Self-pay | Admitting: Family Medicine

## 2019-03-22 ENCOUNTER — Ambulatory Visit (INDEPENDENT_AMBULATORY_CARE_PROVIDER_SITE_OTHER): Payer: Medicare HMO

## 2019-03-22 ENCOUNTER — Other Ambulatory Visit: Payer: Self-pay

## 2019-03-22 DIAGNOSIS — R0600 Dyspnea, unspecified: Secondary | ICD-10-CM

## 2019-03-22 DIAGNOSIS — I2510 Atherosclerotic heart disease of native coronary artery without angina pectoris: Secondary | ICD-10-CM

## 2019-03-22 DIAGNOSIS — R0609 Other forms of dyspnea: Secondary | ICD-10-CM | POA: Diagnosis not present

## 2019-03-30 ENCOUNTER — Other Ambulatory Visit: Payer: Self-pay

## 2019-03-30 DIAGNOSIS — I209 Angina pectoris, unspecified: Secondary | ICD-10-CM

## 2019-03-30 MED ORDER — NITROGLYCERIN 0.4 MG SL SUBL: 0.4000 mg | SUBLINGUAL_TABLET | SUBLINGUAL | 2 refills | Status: DC | PRN

## 2019-03-31 ENCOUNTER — Other Ambulatory Visit: Payer: Self-pay | Admitting: Cardiology

## 2019-04-02 ENCOUNTER — Other Ambulatory Visit: Payer: Self-pay | Admitting: Cardiology

## 2019-04-02 DIAGNOSIS — E782 Mixed hyperlipidemia: Secondary | ICD-10-CM

## 2019-04-05 ENCOUNTER — Other Ambulatory Visit: Payer: Self-pay

## 2019-04-05 ENCOUNTER — Ambulatory Visit (INDEPENDENT_AMBULATORY_CARE_PROVIDER_SITE_OTHER): Payer: Medicare HMO | Admitting: Cardiology

## 2019-04-05 ENCOUNTER — Encounter: Payer: Self-pay | Admitting: Cardiology

## 2019-04-05 VITALS — BP 132/75 | HR 75 | Temp 96.9°F | Ht 70.0 in | Wt 224.3 lb

## 2019-04-05 DIAGNOSIS — I10 Essential (primary) hypertension: Secondary | ICD-10-CM | POA: Diagnosis not present

## 2019-04-05 DIAGNOSIS — I739 Peripheral vascular disease, unspecified: Secondary | ICD-10-CM | POA: Diagnosis not present

## 2019-04-05 DIAGNOSIS — I2510 Atherosclerotic heart disease of native coronary artery without angina pectoris: Secondary | ICD-10-CM

## 2019-04-05 DIAGNOSIS — E7800 Pure hypercholesterolemia, unspecified: Secondary | ICD-10-CM

## 2019-04-05 MED ORDER — ROSUVASTATIN CALCIUM 20 MG PO TABS: 20.0000 mg | ORAL_TABLET | Freq: Every day | ORAL | 3 refills | Status: DC

## 2019-04-05 NOTE — Progress Notes (Signed)
Primary Physician/Referring:  [name], MD  Patient ID: Jorge Adams, male    DOB: February 15, 1951, 68 y.o.   MRN: [id number]  Chief Complaint  Patient presents with  . Coronary Artery Disease  . Shortness of Breath  . Results    nuc, echo  . Follow-up  . Claudication   HPI:    HPI: Jorge Adams  is a 68 y.o. coronary artery disease status post CABG 3 in 12/2015, known peripheral artery disease (angiography in 2015 revealing bilateral SFA cauliflower-like severe diffuse mixed atheresclerotic lesions with bilateral severe disease in the anterior and peroneal arteries), no significant disease was noted in the iliac vessels. He has type II diagnosed mellitus controlled, mixed hyperlipidemia and hypertension here for 6 month follow-up.  He has not been able to exercise much since the pandemic. Weight is up and also HgbA1c since last visit. He reports right leg sciatica and hip pain and is having MRI later today for further evaluation.   He denies any chest pain, but has noticed over the last few months, shortness of breath with walking uphill. He does report; however, visiting the [place] last weekend and was able to do a lot of walking. He does have to stop for breaks, but mostly due to his hips. No PND, orthopnea, or leg edema.  States hypertension and hyperlipidemia have been well controlled.   Past Medical History:  Diagnosis Date  . Arthritis   . ASHD (arteriosclerotic heart disease)   . Atherosclerosis of native arteries of the extremities with intermittent claudication   . Cataract   . Coronary atherosclerosis of native coronary artery   . Diabetes mellitus    NIDDM  . Dyslipidemia   . ED (erectile dysfunction)   . Full dentures   . Glaucoma associated with ocular disorder, mild stage    bilateral  . Hypertension    under control, has been on med. > 9 yr.  . Impotence of organic origin   . Meniscus tear 05/2012   right knee  . Myocardial infarction ([place])  10/2002  . Stented coronary artery    Past Surgical History:  Procedure Laterality Date  . ANTERIOR CERVICAL DECOMP/DISCECTOMY FUSION N/A 05/07/2018   Procedure: ANTERIOR CERVICAL DECOMPRESSION/DISCECTOMY FUSION CERVICAL FIVE - CERVICAL SIX;  Surgeon: Consuella Lose, MD;  [place] ;  Service: Neurosurgery;  Laterality: N/A;  ANTERIOR CERVICAL DECOMPRESSION/DISCECTOMY FUSION CERVICAL FIVE - CERVICAL SIX  . CARDIAC CATHETERIZATION  10/23/2002   had 1 stent placed then-- by Dr. Melvern Banker  . CARDIAC CATHETERIZATION N/A 12/21/2015   Procedure: Left Heart Cath and Coronary Angiography;  Surgeon: Adrian Prows, MD;  Location: [place] [facility];  Service: Cardiovascular;  Laterality: N/A;  . CATARACT EXTRACTION Right 05/2017  . CATARACT EXTRACTION Left 07/2017  . COLONOSCOPY    . CORONARY ARTERY BYPASS GRAFT N/A 01/02/2016   Procedure: CORONARY ARTERY BYPASS GRAFTING (CABG) TIMES 3 USING LEFT INTERNAL MAMMARY ARTERY AND RIGHT SAPHENOUS LEG VEIN HARVESTED ENDOSCOPICALLY;  Surgeon: Ivin Poot, MD;  Location: [place];  Service: Open Heart Surgery;  Laterality: N/A;  . INSERTION OF MESH N/A 05/06/2016   Procedure: INSERTION OF MESH;  Surgeon: Georganna Skeans, MD;  Location: [place];  Service: General;  Laterality: N/A;  . KNEE ARTHROSCOPY WITH MEDIAL MENISECTOMY  06/04/2012   Procedure: KNEE ARTHROSCOPY WITH MEDIAL MENISECTOMY;  Surgeon: Ninetta Lights, MD;  Location: [place];  Service: Orthopedics;  Laterality: Right;  RIGHT SCOPE MEDIAL MENISCECTOMY, CHONDROPLASTY, EXCISION LOOSE  BODY  . LOWER EXTREMITY ANGIOGRAM N/A 11/23/2013   Procedure: LOWER EXTREMITY ANGIOGRAM;  Surgeon: Laverda Page, MD;  Location: [facility];  Service: Cardiovascular;  Laterality: N/A;  . NECK SURGERY N/A    02/19/2018  . PERIPHERAL VASCULAR CATHETERIZATION N/A 12/21/2015   Procedure: Abdominal Aortogram;  Surgeon: Adrian Prows, MD;  Location: [place] [facility];  Service: Cardiovascular;  Laterality: N/A;  .  PERIPHERAL VASCULAR CATHETERIZATION N/A 12/21/2015   Procedure: Abdominal Aortogram w/Lower Extremity;  Surgeon: Adrian Prows, MD;  Location: [place] [facility];  Service: Cardiovascular;  Laterality: N/A;  . TEE WITHOUT CARDIOVERSION N/A 01/02/2016   Procedure: TRANSESOPHAGEAL ECHOCARDIOGRAM (TEE);  Surgeon: Ivin Poot, MD;  Location: [place];  Service: Open Heart Surgery;  Laterality: N/A;  . UMBILICAL HERNIA REPAIR N/A 05/06/2016   Procedure: HERNIA REPAIR UMBILICAL ADULT;  Surgeon: Georganna Skeans, MD;  Location: [place];  Service: General;  Laterality: N/A;   Social History   Socioeconomic History  . Marital status: Married    Spouse name: Not on file  . Number of children: 2  . Years of education: Not on file  . Highest education level: Not on file  Occupational History  . Occupation: retired  Scientific laboratory technician  . Financial resource strain: Not on file  . Food insecurity    Worry: Not on file    Inability: Not on file  . Transportation needs    Medical: Not on file    Non-medical: Not on file  Tobacco Use  . Smoking status: Former Smoker    Packs/day: 2.50    Years: 35.00    Pack years: 87.50    Quit date: 12/02/2002    Years since quitting: 16.3  . Smokeless tobacco: Never Used  Substance and Sexual Activity  . Alcohol use: Yes    Comment: rare  . Drug use: No  . Sexual activity: Not on file  Lifestyle  . Physical activity    Days per week: Not on file    Minutes per session: Not on file  . Stress: Not on file  Relationships  . Social Herbalist on phone: Not on file    Gets together: Not on file    Attends religious service: Not on file    Active member of club or organization: Not on file    Attends meetings of clubs or organizations: Not on file    Relationship status: Not on file  . Intimate partner violence    Fear of current or ex partner: Not on file    Emotionally abused: Not on file    Physically abused: Not on file    Forced sexual activity: Not  on file  Other Topics Concern  . 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    [employer], [employer], [employer] fan   ROS  Review of Systems  Constitution: Positive for malaise/fatigue. Negative for decreased appetite, weight gain and weight loss.  Eyes: Negative for visual disturbance.  Cardiovascular: Positive for claudication (hips bilateral chronic) and dyspnea on exertion. Negative for chest pain, leg swelling, orthopnea, palpitations and syncope.  Respiratory: Negative for hemoptysis and wheezing.   Endocrine: Negative for cold intolerance and heat intolerance.  Hematologic/Lymphatic: Does not bruise/bleed easily.  Skin: Negative for nail changes.  Musculoskeletal: Positive for back pain and joint pain (  bilateral hip left worse and bilateral knee right worse). Negative for muscle weakness and myalgias.  Gastrointestinal: Negative for abdominal pain, change in bowel habit, nausea and vomiting.  Neurological: Negative for difficulty with concentration, dizziness, focal weakness and headaches.  Psychiatric/Behavioral: Negative for altered mental status and suicidal ideas.  All other systems reviewed and are negative.  Objective  Blood pressure 132/75, pulse 75, temperature (!) 96.9 F (36.1 C), height _0  (1.778 m), weight 224 lb 4.8 oz (101.7 kg), SpO2 96 %. Body mass index is 32.18 kg/m.   Physical Exam  Constitutional: He is oriented to person, place, and time. Vital signs are normal.  Moderately built and mildly obese with mostly truncal obesity. He is in no acute distress.   HENT:  Head: Normocephalic and atraumatic.  Neck: Normal range of motion.  Cardiovascular: Normal rate, regular rhythm, normal heart sounds and intact distal pulses. Midsystolic click: .  Pulses:      Femoral pulses are 1+ on the right side and 1+ on the left side.       Popliteal pulses are 1+ on the right side and 0 on the left side.       Dorsalis pedis pulses are 0 on the right side and 0 on the left side.       Posterior tibial pulses are 0 on the right side and 0 on the left side.  Loss of hair bilateral shin noted. Capillary refill normal. No edema. No JVD  Pulmonary/Chest: Effort normal and breath sounds normal. No accessory muscle usage. No respiratory distress.  CABG/sternotomy scar  Abdominal: Soft. Bowel sounds are normal.  Hernia repair scar noted  Musculoskeletal: Normal range of motion.  Neurological: He is alert and oriented to person, place, and time.  Skin: Skin is warm and dry.  Vitals reviewed.  Radiology: No results found.  Laboratory examination:   Recent Labs    04/10/18 1157 04/28/18 1040 05/08/18 0527 09/03/18 0841  NA 143 138 136 141  K 4.8 3.6 3.9 5.0  CL 104 106 100 104  CO2 _1 GLUCOSE 126* 172* 171* 134*  BUN 8 8 7* 10  CREATININE 0.87 0.76 0.90 0.85  CALCIUM 9.6 9.1 9.4 9.6  GFRNONAA 89 >60 >60  --   GFRAA 103 >60 >60  --    CMP Latest Ref Rng & Units 09/03/2018 05/08/2018 04/28/2018  Glucose 70 - 99 mg/dL 134(H) 171(H) 172(H)  BUN 6 - 23 mg/dL 10 7(L) 8  Creatinine 0.40 - 1.50 mg/dL 0.85 0.90 0.76  Sodium 135 - 145 mEq/L 141 136 138  Potassium 3.5 - 5.1 mEq/L 5.0 3.9 3.6  Chloride 96 - 112 mEq/L 104 100 106  CO2 19 - 32 mEq/L _2 Calcium 8.4 - 10.5 mg/dL 9.6 9.4 9.1  Total Protein 6.0 - 8.3 g/dL 7.0 - -  Total Bilirubin 0.2 - 1.2 mg/dL 0.6 - -  Alkaline Phos 39 - 117 U/L 52 - -  AST 0 - 37 U/L 16 - -  ALT 0 - 53 U/L 18 - -   CBC Latest Ref Rng & Units 05/08/2018 04/28/2018 03/13/2018  WBC 4.0 - 10.5 K/uL 10.8(H) 6.2 5.5  Hemoglobin 13.0 - 17.0 g/dL 14.2 14.1 14.5  Hematocrit 39.0 - 52.0 % 42.5 42.4 42.3  Platelets 150 - 400 K/uL 166 152 153.0   Lipid Panel     Component Value Date/Time   CHOL 139 09/03/2018 0841   CHOL 155 05/02/2017  TRIG 114.0 09/03/2018 0841   TRIG 300  05/02/2017   HDL 47.40 09/03/2018 0841   HDL 40 05/02/2017   CHOLHDL 3 09/03/2018 0841   VLDL 22.8 09/03/2018 0841   LDLCALC 69 09/03/2018 0841   LDLCALC 55 05/02/2017   LDLDIRECT 70.0 11/29/2015 0847   HEMOGLOBIN A1C Lab Results  Component Value Date   HGBA1C 7.3 (A) 01/11/2019   MPG 116.89 04/28/2018   TSH No results for input(s): TSH in the last 8760 hours. Medications   Current Outpatient Medications  Medication Instructions  . amLODipine-benazepril (LOTREL) 5-20 MG capsule 1 capsule, Oral, Daily  . aspirin EC 81 mg, Oral, Daily  . blood glucose meter kit and supplies One touch Verio meter.  Use daily as needed to check sugar.  DM2.  E11.9. Dispense 1 meter.  . brimonidine (ALPHAGAN P) 0.1 % SOLN 1 drop, Both Eyes, 2 times daily  . cilostazol (PLETAL) 100 MG tablet TAKE 1 TABLET BY MOUTH TWICE A DAY  . dorzolamide-timolol (COSOPT) 22.3-6.8 MG/ML ophthalmic solution 1 drop, Both Eyes, 2 times daily  . fenofibrate (TRICOR) 145 MG tablet TAKE 1 TABLET BY MOUTH EVERY EVENING  . glucose blood (ONETOUCH VERIO) test strip USE 1 STRIP ONCE A DAY.  Diagnosis:  E11.9  Non insulin dependent.  . Lancets (ONETOUCH DELICA PLUS [id number]) MISC USE TO TEST BLOOD SUGAR ONCE DAILY AS NEEDED  . metFORMIN (GLUCOPHAGE) 500 mg, Oral, 2 times daily  . metoprolol tartrate (LOPRESSOR) 25 mg, Oral, 2 times daily  . nitroGLYCERIN (NITROSTAT) 0.4 mg, Sublingual, Every 5 min PRN  . omega-3 acid ethyl esters (LOVAZA) 1 g, Oral, 2 times daily  . ONETOUCH VERIO test strip USE 1 STRIP ONCE A DAY  . phenylephrine (NEO-SYNEPHRINE) 1 % nasal spray 1 drop, Each Nare, At bedtime PRN  . rosuvastatin (CRESTOR) 20 mg, Oral, Daily  . TRAVATAN Z 0.004 % SOLN ophthalmic solution 1 drop, Both Eyes, Daily at bedtime    Cardiac Studies:   Peripheral arteriogram 11/23/2013: Bilateral aortoiliac bifurcation and iliac artery showed mild disease. Bilateral SFA shows cauliflower-like severe diffuse mixed calcific stenosis,  bilateral severe diffuse disease of the anterior and peroneal arteries. Findings not consistent with bilateral hip claudication.  Coronary angiogram 12/21/2015:  1. Normal LV systolic function, EF 51-02%. 2. Severe triple-vessel coronary artery disease. Long segment occlusion, CTO of proximal mid distal RCA,large branches collateralized by Type III collaterals from the distal LAD Continuing through PDA branch, several septal perforators giving collaterals to the distal RCA. 3. Proximal LAD with severe diffuse calcification and tandem 80-85% stenosis. Mid to distal LAD has mild disease, tortuous. Moderate-sized diagonals with mild disease. 4. Circumflex coronary artery is a moderate-sized vessel. Mid-segment with a 40% stenosis at the origin of a large OM1 which has 3 secondary branches. This OM1 has a old stent which is widely patent in the proximal segment however the ostium of the OM 1 has a high-grade 90% stenosis.  Peripheral arteriogram 12/21/2015: Abdominal aortogram with limited bifemoral arteriogram Revealing mild disease in the abdominal aorta and in the aortoiliac bifurcation and proximal common femoral artery. Proximal SFA is widely patent bilaterally. Mild calcification is evident.  Carotid Doppler [12/26/2015]: No hemodynamically significant arterial disease in the internal carotid artery bilaterally. Minimal heteregenous plaquie bilateral carotid bulb. Antegrade right vertebral artery flow. Antegrade left vertebral artery flow.  Lower extremity arterial duplex 12/13/2015: No hemodynamically significant stenoses are identified in the right lower extremity arterial system. Moderate velocity increase at the left distal superficial femoral  artery suggests > 50% stenosis. Diffuse mixed plaque noted both lower extremities. This exam reveals mildly decreased perfusion of the right lower extremity with ABI 0.96 and moderately decreased perfusion of the left lower extremity with ABI 0.69  noted at the post tibial artery level. When compared to ABI done on 07/04/2011, ABI was normal bilaterally.  Echocardiogram 03/16/2019: Left ventricle cavity is normal in size. Moderate concentric hypertrophy of the left ventricle. Normal LV systolic function with EF 55%. Abnormal septal wall motion due to post-operative coronary artery bypass graft. Doppler evidence of grade I (impaired) diastolic dysfunction, normal LAP. Calculated EF 55%. Right atrial cavity is mildly dilated. Right ventricle cavity is mildly dilated. Normal right ventricular function. Mild (Grade I) mitral regurgitation. Trace tricuspid regurgitation. IVC is dilated with blunted respiratory response. Estimated RA pressure 10-15 mmHg.  Compared to previous study in 2017, elevated right atrial pressure is new.   Long Neck Stress Test 03/22/2019: 1. Stress EKG is non-diagnostic, as this is pharmacological stress test. Myocardial perfusion imaging is normal. Left ventricular ejection fraction is  66% with normal wall motion. Low risk study. 2. Compared to the study done on 12/08/2015, inferior ischemia not present.  Assessment     ICD-10-CM   1. Atherosclerosis of native coronary artery of native heart without angina pectoris  I25.10 EKG 12-Lead  2. Claudication in peripheral vascular disease (HCC)  I73.9   3. Essential hypertension  I10     EKG 04/05/2019: Normal sinus rhythm at rate of 69 bpm, normal axis, IRBBB, poor R-wave progression, cannot exclude anteroseptal infarct old.  Low-voltage complexes.   EKG 02/05/2018: Normal sinus rhythm at rate of 70 bpm, normal axis, poor R-wave progression, cannot exclude anteroseptal infarct old. Nonspecific T abnormality I & aVL. No significant change from EKG 04/02/2017.  Recommendations:   Jorge Adams is a Caucasian male with coronary artery disease status post CABG 3 in 12/2015, known peripheral artery disease (angiography in 2015 revealing bilateral SFA  cauliflower-like severe diffuse mixed atheresclerotic lesions with bilateral severe disease in the anterior and peroneal arteries), no significant disease was noted in the iliac vessels, also angiogram in 2017 revealing no iliac disease). He has type II diagnosed mellitus controlled, mixed hyperlipidemia and hypertension here for 6 week follow-up.   On his last office visit he complained of worsening dyspnea, he underwent echocardiogram and  nuclear stress test and presents for follow-up.  I reviewed the results of the echocardiogram, his EF is preserved, does have diastolic dysfunction.  Nuclear stress test fortunately does not reveal any significant ischemia.  I reassured him.  Advised him to continue physical activity and try to get some weight off.  I also reviewed his prior angiograms the lower extremity, his pain is present mostly in the back and right him and thigh area and after review of angiograms from 2015 and also 2018, there was no significant iliac artery stenosis, hence I do not suspect vascular claudication.    He did have MRI of the back, does have degenerative disc disease, I suspect his pseudo-claudication symptoms are multifactorial with DJD and spinal stenosis. He has limited activity and hence is not having calf claudication. He does have PAD involving bilateral SFA.   No changes in the EKG, blood pressure is well controlled, lipids are also well controlled.  Continue present medications, I'll see him back in one year.  Adrian Prows, MD, [facility] 04/05/2019, 9:20 PM [facility]. [place] Pager: [phone] Office: [phone] If no answer Cell [phone]

## 2019-04-07 DIAGNOSIS — R69 Illness, unspecified: Secondary | ICD-10-CM | POA: Diagnosis not present

## 2019-04-07 DIAGNOSIS — H401131 Primary open-angle glaucoma, bilateral, mild stage: Secondary | ICD-10-CM | POA: Diagnosis not present

## 2019-04-26 ENCOUNTER — Other Ambulatory Visit: Payer: Medicare HMO

## 2019-04-30 ENCOUNTER — Ambulatory Visit: Payer: Medicare HMO | Admitting: Cardiology

## 2019-05-13 ENCOUNTER — Other Ambulatory Visit: Payer: Self-pay | Admitting: Cardiology

## 2019-05-13 ENCOUNTER — Ambulatory Visit: Payer: Medicare HMO | Admitting: Family Medicine

## 2019-05-20 ENCOUNTER — Other Ambulatory Visit: Payer: Self-pay

## 2019-05-20 ENCOUNTER — Ambulatory Visit (INDEPENDENT_AMBULATORY_CARE_PROVIDER_SITE_OTHER): Payer: Medicare HMO | Admitting: Family Medicine

## 2019-05-20 ENCOUNTER — Encounter: Payer: Self-pay | Admitting: Family Medicine

## 2019-05-20 VITALS — BP 130/72 | HR 83 | Temp 97.1°F | Ht 70.0 in | Wt 223.5 lb

## 2019-05-20 DIAGNOSIS — E1159 Type 2 diabetes mellitus with other circulatory complications: Secondary | ICD-10-CM

## 2019-05-20 DIAGNOSIS — E119 Type 2 diabetes mellitus without complications: Secondary | ICD-10-CM | POA: Diagnosis not present

## 2019-05-20 DIAGNOSIS — Z23 Encounter for immunization: Secondary | ICD-10-CM

## 2019-05-20 LAB — POCT GLYCOSYLATED HEMOGLOBIN (HGB A1C): Hemoglobin A1C: 7.7 % — AB (ref 4.0–5.6)

## 2019-05-20 NOTE — Patient Instructions (Addendum)
Don't change your meds for now.  Flu shot today.  Thanks for getting that done.  Recheck with labs ahead of a yearly physical in ~08/2019.   Take care.  Glad to see you.

## 2019-05-20 NOTE — Progress Notes (Signed)
Diabetes:  Using medications without difficulties: yes Hypoglycemic episodes: no Hyperglycemic episodes: no Feet problems: no Blood Sugars averaging: usually ~150s eye exam within last year: pending for next week.    A1c done at OV.  D/w pt.  7.7.   Diet and exercise d/w pt.    Meds, vitals, and allergies reviewed.   ROS: Per HPI unless specifically indicated in ROS section   GEN: nad, alert and oriented HEENT: ncat NECK: supple w/o LA CV: rrr. PULM: ctab, no inc wob ABD: soft, +bs EXT: no edema SKIN: no acute rash  Diabetic foot exam: Normal inspection No skin breakdown Callus noted at R 1st MT head Intact DP pulses Normal sensation to light touch and monofilament Nails normal

## 2019-05-24 DIAGNOSIS — H401131 Primary open-angle glaucoma, bilateral, mild stage: Secondary | ICD-10-CM | POA: Diagnosis not present

## 2019-05-24 NOTE — Assessment & Plan Note (Signed)
eye exam within last year: pending for next week.    A1c done at OV.  D/w pt.  7.7.   Diet and exercise d/w pt.   No change in meds at this point.  If he can get his A1c lower with diet and exercise then he may not need to change his medications.  Recheck labs with upcoming physical, see after visit summary.  He agrees to plan.

## 2019-06-07 ENCOUNTER — Other Ambulatory Visit: Payer: Self-pay | Admitting: Family Medicine

## 2019-07-05 ENCOUNTER — Other Ambulatory Visit: Payer: Self-pay | Admitting: Family Medicine

## 2019-07-05 DIAGNOSIS — R69 Illness, unspecified: Secondary | ICD-10-CM | POA: Diagnosis not present

## 2019-07-08 ENCOUNTER — Other Ambulatory Visit: Payer: Self-pay | Admitting: Cardiology

## 2019-07-27 DIAGNOSIS — H401131 Primary open-angle glaucoma, bilateral, mild stage: Secondary | ICD-10-CM | POA: Diagnosis not present

## 2019-08-06 ENCOUNTER — Other Ambulatory Visit: Payer: Self-pay

## 2019-08-06 DIAGNOSIS — R69 Illness, unspecified: Secondary | ICD-10-CM | POA: Diagnosis not present

## 2019-08-06 MED ORDER — ONETOUCH VERIO W/DEVICE KIT: PACK | 3 refills | Status: DC

## 2019-08-06 MED ORDER — ONETOUCH VERIO VI STRP: ORAL_STRIP | 3 refills | Status: DC

## 2019-08-22 ENCOUNTER — Ambulatory Visit: Payer: Medicare HMO

## 2019-08-28 ENCOUNTER — Ambulatory Visit: Payer: Medicare HMO

## 2019-08-29 ENCOUNTER — Other Ambulatory Visit: Payer: Self-pay | Admitting: Family Medicine

## 2019-08-29 ENCOUNTER — Ambulatory Visit: Payer: Medicare HMO | Attending: Internal Medicine

## 2019-08-29 DIAGNOSIS — Z23 Encounter for immunization: Secondary | ICD-10-CM

## 2019-08-29 DIAGNOSIS — E119 Type 2 diabetes mellitus without complications: Secondary | ICD-10-CM

## 2019-08-29 NOTE — Progress Notes (Signed)
   Covid-19 Vaccination Clinic  Name:  Jorge Adams    MRN: [id number] DOB: 22-Feb-1951  08/29/2019  Jorge Adams was observed post Covid-19 immunization for 15 minutes without incidence. He was provided with Vaccine Information Sheet and instruction to access the V-Safe system.   Jorge Adams was instructed to call 911 with any severe reactions post vaccine: Marland Kitchen Difficulty breathing  . Swelling of your face and throat  . A fast heartbeat  . A bad rash all over your body  . Dizziness and weakness    Immunizations Administered    Name Date Dose VIS Date Route   Pfizer COVID-19 Vaccine 08/29/2019  8:28 AM 0.3 mL 07/02/2019 Intramuscular   Manufacturer: [place]   Lot: [id number]   [place]: [id number]

## 2019-09-02 ENCOUNTER — Other Ambulatory Visit: Payer: Self-pay | Admitting: Cardiology

## 2019-09-02 DIAGNOSIS — E782 Mixed hyperlipidemia: Secondary | ICD-10-CM

## 2019-09-09 ENCOUNTER — Ambulatory Visit (INDEPENDENT_AMBULATORY_CARE_PROVIDER_SITE_OTHER): Payer: Medicare HMO

## 2019-09-09 ENCOUNTER — Other Ambulatory Visit: Payer: Medicare HMO

## 2019-09-09 ENCOUNTER — Other Ambulatory Visit: Payer: Self-pay

## 2019-09-09 ENCOUNTER — Ambulatory Visit: Payer: Medicare HMO

## 2019-09-09 VITALS — Wt 220.0 lb

## 2019-09-09 DIAGNOSIS — Z0000 Encounter for general adult medical examination without abnormal findings: Secondary | ICD-10-CM

## 2019-09-09 NOTE — Progress Notes (Signed)
Subjective:   WOODROW DRAB is a 69 y.o. male who presents for Medicare Annual/Subsequent preventive examination.  Review of Systems: N/A   This visit is being conducted through telemedicine via telephone at the nurse health advisor's home address due to the COVID-19 pandemic. This patient has given me verbal consent via doximity to conduct this visit, patient states they are participating from their home address. Patient and myself are on the telephone call. There is no referral for this visit. Some vital signs may be absent or patient reported.    Patient identification: identified by name, DOB, and current address   Cardiac Risk Factors include: advanced age (>35mn, >>37women);diabetes mellitus;dyslipidemia;hypertension;male gender     Objective:    Vitals: Wt 220 lb (99.8 kg)   BMI 31.57 kg/m   Body mass index is 31.57 kg/m.  Advanced Directives 09/09/2019 09/03/2018 05/07/2018 04/28/2018 08/25/2017 05/01/2016 02/20/2016  Does Patient Have a Medical Advance Directive? _0  No No  Does patient want to make changes to medical advance directive? No - Patient declined - - - - - -  Would patient like information on creating a medical advance directive? - No - Patient declined No - Patient declined No - Patient declined Yes (MAU/Ambulatory/Procedural Areas - Information given) No - patient declined information No - patient declined information    Tobacco Social History   Tobacco Use  Smoking Status Former Smoker  . Packs/day: 2.50  . Years: 35.00  . Pack years: 87.50  . Quit date: 12/02/2002  . Years since quitting: 16.7  Smokeless Tobacco Never Used     Counseling given: Not Answered   Clinical Intake:  Pre-visit preparation completed: Yes  Pain : No/denies pain     Nutritional Risks: None Diabetes: Yes CBG done?: No Did pt. bring in CBG monitor from home?: No  How often do you need to have someone help you when you read instructions, pamphlets, or  other written materials from your doctor or pharmacy?: 1 - Never What is the last grade level you completed in school?: 12th  Interpreter Needed?: No  Information entered by :: CJohnson, LPN  Past Medical History:  Diagnosis Date  . Arthritis   . ASHD (arteriosclerotic heart disease)   . Atherosclerosis of native arteries of the extremities with intermittent claudication   . Cataract   . Coronary atherosclerosis of native coronary artery   . Diabetes mellitus    NIDDM  . Dyslipidemia   . ED (erectile dysfunction)   . Full dentures   . Glaucoma associated with ocular disorder, mild stage    bilateral  . Hypertension    under control, has been on med. > 9 yr.  . Impotence of organic origin   . Meniscus tear 05/2012   right knee  . Myocardial infarction ([place] 10/2002  . Stented coronary artery    Past Surgical History:  Procedure Laterality Date  . ANTERIOR CERVICAL DECOMP/DISCECTOMY FUSION N/A 05/07/2018   Procedure: ANTERIOR CERVICAL DECOMPRESSION/DISCECTOMY FUSION CERVICAL FIVE - CERVICAL SIX;  Surgeon: NConsuella Lose MD;  [place] [facility]  Service: Neurosurgery;  Laterality: N/A;  ANTERIOR CERVICAL DECOMPRESSION/DISCECTOMY FUSION CERVICAL FIVE - CERVICAL SIX  . CARDIAC CATHETERIZATION  10/23/2002   had 1 stent placed then-- by Dr. GMelvern Banker . CARDIAC CATHETERIZATION N/A 12/21/2015   Procedure: Left Heart Cath and Coronary Angiography;  Surgeon: JAdrian Prows MD;  [facility];  Service: Cardiovascular;  Laterality: N/A;  . CATARACT EXTRACTION Right 05/2017  .  CATARACT EXTRACTION Left 07/2017  . COLONOSCOPY    . CORONARY ARTERY BYPASS GRAFT N/A 01/02/2016   Procedure: CORONARY ARTERY BYPASS GRAFTING (CABG) TIMES 3 USING LEFT INTERNAL MAMMARY ARTERY AND RIGHT SAPHENOUS LEG VEIN HARVESTED ENDOSCOPICALLY;  Surgeon: Ivin Poot, MD;  Location: [place];  Service: Open Heart Surgery;  Laterality: N/A;  . INSERTION OF MESH N/A 05/06/2016   Procedure: INSERTION OF MESH;   Surgeon: Georganna Skeans, MD;  Location: [place];  Service: General;  Laterality: N/A;  . KNEE ARTHROSCOPY WITH MEDIAL MENISECTOMY  06/04/2012   Procedure: KNEE ARTHROSCOPY WITH MEDIAL MENISECTOMY;  Surgeon: Ninetta Lights, MD;  Location: [place];  Service: Orthopedics;  Laterality: Right;  RIGHT SCOPE MEDIAL MENISCECTOMY, CHONDROPLASTY, EXCISION LOOSE BODY  . LOWER EXTREMITY ANGIOGRAM N/A 11/23/2013   Procedure: LOWER EXTREMITY ANGIOGRAM;  Surgeon: Laverda Page, MD;  Location: [facility] CATH LAB;  Service: Cardiovascular;  Laterality: N/A;  . NECK SURGERY N/A    02/19/2018  . PERIPHERAL VASCULAR CATHETERIZATION N/A 12/21/2015   Procedure: Abdominal Aortogram;  Surgeon: Adrian Prows, MD;  Location: [place] [facility];  Service: Cardiovascular;  Laterality: N/A;  . PERIPHERAL VASCULAR CATHETERIZATION N/A 12/21/2015   Procedure: Abdominal Aortogram w/Lower Extremity;  Surgeon: Adrian Prows, MD;  Location: [place] [facility];  Service: Cardiovascular;  Laterality: N/A;  . TEE WITHOUT CARDIOVERSION N/A 01/02/2016   Procedure: TRANSESOPHAGEAL ECHOCARDIOGRAM (TEE);  Surgeon: Ivin Poot, MD;  Location: [place];  Service: Open Heart Surgery;  Laterality: N/A;  . UMBILICAL HERNIA REPAIR N/A 05/06/2016   Procedure: HERNIA REPAIR UMBILICAL ADULT;  Surgeon: Georganna Skeans, MD;  Location: [facility] OR;  Service: General;  Laterality: N/A;   Family History  Problem Relation Age of Onset  . Bone cancer Mother   . Pneumonia Father   . Diabetes Brother   . Heart disease Brother        Pacemaker  . COPD Brother   . Colon cancer Neg Hx   . Prostate cancer Neg Hx    Social History   Socioeconomic History  . Marital status: Married    Spouse name: Not on file  . Number of children: 2  . Years of education: Not on file  . Highest education level: Not on file  Occupational History  . Occupation: retired  Tobacco Use  . Smoking status: Former Smoker    Packs/day: 2.50    Years: 35.00    Pack years:  87.50    Quit date: 12/02/2002    Years since quitting: 16.7  . Smokeless tobacco: Never Used  Substance and Sexual Activity  . Alcohol use: Yes    Comment: rare  . Drug use: No  . Sexual activity: Not on file  Other Topics Concern  . 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    [employer]   Social Determinants of [employer] Strain: Low Risk   . Difficulty of Paying Living Expenses: Not hard at all  Food Insecurity: No Food Insecurity  . Worried About Charity fundraiser in the Last Year: Never true  . Ran Out of Food in the Last Year: Never true  Transportation Needs: No Transportation Needs  . Lack of Transportation (Medical): No  . Lack of Transportation (Non-Medical): No  Physical Activity: Sufficiently Active  .  Days of Exercise per Week: 7 days  . Minutes of Exercise per Session: 30 min  Stress: No Stress Concern Present  . Feeling of Stress : Not at all  Social Connections:   . Frequency of Communication with Friends and Family: Not on file  . Frequency of Social Gatherings with Friends and Family: Not on file  . Attends Religious Services: Not on file  . Active Member of Clubs or Organizations: Not on file  . Attends Archivist Meetings: Not on file  . Marital Status: Not on file    Outpatient Encounter Medications as of 09/09/2019  Medication Sig  . amLODipine-benazepril (LOTREL) 5-20 MG capsule TAKE 1 CAPSULE BY MOUTH EVERY DAY  . aspirin EC 81 MG tablet Take 1 tablet (81 mg total) by mouth daily.  . blood glucose meter kit and supplies One touch Verio meter.  Use daily as needed to check sugar.  DM2.  E11.9. Dispense 1 meter.  . Blood Glucose Monitoring Suppl (ONETOUCH VERIO) w/Device KIT Check sugar once daily. DX: E11.9  . brimonidine (ALPHAGAN P) 0.1 % SOLN Place 1 drop into both  eyes 2 (two) times daily.  . cilostazol (PLETAL) 100 MG tablet TAKE 1 TABLET BY MOUTH TWICE A DAY  . dorzolamide-timolol (COSOPT) 22.3-6.8 MG/ML ophthalmic solution Place 1 drop into both eyes 2 (two) times daily.  . fenofibrate (TRICOR) 145 MG tablet TAKE 1 TABLET BY MOUTH EVERY DAY IN THE EVENING  . glucose blood (ONETOUCH VERIO) test strip check sugar once daily No diagnosis found. DX: E11.9  . Lancets (ONETOUCH DELICA PLUS [id number]) MISC USE TO TEST BLOOD SUGAR ONCE DAILY AS NEEDED  . latanoprost (XALATAN) 0.005 % ophthalmic solution 1 drop at bedtime.  . metFORMIN (GLUCOPHAGE) 1000 MG tablet Take 0.5 tablets (500 mg total) by mouth 2 (two) times daily.  . metFORMIN (GLUCOPHAGE) 500 MG tablet TAKE 1 TABLET (500 MG TOTAL) BY MOUTH 2 (TWO) TIMES DAILY WITH A MEAL.  . metoprolol tartrate (LOPRESSOR) 25 MG tablet Take 25 mg by mouth 2 (two) times daily.  . nitroGLYCERIN (NITROSTAT) 0.4 MG SL tablet Place 1 tablet (0.4 mg total) under the tongue every 5 (five) minutes as needed for chest pain.  Marland Kitchen omega-3 acid ethyl esters (LOVAZA) 1 g capsule TAKE 1 CAPSULE (1 G TOTAL) BY MOUTH 2 (TWO) TIMES DAILY.  Marland Kitchen ONETOUCH VERIO test strip USE 1 STRIP ONCE A DAY. DIAGNOSIS: E11.9 NON INSULIN DEPENDENT.  Marland Kitchen phenylephrine (NEO-SYNEPHRINE) 1 % nasal spray Place 1 drop into both nostrils at bedtime as needed for congestion.  . RHOPRESSA 0.02 % SOLN Apply 1 drop to eye at bedtime.  . rosuvastatin (CRESTOR) 20 MG tablet Take 1 tablet (20 mg total) by mouth daily.  . TRAVATAN Z 0.004 % SOLN ophthalmic solution Place 1 drop into both eyes at bedtime.   No facility-administered encounter medications on file as of 09/09/2019.    Activities of Daily Living In your present state of health, do you have any difficulty performing the following activities: 09/09/2019  Hearing? N  Vision? N  Difficulty concentrating or making decisions? N  Walking or climbing stairs? N  Dressing or bathing? N  Doing errands, shopping? N   Preparing Food and eating ? N  Using the Toilet? N  In the past six months, have you accidently leaked urine? N  Do you have problems with loss of bowel control? N  Managing your Medications? N  Managing your Finances? N  Housekeeping or managing your Housekeeping?  N  Some recent data might be hidden    Patient Care Team: [name], MD as PCP - General (Family Medicine) Adrian Prows, MD as Consulting Physician (Cardiology) [place], [place], [place] as Referring Physician (Optometry) Warden Fillers, MD as Consulting Physician (Ophthalmology)   Assessment:   This is a routine wellness examination for [place].  Exercise Activities and Dietary recommendations    Goals    . Increase physical activity     Starting 09/03/2018, I will continue to exercise for at least 120 minutes 3 days per week.     . Patient Stated     09/09/2019, I will continue walking everyday for about 30 minutes.        Fall Risk Fall Risk  09/09/2019 09/03/2018 08/25/2017 02/20/2016  Falls in the past year? 0 1 No No  Number falls in past yr: 0 0 - -  Injury with Fall? 0 1 - -  Risk for fall due to : Medication side effect - - -  Follow up Falls evaluation completed;Falls prevention discussed - - -   Is the patient's home free of loose throw rugs in walkways, pet beds, electrical cords, etc?   yes      Grab bars in the bathroom? no      Handrails on the stairs?   yes      Adequate lighting?   yes  Timed Get Up and Go Performed: N/A  Depression Screen PHQ 2/9 Scores 09/09/2019 09/03/2018 08/25/2017 04/30/2016  PHQ - 2 Score 0 0 0 0  PHQ- 9 Score 0 0 0 0    Cognitive Function MMSE - Mini Mental State Exam 09/09/2019 09/03/2018 08/25/2017  Orientation to time _0 Orientation to Place _1 Registration _2 Attention/ Calculation 5 0 0  Recall _3 Recall-comments - - unable to recall 1 of 3 words  Language- name 2 objects - 0 0  Language- repeat _4 Language- follow 3 step command - 3 3   Language- read & follow direction - 0 0  Write a sentence - 0 0  Copy design - 0 0  Total score - 20 19  Mini Cog  Mini-Cog screen was completed. Maximum score is 22. A value of 0 denotes this part of the MMSE was not completed or the patient failed this part of the Mini-Cog screening.       Immunization History  Administered Date(s) Administered  . Fluad Quad(high Dose 65+) 05/20/2019  . Influenza Split 05/01/2012, 04/21/2013  . Influenza,inj,Quad PF,6+ Mos 06/06/2015, 06/24/2016, 04/25/2017, 06/09/2018  . Influenza-Unspecified 04/27/2014  . PFIZER SARS-COV-2 Vaccination 08/29/2019  . Pneumococcal Conjugate-13 12/02/2014  . Pneumococcal Polysaccharide-23 09/10/2010, 12/04/2015  . Tdap 09/10/2010  . Zoster 09/10/2010  . Zoster Recombinat (Shingrix) 03/18/2019, 07/05/2019    Qualifies for Shingles Vaccine: Completed series  Screening Tests Health Maintenance  Topic Date Due  . COLONOSCOPY  09/08/2020 (Originally 10/12/2015)  . HEMOGLOBIN A1C  11/18/2019  . FOOT EXAM  05/19/2020  . OPHTHALMOLOGY EXAM  07/31/2020  . TETANUS/TDAP  09/10/2020  . INFLUENZA VACCINE  Completed  . Hepatitis C Screening  Completed  . PNA vac Low Risk Adult  Completed   Cancer Screenings: Lung: Low Dose CT Chest recommended if Age 53-80 years, 30 pack-year currently smoking OR have quit w/in 15years. Patient does not qualify. Colorectal: due, Patient states he will schedule this when he has time.   Additional Screenings:  Hepatitis  C Screening: 11/29/2015      Plan:   Patient will continue to walk daily for 30 minutes.   I have personally reviewed and noted the following in the patient's chart:   . Medical and social history . Use of alcohol, tobacco or illicit drugs  . Current medications and supplements . Functional ability and status . Nutritional status . Physical activity . Advanced directives . List of other physicians . Hospitalizations, surgeries, and ER visits in previous 12  months . Vitals . Screenings to include cognitive, depression, and falls . Referrals and appointments  In addition, I have reviewed and discussed with patient certain preventive protocols, quality metrics, and best practice recommendations. A written personalized care plan for preventive services as well as general preventive health recommendations were provided to patient.     Andrez Grime, LPN  08/31/3126

## 2019-09-09 NOTE — Patient Instructions (Signed)
Jorge Adams , Thank you for taking time to come for your Medicare Wellness Visit. I appreciate your ongoing commitment to your health goals. Please review the following plan we discussed and let me know if I can assist you in the future.   Screening recommendations/referrals: Colonoscopy: Patient will schedule will be gets time Recommended yearly ophthalmology/optometry visit for glaucoma screening and checkup Recommended yearly dental visit for hygiene and checkup  Vaccinations: Influenza vaccine: Up to date, completed 05/20/2019 Pneumococcal vaccine: Completed series Tdap vaccine: Up to date, completed 09/10/2010 Shingles vaccine: Completed series    Advanced directives: Advance directive discussed with you today. Even though you declined this today please call our office should you change your mind and we can give you the proper paperwork for you to fill out.  Conditions/risks identified: diabetes, hypertension, hyperlipidemia  Next appointment: 09/17/2019 @ 8:15 am   Preventive Care 65 Years and Older, Male Preventive care refers to lifestyle choices and visits with your health care provider that can promote health and wellness. What does preventive care include?  A yearly physical exam. This is also called an annual well check.  Dental exams once or twice a year.  Routine eye exams. Ask your health care provider how often you should have your eyes checked.  Personal lifestyle choices, including:  Daily care of your teeth and gums.  Regular physical activity.  Eating a healthy diet.  Avoiding tobacco and drug use.  Limiting alcohol use.  Practicing safe sex.  Taking low doses of aspirin every day.  Taking vitamin and mineral supplements as recommended by your health care provider. What happens during an annual well check? The services and screenings done by your health care provider during your annual well check will depend on your age, overall health, lifestyle  risk factors, and family history of disease. Counseling  Your health care provider may ask you questions about your:  Alcohol use.  Tobacco use.  Drug use.  Emotional well-being.  Home and relationship well-being.  Sexual activity.  Eating habits.  History of falls.  Memory and ability to understand (cognition).  Work and work Statistician. Screening  You may have the following tests or measurements:  Height, weight, and BMI.  Blood pressure.  Lipid and cholesterol levels. These may be checked every 5 years, or more frequently if you are over 76 years old.  Skin check.  Lung cancer screening. You may have this screening every year starting at age 63 if you have a 30-pack-year history of smoking and currently smoke or have quit within the past 15 years.  Fecal occult blood test (FOBT) of the stool. You may have this test every year starting at age 21.  Flexible sigmoidoscopy or colonoscopy. You may have a sigmoidoscopy every 5 years or a colonoscopy every 10 years starting at age 47.  Prostate cancer screening. Recommendations will vary depending on your family history and other risks.  Hepatitis C blood test.  Hepatitis B blood test.  Sexually transmitted disease (STD) testing.  Diabetes screening. This is done by checking your blood sugar (glucose) after you have not eaten for a while (fasting). You may have this done every 1-3 years.  Abdominal aortic aneurysm (AAA) screening. You may need this if you are a current or former smoker.  Osteoporosis. You may be screened starting at age 11 if you are at high risk. Talk with your health care provider about your test results, treatment options, and if necessary, the need for more tests. Vaccines  Your health care provider may recommend certain vaccines, such as:  Influenza vaccine. This is recommended every year.  Tetanus, diphtheria, and acellular pertussis (Tdap, Td) vaccine. You may need a Td booster every 10  years.  Zoster vaccine. You may need this after age 67.  Pneumococcal 13-valent conjugate (PCV13) vaccine. One dose is recommended after age 41.  Pneumococcal polysaccharide (PPSV23) vaccine. One dose is recommended after age 37. Talk to your health care provider about which screenings and vaccines you need and how often you need them. This information is not intended to replace advice given to you by your health care provider. Make sure you discuss any questions you have with your health care provider. Document Released: 08/04/2015 Document Revised: 03/27/2016 Document Reviewed: 05/09/2015 Elsevier Interactive Patient Education  2017 [place] Prevention in the Home Falls can cause injuries. They can happen to people of all ages. There are many things you can do to make your home safe and to help prevent falls. What can I do on the outside of my home?  Regularly fix the edges of walkways and driveways and fix any cracks.  Remove anything that might make you trip as you walk through a door, such as a raised step or threshold.  Trim any bushes or trees on the path to your home.  Use bright outdoor lighting.  Clear any walking paths of anything that might make someone trip, such as rocks or tools.  Regularly check to see if handrails are loose or broken. Make sure that both sides of any steps have handrails.  Any raised decks and porches should have guardrails on the edges.  Have any leaves, snow, or ice cleared regularly.  Use sand or salt on walking paths during winter.  Clean up any spills in your garage right away. This includes oil or grease spills. What can I do in the bathroom?  Use night lights.  Install grab bars by the toilet and in the tub and shower. Do not use towel bars as grab bars.  Use non-skid mats or decals in the tub or shower.  If you need to sit down in the shower, use a plastic, non-slip stool.  Keep the floor dry. Clean up any water that  spills on the floor as soon as it happens.  Remove soap buildup in the tub or shower regularly.  Attach bath mats securely with double-sided non-slip rug tape.  Do not have throw rugs and other things on the floor that can make you trip. What can I do in the bedroom?  Use night lights.  Make sure that you have a light by your bed that is easy to reach.  Do not use any sheets or blankets that are too big for your bed. They should not hang down onto the floor.  Have a firm chair that has side arms. You can use this for support while you get dressed.  Do not have throw rugs and other things on the floor that can make you trip. What can I do in the kitchen?  Clean up any spills right away.  Avoid walking on wet floors.  Keep items that you use a lot in easy-to-reach places.  If you need to reach something above you, use a strong step stool that has a grab bar.  Keep electrical cords out of the way.  Do not use floor polish or wax that makes floors slippery. If you must use wax, use non-skid floor wax.  Do  not have throw rugs and other things on the floor that can make you trip. What can I do with my stairs?  Do not leave any items on the stairs.  Make sure that there are handrails on both sides of the stairs and use them. Fix handrails that are broken or loose. Make sure that handrails are as long as the stairways.  Check any carpeting to make sure that it is firmly attached to the stairs. Fix any carpet that is loose or worn.  Avoid having throw rugs at the top or bottom of the stairs. If you do have throw rugs, attach them to the floor with carpet tape.  Make sure that you have a light switch at the top of the stairs and the bottom of the stairs. If you do not have them, ask someone to add them for you. What else can I do to help prevent falls?  Wear shoes that:  Do not have high heels.  Have rubber bottoms.  Are comfortable and fit you well.  Are closed at the  toe. Do not wear sandals.  If you use a stepladder:  Make sure that it is fully opened. Do not climb a closed stepladder.  Make sure that both sides of the stepladder are locked into place.  Ask someone to hold it for you, if possible.  Clearly mark and make sure that you can see:  Any grab bars or handrails.  First and last steps.  Where the edge of each step is.  Use tools that help you move around (mobility aids) if they are needed. These include:  Canes.  Walkers.  Scooters.  Crutches.  Turn on the lights when you go into a dark area. Replace any light bulbs as soon as they burn out.  Set up your furniture so you have a clear path. Avoid moving your furniture around.  If any of your floors are uneven, fix them.  If there are any pets around you, be aware of where they are.  Review your medicines with your doctor. Some medicines can make you feel dizzy. This can increase your chance of falling. Ask your doctor what other things that you can do to help prevent falls. This information is not intended to replace advice given to you by your health care provider. Make sure you discuss any questions you have with your health care provider. Document Released: 05/04/2009 Document Revised: 12/14/2015 Document Reviewed: 08/12/2014 Elsevier Interactive Patient Education  2017 [employer].

## 2019-09-09 NOTE — Progress Notes (Signed)
PCP notes:  Health Maintenance: Colonoscopy- Patient will schedule when he gets time.   Abnormal Screenings: none   Patient concerns: none   Nurse concerns: none   Next PCP appt.: 09/17/2019 @ 8:15 am

## 2019-09-12 ENCOUNTER — Ambulatory Visit: Payer: Medicare HMO

## 2019-09-14 ENCOUNTER — Other Ambulatory Visit: Payer: Self-pay | Admitting: Family Medicine

## 2019-09-14 ENCOUNTER — Other Ambulatory Visit (INDEPENDENT_AMBULATORY_CARE_PROVIDER_SITE_OTHER): Payer: Medicare HMO

## 2019-09-14 ENCOUNTER — Other Ambulatory Visit: Payer: Self-pay

## 2019-09-14 DIAGNOSIS — E119 Type 2 diabetes mellitus without complications: Secondary | ICD-10-CM | POA: Diagnosis not present

## 2019-09-14 LAB — COMPREHENSIVE METABOLIC PANEL
ALT: 26 U/L (ref 0–53)
AST: 20 U/L (ref 0–37)
Albumin: 4.3 g/dL (ref 3.5–5.2)
Alkaline Phosphatase: 52 U/L (ref 39–117)
BUN: 10 mg/dL (ref 6–23)
CO2: 30 mEq/L (ref 19–32)
Calcium: 9.7 mg/dL (ref 8.4–10.5)
Chloride: 101 mEq/L (ref 96–112)
Creatinine, Ser: 0.82 mg/dL (ref 0.40–1.50)
GFR: 92.82 mL/min (ref >60.00)
Glucose, Bld: 154 mg/dL — ABNORMAL HIGH (ref 70–99)
Potassium: 4.7 mEq/L (ref 3.5–5.1)
Sodium: 136 mEq/L (ref 135–145)
Total Bilirubin: 0.6 mg/dL (ref 0.2–1.2)
Total Protein: 7.1 g/dL (ref 6.0–8.3)

## 2019-09-14 LAB — LIPID PANEL
Cholesterol: 135 mg/dL (ref 0–200)
HDL: 37.6 mg/dL — ABNORMAL LOW (ref >39.00)
LDL Cholesterol: 67 mg/dL (ref 0–99)
NonHDL: 97.36
Total CHOL/HDL Ratio: 4
Triglycerides: 152 mg/dL — ABNORMAL HIGH (ref 0.0–149.0)
VLDL: 30.4 mg/dL (ref 0.0–40.0)

## 2019-09-15 LAB — HEMOGLOBIN A1C: Hgb A1c MFr Bld: 7.7 % — ABNORMAL HIGH (ref 4.6–6.5)

## 2019-09-17 ENCOUNTER — Other Ambulatory Visit: Payer: Self-pay

## 2019-09-17 ENCOUNTER — Ambulatory Visit (INDEPENDENT_AMBULATORY_CARE_PROVIDER_SITE_OTHER): Payer: Medicare HMO | Admitting: Family Medicine

## 2019-09-17 ENCOUNTER — Encounter: Payer: Self-pay | Admitting: Family Medicine

## 2019-09-17 VITALS — BP 130/80 | HR 72 | Temp 95.6°F | Ht 70.0 in | Wt 223.4 lb

## 2019-09-17 DIAGNOSIS — I152 Hypertension secondary to endocrine disorders: Secondary | ICD-10-CM

## 2019-09-17 DIAGNOSIS — Z7189 Other specified counseling: Secondary | ICD-10-CM

## 2019-09-17 DIAGNOSIS — Z0000 Encounter for general adult medical examination without abnormal findings: Secondary | ICD-10-CM

## 2019-09-17 DIAGNOSIS — E785 Hyperlipidemia, unspecified: Secondary | ICD-10-CM | POA: Diagnosis not present

## 2019-09-17 DIAGNOSIS — E1159 Type 2 diabetes mellitus with other circulatory complications: Secondary | ICD-10-CM | POA: Diagnosis not present

## 2019-09-17 DIAGNOSIS — I10 Essential (primary) hypertension: Secondary | ICD-10-CM | POA: Diagnosis not present

## 2019-09-17 DIAGNOSIS — G992 Myelopathy in diseases classified elsewhere: Secondary | ICD-10-CM

## 2019-09-17 MED ORDER — METFORMIN HCL 500 MG PO TABS: 500.0000 mg | ORAL_TABLET | Freq: Two times a day (BID) | ORAL | 3 refills | Status: DC

## 2019-09-17 MED ORDER — ONETOUCH VERIO W/DEVICE KIT: PACK | 3 refills | Status: DC

## 2019-09-17 NOTE — Progress Notes (Signed)
This visit occurred during the SARS-CoV-2 public health emergency.  Safety protocols were in place, including screening questions prior to the visit, additional usage of staff PPE, and extensive cleaning of exam room while observing appropriate contact time as indicated for disinfecting solutions.  Prostate cancer screening and PSA options (with potential risks and benefits of testing vs not testing) were discussed along with recent recs/guidelines. He declined testing PSA at this point. No LUTS.  Living will d/w pt. Wife designated if patient were incapacitated.  Colonoscopy - due. D/w pt. He'll call about that.   Diet and exercise d/w pt.  Encouraged both.   Tetanus 2012 PNA up to date.  shingrix prev done.   Flu 2020 covid vaccine 2021, d/w pt.   He hasn't needed NTG, d/w pt.    Diabetes:  Using medications without difficulties: yes Hypoglycemic episodes: no  Hyperglycemic episodes: no Feet problems: no Blood Sugars averaging: usually 130-150s eye exam within last year: yes A1c similar to prev, d/w pt.   Hypertension:    Using medication without problems or lightheadedness: yes Chest pain with exertion:no Edema:no Short of breath:no  Elevated Cholesterol: Using medications without problems: yes Muscle aches: no  Diet compliance: encouraged.  Exercise: encouraged  His grip is improved from prior to surgery but not all the way back to 100% pre-injury level.    PMH and SH reviewed  Meds, vitals, and allergies reviewed.   ROS: Per HPI unless specifically indicated in ROS section   GEN: nad, alert and oriented HEENT: mucous membranes moist NECK: supple w/o LA CV: rrr. PULM: ctab, no inc wob ABD: soft, +bs EXT: no edema SKIN: no acute rash  Diabetic foot exam: Normal inspection No skin breakdown Small callus at plantar side of distal R 1st MT Mildly dec but clearly palpable DP pulses Normal sensation to light touch and monofilament Nails normal

## 2019-09-17 NOTE — Patient Instructions (Signed)
Recheck A1c in about 3-4 months at a visit.  You don't have to fast.  Increase metformin in the meantime.  Update me as needed.  Call GI when possible.  Take care.  Glad to see you.

## 2019-09-18 DIAGNOSIS — R69 Illness, unspecified: Secondary | ICD-10-CM | POA: Diagnosis not present

## 2019-09-19 NOTE — Assessment & Plan Note (Signed)
  His grip is improved from prior to surgery but not all the way back to 100% pre-injury level.   Continue as is for now.  He agrees.

## 2019-09-19 NOTE — Assessment & Plan Note (Signed)
Living will d/w pt.  Wife designated if patient were incapacitated.   ?

## 2019-09-19 NOTE — Assessment & Plan Note (Signed)
No change in meds.  Continue work on diet and exercise.  He agrees.

## 2019-09-19 NOTE — Assessment & Plan Note (Signed)
Prostate cancer screening and PSA options (with potential risks and benefits of testing vs not testing) were discussed along with recent recs/guidelines. He declined testing PSA at this point. No LUTS.  Living will d/w pt. Wife designated if patient were incapacitated.  Colonoscopy - due. D/w pt. He'll call about that.   Diet and exercise d/w pt.  Encouraged both.   Tetanus 2012 PNA up to date.  shingrix prev done.   Flu 2020 covid vaccine 2021, d/w pt.

## 2019-09-19 NOTE — Assessment & Plan Note (Signed)
A1c similar to prev, d/w pt.  Recheck A1c in about 3-4 months at a visit. Increase metformin in the meantime, routine cautions given to patient Update me as needed..  He agrees.

## 2019-09-21 ENCOUNTER — Ambulatory Visit: Payer: Medicare HMO | Attending: Internal Medicine

## 2019-09-21 DIAGNOSIS — Z23 Encounter for immunization: Secondary | ICD-10-CM | POA: Insufficient documentation

## 2019-09-21 NOTE — Progress Notes (Signed)
   Covid-19 Vaccination Clinic  Name:  NASHAUN HILLMER    MRN: [id number] DOB: 10-13-1950  09/21/2019  Mr. Dugar was observed post Covid-19 immunization for 15 minutes without incident. He was provided with Vaccine Information Sheet and instruction to access the V-Safe system.   Mr. Mulvehill was instructed to call 911 with any severe reactions post vaccine: Marland Kitchen Difficulty breathing  . Swelling of face and throat  . A fast heartbeat  . A bad rash all over body  . Dizziness and weakness   Immunizations Administered    Name Date Dose VIS Date Route   Pfizer COVID-19 Vaccine 09/21/2019 10:10 AM 0.3 mL 07/02/2019 Intramuscular   Manufacturer: [place]   Lot: [id number]   [place]: [id number]

## 2019-09-29 ENCOUNTER — Other Ambulatory Visit: Payer: Self-pay | Admitting: Cardiology

## 2019-09-29 DIAGNOSIS — I10 Essential (primary) hypertension: Secondary | ICD-10-CM

## 2019-09-30 ENCOUNTER — Other Ambulatory Visit: Payer: Self-pay | Admitting: Cardiology

## 2019-11-02 ENCOUNTER — Other Ambulatory Visit: Payer: Self-pay | Admitting: Cardiology

## 2019-11-05 DIAGNOSIS — R69 Illness, unspecified: Secondary | ICD-10-CM | POA: Diagnosis not present

## 2019-12-05 ENCOUNTER — Other Ambulatory Visit: Payer: Self-pay | Admitting: Cardiology

## 2019-12-05 DIAGNOSIS — E782 Mixed hyperlipidemia: Secondary | ICD-10-CM

## 2019-12-06 DIAGNOSIS — H401130 Primary open-angle glaucoma, bilateral, stage unspecified: Secondary | ICD-10-CM | POA: Diagnosis not present

## 2019-12-06 DIAGNOSIS — Z961 Presence of intraocular lens: Secondary | ICD-10-CM | POA: Diagnosis not present

## 2019-12-06 DIAGNOSIS — E119 Type 2 diabetes mellitus without complications: Secondary | ICD-10-CM | POA: Diagnosis not present

## 2019-12-16 ENCOUNTER — Encounter: Payer: Self-pay | Admitting: Family Medicine

## 2019-12-16 ENCOUNTER — Other Ambulatory Visit: Payer: Self-pay

## 2019-12-16 ENCOUNTER — Ambulatory Visit (INDEPENDENT_AMBULATORY_CARE_PROVIDER_SITE_OTHER): Payer: Medicare HMO | Admitting: Family Medicine

## 2019-12-16 VITALS — BP 138/86 | HR 70 | Temp 97.3°F | Ht 70.0 in | Wt 218.0 lb

## 2019-12-16 DIAGNOSIS — E1159 Type 2 diabetes mellitus with other circulatory complications: Secondary | ICD-10-CM

## 2019-12-16 LAB — POCT GLYCOSYLATED HEMOGLOBIN (HGB A1C): Hemoglobin A1C: 6.7 % — AB (ref 4.0–5.6)

## 2019-12-16 MED ORDER — METFORMIN HCL 500 MG PO TABS: 1000.0000 mg | ORAL_TABLET | Freq: Two times a day (BID) | ORAL | Status: DC

## 2019-12-16 NOTE — Assessment & Plan Note (Addendum)
Controlled.  Plan on recheck in about 5 months with A1c at the visit.  Update me as needed in the meantime.  He agrees.  I thanked him for his effort.  No change in meds at this point.

## 2019-12-16 NOTE — Patient Instructions (Signed)
Plan on recheck in about 5 months with A1c at the visit.  Update me as needed.  Take care.  Glad to see you.

## 2019-12-16 NOTE — Progress Notes (Signed)
This visit occurred during the SARS-CoV-2 public health emergency.  Safety protocols were in place, including screening questions prior to the visit, additional usage of staff PPE, and extensive cleaning of exam room while observing appropriate contact time as indicated for disinfecting solutions.  Diabetes:  Using medications without difficulties: metformin 1000mg  BID.  No ADE on med.  Hypoglycemic episodes: no Hyperglycemic episodes: no Feet problems: some tingling at baseline, no changed.   Blood Sugars averaging: ~140 at home.   eye exam within last year: done last week per patient report.  Pressure controlled per patient report.   A1c d/w pt at OV.  6.7.  Controlled.   He is working on diet.    Meds, vitals, and allergies reviewed.   ROS: Per HPI unless specifically indicated in ROS section   GEN: nad, alert and oriented HEENT: ncat NECK: supple w/o LA CV: rrr. PULM: ctab, no inc wob ABD: soft, +bs EXT: no edema SKIN: well perfused.

## 2020-02-06 DIAGNOSIS — R69 Illness, unspecified: Secondary | ICD-10-CM | POA: Diagnosis not present

## 2020-02-08 ENCOUNTER — Other Ambulatory Visit: Payer: Self-pay | Admitting: Cardiology

## 2020-02-08 DIAGNOSIS — E7800 Pure hypercholesterolemia, unspecified: Secondary | ICD-10-CM

## 2020-03-06 ENCOUNTER — Other Ambulatory Visit: Payer: Self-pay | Admitting: Cardiology

## 2020-03-08 DIAGNOSIS — H401131 Primary open-angle glaucoma, bilateral, mild stage: Secondary | ICD-10-CM | POA: Diagnosis not present

## 2020-03-15 ENCOUNTER — Other Ambulatory Visit: Payer: Self-pay | Admitting: Cardiology

## 2020-04-05 ENCOUNTER — Ambulatory Visit: Payer: Medicare HMO | Admitting: Cardiology

## 2020-04-10 ENCOUNTER — Other Ambulatory Visit: Payer: Self-pay | Admitting: Family Medicine

## 2020-04-17 ENCOUNTER — Other Ambulatory Visit: Payer: Self-pay

## 2020-04-17 ENCOUNTER — Encounter: Payer: Self-pay | Admitting: Cardiology

## 2020-04-17 ENCOUNTER — Ambulatory Visit: Payer: Medicare HMO | Admitting: Cardiology

## 2020-04-17 VITALS — BP 125/78 | HR 74 | Resp 16 | Ht 70.0 in | Wt 219.0 lb

## 2020-04-17 DIAGNOSIS — Z951 Presence of aortocoronary bypass graft: Secondary | ICD-10-CM | POA: Diagnosis not present

## 2020-04-17 DIAGNOSIS — I2510 Atherosclerotic heart disease of native coronary artery without angina pectoris: Secondary | ICD-10-CM | POA: Diagnosis not present

## 2020-04-17 DIAGNOSIS — I739 Peripheral vascular disease, unspecified: Secondary | ICD-10-CM | POA: Diagnosis not present

## 2020-04-17 DIAGNOSIS — I10 Essential (primary) hypertension: Secondary | ICD-10-CM

## 2020-04-17 NOTE — Progress Notes (Signed)
Primary Physician/Referring:  [name], MD  Patient ID: Jorge Adams, male    DOB: 1951/03/15, 69 y.o.   MRN: [id number]  Chief Complaint  Patient presents with  . Coronary Artery Disease  . Claudication  . Follow-up    1 year   HPI:    HPI: Jorge Adams  is a 69 y.o. coronary artery disease status post CABG 3 in 12/2015, known peripheral artery disease (angiography in 2015 revealing bilateral SFA cauliflower-like severe diffuse mixed atheresclerotic lesions with bilateral severe disease in the anterior and peroneal arteries), no significant disease was noted in the iliac vessels. He has type II diagnosed mellitus controlled, mixed hyperlipidemia and hypertensionp.  He has not been able to exercise much since the pandemic.  Fortunately he has maintained his weight loss that occurred after his CABG.  He has chronic dyspnea but no PND or orthopnea.  He does continue to have mild weakness in his left leg with activity but does not stop him from doing his activities of daily living or his routine activities.  Accompanied by his wife.  Past Medical History:  Diagnosis Date  . Arthritis   . ASHD (arteriosclerotic heart disease)   . Atherosclerosis of native arteries of the extremities with intermittent claudication   . Cataract   . Coronary atherosclerosis of native coronary artery   . Diabetes mellitus    NIDDM  . Dyslipidemia   . ED (erectile dysfunction)   . Full dentures   . Glaucoma associated with ocular disorder, mild stage    bilateral  . Hyperlipidemia   . Hypertension    under control, has been on med. > 9 yr.  . Impotence of organic origin   . Meniscus tear 05/2012   right knee  . Myocardial infarction ([facility]) 10/2002  . Stented coronary artery    Past Surgical History:  Procedure Laterality Date  . ANTERIOR CERVICAL DECOMP/DISCECTOMY FUSION N/A 05/07/2018   Procedure: ANTERIOR CERVICAL DECOMPRESSION/DISCECTOMY FUSION CERVICAL FIVE - CERVICAL SIX;   Surgeon: Consuella Lose, MD;  Location: [place];  Service: Neurosurgery;  Laterality: N/A;  ANTERIOR CERVICAL DECOMPRESSION/DISCECTOMY FUSION CERVICAL FIVE - CERVICAL SIX  . CARDIAC CATHETERIZATION  10/23/2002   had 1 stent placed then-- by Dr. Melvern Banker  . CARDIAC CATHETERIZATION N/A 12/21/2015   Procedure: Left Heart Cath and Coronary Angiography;  Surgeon: Adrian Prows, MD;  Location: [place] [facility];  Service: Cardiovascular;  Laterality: N/A;  . CATARACT EXTRACTION Right 05/2017  . CATARACT EXTRACTION Left 07/2017  . COLONOSCOPY    . CORONARY ARTERY BYPASS GRAFT N/A 01/02/2016   Procedure: CORONARY ARTERY BYPASS GRAFTING (CABG) TIMES 3 USING LEFT INTERNAL MAMMARY ARTERY AND RIGHT SAPHENOUS LEG VEIN HARVESTED ENDOSCOPICALLY;  Surgeon: Ivin Poot, MD;  Location: [place];  Service: Open Heart Surgery;  Laterality: N/A;  . INSERTION OF MESH N/A 05/06/2016   Procedure: INSERTION OF MESH;  Surgeon: Georganna Skeans, MD;  Location: [place];  Service: General;  Laterality: N/A;  . KNEE ARTHROSCOPY WITH MEDIAL MENISECTOMY  06/04/2012   Procedure: KNEE ARTHROSCOPY WITH MEDIAL MENISECTOMY;  Surgeon: Ninetta Lights, MD;  Location: [place];  Service: Orthopedics;  Laterality: Right;  RIGHT SCOPE MEDIAL MENISCECTOMY, CHONDROPLASTY, EXCISION LOOSE BODY  . LOWER EXTREMITY ANGIOGRAM N/A 11/23/2013   Procedure: LOWER EXTREMITY ANGIOGRAM;  Surgeon: Laverda Page, MD;  Location: [facility] C[facility];  Service: Cardiovascular;  Laterality: N/A;  . NECK SURGERY N/A    02/19/2018  . PERIPHERAL VASCULAR CATHETERIZATION  N/A 12/21/2015   Procedure: Abdominal Aortogram;  Surgeon: Adrian Prows, MD;  Location: [place] [facility];  Service: Cardiovascular;  Laterality: N/A;  . PERIPHERAL VASCULAR CATHETERIZATION N/A 12/21/2015   Procedure: Abdominal Aortogram w/Lower Extremity;  Surgeon: Adrian Prows, MD;  Location: [place] [facility];  Service: Cardiovascular;  Laterality: N/A;  . TEE WITHOUT CARDIOVERSION N/A 01/02/2016    Procedure: TRANSESOPHAGEAL ECHOCARDIOGRAM (TEE);  Surgeon: Ivin Poot, MD;  Location: [place];  Service: Open Heart Surgery;  Laterality: N/A;  . UMBILICAL HERNIA REPAIR N/A 05/06/2016   Procedure: HERNIA REPAIR UMBILICAL ADULT;  Surgeon: Georganna Skeans, MD;  Location: [place];  Service: General;  Laterality: N/A;   Social History   Tobacco Use  . Smoking status: Former Smoker    Packs/day: 2.50    Years: 35.00    Pack years: 87.50    Quit date: 12/02/2002    Years since quitting: 17.3  . Smokeless tobacco: Never Used  Substance Use Topics  . Alcohol use: Yes    Comment: rare  Marital Status: Married   ROS  Review of Systems  Cardiovascular: Positive for claudication and dyspnea on exertion. Negative for chest pain and leg swelling.  Musculoskeletal: Positive for back pain and joint pain.  Gastrointestinal: Negative for melena.   Objective  Blood pressure 125/78, pulse 74, resp. rate 16, height 5' 10"  (1.778 m), weight 219 lb (99.3 kg), SpO2 98 %. Body mass index is 31.42 kg/m.  Vitals with BMI 04/17/2020 12/16/2019 09/17/2019  Height 5' 10"  5' 10"  5' 10"   Weight 219 lbs 218 lbs 223 lbs 6 oz  BMI 31.42 20.94 70.96  Systolic 283 662 947  Diastolic 78 86 80  Pulse 74 70 72    Physical Exam Constitutional:      Comments: Moderately built and mildly obese with mostly truncal obesity. He is in no acute distress.   Cardiovascular:     Rate and Rhythm: Normal rate and regular rhythm.     Pulses: Intact distal pulses. Midsystolic click: .          Femoral pulses are 1+ on the right side and 1+ on the left side.      Popliteal pulses are 1+ on the right side and 0 on the left side.       Dorsalis pedis pulses are 0 on the right side and 0 on the left side.       Posterior tibial pulses are 0 on the right side and 0 on the left side.     Heart sounds: Normal heart sounds.     Comments: Loss of hair bilateral shin noted. Capillary refill normal. No edema. No JVD Pulmonary:      Effort: Pulmonary effort is normal. No accessory muscle usage or respiratory distress.     Breath sounds: Normal breath sounds.  Abdominal:     General: Bowel sounds are normal.     Palpations: Abdomen is soft.     Comments: Obese  Musculoskeletal:        General: Normal range of motion.  Neurological:     Mental Status: He is oriented to person, place, and time.    Radiology: No results found.  Laboratory examination:   Recent Labs    09/14/19 0822  NA 136  K 4.7  CL 101  CO2 30  GLUCOSE 154*  BUN 10  CREATININE 0.82  CALCIUM 9.7   CMP Latest Ref Rng & Units 09/14/2019 09/03/2018 05/08/2018  Glucose 70 -  99 mg/dL 154(H) 134(H) 171(H)  BUN 6 - 23 mg/dL 10 10 7(L)  Creatinine 0.40 - 1.50 mg/dL 0.82 0.85 0.90  Sodium 135 - 145 mEq/L 136 141 136  Potassium 3.5 - 5.1 mEq/L 4.7 5.0 3.9  Chloride 96 - 112 mEq/L 101 104 100  CO2 19 - 32 mEq/L 30 29 24   Calcium 8.4 - 10.5 mg/dL 9.7 9.6 9.4  Total Protein 6.0 - 8.3 g/dL 7.1 7.0 -  Total Bilirubin 0.2 - 1.2 mg/dL 0.6 0.6 -  Alkaline Phos 39 - 117 U/L 52 52 -  AST 0 - 37 U/L 20 16 -  ALT 0 - 53 U/L 26 18 -   CBC Latest Ref Rng & Units 05/08/2018 04/28/2018 03/13/2018  WBC 4.0 - 10.5 K/uL 10.8(H) 6.2 5.5  Hemoglobin 13.0 - 17.0 g/dL 14.2 14.1 14.5  Hematocrit 39 - 52 % 42.5 42.4 42.3  Platelets 150 - 400 K/uL 166 152 153.0   Lipid Panel Recent Labs    09/14/19 0822  CHOL 135  TRIG 152.0*  LDLCALC 67  VLDL 30.4  HDL 37.60*  CHOLHDL 4    HEMOGLOBIN A1C Lab Results  Component Value Date   HGBA1C 6.7 (A) 12/16/2019   MPG 116.89 04/28/2018   TSH No results for input(s): TSH in the last 8760 hours. Medications   Current Outpatient Medications on File Prior to Visit  Medication Sig Dispense Refill  . amLODipine-benazepril (LOTREL) 5-20 MG capsule TAKE 1 CAPSULE BY MOUTH EVERY DAY 30 capsule 0  . aspirin EC 81 MG tablet Take 1 tablet (81 mg total) by mouth daily. 30 tablet   . blood glucose meter kit and supplies  One touch Verio meter.  Use daily as needed to check sugar.  DM2.  E11.9. Dispense 1 meter. 1 each 0  . Blood Glucose Monitoring Suppl (ONETOUCH VERIO) w/Device KIT Check sugar once daily. DX: E11.9 1 kit 3  . brimonidine (ALPHAGAN P) 0.1 % SOLN Place 1 drop into both eyes 2 (two) times daily.    . cilostazol (PLETAL) 100 MG tablet TAKE 1 TABLET BY MOUTH TWICE A DAY 180 tablet 1  . dorzolamide-timolol (COSOPT) 22.3-6.8 MG/ML ophthalmic solution Place 1 drop into both eyes 2 (two) times daily.    . fenofibrate (TRICOR) 145 MG tablet TAKE 1 TABLET BY MOUTH EVERY DAY IN THE EVENING 90 tablet 1  . glucose blood (ONETOUCH VERIO) test strip check sugar once daily No diagnosis found. DX: E11.9 100 each 3  . Lancets (ONETOUCH DELICA PLUS EPPIRJ18A) MISC USE TO TEST BLOOD SUGAR ONCE DAILY AS NEEDED 100 each 1  . latanoprost (XALATAN) 0.005 % ophthalmic solution 1 drop at bedtime.    . metFORMIN (GLUCOPHAGE) 500 MG tablet Take 2 tablets (1,000 mg total) by mouth 2 (two) times daily with a meal.    . metoprolol tartrate (LOPRESSOR) 25 MG tablet TAKE 1 TABLET BY MOUTH TWICE A DAY 180 tablet 4  . nitroGLYCERIN (NITROSTAT) 0.4 MG SL tablet Place 1 tablet (0.4 mg total) under the tongue every 5 (five) minutes as needed for chest pain. 90 tablet 2  . omega-3 acid ethyl esters (LOVAZA) 1 g capsule TAKE 1 CAPSULE BY MOUTH TWICE A DAY 60 capsule 2  . ONETOUCH VERIO test strip USE 1 STRIP ONCE A DAY. DIAGNOSIS: E11.9 NON INSULIN DEPENDENT. 50 strip 7  . phenylephrine (NEO-SYNEPHRINE) 1 % nasal spray Place 1 drop into both nostrils at bedtime as needed for congestion.    . RHOPRESSA 0.02 %  SOLN Apply 1 drop to eye at bedtime.    . rosuvastatin (CRESTOR) 20 MG tablet TAKE 1 TABLET BY MOUTH EVERY DAY 90 tablet 3   No current facility-administered medications on file prior to visit.   Cardiac Studies:   Peripheral arteriogram 11/23/2013: Bilateral aortoiliac bifurcation and iliac artery showed mild disease. Bilateral  SFA shows cauliflower-like severe diffuse mixed calcific stenosis, bilateral severe diffuse disease of the anterior and peroneal arteries. Findings not consistent with bilateral hip claudication.  Coronary angiogram 12/21/2015:  1. Normal LV systolic function, EF 46-65%. 2. Severe triple-vessel coronary artery disease. Long segment occlusion, CTO of proximal mid distal RCA,large branches collateralized by Type III collaterals from the distal LAD Continuing through PDA branch, several septal perforators giving collaterals to the distal RCA. 3. Proximal LAD with severe diffuse calcification and tandem 80-85% stenosis. Mid to distal LAD has mild disease, tortuous. Moderate-sized diagonals with mild disease. 4. Circumflex coronary artery is a moderate-sized vessel. Mid-segment with a 40% stenosis at the origin of a large OM1 which has 3 secondary branches. This OM1 has a old stent which is widely patent in the proximal segment however the ostium of the OM 1 has a high-grade 90% stenosis.  Peripheral arteriogram 12/21/2015: Abdominal aortogram with limited bifemoral arteriogram Revealing mild disease in the abdominal aorta and in the aortoiliac bifurcation and proximal common femoral artery. Proximal SFA is widely patent bilaterally. Mild calcification is evident.  Carotid Doppler [12/26/2015]: No hemodynamically significant arterial disease in the internal carotid artery bilaterally. Minimal heteregenous plaquie bilateral carotid bulb. Antegrade right vertebral artery flow. Antegrade left vertebral artery flow.  Lower extremity arterial duplex 12/13/2015: No hemodynamically significant stenoses are identified in the right lower extremity arterial system. Moderate velocity increase at the left distal superficial femoral artery suggests > 50% stenosis. Diffuse mixed plaque noted both lower extremities. This exam reveals mildly decreased perfusion of the right lower extremity with ABI 0.96 and  moderately decreased perfusion of the left lower extremity with ABI 0.69 noted at the post tibial artery level. When compared to ABI done on 07/04/2011, ABI was normal bilaterally.  Echocardiogram 03/16/2019: Left ventricle cavity is normal in size. Moderate concentric hypertrophy of the left ventricle. Normal LV systolic function with EF 55%. Abnormal septal wall motion due to post-operative coronary artery bypass graft. Doppler evidence of grade I (impaired) diastolic dysfunction, normal LAP. Calculated EF 55%. Right atrial cavity is mildly dilated. Right ventricle cavity is mildly dilated. Normal right ventricular function. Mild (Grade I) mitral regurgitation. Trace tricuspid regurgitation. IVC is dilated with blunted respiratory response. Estimated RA pressure 10-15 mmHg.  Compared to previous study in 2017, elevated right atrial pressure is new.   [place] Stress Test 03/22/2019: 1. Stress EKG is non-diagnostic, as this is pharmacological stress test. Myocardial perfusion imaging is normal. Left ventricular ejection fraction is  66% with normal wall motion. Low risk study. 2. Compared to the study done on 12/08/2015, inferior ischemia not present.  EKG:     EKG 04/05/2019: Normal sinus rhythm at rate of 69 bpm, normal axis, IRBBB, poor R-wave progression, cannot exclude anteroseptal infarct old.  Low-voltage complexes.   EKG 02/05/2018: Normal sinus rhythm at rate of 70 bpm, normal axis, poor R-wave progression, cannot exclude anteroseptal infarct old. Nonspecific T abnormality I & aVL. No significant change from EKG 04/02/2017.  Assessment     ICD-10-CM   1. Atherosclerosis of native coronary artery of native heart without angina pectoris  I25.10   2. Claudication in peripheral  vascular disease ([place])  I73.9   3. History of coronary artery bypass graft x 3 CABG 01/02/2016: LIMA to LAD, SVG to OM1, SVG to PDA by Dahlia Byes, MD  Z95.1   4. Essential hypertension  I10       Recommendations:   Jorge Adams is a 69 y.o. Caucasian male with coronary artery disease status post CABG 3 in 12/2015, known peripheral artery disease (angiography in 2015 revealing bilateral SFA cauliflower-like severe diffuse mixed atheresclerotic lesions with bilateral severe disease in the anterior and peroneal arteries), no significant disease was noted in the iliac vessels, also angiogram in 2017 revealing no iliac disease). He has type II diagnosed mellitus controlled, mixed hyperlipidemia and hypertension here for annual visit.  He is presently doing well except for chronic dyspnea, there is no clinical evidence of heart failure, no PND or orthopnea.  Blood pressure is well controlled, I reviewed his lipids also well controlled.  His diabetes is also well controlled on Metformin.  No changes in the medications were done today.  Risk factor modification including continued weight loss which he has maintained since CABG was encouraged and also regular exercise was discussed with him and his wife.  I did not make any changes to his medications, he does indeed have peripheral arterial disease and has mild symptoms of claudication especially left leg, continue medical therapy for now.  I will see him back in a year.    Adrian Prows, MD, [facility] 04/17/2020, 5:24 PM Office: [phone]

## 2020-05-10 DIAGNOSIS — R69 Illness, unspecified: Secondary | ICD-10-CM | POA: Diagnosis not present

## 2020-05-11 ENCOUNTER — Other Ambulatory Visit: Payer: Self-pay

## 2020-05-11 ENCOUNTER — Encounter: Payer: Self-pay | Admitting: Family Medicine

## 2020-05-11 ENCOUNTER — Ambulatory Visit (INDEPENDENT_AMBULATORY_CARE_PROVIDER_SITE_OTHER): Payer: Medicare HMO | Admitting: Family Medicine

## 2020-05-11 VITALS — BP 112/72 | HR 79 | Temp 95.2°F | Ht 70.0 in | Wt 215.1 lb

## 2020-05-11 DIAGNOSIS — E1159 Type 2 diabetes mellitus with other circulatory complications: Secondary | ICD-10-CM | POA: Diagnosis not present

## 2020-05-11 DIAGNOSIS — Z23 Encounter for immunization: Secondary | ICD-10-CM | POA: Diagnosis not present

## 2020-05-11 DIAGNOSIS — E119 Type 2 diabetes mellitus without complications: Secondary | ICD-10-CM

## 2020-05-11 LAB — POCT GLYCOSYLATED HEMOGLOBIN (HGB A1C): Hemoglobin A1C: 6.6 % — AB (ref 4.0–5.6)

## 2020-05-11 NOTE — Patient Instructions (Addendum)
Don't change your meds for now.   Plan on recheck in 08/2020 at a yearly visit, labs ahead of time if possible.  Take care.  Glad to see you.

## 2020-05-11 NOTE — Progress Notes (Signed)
This visit occurred during the SARS-CoV-2 public health emergency.  Safety protocols were in place, including screening questions prior to the visit, additional usage of staff PPE, and extensive cleaning of exam room while observing appropriate contact time as indicated for disinfecting solutions.  Diabetes:  Using medications without difficulties: metformin BID.   Hypoglycemic episodes: no Hyperglycemic episodes: no Feet problems: some occ tingling, mild.   No skin breakdown.   Blood Sugars averaging: this AM was 90 eye exam within last year: yes A1c d/w pt at OV.  Stable at 6.6.  Meds, vitals, and allergies reviewed.  ROS: Per HPI unless specifically indicated in ROS section   GEN: nad, alert and oriented HEENT: ncat NECK: supple w/o LA CV: rrr. PULM: ctab, no inc wob EXT: no edema SKIN: well perfused.    Flu shot done at [facility].

## 2020-05-12 ENCOUNTER — Other Ambulatory Visit: Payer: Self-pay | Admitting: Cardiology

## 2020-05-14 NOTE — Assessment & Plan Note (Signed)
Continue Metformin for now. Plan on recheck in 08/2020 at a yearly visit, labs ahead of time if possible.  Continue work on diet and exercise.  He agrees.

## 2020-05-15 ENCOUNTER — Ambulatory Visit: Payer: Medicare HMO | Attending: Internal Medicine

## 2020-05-15 DIAGNOSIS — Z23 Encounter for immunization: Secondary | ICD-10-CM

## 2020-05-15 NOTE — Progress Notes (Signed)
   Covid-19 Vaccination Clinic  Name:  Jorge Adams    MRN: [id number] DOB: [birth year]  05/15/2020  Jorge Adams was observed post Covid-19 immunization for 15 minutes without incident. He was provided with Vaccine Information Sheet and instruction to access the V-Safe system.   Jorge Adams was instructed to call 911 with any severe reactions post vaccine: Marland Kitchen Difficulty breathing  . Swelling of face and throat  . A fast heartbeat  . A bad rash all over body  . Dizziness and weakness

## 2020-06-02 ENCOUNTER — Other Ambulatory Visit: Payer: Self-pay | Admitting: Cardiology

## 2020-06-02 DIAGNOSIS — E782 Mixed hyperlipidemia: Secondary | ICD-10-CM

## 2020-06-12 DIAGNOSIS — H401131 Primary open-angle glaucoma, bilateral, mild stage: Secondary | ICD-10-CM | POA: Diagnosis not present

## 2020-06-27 ENCOUNTER — Other Ambulatory Visit: Payer: Self-pay

## 2020-06-27 MED ORDER — OMEGA-3-ACID ETHYL ESTERS 1 G PO CAPS
1.0000 | ORAL_CAPSULE | Freq: Two times a day (BID) | ORAL | 1 refills | Status: DC
Start: 2020-06-27 — End: 2021-01-08

## 2020-07-18 ENCOUNTER — Telehealth: Payer: Self-pay | Admitting: Family Medicine

## 2020-07-18 NOTE — Chronic Care Management (AMB) (Signed)
  Chronic Care Management   Note  07/18/2020 Name: Jorge Adams MRN: [id number] DOB: Jan 01, 1951  Jorge Adams is a 69 y.o. year old male who is a primary care patient of [name], MD. I reached out to Luanna Salk by phone today in response to a referral sent by Jorge Adams's PCP, [name], MD.   Jorge Adams was given information about Chronic Care Management services today including:  1. CCM service includes personalized support from designated clinical staff supervised by his physician, including individualized plan of care and coordination with other care providers 2. 24/7 contact phone numbers for assistance for urgent and routine care needs. 3. Service will only be billed when office clinical staff spend 20 minutes or more in a month to coordinate care. 4. Only one practitioner may furnish and bill the service in a calendar month. 5. The patient may stop CCM services at any time (effective at the end of the month) by phone call to the office staff.   Patient agreed to services and verbal consent obtained.   Follow up plan:   [place]

## 2020-07-19 ENCOUNTER — Other Ambulatory Visit: Payer: Self-pay | Admitting: Cardiology

## 2020-07-21 DIAGNOSIS — Z20828 Contact with and (suspected) exposure to other viral communicable diseases: Secondary | ICD-10-CM | POA: Diagnosis not present

## 2020-08-06 ENCOUNTER — Other Ambulatory Visit: Payer: Self-pay | Admitting: Family Medicine

## 2020-08-14 ENCOUNTER — Telehealth: Payer: Self-pay

## 2020-08-14 NOTE — Telephone Encounter (Signed)
[facility] - Client Nonclinical Telephone Record  [facility] Client [facility] - Client Client Site [facility] Physician Renford Dills - MD Contact Type Call Who Is Calling Patient / Member / Family / Caregiver Caller Name Jorge Adams Caller Phone Number [phone] Patient Name Jorge Adams Patient DOB 07/26/1950 Call Type Message Only Information Provided Reason for Call Request for General Office Information Initial Comment Caller says that her husband has hi BP. His BP monitor quit working and wants to see if a new monitor can be called in to [facility] on Monday. Disp. Time Disposition Final User 08/12/2020 1:18:51 PM General Information Provided Yes Norton Blizzard Call Closed By: Norton Blizzard Transaction Date/Time: 08/12/2020 1:15:16 PM (ET)

## 2020-08-15 MED ORDER — SPHYGMOMANOMETER MISC: 0 refills | Status: DC

## 2020-08-15 NOTE — Telephone Encounter (Signed)
Patient aware rx was sent for new BP monitor

## 2020-08-15 NOTE — Telephone Encounter (Signed)
Sent. Thanks.   

## 2020-08-22 ENCOUNTER — Telehealth: Payer: Self-pay

## 2020-08-22 NOTE — Chronic Care Management (AMB) (Addendum)
Chronic Care Management Pharmacy Assistant   Name: Jorge Adams  MRN: [id number] DOB: August 14, 1950  Reason for Encounter: Initial questions for CCM visit 08/25/31  PCP : [name], MD  Allergies:  No Known Allergies  Medications: Outpatient Encounter Medications as of 08/22/2020  Medication Sig   amLODipine-benazepril (LOTREL) 5-20 MG capsule TAKE 1 CAPSULE BY MOUTH EVERY DAY   aspirin EC 81 MG tablet Take 1 tablet (81 mg total) by mouth daily.   blood glucose meter kit and supplies One touch Verio meter.  Use daily as needed to check sugar.  DM2.  E11.9. Dispense 1 meter.   Blood Glucose Monitoring Suppl (ONETOUCH VERIO) w/Device KIT Check sugar once daily. DX: E11.9   Blood Pressure Monitoring (SPHYGMOMANOMETER) MISC Use daily to check BP.  Dx I10   brimonidine (ALPHAGAN P) 0.1 % SOLN Place 1 drop into both eyes 2 (two) times daily.   cilostazol (PLETAL) 100 MG tablet TAKE 1 TABLET BY MOUTH TWICE A DAY   dorzolamide-timolol (COSOPT) 22.3-6.8 MG/ML ophthalmic solution Place 1 drop into both eyes 2 (two) times daily.   fenofibrate (TRICOR) 145 MG tablet TAKE 1 TABLET BY MOUTH EVERY DAY IN THE EVENING   glucose blood (ONETOUCH VERIO) test strip check sugar once daily No diagnosis found. DX: E11.9   Lancets (ONETOUCH DELICA PLUS [id number]) MISC USE TO TEST BLOOD SUGAR ONCE DAILY AS NEEDED   latanoprost (XALATAN) 0.005 % ophthalmic solution 1 drop at bedtime.   metFORMIN (GLUCOPHAGE) 500 MG tablet Take 2 tablets (1,000 mg total) by mouth 2 (two) times daily with a meal.   metoprolol tartrate (LOPRESSOR) 25 MG tablet TAKE 1 TABLET BY MOUTH TWICE A DAY   nitroGLYCERIN (NITROSTAT) 0.4 MG SL tablet Place 1 tablet (0.4 mg total) under the tongue every 5 (five) minutes as needed for chest pain.   omega-3 acid ethyl esters (LOVAZA) 1 g capsule Take 1 capsule (1 g total) by mouth 2 (two) times daily.   ONETOUCH VERIO test strip USE 1 STRIP ONCE A DAY. DIAGNOSIS: E11.9 NON INSULIN  DEPENDENT.   ONETOUCH VERIO test strip CHECK SUGAR ONCE DAILY. DX: E11.9   phenylephrine (NEO-SYNEPHRINE) 1 % nasal spray Place 1 drop into both nostrils at bedtime as needed for congestion.   RHOPRESSA 0.02 % SOLN Apply 1 drop to eye at bedtime.   rosuvastatin (CRESTOR) 20 MG tablet TAKE 1 TABLET BY MOUTH EVERY DAY   No facility-administered encounter medications on file as of 08/22/2020.    Current Diagnosis: Patient Active Problem List   Diagnosis Date Noted   Stenosis of cervical spine with myelopathy ([place]) 05/07/2018   Paresthesia 03/15/2018   Health care maintenance 09/04/2017   Snoring 38/46/6599   Umbilical hernia 35/70/1779   CAD (coronary artery disease) 01/02/2016   Angina pectoris ([place]) 12/18/2015   Routine general medical examination at a health care facility 12/07/2014   Advance care planning 12/07/2014   Atherosclerosis of native arteries of the extremities with intermittent claudication 12/05/2013   Erectile dysfunction associated with type 2 diabetes mellitus ([place]) 10/31/2013   Type 2 diabetes mellitus with vascular disease ([place]) 10/31/2013   Senile nuclear sclerosis 06/04/2013   Borderline glaucoma with ocular hypertension 06/04/2013   Amblyopia, unspecified 06/04/2013   ASHD (arteriosclerotic heart disease) 01/28/2011   Hypogonadism, male 01/28/2011   Hypertension associated with diabetes ([place]) 01/28/2011   Hyperlipidemia LDL goal <70 01/28/2011   Obesity (BMI 30-39.9) 01/28/2011    Have you seen any other providers  since your last visit with PCP? Yes 06/12/20- Dr. [name]- Optometry  Any changes in your medications or health? No  Any side effects from any medications? No  Do you have an symptoms or problems not managed by your medications? No  Any concerns about your health right now? No concerns at this time  Has your provider asked that you check blood pressure, blood sugar, or follow special diet at home? States he checks blood sugar and  pressure  Do you get any type of exercise on a regular basis? States he attempts to be active around the house. He was going to the [employer] but stopped once Covid-19 happened.   Can you think of a goal you would like to reach for your health? States he would like to lose weight. Notes he is about 211 right now and has been up to 220 in the past.   Do you have any problems getting your medications? One of his eye drops $60+ dollars.  Is there anything that you would like to discuss during the appointment? Not at this time.    Jorge Adams reminded to have all medications, supplements and any blood glucose and blood pressure readings available for review with Debbora Dus, Pharm. D, at their telephone visit on 08/24/20 at 11:00 AM .   Patient notes he no longer has [employer]Medicare he now has [employer].   Follow-Up:  Pharmacist Review  Debbora Dus, CPP notified  Margaretmary Dys, [place] [phone]  Total time spent for month: 30:00  I have reviewed the care management and care coordination activities outlined in this encounter and I am certifying that I agree with the content of this note. Will discuss with patient at appointment.  Debbora Dus, PharmD Clinical Pharmacist [facility] at [facility] [phone]

## 2020-08-22 NOTE — Chronic Care Management (AMB) (Unsigned)
Chronic Care Management Pharmacy  Name: Jorge Adams  MRN: [id number] DOB: 07/02/1951   Initial questions reviewed.   Chief Complaint/ HPI  Jorge Adams,  71 y.o. , male presents for their Initial CCM visit with the clinical pharmacist via telephone.  PCP : [name], MD  Their chronic conditions include: ASHD, HTN, type 2 DM, angina, CAD, hypogonadism, ED, HLD, obesity,   CCM consent 07/18/20  Patient denies health concerns.  Office Visits: 08/14/20 - Pt called to request new BP monitor  05/11/20 - PCP visit, diabetes, metformin BID, A1c stable 6.6%, BG this AM 90. Flu shot given. Annual visit due 08/2020  Consult Visit: 06/12/20 - Ophthalmology, glaucoma  04/17/20 - Cardiology, Charlyne Petrin - coronary artery disease status post CABG 3 in 12/2015, known peripheral artery disease - He is presently doing well except for chronic dyspnea, there is no clinical evidence of heart failure, no PND or orthopnea.  Blood pressure is well controlled, I reviewed his lipids also well controlled.  His diabetes is also well controlled on Metformin.  No changes in the medications were done today.  Risk factor modification including continued weight loss which he has maintained since CABG was encouraged and also regular exercise was discussed with him and his wife.  No Known Allergies  Medications: Outpatient Encounter Medications as of 08/24/2020  Medication Sig  . amLODipine-benazepril (LOTREL) 5-20 MG capsule TAKE 1 CAPSULE BY MOUTH EVERY DAY  . aspirin EC 81 MG tablet Take 1 tablet (81 mg total) by mouth daily.  . Blood Glucose Monitoring Suppl (ONETOUCH VERIO) w/Device KIT Check sugar once daily. DX: E11.9  . Blood Pressure Monitoring (SPHYGMOMANOMETER) MISC Use daily to check BP.  Dx I10  . brimonidine (ALPHAGAN P) 0.1 % SOLN Place 1 drop into both eyes 2 (two) times daily.  . cilostazol (PLETAL) 100 MG tablet TAKE 1 TABLET BY MOUTH TWICE A DAY  . dorzolamide-timolol  (COSOPT) 22.3-6.8 MG/ML ophthalmic solution Place 1 drop into both eyes 2 (two) times daily.  . fenofibrate (TRICOR) 145 MG tablet TAKE 1 TABLET BY MOUTH EVERY DAY IN THE EVENING  . glucose blood (ONETOUCH VERIO) test strip check sugar once daily No diagnosis found. DX: E11.9  . Lancets (ONETOUCH DELICA PLUS [id number]) MISC USE TO TEST BLOOD SUGAR ONCE DAILY AS NEEDED  . latanoprost (XALATAN) 0.005 % ophthalmic solution 1 drop at bedtime.  . metFORMIN (GLUCOPHAGE) 500 MG tablet Take 2 tablets (1,000 mg total) by mouth 2 (two) times daily with a meal.  . metoprolol tartrate (LOPRESSOR) 25 MG tablet TAKE 1 TABLET BY MOUTH TWICE A DAY  . nitroGLYCERIN (NITROSTAT) 0.4 MG SL tablet Place 1 tablet (0.4 mg total) under the tongue every 5 (five) minutes as needed for chest pain.  Marland Kitchen omega-3 acid ethyl esters (LOVAZA) 1 g capsule Take 1 capsule (1 g total) by mouth 2 (two) times daily.  Glory Rosebush VERIO test strip CHECK SUGAR ONCE DAILY. DX: E11.9  . phenylephrine (NEO-SYNEPHRINE) 1 % nasal spray Place 1 drop into both nostrils at bedtime as needed for congestion.  . RHOPRESSA 0.02 % SOLN Apply 1 drop to eye at bedtime.  . rosuvastatin (CRESTOR) 20 MG tablet TAKE 1 TABLET BY MOUTH EVERY DAY  . [DISCONTINUED] blood glucose meter kit and supplies One touch Verio meter.  Use daily as needed to check sugar.  DM2.  E11.9. Dispense 1 meter.  . [DISCONTINUED] ONETOUCH VERIO test strip USE 1 STRIP ONCE A DAY. DIAGNOSIS: E11.9 NON  INSULIN DEPENDENT.   No facility-administered encounter medications on file as of 08/24/2020.    Current Diagnosis/Assessment:  SDOH Interventions   Flowsheet Row Most Recent Value  SDOH Interventions   Financial Strain Interventions Intervention Not Indicated  [Medications affordable]      Goals Addressed            This Visit's Progress   . Pharmacy Care Plan       CARE PLAN ENTRY (see longitudinal plan of care for additional care plan information)  Current Barriers:   . Chronic Disease Management support, education, and care coordination needs related to Hypertension, Hyperlipidemia, and Diabetes   Hypertension BP Readings from Last 3 Encounters:  05/11/20 112/72  04/17/20 125/78  12/16/19 138/86   . Pharmacist Clinical Goal(s): o Over the next 6 months, patient will work with PharmD and providers to maintain BP goal <130/80 . Current regimen:   Amlodipine-benazepril 5-20 mg - 1 tablet daily  Metoprolol tartrate 25 mg - 1 tablet twice daily . Interventions: o Discussed increasing activity level o Reviewed home BP log and adherence  . Patient self care activities - Over the next 6 months, patient will: o Check BP twice weekly, document, and provide at future appointments o Keep pillbox in an obvious place to help remember evening medications o Recommend breaking up periods of sitting every hour with 10 minutes of standing/household activities.   Hyperlipidemia Lab Results  Component Value Date/Time   LDLCALC 67 09/14/2019 08:22 AM   LDLCALC 55 05/02/2017 12:00 AM   LDLDIRECT 70.0 11/29/2015 08:47 AM   . Pharmacist Clinical Goal(s): o Over the next 6 months, patient will work with PharmD and providers to maintain LDL goal < 70 . Current regimen:  . Rosuvastatin 20 mg - 1 tablet daily  . Fenofibrate 145 mg - 1 tablet daily  . Omega 3-acid ethyl esters 1 gm - 1 capsule twice daily . Interventions: o Reviewed adherence - patient misses evening medications a couple times a month. He takes fenofibrate at night.  o Discussed fenofibrate indication for triglycerides over 500. With goal of reducing unneccessary medications, recommended patient discuss fenofibrate with prescriber (cardiology) to see if he still needs to take this medication. . Patient self care activities - Over the next 6 months, patient will: o Discuss fenofibrate with your cardiologist - you may no longer need this medication.   Diabetes Lab Results  Component Value Date/Time    HGBA1C 6.6 (A) 05/11/2020 08:18 AM   HGBA1C 6.7 (A) 12/16/2019 08:21 AM   HGBA1C 7.7 (H) 09/14/2019 08:22 AM   HGBA1C 6.6 (H) 09/03/2018 08:41 AM   . Pharmacist Clinical Goal(s): o Over the next 6 months, patient will work with PharmD and providers to maintain A1c goal <7% . Current regimen:  o Metformin 500 mg - 2 tablets twice daily with meal . Interventions: o Reviewed home BG monitoring - 109, 111 o Discussed fasting BG goals of 80-130 and hypoglycemia (< 70). Patient denies hypoglycemia. o Reviewed current diet - patient limits fried foods and white starches . Patient self care activities - Over the next 6 months, patient will: o Continue to check blood sugar once daily, document, and provide at future appointments o Contact provider with any episodes of hypoglycemia  Medication management . Pharmacist Clinical Goal(s): o Over the next 6 months, patient will work with PharmD and providers to achieve optimal medication adherence . Current pharmacy: [facility]  . Interventions o Discussed benefits of medication synchronization, packaging and  delivery as well as enhanced pharmacist oversight. o Patient would like to Continue current medication management strategy o Recommend continuing to use pillbox and try to remember evening medications with an alarm or by putting pillbox in a more obvious location. . Patient self care activities - Over the next 6 months, patient will: o Focus on medication adherence by taking evening medications daily  Initial goal documentation       Hypertension   CMP Latest Ref Rng & Units 09/14/2019 09/03/2018 05/08/2018  Glucose 70 - 99 mg/dL 154(H) 134(H) 171(H)  BUN 6 - 23 mg/dL 10 10 7(L)  Creatinine 0.40 - 1.50 mg/dL 0.82 0.85 0.90  Sodium 135 - 145 mEq/L 136 141 136  Potassium 3.5 - 5.1 mEq/L 4.7 5.0 3.9  Chloride 96 - 112 mEq/L 101 104 100  CO2 19 - 32 mEq/L 30 29 24   Calcium 8.4 - 10.5 mg/dL 9.7 9.6 9.4  Total Protein 6.0 - 8.3 g/dL  7.1 7.0 -  Total Bilirubin 0.2 - 1.2 mg/dL 0.6 0.6 -  Alkaline Phos 39 - 117 U/L 52 52 -  AST 0 - 37 U/L 20 16 -  ALT 0 - 53 U/L 26 18 -   Office blood pressures are: BP Readings from Last 3 Encounters:  05/11/20 112/72  04/17/20 125/78  12/16/19 138/86   BP goal < 130/80 mmHg Patient has failed these meds in the past: none reported Patient checks BP at home: 2-3 days weekly  Patient home BP readings are ranging: this morning 129/73 mmHg (just purchased a digital arm monitor) Denies significant BP fluctuations.   BP meds managed by cardiology, Dr. Einar Gip Patient is currently controlled on the following medications:   Amlodipine-benazepril 5-20 mg - 1 tablet daily   Metoprolol tartrate 25 mg - 1 tablet BID  We discussed 08/24/20: Pt reports trying to be as active as possible. Does not have a way track activity level such as fitbit or iphone. Recommend breaking periods of sitting every hour with 10 minutes of standing/walking around house.   Plan: Continue current medications; Recommend breaking up periods of sitting every hour with 10 minutes of standing/household activities. Keep pillbox in an obvious place to help remember evening medications.   Hyperlipidemia   LDL goal < 70 (CAD/PAD)  Last lipids Lab Results  Component Value Date   CHOL 135 09/14/2019   HDL 37.60 (L) 09/14/2019   LDLCALC 67 09/14/2019   LDLDIRECT 70.0 11/29/2015   TRIG 152.0 (H) 09/14/2019   CHOLHDL 4 09/14/2019   Hepatic Function Latest Ref Rng & Units 09/14/2019 09/03/2018 03/13/2018  Total Protein 6.0 - 8.3 g/dL 7.1 7.0 7.1  Albumin 3.5 - 5.2 g/dL 4.3 4.1 4.2  AST 0 - 37 U/L 20 16 15   ALT 0 - 53 U/L 26 18 17   Alk Phosphatase 39 - 117 U/L 52 52 39  Total Bilirubin 0.2 - 1.2 mg/dL 0.6 0.6 0.7  Bilirubin, Direct 0.0 - 0.3 mg/dL - - -    The 10-year ASCVD risk score Mikey Bussing DC Jr., et al., 2013) is: 30.1%   Values used to calculate the score:     Age: 2 years     Sex: Male     Is Non-Hispanic African  American: No     Diabetic: Yes     Tobacco smoker: No     Systolic Blood Pressure: 771 mmHg     Is BP treated: Yes     HDL Cholesterol: 37.6 mg/dL  Total Cholesterol: 135 mg/dL   Patient has failed these meds in past: none reported Patient is currently controlled on the following medications:  . Rosuvastatin 20 mg - 1 tablet daily (morning time) . Fenofibrate 145 mg - 1 tablet daily (evening) . Omega 3-acid ethyl esters 1 gm - 1 capsule BID . Cilostazol 100 mg - 1 tablet BID per Dr. Damita Dunnings for PAD . Aspirin 81 mg - 1 tablet daily (morning)  All cholesterol meds managed by cardiology, Dr. Einar Gip No history of TG > 500 per chart. Appears pt was on gemfibrozil 2014-2017 then switched to fenofibrate in 2017 due to drug interaction with Crestor. Crestor was started 2015.   We discussed 08/24/20: Reviewed adherence - Patient reports missing evening medications (Fenofibrate, second dose omega 3-fatty acids) a couple times a month. Recommended adherence packaging, pt declines at this time. In light of reducing unneccessary  medications, recommended patient discuss fenofibrate with cardiology to see if he still needs to take this medication.    Plan: Continue current medications  Diabetes   Lab Results  Component Value Date   CREATININE 0.82 09/14/2019   BUN 10 09/14/2019   GFR 92.82 09/14/2019   GFRNONAA >60 05/08/2018   GFRAA >60 05/08/2018   NA 136 09/14/2019   K 4.7 09/14/2019   CALCIUM 9.7 09/14/2019   CO2 30 09/14/2019   Recent Relevant Labs: Lab Results  Component Value Date/Time   HGBA1C 6.6 (A) 05/11/2020 08:18 AM   HGBA1C 6.7 (A) 12/16/2019 08:21 AM   HGBA1C 7.7 (H) 09/14/2019 08:22 AM   HGBA1C 6.6 (H) 09/03/2018 08:41 AM   MICROALBUR >300.0 05/27/2013 08:35 AM   MICROALBUR 6.8 05/29/2012 08:24 AM    A1c goal < 7% Checking BG: Daily (usually fasting, occasionally checks later in the day)  Recent FBG Readings: 109 (08/24/20), 111 (08/23/20)  Pt reports highest recent  fasting BG 120-125  Denies any BG < 70  Patient has failed these meds in past: none Patient is currently controlled on the following medications:   Metformin 500 mg - 1 or 2 tablets twice daily (patient reports taking 2 tablets BID)  Last diabetic Foot exam: 09/17/19 per PCP, normal Last diabetic Eye exam: 11/2019 per pt report   We discussed 08/24/20:  Metformin Rx written for 1-2 tablets causing late refills in the past. Pt reports its been a while since he increased to 2 tablets BID due to BG elevations. He denies any GI side effects including diarrhea, constipation, bloating.   Diet - Pt reports avoiding fried foods. Reports he went to diabetic dietary education class a while ago. He watches carbs by trying to stay away from "white" foods. Discussed different types of carbs and portion control.  Plan: Continue current medications  Medication Management   Misc/Glaucoma: [name] - opthalmology Brimonidine eye drops Dorzolamide-timolol eye drops Rhopressa eye drops Latanoprost eye drops   Pt confirms taking 4 eye drops - two are BID, two are only at bedtime.  OTC:  Uses phenylephrine nasal spray as night PRN for nasal congestion  Denies other OTCs including vitamins, supplements, NSAIDs, Tylenol  Patient's [facility] pharmacy [facility]  [id number] [place] [id number]: [phone]: [phone] Uses pill box? Yes - morning and evening pillbox  Missed 2 days of medications in the past month.  Rarely misses morning meds, usually evening.   Refills are timely on all meds except fenofibrate (pt reports this was actually filled  08/08/20 30 DS), metformin (has large bottle on hand - likely due to rx for 1-2 tablets BID and dose change), eye drops, and amlodipine/benazapril (Pt reports this was actually filled 07/19/20 90 DS) > 15 days past due   We discussed: Discussed benefits of medication  synchronization, packaging and delivery as well as enhanced pharmacist oversight with [facility].  Plan  Continue current medication management strategy  Follow up: 6 month phone visit (phone visit)   Debbora Dus, PharmD Clinical Pharmacist [facility] at [facility] [phone]

## 2020-08-24 ENCOUNTER — Other Ambulatory Visit: Payer: Self-pay

## 2020-08-24 ENCOUNTER — Ambulatory Visit (INDEPENDENT_AMBULATORY_CARE_PROVIDER_SITE_OTHER): Payer: Medicare HMO

## 2020-08-24 DIAGNOSIS — E1159 Type 2 diabetes mellitus with other circulatory complications: Secondary | ICD-10-CM

## 2020-08-24 DIAGNOSIS — E785 Hyperlipidemia, unspecified: Secondary | ICD-10-CM

## 2020-08-24 DIAGNOSIS — I152 Hypertension secondary to endocrine disorders: Secondary | ICD-10-CM

## 2020-08-24 NOTE — Patient Instructions (Addendum)
August 24, 2020  Dear Jorge Adams,  It was a pleasure meeting you during our initial appointment on August 24, 2020. Below is a summary of the goals we discussed and components of chronic care management. Please contact me anytime with questions or concerns.   Visit Information  Goals Addressed            This Visit's Progress   . Pharmacy Care Plan       CARE PLAN ENTRY (see longitudinal plan of care for additional care plan information)  Current Barriers:  . Chronic Disease Management support, education, and care coordination needs related to Hypertension, Hyperlipidemia, and Diabetes   Hypertension BP Readings from Last 3 Encounters:  05/11/20 112/72  04/17/20 125/78  12/16/19 138/86   . Pharmacist Clinical Goal(s): o Over the next 6 months, patient will work with PharmD and providers to maintain BP goal <130/80 . Current regimen:   Amlodipine-benazepril 5-20 mg - 1 tablet daily  Metoprolol tartrate 25 mg - 1 tablet twice daily . Interventions: o Discussed increasing activity level o Reviewed home BP log and adherence  . Patient self care activities - Over the next 6 months, patient will: o Check BP twice weekly, document, and provide at future appointments o Keep pillbox in an obvious place to help remember evening medications o Recommend breaking up periods of sitting every hour with 10 minutes of standing/household activities.   Hyperlipidemia Lab Results  Component Value Date/Time   LDLCALC 67 09/14/2019 08:22 AM   LDLCALC 55 05/02/2017 12:00 AM   LDLDIRECT 70.0 11/29/2015 08:47 AM   . Pharmacist Clinical Goal(s): o Over the next 6 months, patient will work with PharmD and providers to maintain LDL goal < 70 . Current regimen:  . Rosuvastatin 20 mg - 1 tablet daily  . Fenofibrate 145 mg - 1 tablet daily  . Omega 3-acid ethyl esters 1 gm - 1 capsule twice daily . Interventions: o Reviewed adherence - patient misses evening medications a couple  times a month. He takes fenofibrate at night.  o Discussed fenofibrate indication for triglycerides over 500. With goal of reducing unneccessary medications, recommended patient discuss fenofibrate with prescriber (cardiology) to see if he still needs to take this medication. . Patient self care activities - Over the next 6 months, patient will: o Discuss fenofibrate with your cardiologist - you may no longer need this medication.   Diabetes Lab Results  Component Value Date/Time   HGBA1C 6.6 (A) 05/11/2020 08:18 AM   HGBA1C 6.7 (A) 12/16/2019 08:21 AM   HGBA1C 7.7 (H) 09/14/2019 08:22 AM   HGBA1C 6.6 (H) 09/03/2018 08:41 AM   . Pharmacist Clinical Goal(s): o Over the next 6 months, patient will work with PharmD and providers to maintain A1c goal <7% . Current regimen:  o Metformin 500 mg - 2 tablets twice daily with meal . Interventions: o Reviewed home BG monitoring - 109, 111 o Discussed fasting BG goals of 80-130 and hypoglycemia (< 70). Patient denies hypoglycemia. o Reviewed current diet - patient limits fried foods and white starches . Patient self care activities - Over the next 6 months, patient will: o Continue to check blood sugar once daily, document, and provide at future appointments o Contact provider with any episodes of hypoglycemia  Medication management . Pharmacist Clinical Goal(s): o Over the next 6 months, patient will work with PharmD and providers to achieve optimal medication adherence . Current pharmacy: [facility]  . Interventions o Discussed benefits of medication  synchronization, packaging and delivery as well as enhanced pharmacist oversight. o Patient would like to Continue current medication management strategy o Recommend continuing to use pillbox and try to remember evening medications with an alarm or by putting pillbox in a more obvious location. . Patient self care activities - Over the next 6 months, patient will: o Focus on medication  adherence by taking evening medications daily  Initial goal documentation        Jorge Adams was given information about Chronic Care Management services today including:  1. CCM service includes personalized support from designated clinical staff supervised by his physician, including individualized plan of care and coordination with other care providers 2. 24/7 contact phone numbers for assistance for urgent and routine care needs. 3. Standard insurance, coinsurance, copays and deductibles apply for chronic care management only during months in which we provide at least 20 minutes of these services. Most insurances cover these services at 100%, however patients may be responsible for any copay, coinsurance and/or deductible if applicable. This service may help you avoid the need for more expensive face-to-face services. 4. Only one practitioner may furnish and bill the service in a calendar month. 5. The patient may stop CCM services at any time (effective at the end of the month) by phone call to the office staff.  Patient agreed to services and verbal consent obtained.   The patient verbalized understanding of instructions, educational materials, and care plan provided today and declined offer to receive copy of patient instructions, educational materials, and care plan.  Telephone follow up appointment with pharmacy team member scheduled for: February 22, 2021 at 8:30 AM (PHONE)  Debbora Dus, PharmD Clinical Pharmacist [facility] at [facility] [phone]   Basics of Medicine Management Taking your medicines correctly is an important part of managing or preventing medical problems. Make sure you know what disease or condition your medicine is treating, and how and when to take it. If you do not take your medicine correctly, it may not work well and may cause unpleasant side effects, including serious health problems. What should I do when I am taking medicines?  Read  all the labels and inserts that come with your medicines. Review the information often.  Talk with your pharmacist if you get a refill and notice a change in the size, color, or shape of your medicines.  Know the potential side effects for each medicine that you take.  Try to get all your medicines from the same pharmacy. The pharmacist will have all your information and will understand how your medicines will affect each other (interact).  Tell your health care provider about all your medicines, including over-the-counter medicines, vitamins, and herbal or dietary supplements. He or she will make sure that nothing will interact with any of your prescribed medicines.   How can I take my medicines safely?  Take medicines only as told by your health care provider. ? Do not take more of your medicine than instructed. ? Do not take anyone else's medicines. ? Do not share your medicines with others. ? Do not stop taking your medicines unless your health care provider tells you to do so. ? You may need to avoid alcohol or certain foods or liquids when taking certain medicines. Follow your health care provider's instructions.  Do not split, mash, or chew your medicines unless your health care provider tells you to do so. Tell your health care provider if you have trouble swallowing your medicines.  For  liquid medicine, use the dosing container that was provided. How should I organize my medicines? Know your medicines  Know what each of your medicines looks like. This includes size, color, and shape. Tell your health care provider if you are having trouble recognizing all the medicines that you are taking.  If you cannot tell your medicines apart because they look similar, keep them in original bottles.  If you cannot read the labels on the bottles, tell your pharmacist to put your medicines in containers with large print.  Review your medicines and your schedule with family members, a friend,  or a caregiver. Use a pill organizer  Use a tool to organize your medicine schedule. Tools include a weekly pillbox, a written chart, a notebook, or a calendar.  Your tool should help you remember the following things about each medicine: ? The name of the medicine. ? The amount (dose) to take. ? The schedule. This is the day and time the medicine should be taken. ? The appearance. This includes color, shape, size, and stamp. ? How to take your medicines. This includes instructions to take them with food, without food, with fluids, or with other medicines.  Create reminders for taking your medicines. Use sticky notes, or alarms on your watch, mobile device, or phone calendar.  You may choose to use a more advanced management system. These systems have storage, alarms, and visual and audio prompts.  Some medicines can be taken on an "as-needed" basis. These include medicines for nausea or pain. If you take an as-needed medicine, write down the name and dose, as well as the date and time that you took it.   How should I plan for travel?  Take your pillbox, medicines, and organization system with you when traveling.  Have your medicines refilled before you travel. This will ensure that you do not run out of your medicines while you are away from home.  Always carry an updated list of your medicines with you. If there is an emergency, a first responder can quickly see what medicines you are taking.  Do not pack your medicines in checked luggage in case your luggage is lost or delayed.  If any of your medicines is considered a controlled substance, make sure you bring a letter from your health care provider with you. How should I store and discard my medicines? For safe storage:  Store medicines in a cool, dry area away from light, or as directed by your health care provider. Do not store medicines in the bathroom. Heat and humidity will affect them.  Do not store your medicines with  other chemicals, or with medicines for pets or other household members.  Keep medicines away from children and pets. Do not leave them on counters or bedside tables. Store them in high cabinets or on high shelves. For safe disposal:  Check expiration dates regularly. Do not take expired medicines. Discard medicines that are older than the expiration date.  Learn a safe way to dispose of your medicines. You may: ? Use a local government, hospital, or pharmacy medicine-take-back program. ? Mix the medicines with inedible substances, put them in a sealed bag or empty container, and throw them in the trash. What should I remember?  Tell your health care provider if you: ? Experience side effects. ? Have new symptoms. ? Have other concerns about taking your medicines.  Review your medicines regularly with your health care provider. Other medicines, diet, medical conditions, weight changes, and daily habits  can all affect how medicines work. Ask if you need to continue taking each medicine, and discuss how well each one is working.  Refill your medicines early to avoid running out of them.  In case of an accidental overdose, call your local [place] at [phone] or visit your local emergency department immediately. This is important. Summary  Taking your medicines correctly is an important part of managing or preventing medical problems.  You need to make sure that you understand what you are taking a medicine for, as well as how and when you need to take it.  Know your medicines and use a pill organizer to help you take your medicines correctly.  In case of an accidental overdose, call your local [place] at [phone] or visit your local emergency department immediately. This is important. This information is not intended to replace advice given to you by your health care provider. Make sure you discuss any questions you have with your health care  provider. Document Revised: 07/03/2017 Document Reviewed: 07/03/2017 Elsevier Patient Education  2021 [employer].

## 2020-09-06 ENCOUNTER — Other Ambulatory Visit: Payer: Self-pay | Admitting: Family Medicine

## 2020-09-06 DIAGNOSIS — E1159 Type 2 diabetes mellitus with other circulatory complications: Secondary | ICD-10-CM

## 2020-09-08 ENCOUNTER — Other Ambulatory Visit: Payer: Self-pay

## 2020-09-08 MED ORDER — BLOOD GLUCOSE METER KIT: PACK | 12 refills | Status: DC

## 2020-09-08 MED ORDER — BLOOD GLUCOSE METER KIT
PACK | 12 refills | Status: DC
Start: 2020-09-08 — End: 2022-05-02

## 2020-09-08 NOTE — Progress Notes (Signed)
Patient needs new rxs for BG supplies; patient insurance now prefers Acc[employer]-check. New rxs sent.

## 2020-09-11 ENCOUNTER — Other Ambulatory Visit (INDEPENDENT_AMBULATORY_CARE_PROVIDER_SITE_OTHER): Payer: Medicare HMO

## 2020-09-11 ENCOUNTER — Other Ambulatory Visit: Payer: Self-pay

## 2020-09-11 DIAGNOSIS — E1159 Type 2 diabetes mellitus with other circulatory complications: Secondary | ICD-10-CM

## 2020-09-11 LAB — HEMOGLOBIN A1C: Hgb A1c MFr Bld: 6.4 % (ref 4.6–6.5)

## 2020-09-11 LAB — LIPID PANEL
Cholesterol: 132 mg/dL (ref 0–200)
HDL: 44.8 mg/dL (ref >39.00)
LDL Cholesterol: 58 mg/dL (ref 0–99)
NonHDL: 87.07
Total CHOL/HDL Ratio: 3
Triglycerides: 147 mg/dL (ref 0.0–149.0)
VLDL: 29.4 mg/dL (ref 0.0–40.0)

## 2020-09-11 LAB — COMPREHENSIVE METABOLIC PANEL
ALT: 15 U/L (ref 0–53)
AST: 15 U/L (ref 0–37)
Albumin: 4.1 g/dL (ref 3.5–5.2)
Alkaline Phosphatase: 49 U/L (ref 39–117)
BUN: 9 mg/dL (ref 6–23)
CO2: 26 mEq/L (ref 19–32)
Calcium: 9.7 mg/dL (ref 8.4–10.5)
Chloride: 103 mEq/L (ref 96–112)
Creatinine, Ser: 0.74 mg/dL (ref 0.40–1.50)
GFR: 91.33 mL/min (ref >60.00)
Glucose, Bld: 132 mg/dL — ABNORMAL HIGH (ref 70–99)
Potassium: 4.2 mEq/L (ref 3.5–5.1)
Sodium: 137 mEq/L (ref 135–145)
Total Bilirubin: 0.6 mg/dL (ref 0.2–1.2)
Total Protein: 7.1 g/dL (ref 6.0–8.3)

## 2020-09-20 DIAGNOSIS — H401131 Primary open-angle glaucoma, bilateral, mild stage: Secondary | ICD-10-CM | POA: Diagnosis not present

## 2020-10-13 ENCOUNTER — Other Ambulatory Visit: Payer: Self-pay | Admitting: Family Medicine

## 2020-10-31 ENCOUNTER — Ambulatory Visit: Payer: Medicare HMO | Attending: Internal Medicine

## 2020-10-31 ENCOUNTER — Other Ambulatory Visit: Payer: Self-pay

## 2020-10-31 DIAGNOSIS — Z23 Encounter for immunization: Secondary | ICD-10-CM

## 2020-10-31 MED ORDER — PFIZER-BIONT COVID-19 VAC-TRIS 30 MCG/0.3ML IM SUSP
INTRAMUSCULAR | 0 refills | Status: DC
  Filled 2020-10-31: qty 0.3, 20d supply, fill #0

## 2020-10-31 NOTE — Progress Notes (Signed)
   Covid-19 Vaccination Clinic  Name:  Jorge Adams    MRN: [id number] DOB: 1951-06-11  10/31/2020  Mr. Jorge Adams was observed post Covid-19 immunization for 15 minutes without incident. He was provided with Vaccine Information Sheet and instruction to access the V-Safe system.   Mr. Jorge Adams was instructed to call 911 with any severe reactions post vaccine: Marland Kitchen Difficulty breathing  . Swelling of face and throat  . A fast heartbeat  . A bad rash all over body  . Dizziness and weakness   Immunizations Administered    Name Date Dose VIS Date Route   PFIZER Comrnaty(Gray TOP) Covid-19 Vaccine 10/31/2020  9:30 AM 0.3 mL 06/29/2020 Intramuscular   Manufacturer: [employer], [employer]   Lot: [id number]   [place]: [phone]

## 2020-11-22 ENCOUNTER — Telehealth: Payer: Self-pay

## 2020-11-22 DIAGNOSIS — H401131 Primary open-angle glaucoma, bilateral, mild stage: Secondary | ICD-10-CM | POA: Diagnosis not present

## 2020-11-22 NOTE — Chronic Care Management (AMB) (Addendum)
Chronic Care Management Pharmacy Assistant   Name: Jorge Adams  MRN: [id number] DOB: 01/26/1951  Reason for Encounter: Disease State  HTN,DM   Conditions to be addressed/monitored: HTN and DMII  Recent office visits:  None since last CCM contact  Recent consult visits:  None since last  CCM contact  Hospital visits:  None in previous 6 months  Medications: Outpatient Encounter Medications as of 11/22/2020  Medication Sig   amLODipine-benazepril (LOTREL) 5-20 MG capsule TAKE 1 CAPSULE BY MOUTH EVERY DAY   aspirin EC 81 MG tablet Take 1 tablet (81 mg total) by mouth daily.   blood glucose meter kit and supplies Dispense based on pt and insurance preference. Use up to 3 times daily as directed. (Dx. E11.9).   Blood Pressure Monitoring (SPHYGMOMANOMETER) MISC Use daily to check BP.  Dx I10   brimonidine (ALPHAGAN P) 0.1 % SOLN Place 1 drop into both eyes 2 (two) times daily.   cilostazol (PLETAL) 100 MG tablet TAKE 1 TABLET BY MOUTH TWICE A DAY   COVID-19 mRNA Vac-TriS, Pfizer, (PFIZER-BIONT COVID-19 VAC-TRIS) SUSP injection Inject into the muscle.   dorzolamide-timolol (COSOPT) 22.3-6.8 MG/ML ophthalmic solution Place 1 drop into both eyes 2 (two) times daily.   fenofibrate (TRICOR) 145 MG tablet TAKE 1 TABLET BY MOUTH EVERY DAY IN THE EVENING   latanoprost (XALATAN) 0.005 % ophthalmic solution 1 drop at bedtime.   metFORMIN (GLUCOPHAGE) 500 MG tablet TAKE 1 TO 2 TABLETS BY MOUTH TWICE A DAY WITH A MEAL   metoprolol tartrate (LOPRESSOR) 25 MG tablet TAKE 1 TABLET BY MOUTH TWICE A DAY   nitroGLYCERIN (NITROSTAT) 0.4 MG SL tablet Place 1 tablet (0.4 mg total) under the tongue every 5 (five) minutes as needed for chest pain.   omega-3 acid ethyl esters (LOVAZA) 1 g capsule Take 1 capsule (1 g total) by mouth 2 (two) times daily.   phenylephrine (NEO-SYNEPHRINE) 1 % nasal spray Place 1 drop into both nostrils at bedtime as needed for congestion.   RHOPRESSA 0.02 % SOLN Apply 1  drop to eye at bedtime.   rosuvastatin (CRESTOR) 20 MG tablet TAKE 1 TABLET BY MOUTH EVERY DAY   No facility-administered encounter medications on file as of 11/22/2020.   Recent Office Vitals: BP Readings from Last 3 Encounters:  05/11/20 112/72  04/17/20 125/78  12/16/19 138/86   Pulse Readings from Last 3 Encounters:  05/11/20 79  04/17/20 74  12/16/19 70    Wt Readings from Last 3 Encounters:  05/11/20 215 lb 1 oz (97.6 kg)  04/17/20 219 lb (99.3 kg)  12/16/19 218 lb (98.9 kg)     Kidney Function Lab Results  Component Value Date/Time   CREATININE 0.74 09/11/2020 07:44 AM   CREATININE 0.82 09/14/2019 08:22 AM   CREATININE 0.78 05/02/2017 12:00 AM   CREATININE 0.78 09/12/2016 12:00 AM   CREATININE 0.71 10/26/2012 09:03 AM   GFR 91.33 09/11/2020 07:44 AM   GFRNONAA >60 05/08/2018 05:27 AM   GFRAA >60 05/08/2018 05:27 AM    BMP Latest Ref Rng & Units 09/11/2020 09/14/2019 09/03/2018  Glucose 70 - 99 mg/dL 132(H) 154(H) 134(H)  BUN 6 - 23 mg/dL _0 Creatinine 0.40 - 1.50 mg/dL 0.74 0.82 0.85  BUN/Creat Ratio 10 - 24 - - -  Sodium 135 - 145 mEq/L 137 136 141  Potassium 3.5 - 5.1 mEq/L 4.2 4.7 5.0  Chloride 96 - 112 mEq/L 103 101 104  CO2 19 - 32 mEq/L 26  30 29  Calcium 8.4 - 10.5 mg/dL 9.7 9.7 9.6   Current antihypertensive regimen:  Amlodipine-benazepril 5-20 mg - 1 tablet daily Metoprolol tartrate 25 mg - 1 tablet twice daily   Patient verbally confirms he is taking the above medications as directed. Yes  How often are you checking your Blood Pressure? infrequently  The patient only checks his BP on occasion   Current home BP readings: none available  Wrist or arm cuff:  Arm cuff Caffeine intake: Decaff only Salt intake: limits daily  OTC medications including pseudoephedrine or NSAIDs? He will use (Neosynephrine) nasal spray at night. Any readings above 180/120? No If yes any symptoms of hypertensive emergency? patient denies any symptoms of high blood  pressure  What recent interventions/DTPs have been made by any provider to improve Blood Pressure control since last CPP Visit: The patient reports he is  increasing activity level, he is  breaking up periods of sitting every hour with 10 minutes of standing/household activities.   Any recent hospitalizations or ED visits since last visit with CPP? No  What diet changes have been made to improve Blood Pressure Control?   The patient reports he cooks healthy meals at home and will limit his salt intake.  What exercise is being done to improve your Blood Pressure Control?  He stays active and is getting ready to mow the lawn.  Adherence Review: Is the patient currently on ACE/ARB medication? Yes Does the patient have >5 day gap between last estimated fill dates? Yes - ACE and metformin past due.  At last visit discussed he has excess metformin due to rx for 1-2 tablets BID and dose change.  Star Rating Drugs:  Medication:  Last Fill: Day Supply Lotrel 5-61m  07/31/2020 90ds Rosuvastatin 237m 09/05/2020 90ds Metformin 50069m     04/08/2020 90ds  Recent Relevant Labs: Lab Results  Component Value Date/Time   HGBA1C 6.4 09/11/2020 07:44 AM   HGBA1C 6.6 (A) 05/11/2020 08:18 AM   HGBA1C 6.7 (A) 12/16/2019 08:21 AM   HGBA1C 7.7 (H) 09/14/2019 08:22 AM   MICROALBUR >300.0 05/27/2013 08:35 AM   MICROALBUR 6.8 05/29/2012 08:24 AM    Kidney Function Lab Results  Component Value Date/Time   CREATININE 0.74 09/11/2020 07:44 AM   CREATININE 0.82 09/14/2019 08:22 AM   CREATININE 0.78 05/02/2017 12:00 AM   CREATININE 0.78 09/12/2016 12:00 AM   CREATININE 0.71 10/26/2012 09:03 AM   GFR 91.33 09/11/2020 07:44 AM   GFRNONAA >60 05/08/2018 05:27 AM   GFRAA >60 05/08/2018 05:27 AM   Current antihyperglycemic regimen:   Metformin 500 mg  2 tablets 2 times a day     Patient verbally confirms he is taking the above medications as directed. Yes  What recent interventions/DTPs have been made  to improve glycemic control:  The patient reports he limits fried foods and white starches.       The patient reports he just had eye visit and got a good report.  Have there been any recent hospitalizations or ED visits since last visit with CPP? No  Patient denies hypoglycemic symptoms, including Pale, Sweaty, Shaky, Hungry, Nervous/irritable and Vision changes    Patient denies hyperglycemic symptoms, including blurry vision, excessive thirst, fatigue, polyuria and weakness  How often are you checking your blood sugar? once daily  What are your blood sugars ranging?  Fasting:  The patient reports average  120  On insulin? No   During the week, how often does your blood glucose  drop below 70? Never  Are you checking your feet daily/regularly? Yes   Follow-Up:  Pharmacist Review  Debbora Dus, CPP notified  [name] [facility] Clinical Pharmacy Assistant [phone]  I have reviewed the care management and care coordination activities outlined in this encounter and I am certifying that I agree with the content of this note. Will continue to encourage daily adherence. Recommend pill packaging. BP, cholesterol and BG are well controlled.  Debbora Dus, PharmD Clinical Pharmacist [facility] at [facility] [phone]

## 2020-12-02 ENCOUNTER — Other Ambulatory Visit: Payer: Self-pay | Admitting: Family Medicine

## 2020-12-10 ENCOUNTER — Other Ambulatory Visit: Payer: Self-pay | Admitting: Cardiology

## 2020-12-10 DIAGNOSIS — E782 Mixed hyperlipidemia: Secondary | ICD-10-CM

## 2020-12-23 ENCOUNTER — Other Ambulatory Visit: Payer: Self-pay | Admitting: Cardiology

## 2020-12-23 DIAGNOSIS — I10 Essential (primary) hypertension: Secondary | ICD-10-CM

## 2021-01-08 ENCOUNTER — Other Ambulatory Visit: Payer: Self-pay

## 2021-01-08 ENCOUNTER — Other Ambulatory Visit: Payer: Self-pay | Admitting: Cardiology

## 2021-01-08 ENCOUNTER — Encounter: Payer: Self-pay | Admitting: Emergency Medicine

## 2021-01-08 ENCOUNTER — Ambulatory Visit
Admission: EM | Admit: 2021-01-08 | Discharge: 2021-01-08 | Disposition: A | Discharge status: Home / Self Care | Payer: Medicare HMO | Attending: Emergency Medicine | Admitting: Emergency Medicine

## 2021-01-08 DIAGNOSIS — M7021 Olecranon bursitis, right elbow: Secondary | ICD-10-CM | POA: Diagnosis not present

## 2021-01-08 MED ORDER — NAPROXEN 500 MG PO TABS: 500.0000 mg | ORAL_TABLET | Freq: Two times a day (BID) | ORAL | 0 refills | Status: DC

## 2021-01-08 NOTE — ED Provider Notes (Signed)
[facility]    CSN: [id number] Arrival date & time: 01/08/21  1032      History   Chief Complaint Chief Complaint  Patient presents with   Joint Swelling    Right elbow    HPI Jorge Adams is a 70 y.o. male.  Patient presents with tenderness, mild redness, warmth, swelling of his right elbow x3 days.  No falls or injury.  He denies fever, numbness, weakness, paresthesias, wounds, or other symptoms.  He took a dose of Tylenol yesterday for his symptoms without improvement.  His medical history includes hypertension, atherosclerosis, dyslipidemia, MI, diabetes, arthritis.  The history is provided by the patient and medical records.   Past Medical History:  Diagnosis Date   Arthritis    ASHD (arteriosclerotic heart disease)    Atherosclerosis of native arteries of the extremities with intermittent claudication    Cataract    Coronary atherosclerosis of native coronary artery    Diabetes mellitus    NIDDM   Dyslipidemia    ED (erectile dysfunction)    Full dentures    Glaucoma associated with ocular disorder, mild stage    bilateral   Hyperlipidemia    Hypertension    under control, has been on med. > 9 yr.   Impotence of organic origin    Meniscus tear 05/2012   right knee   Myocardial infarction [facility]) 10/2002   Stented coronary artery     Patient Active Problem List   Diagnosis Date Noted   Stenosis of cervical spine with myelopathy ([place]) 05/07/2018   Paresthesia 03/15/2018   Health care maintenance 09/04/2017   Snoring 23/30/0762   Umbilical hernia 26/33/3545   CAD (coronary artery disease) 01/02/2016   Angina pectoris ([place]) 12/18/2015   Routine general medical examination at a health care facility 12/07/2014   Advance care planning 12/07/2014   Atherosclerosis of native arteries of the extremities with intermittent claudication 12/05/2013   Erectile dysfunction associated with type 2 diabetes mellitus ([place]) 10/31/2013   Type 2 diabetes  mellitus with vascular disease ([place]) 10/31/2013   Senile nuclear sclerosis 06/04/2013   Borderline glaucoma with ocular hypertension 06/04/2013   Amblyopia, unspecified 06/04/2013   ASHD (arteriosclerotic heart disease) 01/28/2011   Hypogonadism, male 01/28/2011   Hypertension associated with diabetes ([place]) 01/28/2011   Hyperlipidemia LDL goal <70 01/28/2011   Obesity (BMI 30-39.9) 01/28/2011    Past Surgical History:  Procedure Laterality Date   ANTERIOR CERVICAL DECOMP/DISCECTOMY FUSION N/A 05/07/2018   Procedure: ANTERIOR CERVICAL DECOMPRESSION/DISCECTOMY FUSION CERVICAL FIVE - CERVICAL SIX;  Surgeon: Consuella Lose, MD;  Location: [place];  Service: Neurosurgery;  Laterality: N/A;  ANTERIOR CERVICAL DECOMPRESSION/DISCECTOMY FUSION CERVICAL FIVE - CERVICAL SIX   CARDIAC CATHETERIZATION  10/23/2002   had 1 stent placed then-- by Dr. Melvern Banker   CARDIAC CATHETERIZATION N/A 12/21/2015   Procedure: Left Heart Cath and Coronary Angiography;  Surgeon: Adrian Prows, MD;  Location: [place] [facility];  Service: Cardiovascular;  Laterality: N/A;   CATARACT EXTRACTION Right 05/2017   CATARACT EXTRACTION Left 07/2017   COLONOSCOPY     CORONARY ARTERY BYPASS GRAFT N/A 01/02/2016   Procedure: CORONARY ARTERY BYPASS GRAFTING (CABG) TIMES 3 USING LEFT INTERNAL MAMMARY ARTERY AND RIGHT SAPHENOUS LEG VEIN HARVESTED ENDOSCOPICALLY;  Surgeon: Ivin Poot, MD;  Location: [place];  Service: Open Heart Surgery;  Laterality: N/A;   INSERTION OF MESH N/A 05/06/2016   Procedure: INSERTION OF MESH;  Surgeon: Georganna Skeans, MD;  Location: [place];  Service: General;  Laterality: N/A;   KNEE ARTHROSCOPY WITH MEDIAL MENISECTOMY  06/04/2012   Procedure: KNEE ARTHROSCOPY WITH MEDIAL MENISECTOMY;  Surgeon: Ninetta Lights, MD;  Location: [place];  Service: Orthopedics;  Laterality: Right;  RIGHT SCOPE MEDIAL MENISCECTOMY, CHONDROPLASTY, EXCISION LOOSE BODY   LOWER EXTREMITY ANGIOGRAM N/A 11/23/2013    Procedure: LOWER EXTREMITY ANGIOGRAM;  Surgeon: Laverda Page, MD;  Location: [facility] CATH [facility];  Service: Cardiovascular;  Laterality: N/A;   NECK SURGERY N/A    02/19/2018   PERIPHERAL VASCULAR CATHETERIZATION N/A 12/21/2015   Procedure: Abdominal Aortogram;  Surgeon: Adrian Prows, MD;  Location: [place] [facility];  Service: Cardiovascular;  Laterality: N/A;   PERIPHERAL VASCULAR CATHETERIZATION N/A 12/21/2015   Procedure: Abdominal Aortogram w/Lower Extremity;  Surgeon: Adrian Prows, MD;  Location: [place] [facility];  Service: Cardiovascular;  Laterality: N/A;   TEE WITHOUT CARDIOVERSION N/A 01/02/2016   Procedure: TRANSESOPHAGEAL ECHOCARDIOGRAM (TEE);  Surgeon: Ivin Poot, MD;  Location: [place];  Service: Open Heart Surgery;  Laterality: N/A;   UMBILICAL HERNIA REPAIR N/A 05/06/2016   Procedure: HERNIA REPAIR UMBILICAL ADULT;  Surgeon: Georganna Skeans, MD;  Location: [place];  Service: General;  Laterality: N/A;       Home Medications    Prior to Admission medications   Medication Sig Start Date End Date Taking? Authorizing Provider  naproxen (NAPROSYN) 500 MG tablet Take 1 tablet (500 mg total) by mouth 2 (two) times daily. 01/08/21  Yes Sharion Balloon, NP  amLODipine-benazepril (LOTREL) 5-20 MG capsule TAKE 1 CAPSULE BY MOUTH EVERY DAY 07/19/20   Adrian Prows, MD  aspirin EC 81 MG tablet Take 1 tablet (81 mg total) by mouth daily. 05/13/18   Costella, Vista Mink, PA-C  blood glucose meter kit and supplies Dispense based on pt and insurance preference. Use up to 3 times daily as directed. (Dx. E11.9). 09/08/20   [name], MD  Blood Pressure Monitoring [facility]) MISC Use daily to check BP.  Dx I10 08/15/20   [name], MD  brimonidine (ALPHAGAN P) 0.1 % SOLN Place 1 drop into both eyes 2 (two) times daily.    [provider]  cilostazol (PLETAL) 100 MG tablet TAKE 1 TABLET BY MOUTH TWICE A DAY 12/04/20   [name], MD  COVID-19 mRNA Vac-TriS, Pfizer, (PFIZER-BIONT  COVID-19 VAC-TRIS) SUSP injection Inject into the muscle. 10/31/20   Carlyle Basques, MD  dorzolamide-timolol (COSOPT) 22.3-6.8 MG/ML ophthalmic solution Place 1 drop into both eyes 2 (two) times daily.    [provider]  fenofibrate (TRICOR) 145 MG tablet TAKE 1 TABLET BY MOUTH EVERY DAY IN THE EVENING 12/11/20   Adrian Prows, MD  latanoprost (XALATAN) 0.005 % ophthalmic solution 1 drop at bedtime. 08/30/19   [provider]  metFORMIN (GLUCOPHAGE) 500 MG tablet TAKE 1 TO 2 TABLETS BY MOUTH TWICE A DAY WITH A MEAL 10/13/20   [name], MD  metoprolol tartrate (LOPRESSOR) 25 MG tablet TAKE 1 TABLET BY MOUTH TWICE A DAY 12/25/20   Adrian Prows, MD  nitroGLYCERIN (NITROSTAT) 0.4 MG SL tablet Place 1 tablet (0.4 mg total) under the tongue every 5 (five) minutes as needed for chest pain. 03/30/19   Adrian Prows, MD  omega-3 acid ethyl esters (LOVAZA) 1 g capsule TAKE 1 CAPSULE BY MOUTH 2 TIMES DAILY. 01/08/21   Adrian Prows, MD  phenylephrine (NEO-SYNEPHRINE) 1 % nasal spray Place 1 drop into both nostrils at bedtime as needed for congestion.  [provider]  RHOPRESSA 0.02 % SOLN Apply 1 drop to eye at bedtime. 08/31/19   [provider]  rosuvastatin (CRESTOR) 20 MG tablet TAKE 1 TABLET BY MOUTH EVERY DAY 02/08/20   Adrian Prows, MD    Family History Family History  Problem Relation Age of Onset   Bone cancer Mother    Pneumonia Father    Diabetes Brother    Heart disease Brother        Pacemaker   COPD Brother    Colon cancer Neg Hx    Prostate cancer Neg Hx     Social History Social History   Tobacco Use   Smoking status: Former    Packs/day: 2.50    Years: 35.00    Pack years: 87.50    Types: Cigarettes    Quit date: 12/02/2002    Years since quitting: 18.1   Smokeless tobacco: Never  Vaping Use   Vaping Use: Never used  Substance Use Topics   Alcohol use: Yes    Comment: rare   Drug use: No     Allergies   Patient has no known  allergies.   Review of Systems Review of Systems  Constitutional:  Negative for chills and fever.  Respiratory:  Negative for cough and shortness of breath.   Cardiovascular:  Negative for chest pain and palpitations.  Musculoskeletal:  Positive for arthralgias and joint swelling.  Skin:  Positive for color change. Negative for rash and wound.  Neurological:  Negative for weakness and numbness.  All other systems reviewed and are negative.   Physical Exam Triage Vital Signs ED Triage Vitals  Enc Vitals Group     BP 01/08/21 1042 126/80     Pulse Rate 01/08/21 1042 73     Resp 01/08/21 1042 18     Temp 01/08/21 1042 98 F (36.7 C)     Temp Source 01/08/21 1042 Oral     SpO2 01/08/21 1042 94 %     Weight --      Height --      Head Circumference --      Peak Flow --      Pain Score 01/08/21 1045 6     Pain Loc --      Pain Edu? --      Excl. in [place]? --    No data found.  Updated Vital Signs BP 126/80 (BP Location: Left Arm)   Pulse 73   Temp 98 F (36.7 C) (Oral)   Resp 18   SpO2 94%   Visual Acuity Right Eye Distance:   Left Eye Distance:   Bilateral Distance:    Right Eye Near:   Left Eye Near:    Bilateral Near:     Physical Exam Vitals and nursing note reviewed.  Constitutional:      General: He is not in acute distress.    Appearance: He is well-developed. He is not ill-appearing.  HENT:     Head: Normocephalic and atraumatic.     Mouth/Throat:     Mouth: Mucous membranes are moist.  Eyes:     Conjunctiva/sclera: Conjunctivae normal.  Cardiovascular:     Rate and Rhythm: Normal rate and regular rhythm.     Heart sounds: Normal heart sounds.  Pulmonary:     Effort: Pulmonary effort is normal. No respiratory distress.     Breath sounds: Normal breath sounds.  Abdominal:     Palpations: Abdomen is soft.     Tenderness: There  is no abdominal tenderness.  Musculoskeletal:        General: Swelling and tenderness present. Normal range of motion.        Arms:     Cervical back: Neck supple.     Comments: Right elbow tender to palpation, edematous, mildly erythematous.  No wounds.  Skin:    General: Skin is warm and dry.     Capillary Refill: Capillary refill takes less than 2 seconds.     Findings: Erythema present. No bruising, lesion or rash.  Neurological:     General: No focal deficit present.     Mental Status: He is alert and oriented to person, place, and time.     Sensory: No sensory deficit.     Motor: No weakness.     Gait: Gait normal.  Psychiatric:        Mood and Affect: Mood normal.        Behavior: Behavior normal.     UC Treatments / Results  Labs (all labs ordered are listed, but only abnormal results are displayed) Labs Reviewed - No data to display  EKG   Radiology No results found.  Procedures Procedures (including critical care time)  Medications Ordered in UC Medications - No data to display  Initial Impression / Assessment and Plan / UC Course  I have reviewed the triage vital signs and the nursing notes.  Pertinent labs & imaging results that were available during my care of the patient were reviewed by me and considered in my medical decision making (see chart for details).  Right olecranon bursitis.  Treating with naproxen.  Discussed rest and protection of joint.  Education provided on elbow bursitis.  Instructed patient to follow-up with an orthopedist if his symptoms are not improving.  He agrees to plan of care.    Final Clinical Impressions(s) / UC Diagnoses   Final diagnoses:  Olecranon bursitis of right elbow     Discharge Instructions      Take the naproxen as directed.  See the attached information on elbow bursitis.    Follow-up with an orthopedist such as the one listed below if your symptoms are not improving.         ED Prescriptions     Medication Sig Dispense Auth. Provider   naproxen (NAPROSYN) 500 MG tablet Take 1 tablet (500 mg total) by mouth 2 (two)  times daily. 14 tablet Sharion Balloon, NP      PDMP not reviewed this encounter.   Sharion Balloon, NP 01/08/21 [phone]

## 2021-01-08 NOTE — ED Triage Notes (Signed)
Pt presents today with c/o of right elbow swelling and redness (warm to touch) x 3 days. Denies injury. He believes he may have been bit by insect.

## 2021-01-08 NOTE — Discharge Instructions (Addendum)
Take the naproxen as directed.  See the attached information on elbow bursitis.    Follow-up with an orthopedist such as the one listed below if your symptoms are not improving.

## 2021-01-10 DIAGNOSIS — L03113 Cellulitis of right upper limb: Secondary | ICD-10-CM | POA: Diagnosis not present

## 2021-01-19 DIAGNOSIS — L03113 Cellulitis of right upper limb: Secondary | ICD-10-CM | POA: Diagnosis not present

## 2021-01-26 DIAGNOSIS — L03113 Cellulitis of right upper limb: Secondary | ICD-10-CM | POA: Diagnosis not present

## 2021-01-30 ENCOUNTER — Ambulatory Visit (INDEPENDENT_AMBULATORY_CARE_PROVIDER_SITE_OTHER): Payer: Medicare HMO

## 2021-01-30 DIAGNOSIS — Z0000 Encounter for general adult medical examination without abnormal findings: Secondary | ICD-10-CM

## 2021-01-30 NOTE — Progress Notes (Signed)
PCP notes:  Health Maintenance: Colonoscopy- due Foot exam- due    Abnormal Screenings: none   Patient concerns: none   Nurse concerns: none   Next PCP appt.: none

## 2021-01-30 NOTE — Patient Instructions (Signed)
Mr. Jorge Adams , Thank you for taking time to come for your Medicare Wellness Visit. I appreciate your ongoing commitment to your health goals. Please review the following plan we discussed and let me know if I can assist you in the future.   Screening recommendations/referrals: Colonoscopy: due, will discuss with provider  Recommended yearly ophthalmology/optometry visit for glaucoma screening and checkup Recommended yearly dental visit for hygiene and checkup  Vaccinations: Influenza vaccine: Up to date, completed 05/11/2020, due 02/2021 Pneumococcal vaccine: Completed series Tdap vaccine: decline-insurance Shingles vaccine: Completed series   Covid-19: Completed series  Advanced directives: Advance directive discussed with you today. Even though you declined this today please call our office should you change your mind and we can give you the proper paperwork for you to fill out.  Conditions/risks identified: diabetes, hypertension, hyperlipidemia   Next appointment: Follow up in one year for your annual wellness visit.   Preventive Care 16 Years and Older, Male Preventive care refers to lifestyle choices and visits with your health care provider that can promote health and wellness. What does preventive care include? A yearly physical exam. This is also called an annual well check. Dental exams once or twice a year. Routine eye exams. Ask your health care provider how often you should have your eyes checked. Personal lifestyle choices, including: Daily care of your teeth and gums. Regular physical activity. Eating a healthy diet. Avoiding tobacco and drug use. Limiting alcohol use. Practicing safe sex. Taking low doses of aspirin every day. Taking vitamin and mineral supplements as recommended by your health care provider. What happens during an annual well check? The services and screenings done by your health care provider during your annual well check will depend on your age,  overall health, lifestyle risk factors, and family history of disease. Counseling  Your health care provider may ask you questions about your: Alcohol use. Tobacco use. Drug use. Emotional well-being. Home and relationship well-being. Sexual activity. Eating habits. History of falls. Memory and ability to understand (cognition). Work and work Statistician. Screening  You may have the following tests or measurements: Height, weight, and BMI. Blood pressure. Lipid and cholesterol levels. These may be checked every 5 years, or more frequently if you are over 41 years old. Skin check. Lung cancer screening. You may have this screening every year starting at age 55 if you have a 30-pack-year history of smoking and currently smoke or have quit within the past 15 years. Fecal occult blood test (FOBT) of the stool. You may have this test every year starting at age 27. Flexible sigmoidoscopy or colonoscopy. You may have a sigmoidoscopy every 5 years or a colonoscopy every 10 years starting at age 39. Prostate cancer screening. Recommendations will vary depending on your family history and other risks. Hepatitis C blood test. Hepatitis B blood test. Sexually transmitted disease (STD) testing. Diabetes screening. This is done by checking your blood sugar (glucose) after you have not eaten for a while (fasting). You may have this done every 1-3 years. Abdominal aortic aneurysm (AAA) screening. You may need this if you are a current or former smoker. Osteoporosis. You may be screened starting at age 20 if you are at high risk. Talk with your health care provider about your test results, treatment options, and if necessary, the need for more tests. Vaccines  Your health care provider may recommend certain vaccines, such as: Influenza vaccine. This is recommended every year. Tetanus, diphtheria, and acellular pertussis (Tdap, Td) vaccine. You may need  a Td booster every 10 years. Zoster vaccine.  You may need this after age 20. Pneumococcal 13-valent conjugate (PCV13) vaccine. One dose is recommended after age 29. Pneumococcal polysaccharide (PPSV23) vaccine. One dose is recommended after age 70. Talk to your health care provider about which screenings and vaccines you need and how often you need them. This information is not intended to replace advice given to you by your health care provider. Make sure you discuss any questions you have with your health care provider. Document Released: 08/04/2015 Document Revised: 03/27/2016 Document Reviewed: 05/09/2015 Elsevier Interactive Patient Education  2017 [place] Prevention in the Home Falls can cause injuries. They can happen to people of all ages. There are many things you can do to make your home safe and to help prevent falls. What can I do on the outside of my home? Regularly fix the edges of walkways and driveways and fix any cracks. Remove anything that might make you trip as you walk through a door, such as a raised step or threshold. Trim any bushes or trees on the path to your home. Use bright outdoor lighting. Clear any walking paths of anything that might make someone trip, such as rocks or tools. Regularly check to see if handrails are loose or broken. Make sure that both sides of any steps have handrails. Any raised decks and porches should have guardrails on the edges. Have any leaves, snow, or ice cleared regularly. Use sand or salt on walking paths during winter. Clean up any spills in your garage right away. This includes oil or grease spills. What can I do in the bathroom? Use night lights. Install grab bars by the toilet and in the tub and shower. Do not use towel bars as grab bars. Use non-skid mats or decals in the tub or shower. If you need to sit down in the shower, use a plastic, non-slip stool. Keep the floor dry. Clean up any water that spills on the floor as soon as it happens. Remove soap  buildup in the tub or shower regularly. Attach bath mats securely with double-sided non-slip rug tape. Do not have throw rugs and other things on the floor that can make you trip. What can I do in the bedroom? Use night lights. Make sure that you have a light by your bed that is easy to reach. Do not use any sheets or blankets that are too big for your bed. They should not hang down onto the floor. Have a firm chair that has side arms. You can use this for support while you get dressed. Do not have throw rugs and other things on the floor that can make you trip. What can I do in the kitchen? Clean up any spills right away. Avoid walking on wet floors. Keep items that you use a lot in easy-to-reach places. If you need to reach something above you, use a strong step stool that has a grab bar. Keep electrical cords out of the way. Do not use floor polish or wax that makes floors slippery. If you must use wax, use non-skid floor wax. Do not have throw rugs and other things on the floor that can make you trip. What can I do with my stairs? Do not leave any items on the stairs. Make sure that there are handrails on both sides of the stairs and use them. Fix handrails that are broken or loose. Make sure that handrails are as long as the stairways. Check  any carpeting to make sure that it is firmly attached to the stairs. Fix any carpet that is loose or worn. Avoid having throw rugs at the top or bottom of the stairs. If you do have throw rugs, attach them to the floor with carpet tape. Make sure that you have a light switch at the top of the stairs and the bottom of the stairs. If you do not have them, ask someone to add them for you. What else can I do to help prevent falls? Wear shoes that: Do not have high heels. Have rubber bottoms. Are comfortable and fit you well. Are closed at the toe. Do not wear sandals. If you use a stepladder: Make sure that it is fully opened. Do not climb a closed  stepladder. Make sure that both sides of the stepladder are locked into place. Ask someone to hold it for you, if possible. Clearly mark and make sure that you can see: Any grab bars or handrails. First and last steps. Where the edge of each step is. Use tools that help you move around (mobility aids) if they are needed. These include: Canes. Walkers. Scooters. Crutches. Turn on the lights when you go into a dark area. Replace any light bulbs as soon as they burn out. Set up your furniture so you have a clear path. Avoid moving your furniture around. If any of your floors are uneven, fix them. If there are any pets around you, be aware of where they are. Review your medicines with your doctor. Some medicines can make you feel dizzy. This can increase your chance of falling. Ask your doctor what other things that you can do to help prevent falls. This information is not intended to replace advice given to you by your health care provider. Make sure you discuss any questions you have with your health care provider. Document Released: 05/04/2009 Document Revised: 12/14/2015 Document Reviewed: 08/12/2014 Elsevier Interactive Patient Education  2017 [employer].

## 2021-01-30 NOTE — Progress Notes (Signed)
Subjective:   Jorge Adams is a 70 y.o. male who presents for Medicare Annual/Subsequent preventive examination.  Review of Systems: N/A      I connected with the patient today by telephone and verified that I am speaking with the correct person using two identifiers. Location patient: home Location nurse: work Persons participating in the telephone visit: patient, nurse.   I discussed the limitations, risks, security and privacy concerns of performing an evaluation and management service by telephone and the availability of in person appointments. I also discussed with the patient that there may be a patient responsible charge related to this service. The patient expressed understanding and verbally consented to this telephonic visit.        Cardiac Risk Factors include: advanced age (>77mn, >>99women);male gender;diabetes mellitus;hypertension;Other (see comment), Risk factor comments: hyperlipidemia     Objective:    Today's Vitals   There is no height or weight on file to calculate BMI.  Advanced Directives 01/30/2021 09/09/2019 09/03/2018 05/07/2018 04/28/2018 08/25/2017 05/01/2016  Does Patient Have a Medical Advance Directive? _0  No No  Does patient want to make changes to medical advance directive? - No - Patient declined - - - - -  Would patient like information on creating a medical advance directive? No - Patient declined - No - Patient declined No - Patient declined No - Patient declined Yes (MAU/Ambulatory/Procedural Areas - Information given) No - patient declined information    Current Medications (verified) Outpatient Encounter Medications as of 01/30/2021  Medication Sig   amLODipine-benazepril (LOTREL) 5-20 MG capsule TAKE 1 CAPSULE BY MOUTH EVERY DAY   aspirin EC 81 MG tablet Take 1 tablet (81 mg total) by mouth daily.   blood glucose meter kit and supplies Dispense based on pt and insurance preference. Use up to 3 times daily as directed. (Dx.  E11.9).   Blood Pressure Monitoring (SPHYGMOMANOMETER) MISC Use daily to check BP.  Dx I10   brimonidine (ALPHAGAN P) 0.1 % SOLN Place 1 drop into both eyes 2 (two) times daily.   cilostazol (PLETAL) 100 MG tablet TAKE 1 TABLET BY MOUTH TWICE A DAY   COVID-19 mRNA Vac-TriS, Pfizer, (PFIZER-BIONT COVID-19 VAC-TRIS) SUSP injection Inject into the muscle.   dorzolamide-timolol (COSOPT) 22.3-6.8 MG/ML ophthalmic solution Place 1 drop into both eyes 2 (two) times daily.   fenofibrate (TRICOR) 145 MG tablet TAKE 1 TABLET BY MOUTH EVERY DAY IN THE EVENING   latanoprost (XALATAN) 0.005 % ophthalmic solution 1 drop at bedtime.   metFORMIN (GLUCOPHAGE) 500 MG tablet TAKE 1 TO 2 TABLETS BY MOUTH TWICE A DAY WITH A MEAL   metoprolol tartrate (LOPRESSOR) 25 MG tablet TAKE 1 TABLET BY MOUTH TWICE A DAY   naproxen (NAPROSYN) 500 MG tablet Take 1 tablet (500 mg total) by mouth 2 (two) times daily.   nitroGLYCERIN (NITROSTAT) 0.4 MG SL tablet Place 1 tablet (0.4 mg total) under the tongue every 5 (five) minutes as needed for chest pain.   omega-3 acid ethyl esters (LOVAZA) 1 g capsule TAKE 1 CAPSULE BY MOUTH 2 TIMES DAILY.   phenylephrine (NEO-SYNEPHRINE) 1 % nasal spray Place 1 drop into both nostrils at bedtime as needed for congestion.   RHOPRESSA 0.02 % SOLN Apply 1 drop to eye at bedtime.   rosuvastatin (CRESTOR) 20 MG tablet TAKE 1 TABLET BY MOUTH EVERY DAY   No facility-administered encounter medications on file as of 01/30/2021.    Allergies (verified) Patient has no known allergies.  History: Past Medical History:  Diagnosis Date   Arthritis    ASHD (arteriosclerotic heart disease)    Atherosclerosis of native arteries of the extremities with intermittent claudication    Cataract    Coronary atherosclerosis of native coronary artery    Diabetes mellitus    NIDDM   Dyslipidemia    ED (erectile dysfunction)    Full dentures    Glaucoma associated with ocular disorder, mild stage     bilateral   Hyperlipidemia    Hypertension    under control, has been on med. > 9 yr.   Impotence of organic origin    Meniscus tear 05/2012   right knee   Myocardial infarction [facility]) 10/2002   Stented coronary artery    Past Surgical History:  Procedure Laterality Date   ANTERIOR CERVICAL DECOMP/DISCECTOMY FUSION N/A 05/07/2018   Procedure: ANTERIOR CERVICAL DECOMPRESSION/DISCECTOMY FUSION CERVICAL FIVE - CERVICAL SIX;  Surgeon: Consuella Lose, MD;  Location: [place];  Service: Neurosurgery;  Laterality: N/A;  ANTERIOR CERVICAL DECOMPRESSION/DISCECTOMY FUSION CERVICAL FIVE - CERVICAL SIX   CARDIAC CATHETERIZATION  10/23/2002   had 1 stent placed then-- by Dr. Melvern Banker   CARDIAC CATHETERIZATION N/A 12/21/2015   Procedure: Left Heart Cath and Coronary Angiography;  Surgeon: Adrian Prows, MD;  Location: [place] CV [facility];  Service: Cardiovascular;  Laterality: N/A;   CATARACT EXTRACTION Right 05/2017   CATARACT EXTRACTION Left 07/2017   COLONOSCOPY     CORONARY ARTERY BYPASS GRAFT N/A 01/02/2016   Procedure: CORONARY ARTERY BYPASS GRAFTING (CABG) TIMES 3 USING LEFT INTERNAL MAMMARY ARTERY AND RIGHT SAPHENOUS LEG VEIN HARVESTED ENDOSCOPICALLY;  Surgeon: Ivin Poot, MD;  Location: [place];  Service: Open Heart Surgery;  Laterality: N/A;   INSERTION OF MESH N/A 05/06/2016   Procedure: INSERTION OF MESH;  Surgeon: Georganna Skeans, MD;  Location: [place];  Service: General;  Laterality: N/A;   KNEE ARTHROSCOPY WITH MEDIAL MENISECTOMY  06/04/2012   Procedure: KNEE ARTHROSCOPY WITH MEDIAL MENISECTOMY;  Surgeon: Ninetta Lights, MD;  Location: [place];  Service: Orthopedics;  Laterality: Right;  RIGHT SCOPE MEDIAL MENISCECTOMY, CHONDROPLASTY, EXCISION LOOSE BODY   LOWER EXTREMITY ANGIOGRAM N/A 11/23/2013   Procedure: LOWER EXTREMITY ANGIOGRAM;  Surgeon: Laverda Page, MD;  Location: [facility] CATH [facility];  Service: Cardiovascular;  Laterality: N/A;   NECK SURGERY N/A    02/19/2018   PERIPHERAL  VASCULAR CATHETERIZATION N/A 12/21/2015   Procedure: Abdominal Aortogram;  Surgeon: Adrian Prows, MD;  Location: [place] CV [facility];  Service: Cardiovascular;  Laterality: N/A;   PERIPHERAL VASCULAR CATHETERIZATION N/A 12/21/2015   Procedure: Abdominal Aortogram w/Lower Extremity;  Surgeon: Adrian Prows, MD;  Location: [place] [facility];  Service: Cardiovascular;  Laterality: N/A;   TEE WITHOUT CARDIOVERSION N/A 01/02/2016   Procedure: TRANSESOPHAGEAL ECHOCARDIOGRAM (TEE);  Surgeon: Ivin Poot, MD;  Location: [place];  Service: Open Heart Surgery;  Laterality: N/A;   UMBILICAL HERNIA REPAIR N/A 05/06/2016   Procedure: HERNIA REPAIR UMBILICAL ADULT;  Surgeon: Georganna Skeans, MD;  Location: [place];  Service: General;  Laterality: N/A;   Family History  Problem Relation Age of Onset   Bone cancer Mother    Pneumonia Father    Diabetes Brother    Heart disease Brother        Pacemaker   COPD Brother    Colon cancer Neg Hx    Prostate cancer Neg Hx    Social History   Socioeconomic History   Marital status: Married  Spouse name: Not on file   Number of children: 2   Years of education: Not on file   Highest education level: Not on file  Occupational History   Occupation: retired  Tobacco Use   Smoking status: Former    Packs/day: 2.50    Years: 35.00    Pack years: 87.50    Types: Cigarettes    Quit date: 12/02/2002    Years since quitting: 18.1   Smokeless tobacco: Never  Vaping Use   Vaping Use: Never used  Substance and Sexual Activity   Alcohol use: Yes    Comment: rare   Drug use: No   Sexual activity: Not on file  Other Topics Concern   Not on file  Social History Narrative   Retired Librarian, 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    [place] [employer], [employer], [employer]   Social Determinants of Health   Financial Resource Strain: Low Risk    Difficulty of Paying Living Expenses: Not hard at all   Food Insecurity: No Food Insecurity   Worried About Charity fundraiser in the Last Year: Never true   Arboriculturist in the Last Year: Never true  Transportation Needs: No Transportation Needs   Lack of Transportation (Medical): No   Lack of Transportation (Non-Medical): No  Physical Activity: Inactive   Days of Exercise per Week: 0 days   Minutes of Exercise per Session: 0 min  Stress: No Stress Concern Present   Feeling of Stress : Not at all  Social Connections: Not on file    Tobacco Counseling Counseling given: Not Answered   Clinical Intake:  Pre-visit preparation completed: Yes  Pain : 0-10 Pain Type: Chronic pain Pain Location: Elbow Pain Orientation: Right Pain Descriptors / Indicators: Aching Pain Onset: More than a month ago Pain Frequency: Intermittent     Nutritional Risks: None Diabetes: Yes CBG done?: No Did pt. bring in CBG monitor from home?: No  How often do you need to have someone help you when you read instructions, pamphlets, or other written materials from your doctor or pharmacy?: 1 - Never  Diabetic: Yes Nutrition Risk Assessment:  Has the patient had any N/V/D within the last 2 months?  No  Does the patient have any non-healing wounds?  No  Has the patient had any unintentional weight loss or weight gain?  No   Diabetes:  Is the patient diabetic?  Yes  If diabetic, was a CBG obtained today?  No  telephone visit  Did the patient bring in their glucometer from home?  No  telephone visit  How often do you monitor your CBG's? daily.   Financial Strains and Diabetes Management:  Are you having any financial strains with the device, your supplies or your medication? No .  Does the patient want to be seen by Chronic Care Management for management of their diabetes?  No  Would the patient like to be referred to a Nutritionist or for Diabetic Management?  No   Diabetic Exams:  Diabetic Eye Exam: Completed 10/31/2020 Diabetic Foot Exam:  Overdue, Pt has been advised about the importance in completing this exam.    Interpreter Needed?: No  Information entered by :: CJohnson, RN   Activities of Daily Living In your present state of health, do you have any difficulty performing the following activities: 01/30/2021  Hearing? N  Vision? N  Difficulty concentrating or making decisions? N  Walking or climbing stairs? N  Dressing or bathing? N  Doing errands, shopping? N  Preparing Food and eating ? N  Using the Toilet? N  In the past six months, have you accidently leaked urine? N  Do you have problems with loss of bowel control? N  Managing your Medications? N  Managing your Finances? N  Housekeeping or managing your Housekeeping? N  Some recent data might be hidden    Patient Care Team: [name], MD as PCP - General (Family Medicine) Adrian Prows, MD as Consulting Physician (Cardiology) [place], Heather, [place] as Referring Physician (Optometry) Warden Fillers, MD as Consulting Physician (Ophthalmology) Debbora Dus, [facility] as Pharmacist (Pharmacist)  Indicate any recent Medical Services you may have received from other than [facility] providers in the past year (date may be approximate).     Assessment:   This is a routine wellness examination for [place].  Hearing/Vision screen Vision Screening - Comments:: Patient gets annual eye exmas   Dietary issues and exercise activities discussed: Current Exercise Habits: The patient does not participate in regular exercise at present, Exercise limited by: None identified   Goals Addressed             This Visit's Progress    Patient Stated       01/30/2021, I will maintain and continue medications as prescribed.         Depression Screen PHQ 2/9 Scores 01/30/2021 09/09/2019 09/03/2018 08/25/2017 04/30/2016 02/20/2016 05/29/2012  PHQ - 2 Score 0 0 0 0 0 0 0  PHQ- 9 Score 0 0 0 0 0 0 -    Fall Risk Fall Risk  01/30/2021 09/09/2019 09/03/2018 08/25/2017 02/20/2016   Falls in the past year? 0 0 1 No No  Number falls in past yr: 0 0 0 - -  Injury with Fall? 0 0 1 - -  Risk for fall due to : Medication side effect Medication side effect - - -  Follow up Falls evaluation completed;Falls prevention discussed Falls evaluation completed;Falls prevention discussed - - -    FALL RISK PREVENTION PERTAINING TO THE HOME:  Any stairs in or around the home? Yes  If so, are there any without handrails? No  Home free of loose throw rugs in walkways, pet beds, electrical cords, etc? Yes  Adequate lighting in your home to reduce risk of falls? Yes   ASSISTIVE DEVICES UTILIZED TO PREVENT FALLS:  Life alert? No  Use of a cane, walker or w/c? No  Grab bars in the bathroom? No  Shower chair or bench in shower? No  Elevated toilet seat or a handicapped toilet? No   TIMED UP AND GO:  Was the test performed?  N/A telephone visit  .  Cognitive Function: MMSE - Mini Mental State Exam 01/30/2021 09/09/2019 09/03/2018 08/25/2017  Orientation to time _0 Orientation to Place _1 Registration _2 Attention/ Calculation 5 5 0 0  Recall _3 Recall-comments - - - unable to recall 1 of 3 words  Language- name 2 objects - - 0 0  Language- repeat _4 Language- follow 3 step command - - 3 3  Language- read & follow direction - - 0 0  Write a sentence - - 0 0  Copy design - - 0 0  Total score - - 20 19  Mini Cog  Mini-Cog screen  was completed. Maximum score is 22. A value of 0 denotes this part of the MMSE was not completed or the patient failed this part of the Mini-Cog screening.       Immunizations Immunization History  Administered Date(s) Administered   Fluad Quad(high Dose 65+) 05/20/2019, 05/11/2020   Influenza Split 05/01/2012, 04/21/2013   Influenza,inj,Quad PF,6+ Mos 06/06/2015, 06/24/2016, 04/25/2017, 06/09/2018   Influenza-Unspecified 04/27/2014   PFIZER Comirnaty(Gray Top)Covid-19 Tri-Sucrose Vaccine 10/31/2020   PFIZER(Purple  Top)SARS-COV-2 Vaccination 08/29/2019, 09/21/2019, 05/15/2020   Pneumococcal Conjugate-13 12/02/2014   Pneumococcal Polysaccharide-23 09/10/2010, 12/04/2015   Tdap 09/10/2010   Zoster Recombinat (Shingrix) 03/18/2019, 07/05/2019   Zoster, Live 09/10/2010    TDAP status: Due, Education has been provided regarding the importance of this vaccine. Advised may receive this vaccine at local pharmacy or Health Dept. Aware to provide a copy of the vaccination record if obtained from local pharmacy or Health Dept. Verbalized acceptance and understanding.  Flu Vaccine status: Up to date  Pneumococcal vaccine status: Up to date  Covid-19 vaccine status: Completed vaccines  Qualifies for Shingles Vaccine? Yes   Zostavax completed Yes   Shingrix Completed?: Yes  Screening Tests Health Maintenance  Topic Date Due   COLONOSCOPY (Pts 45-72yrs Insurance coverage will need to be confirmed)  10/12/2015   FOOT EXAM  09/16/2020   TETANUS/TDAP  01/31/2024 (Originally 09/10/2020)   INFLUENZA VACCINE  02/19/2021   HEMOGLOBIN A1C  03/11/2021   OPHTHALMOLOGY EXAM  10/31/2021   COVID-19 Vaccine  Completed   Hepatitis C Screening  Completed   PNA vac Low Risk Adult  Completed   Zoster Vaccines- Shingrix  Completed   HPV VACCINES  Aged Out    Health Maintenance  Health Maintenance Due  Topic Date Due   COLONOSCOPY (Pts 45-4yrs Insurance coverage will need to be confirmed)  10/12/2015   FOOT EXAM  09/16/2020    Colorectal cancer screening: due, will discuss with provider   Lung Cancer Screening: (Low Dose CT Chest recommended if Age 77-80 years, 30 pack-year currently smoking OR have quit w/in 15 years.) does not qualify.   Additional Screening:  Hepatitis C Screening: does qualify; Completed 11/29/2015  Vision Screening: Recommended annual ophthalmology exams for early detection of glaucoma and other disorders of the eye. Is the patient up to date with their annual eye exam?  Yes  Who is the  provider or what is the name of the office in which the patient attends annual eye exams? Dr. [name]  If pt is not established with a provider, would they like to be referred to a provider to establish care? No .   Dental Screening: Recommended annual dental exams for proper oral hygiene  Community Resource Referral / Chronic Care Management: CRR required this visit?  No   CCM required this visit?  No      Plan:     I have personally reviewed and noted the following in the patient's chart:   Medical and social history Use of alcohol, tobacco or illicit drugs  Current medications and supplements including opioid prescriptions. Patient is not currently taking opioid prescriptions. Functional ability and status Nutritional status Physical activity Advanced directives List of other physicians Hospitalizations, surgeries, and ER visits in previous 12 months Vitals Screenings to include cognitive, depression, and falls Referrals and appointments  In addition, I have reviewed and discussed with patient certain preventive protocols, quality metrics, and best practice recommendations. A written personalized care plan for preventive services as well as general preventive health  recommendations were provided to patient.   Due to this being a telephonic visit, the after visit summary with patients personalized plan was offered to patient via office or my-chart. Patient preferred to pick up at office at next visit or via mychart.   Andrez Grime, LPN   [id number]/0029

## 2021-01-31 ENCOUNTER — Telehealth: Payer: Self-pay | Admitting: Family Medicine

## 2021-01-31 NOTE — Telephone Encounter (Signed)
Please schedule yearly visit for the fall.  We can do labs at the visit.  Thanks.

## 2021-02-05 NOTE — Telephone Encounter (Signed)
Spoke with patient scheduled CPE// same day lab

## 2021-02-06 ENCOUNTER — Telehealth: Payer: Self-pay

## 2021-02-06 NOTE — Chronic Care Management (AMB) (Addendum)
Chronic Care Management Pharmacy Assistant   Name: Jorge Adams  MRN: [id number] DOB: 10-Dec-1950  Reason for Encounter: Disease State General Adherence   Recent office visits:  None since last CCM contact  Recent consult visits:  None since last CCM contact  Hospital visits:  01/08/21- [facility]  Olecranon Bursitis started Naproxen 558m take 1 tablet 2 times daily   Medications: Outpatient Encounter Medications as of 02/06/2021  Medication Sig   amLODipine-benazepril (LOTREL) 5-20 MG capsule TAKE 1 CAPSULE BY MOUTH EVERY DAY   aspirin EC 81 MG tablet Take 1 tablet (81 mg total) by mouth daily.   blood glucose meter kit and supplies Dispense based on pt and insurance preference. Use up to 3 times daily as directed. (Dx. E11.9).   Blood Pressure Monitoring (SPHYGMOMANOMETER) MISC Use daily to check BP.  Dx I10   brimonidine (ALPHAGAN P) 0.1 % SOLN Place 1 drop into both eyes 2 (two) times daily.   cilostazol (PLETAL) 100 MG tablet TAKE 1 TABLET BY MOUTH TWICE A DAY   COVID-19 mRNA Vac-TriS, Pfizer, (PFIZER-BIONT COVID-19 VAC-TRIS) SUSP injection Inject into the muscle.   dorzolamide-timolol (COSOPT) 22.3-6.8 MG/ML ophthalmic solution Place 1 drop into both eyes 2 (two) times daily.   fenofibrate (TRICOR) 145 MG tablet TAKE 1 TABLET BY MOUTH EVERY DAY IN THE EVENING   latanoprost (XALATAN) 0.005 % ophthalmic solution 1 drop at bedtime.   metFORMIN (GLUCOPHAGE) 500 MG tablet TAKE 1 TO 2 TABLETS BY MOUTH TWICE A DAY WITH A MEAL   metoprolol tartrate (LOPRESSOR) 25 MG tablet TAKE 1 TABLET BY MOUTH TWICE A DAY   naproxen (NAPROSYN) 500 MG tablet Take 1 tablet (500 mg total) by mouth 2 (two) times daily.   nitroGLYCERIN (NITROSTAT) 0.4 MG SL tablet Place 1 tablet (0.4 mg total) under the tongue every 5 (five) minutes as needed for chest pain.   omega-3 acid ethyl esters (LOVAZA) 1 g capsule TAKE 1 CAPSULE BY MOUTH 2 TIMES DAILY.   phenylephrine (NEO-SYNEPHRINE) 1 %  nasal spray Place 1 drop into both nostrils at bedtime as needed for congestion.   RHOPRESSA 0.02 % SOLN Apply 1 drop to eye at bedtime.   rosuvastatin (CRESTOR) 20 MG tablet TAKE 1 TABLET BY MOUTH EVERY DAY   No facility-administered encounter medications on file as of 02/06/2021.   Contacted DLuanna Salkto review adherence to medications.  Patient is not > 5 days past due for refill on the following medications per chart history:  Drug Name / Strength / Sig Metformin 5082mtake 2 tablets twice a day   Patient is aware I am calling to review medications that show they are past due per insurance data.  Drug name metformin 50059mndicated for diabetes.   How are you taking your [Metformin 500m16m  As prescribed    How many tablets per day? 4 tablets   What time of day? 2 at breakfast and 2 at dinner   What concerns do you have about this medication? No concerns identified    How often do you forget or accidentally miss a dose? Not recently but he has    Do you use a pillbox? Yes he puts one together first of each week    Are you having any problems getting this medication from your pharmacy? No   Has the cost of this medication been a concern? No    When was the prescription last filled? Metformin 500mg73m2/22 for 90ds  per [facility]  Verified the patient does not have any expired medication (bottles > 64 year old).   Do you have excess medication on hand? Yes the patient reports he still has some Jardiance    Counseled patient on:  [place] job taking medications, Importance of taking medication daily without missed doses, Benefits of adherence packaging or a pillbox, and Access to CCM team for any cost, medication or pharmacy concerns.  [facility] Medications Name   Last fill date   DS Metformin 518m 01/10/21 90 Rosuvastatin 235m5/12/22 90 Lotrel 5-2056m4/7/22  90  PCP appointment on 04/26/21, CCM appointment on 03/05/21, and Cardiology appointment on  04/19/21 MicDebbora DusPP notified  VelAvel SensorCMWavessistant [phone] have reviewed the care management and care coordination activities outlined in this encounter and I am certifying that I agree with the content of this note. Lotrel past due.. will follow up.  MicDebbora DusharmD Clinical Pharmacist [phone]

## 2021-02-08 ENCOUNTER — Other Ambulatory Visit: Payer: Self-pay | Admitting: Cardiology

## 2021-02-08 DIAGNOSIS — E7800 Pure hypercholesterolemia, unspecified: Secondary | ICD-10-CM

## 2021-02-14 DIAGNOSIS — L03113 Cellulitis of right upper limb: Secondary | ICD-10-CM | POA: Diagnosis not present

## 2021-02-22 ENCOUNTER — Telehealth: Payer: Medicare HMO

## 2021-02-28 ENCOUNTER — Telehealth: Payer: Self-pay

## 2021-02-28 NOTE — Chronic Care Management (AMB) (Addendum)
    Chronic Care Management Pharmacy Assistant   Name: Jorge Adams  MRN: [id number] DOB: 1951-01-31  Reason for Encounter: CCM Reminder Call   Conditions to be addressed/monitored: CAD, HTN, HLD, and DMII   Medications: Outpatient Encounter Medications as of 02/28/2021  Medication Sig   amLODipine-benazepril (LOTREL) 5-20 MG capsule TAKE 1 CAPSULE BY MOUTH EVERY DAY   aspirin EC 81 MG tablet Take 1 tablet (81 mg total) by mouth daily.   blood glucose meter kit and supplies Dispense based on pt and insurance preference. Use up to 3 times daily as directed. (Dx. E11.9).   Blood Pressure Monitoring (SPHYGMOMANOMETER) MISC Use daily to check BP.  Dx I10   brimonidine (ALPHAGAN P) 0.1 % SOLN Place 1 drop into both eyes 2 (two) times daily.   cilostazol (PLETAL) 100 MG tablet TAKE 1 TABLET BY MOUTH TWICE A DAY   COVID-19 mRNA Vac-TriS, Pfizer, (PFIZER-BIONT COVID-19 VAC-TRIS) SUSP injection Inject into the muscle.   dorzolamide-timolol (COSOPT) 22.3-6.8 MG/ML ophthalmic solution Place 1 drop into both eyes 2 (two) times daily.   fenofibrate (TRICOR) 145 MG tablet TAKE 1 TABLET BY MOUTH EVERY DAY IN THE EVENING   latanoprost (XALATAN) 0.005 % ophthalmic solution 1 drop at bedtime.   metFORMIN (GLUCOPHAGE) 500 MG tablet TAKE 1 TO 2 TABLETS BY MOUTH TWICE A DAY WITH A MEAL   metoprolol tartrate (LOPRESSOR) 25 MG tablet TAKE 1 TABLET BY MOUTH TWICE A DAY   naproxen (NAPROSYN) 500 MG tablet Take 1 tablet (500 mg total) by mouth 2 (two) times daily.   nitroGLYCERIN (NITROSTAT) 0.4 MG SL tablet Place 1 tablet (0.4 mg total) under the tongue every 5 (five) minutes as needed for chest pain.   omega-3 acid ethyl esters (LOVAZA) 1 g capsule TAKE 1 CAPSULE BY MOUTH 2 TIMES DAILY.   phenylephrine (NEO-SYNEPHRINE) 1 % nasal spray Place 1 drop into both nostrils at bedtime as needed for congestion.   RHOPRESSA 0.02 % SOLN Apply 1 drop to eye at bedtime.   rosuvastatin (CRESTOR) 20 MG tablet TAKE 1  TABLET BY MOUTH EVERY DAY   No facility-administered encounter medications on file as of 02/28/2021.    Unable to reach Luanna Salk by telephone for a reminder of his upcoming telephone visit with Debbora Dus on 03/05/2021 at 2:00pm. Left voicemail to remind him to have all medications, supplements and any blood glucose and blood pressure readings available for review at appointment.  Star Rating Drugs: Medication:  Last Fill: Day Supply Metformin $RemoveBeforeDE'500mg'[id number]$         01/09/21            90 Rosuvastatin $RemoveBeforeD'20mg'mePLNeUWfhnmuz$      11/30/20            90 Lotrel 5-$RemoveBef'20mg'XcjNeNGGJw$              01/09/21             [place], CPP notified  [name], [place] Assistant [phone]  I have reviewed the care management and care coordination activities outlined in this encounter and I am certifying that I agree with the content of this note. No further action required.  Debbora Dus, PharmD Clinical Pharmacist [facility] at [facility] [phone]

## 2021-03-05 ENCOUNTER — Other Ambulatory Visit: Payer: Self-pay

## 2021-03-05 ENCOUNTER — Ambulatory Visit (INDEPENDENT_AMBULATORY_CARE_PROVIDER_SITE_OTHER): Payer: Medicare HMO

## 2021-03-05 DIAGNOSIS — I152 Hypertension secondary to endocrine disorders: Secondary | ICD-10-CM | POA: Diagnosis not present

## 2021-03-05 DIAGNOSIS — E1159 Type 2 diabetes mellitus with other circulatory complications: Secondary | ICD-10-CM

## 2021-03-05 NOTE — Progress Notes (Signed)
Chronic Care Management Pharmacy Note  03/05/21 Name:  Jorge Adams MRN:  [id number] DOB:  1950/12/16  Subjective: Jorge Adams is an 70 y.o. year old male who is a primary patient of Damita Dunnings, Elveria Rising, MD.  The CCM team was consulted for assistance with disease management and care coordination needs.    Engaged with patient by telephone for follow up visit in response to provider referral for pharmacy case management and/or care coordination services.   Consent to Services:  The patient was given information about Chronic Care Management services, agreed to services, and gave verbal consent prior to initiation of services.  Please see initial visit note for detailed documentation.   Patient Care Team: [name], MD as PCP - General (Family Medicine) Adrian Prows, MD as Consulting Physician (Cardiology) [place], [place], [place] as Referring Physician (Optometry) Warden Fillers, MD as Consulting Physician (Ophthalmology) Debbora Dus, [facility] as Pharmacist (Pharmacist)  Recent office visits:  None since last CCM contact   Recent consult visits:  None since last CCM contact   Hospital visits:  01/08/21 - [facility] - Olecranon Bursitis started Naproxen 557m take 1 tablet 2 times daily    Objective:  Lab Results  Component Value Date   CREATININE 0.74 09/11/2020   BUN 9 09/11/2020   GFR 91.33 09/11/2020   GFRNONAA >60 05/08/2018   GFRAA >60 05/08/2018   NA 137 09/11/2020   K 4.2 09/11/2020   CALCIUM 9.7 09/11/2020   CO2 26 09/11/2020   GLUCOSE 132 (H) 09/11/2020    Lab Results  Component Value Date/Time   HGBA1C 6.4 09/11/2020 07:44 AM   HGBA1C 6.6 (A) 05/11/2020 08:18 AM   HGBA1C 6.7 (A) 12/16/2019 08:21 AM   HGBA1C 7.7 (H) 09/14/2019 08:22 AM   GFR 91.33 09/11/2020 07:44 AM   GFR 92.82 09/14/2019 08:22 AM   MICROALBUR >300.0 05/27/2013 08:35 AM   MICROALBUR 6.8 05/29/2012 08:24 AM    Last diabetic Eye exam:  Lab Results  Component  Value Date/Time   HMDIABEYEEXA No Retinopathy 01/08/2017 12:00 AM    Last diabetic Foot exam: 09/17/19 - normal    Lab Results  Component Value Date   CHOL 132 09/11/2020   HDL 44.80 09/11/2020   LDLCALC 58 09/11/2020   LDLDIRECT 70.0 11/29/2015   TRIG 147.0 09/11/2020   CHOLHDL 3 09/11/2020    Hepatic Function Latest Ref Rng & Units 09/11/2020 09/14/2019 09/03/2018  Total Protein 6.0 - 8.3 g/dL 7.1 7.1 7.0  Albumin 3.5 - 5.2 g/dL 4.1 4.3 4.1  AST 0 - 37 U/L 15 20 16   ALT 0 - 53 U/L 15 26 18   Alk Phosphatase 39 - 117 U/L 49 52 52  Total Bilirubin 0.2 - 1.2 mg/dL 0.6 0.6 0.6  Bilirubin, Direct 0.0 - 0.3 mg/dL - - -    Lab Results  Component Value Date/Time   TSH 2.68 03/13/2018 08:59 AM    CBC Latest Ref Rng & Units 05/08/2018 04/28/2018 03/13/2018  WBC 4.0 - 10.5 K/uL 10.8(H) 6.2 5.5  Hemoglobin 13.0 - 17.0 g/dL 14.2 14.1 14.5  Hematocrit 39.0 - 52.0 % 42.5 42.4 42.3  Platelets 150 - 400 K/uL 166 152 153.0    Lab Results  Component Value Date/Time   VD25OH 47.52 03/13/2018 08:59 AM   VD25OH 30 01/28/2011 10:43 AM    Clinical ASCVD: Yes  The ASCVD Risk score (Mikey BussingDC Jr., et al., 2013) failed to calculate for the following reasons:   The  systolic blood pressure is missing    Depression screen [facility] 2/9 01/30/2021 09/09/2019 09/03/2018  Decreased Interest 0 0 0  Down, Depressed, Hopeless 0 0 0  PHQ - 2 Score 0 0 0  Altered sleeping 0 0 0  Tired, decreased energy 0 0 0  Change in appetite 0 0 0  Feeling bad or failure about yourself  0 0 0  Trouble concentrating 0 0 0  Moving slowly or fidgety/restless 0 0 0  Suicidal thoughts 0 0 0  PHQ-9 Score 0 0 0  Difficult doing work/chores Not difficult at all Not difficult at all Not difficult at all    Social History   Tobacco Use  Smoking Status Former   Packs/day: 2.50   Years: 35.00   Pack years: 87.50   Types: Cigarettes   Quit date: 12/02/2002   Years since quitting: 18.2  Smokeless Tobacco Never   BP Readings  from Last 3 Encounters:  01/08/21 126/80  05/11/20 112/72  04/17/20 125/78   Pulse Readings from Last 3 Encounters:  01/08/21 73  05/11/20 79  04/17/20 74   Wt Readings from Last 3 Encounters:  05/11/20 215 lb 1 oz (97.6 kg)  04/17/20 219 lb (99.3 kg)  12/16/19 218 lb (98.9 kg)   BMI Readings from Last 3 Encounters:  05/11/20 30.86 kg/m  04/17/20 31.42 kg/m  12/16/19 31.28 kg/m    Assessment/Interventions: Review of patient past medical history, allergies, medications, health status, including review of consultants reports, laboratory and other test data, was performed as part of comprehensive evaluation and provision of chronic care management services.   SDOH:  (Social Determinants of Health) assessments and interventions performed: Yes  SDOH Screenings   Alcohol Screen: Low Risk    Last Alcohol Screening Score (AUDIT): 1  Depression (PHQ2-9): Low Risk    PHQ-2 Score: 0  Financial Resource Strain: Low Risk    Difficulty of Paying Living Expenses: Not hard at all  Food Insecurity: No Food Insecurity   Worried About Charity fundraiser in the Last Year: Never true   Ran Out of Food in the Last Year: Never true  Housing: Low Risk    Last Housing Risk Score: 0  Physical Activity: Inactive   Days of Exercise per Week: 0 days   Minutes of Exercise per Session: 0 min  Social Connections: Not on file  Stress: No Stress Concern Present   Feeling of Stress : Not at all  Tobacco Use: Medium Risk   Smoking Tobacco Use: Former   Smokeless Tobacco Use: Never  Transportation Needs: No Data processing manager (Medical): No   Lack of Transportation (Non-Medical): No    CCM Care Plan  No Known Allergies  Medications Reviewed Today     Reviewed by Debbora Dus, [facility] (Pharmacist) on 03/06/21 at 1335  Med List Status: <None>   Medication Order Taking? Sig Documenting Provider Last Dose Status Informant  amLODipine-benazepril (LOTREL) 5-20 MG  capsule [id number]  TAKE 1 CAPSULE BY MOUTH EVERY DAY Adrian Prows, MD  Active   aspirin EC 81 MG tablet [id number]  Take 1 tablet (81 mg total) by mouth daily. Traci Sermon, PA-C  Active   blood glucose meter kit and supplies [id number]  Dispense based on pt and insurance preference. Use up to 3 times daily as directed. (Dx. E11.9). [name], MD  Active   Blood Pressure Monitoring [facility]) MISC [id number]  Use daily to check BP.  Dx [place] Damita Dunnings,  Elveria Rising, MD  Active   brimonidine (ALPHAGAN P) 0.1 % SOLN [id number]  Place 1 drop into both eyes 2 (two) times daily. [provider]  Active Self  cilostazol (PLETAL) 100 MG tablet [id number]  TAKE 1 TABLET BY MOUTH TWICE A DAY [name], MD  Active   COVID-19 mRNA Vac-TriS, Pfizer, (PFIZER-BIONT COVID-19 VAC-TRIS) SUSP injection [id number]  Inject into the muscle. Carlyle Basques, MD  Active   dorzolamide-timolol (COSOPT) 22.3-6.8 MG/ML ophthalmic solution [id number]  Place 1 drop into both eyes 2 (two) times daily. [provider]  Active Self  fenofibrate (TRICOR) 145 MG tablet [id number]  TAKE 1 TABLET BY MOUTH EVERY DAY IN THE Noah Charon, MD  Active   latanoprost (XALATAN) 0.005 % ophthalmic solution [id number]  1 drop at bedtime. [provider]  Active   metFORMIN (GLUCOPHAGE) 500 MG tablet [id number]  TAKE 1 TO 2 TABLETS BY MOUTH TWICE A DAY WITH A MEAL [name], MD  Active   metoprolol tartrate (LOPRESSOR) 25 MG tablet [id number]  TAKE 1 TABLET BY MOUTH TWICE A DAY Adrian Prows, MD  Active   naproxen (NAPROSYN) 500 MG tablet [id number]  Take 1 tablet (500 mg total) by mouth 2 (two) times daily. Sharion Balloon, NP  Active   nitroGLYCERIN (NITROSTAT) 0.4 MG SL tablet [id number]  Place 1 tablet (0.4 mg total) under the tongue every 5 (five) minutes as needed for chest pain. Adrian Prows, MD  Active   omega-3 acid ethyl esters (LOVAZA) 1 g capsule [id number]  TAKE 1 CAPSULE BY MOUTH 2 TIMES DAILY.  Adrian Prows, MD  Active   Patient not taking:  Discontinued 03/06/21 1335 (No longer needed (for PRN medications)) RHOPRESSA 0.02 % SOLN [id number]  Apply 1 drop to eye at bedtime. [provider]  Active   rosuvastatin (CRESTOR) 20 MG tablet [id number]  TAKE 1 TABLET BY MOUTH EVERY DAY Adrian Prows, MD  Active             Patient Active Problem List   Diagnosis Date Noted   Stenosis of cervical spine with myelopathy ([place]) 05/07/2018   Paresthesia 03/15/2018   Health care maintenance 09/04/2017   Snoring 50/53/9767   Umbilical hernia 34/19/3790   CAD (coronary artery disease) 01/02/2016   Angina pectoris ([place]) 12/18/2015   Routine general medical examination at a health care facility 12/07/2014   Advance care planning 12/07/2014   Atherosclerosis of native arteries of the extremities with intermittent claudication 12/05/2013   Erectile dysfunction associated with type 2 diabetes mellitus ([place]) 10/31/2013   Type 2 diabetes mellitus with vascular disease ([place]) 10/31/2013   Senile nuclear sclerosis 06/04/2013   Borderline glaucoma with ocular hypertension 06/04/2013   Amblyopia, unspecified 06/04/2013   ASHD (arteriosclerotic heart disease) 01/28/2011   Hypogonadism, male 01/28/2011   Hypertension associated with diabetes ([place]) 01/28/2011   Hyperlipidemia LDL goal <70 01/28/2011   Obesity (BMI 30-39.9) 01/28/2011    Immunization History  Administered Date(s) Administered   Fluad Quad(high Dose 65+) 05/20/2019, 05/11/2020   Influenza Split 05/01/2012, 04/21/2013   Influenza,inj,Quad PF,6+ Mos 06/06/2015, 06/24/2016, 04/25/2017, 06/09/2018   Influenza-Unspecified 04/27/2014   PFIZER Comirnaty(Gray Top)Covid-19 Tri-Sucrose Vaccine 10/31/2020   PFIZER(Purple Top)SARS-COV-2 Vaccination 08/29/2019, 09/21/2019, 05/15/2020   Pneumococcal Conjugate-13 12/02/2014   Pneumococcal Polysaccharide-23 09/10/2010, 12/04/2015   Tdap 09/10/2010   Zoster Recombinat (Shingrix) 03/18/2019,  07/05/2019   Zoster, Live 09/10/2010    Conditions to be addressed/monitored:  Hypertension and Diabetes  Care Plan : [place]  Care Plan  Updates made by Debbora Dus, [facility] since 03/06/2021 12:00 AM     Problem: Disease Management   Onset Date: 03/05/2021  Note:    Current Barriers:  None identified  Pharmacist Clinical Goal(s):  Patient will contact provider office for questions/concerns as evidenced notation of same in electronic health record through collaboration with PharmD and provider.   Interventions: 1:1 collaboration with [name], MD regarding development and update of comprehensive plan of care as evidenced by provider attestation and co-signature Inter-disciplinary care team collaboration (see longitudinal plan of care) Comprehensive medication review performed; medication list updated in electronic medical record  Hypertension (BP goal <130/80) -Controlled - clinic and home readings within goal -Managed by cardiology (f/u scheduled Sept 22) -Current treatment: Amlodipine-benazepril 5-20 mg - 1 daily Metoprolol tartrate 25 mg - 1 tablet twice daily -Medications previously tried: none reported  -Checks home BP 1-2 times/month -Current home readings: today 122/73, 72 (digital arm monitor) -Denies hypotensive/hypertensive symptoms -Educated on BP goals and benefits of medications for prevention of heart attack, stroke and kidney damage; -Counseled to monitor BP at home monthly, document, and provide log at future appointments -Refills timely -Recommended to continue current medication  Diabetes (A1c goal <7%) -Controlled - A1c within goal, fasting BG within goal -Current medications: Metformin 500 mg - 2 tablets twice daily  -Medications previously tried: Jardiance  -Current home glucose readings - checks daily  fasting glucose: 119 (today) post prandial glucose: none -Denies hypoglycemic/hyperglycemic symptoms -Current exercise: stays  active -Educated on A1c and blood sugar goals; Counseled to check feet daily and get yearly eye exams - foot and eye exam are due  -Recommended to continue current medication  Patient Goals/Self-Care Activities Patient will:  - schedule dilated eye exam and check your feet daily  Follow Up Plan: Telephone follow up appointment with care management team member scheduled for:  12 months -DM and HTN review in 6 months -PCP October 2022    Medication Assistance: None required.  Patient affirms current coverage meets needs.  Compliance/Adherence/Medication fill history: Care Gaps: Annual foot exam due - recommend complete at physical 10/6 Colonoscopy - recommend scheduling   Star-Rating Drugs: Medication:                Last Fill:         Day Supply Metformin 572m        01/09/21            90 Rosuvastatin 252m    11/30/20            90 Lotrel 5-2041m           01/09/21             90  Patient's preferred pharmacy is:  [id number] [id number]: [phone]: [phone] pill box? Yes - fills weekly  Pt endorses 100% compliance  Care Plan and Follow Up Patient Decision:  Patient agrees to Care Plan and Follow-up.  MichDebbora DusarmD Clinical Pharmacist [phone]

## 2021-03-06 NOTE — Patient Instructions (Signed)
Dear Jorge Adams,  Below is a summary of the goals we discussed during our follow up appointment on March 05, 2021. Please contact me anytime with questions or concerns.   Visit Information  Patient Care Plan: CCM Pharmacy Care Plan     Problem Identified: Disease Management   Onset Date: 03/05/2021  Note:    Current Barriers:  None identified  Pharmacist Clinical Goal(s):  Patient will contact provider office for questions/concerns as evidenced notation of same in electronic health record through collaboration with PharmD and provider.   Interventions: 1:1 collaboration with [name], MD regarding development and update of comprehensive plan of care as evidenced by provider attestation and co-signature Inter-disciplinary care team collaboration (see longitudinal plan of care) Comprehensive medication review performed; medication list updated in electronic medical record  Hypertension (BP goal <130/80) -Controlled - clinic and home readings within goal -Managed by cardiology (f/u scheduled Sept 22) -Current treatment: Amlodipine-benazepril 5-20 mg - 1 daily Metoprolol tartrate 25 mg - 1 tablet twice daily -Medications previously tried: none reported  -Checks home BP 1-2 times/month -Current home readings: today 122/73, 72 (digital arm monitor) -Denies hypotensive/hypertensive symptoms -Educated on BP goals and benefits of medications for prevention of heart attack, stroke and kidney damage; -Counseled to monitor BP at home monthly, document, and provide log at future appointments -Refills timely -Recommended to continue current medication  Diabetes (A1c goal <7%) -Controlled - A1c within goal, fasting BG within goal -Current medications: Metformin 500 mg - 2 tablets twice daily  -Medications previously tried: Jardiance  -Current home glucose readings - checks daily  fasting glucose: 119 (today) post prandial glucose: none -Denies  hypoglycemic/hyperglycemic symptoms -Current exercise: stays active -Educated on A1c and blood sugar goals; Counseled to check feet daily and get yearly eye exams - foot and eye exam are due  -Recommended to continue current medication  Patient Goals/Self-Care Activities Patient will:  - schedule dilated eye exam and check your feet daily  Follow Up Plan: Telephone follow up appointment with care management team member scheduled for:  12 months -DM and HTN review in 6 months -PCP October 2022      Patient verbalizes understanding of instructions provided today and agrees to view in [place].   Debbora Dus, PharmD Clinical Pharmacist [place] [facility] at [facility] [phone]

## 2021-03-12 DIAGNOSIS — E119 Type 2 diabetes mellitus without complications: Secondary | ICD-10-CM | POA: Diagnosis not present

## 2021-03-12 LAB — HM DIABETES EYE EXAM

## 2021-04-19 ENCOUNTER — Encounter: Payer: Self-pay | Admitting: Cardiology

## 2021-04-19 ENCOUNTER — Other Ambulatory Visit: Payer: Self-pay

## 2021-04-19 ENCOUNTER — Ambulatory Visit: Payer: Medicare HMO | Admitting: Cardiology

## 2021-04-19 VITALS — BP 152/83 | HR 66 | Temp 97.7°F | Resp 17 | Ht 70.0 in | Wt 200.8 lb

## 2021-04-19 DIAGNOSIS — I10 Essential (primary) hypertension: Secondary | ICD-10-CM | POA: Diagnosis not present

## 2021-04-19 DIAGNOSIS — I2510 Atherosclerotic heart disease of native coronary artery without angina pectoris: Secondary | ICD-10-CM | POA: Diagnosis not present

## 2021-04-19 DIAGNOSIS — I739 Peripheral vascular disease, unspecified: Secondary | ICD-10-CM | POA: Diagnosis not present

## 2021-04-19 DIAGNOSIS — E7800 Pure hypercholesterolemia, unspecified: Secondary | ICD-10-CM

## 2021-04-19 NOTE — Progress Notes (Signed)
Primary Physician/Referring:  [name], MD  Patient ID: Jorge Adams, male    DOB: [birth year], 70 y.o.   MRN: [id number]  Chief Complaint  Patient presents with   Follow-up   Coronary Artery Disease   HPI:    HPI: Jorge Adams  is a 70 y.o. coronary artery disease status post CABG 3 in 12/2015, known peripheral artery disease (angiography in 2015 revealing bilateral SFA cauliflower-like severe diffuse mixed atheresclerotic lesions with bilateral severe disease in the anterior and peroneal arteries), no significant disease was noted in the iliac vessels. He has type II diagnosed mellitus controlled, mixed hyperlipidemia and hypertension.  He is presently doing well and has mild dyspnea, he does continue to have mild weakness in his left leg with activity but does not stop him from doing his activities of daily living or his routine activities.  Accompanied by his wife.  Past Medical History:  Diagnosis Date   Arthritis    ASHD (arteriosclerotic heart disease)    Atherosclerosis of native arteries of the extremities with intermittent claudication    Cataract    Coronary atherosclerosis of native coronary artery    Diabetes mellitus    NIDDM   Dyslipidemia    ED (erectile dysfunction)    Full dentures    Glaucoma associated with ocular disorder, mild stage    bilateral   Hyperlipidemia    Hypertension    under control, has been on med. > 9 yr.   Impotence of organic origin    Meniscus tear 05/2012   right knee   Myocardial infarction [facility]) 10/2002   Stented coronary artery    Past Surgical History:  Procedure Laterality Date   ANTERIOR CERVICAL DECOMP/DISCECTOMY FUSION N/A 05/07/2018   Procedure: ANTERIOR CERVICAL DECOMPRESSION/DISCECTOMY FUSION CERVICAL FIVE - CERVICAL SIX;  Surgeon: Consuella Lose, MD;  Location: [place];  Service: Neurosurgery;  Laterality: N/A;  ANTERIOR CERVICAL DECOMPRESSION/DISCECTOMY FUSION CERVICAL FIVE - CERVICAL SIX   CARDIAC  CATHETERIZATION  10/23/2002   had 1 stent placed then-- by Dr. Melvern Banker   CARDIAC CATHETERIZATION N/A 12/21/2015   Procedure: Left Heart Cath and Coronary Angiography;  Surgeon: Adrian Prows, MD;  Location: [place] CV LAB;  Service: Cardiovascular;  Laterality: N/A;   CATARACT EXTRACTION Right 05/2017   CATARACT EXTRACTION Left 07/2017   COLONOSCOPY     CORONARY ARTERY BYPASS GRAFT N/A 01/02/2016   Procedure: CORONARY ARTERY BYPASS GRAFTING (CABG) TIMES 3 USING LEFT INTERNAL MAMMARY ARTERY AND RIGHT SAPHENOUS LEG VEIN HARVESTED ENDOSCOPICALLY;  Surgeon: Ivin Poot, MD;  Location: [place];  Service: Open Heart Surgery;  Laterality: N/A;   INSERTION OF MESH N/A 05/06/2016   Procedure: INSERTION OF MESH;  Surgeon: Georganna Skeans, MD;  Location: [place];  Service: General;  Laterality: N/A;   KNEE ARTHROSCOPY WITH MEDIAL MENISECTOMY  06/04/2012   Procedure: KNEE ARTHROSCOPY WITH MEDIAL MENISECTOMY;  Surgeon: Ninetta Lights, MD;  Location: [place];  Service: Orthopedics;  Laterality: Right;  RIGHT SCOPE MEDIAL MENISCECTOMY, CHONDROPLASTY, EXCISION LOOSE BODY   LOWER EXTREMITY ANGIOGRAM N/A 11/23/2013   Procedure: LOWER EXTREMITY ANGIOGRAM;  Surgeon: Laverda Page, MD;  Location: [facility] CATH LAB;  Service: Cardiovascular;  Laterality: N/A;   NECK SURGERY N/A    02/19/2018   PERIPHERAL VASCULAR CATHETERIZATION N/A 12/21/2015   Procedure: Abdominal Aortogram;  Surgeon: Adrian Prows, MD;  Location: [place] CV LAB;  Service: Cardiovascular;  Laterality: N/A;   PERIPHERAL VASCULAR CATHETERIZATION N/A 12/21/2015   Procedure:  Abdominal Aortogram w/Lower Extremity;  Surgeon: Adrian Prows, MD;  Location: [place] CV LAB;  Service: Cardiovascular;  Laterality: N/A;   TEE WITHOUT CARDIOVERSION N/A 01/02/2016   Procedure: TRANSESOPHAGEAL ECHOCARDIOGRAM (TEE);  Surgeon: Ivin Poot, MD;  Location: [place];  Service: Open Heart Surgery;  Laterality: N/A;   UMBILICAL HERNIA REPAIR N/A 05/06/2016    Procedure: HERNIA REPAIR UMBILICAL ADULT;  Surgeon: Georganna Skeans, MD;  Location: [place];  Service: General;  Laterality: N/A;   Social History   Tobacco Use   Smoking status: Former    Packs/day: 2.50    Years: 35.00    Pack years: 87.50    Types: Cigarettes    Quit date: 12/02/2002    Years since quitting: 18.3   Smokeless tobacco: Never  Substance Use Topics   Alcohol use: Yes    Comment: rare  Marital Status: Married   ROS  Review of Systems  Cardiovascular:  Positive for claudication and dyspnea on exertion. Negative for chest pain and leg swelling.  Musculoskeletal:  Positive for back pain and joint pain.  Gastrointestinal:  Negative for melena.  Objective  Blood pressure (!) 152/83, pulse 66, temperature 97.7 F (36.5 C), temperature source Temporal, resp. rate 17, height 5' 10"  (1.778 m), weight 200 lb 12.8 oz (91.1 kg), SpO2 98 %. Body mass index is 28.81 kg/m.  Vitals with BMI 04/19/2021 04/19/2021 01/30/2021  Height - 5' 10"  (No Data)  Weight - 200 lbs 13 oz (No Data)  BMI - 21.11 -  Systolic 552 080 (No Data)  Diastolic 83 81 (No Data)  Pulse 66 76 (No Data)    Physical Exam Constitutional:      Comments: Moderately built and mildly obese with mostly truncal obesity. He is in no acute distress.   Cardiovascular:     Rate and Rhythm: Normal rate and regular rhythm.     Pulses: Intact distal pulses.          Femoral pulses are 1+ on the right side and 1+ on the left side.      Popliteal pulses are 1+ on the right side and 0 on the left side.       Dorsalis pedis pulses are 0 on the right side and 0 on the left side.       Posterior tibial pulses are 0 on the right side and 0 on the left side.     Heart sounds: Normal heart sounds. Midsystolic click: .Marland Kitchen     Comments: Loss of hair bilateral shin noted. Capillary refill normal. No edema. No JVD Pulmonary:     Effort: Pulmonary effort is normal. No accessory muscle usage or respiratory distress.     Breath sounds:  Normal breath sounds.  Abdominal:     General: Bowel sounds are normal.     Palpations: Abdomen is soft.     Comments: Obese  Musculoskeletal:        General: Normal range of motion.  Neurological:     Mental Status: He is oriented to person, place, and time.   Radiology: No results found.  Laboratory examination:   Recent Labs    09/11/20 0744  NA 137  K 4.2  CL 103  CO2 26  GLUCOSE 132*  BUN 9  CREATININE 0.74  CALCIUM 9.7   CMP Latest Ref Rng & Units 09/11/2020 09/14/2019 09/03/2018  Glucose 70 - 99 mg/dL 132(H) 154(H) 134(H)  BUN 6 - 23 mg/dL 9 10 10   Creatinine 0.40 -  1.50 mg/dL 0.74 0.82 0.85  Sodium 135 - 145 mEq/L 137 136 141  Potassium 3.5 - 5.1 mEq/L 4.2 4.7 5.0  Chloride 96 - 112 mEq/L 103 101 104  CO2 19 - 32 mEq/L 26 30 29   Calcium 8.4 - 10.5 mg/dL 9.7 9.7 9.6  Total Protein 6.0 - 8.3 g/dL 7.1 7.1 7.0  Total Bilirubin 0.2 - 1.2 mg/dL 0.6 0.6 0.6  Alkaline Phos 39 - 117 U/L 49 52 52  AST 0 - 37 U/L 15 20 16   ALT 0 - 53 U/L 15 26 18    CBC Latest Ref Rng & Units 05/08/2018 04/28/2018 03/13/2018  WBC 4.0 - 10.5 K/uL 10.8(H) 6.2 5.5  Hemoglobin 13.0 - 17.0 g/dL 14.2 14.1 14.5  Hematocrit 39.0 - 52.0 % 42.5 42.4 42.3  Platelets 150 - 400 K/uL 166 152 153.0   Lipid Panel Recent Labs    09/11/20 0744  CHOL 132  TRIG 147.0  LDLCALC 58  VLDL 29.4  HDL 44.80  CHOLHDL 3    HEMOGLOBIN A1C Lab Results  Component Value Date   HGBA1C 6.4 09/11/2020   MPG 116.89 04/28/2018   TSH No results for input(s): TSH in the last 8760 hours. Medications   Current Outpatient Medications on File Prior to Visit  Medication Sig Dispense Refill   amLODipine-benazepril (LOTREL) 5-20 MG capsule TAKE 1 CAPSULE BY MOUTH EVERY DAY 90 capsule 3   aspirin EC 81 MG tablet Take 1 tablet (81 mg total) by mouth daily. 30 tablet    blood glucose meter kit and supplies Dispense based on pt and insurance preference. Use up to 3 times daily as directed. (Dx. E11.9). 1 each 12    Blood Pressure Monitoring (SPHYGMOMANOMETER) MISC Use daily to check BP.  Dx I10 1 each 0   brimonidine (ALPHAGAN P) 0.1 % SOLN Place 1 drop into both eyes 2 (two) times daily.     cilostazol (PLETAL) 100 MG tablet TAKE 1 TABLET BY MOUTH TWICE A DAY 180 tablet 1   dorzolamide-timolol (COSOPT) 22.3-6.8 MG/ML ophthalmic solution Place 1 drop into both eyes 2 (two) times daily.     fenofibrate (TRICOR) 145 MG tablet TAKE 1 TABLET BY MOUTH EVERY DAY IN THE EVENING 90 tablet 1   metFORMIN (GLUCOPHAGE) 500 MG tablet TAKE 1 TO 2 TABLETS BY MOUTH TWICE A DAY WITH A MEAL 360 tablet 3   metoprolol tartrate (LOPRESSOR) 25 MG tablet TAKE 1 TABLET BY MOUTH TWICE A DAY 180 tablet 4   nitroGLYCERIN (NITROSTAT) 0.4 MG SL tablet Place 1 tablet (0.4 mg total) under the tongue every 5 (five) minutes as needed for chest pain. 90 tablet 2   omega-3 acid ethyl esters (LOVAZA) 1 g capsule TAKE 1 CAPSULE BY MOUTH 2 TIMES DAILY. 180 capsule 1   RHOPRESSA 0.02 % SOLN Apply 1 drop to eye at bedtime.     rosuvastatin (CRESTOR) 20 MG tablet TAKE 1 TABLET BY MOUTH EVERY DAY 90 tablet 0   No current facility-administered medications on file prior to visit.   Cardiac Studies:   Peripheral arteriogram 11/23/2013: Bilateral aortoiliac bifurcation and iliac artery showed mild disease. Bilateral SFA shows cauliflower-like severe diffuse mixed calcific stenosis, bilateral severe diffuse disease of the anterior and peroneal arteries. Findings not consistent with bilateral hip claudication.  Coronary angiogram 12/21/2015:  1.  Normal LV systolic function, EF 97-94%. 2. Severe triple-vessel coronary artery disease.  Long segment occlusion, CTO of proximal mid distal RCA,large branches collateralized by Type III collaterals from  the distal LAD Continuing through PDA branch, several septal perforators giving collaterals to the distal RCA. 3.  Proximal LAD with severe diffuse calcification and tandem 80-85% stenosis.  Mid to distal LAD has  mild disease, tortuous. Moderate-sized diagonals with mild disease. 4. Circumflex coronary artery is a moderate-sized vessel. Mid-segment with a 40% stenosis at the origin of a large OM1 which has 3 secondary branches.  This OM1 has a old stent which is widely patent in the proximal segment however the ostium of the OM 1 has a high-grade 90% stenosis.  Peripheral arteriogram 12/21/2015: Abdominal aortogram with limited bifemoral arteriogram  Revealing mild disease in the abdominal aorta and in the aortoiliac bifurcation and proximal common femoral artery.  Proximal SFA is widely patent bilaterally.  Mild calcification is evident.  Carotid Doppler  [12/26/2015]: No hemodynamically significant arterial disease in the internal carotid artery bilaterally. Minimal heteregenous plaquie bilateral carotid bulb. Antegrade right vertebral artery flow. Antegrade left vertebral artery flow.  Lower extremity arterial duplex 12/13/2015: No hemodynamically significant stenoses are identified in the right lower extremity arterial system. Moderate velocity increase at the left distal superficial femoral artery suggests > 50% stenosis. Diffuse mixed plaque noted both lower extremities. This exam reveals mildly decreased perfusion of the right lower extremity with ABI 0.96 and moderately decreased perfusion of the left lower extremity with ABI 0.69 noted at the post tibial artery level. When compared to ABI done on 07/04/2011, ABI was normal bilaterally.  Echocardiogram 03/16/2019: Left ventricle cavity is normal in size. Moderate concentric hypertrophy of the left ventricle. Normal LV systolic function with EF 55%. Abnormal septal wall motion due to post-operative coronary artery bypass graft. Doppler evidence of grade I (impaired) diastolic dysfunction, normal LAP. Calculated EF 55%. Right atrial cavity is mildly dilated. Right ventricle cavity is mildly dilated. Normal right ventricular function. Mild (Grade I)  mitral regurgitation. Trace tricuspid regurgitation. IVC is dilated with blunted respiratory response. Estimated RA pressure 10-15 mmHg.  Compared to previous study in 2017, elevated right atrial pressure is new.   [place] Stress Test 03/22/2019: 1. Stress EKG is non-diagnostic, as this is pharmacological stress test. Myocardial perfusion imaging is normal. Left ventricular ejection fraction is  66% with normal wall motion. Low risk study. 2. Compared to the study done on 12/08/2015, inferior ischemia not present.  EKG:     EKG 04/19/2021: Normal sinus rhythm at rate of 66 bpm, normal axis.  Poor R wave progression, probably normal variant.  Nonspecific T abnormality.  No significant change from 04/05/2019.     Assessment     ICD-10-CM   1. Essential hypertension  I10 EKG 12-Lead    2. Atherosclerosis of native coronary artery of native heart without angina pectoris  I25.10     3. Claudication in peripheral vascular disease (HCC)  I73.9     4. Pure hypercholesterolemia  E78.00       Recommendations:   Jorge Adams is a 70 y.o. Caucasian male with coronary artery disease status post CABG 3 in 12/2015, known peripheral artery disease (angiography in 2015 revealing bilateral SFA cauliflower-like severe diffuse mixed atheresclerotic lesions with bilateral severe disease in the anterior and peroneal arteries), no significant disease was noted in the iliac vessels, also angiogram in 2017 revealing no iliac disease). He has type II diagnosed mellitus controlled, mixed hyperlipidemia and hypertension here for annual visit.  He is presently doing well except for chronic dyspnea and mild symptoms of claudication bilateral calves.  He has lost about 15  to 20 pounds in weight and his diabetes is now well controlled.  He is scheduled for complete physical and annual labs next month.  With regard to hypertension, his blood pressure is usually very well controlled, today it was elevated.   Advised him to monitor his blood pressure at home and if blood pressure remains elevated, we could consider switching his amlodipine/benazepril to a higher dose at 10/40 mg daily as his renal function is stable.  We could also consider addition of HCTZ or chlorthalidone or Maxide.  His labs otherwise are normal with excellent control of his lipids as well.  With regard to claudication, advised him to walk in the driveway at least for 20 minutes on a daily basis.  I have congratulated him on weight loss.  He is up-to-date with regard to cardiac issues except for hypertension control which will be addressed next month by Dr. Damita Dunnings.  I will see him back on an annual basis.      Adrian Prows, MD, [facility] 04/19/2021, 9:17 AM Office: [phone]

## 2021-04-26 ENCOUNTER — Encounter: Payer: Medicare HMO | Admitting: Family Medicine

## 2021-04-29 ENCOUNTER — Other Ambulatory Visit: Payer: Self-pay | Admitting: Cardiology

## 2021-04-29 DIAGNOSIS — E7800 Pure hypercholesterolemia, unspecified: Secondary | ICD-10-CM

## 2021-04-30 ENCOUNTER — Encounter: Payer: Self-pay | Admitting: Family Medicine

## 2021-04-30 ENCOUNTER — Other Ambulatory Visit: Payer: Self-pay

## 2021-04-30 ENCOUNTER — Ambulatory Visit (INDEPENDENT_AMBULATORY_CARE_PROVIDER_SITE_OTHER): Payer: Medicare HMO | Admitting: Family Medicine

## 2021-04-30 VITALS — BP 138/80 | HR 71 | Temp 97.6°F | Ht 70.0 in | Wt 199.0 lb

## 2021-04-30 DIAGNOSIS — Z1211 Encounter for screening for malignant neoplasm of colon: Secondary | ICD-10-CM | POA: Diagnosis not present

## 2021-04-30 DIAGNOSIS — E1159 Type 2 diabetes mellitus with other circulatory complications: Secondary | ICD-10-CM

## 2021-04-30 DIAGNOSIS — E785 Hyperlipidemia, unspecified: Secondary | ICD-10-CM | POA: Diagnosis not present

## 2021-04-30 DIAGNOSIS — I152 Hypertension secondary to endocrine disorders: Secondary | ICD-10-CM

## 2021-04-30 DIAGNOSIS — Z7189 Other specified counseling: Secondary | ICD-10-CM | POA: Diagnosis not present

## 2021-04-30 DIAGNOSIS — Z23 Encounter for immunization: Secondary | ICD-10-CM | POA: Diagnosis not present

## 2021-04-30 DIAGNOSIS — I739 Peripheral vascular disease, unspecified: Secondary | ICD-10-CM

## 2021-04-30 DIAGNOSIS — Z0000 Encounter for general adult medical examination without abnormal findings: Secondary | ICD-10-CM

## 2021-04-30 LAB — POCT GLYCOSYLATED HEMOGLOBIN (HGB A1C): Hemoglobin A1C: 5.7 % — AB (ref 4.0–5.6)

## 2021-04-30 MED ORDER — METFORMIN HCL 500 MG PO TABS: ORAL_TABLET | ORAL | Status: DC

## 2021-04-30 NOTE — Progress Notes (Signed)
This visit occurred during the SARS-CoV-2 public health emergency.  Safety protocols were in place, including screening questions prior to the visit, additional usage of staff PPE, and extensive cleaning of exam room while observing appropriate contact time as indicated for disinfecting solutions.  Diabetes:  Using medications without difficulties: yes, 2 metformin BID Hypoglycemic episodes: no Hyperglycemic episodes:no Feet problems: some tingling but not worse then prev.   Blood Sugars averaging: ~100.   eye exam within last year: yes  Hypertension:    Using medication without problems or lightheadedness: rarely lightheaded, cautions d/w pt.  If progressive, then he'll update me.   Chest pain with exertion: no Edema: no Short of breath: minimal and not worse, noted with sig exertion.    Elevated Cholesterol: Using medications without problems: yes Muscle aches: no Diet compliance: yes Exercise: yes  Tetanus 2012  Shingles 2020 PNA prev done.   Flu shot 2022.  Covid vaccine prev done.   Colonoscopy 2012, due for f/u, d/w pt. Referral placed 2022 Prostate cancer screening and PSA options (with potential risks and benefits of testing vs not testing) were discussed along with recent recs/guidelines. He declined testing PSA at this point.  Living will d/w pt. Wife designated if patient were incapacitated.  Diet and exercise d/w pt. Intentional weight loss.  He cut back on portions.  AAA screening prev done.   PMH and SH reviewed.   Vital signs, Meds and allergies reviewed.  ROS: Per HPI unless specifically indicated in ROS section   GEN: nad, alert and oriented HEENT: ncat NECK: supple w/o LA CV: rrr PULM: ctab, no inc wob ABD: soft, +bs EXT: no edema SKIN: no acute rash  Diabetic foot exam: Normal inspection No skin breakdown No calluses  Dec DP pulses B Normal sensation to light tough and monofilament Nails normal

## 2021-04-30 NOTE — Patient Instructions (Addendum)
I would cut the metformin back to 1 pill twice a day.   Update me as needed.   We can recheck your labs prior to a visit in about 6 months.  Take care.  Glad to see you.  You should get a call from the GI clinic.  Thanks for your effort.

## 2021-05-02 NOTE — Assessment & Plan Note (Signed)
Tetanus 2012  Shingles 2020 PNA prev done.   Flu shot 2022.  Covid vaccine prev done.   Colonoscopy 2012, due for f/u, d/w pt. Referral placed 2022 Prostate cancer screening and PSA options (with potential risks and benefits of testing vs not testing) were discussed along with recent recs/guidelines. He declined testing PSA at this point.  Living will d/w pt. Wife designated if patient were incapacitated.  Diet and exercise d/w pt. Intentional weight loss.  He cut back on portions.  AAA screening prev done.

## 2021-05-02 NOTE — Assessment & Plan Note (Signed)
Continue metoprolol amlodipine and benazepril.  Previous labs discussed with patient.

## 2021-05-02 NOTE — Assessment & Plan Note (Signed)
Living will d/w pt.  Wife designated if patient were incapacitated.   ?

## 2021-05-02 NOTE — Assessment & Plan Note (Signed)
Continue work on diet and exercise.  Continue fenofibrate and Crestor.

## 2021-05-02 NOTE — Assessment & Plan Note (Signed)
Decreased but not absent dorsalis pedis pulses.  Continue Pletal.  Update me as needed.  Routine foot care discussed with patient.  Continue risk factor modification with diet and exercise and medications.  He agrees to plan.

## 2021-05-02 NOTE — Assessment & Plan Note (Signed)
A1c clearly improved.  Discussed with patient.  Continue work on diet and exercise. I would cut the metformin back to 1 pill twice a day.   We can recheck labs prior to a visit in about 6 months.  He agrees with plan.

## 2021-05-08 ENCOUNTER — Encounter: Payer: Self-pay | Admitting: Gastroenterology

## 2021-05-29 ENCOUNTER — Other Ambulatory Visit: Payer: Self-pay | Admitting: Family Medicine

## 2021-06-11 DIAGNOSIS — M25562 Pain in left knee: Secondary | ICD-10-CM | POA: Diagnosis not present

## 2021-06-20 DIAGNOSIS — H401131 Primary open-angle glaucoma, bilateral, mild stage: Secondary | ICD-10-CM | POA: Diagnosis not present

## 2021-06-25 DIAGNOSIS — M25551 Pain in right hip: Secondary | ICD-10-CM | POA: Diagnosis not present

## 2021-06-25 DIAGNOSIS — M545 Low back pain, unspecified: Secondary | ICD-10-CM | POA: Diagnosis not present

## 2021-06-25 DIAGNOSIS — M25562 Pain in left knee: Secondary | ICD-10-CM | POA: Diagnosis not present

## 2021-06-26 ENCOUNTER — Other Ambulatory Visit: Payer: Self-pay

## 2021-06-26 ENCOUNTER — Ambulatory Visit (AMBULATORY_SURGERY_CENTER): Payer: Medicare HMO | Admitting: *Deleted

## 2021-06-26 ENCOUNTER — Encounter: Payer: Self-pay | Admitting: Gastroenterology

## 2021-06-26 VITALS — Ht 70.0 in | Wt 199.0 lb

## 2021-06-26 DIAGNOSIS — Z86010 Personal history of colonic polyps: Secondary | ICD-10-CM

## 2021-06-26 MED ORDER — NA SULFATE-K SULFATE-MG SULF 17.5-3.13-1.6 GM/177ML PO SOLN: 1.0000 | ORAL | 0 refills | Status: DC

## 2021-06-26 NOTE — Progress Notes (Signed)
Patient's pre-visit was done today over the phone with the patient. Name,DOB and address verified. Patient denies any allergies to Eggs and Soy. Patient denies any problems with anesthesia/sedation. Patient is not taking any diet pills or blood thinners. No home Oxygen. Packet of Prep instructions mailed to patient including a copy of a consent form-pt is aware. Patient understands to call us back with any questions or concerns. Patient is aware of our care-partner policy and [id number] safety protocol.   EMMI education assigned to the patient for the procedure, sent to [facility].   The patient is COVID-19 vaccinated.

## 2021-07-10 ENCOUNTER — Encounter: Payer: Self-pay | Admitting: Gastroenterology

## 2021-07-10 ENCOUNTER — Other Ambulatory Visit: Payer: Self-pay

## 2021-07-10 ENCOUNTER — Ambulatory Visit (AMBULATORY_SURGERY_CENTER): Payer: Medicare HMO | Admitting: Gastroenterology

## 2021-07-10 VITALS — BP 136/82 | HR 71 | Temp 96.6°F | Resp 14 | Ht 70.0 in | Wt 199.0 lb

## 2021-07-10 DIAGNOSIS — D122 Benign neoplasm of ascending colon: Secondary | ICD-10-CM

## 2021-07-10 DIAGNOSIS — D120 Benign neoplasm of cecum: Secondary | ICD-10-CM | POA: Diagnosis not present

## 2021-07-10 DIAGNOSIS — Z86010 Personal history of colonic polyps: Secondary | ICD-10-CM

## 2021-07-10 DIAGNOSIS — D123 Benign neoplasm of transverse colon: Secondary | ICD-10-CM | POA: Diagnosis not present

## 2021-07-10 DIAGNOSIS — K635 Polyp of colon: Secondary | ICD-10-CM | POA: Diagnosis not present

## 2021-07-10 MED ORDER — SODIUM CHLORIDE 0.9 % IV SOLN: 500.0000 mL | Freq: Once | INTRAVENOUS | Status: DC

## 2021-07-10 NOTE — Progress Notes (Signed)
C.W. vital signs. 

## 2021-07-10 NOTE — Progress Notes (Signed)
Called to room to assist during endoscopic procedure.  Patient ID and intended procedure confirmed with present staff. Received instructions for my participation in the procedure from the performing physician.  

## 2021-07-10 NOTE — Op Note (Signed)
[place] Patient Name: Jorge Adams Procedure Date: 07/10/2021 11:15 AM MRN: [id number] Endoscopist: Nicki Reaper E. Candis Schatz , MD Age: 70 Referring MD:  Date of Birth: 05-03-1951 Gender: Male Account #: [id number] Procedure:                Colonoscopy Indications:              High risk colon cancer surveillance: Personal                            history of adenoma less than 10 mm in size Medicines:                Monitored Anesthesia Care Procedure:                Pre-Anesthesia Assessment:                           - Prior to the procedure, a History and Physical                            was performed, and patient medications and                            allergies were reviewed. The patient's tolerance of                            previous anesthesia was also reviewed. The risks                            and benefits of the procedure and the sedation                            options and risks were discussed with the patient.                            All questions were answered, and informed consent                            was obtained. Prior Anticoagulants: The patient has                            taken no previous anticoagulant or antiplatelet                            agents except for aspirin. ASA Grade Assessment:                            III - A patient with severe systemic disease. After                            reviewing the risks and benefits, the patient was                            deemed in satisfactory condition to undergo the  procedure.                           After obtaining informed consent, the colonoscope                            was passed under direct vision. Throughout the                            procedure, the patient's blood pressure, pulse, and                            oxygen saturations were monitored continuously. The                            Colonoscope was introduced through the  anus and                            advanced to the the cecum, identified by                            appendiceal orifice and ileocecal valve. The                            colonoscopy was somewhat difficult due to                            significant looping. Successful completion of the                            procedure was aided by using manual pressure. The                            patient tolerated the procedure well. The quality                            of the bowel preparation was adequate. The                            ileocecal valve, appendiceal orifice, and rectum                            were photographed. Scope In: 11:27:53 AM Scope Out: 12:01:28 PM Scope Withdrawal Time: 0 hours 26 minutes 4 seconds  Total Procedure Duration: 0 hours 33 minutes 35 seconds  Findings:                 The perianal and digital rectal examinations were                            normal. Pertinent negatives include normal                            sphincter tone and no palpable rectal lesions.  Two sessile polyps were found in the cecum. The                            polyps were 2 to 3 mm in size. These polyps were                            removed with a cold snare. Resection and retrieval                            were complete. Estimated blood loss was minimal.                           A 5 mm polyp was found in the ascending colon. The                            polyp was sessile. The polyp was removed with a                            cold snare. Resection and retrieval were complete.                            Estimated blood loss was minimal.                           Four sessile polyps were found in the transverse                            colon. The polyps were 4 to 7 mm in size. These                            polyps were removed with a cold snare. Resection                            and retrieval were complete. Estimated blood loss                             was minimal.                           A 5 mm polyp was found in the splenic flexure. The                            polyp was sessile. The polyp was removed with a                            cold snare. Resection and retrieval were complete.                            Estimated blood loss was minimal.                           The exam was otherwise normal throughout the  examined colon.                           The retroflexed view of the distal rectum and anal                            verge was normal and showed no anal or rectal                            abnormalities. Complications:            No immediate complications. Estimated Blood Loss:     Estimated blood loss was minimal. Impression:               - Two 2 to 3 mm polyps in the cecum, removed with a                            cold snare. Resected and retrieved.                           - One 5 mm polyp in the ascending colon, removed                            with a cold snare. Resected and retrieved.                           - Four 4 to 7 mm polyps in the transverse colon,                            removed with a cold snare. Resected and retrieved.                           - One 5 mm polyp at the splenic flexure, removed                            with a cold snare. Resected and retrieved.                           - The distal rectum and anal verge are normal on                            retroflexion view. Recommendation:           - Patient has a contact number available for                            emergencies. The signs and symptoms of potential                            delayed complications were discussed with the                            patient. Return to normal activities tomorrow.  Written discharge instructions were provided to the                            patient.                           - Resume previous diet.                            - Continue present medications.                           - Await pathology results.                           - Repeat colonoscopy in 3 years for surveillance. Amylia Collazos E. Candis Schatz, MD 07/10/2021 12:07:38 PM This report has been signed electronically.

## 2021-07-10 NOTE — Progress Notes (Signed)
PT taken to PACU. Monitors in place. VSS. Report given to RN. 

## 2021-07-10 NOTE — Progress Notes (Signed)
[facility] History and Physical   Primary Care Physician:  [name], MD   Reason for Procedure:   Colon cancer screening  Plan:    Surveillance colonoscopy     HPI: Jorge Adams is a 70 y.o. male undergoing surveillance colonoscopy.  He was found to have 2 small tubular adenomas in 2012.  He has no family history of colon cancer and no chronic GI symptoms.    Past Medical History:  Diagnosis Date   Arthritis    ASHD (arteriosclerotic heart disease)    Atherosclerosis of native arteries of the extremities with intermittent claudication    Cataract    Coronary atherosclerosis of native coronary artery    Diabetes mellitus    NIDDM   Dyslipidemia    ED (erectile dysfunction)    Full dentures    Glaucoma associated with ocular disorder, mild stage    bilateral   Hyperlipidemia    Hypertension    under control, has been on med. > 9 yr.   Impotence of organic origin    Meniscus tear 05/2012   right knee   Myocardial infarction [facility]) 10/2002   Stented coronary artery     Past Surgical History:  Procedure Laterality Date   ANTERIOR CERVICAL DECOMP/DISCECTOMY FUSION N/A 05/07/2018   Procedure: ANTERIOR CERVICAL DECOMPRESSION/DISCECTOMY FUSION CERVICAL FIVE - CERVICAL SIX;  Surgeon: Consuella Lose, MD;  Location: [facility];  Service: Neurosurgery;  Laterality: N/A;  ANTERIOR CERVICAL DECOMPRESSION/DISCECTOMY FUSION CERVICAL FIVE - CERVICAL SIX   CARDIAC CATHETERIZATION  10/23/2002   had 1 stent placed then-- by Dr. Melvern Banker   CARDIAC CATHETERIZATION N/A 12/21/2015   Procedure: Left Heart Cath and Coronary Angiography;  Surgeon: Adrian Prows, MD;  Location: [facility];  Service: Cardiovascular;  Laterality: N/A;   CATARACT EXTRACTION Right 05/2017   CATARACT EXTRACTION Left 07/2017   COLONOSCOPY  10/08/2010   Dr.Patterson   CORONARY ARTERY BYPASS GRAFT N/A 01/02/2016   Procedure: CORONARY ARTERY BYPASS GRAFTING (CABG) TIMES 3 USING LEFT INTERNAL  MAMMARY ARTERY AND RIGHT SAPHENOUS LEG VEIN HARVESTED ENDOSCOPICALLY;  Surgeon: Ivin Poot, MD;  Location: [place];  Service: Open Heart Surgery;  Laterality: N/A;   INSERTION OF MESH N/A 05/06/2016   Procedure: INSERTION OF MESH;  Surgeon: Georganna Skeans, MD;  Location: [place];  Service: General;  Laterality: N/A;   KNEE ARTHROSCOPY WITH MEDIAL MENISECTOMY  06/04/2012   Procedure: KNEE ARTHROSCOPY WITH MEDIAL MENISECTOMY;  Surgeon: Ninetta Lights, MD;  Location: [place];  Service: Orthopedics;  Laterality: Right;  RIGHT SCOPE MEDIAL MENISCECTOMY, CHONDROPLASTY, EXCISION LOOSE BODY   LOWER EXTREMITY ANGIOGRAM N/A 11/23/2013   Procedure: LOWER EXTREMITY ANGIOGRAM;  Surgeon: Laverda Page, MD;  Location: [facility] CATH [facility];  Service: Cardiovascular;  Laterality: N/A;   NECK SURGERY N/A    02/19/2018   PERIPHERAL VASCULAR CATHETERIZATION N/A 12/21/2015   Procedure: Abdominal Aortogram;  Surgeon: Adrian Prows, MD;  Location: [facility];  Service: Cardiovascular;  Laterality: N/A;   PERIPHERAL VASCULAR CATHETERIZATION N/A 12/21/2015   Procedure: Abdominal Aortogram w/Lower Extremity;  Surgeon: Adrian Prows, MD;  Location: [place] [facility];  Service: Cardiovascular;  Laterality: N/A;   TEE WITHOUT CARDIOVERSION N/A 01/02/2016   Procedure: TRANSESOPHAGEAL ECHOCARDIOGRAM (TEE);  Surgeon: Ivin Poot, MD;  Location: [place];  Service: Open Heart Surgery;  Laterality: N/A;   UMBILICAL HERNIA REPAIR N/A 05/06/2016   Procedure: HERNIA REPAIR UMBILICAL ADULT;  Surgeon: Georganna Skeans, MD;  Location: [place];  Service: General;  Laterality: N/A;    Prior to Admission medications   Medication Sig Start Date End Date Taking? Authorizing Provider  amLODipine-benazepril (LOTREL) 5-20 MG capsule TAKE 1 CAPSULE BY MOUTH EVERY DAY 07/19/20  Yes Adrian Prows, MD  aspirin EC 81 MG tablet Take 1 tablet (81 mg total) by mouth daily. 05/13/18  Yes Costella, Vista Mink, PA-C  blood glucose meter kit  and supplies Dispense based on pt and insurance preference. Use up to 3 times daily as directed. (Dx. E11.9). 09/08/20  Yes [name], MD  Blood Pressure Monitoring [facility]) MISC Use daily to check BP.  Dx I10 08/15/20  Yes [name], MD  brimonidine (ALPHAGAN P) 0.1 % SOLN Place 1 drop into both eyes 2 (two) times daily.   Yes [provider]  CELEBREX 200 MG capsule Take 200 mg by mouth daily as needed. 06/27/21  Yes [provider]  cilostazol (PLETAL) 100 MG tablet TAKE 1 TABLET BY MOUTH TWICE A DAY 05/29/21  Yes [name], MD  dorzolamide-timolol (COSOPT) 22.3-6.8 MG/ML ophthalmic solution Place 1 drop into both eyes 2 (two) times daily.   Yes [provider]  fenofibrate (TRICOR) 145 MG tablet TAKE 1 TABLET BY MOUTH EVERY DAY IN THE EVENING 12/11/20  Yes Adrian Prows, MD  metFORMIN (GLUCOPHAGE) 500 MG tablet 1 TABLET BY MOUTH TWICE A DAY WITH A MEAL 04/30/21  Yes [name], MD  metoprolol tartrate (LOPRESSOR) 25 MG tablet TAKE 1 TABLET BY MOUTH TWICE A DAY 12/25/20  Yes Adrian Prows, MD  Netarsudil-Latanoprost (ROCKLATAN) 0.02-0.005 % SOLN Apply 1 drop to eye at bedtime. In both eyes   Yes [provider]  omega-3 acid ethyl esters (LOVAZA) 1 g capsule TAKE 1 CAPSULE BY MOUTH 2 TIMES DAILY. 01/08/21  Yes Adrian Prows, MD  rosuvastatin (CRESTOR) 20 MG tablet TAKE 1 TABLET BY MOUTH EVERY DAY 04/30/21  Yes Adrian Prows, MD  nitroGLYCERIN (NITROSTAT) 0.4 MG SL tablet Place 1 tablet (0.4 mg total) under the tongue every 5 (five) minutes as needed for chest pain. 03/30/19   Adrian Prows, MD    Current Outpatient Medications  Medication Sig Dispense Refill   amLODipine-benazepril (LOTREL) 5-20 MG capsule TAKE 1 CAPSULE BY MOUTH EVERY DAY 90 capsule 3   aspirin EC 81 MG tablet Take 1 tablet (81 mg total) by mouth daily. 30 tablet    blood glucose meter kit and supplies Dispense based on pt and insurance preference. Use up to 3 times daily as  directed. (Dx. E11.9). 1 each 12   Blood Pressure Monitoring (SPHYGMOMANOMETER) MISC Use daily to check BP.  Dx I10 1 each 0   brimonidine (ALPHAGAN P) 0.1 % SOLN Place 1 drop into both eyes 2 (two) times daily.     CELEBREX 200 MG capsule Take 200 mg by mouth daily as needed.     cilostazol (PLETAL) 100 MG tablet TAKE 1 TABLET BY MOUTH TWICE A DAY 180 tablet 1   dorzolamide-timolol (COSOPT) 22.3-6.8 MG/ML ophthalmic solution Place 1 drop into both eyes 2 (two) times daily.     fenofibrate (TRICOR) 145 MG tablet TAKE 1 TABLET BY MOUTH EVERY DAY IN THE EVENING 90 tablet 1   metFORMIN (GLUCOPHAGE) 500 MG tablet 1 TABLET BY MOUTH TWICE A DAY WITH A MEAL     metoprolol tartrate (LOPRESSOR) 25 MG tablet TAKE 1 TABLET BY MOUTH TWICE A DAY 180 tablet 4   Netarsudil-Latanoprost (ROCKLATAN) 0.02-0.005 % SOLN Apply 1 drop  to eye at bedtime. In both eyes     omega-3 acid ethyl esters (LOVAZA) 1 g capsule TAKE 1 CAPSULE BY MOUTH 2 TIMES DAILY. 180 capsule 1   rosuvastatin (CRESTOR) 20 MG tablet TAKE 1 TABLET BY MOUTH EVERY DAY 90 tablet 0   nitroGLYCERIN (NITROSTAT) 0.4 MG SL tablet Place 1 tablet (0.4 mg total) under the tongue every 5 (five) minutes as needed for chest pain. 90 tablet 2   Current Facility-Administered Medications  Medication Dose Route Frequency Provider Last Rate Last Admin   0.9 %  sodium chloride infusion  500 mL Intravenous Once Daryel November, MD        Allergies as of 07/10/2021   (No Known Allergies)    Family History  Problem Relation Age of Onset   Bone cancer Mother    Pneumonia Father    Diabetes Brother    Heart disease Brother        Pacemaker   COPD Brother    Colon cancer Neg Hx    Prostate cancer Neg Hx    Esophageal cancer Neg Hx    Stomach cancer Neg Hx     Social History   Socioeconomic History   Marital status: Married    Spouse name: Not on file   Number of children: 2   Years of education: Not on file   Highest education level: Not on file   Occupational History   Occupation: retired  Tobacco Use   Smoking status: Former    Packs/day: 2.50    Years: 35.00    Pack years: 87.50    Types: Cigarettes    Quit date: 12/02/2002    Years since quitting: 18.6   Smokeless tobacco: Never  Vaping Use   Vaping Use: Never used  Substance and Sexual Activity   Alcohol use: Yes    Comment: rare   Drug use: No   Sexual activity: Not on file  Other Topics Concern   Not on file  Social History Narrative   Retired Librarian, 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    [place], [employer]   Social Determinants of Health   Financial Resource Strain: Low Risk    Difficulty of Paying Living Expenses: Not hard at all  Food Insecurity: No Food Insecurity   Worried About Charity fundraiser in the Last Year: Never true   Arboriculturist in the Last Year: Never true  Transportation Needs: No Transportation Needs   Lack of Transportation (Medical): No   Lack of Transportation (Non-Medical): No  Physical Activity: Inactive   Days of Exercise per Week: 0 days   Minutes of Exercise per Session: 0 min  Stress: No Stress Concern Present   Feeling of Stress : Not at all  Social Connections: Not on file  Intimate Partner Violence: Not At Risk   Fear of Current or Ex-Partner: No   Emotionally Abused: No   Physically Abused: No   Sexually Abused: No    Review of Systems:  All other review of systems negative except as mentioned in the HPI.  Physical Exam: Vital signs BP (!) 161/94    Pulse 77    Temp (!) 96.6 F (35.9 C)    Ht $R'5\' 10"'QZ$  (1.778 m)    Wt 199 lb (90.3 kg)    SpO2 97%    BMI 28.55 kg/m   General:  Alert,  Well-developed, well-nourished, pleasant and cooperative in NAD Airway:  Mallampati 2 Lungs:  Clear throughout to auscultation.   Heart:  Regular rate and rhythm; no murmurs, clicks, rubs,  or gallops. Abdomen:  Soft, nontender and  nondistended. Normal bowel sounds.   Neuro/Psych:  Normal mood and affect. A and O x 3   Emalene Welte E. Candis Schatz, MD [facility]

## 2021-07-10 NOTE — Patient Instructions (Signed)
Handouts Provided:  Polyps  YOU HAD AN ENDOSCOPIC PROCEDURE TODAY AT THE [facility]:   Refer to the procedure report that was given to you for any specific questions about what was found during the examination.  If the procedure report does not answer your questions, please call your gastroenterologist to clarify.  If you requested that your care partner not be given the details of your procedure findings, then the procedure report has been included in a sealed envelope for you to review at your convenience later.  YOU SHOULD EXPECT: Some feelings of bloating in the abdomen. Passage of more gas than usual.  Walking can help get rid of the air that was put into your GI tract during the procedure and reduce the bloating. If you had a lower endoscopy (such as a colonoscopy or flexible sigmoidoscopy) you may notice spotting of blood in your stool or on the toilet paper. If you underwent a bowel prep for your procedure, you may not have a normal bowel movement for a few days.  Please Note:  You might notice some irritation and congestion in your nose or some drainage.  This is from the oxygen used during your procedure.  There is no need for concern and it should clear up in a day or so.  SYMPTOMS TO REPORT IMMEDIATELY:   Following lower endoscopy (colonoscopy or flexible sigmoidoscopy):  Excessive amounts of blood in the stool  Significant tenderness or worsening of abdominal pains  Swelling of the abdomen that is new, acute  Fever of 100F or higher  For urgent or emergent issues, a gastroenterologist can be reached at any hour by calling [phone]. Do not use MyChart messaging for urgent concerns.    DIET:  We do recommend a small meal at first, but then you may proceed to your regular diet.  Drink plenty of fluids but you should avoid alcoholic beverages for 24 hours.  ACTIVITY:  You should plan to take it easy for the rest of today and you should NOT DRIVE or use heavy  machinery until tomorrow (because of the sedation medicines used during the test).    FOLLOW UP: Our staff will call the number listed on your records 48-72 hours following your procedure to check on you and address any questions or concerns that you may have regarding the information given to you following your procedure. If we do not reach you, we will leave a message.  We will attempt to reach you two times.  During this call, we will ask if you have developed any symptoms of COVID 19. If you develop any symptoms (ie: fever, flu-like symptoms, shortness of breath, cough etc.) before then, please call [phone].  If you test positive for Covid 19 in the 2 weeks post procedure, please call and report this information to us.    If any biopsies were taken you will be contacted by phone or by letter within the next 1-3 weeks.  Please call us at [phone] if you have not heard about the biopsies in 3 weeks.    SIGNATURES/CONFIDENTIALITY: You and/or your care partner have signed paperwork which will be entered into your electronic medical record.  These signatures attest to the fact that that the information above on your After Visit Summary has been reviewed and is understood.  Full responsibility of the confidentiality of this discharge information lies with you and/or your care-partner.  

## 2021-07-10 NOTE — Progress Notes (Signed)
Pt's states no medical or surgical changes since previsit or office visit. 

## 2021-07-11 DIAGNOSIS — M25562 Pain in left knee: Secondary | ICD-10-CM | POA: Diagnosis not present

## 2021-07-12 ENCOUNTER — Telehealth: Payer: Self-pay

## 2021-07-12 NOTE — Telephone Encounter (Signed)
°  Follow up Call-  Call back number 07/10/2021  Post procedure Call Back phone  # [phone]  Permission to leave phone message Yes  Some recent data might be hidden     Patient questions:  Do you have a fever, pain , or abdominal swelling? No. Pain Score  0 *  Have you tolerated food without any problems? Yes.    Have you been able to return to your normal activities? Yes.    Do you have any questions about your discharge instructions: Diet   No. Medications  No. Follow up visit  No.  Do you have questions or concerns about your Care? No.  Actions: * If pain score is 4 or above: No action needed, pain <4.  Have you developed a fever since your procedure? no  2.   Have you had an respiratory symptoms (SOB or cough) since your procedure? no  3.   Have you tested positive for COVID 19 since your procedure no  4.   Have you had any family members/close contacts diagnosed with the COVID 19 since your procedure?  no   If yes to any of these questions please route to Joylene John, RN and Joella Prince, RN

## 2021-07-13 ENCOUNTER — Other Ambulatory Visit: Payer: Self-pay | Admitting: Cardiology

## 2021-07-13 DIAGNOSIS — M25551 Pain in right hip: Secondary | ICD-10-CM | POA: Diagnosis not present

## 2021-07-13 DIAGNOSIS — M25562 Pain in left knee: Secondary | ICD-10-CM | POA: Diagnosis not present

## 2021-07-18 NOTE — Progress Notes (Signed)
Mr. Jorge Adams,   The eight polyps that I removed during your recent procedure were completely benign but were proven to be "pre-cancerous" polyps that MAY have grown into cancers if they had not been removed.  Studies shows that at least 20% of women over age 72 and 30% of men over age 19 have pre-cancerous polyps.  Based on current nationally recognized surveillance guidelines, I recommend that you have a repeat colonoscopy in 3 years.   If you develop any new rectal bleeding, abdominal pain or significant bowel habit changes, please contact me before then.  Jorge Adams E. Jorge Schatz, MD [facility]

## 2021-07-21 ENCOUNTER — Other Ambulatory Visit: Payer: Self-pay | Admitting: Cardiology

## 2021-07-21 DIAGNOSIS — E7800 Pure hypercholesterolemia, unspecified: Secondary | ICD-10-CM

## 2021-07-28 DIAGNOSIS — M25551 Pain in right hip: Secondary | ICD-10-CM | POA: Diagnosis not present

## 2021-07-30 ENCOUNTER — Other Ambulatory Visit: Payer: Self-pay | Admitting: Cardiology

## 2021-07-30 ENCOUNTER — Other Ambulatory Visit: Payer: Self-pay | Admitting: Family Medicine

## 2021-07-30 DIAGNOSIS — E7800 Pure hypercholesterolemia, unspecified: Secondary | ICD-10-CM

## 2021-07-30 DIAGNOSIS — E782 Mixed hyperlipidemia: Secondary | ICD-10-CM

## 2021-08-02 DIAGNOSIS — M5416 Radiculopathy, lumbar region: Secondary | ICD-10-CM | POA: Diagnosis not present

## 2021-08-02 DIAGNOSIS — M79604 Pain in right leg: Secondary | ICD-10-CM | POA: Diagnosis not present

## 2021-08-07 DIAGNOSIS — M5416 Radiculopathy, lumbar region: Secondary | ICD-10-CM | POA: Diagnosis not present

## 2021-08-07 DIAGNOSIS — M79604 Pain in right leg: Secondary | ICD-10-CM | POA: Diagnosis not present

## 2021-08-09 DIAGNOSIS — M79604 Pain in right leg: Secondary | ICD-10-CM | POA: Diagnosis not present

## 2021-08-09 DIAGNOSIS — M5416 Radiculopathy, lumbar region: Secondary | ICD-10-CM | POA: Diagnosis not present

## 2021-08-13 DIAGNOSIS — M5416 Radiculopathy, lumbar region: Secondary | ICD-10-CM | POA: Diagnosis not present

## 2021-08-13 DIAGNOSIS — M79604 Pain in right leg: Secondary | ICD-10-CM | POA: Diagnosis not present

## 2021-08-17 DIAGNOSIS — M5416 Radiculopathy, lumbar region: Secondary | ICD-10-CM | POA: Diagnosis not present

## 2021-08-17 DIAGNOSIS — M79604 Pain in right leg: Secondary | ICD-10-CM | POA: Diagnosis not present

## 2021-08-20 DIAGNOSIS — M5416 Radiculopathy, lumbar region: Secondary | ICD-10-CM | POA: Diagnosis not present

## 2021-08-20 DIAGNOSIS — M79604 Pain in right leg: Secondary | ICD-10-CM | POA: Diagnosis not present

## 2021-08-22 DIAGNOSIS — M25562 Pain in left knee: Secondary | ICD-10-CM | POA: Diagnosis not present

## 2021-08-22 DIAGNOSIS — M25551 Pain in right hip: Secondary | ICD-10-CM | POA: Diagnosis not present

## 2021-09-12 DIAGNOSIS — H401131 Primary open-angle glaucoma, bilateral, mild stage: Secondary | ICD-10-CM | POA: Diagnosis not present

## 2021-09-16 ENCOUNTER — Encounter: Payer: Self-pay | Admitting: Cardiology

## 2021-09-25 ENCOUNTER — Telehealth: Payer: Self-pay

## 2021-09-25 NOTE — Chronic Care Management (AMB) (Unsigned)
° ° °  Chronic Care Management Pharmacy Assistant   Name: Jorge Adams  MRN: [id number] DOB: 1951-01-19   Reason for Encounter: Diabetes and Hypertension Disease State    Recent office visits:  04/30/21-PCP-[name],MD-Patient presented for AWV- New A1c 5.7,  Recent consult visits:  07/28/21-Orthopedic Surgery-[name],MD- no data found 07/11/21-Sports Medicine-[name],MD-no data found 07/10/21- Gastroenterology-[name],MD-Patient presented for surveillance colonoscopy procedure.No medication changes 06/20/21-Optometry-[name]- no data found  Hospital visits:  {Hospital DC Yes/No:[id number]}  Medications: Outpatient Encounter Medications as of 09/25/2021  Medication Sig   amLODipine-benazepril (LOTREL) 5-20 MG capsule TAKE 1 CAPSULE BY MOUTH EVERY DAY   aspirin EC 81 MG tablet Take 1 tablet (81 mg total) by mouth daily.   blood glucose meter kit and supplies Dispense based on pt and insurance preference. Use up to 3 times daily as directed. (Dx. E11.9).   Blood Pressure Monitoring (SPHYGMOMANOMETER) MISC Use daily to check BP.  Dx I10   brimonidine (ALPHAGAN P) 0.1 % SOLN Place 1 drop into both eyes 2 (two) times daily.   CELEBREX 200 MG capsule Take 200 mg by mouth daily as needed.   cilostazol (PLETAL) 100 MG tablet TAKE 1 TABLET BY MOUTH TWICE A DAY   dorzolamide-timolol (COSOPT) 22.3-6.8 MG/ML ophthalmic solution Place 1 drop into both eyes 2 (two) times daily.   fenofibrate (TRICOR) 145 MG tablet TAKE 1 TABLET BY MOUTH EVERY DAY IN THE EVENING   metFORMIN (GLUCOPHAGE) 500 MG tablet TAKE 1 TO 2 TABLETS BY MOUTH TWICE A DAY WITH A MEAL   metoprolol tartrate (LOPRESSOR) 25 MG tablet TAKE 1 TABLET BY MOUTH TWICE A DAY   Netarsudil-Latanoprost (ROCKLATAN) 0.02-0.005 % SOLN Apply 1 drop to eye at bedtime. In both eyes   nitroGLYCERIN (NITROSTAT) 0.4 MG SL tablet Place 1 tablet (0.4 mg total) under the tongue every 5 (five) minutes as needed for chest  pain.   omega-3 acid ethyl esters (LOVAZA) 1 g capsule TAKE 1 CAPSULE BY MOUTH TWICE A DAY   rosuvastatin (CRESTOR) 20 MG tablet TAKE 1 TABLET BY MOUTH EVERY DAY   No facility-administered encounter medications on file as of 09/25/2021.    Care Gaps:  Star Rating Drugs:  SIG***

## 2021-10-04 DIAGNOSIS — M1712 Unilateral primary osteoarthritis, left knee: Secondary | ICD-10-CM | POA: Diagnosis not present

## 2021-10-04 DIAGNOSIS — M23307 Other meniscus derangements, unspecified meniscus, left knee: Secondary | ICD-10-CM | POA: Diagnosis not present

## 2021-10-04 DIAGNOSIS — M948X6 Other specified disorders of cartilage, lower leg: Secondary | ICD-10-CM | POA: Diagnosis not present

## 2021-10-04 DIAGNOSIS — G8918 Other acute postprocedural pain: Secondary | ICD-10-CM | POA: Diagnosis not present

## 2021-10-04 DIAGNOSIS — S83232A Complex tear of medial meniscus, current injury, left knee, initial encounter: Secondary | ICD-10-CM | POA: Diagnosis not present

## 2021-10-10 DIAGNOSIS — R269 Unspecified abnormalities of gait and mobility: Secondary | ICD-10-CM | POA: Diagnosis not present

## 2021-10-10 DIAGNOSIS — M25562 Pain in left knee: Secondary | ICD-10-CM | POA: Diagnosis not present

## 2021-10-10 DIAGNOSIS — M25662 Stiffness of left knee, not elsewhere classified: Secondary | ICD-10-CM | POA: Diagnosis not present

## 2021-10-10 DIAGNOSIS — Z9889 Other specified postprocedural states: Secondary | ICD-10-CM | POA: Diagnosis not present

## 2021-10-23 DIAGNOSIS — M25561 Pain in right knee: Secondary | ICD-10-CM | POA: Diagnosis not present

## 2021-10-23 DIAGNOSIS — M25662 Stiffness of left knee, not elsewhere classified: Secondary | ICD-10-CM | POA: Diagnosis not present

## 2021-10-23 DIAGNOSIS — Z9889 Other specified postprocedural states: Secondary | ICD-10-CM | POA: Diagnosis not present

## 2021-10-23 DIAGNOSIS — R269 Unspecified abnormalities of gait and mobility: Secondary | ICD-10-CM | POA: Diagnosis not present

## 2021-10-25 DIAGNOSIS — M25562 Pain in left knee: Secondary | ICD-10-CM | POA: Diagnosis not present

## 2021-10-25 DIAGNOSIS — R269 Unspecified abnormalities of gait and mobility: Secondary | ICD-10-CM | POA: Diagnosis not present

## 2021-10-25 DIAGNOSIS — Z9889 Other specified postprocedural states: Secondary | ICD-10-CM | POA: Diagnosis not present

## 2021-10-25 DIAGNOSIS — M25662 Stiffness of left knee, not elsewhere classified: Secondary | ICD-10-CM | POA: Diagnosis not present

## 2021-10-29 ENCOUNTER — Encounter: Payer: Self-pay | Admitting: Family Medicine

## 2021-10-29 ENCOUNTER — Other Ambulatory Visit (INDEPENDENT_AMBULATORY_CARE_PROVIDER_SITE_OTHER): Payer: Medicare HMO

## 2021-10-29 ENCOUNTER — Ambulatory Visit (INDEPENDENT_AMBULATORY_CARE_PROVIDER_SITE_OTHER): Payer: Medicare HMO | Admitting: Family Medicine

## 2021-10-29 ENCOUNTER — Telehealth: Payer: Self-pay

## 2021-10-29 DIAGNOSIS — R051 Acute cough: Secondary | ICD-10-CM

## 2021-10-29 DIAGNOSIS — E1159 Type 2 diabetes mellitus with other circulatory complications: Secondary | ICD-10-CM

## 2021-10-29 DIAGNOSIS — J209 Acute bronchitis, unspecified: Secondary | ICD-10-CM | POA: Insufficient documentation

## 2021-10-29 DIAGNOSIS — R059 Cough, unspecified: Secondary | ICD-10-CM | POA: Insufficient documentation

## 2021-10-29 LAB — COMPREHENSIVE METABOLIC PANEL
ALT: 9 U/L (ref 0–53)
AST: 11 U/L (ref 0–37)
Albumin: 3.8 g/dL (ref 3.5–5.2)
Alkaline Phosphatase: 45 U/L (ref 39–117)
BUN: 14 mg/dL (ref 6–23)
CO2: 26 mEq/L (ref 19–32)
Calcium: 9.7 mg/dL (ref 8.4–10.5)
Chloride: 103 mEq/L (ref 96–112)
Creatinine, Ser: 0.88 mg/dL (ref 0.40–1.50)
GFR: 85.99 mL/min (ref >60.00)
Glucose, Bld: 142 mg/dL — ABNORMAL HIGH (ref 70–99)
Potassium: 3.9 mEq/L (ref 3.5–5.1)
Sodium: 136 mEq/L (ref 135–145)
Total Bilirubin: 0.7 mg/dL (ref 0.2–1.2)
Total Protein: 6.8 g/dL (ref 6.0–8.3)

## 2021-10-29 LAB — LIPID PANEL
Cholesterol: 129 mg/dL (ref 0–200)
HDL: 54.7 mg/dL (ref >39.00)
LDL Cholesterol: 58 mg/dL (ref 0–99)
NonHDL: 74.3
Total CHOL/HDL Ratio: 2
Triglycerides: 80 mg/dL (ref 0.0–149.0)
VLDL: 16 mg/dL (ref 0.0–40.0)

## 2021-10-29 LAB — POC INFLUENZA A&B (BINAX/QUICKVUE): Influenza A, POC: NEGATIVE

## 2021-10-29 LAB — POC COVID19 BINAXNOW: SARS Coronavirus 2 Ag: NEGATIVE

## 2021-10-29 LAB — HEMOGLOBIN A1C: Hgb A1c MFr Bld: 6.4 % (ref 4.6–6.5)

## 2021-10-29 MED ORDER — PREDNISONE 10 MG PO TABS: ORAL_TABLET | ORAL | 0 refills | Status: DC

## 2021-10-29 NOTE — Telephone Encounter (Signed)
Appreciate Dr. Glori Bickers seeing patient.  ?

## 2021-10-29 NOTE — Patient Instructions (Addendum)
For cough - try delsym over the counter  ? ?Drink fluids  ?Take prednisone as directed  (hyper and hungry -possible side effects)  ?This will help cough and wheezing  ? ? ?If symptoms worsen- please let us know  ? ?If short of breath-go to the hospital  ?Update if not starting to improve in a week or if worsening   ? ? ? ?

## 2021-10-29 NOTE — Assessment & Plan Note (Signed)
Dry cough and some wheezing and malaise /low grade temp ?Neg covid and flu swabs today ?Reassuring exam  ?Px prednisone 30 mg /discussed side eff ?Recommend delsym for cough (expectorant if it becomes productive) ?ER precautions discussed ?Update if not starting to improve in a week or if worsening   ?Low threshold to check cxr if symptoms worsen  ?Will make pcp aware  ?

## 2021-10-29 NOTE — Telephone Encounter (Signed)
Dr Damita Dunnings asked me to triage pt who is hear for cpx labs. I spoke with pt starting on 10/28/21 pt began with dry cough, H/A pain level now is 3; lightheaded on and off with last episode of lightheadedness on 10/28/21. Pt feels achy all over with chills. Earlier this morning temp 99.1; did not take med to lower temp. Pt has upper dull abd pain when coughs.pt has slight head congestion but hurts behind eyes.  No SOB, CP, vomiting or diarrhea, no s/T or runnynose. Now temp is 98 P 91 BP 126/80 rt arm sitting with regular cuff. Pulse ox room air 94%. Pt said he is in no distress but feels really bad.no covid test and pt had 3 covid vaccines.Dr Damita Dunnings aware and scheduled pt with Dr Glori Bickers today at 11:30. Sending note to Dr Damita Dunnings, Dr Glori Bickers and Vicente Males CMA. ?

## 2021-10-29 NOTE — Progress Notes (Signed)
? ?Subjective:  ? ? Patient ID: Jorge Adams, male    DOB: 07-30-1950, 71 y.o.   MRN: [id number] ? ?HPI ?Pt presents with cough/body aches and headache ? ?Wt Readings from Last 3 Encounters:  ?10/29/21 206 lb (93.4 kg)  ?07/10/21 199 lb (90.3 kg)  ?06/26/21 199 lb (90.3 kg)  ? ?29.56 kg/m? ? ?Pt of Dr Damita Dunnings ?Former smoker/quit in 2004 ?Pulse ox today is 94% ? ?Dry cough  ?Few days  ?Not rattling  ?No wheezing  ?No sob  ? ?99.1 temp  ?Some body aches  ?Ears and throat are fine  ?Little headache  ? ?No sick contacts but was in ER with wife last week  ?Feels tired /no energy ?No appetite  ? ?Message from today: ?Dr Damita Dunnings asked me to triage pt who is hear for cpx labs. I spoke with pt starting on 10/28/21 pt began with dry cough, H/A pain level now is 3; lightheaded on and off with last episode of lightheadedness on 10/28/21. Pt feels achy all over with chills. Earlier this morning temp 99.1; did not take med to lower temp. Pt has upper dull abd pain when coughs.pt has slight head congestion but hurts behind eyes.  No SOB, CP, vomiting or diarrhea, no s/T or runnynose. Now temp is 98 P 91 BP 126/80 rt arm sitting with regular cuff. Pulse ox room air 94%. Pt said he is in no distress but feels really bad.no covid test and pt had 3 covid vaccines.Dr Damita Dunnings aware and scheduled pt with Dr Glori Bickers today at 11:30. Sending note to Dr Damita Dunnings, Dr Glori Bickers and Vicente Males CMA. ? ?Did not take anything otc  ? ? ?Results for orders placed or performed in visit on 10/29/21  ?POC COVID-19 BinaxNow  ?Result Value Ref Range  ? SARS Coronavirus 2 Ag Negative Negative  ?POC Influenza A&B(BINAX/QUICKVUE)  ?Result Value Ref Range  ? Influenza A, POC Negative Negative  ? Influenza B, POC    ? ?Patient Active Problem List  ? Diagnosis Date Noted  ? Cough 10/29/2021  ? Acute bronchitis 10/29/2021  ? Stenosis of cervical spine with myelopathy ([place]) 05/07/2018  ? Paresthesia 03/15/2018  ? Health care maintenance 09/04/2017  ? Snoring 03/05/2016  ?  Umbilical hernia 24/26/8341  ? CAD (coronary artery disease) 01/02/2016  ? Angina pectoris ([place]) 12/18/2015  ? Routine general medical examination at a health care facility 12/07/2014  ? Advance care planning 12/07/2014  ? PAD (peripheral artery disease) ([place]) 12/05/2013  ? Erectile dysfunction associated with type 2 diabetes mellitus ([place]) 10/31/2013  ? Type 2 diabetes mellitus with vascular disease ([place]) 10/31/2013  ? Senile nuclear sclerosis 06/04/2013  ? Borderline glaucoma with ocular hypertension 06/04/2013  ? Amblyopia, unspecified 06/04/2013  ? ASHD (arteriosclerotic heart disease) 01/28/2011  ? Hypogonadism, male 01/28/2011  ? Hypertension associated with diabetes ([place]) 01/28/2011  ? Hyperlipidemia LDL goal <70 01/28/2011  ? Obesity (BMI 30-39.9) 01/28/2011  ? ?Past Medical History:  ?Diagnosis Date  ? Arthritis   ? ASHD (arteriosclerotic heart disease)   ? Atherosclerosis of native arteries of the extremities with intermittent claudication   ? Cataract   ? Coronary atherosclerosis of native coronary artery   ? Diabetes mellitus   ? NIDDM  ? Dyslipidemia   ? ED (erectile dysfunction)   ? Full dentures   ? Glaucoma associated with ocular disorder, mild stage   ? bilateral  ? Hyperlipidemia   ? Hypertension   ? under control, has been on med. > 9  yr.  ? Impotence of organic origin   ? Meniscus tear 05/2012  ? right knee  ? Myocardial infarction [facility]) 10/2002  ? Stented coronary artery   ? ?Past Surgical History:  ?Procedure Laterality Date  ? ANTERIOR CERVICAL DECOMP/DISCECTOMY FUSION N/A 05/07/2018  ? Procedure: ANTERIOR CERVICAL DECOMPRESSION/DISCECTOMY FUSION CERVICAL FIVE - CERVICAL SIX;  Surgeon: Consuella Lose, MD;  Location: [place];  Service: Neurosurgery;  Laterality: N/A;  ANTERIOR CERVICAL DECOMPRESSION/DISCECTOMY FUSION CERVICAL FIVE - CERVICAL SIX  ? CARDIAC CATHETERIZATION  10/23/2002  ? had 1 stent placed then-- by Dr. Melvern Banker  ? CARDIAC CATHETERIZATION N/A 12/21/2015  ? Procedure: Left Heart  Cath and Coronary Angiography;  Surgeon: Adrian Prows, MD;  Location: [place] [facility];  Service: Cardiovascular;  Laterality: N/A;  ? CATARACT EXTRACTION Right 05/2017  ? CATARACT EXTRACTION Left 07/2017  ? COLONOSCOPY  10/08/2010  ? Dr.Patterson  ? CORONARY ARTERY BYPASS GRAFT N/A 01/02/2016  ? Procedure: CORONARY ARTERY BYPASS GRAFTING (CABG) TIMES 3 USING LEFT INTERNAL MAMMARY ARTERY AND RIGHT SAPHENOUS LEG VEIN HARVESTED ENDOSCOPICALLY;  Surgeon: Ivin Poot, MD;  Location: [place];  Service: Open Heart Surgery;  Laterality: N/A;  ? INSERTION OF MESH N/A 05/06/2016  ? Procedure: INSERTION OF MESH;  Surgeon: Georganna Skeans, MD;  Location: [place];  Service: General;  Laterality: N/A;  ? KNEE ARTHROSCOPY WITH MEDIAL MENISECTOMY  06/04/2012  ? Procedure: KNEE ARTHROSCOPY WITH MEDIAL MENISECTOMY;  Surgeon: Ninetta Lights, MD;  Location: [place];  Service: Orthopedics;  Laterality: Right;  RIGHT SCOPE MEDIAL MENISCECTOMY, CHONDROPLASTY, EXCISION LOOSE BODY  ? LOWER EXTREMITY ANGIOGRAM N/A 11/23/2013  ? Procedure: LOWER EXTREMITY ANGIOGRAM;  Surgeon: Laverda Page, MD;  Location[facility] CATH LAB;  Service: Cardiovascular;  Laterality: N/A;  ? NECK SURGERY N/A   ? 02/19/2018  ? PERIPHERAL VASCULAR CATHETERIZATION N/A 12/21/2015  ? Procedure: Abdominal Aortogram;  Surgeon: Adrian Prows, MD;  Location: [place] [facility];  Service: Cardiovascular;  Laterality: N/A;  ? PERIPHERAL VASCULAR CATHETERIZATION N/A 12/21/2015  ? Procedure: Abdominal Aortogram w/Lower Extremity;  Surgeon: Adrian Prows, MD;  Location: [place] [facility];  Service: Cardiovascular;  Laterality: N/A;  ? TEE WITHOUT CARDIOVERSION N/A 01/02/2016  ? Procedure: TRANSESOPHAGEAL ECHOCARDIOGRAM (TEE);  Surgeon: Ivin Poot, MD;  Location: [place];  Service: Open Heart Surgery;  Laterality: N/A;  ? UMBILICAL HERNIA REPAIR N/A 05/06/2016  ? Procedure: HERNIA REPAIR UMBILICAL ADULT;  Surgeon: Georganna Skeans, MD;  Location: [place];  Service:  General;  Laterality: N/A;  ? ?Social History  ? ?Tobacco Use  ? Smoking status: Former  ?  Packs/day: 2.50  ?  Years: 35.00  ?  Pack years: 87.50  ?  Types: Cigarettes  ?  Quit date: 12/02/2002  ?  Years since quitting: 18.9  ? Smokeless tobacco: Never  ?Vaping Use  ? Vaping Use: Never used  ?Substance Use Topics  ? Alcohol use: Yes  ?  Comment: rare  ? Drug use: No  ? ?Family History  ?Problem Relation Age of Onset  ? Bone cancer Mother   ? Pneumonia Father   ? Diabetes Brother   ? Heart disease Brother   ?     Pacemaker  ? COPD Brother   ? Colon cancer Neg Hx   ? Prostate cancer Neg Hx   ? Esophageal cancer Neg Hx   ? Stomach cancer Neg Hx   ? ?No Known Allergies ?Current Outpatient Medications on File Prior to Visit  ?Medication Sig  Dispense Refill  ? amLODipine-benazepril (LOTREL) 5-20 MG capsule TAKE 1 CAPSULE BY MOUTH EVERY DAY 90 capsule 3  ? aspirin EC 81 MG tablet Take 1 tablet (81 mg total) by mouth daily. 30 tablet   ? blood glucose meter kit and supplies Dispense based on pt and insurance preference. Use up to 3 times daily as directed. (Dx. E11.9). 1 each 12  ? Blood Pressure Monitoring (SPHYGMOMANOMETER) MISC Use daily to check BP.  Dx I10 1 each 0  ? brimonidine (ALPHAGAN P) 0.1 % SOLN Place 1 drop into both eyes 2 (two) times daily.    ? cilostazol (PLETAL) 100 MG tablet TAKE 1 TABLET BY MOUTH TWICE A DAY 180 tablet 1  ? dorzolamide-timolol (COSOPT) 22.3-6.8 MG/ML ophthalmic solution Place 1 drop into both eyes 2 (two) times daily.    ? fenofibrate (TRICOR) 145 MG tablet TAKE 1 TABLET BY MOUTH EVERY DAY IN THE EVENING 90 tablet 1  ? metFORMIN (GLUCOPHAGE) 500 MG tablet TAKE 1 TO 2 TABLETS BY MOUTH TWICE A DAY WITH A MEAL 360 tablet 3  ? metoprolol tartrate (LOPRESSOR) 25 MG tablet TAKE 1 TABLET BY MOUTH TWICE A DAY 180 tablet 4  ? Netarsudil-Latanoprost (ROCKLATAN) 0.02-0.005 % SOLN Apply 1 drop to eye at bedtime. In both eyes    ? nitroGLYCERIN (NITROSTAT) 0.4 MG SL tablet Place 1 tablet (0.4 mg  total) under the tongue every 5 (five) minutes as needed for chest pain. 90 tablet 2  ? omega-3 acid ethyl esters (LOVAZA) 1 g capsule TAKE 1 CAPSULE BY MOUTH TWICE A DAY 180 capsule 1  ? rosuvastatin (CRESTOR

## 2021-10-30 ENCOUNTER — Other Ambulatory Visit: Payer: Medicare HMO

## 2021-10-30 DIAGNOSIS — M25562 Pain in left knee: Secondary | ICD-10-CM | POA: Diagnosis not present

## 2021-10-30 DIAGNOSIS — Z9889 Other specified postprocedural states: Secondary | ICD-10-CM | POA: Diagnosis not present

## 2021-10-30 DIAGNOSIS — R269 Unspecified abnormalities of gait and mobility: Secondary | ICD-10-CM | POA: Diagnosis not present

## 2021-10-30 DIAGNOSIS — M25662 Stiffness of left knee, not elsewhere classified: Secondary | ICD-10-CM | POA: Diagnosis not present

## 2021-11-01 DIAGNOSIS — Z9889 Other specified postprocedural states: Secondary | ICD-10-CM | POA: Diagnosis not present

## 2021-11-01 DIAGNOSIS — M25562 Pain in left knee: Secondary | ICD-10-CM | POA: Diagnosis not present

## 2021-11-01 DIAGNOSIS — R269 Unspecified abnormalities of gait and mobility: Secondary | ICD-10-CM | POA: Diagnosis not present

## 2021-11-01 DIAGNOSIS — M25662 Stiffness of left knee, not elsewhere classified: Secondary | ICD-10-CM | POA: Diagnosis not present

## 2021-11-04 ENCOUNTER — Telehealth: Payer: Self-pay | Admitting: Family Medicine

## 2021-11-04 NOTE — Telephone Encounter (Signed)
Please get update on patient re: cough.  Thanks.  ?

## 2021-11-06 ENCOUNTER — Ambulatory Visit (INDEPENDENT_AMBULATORY_CARE_PROVIDER_SITE_OTHER): Payer: Medicare HMO | Admitting: Family Medicine

## 2021-11-06 ENCOUNTER — Telehealth: Payer: Self-pay | Admitting: Family Medicine

## 2021-11-06 ENCOUNTER — Ambulatory Visit (INDEPENDENT_AMBULATORY_CARE_PROVIDER_SITE_OTHER)
Admission: RE | Admit: 2021-11-06 | Discharge: 2021-11-06 | Disposition: A | Discharge status: Home / Self Care | Payer: Medicare HMO | Source: Ambulatory Visit | Attending: Family Medicine | Admitting: Family Medicine

## 2021-11-06 ENCOUNTER — Encounter: Payer: Self-pay | Admitting: Family Medicine

## 2021-11-06 ENCOUNTER — Other Ambulatory Visit (HOSPITAL_COMMUNITY): Payer: Self-pay

## 2021-11-06 VITALS — BP 132/80 | HR 67 | Temp 97.1°F | Ht 70.0 in | Wt 203.0 lb

## 2021-11-06 DIAGNOSIS — I209 Angina pectoris, unspecified: Secondary | ICD-10-CM | POA: Diagnosis not present

## 2021-11-06 DIAGNOSIS — R918 Other nonspecific abnormal finding of lung field: Secondary | ICD-10-CM

## 2021-11-06 DIAGNOSIS — I2510 Atherosclerotic heart disease of native coronary artery without angina pectoris: Secondary | ICD-10-CM | POA: Diagnosis not present

## 2021-11-06 DIAGNOSIS — E1159 Type 2 diabetes mellitus with other circulatory complications: Secondary | ICD-10-CM | POA: Diagnosis not present

## 2021-11-06 DIAGNOSIS — R059 Cough, unspecified: Secondary | ICD-10-CM

## 2021-11-06 DIAGNOSIS — I517 Cardiomegaly: Secondary | ICD-10-CM | POA: Diagnosis not present

## 2021-11-06 DIAGNOSIS — R079 Chest pain, unspecified: Secondary | ICD-10-CM | POA: Diagnosis not present

## 2021-11-06 DIAGNOSIS — R911 Solitary pulmonary nodule: Secondary | ICD-10-CM

## 2021-11-06 MED ORDER — HYDROCODONE BIT-HOMATROP MBR 5-1.5 MG/5ML PO SOLN: 5.0000 mL | Freq: Three times a day (TID) | ORAL | 0 refills | Status: DC | PRN

## 2021-11-06 MED ORDER — ALBUTEROL SULFATE HFA 108 (90 BASE) MCG/ACT IN AERS: 1.0000 | INHALATION_SPRAY | Freq: Four times a day (QID) | RESPIRATORY_TRACT | 2 refills | Status: AC | PRN

## 2021-11-06 MED ORDER — BENZONATATE 200 MG PO CAPS: 200.0000 mg | ORAL_CAPSULE | Freq: Three times a day (TID) | ORAL | 1 refills | Status: DC | PRN

## 2021-11-06 MED ORDER — NITROGLYCERIN 0.4 MG SL SUBL: 0.4000 mg | SUBLINGUAL_TABLET | SUBLINGUAL | 5 refills | Status: AC | PRN

## 2021-11-06 NOTE — Progress Notes (Signed)
Diabetes:  ?Using medications without difficulties: yes ?Hypoglycemic episodes:no ?Hyperglycemic episodes:no ?Feet problems:some tingling in the toes, stable.   ?Blood Sugars averaging: 114 this AM, this is typical for patient.   ?eye exam within last year: yes ?Taking metfomin 500mg  BID.   ?Recent labs discussed with patient.  A1c higher but still reasonable at 6.4. ? ?Cough.  Finishing prednisone today.   Coughing more at night.  Taking delsym at night already.  Some cough in the day.  Chest is sore from cough.  Nearly vomiting from cough.  Sputum is clear.  No fevers, no chills.  No ST.  Has a runny nose. No ear pain.  He didn't see much change in the last week. Cough comes in fits.  Can still take a deep breath but some wheeze noted.  No h/o SABA use.   ? ?Meds, vitals, and allergies reviewed.  ? ?ROS: Per HPI unless specifically indicated in ROS section  ? ?GEN: nad, alert and oriented ?HEENT: ncat ?NECK: supple w/o LA, no UAN  ?CV: rrr. ?PULM: diffuse rhonchi but no inc wob ?ABD: soft, +bs ?EXT: no edema ?SKIN: well perfused.   ? ?30 minutes were devoted to patient care in this encounter (this includes time spent reviewing the patient's file/history, interviewing and examining the patient, counseling/reviewing plan with patient).  ? ?

## 2021-11-06 NOTE — Telephone Encounter (Signed)
I talked to pt and wife.  CT ordered and rationale re: imaging d/w pt.  Will get CT and go from there.  Ddx including cancer d/w pt.  Discussed that dx isn't certain w/o biopsy but imaging is concern for cancer.  Plan agreed and support offered.  ?

## 2021-11-06 NOTE — Telephone Encounter (Signed)
Call pt.  I tried to call, no answer.  He has a likely lung mass.  He needs a follow up CT and I pended the order.  I want him to get a call from [place] prior to getting a call about the CT.  Please let me know when you talk to him.  Please tell him I called prior.  Thanks.   ?

## 2021-11-06 NOTE — Telephone Encounter (Signed)
Will discuss at OV this am. ?

## 2021-11-06 NOTE — Patient Instructions (Addendum)
Go to the lab on the way out.   If you have mychart we'll likely use that to update you.    ?Take care.  Glad to see you. ?Try albuterol then tessalon if needed.  Hycodan if needed, sedation caution.  Update me as needed.  ?Recheck at a yearly visit in about 6 months.   ?

## 2021-11-07 ENCOUNTER — Encounter: Payer: Self-pay | Admitting: *Deleted

## 2021-11-08 NOTE — Assessment & Plan Note (Signed)
No increased work of breathing or rhonchi noted on exam.  Chest x-ray independently reviewed and I called patient about the abnormal finding. ? ?CT ordered and rationale re: imaging d/w pt.  Will get CT and go from there.  Ddx including cancer d/w pt.  Discussed that dx isn't certain w/o biopsy but imaging is concern for cancer.  Plan agreed and support offered.  ?

## 2021-11-08 NOTE — Assessment & Plan Note (Signed)
Taking metfomin 500mg  BID.   ?Recent labs discussed with patient.  A1c higher but still reasonable at 6.4.  Would continue metformin as is and recheck periodically.  Labs discussed with patient. ?

## 2021-11-09 ENCOUNTER — Telehealth: Payer: Self-pay

## 2021-11-09 ENCOUNTER — Ambulatory Visit
Admission: RE | Admit: 2021-11-09 | Discharge: 2021-11-09 | Disposition: A | Discharge status: Home / Self Care | Payer: Medicare HMO | Source: Ambulatory Visit | Attending: Family Medicine | Admitting: Family Medicine

## 2021-11-09 DIAGNOSIS — R918 Other nonspecific abnormal finding of lung field: Secondary | ICD-10-CM

## 2021-11-09 DIAGNOSIS — I700 Atherosclerosis of aorta: Secondary | ICD-10-CM | POA: Diagnosis not present

## 2021-11-09 DIAGNOSIS — J9859 Other diseases of mediastinum, not elsewhere classified: Secondary | ICD-10-CM | POA: Diagnosis not present

## 2021-11-09 DIAGNOSIS — I2510 Atherosclerotic heart disease of native coronary artery without angina pectoris: Secondary | ICD-10-CM | POA: Diagnosis not present

## 2021-11-09 MED ORDER — IOHEXOL 300 MG/ML  SOLN
75.0000 mL | Freq: Once | INTRAMUSCULAR | Status: AC | PRN
  Administered 2021-11-09: 75 mL via INTRAVENOUS

## 2021-11-09 NOTE — Telephone Encounter (Signed)
Called x2 to speak with patient regarding CT results - went straight to voicemail. ?Will try to call later.  ?

## 2021-11-09 NOTE — Telephone Encounter (Signed)
Spoke with patient regarding CT findings. ?Stat pulm referral placed.  ?Discussed we will call him Monday to get this set up. He prefers to be seen in [place].  ?

## 2021-11-09 NOTE — Telephone Encounter (Signed)
Stat report called in from [place]. CT report is in Epic. Dr. Danise Mina made aware verbally in absence of Dr. Damita Dunnings. Forwarding to Dr. Darnell Level and Dr. Damita Dunnings.  ?

## 2021-11-12 ENCOUNTER — Other Ambulatory Visit: Payer: Self-pay | Admitting: Family Medicine

## 2021-11-12 NOTE — Telephone Encounter (Signed)
Referral sent to Pulm ? ?Pt notified  ?

## 2021-11-12 NOTE — Telephone Encounter (Signed)
Pts wife called, requesting refill on Hydrocodone cough syrup.  ?

## 2021-11-12 NOTE — Telephone Encounter (Addendum)
[facility] Night - Client ?Nonclinical Telephone Record  ?[facility]? ?Client [place] Night - Client ?Client Site [place] ?Contact Type Call ?Who Is Calling Patient / Member / Family / Caregiver ?Caller Name Jorge Adams ?Caller Phone Number [phone] ?Call Type Message Only Information Provided ?Reason for Call Returning a Call from the Office ?Initial Comment Caller states is retuning a call. ?Additional Comment Thank you ?Disp. Time Disposition Final User ?11/09/2021 5:14:43 PM General Information Provided Yes Jorge Adams ?Call Closed By: Jorge Adams ?Transaction Date/Time: 11/09/2021 5:09:31 PM (ET ? ? ? ?Per this phone note Dr Darnell Level spoke with pt on 11/09/21 at 5:32.pm. per this note [name] has already spoken with pt this morning. Sending note to DR G and Dr Damita Dunnings as Juluis Rainier. ?

## 2021-11-12 NOTE — Telephone Encounter (Signed)
Also needs hydrocodone syrup refilled ?

## 2021-11-13 MED ORDER — HYDROCODONE BIT-HOMATROP MBR 5-1.5 MG/5ML PO SOLN: 5.0000 mL | Freq: Three times a day (TID) | ORAL | 0 refills | Status: DC | PRN

## 2021-11-13 NOTE — Telephone Encounter (Signed)
Noted. I thank all involved.  ?

## 2021-11-13 NOTE — Telephone Encounter (Signed)
Sent. Thanks.   

## 2021-11-13 NOTE — Addendum Note (Signed)
Addended by: [name] on: 11/13/2021 09:01 AM ? ? Modules accepted: Orders ? ?

## 2021-11-27 ENCOUNTER — Encounter: Payer: Self-pay | Admitting: Emergency Medicine

## 2021-11-27 ENCOUNTER — Ambulatory Visit: Payer: Medicare HMO | Admitting: Emergency Medicine

## 2021-11-27 VITALS — BP 130/76 | HR 80 | Temp 98.3°F | Ht 70.0 in | Wt 200.0 lb

## 2021-11-27 DIAGNOSIS — R918 Other nonspecific abnormal finding of lung field: Secondary | ICD-10-CM

## 2021-11-27 NOTE — H&P (View-Only) (Signed)
? ?Subjective:  ? ? Patient ID: Jorge Adams, male    DOB: [birth year], 71 y.o.   MRN: [id number] ? ?HPI ?71 year old male with history of tobacco use (88 pack years), diabetes, hypertension, CAD/PTCI/CABG.  He has been evaluated by Dr. Damita Dunnings for persistent cough.  Prompted a chest x-ray on 4/18 and then subsequently a CT scan of the chest which prompted referral today. ?He reports cough, no hemoptysis.  Some sputum production.  No significant change in his exertional tolerance ? ?CT scan of the chest 11/09/2021 reviewed by me shows a large rounded irregular right lower lobe mass with pleural involvement at the major fissure.  There is an apparent endobronchial lesion and satellite metastatic disease, possible local lymphangitic carcinomatosis extending up into the right upper lobe with associated mediastinal adenopathy. ? ?Review of Systems ?As per HPI ? ?Past Medical History:  ?Diagnosis Date  ? Arthritis   ? ASHD (arteriosclerotic heart disease)   ? Atherosclerosis of native arteries of the extremities with intermittent claudication   ? Cataract   ? Coronary atherosclerosis of native coronary artery   ? Diabetes mellitus   ? NIDDM  ? Dyslipidemia   ? ED (erectile dysfunction)   ? Full dentures   ? Glaucoma associated with ocular disorder, mild stage   ? bilateral  ? Hyperlipidemia   ? Hypertension   ? under control, has been on med. > 9 yr.  ? Impotence of organic origin   ? Meniscus tear 05/2012  ? right knee  ? Myocardial infarction [facility]) 10/2002  ? Stented coronary artery   ?  ? ?Family History  ?Problem Relation Age of Onset  ? Bone cancer Mother   ? Pneumonia Father   ? Diabetes Brother   ? Heart disease Brother   ?     Pacemaker  ? COPD Brother   ? Colon cancer Neg Hx   ? Prostate cancer Neg Hx   ? Esophageal cancer Neg Hx   ? Stomach cancer Neg Hx   ?  ? ?Social History  ? ?Socioeconomic History  ? Marital status: Married  ?  Spouse name: Not on file  ? Number of children: 2  ? Years of education: Not  on file  ? Highest education level: Not on file  ?Occupational History  ? Occupation: retired  ?Tobacco Use  ? Smoking status: Former  ?  Packs/day: 2.50  ?  Years: 35.00  ?  Pack years: 87.50  ?  Types: Cigarettes  ?  Quit date: 12/02/2002  ?  Years since quitting: 19.0  ? Smokeless tobacco: Never  ?Vaping Use  ? Vaping Use: Never used  ?Substance and Sexual Activity  ? Alcohol use: Yes  ?  Comment: rare  ? Drug use: No  ? Sexual activity: Not on file  ?Other Topics Concern  ? Not on file  ?Social History Narrative  ? 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 Married 2012, 2 grown sons from prev relationship  ? Enjoys fishing.   ? Doristine Bosworth, [employer] fan  ? ?Social Determinants of Health  ? ?Financial Resource Strain: Low Risk   ? Difficulty of Paying Living Expenses: Not hard at all  ?Food Insecurity: No Food Insecurity  ? Worried About Charity fundraiser in the Last Year: Never true  ? Ran Out of Food in the Last Year: Never true  ?Transportation Needs: No Transportation Needs  ? Lack of Transportation (Medical): No  ?  Lack of Transportation (Non-Medical): No  ?Physical Activity: Inactive  ? Days of Exercise per Week: 0 days  ? Minutes of Exercise per Session: 0 min  ?Stress: No Stress Concern Present  ? Feeling of Stress : Not at all  ?Social Connections: Not on file  ?Intimate Partner Violence: Not At Risk  ? Fear of Current or Ex-Partner: No  ? Emotionally Abused: No  ? Physically Abused: No  ? Sexually Abused: No  ?  ?[place] native,  ?Worked in [place] [employer].  ? ?No Known Allergies  ? ?Outpatient Medications Prior to Visit  ?Medication Sig Dispense Refill  ? albuterol (VENTOLIN HFA) 108 (90 Base) MCG/ACT inhaler Inhale 1-2 puffs into the lungs every 6 (six) hours as needed (for cough.  okay to fill with albuterol/proair/ventolin.). 8 g 2  ? amLODipine-benazepril (LOTREL) 5-20 MG capsule TAKE 1 CAPSULE BY MOUTH EVERY DAY 90 capsule 3   ? aspirin EC 81 MG tablet Take 1 tablet (81 mg total) by mouth daily. 30 tablet   ? benzonatate (TESSALON) 200 MG capsule Take 1 capsule (200 mg total) by mouth 3 (three) times daily as needed. 30 capsule 1  ? blood glucose meter kit and supplies Dispense based on pt and insurance preference. Use up to 3 times daily as directed. (Dx. E11.9). 1 each 12  ? brimonidine (ALPHAGAN P) 0.1 % SOLN Place 1 drop into both eyes 2 (two) times daily.    ? cilostazol (PLETAL) 100 MG tablet TAKE 1 TABLET BY MOUTH TWICE A DAY 180 tablet 1  ? dorzolamide-timolol (COSOPT) 22.3-6.8 MG/ML ophthalmic solution Place 1 drop into both eyes 2 (two) times daily.    ? fenofibrate (TRICOR) 145 MG tablet TAKE 1 TABLET BY MOUTH EVERY DAY IN THE EVENING 90 tablet 1  ? HYDROcodone bit-homatropine (HYCODAN) 5-1.5 MG/5ML syrup Take 5 mLs by mouth every 8 (eight) hours as needed for cough (sedation caution.). 120 mL 0  ? metFORMIN (GLUCOPHAGE) 500 MG tablet TAKE 1 TO 2 TABLETS BY MOUTH TWICE A DAY WITH A MEAL 360 tablet 3  ? metoprolol tartrate (LOPRESSOR) 25 MG tablet TAKE 1 TABLET BY MOUTH TWICE A DAY 180 tablet 4  ? Netarsudil-Latanoprost (ROCKLATAN) 0.02-0.005 % SOLN Apply 1 drop to eye at bedtime. In both eyes    ? nitroGLYCERIN (NITROSTAT) 0.4 MG SL tablet Place 1 tablet (0.4 mg total) under the tongue every 5 (five) minutes as needed for chest pain. 25 tablet 5  ? omega-3 acid ethyl esters (LOVAZA) 1 g capsule TAKE 1 CAPSULE BY MOUTH TWICE A DAY 180 capsule 1  ? rosuvastatin (CRESTOR) 20 MG tablet TAKE 1 TABLET BY MOUTH EVERY DAY 90 tablet 0  ? Blood Pressure Monitoring (SPHYGMOMANOMETER) MISC Use daily to check BP.  Dx I10 1 each 0  ? ?No facility-administered medications prior to visit.  ? ? ? ? ?   ?Objective:  ? Physical Exam ? ?Vitals:  ? 11/27/21 1027  ?BP: 130/76  ?Pulse: 80  ?Temp: 98.3 ?F (36.8 ?C)  ?TempSrc: Oral  ?SpO2: 96%  ?Weight: 200 lb (90.7 kg)  ?Height: 5' 10" (1.778 m)  ? ?Gen: Pleasant, well-nourished, in no distress,   normal affect ? ?ENT: No lesions,  mouth clear,  oropharynx clear, no postnasal drip ? ?Neck: No JVD, no stridor ? ?Lungs: No use of accessory muscles, bilateral inspiratory wheezes more prominent on the right ? ?Cardiovascular: RRR, heart sounds normal, no murmur or gallops, no peripheral edema ? ?  Musculoskeletal: No deformities, no cyanosis or clubbing ? ?Neuro: alert, awake, non focal ? ?Skin: Warm, no lesions or rash ? ?   ?Assessment & Plan:  ?Mass of lower lobe of right lung ?Mr. Clapper needs a bronchoscopy for tissue diagnosis soon as possible.  He is on Pletal and aspirin and we will hold these as appropriate, plan for bronchoscopy on 5/16.  He understands rationale, risk, benefits and elects to proceed. ? ?We will work on setting up bronchoscopy to evaluate right lower lobe mass.  This will be done under general anesthesia at [facility] endoscopy.  You will need a designated driver.  We are working on setting up for 12/04/2021.  You will need to stop your Pletal 5 days prior, aspirin 2 days prior. ?Follow Dr. Lamonte Sakai in 1 month or next available ? ?Baltazar Apo, MD, PhD ?11/27/2021, 1:41 PM ?[facility] ?[phone] or if no answer before 7:00PM call [phone] ?For any issues after 7:00PM please call eLink [phone] ? ? ?

## 2021-11-27 NOTE — Progress Notes (Signed)
? ?Subjective:  ? ? Patient ID: Jorge Adams, male    DOB: 07/20/1951, 71 y.o.   MRN: [id number] ? ?HPI ?71-year-old male with history of tobacco use (88 pack years), diabetes, hypertension, CAD/PTCI/CABG.  He has been evaluated by Dr. Duncan for persistent cough.  Prompted a chest x-ray on 4/18 and then subsequently a CT scan of the chest which prompted referral today. ?He reports cough, no hemoptysis.  Some sputum production.  No significant change in his exertional tolerance ? ?CT scan of the chest 11/09/2021 reviewed by me shows a large rounded irregular right lower lobe mass with pleural involvement at the major fissure.  There is an apparent endobronchial lesion and satellite metastatic disease, possible local lymphangitic carcinomatosis extending up into the right upper lobe with associated mediastinal adenopathy. ? ?Review of Systems ?As per HPI ? ?Past Medical History:  ?Diagnosis Date  ? Arthritis   ? ASHD (arteriosclerotic heart disease)   ? Atherosclerosis of native arteries of the extremities with intermittent claudication   ? Cataract   ? Coronary atherosclerosis of native coronary artery   ? Diabetes mellitus   ? NIDDM  ? Dyslipidemia   ? ED (erectile dysfunction)   ? Full dentures   ? Glaucoma associated with ocular disorder, mild stage   ? bilateral  ? Hyperlipidemia   ? Hypertension   ? under control, has been on med. > 9 yr.  ? Impotence of organic origin   ? Meniscus tear 05/2012  ? right knee  ? Myocardial infarction (HCC) 10/2002  ? Stented coronary artery   ?  ? ?Family History  ?Problem Relation Age of Onset  ? Bone cancer Mother   ? Pneumonia Father   ? Diabetes Brother   ? Heart disease Brother   ?     Pacemaker  ? COPD Brother   ? Colon cancer Neg Hx   ? Prostate cancer Neg Hx   ? Esophageal cancer Neg Hx   ? Stomach cancer Neg Hx   ?  ? ?Social History  ? ?Socioeconomic History  ? Marital status: Married  ?  Spouse name: Not on file  ? Number of children: 2  ? Years of education: Not  on file  ? Highest education level: Not on file  ?Occupational History  ? Occupation: retired  ?Tobacco Use  ? Smoking status: Former  ?  Packs/day: 2.50  ?  Years: 35.00  ?  Pack years: 87.50  ?  Types: Cigarettes  ?  Quit date: 12/02/2002  ?  Years since quitting: 19.0  ? Smokeless tobacco: Never  ?Vaping Use  ? Vaping Use: Never used  ?Substance and Sexual Activity  ? Alcohol use: Yes  ?  Comment: rare  ? Drug use: No  ? Sexual activity: Not on file  ?Other Topics Concern  ? Not on file  ?Social History Narrative  ? 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 Married 2012, 2 grown sons from prev relationship  ? Enjoys fishing.   ? [employer], [employer], [employer] fan  ? ?Social Determinants of Health  ? ?Financial Resource Strain: Low Risk   ? Difficulty of Paying Living Expenses: Not hard at all  ?Food Insecurity: No Food Insecurity  ? Worried About Running Out of Food in the Last Year: Never true  ? Ran Out of Food in the Last Year: Never true  ?Transportation Needs: No Transportation Needs  ? Lack of Transportation (Medical): No  ?   Lack of Transportation (Non-Medical): No  ?Physical Activity: Inactive  ? Days of Exercise per Week: 0 days  ? Minutes of Exercise per Session: 0 min  ?Stress: No Stress Concern Present  ? Feeling of Stress : Not at all  ?Social Connections: Not on file  ?Intimate Partner Violence: Not At Risk  ? Fear of Current or Ex-Partner: No  ? Emotionally Abused: No  ? Physically Abused: No  ? Sexually Abused: No  ?  ?[place] native,  ?Worked in [employer] and [employer] ?[employer].  ? ?No Known Allergies  ? ?Outpatient Medications Prior to Visit  ?Medication Sig Dispense Refill  ? albuterol (VENTOLIN HFA) 108 (90 Base) MCG/ACT inhaler Inhale 1-2 puffs into the lungs every 6 (six) hours as needed (for cough.  okay to fill with albuterol/proair/ventolin.). 8 g 2  ? amLODipine-benazepril (LOTREL) 5-20 MG capsule TAKE 1 CAPSULE BY MOUTH EVERY DAY 90 capsule 3   ? aspirin EC 81 MG tablet Take 1 tablet (81 mg total) by mouth daily. 30 tablet   ? benzonatate (TESSALON) 200 MG capsule Take 1 capsule (200 mg total) by mouth 3 (three) times daily as needed. 30 capsule 1  ? blood glucose meter kit and supplies Dispense based on pt and insurance preference. Use up to 3 times daily as directed. (Dx. E11.9). 1 each 12  ? brimonidine (ALPHAGAN P) 0.1 % SOLN Place 1 drop into both eyes 2 (two) times daily.    ? cilostazol (PLETAL) 100 MG tablet TAKE 1 TABLET BY MOUTH TWICE A DAY 180 tablet 1  ? dorzolamide-timolol (COSOPT) 22.3-6.8 MG/ML ophthalmic solution Place 1 drop into both eyes 2 (two) times daily.    ? fenofibrate (TRICOR) 145 MG tablet TAKE 1 TABLET BY MOUTH EVERY DAY IN THE EVENING 90 tablet 1  ? HYDROcodone bit-homatropine (HYCODAN) 5-1.5 MG/5ML syrup Take 5 mLs by mouth every 8 (eight) hours as needed for cough (sedation caution.). 120 mL 0  ? metFORMIN (GLUCOPHAGE) 500 MG tablet TAKE 1 TO 2 TABLETS BY MOUTH TWICE A DAY WITH A MEAL 360 tablet 3  ? metoprolol tartrate (LOPRESSOR) 25 MG tablet TAKE 1 TABLET BY MOUTH TWICE A DAY 180 tablet 4  ? Netarsudil-Latanoprost (ROCKLATAN) 0.02-0.005 % SOLN Apply 1 drop to eye at bedtime. In both eyes    ? nitroGLYCERIN (NITROSTAT) 0.4 MG SL tablet Place 1 tablet (0.4 mg total) under the tongue every 5 (five) minutes as needed for chest pain. 25 tablet 5  ? omega-3 acid ethyl esters (LOVAZA) 1 g capsule TAKE 1 CAPSULE BY MOUTH TWICE A DAY 180 capsule 1  ? rosuvastatin (CRESTOR) 20 MG tablet TAKE 1 TABLET BY MOUTH EVERY DAY 90 tablet 0  ? Blood Pressure Monitoring (SPHYGMOMANOMETER) MISC Use daily to check BP.  Dx I10 1 each 0  ? ?No facility-administered medications prior to visit.  ? ? ? ? ?   ?Objective:  ? Physical Exam ? ?Vitals:  ? 11/27/21 1027  ?BP: 130/76  ?Pulse: 80  ?Temp: 98.3 ?F (36.8 ?C)  ?TempSrc: Oral  ?SpO2: 96%  ?Weight: 200 lb (90.7 kg)  ?Height: 5' 10" (1.778 m)  ? ?Gen: Pleasant, well-nourished, in no distress,   normal affect ? ?ENT: No lesions,  mouth clear,  oropharynx clear, no postnasal drip ? ?Neck: No JVD, no stridor ? ?Lungs: No use of accessory muscles, bilateral inspiratory wheezes more prominent on the right ? ?Cardiovascular: RRR, heart sounds normal, no murmur or gallops, no peripheral edema ? ?  Musculoskeletal: No deformities, no cyanosis or clubbing ? ?Neuro: alert, awake, non focal ? ?Skin: Warm, no lesions or rash ? ?   ?Assessment & Plan:  ?Mass of lower lobe of right lung ?Mr. Clapper needs a bronchoscopy for tissue diagnosis soon as possible.  He is on Pletal and aspirin and we will hold these as appropriate, plan for bronchoscopy on 5/16.  He understands rationale, risk, benefits and elects to proceed. ? ?We will work on setting up bronchoscopy to evaluate right lower lobe mass.  This will be done under general anesthesia at [facility] endoscopy.  You will need a designated driver.  We are working on setting up for 12/04/2021.  You will need to stop your Pletal 5 days prior, aspirin 2 days prior. ?Follow Dr. Lamonte Sakai in 1 month or next available ? ?Baltazar Apo, MD, PhD ?11/27/2021, 1:41 PM ?[facility] ?[phone] or if no answer before 7:00PM call [phone] ?For any issues after 7:00PM please call eLink [phone] ? ? ?

## 2021-11-27 NOTE — Assessment & Plan Note (Signed)
Jorge Adams needs a bronchoscopy for tissue diagnosis soon as possible.  He is on Pletal and aspirin and we will hold these as appropriate, plan for bronchoscopy on 5/16.  He understands rationale, risk, benefits and elects to proceed. ? ?We will work on setting up bronchoscopy to evaluate right lower lobe mass.  This will be done under general anesthesia at [facility] endoscopy.  You will need a designated driver.  We are working on setting up for 12/04/2021.  You will need to stop your Pletal 5 days prior, aspirin 2 days prior. ?Follow Dr. Lamonte Sakai in 1 month or next available ?

## 2021-11-27 NOTE — Patient Instructions (Signed)
We will work on setting up bronchoscopy to evaluate right lower lobe mass.  This will be done under general anesthesia at [facility] endoscopy.  You will need a designated driver.  We are working on setting up for 12/04/2021.  You will need to stop your Pletal 5 days prior, aspirin 2 days prior. ?Follow Dr. Lamonte Sakai in 1 month or next available ?

## 2021-11-29 ENCOUNTER — Telehealth: Payer: Self-pay | Admitting: Family Medicine

## 2021-11-29 MED ORDER — HYDROCODONE BIT-HOMATROP MBR 5-1.5 MG/5ML PO SOLN: 5.0000 mL | Freq: Three times a day (TID) | ORAL | 0 refills | Status: DC | PRN

## 2021-11-29 NOTE — Telephone Encounter (Signed)
Sent. Thanks.   

## 2021-11-29 NOTE — Telephone Encounter (Signed)
Caller Name: Nathanal Hermiz  ?Call back phone #: [phone] ? ?MEDICATION(S): HYDROcodone bit-homatropine (HYCODAN) 5-1.5 MG/5ML syrup ? ? ?Days of Med Remaining: none ? ?Has the patient contacted their pharmacy (YES/NO)?  Yes, controlled ?IF YES, when and what did the pharmacy advise?  ?IF NO, request that the patient contact the pharmacy for the refills in the future.  ?           The pharmacy will send an electronic request (except for controlled medications). ? ?Preferred Pharmacy: [facility] [place] ? ?~~~Please advise patient/caregiver to allow 2-3 business days to process RX refills.  ?

## 2021-11-30 ENCOUNTER — Other Ambulatory Visit: Payer: Self-pay | Admitting: Emergency Medicine

## 2021-11-30 LAB — SARS CORONAVIRUS 2 (TAT 6-24 HRS): SARS Coronavirus 2: NEGATIVE

## 2021-12-03 ENCOUNTER — Encounter (HOSPITAL_COMMUNITY): Payer: Self-pay | Admitting: Emergency Medicine

## 2021-12-03 ENCOUNTER — Other Ambulatory Visit: Payer: Self-pay

## 2021-12-03 DIAGNOSIS — H401131 Primary open-angle glaucoma, bilateral, mild stage: Secondary | ICD-10-CM | POA: Diagnosis not present

## 2021-12-03 NOTE — Anesthesia Preprocedure Evaluation (Addendum)
Anesthesia Evaluation  ?Patient identified by MRN, date of birth, ID band ?Patient awake ? ? ? ?Reviewed: ?Allergy & Precautions, H&P , NPO status , Patient's Chart, lab work & pertinent test results, reviewed documented beta blocker date and time  ? ?Airway ?Mallampati: II ? ?TM Distance: >3 FB ?Neck ROM: full ? ? ? Dental ?no notable dental hx. ?(+) Edentulous Upper, Edentulous Lower ?  ?Pulmonary ?neg pulmonary ROS, former smoker,  ?  ?Pulmonary exam normal ?breath sounds clear to auscultation ? ? ? ? ? ? Cardiovascular ?Exercise Tolerance: Good ?hypertension, Pt. on medications and Pt. on home beta blockers ?+ CAD, + Past MI and + Peripheral Vascular Disease  ? ?Rhythm:regular Rate:Normal ? ? ?  ?Neuro/Psych ?Stenosis of cervical spine with myelopathy  ?negative psych ROS  ? GI/Hepatic ?negative GI ROS, Neg liver ROS,   ?Endo/Other  ?diabetes, Type 2 ? Renal/GU ?negative Renal ROS  ?negative genitourinary ?  ?Musculoskeletal ? ?(+) Arthritis , Osteoarthritis,   ? Abdominal ?  ?Peds ? Hematology ?negative hematology ROS ?(+)   ?Anesthesia Other Findings ? ? Reproductive/Obstetrics ?negative OB ROS ? ?  ? ? ? ? ? ? ? ? ? ? ? ? ? ?  ?  ? ? ? ? ? ? ?Anesthesia Physical ?Anesthesia Plan ? ?ASA: 4 ? ?Anesthesia Plan: General  ? ?Post-op Pain Management: Minimal or no pain anticipated  ? ?Induction: Intravenous ? ?PONV Risk Score and Plan: 2 and Ondansetron, Treatment may vary due to age or medical condition and Dexamethasone ? ?Airway Management Planned: Oral ETT ? ?Additional Equipment: None ? ?Intra-op Plan:  ? ?Post-operative Plan: Extubation in OR ? ?Informed Consent: I have reviewed the patients History and Physical, chart, labs and discussed the procedure including the risks, benefits and alternatives for the proposed anesthesia with the patient or authorized representative who has indicated his/her understanding and acceptance.  ? ? ? ?Dental Advisory Given ? ?Plan Discussed  with: CRNA and Anesthesiologist ? ?Anesthesia Plan Comments: (PAT note by Karoline Caldwell, PA-C: ?[place] cardiology for history of CAD s/p CABG x3 in 2017, PAD with lower extremity claudication, HTN.  Last seen by Dr. Einar Gip on 04/19/2021 noted to be doing well from cardiac standpoint, yearly follow-up recommended. ? ?Patient recently evaluated by PCP Dr. Elsie Stain for persistent cough. Prompted a chest x-ray on 4/18 and then subsequent referral to pulmonology. Per Dr. Agustina Caroli note, CT scan of the chest 11/09/2021 showed a large rounded irregular right lower lobe mass with pleural involvement at the major fissure. ?There is an apparent endobronchial lesion and satellite metastatic disease, possible local lymphangitic carcinomatosis extending up into the right upper lobe with associated mediastinal adenopathy. Bronchoscopy recommended.  ? ?Pt reports LD Pletal 11/23/21. LD ASA 11/30/21. ? ?Pt will need DOS labs and eval. ? ?EKG 04/19/21: Normal sinus rhythm at rate of 66 bpm, normal axis.  Poor R wave progression, probably normal variant.  Nonspecific T abnormality. ? ?CT Chest 11/09/21: ?IMPRESSION: ?1. Large RIGHT lower lobe mass with frank pleural involvement of the ?major fissure, endobronchial invasion, satellite metastases, ?suspected local lymphangitic carcinomatosis that extend into the ?upper lobe and mediastinal adenopathy. Mass also extends along a ?long segment of the medial pleural surface in the RIGHT chest with ?signs of invasion of extrapleural fat. ?2. Findings that suggest liver disease/cirrhosis and early portal ?hypertension, correlate clinically. ?3. Cholelithiasis. ?4. Post median sternotomy for CABG. ?5. Aortic atherosclerosis. ?? ?[place]isc[place] Myov[place] Stress Test?03/22/2019: ?1. Stress EKG is non-diagnostic, as this is pharmacological stress  test. Myocardial perfusion imaging is normal. Left ventricular ejection fraction is ?66% with normal wall motion. Low risk study. ?2. Compared to the study done on  12/08/2015, inferior ischemia not present. ? ?Echocardiogram 03/16/2019: ?Left ventricle cavity is normal in size. Moderate concentric hypertrophy of the left ventricle. Normal LV systolic function with EF 55%. Abnormal septal wall motion due to post-operative coronary artery bypass graft. Doppler evidence of grade I (impaired) diastolic dysfunction, normal LAP. Calculated EF 55%. ?Right atrial cavity is mildly dilated. ?Right ventricle cavity is mildly dilated. Normal right ventricular function. ?Mild (Grade I) mitral regurgitation. ?Trace tricuspid regurgitation. ?IVC is dilated with blunted respiratory response. Estimated RA pressure 10-15 mmHg.  ?Compared to previous study in 2017, elevated right atrial pressure is new.  ?? ?Lower extremity arterial duplex 12/13/2015:?No hemodynamically significant stenoses are identified in the right lower extremity arterial system. Moderate velocity increase at the left distal superficial femoral artery suggests >?50% stenosis. Diffuse mixed plaque noted both lower extremities. This exam reveals mildly decreased perfusion of the right lower extremity with ABI 0.96 and moderately decreased perfusion of the left lower extremity with ABI 0.69 noted at the post tibial artery level. When compared to ABI done on 07/04/2011, ABI was normal bilaterally. ?)  ? ? ? ? ? ?Anesthesia Quick Evaluation ? ?

## 2021-12-03 NOTE — Progress Notes (Signed)
PCP - Elsie Stain, MD ?Cardiologist - Adrian Prows, MD ? ?PPM/ICD - denies ?Device Orders - n/a ?Rep Notified - n/a ? ?Chest x-ray - 11/06/2021 ?EKG - 04/19/2021 ?Stress Test - 03/22/2019 ?ECHO - 03/16/2019 ?Cardiac Cath - 2004 ? ?CPAP - n/a ? ?Fasting Blood Sugar - 100 - 105 ?Checks Blood Sugar 1/day ? ?Blood Thinner Instructions: Pletal - last dose - 11/23/21 per patient ?Aspirin Instructions: - last dose - 11/30/21 per patient ?Patient was instructed: As of today, STOP taking any Aspirin (unless otherwise instructed by your surgeon) Aleve, Naproxen, Ibuprofen, Motrin, Advil, Goody's, BC's, all herbal medications, fish oil, and all vitamins. ? ?ERAS Protcol - n/a ? ?COVID TEST- n/a - COVID test negativ on 05/12/2-023 ? ?Anesthesia review: yes - cardiac history ? ?Patient verbally denies any shortness of breath, fever, cough and chest pain during phone call ? ? ?-------------  SDW INSTRUCTIONS given: ? ?Your procedure is scheduled on Tuesday, May 16th, 2023. ? Report to [facility] Main Entrance "A" at 10:15 A.M., and check in at the Admitting office. ? Call this number if you have problems the morning of surgery: ? [phone] ? ? Remember: ? Do not eat or drink after midnight the night before your surgery ?  ? Take these medicines the morning of surgery with A SIP OF WATER Metoprolol, Crestor, eye drops; PRN: Tessalon, Nitroglycerin, Albuterol (Please bring the inhaler with you the day of surgery). ? ?As of today, STOP taking any Aspirin (unless otherwise instructed by your surgeon) Aleve, Naproxen, Ibuprofen, Motrin, Advil, Goody's, BC's, all herbal medications, fish oil, and all vitamins. ? ? ?Do not take Metformin the morning of surgery. ? ?How do I manage my blood sugar before surgery? ?Check your blood sugar at least 4 times a day, starting 2 days before surgery, to make sure that the level is not too high or low. ? ?Check your blood sugar the morning of your surgery when you wake up and every 2 hours until  you get to the Short Stay unit. ? ?If your blood sugar is less than 70 mg/dL, you will need to treat for low blood sugar: ?Do not take insulin. ?Treat a low blood sugar (less than 70 mg/dL) with ? cup of clear juice (cranberry or apple), 4 glucose tablets, OR glucose gel. ?Recheck blood sugar in 15 minutes after treatment (to make sure it is greater than 70 mg/dL). If your blood sugar is not greater than 70 mg/dL on recheck, call [phone] for further instructions. ?Report your blood sugar to the short stay nurse when you get to Short Stay. ? ? The day of surgery: ?         ?           Do not wear jewelry ?           Do not wear lotions, powders, colognes, or deodorant. ?           Men may shave face and neck. ?           Do not bring valuables to the hospital. ?           [facility] is not responsible for any belongings or valuables. ? ?Do NOT Smoke (Tobacco/Vaping) 24 hours prior to your procedure ?If you use a CPAP at night, you may bring all equipment for your overnight stay. ?  ?Contacts, glasses, dentures or bridgework may not be worn into surgery.    ?  ?For patients admitted to the hospital,  discharge time will be determined by your treatment team. ?  ?Patients discharged the day of surgery will not be allowed to drive home, and someone needs to stay with them for 24 hours. ? ? ?Special instructions:   ?[place]- Preparing For Surgery ? ?Before surgery, you can play an important role. Because skin is not sterile, your skin needs to be as free of germs as possible. You can reduce the number of germs on your skin by washing with CHG (chlorahexidine gluconate) Soap before surgery.  CHG is an antiseptic cleaner which kills germs and bonds with the skin to continue killing germs even after washing.   ? ?Oral Hygiene is also important to reduce your risk of infection.  Remember - BRUSH YOUR TEETH THE MORNING OF SURGERY WITH YOUR REGULAR TOOTHPASTE ? ?Please do not use if you have an allergy to CHG or  antibacterial soaps. If your skin becomes reddened/irritated stop using the CHG.  ?Do not shave (including legs and underarms) for at least 48 hours prior to first CHG shower. It is OK to shave your face. ? ?Please follow these instructions carefully. ?  ?Shower the NIGHT BEFORE SURGERY and the MORNING OF SURGERY with DIAL Soap.  ? ?Pat yourself dry with a CLEAN TOWEL. ? ?Wear CLEAN PAJAMAS to bed the night before surgery ? ?Place CLEAN SHEETS on your bed the night of your first shower and DO NOT SLEEP WITH PETS. ? ? ?Day of Surgery: ?Please shower morning of surgery  ?Wear Clean/Comfortable clothing the morning of surgery ?Do not apply any deodorants/lotions.   ?Remember to brush your teeth WITH YOUR REGULAR TOOTHPASTE. ?  ?Questions were answered. Patient verbalized understanding of instructions.  ? ? ?    ?

## 2021-12-03 NOTE — Progress Notes (Signed)
Anesthesia Chart Review: ?Same day workup ? ?[place] cardiology for history of CAD s/p CABG x3 in 2017, PAD with lower extremity claudication, HTN.  Last seen by Dr. Einar Gip on 04/19/2021 noted to be doing well from cardiac standpoint, yearly follow-up recommended. ? ?Patient recently evaluated by PCP Dr. Elsie Stain for persistent cough. Prompted a chest x-ray on 4/18 and then subsequent referral to pulmonology. Per Dr. Agustina Caroli note, CT scan of the chest 11/09/2021 showed a large rounded irregular right lower lobe mass with pleural involvement at the major fissure.  There is an apparent endobronchial lesion and satellite metastatic disease, possible local lymphangitic carcinomatosis extending up into the right upper lobe with associated mediastinal adenopathy. Bronchoscopy recommended.  ? ?Pt reports LD Pletal 11/23/21. LD ASA 11/30/21. ? ?Pt will need DOS labs and eval. ? ?EKG 04/19/21: Normal sinus rhythm at rate of 66 bpm, normal axis.  Poor R wave progression, probably normal variant.  Nonspecific T abnormality. ? ?CT Chest 11/09/21: ?IMPRESSION: ?1. Large RIGHT lower lobe mass with frank pleural involvement of the ?major fissure, endobronchial invasion, satellite metastases, ?suspected local lymphangitic carcinomatosis that extend into the ?upper lobe and mediastinal adenopathy. Mass also extends along a ?long segment of the medial pleural surface in the RIGHT chest with ?signs of invasion of extrapleural fat. ?2. Findings that suggest liver disease/cirrhosis and early portal ?hypertension, correlate clinically. ?3. Cholelithiasis. ?4. Post median sternotomy for CABG. ?5. Aortic atherosclerosis. ?  ?[place] Stress Test 03/22/2019: ?1. Stress EKG is non-diagnostic, as this is pharmacological stress test. Myocardial perfusion imaging is normal. Left ventricular ejection fraction is  66% with normal wall motion. Low risk study. ?2. Compared to the study done on 12/08/2015, inferior ischemia not  present. ? ?Echocardiogram 03/16/2019: ?Left ventricle cavity is normal in size. Moderate concentric hypertrophy of the left ventricle. Normal LV systolic function with EF 55%. Abnormal septal wall motion due to post-operative coronary artery bypass graft. Doppler evidence of grade I (impaired) diastolic dysfunction, normal LAP. Calculated EF 55%. ?Right atrial cavity is mildly dilated. ?Right ventricle cavity is mildly dilated. Normal right ventricular function. ?Mild (Grade I) mitral regurgitation. ?Trace tricuspid regurgitation. ?IVC is dilated with blunted respiratory response. Estimated RA pressure 10-15 mmHg.  ?Compared to previous study in 2017, elevated right atrial pressure is new.  ?  ?Lower extremity arterial duplex 12/13/2015: No hemodynamically significant stenoses are identified in the right lower extremity arterial system. Moderate velocity increase at the left distal superficial femoral artery suggests > 50% stenosis. Diffuse mixed plaque noted both lower extremities. This exam reveals mildly decreased perfusion of the right lower extremity with ABI 0.96 and moderately decreased perfusion of the left lower extremity with ABI 0.69 noted at the post tibial artery level. When compared to ABI done on 07/04/2011, ABI was normal bilaterally. ?  ?  ? ?Karoline Caldwell, PA-C ?[facility] Stay [facility]/Anesthesiology ?Phone [phone] ?12/03/2021 12:42 PM ? ? ? ? ?  ?

## 2021-12-04 ENCOUNTER — Encounter (HOSPITAL_COMMUNITY): Payer: Self-pay | Admitting: Emergency Medicine

## 2021-12-04 ENCOUNTER — Ambulatory Visit (HOSPITAL_COMMUNITY)
Admission: RE | Admit: 2021-12-04 | Discharge: 2021-12-04 | Disposition: A | Discharge status: Home / Self Care | Payer: Medicare HMO | Attending: Emergency Medicine | Admitting: Emergency Medicine

## 2021-12-04 ENCOUNTER — Ambulatory Visit (HOSPITAL_COMMUNITY): Payer: Medicare HMO | Admitting: Physician Assistant

## 2021-12-04 ENCOUNTER — Other Ambulatory Visit: Payer: Self-pay

## 2021-12-04 ENCOUNTER — Encounter (HOSPITAL_COMMUNITY)
Admission: RE | Disposition: A | Discharge status: Home / Self Care | Payer: Self-pay | Source: Home / Self Care | Attending: Emergency Medicine

## 2021-12-04 ENCOUNTER — Ambulatory Visit (HOSPITAL_BASED_OUTPATIENT_CLINIC_OR_DEPARTMENT_OTHER): Payer: Medicare HMO | Admitting: Physician Assistant

## 2021-12-04 ENCOUNTER — Other Ambulatory Visit: Payer: Self-pay | Admitting: Emergency Medicine

## 2021-12-04 ENCOUNTER — Ambulatory Visit (HOSPITAL_COMMUNITY): Payer: Medicare HMO

## 2021-12-04 DIAGNOSIS — E119 Type 2 diabetes mellitus without complications: Secondary | ICD-10-CM | POA: Insufficient documentation

## 2021-12-04 DIAGNOSIS — I10 Essential (primary) hypertension: Secondary | ICD-10-CM

## 2021-12-04 DIAGNOSIS — Z7984 Long term (current) use of oral hypoglycemic drugs: Secondary | ICD-10-CM | POA: Insufficient documentation

## 2021-12-04 DIAGNOSIS — I252 Old myocardial infarction: Secondary | ICD-10-CM | POA: Diagnosis not present

## 2021-12-04 DIAGNOSIS — R918 Other nonspecific abnormal finding of lung field: Secondary | ICD-10-CM | POA: Diagnosis not present

## 2021-12-04 DIAGNOSIS — I2510 Atherosclerotic heart disease of native coronary artery without angina pectoris: Secondary | ICD-10-CM

## 2021-12-04 DIAGNOSIS — Z87891 Personal history of nicotine dependence: Secondary | ICD-10-CM | POA: Insufficient documentation

## 2021-12-04 DIAGNOSIS — I739 Peripheral vascular disease, unspecified: Secondary | ICD-10-CM | POA: Diagnosis not present

## 2021-12-04 DIAGNOSIS — C3431 Malignant neoplasm of lower lobe, right bronchus or lung: Secondary | ICD-10-CM | POA: Diagnosis not present

## 2021-12-04 DIAGNOSIS — E1151 Type 2 diabetes mellitus with diabetic peripheral angiopathy without gangrene: Secondary | ICD-10-CM | POA: Diagnosis not present

## 2021-12-04 HISTORY — PX: BRONCHIAL BIOPSY: SHX5109

## 2021-12-04 HISTORY — PX: VIDEO BRONCHOSCOPY: SHX5072

## 2021-12-04 HISTORY — PX: BRONCHIAL BRUSHINGS: SHX5108

## 2021-12-04 HISTORY — PX: BRONCHIAL NEEDLE ASPIRATION BIOPSY: SHX5106

## 2021-12-04 LAB — BASIC METABOLIC PANEL
Anion gap: 10 (ref 5–15)
BUN: 9 mg/dL (ref 8–23)
CO2: 23 mmol/L (ref 22–32)
Calcium: 9.5 mg/dL (ref 8.9–10.3)
Chloride: 107 mmol/L (ref 98–111)
Creatinine, Ser: 0.67 mg/dL (ref 0.61–1.24)
GFR, Estimated: >60 mL/min (ref >60)
Glucose, Bld: 120 mg/dL — ABNORMAL HIGH (ref 70–99)
Potassium: 4.2 mmol/L (ref 3.5–5.1)
Sodium: 140 mmol/L (ref 135–145)

## 2021-12-04 LAB — CBC
HCT: 41.2 % (ref 39.0–52.0)
Hemoglobin: 13.6 g/dL (ref 13.0–17.0)
MCH: 27.9 pg (ref 26.0–34.0)
MCHC: 33 g/dL (ref 30.0–36.0)
MCV: 84.4 fL (ref 80.0–100.0)
Platelets: 169 10*3/uL (ref 150–400)
RBC: 4.88 MIL/uL (ref 4.22–5.81)
RDW: 14.6 % (ref 11.5–15.5)
WBC: 7.5 10*3/uL (ref 4.0–10.5)
nRBC: 0 % (ref 0.0–0.2)

## 2021-12-04 LAB — GLUCOSE, CAPILLARY
Glucose-Capillary: 117 mg/dL — ABNORMAL HIGH (ref 70–99)
Glucose-Capillary: 100 mg/dL — ABNORMAL HIGH (ref 70–99)

## 2021-12-04 SURGERY — BRONCHOSCOPY, WITH FLUOROSCOPY
Anesthesia: General

## 2021-12-04 MED ORDER — LACTATED RINGERS IV SOLN: INTRAVENOUS | Status: DC

## 2021-12-04 MED ORDER — FENTANYL CITRATE (PF) 250 MCG/5ML IJ SOLN
INTRAMUSCULAR | Status: DC | PRN
Start: 2021-12-04 — End: 2021-12-04
  Administered 2021-12-04: 100 ug via INTRAVENOUS

## 2021-12-04 MED ORDER — ACETAMINOPHEN 160 MG/5ML PO SOLN: 325.0000 mg | ORAL | Status: DC | PRN

## 2021-12-04 MED ORDER — PHENYLEPHRINE HCL-NACL 20-0.9 MG/250ML-% IV SOLN
INTRAVENOUS | Status: DC | PRN
  Administered 2021-12-04: 35 ug/min via INTRAVENOUS

## 2021-12-04 MED ORDER — INSULIN ASPART 100 UNIT/ML IJ SOLN: 0.0000 [IU] | INTRAMUSCULAR | Status: DC | PRN

## 2021-12-04 MED ORDER — PROPOFOL 10 MG/ML IV BOLUS
INTRAVENOUS | Status: DC | PRN
  Administered 2021-12-04: 100 mg via INTRAVENOUS

## 2021-12-04 MED ORDER — SUGAMMADEX SODIUM 200 MG/2ML IV SOLN
INTRAVENOUS | Status: DC | PRN
  Administered 2021-12-04: 200 mg via INTRAVENOUS

## 2021-12-04 MED ORDER — ONDANSETRON HCL 4 MG/2ML IJ SOLN
INTRAMUSCULAR | Status: DC | PRN
  Administered 2021-12-04: 4 mg via INTRAVENOUS

## 2021-12-04 MED ORDER — ONDANSETRON HCL 4 MG/2ML IJ SOLN: 4.0000 mg | Freq: Once | INTRAMUSCULAR | Status: DC | PRN

## 2021-12-04 MED ORDER — MEPERIDINE HCL 25 MG/ML IJ SOLN: 6.2500 mg | INTRAMUSCULAR | Status: DC | PRN

## 2021-12-04 MED ORDER — CHLORHEXIDINE GLUCONATE 0.12 % MT SOLN
15.0000 mL | Freq: Once | OROMUCOSAL | Status: AC
  Administered 2021-12-04: 15 mL via OROMUCOSAL
  Filled 2021-12-04 (×2): qty 15

## 2021-12-04 MED ORDER — ROCURONIUM BROMIDE 10 MG/ML (PF) SYRINGE
PREFILLED_SYRINGE | INTRAVENOUS | Status: DC | PRN
  Administered 2021-12-04: 70 mg via INTRAVENOUS

## 2021-12-04 MED ORDER — FENTANYL CITRATE (PF) 100 MCG/2ML IJ SOLN: 25.0000 ug | INTRAMUSCULAR | Status: DC | PRN

## 2021-12-04 MED ORDER — EPHEDRINE SULFATE-NACL 50-0.9 MG/10ML-% IV SOSY
PREFILLED_SYRINGE | INTRAVENOUS | Status: DC | PRN
  Administered 2021-12-04: 10 mg via INTRAVENOUS

## 2021-12-04 MED ORDER — LIDOCAINE 2% (20 MG/ML) 5 ML SYRINGE
INTRAMUSCULAR | Status: DC | PRN
  Administered 2021-12-04: 100 mg via INTRAVENOUS

## 2021-12-04 MED ORDER — CILOSTAZOL 100 MG PO TABS: 100.0000 mg | ORAL_TABLET | Freq: Two times a day (BID) | ORAL | 1 refills | Status: DC

## 2021-12-04 MED ORDER — ASPIRIN EC 81 MG PO TBEC
81.0000 mg | DELAYED_RELEASE_TABLET | Freq: Every day | ORAL | 11 refills | Status: AC
Start: 2021-12-04 — End: ?

## 2021-12-04 MED ORDER — EPINEPHRINE 1 MG/10ML IJ SOSY
PREFILLED_SYRINGE | INTRAMUSCULAR | Status: DC | PRN
  Administered 2021-12-04: 0.3 mg

## 2021-12-04 MED ORDER — OXYCODONE HCL 5 MG/5ML PO SOLN: 5.0000 mg | Freq: Once | ORAL | Status: DC | PRN

## 2021-12-04 MED ORDER — OXYCODONE HCL 5 MG PO TABS: 5.0000 mg | ORAL_TABLET | Freq: Once | ORAL | Status: DC | PRN

## 2021-12-04 MED ORDER — METFORMIN HCL 500 MG PO TABS: 500.0000 mg | ORAL_TABLET | Freq: Two times a day (BID) | ORAL | Status: DC

## 2021-12-04 MED ORDER — DEXAMETHASONE SODIUM PHOSPHATE 10 MG/ML IJ SOLN
INTRAMUSCULAR | Status: DC | PRN
  Administered 2021-12-04: 10 mg via INTRAVENOUS

## 2021-12-04 MED ORDER — ACETAMINOPHEN 325 MG PO TABS: 325.0000 mg | ORAL_TABLET | ORAL | Status: DC | PRN

## 2021-12-04 NOTE — Op Note (Signed)
[facility] ?Cardiopulmonary ?Patient Name: Jorge Adams ?Pocedure Date: 12/04/2021 ?MRN: [id number] ?Attending MD: Collene Gobble , MD ?Date of Birth: Jul 17, 1951 ?CSN: Finalized ?Age: 71 ?Admit Type: Outpatient ?Gender: Male ?Procedure:             Bronchoscopy ?Indications:           Right lower lobe mass ?Providers:             Collene Gobble, MD, Carmie End, RN, Primitivo Gauze  ?                       [place], Technician, [employer],  ?                       Technician ?Referring MD:           ?Medicines:             General Anesthesia ?Complications:         No immediate complications ?Estimated Blood Loss:  Estimated blood loss was minimal. ?Procedure:             Pre-Anesthesia Assessment: ?                       - A History and Physical has been performed. Patient  ?                       meds and allergies have been reviewed. The risks and  ?                       benefits of the procedure and the sedation options and  ?                       risks were discussed with the patient. All questions  ?                       were answered and informed consent was obtained.  ?                       Patient identification and proposed procedure were  ?                       verified prior to the procedure by the physician in  ?                       the pre-procedure area. Mental Status Examination:  ?                       alert and oriented. Airway Examination: normal  ?                       oropharyngeal airway. Respiratory Examination: clear  ?                       to auscultation. CV Examination: normal. Prior  ?                       Anticoagulants: The patient has taken Pletal  ?                       (cilostazol), last  dose was 5 days prior to procedure.  ?                       ASA Grade Assessment: II - A patient with mild  ?                       systemic disease. After reviewing the risks and  ?                       benefits, the patient was deemed in satisfactory  ?                        condition to undergo the procedure. The anesthesia  ?                       plan was to use general anesthesia. Immediately prior  ?                       to administration of medications, the patient was  ?                       re-assessed for adequacy to receive sedatives. The  ?                       heart rate, respiratory rate, oxygen saturations,  ?                       blood pressure, adequacy of pulmonary ventilation, and  ?                       response to care were monitored throughout the  ?                       procedure. The physical status of the patient was  ?                       re-assessed after the procedure. ?                       After obtaining informed consent, the bronchoscope was  ?                       passed under direct vision. Throughout the procedure,  ?                       the patient's blood pressure, pulse, and oxygen  ?                       saturations were monitored continuously. the [id number]  ?                       )[id number]) Olympus broncoscope was introduced through  ?                       the mouth, via the endotracheal tube (the patient was  ?                       intubated for the procedure) and advanced to the  ?  tracheobronchial tree. The procedure was accomplished  ?                       without difficulty. The patient tolerated the  ?                       procedure well. ?Scope In: ?Scope Out: ?Findings: ?     The trachea is of normal caliber. The carina is sharp. The  ?     tracheobronchial tree of the left lung was examined to at least the  ?     first subsegmental level. Bronchial mucosa and anatomy in the left lung  ?     are normal; there are no endobronchial lesions, and no secretions. ?     Right Lung Abnormalities: A completely obstructing mass was found  ?     proximally, at the orifice in the right lower lobe. The mass was large  ?     and endobronchial, exophytic, fungating and raised. The lesion was not   ?     traversed. Endobronchial biopsies were performed in the right lower lobe  ?     using forceps and sent for histopathology examination. 8 were obtained.  ?     Endobronchial biopsies were performed in the right lower lobe using a 19  ?     gauge needle and sent for routine cytology. Two samples were obtained.  ?     Guided brushings were obtained in the right lower lobe with a cytology  ?     brush and sent for routine cytology. Two samples were obtained. ?Impression:            - Right lower lobe mass ?                       - The airway examination of the left lung was normal. ?                       - An endobronchial, exophytic, fungating and raised  ?                       mass was found in the right lower lobe. This lesion is  ?                       likely malignant. ?                       - Forceps endobronchial biopsy was performed. ?                       - Needle endobronchial biopsy was performed. ?                       - Brushings were obtained. ?Moderate Sedation: ?     Performed under general anesthesia ?Recommendation:        - Await cytology results. ?Procedure Code(s):     --- Professional --- ?                       [id number], Bronchoscopy, rigid or flexible, including  ?                       fluoroscopic guidance, when performed;  with bronchial  ?                       or endobronchial biopsy(s), single or multiple sites ?                       31623, Bronchoscopy, rigid or flexible, including  ?                       fluoroscopic guidance, when performed; with brushing  ?                       or protected brushings ?Diagnosis Code(s):     --- Professional --- ?                       R91.8, Other nonspecific abnormal finding of lung field ?CPT copyright 2019 [employer]. All rights reserved. ?The codes documented in this report are preliminary and upon coder review may  ?be revised to meet current compliance requirements. ?Collene Gobble, MD ?Collene Gobble, MD ?12/04/2021  2:27:50 PM ?Number of Addenda: 0 ?

## 2021-12-04 NOTE — Progress Notes (Signed)
Will make a referral for pt to see Dr Julien Nordmann at [facility] ?

## 2021-12-04 NOTE — Transfer of Care (Signed)
Immediate Anesthesia Transfer of Care Note ? ?Patient: Jorge Adams ? ?Procedure(s) Performed: VIDEO BRONCHOSCOPY WITH FLUORO ?BRONCHIAL BIOPSIES ?BRONCHIAL BRUSHINGS ?BRONCHIAL NEEDLE ASPIRATION BIOPSIES ? ?Patient Location: Endoscopy Unit ? ?Anesthesia Type:General ? ?Level of Consciousness: drowsy and patient cooperative ? ?Airway & Oxygen Therapy: Patient Spontanous Breathing ? ?Post-op Assessment: Report given to RN and Post -op Vital signs reviewed and stable ? ?Post vital signs: Reviewed and stable ? ?Last Vitals:  ?Vitals Value Taken Time  ?BP 159/72 12/04/21 1419  ?Temp 36.6 ?C 12/04/21 1419  ?Pulse 71 12/04/21 1420  ?Resp 19 12/04/21 1420  ?SpO2 95 % 12/04/21 1420  ?Vitals shown include unvalidated device data. ? ?Last Pain:  ?Vitals:  ? 12/04/21 1419  ?TempSrc: Temporal  ?PainSc: 0-No pain  ?   ? ?  ? ?Complications: No notable events documented. ?

## 2021-12-04 NOTE — Anesthesia Procedure Notes (Signed)
Procedure Name: Intubation ?Date/Time: 12/04/2021 1:39 PM ?Performed by: Lance Coon, CRNA ?Pre-anesthesia Checklist: Patient identified, Emergency Drugs available, Suction available and Patient being monitored ?Patient Re-evaluated:Patient Re-evaluated prior to induction ?Oxygen Delivery Method: Circle System Utilized ?Preoxygenation: Pre-oxygenation with 100% oxygen ?Induction Type: IV induction ?Ventilation: Mask ventilation without difficulty ?Laryngoscope Size: Sabra Heck and 3 ?Grade View: Grade I ?Tube type: Oral ?Tube size: 8.5 mm ?Number of attempts: 1 ?Airway Equipment and Method: Stylet and Oral airway ?Placement Confirmation: ETT inserted through vocal cords under direct vision, positive ETCO2 and breath sounds checked- equal and bilateral ?Secured at: 22 cm ?Tube secured with: Tape ?Dental Injury: Teeth and Oropharynx as per pre-operative assessment  ? ? ? ? ?

## 2021-12-04 NOTE — Discharge Instructions (Signed)
Flexible Bronchoscopy, Care After ?This sheet gives you information about how to care for yourself after your test. Your doctor may also give you more specific instructions. If you have problems or questions, contact your doctor. ?Follow these instructions at home: ?Eating and drinking ?Do not eat or drink anything (not even water) for 2 hours after your test, or until your numbing medicine (local anesthetic) wears off. ?When your numbness is gone and your cough and gag reflexes have come back, you may: ?Eat only soft foods. ?Slowly drink liquids. ?The day after the test, go back to your normal diet. ?Driving ?Do not drive for 24 hours if you were given a medicine to help you relax (sedative). ?Do not drive or use heavy machinery while taking prescription pain medicine. ?General instructions ? ?Take over-the-counter and prescription medicines only as told by your doctor. ?Return to your normal activities as told. Ask what activities are safe for you. ?Do not use any products that have nicotine or tobacco in them. This includes cigarettes and e-cigarettes. If you need help quitting, ask your doctor. ?Keep all follow-up visits as told by your doctor. This is important. It is very important if you had a tissue sample (biopsy) taken. ?Get help right away if: ?You have shortness of breath that gets worse. ?You get light-headed. ?You feel like you are going to pass out (faint). ?You have chest pain. ?You cough up: ?More than a little blood. ?More blood than before. ?Summary ?Do not eat or drink anything (not even water) for 2 hours after your test, or until your numbing medicine wears off. ?Do not use cigarettes. Do not use e-cigarettes. ?Get help right away if you have chest pain.. ? ?Please call our office for any questions or concerns.  [phone]. ? ?You can restart your aspirin and your Pletal on 12/05/2021. ? ?This information is not intended to replace advice given to you by your health care provider. Make sure  you discuss any questions you have with your health care provider. ?Document Released: 05/05/2009 Document Revised: 06/20/2017 Document Reviewed: 07/26/2016 ?Elsevier Patient Education ? [place]. ? ?

## 2021-12-04 NOTE — Interval H&P Note (Signed)
History and Physical Interval Note: ? ?12/04/2021 ?11:27 AM ? ?Jorge Adams  has presented today for surgery, with the diagnosis of rll mass.  The various methods of treatment have been discussed with the patient and family. After consideration of risks, benefits and other options for treatment, the patient has consented to  Procedure(s): ?VIDEO BRONCHOSCOPY WITH FLUORO (N/A) as a surgical intervention.  The patient's history has been reviewed, patient examined, no change in status, stable for surgery.  I have reviewed the patient's chart and labs.  Questions were answered to the patient's satisfaction.   ? ? ?Rose Fillers Marletta Bousquet ? ? ?

## 2021-12-05 ENCOUNTER — Telehealth: Payer: Self-pay | Admitting: Internal Medicine

## 2021-12-05 ENCOUNTER — Encounter (HOSPITAL_COMMUNITY): Payer: Self-pay | Admitting: Emergency Medicine

## 2021-12-05 ENCOUNTER — Other Ambulatory Visit: Payer: Self-pay

## 2021-12-05 DIAGNOSIS — R918 Other nonspecific abnormal finding of lung field: Secondary | ICD-10-CM

## 2021-12-05 NOTE — Telephone Encounter (Signed)
Scheduled appt per 5/16 referral. Pt is aware of appt date and time. Pt is aware to arrive 15 mins prior to appt time and to bring and updated insurance card. Pt is aware of appt location.   

## 2021-12-05 NOTE — Anesthesia Postprocedure Evaluation (Signed)
Anesthesia Post Note ? ?Patient: Jorge Adams ? ?Procedure(s) Performed: VIDEO BRONCHOSCOPY WITH FLUORO ?BRONCHIAL BIOPSIES ?BRONCHIAL BRUSHINGS ?BRONCHIAL NEEDLE ASPIRATION BIOPSIES ? ?  ? ?Patient location during evaluation: PACU ?Anesthesia Type: General ?Level of consciousness: awake and alert ?Pain management: pain level controlled ?Vital Signs Assessment: post-procedure vital signs reviewed and stable ?Respiratory status: spontaneous breathing, nonlabored ventilation, respiratory function stable and patient connected to nasal cannula oxygen ?Cardiovascular status: blood pressure returned to baseline and stable ?Postop Assessment: no apparent nausea or vomiting ?Anesthetic complications: no ? ? ?No notable events documented. ? ?Last Vitals:  ?Vitals:  ? 12/04/21 1440 12/04/21 1450  ?BP: 118/69 (!) 108/46  ?Pulse: 66 67  ?Resp: 16 18  ?Temp:    ?SpO2: 93% 92%  ?  ?Last Pain:  ?Vitals:  ? 12/04/21 1450  ?TempSrc:   ?PainSc: 0-No pain  ? ?Pain Goal:   ? ?  ?  ?  ?  ?  ?  ?  ? ?[name] ? ? ? ? ?

## 2021-12-06 LAB — CYTOLOGY - NON PAP

## 2021-12-07 ENCOUNTER — Inpatient Hospital Stay: Payer: Medicare HMO | Attending: Internal Medicine | Admitting: Internal Medicine

## 2021-12-07 ENCOUNTER — Encounter: Payer: Self-pay | Admitting: Internal Medicine

## 2021-12-07 ENCOUNTER — Inpatient Hospital Stay: Payer: Medicare HMO

## 2021-12-07 ENCOUNTER — Telehealth: Payer: Self-pay | Admitting: Emergency Medicine

## 2021-12-07 ENCOUNTER — Other Ambulatory Visit: Payer: Self-pay

## 2021-12-07 VITALS — BP 147/78 | HR 70 | Temp 97.9°F | Resp 19 | Ht 70.0 in | Wt 202.8 lb

## 2021-12-07 DIAGNOSIS — C3491 Malignant neoplasm of unspecified part of right bronchus or lung: Secondary | ICD-10-CM | POA: Insufficient documentation

## 2021-12-07 DIAGNOSIS — Z7189 Other specified counseling: Secondary | ICD-10-CM | POA: Insufficient documentation

## 2021-12-07 DIAGNOSIS — C3431 Malignant neoplasm of lower lobe, right bronchus or lung: Secondary | ICD-10-CM | POA: Diagnosis not present

## 2021-12-07 DIAGNOSIS — Z808 Family history of malignant neoplasm of other organs or systems: Secondary | ICD-10-CM | POA: Insufficient documentation

## 2021-12-07 DIAGNOSIS — Z87891 Personal history of nicotine dependence: Secondary | ICD-10-CM | POA: Insufficient documentation

## 2021-12-07 DIAGNOSIS — C3490 Malignant neoplasm of unspecified part of unspecified bronchus or lung: Secondary | ICD-10-CM

## 2021-12-07 DIAGNOSIS — R918 Other nonspecific abnormal finding of lung field: Secondary | ICD-10-CM

## 2021-12-07 DIAGNOSIS — Z5111 Encounter for antineoplastic chemotherapy: Secondary | ICD-10-CM | POA: Insufficient documentation

## 2021-12-07 LAB — CBC WITH DIFFERENTIAL (CANCER CENTER ONLY)
Abs Immature Granulocytes: 0.06 10*3/uL (ref 0.00–0.07)
Basophils Absolute: 0 10*3/uL (ref 0.0–0.1)
Basophils Relative: 1 %
Eosinophils Absolute: 0.2 10*3/uL (ref 0.0–0.5)
Eosinophils Relative: 2 %
HCT: 40.4 % (ref 39.0–52.0)
Hemoglobin: 13.8 g/dL (ref 13.0–17.0)
Immature Granulocytes: 1 %
Lymphocytes Relative: 19 %
Lymphs Abs: 1.5 10*3/uL (ref 0.7–4.0)
MCH: 28 pg (ref 26.0–34.0)
MCHC: 34.2 g/dL (ref 30.0–36.0)
MCV: 81.9 fL (ref 80.0–100.0)
Monocytes Absolute: 0.7 10*3/uL (ref 0.1–1.0)
Monocytes Relative: 9 %
Neutro Abs: 5.6 10*3/uL (ref 1.7–7.7)
Neutrophils Relative %: 68 %
Platelet Count: 194 10*3/uL (ref 150–400)
RBC: 4.93 MIL/uL (ref 4.22–5.81)
RDW: 14.8 % (ref 11.5–15.5)
WBC Count: 8.1 10*3/uL (ref 4.0–10.5)
nRBC: 0 % (ref 0.0–0.2)

## 2021-12-07 LAB — CMP (CANCER CENTER ONLY)
ALT: 12 U/L (ref 0–44)
AST: 13 U/L — ABNORMAL LOW (ref 15–41)
Albumin: 3.9 g/dL (ref 3.5–5.0)
Alkaline Phosphatase: 60 U/L (ref 38–126)
Anion gap: 5 (ref 5–15)
BUN: 13 mg/dL (ref 8–23)
CO2: 30 mmol/L (ref 22–32)
Calcium: 10 mg/dL (ref 8.9–10.3)
Chloride: 102 mmol/L (ref 98–111)
Creatinine: 0.85 mg/dL (ref 0.61–1.24)
GFR, Estimated: >60 mL/min (ref >60)
Glucose, Bld: 150 mg/dL — ABNORMAL HIGH (ref 70–99)
Potassium: 5.3 mmol/L — ABNORMAL HIGH (ref 3.5–5.1)
Sodium: 137 mmol/L (ref 135–145)
Total Bilirubin: 0.5 mg/dL (ref 0.3–1.2)
Total Protein: 7.5 g/dL (ref 6.5–8.1)

## 2021-12-07 MED ORDER — PROCHLORPERAZINE MALEATE 10 MG PO TABS: 10.0000 mg | ORAL_TABLET | Freq: Four times a day (QID) | ORAL | 0 refills | Status: DC | PRN

## 2021-12-07 NOTE — Progress Notes (Signed)

## 2021-12-07 NOTE — Progress Notes (Signed)
Thoracic Location of Tumor / Histology: Stage III Squamous cell carcinoma of the right lung   Biopsies  11/09/2021 CT Chest COMPARISON:  Chest x-ray of November 04, 2021.   FINDINGS: Cardiovascular: Calcified and noncalcified atheromatous plaque of the thoracic aorta. Post median sternotomy for CABG. No aneurysmal dilation of the aorta in the chest. Heart size normal without pericardial effusion. Central pulmonary vasculature is of normal caliber, unremarkable on venous phase.   Mediastinum/Nodes: Signs of adenopathy in the chest. Abnormal hypodense but un enlarged lymph node along the RIGHT paratracheal chain measuring 9 mm. Subcarinal lymph nodes along the RIGHT mainstem bronchus up to 10 mm with low-density suspicious for nodal involvement in this area as well. No AP window or LEFT hilar lymphadenopathy. No LEFT paratracheal adenopathy. No thoracic inlet adenopathy or axillary lymphadenopathy.   Lungs/Pleura: Large RIGHT lower lobe pulmonary mass which bulges, distorts and crosses the major fissure by way of septal thickening and nodularity. Centered mainly in the RIGHT lower lobe but showing endobronchial extension on image 85 of series 3. Mass measured in the axial plane at 7.3 x 6.0 cm on image 85/3 and in craniocaudal extent measuring 6.7 cm. Satellite nodules compatible with metastatic lesions in the RIGHT lower lobe measuring up to 10 mm, 2 adjacent lesions (image 84/3.). Lesion abuts not only the RIGHT hilum, invading RIGHT bronchus and partially encasing RIGHT lower lobe pulmonary artery but also shows long segment involvement of the pleura in the medial RIGHT chest in the azygoesophageal recess in addition to fissural involvement discussed above. Invasion of extrapleural fat is seen on image 76/2.   Upper Abdomen: Lobular hepatic contours. With fissural widening.  Also with top normal splenic size the spleen is incompletely imaged.  Cholelithiasis. Imaged portions of liver, gallbladder,  pancreas, spleen, adrenal glands and kidneys without acute process. No acute gastrointestinal findings to the extent evaluated on this chest CT. No upper abdominal lymphadenopathy. Adrenal glands are normal.   Musculoskeletal: Spinal degenerative changes. No acute or destructive bone process.   IMPRESSION: 1. 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 Mass also extends along a long segment of the medial pleural surface in the RIGHT chest with signs of invasion of extrapleural fat. 2. Findings that suggest liver disease/cirrhosis and early portal hypertension, correlate clinically. 3. Cholelithiasis. 4. Post median sternotomy for CABG. 5. Aortic atherosclerosis.   Tobacco/Marijuana/Snuff/ETOH use: Quit 2004, no drug/smokeless and yes to alcohol.  Past/Anticipated interventions by cardiothoracic surgery, if any:   12/04/2021  Dr. Lamonte Sakai Video Bronchoscopy with Fluoro  Past/Anticipated interventions by medical oncology, if any: NA  Signs/Symptoms Weight changes, if any:  No Respiratory complaints, if any: SOB, wheezes and dry  cough at this time. Hemoptysis, if any:  No Pain issues, if any:  0/10  SAFETY ISSUES: Prior radiation?  No Pacemaker/ICD?  No Possible current pregnancy? Male Is the patient on methotrexate? No  Current Complaints / other details:  Need more information on treatment options.

## 2021-12-07 NOTE — Progress Notes (Signed)
[place] [phone]   [phone]  CONSULT NOTE  REFERRING PHYSICIAN: Dr. Elsie Stain  REASON FOR CONSULTATION:  71 years old white male recently diagnosed with lung cancer.  HPI Jorge Adams is a 71 y.o. male with past medical history significant for osteoarthritis, coronary artery disease status post myocardial infarction and stent placement, diabetes mellitus, dyslipidemia, erectile dysfunction, hypertension, as well as long history of smoking.  The patient was seen by his primary care provider complaining of persistent cough.  He was treated initially with cough medication and prednisone as well as antibiotics with no improvement. Chest x-ray was performed on 11/06/2021 and it showed new large mass in the posterior right lower lobe likely due to primary bronchogenic carcinoma.  This was followed by CT scan of the chest with contrast on 11/09/2021 and that showed large right lower lobe pulmonary mass which bulges and distorts and crosses the major fissure by way of septal thickening and nodularity.  The mass centered mainly in the right lower lobe but showing endobronchial extension.  It measured 7.3 x 6.0 x 6.7 cm.  There was satellite nodules compatible with metastatic lesions in the right lower lobe measuring up to 1.0 cm, 2 adjacent lesions.  The lesion abuts not only the right hilum but invading the right bronchus and partially encasing the right lower lobe pulmonary artery and showed a long segment involvement of the pleura in the medial right chest in the azygous esophageal recess in addition to fissural involvement.  There was also invasion of the extrapleural fat.  The scan also showed right paratracheal lymph node measuring 0.9 cm, subcarinal lymph node measuring 1.0 cm.  There was also findings suggestive of liver disease/cirrhosis and early portal hypertension.  The patient was referred to Dr. Lamonte Sakai and on 12/04/2021 he underwent bronchoscopy  with biopsy of the right lower lobe lung mass.  The final pathology ([id number]) showed malignant cells consistent with squamous cell carcinoma. The tumor present within the biopsy fragments has a focal cribriform architecture suggestive of glandular differentiation. Two immunohistochemical stains were performed with adequate control.  The tumor is diffusely and strongly positive for the squamous marker p40. The tumor is negative for the pulmonary adeno marker TTF-1.  This immunohistochemical pattern supports the above diagnosis. The patient was referred to me today for evaluation and recommendation regarding treatment of his condition. When seen today the patient complains of fatigue and shortness of breath with exertion as well as wheezing and dry cough but no chest pain or hemoptysis.  He denied having any current nausea, vomiting, diarrhea or constipation.  He has no headache or visual changes.  He has been trying to lose some weight. Family history significant for mother with bone cancer, father had pneumonia, brother had COPD and heart disease. The patient is married and has 2 sons.  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  He was accompanied today by his wife Jorge Adams and his stepdaughter Jorge Adams.  The patient has a history of smoking up to 3 packs/day for around 36 years but quit in 2004.  He drinks alcohol occasionally and no history of drug abuse.  HPI  Past Medical History:  Diagnosis Date   Arthritis    ASHD (arteriosclerotic heart disease)    Atherosclerosis of native arteries of the extremities with intermittent claudication    Cataract    Coronary atherosclerosis of native coronary artery    Diabetes mellitus    NIDDM  Dyslipidemia    ED (erectile dysfunction)    Full dentures    Glaucoma associated with ocular disorder, mild stage    bilateral   Hyperlipidemia    Hypertension    under control, has been on med. > 9 yr.   Impotence of organic origin    Meniscus  tear 05/2012   right knee   Myocardial infarction [facility]) 10/2002   Stented coronary artery     Past Surgical History:  Procedure Laterality Date   ANTERIOR CERVICAL DECOMP/DISCECTOMY FUSION N/A 05/07/2018   Procedure: ANTERIOR CERVICAL DECOMPRESSION/DISCECTOMY FUSION CERVICAL FIVE - CERVICAL SIX;  Surgeon: Consuella Lose, MD;  Location: [place];  Service: Neurosurgery;  Laterality: N/A;  ANTERIOR CERVICAL DECOMPRESSION/DISCECTOMY FUSION CERVICAL FIVE - CERVICAL SIX   BRONCHIAL BIOPSY  12/04/2021   Procedure: BRONCHIAL BIOPSIES;  Surgeon: Collene Gobble, MD;  Location: [place];  Service: Cardiopulmonary;;   BRONCHIAL BRUSHINGS  12/04/2021   Procedure: BRONCHIAL BRUSHINGS;  Surgeon: Collene Gobble, MD;  Location: [place];  Service: Cardiopulmonary;;   BRONCHIAL NEEDLE ASPIRATION BIOPSY  12/04/2021   Procedure: BRONCHIAL NEEDLE ASPIRATION BIOPSIES;  Surgeon: Collene Gobble, MD;  Location: [place];  Service: Cardiopulmonary;;   CARDIAC CATHETERIZATION  10/23/2002   had 1 stent placed then-- by Dr. Melvern Banker   CARDIAC CATHETERIZATION N/A 12/21/2015   Procedure: Left Heart Cath and Coronary Angiography;  Surgeon: Adrian Prows, MD;  Location: [place] CV LAB;  Service: Cardiovascular;  Laterality: N/A;   CATARACT EXTRACTION Right 05/2017   CATARACT EXTRACTION Left 07/2017   COLONOSCOPY  10/08/2010   Dr.Patterson   CORONARY ARTERY BYPASS GRAFT N/A 01/02/2016   Procedure: CORONARY ARTERY BYPASS GRAFTING (CABG) TIMES 3 USING LEFT INTERNAL MAMMARY ARTERY AND RIGHT SAPHENOUS LEG VEIN HARVESTED ENDOSCOPICALLY;  Surgeon: Ivin Poot, MD;  Location: [place];  Service: Open Heart Surgery;  Laterality: N/A;   EYE SURGERY     INSERTION OF MESH N/A 05/06/2016   Procedure: INSERTION OF MESH;  Surgeon: Georganna Skeans, MD;  Location: [place];  Service: General;  Laterality: N/A;   KNEE ARTHROSCOPY WITH MEDIAL MENISECTOMY  06/04/2012   Procedure: KNEE ARTHROSCOPY WITH MEDIAL MENISECTOMY;  Surgeon:  Ninetta Lights, MD;  Location: [place];  Service: Orthopedics;  Laterality: Right;  RIGHT SCOPE MEDIAL MENISCECTOMY, CHONDROPLASTY, EXCISION LOOSE BODY   LOWER EXTREMITY ANGIOGRAM N/A 11/23/2013   Procedure: LOWER EXTREMITY ANGIOGRAM;  Surgeon: Laverda Page, MD;  Location: [facility] CATH LAB;  Service: Cardiovascular;  Laterality: N/A;   NECK SURGERY N/A    02/19/2018   PERIPHERAL VASCULAR CATHETERIZATION N/A 12/21/2015   Procedure: Abdominal Aortogram;  Surgeon: Adrian Prows, MD;  Location: [place] CV LAB;  Service: Cardiovascular;  Laterality: N/A;   PERIPHERAL VASCULAR CATHETERIZATION N/A 12/21/2015   Procedure: Abdominal Aortogram w/Lower Extremity;  Surgeon: Adrian Prows, MD;  Location: [place] CV LAB;  Service: Cardiovascular;  Laterality: N/A;   TEE WITHOUT CARDIOVERSION N/A 01/02/2016   Procedure: TRANSESOPHAGEAL ECHOCARDIOGRAM (TEE);  Surgeon: Ivin Poot, MD;  Location: [place];  Service: Open Heart Surgery;  Laterality: N/A;   UMBILICAL HERNIA REPAIR N/A 05/06/2016   Procedure: HERNIA REPAIR UMBILICAL ADULT;  Surgeon: Georganna Skeans, MD;  Location: [place];  Service: General;  Laterality: N/A;   VIDEO BRONCHOSCOPY N/A 12/04/2021   Procedure: VIDEO BRONCHOSCOPY WITH FLUORO;  Surgeon: Collene Gobble, MD;  Location: [employer];  Service: Cardiopulmonary;  Laterality: N/A;    Family History  Problem Relation Age of Onset  Bone cancer Mother    Pneumonia Father    Diabetes Brother    Heart disease Brother        Pacemaker   COPD Brother    Colon cancer Neg Hx    Prostate cancer Neg Hx    Esophageal cancer Neg Hx    Stomach cancer Neg Hx     Social History Social History   Tobacco Use   Smoking status: Former    Packs/day: 2.50    Years: 35.00    Pack years: 87.50    Types: Cigarettes    Quit date: 12/02/2002    Years since quitting: 19.0   Smokeless tobacco: Never  Vaping Use   Vaping Use: Never used  Substance Use Topics   Alcohol use: Yes     Comment: rare   Drug use: No    No Known Allergies  Current Outpatient Medications  Medication Sig Dispense Refill   albuterol (VENTOLIN HFA) 108 (90 Base) MCG/ACT inhaler Inhale 1-2 puffs into the lungs every 6 (six) hours as needed (for cough.  okay to fill with albuterol/proair/ventolin.). 8 g 2   amLODipine-benazepril (LOTREL) 5-20 MG capsule TAKE 1 CAPSULE BY MOUTH EVERY DAY 90 capsule 3   aspirin EC 81 MG tablet Take 1 tablet (81 mg total) by mouth daily. Okay to restart this medication on 12/05/2021. 30 tablet 11   benzonatate (TESSALON) 200 MG capsule Take 1 capsule (200 mg total) by mouth 3 (three) times daily as needed. 30 capsule 1   blood glucose meter kit and supplies Dispense based on pt and insurance preference. Use up to 3 times daily as directed. (Dx. E11.9). 1 each 12   Blood Pressure Monitoring (SPHYGMOMANOMETER) MISC Use daily to check BP.  Dx I10 1 each 0   brimonidine (ALPHAGAN P) 0.1 % SOLN Place 1 drop into both eyes 2 (two) times daily.     cilostazol (PLETAL) 100 MG tablet Take 1 tablet (100 mg total) by mouth 2 (two) times daily. Okay to restart this medication on 12/05/2021. 180 tablet 1   dorzolamide-timolol (COSOPT) 22.3-6.8 MG/ML ophthalmic solution Place 1 drop into both eyes 2 (two) times daily.     fenofibrate (TRICOR) 145 MG tablet TAKE 1 TABLET BY MOUTH EVERY DAY IN THE EVENING 90 tablet 1   HYDROcodone bit-homatropine (HYCODAN) 5-1.5 MG/5ML syrup Take 5 mLs by mouth every 8 (eight) hours as needed for cough (sedation caution.). 120 mL 0   metFORMIN (GLUCOPHAGE) 500 MG tablet Take 1 tablet (500 mg total) by mouth 2 (two) times daily with a meal.     metoprolol tartrate (LOPRESSOR) 25 MG tablet TAKE 1 TABLET BY MOUTH TWICE A DAY 180 tablet 4   Netarsudil-Latanoprost (ROCKLATAN) 0.02-0.005 % SOLN Apply 1 drop to eye at bedtime. In both eyes     nitroGLYCERIN (NITROSTAT) 0.4 MG SL tablet Place 1 tablet (0.4 mg total) under the tongue every 5 (five) minutes as  needed for chest pain. 25 tablet 5   omega-3 acid ethyl esters (LOVAZA) 1 g capsule TAKE 1 CAPSULE BY MOUTH TWICE A DAY 180 capsule 1   rosuvastatin (CRESTOR) 20 MG tablet TAKE 1 TABLET BY MOUTH EVERY DAY 90 tablet 0   No current facility-administered medications for this visit.    Review of Systems  Constitutional: positive for fatigue Eyes: negative Ears, nose, mouth, throat, and face: negative Respiratory: positive for cough, dyspnea on exertion, and wheezing Cardiovascular: negative Gastrointestinal: negative Genitourinary:negative Integument/breast: negative Hematologic/lymphatic: negative Musculoskeletal:negative Neurological: negative  Behavioral/Psych: negative Endocrine: negative Allergic/Immunologic: negative  Physical Exam  MOQ:HUTML, healthy, no distress, well nourished, and well developed SKIN: skin color, texture, turgor are normal, no rashes or significant lesions HEAD: Normocephalic, No masses, lesions, tenderness or abnormalities EYES: normal, PERRLA, Conjunctiva are pink and non-injected EARS: External ears normal, Canals clear OROPHARYNX:no exudate, no erythema, and lips, buccal mucosa, and tongue normal  NECK: supple, no adenopathy, no JVD LYMPH:  no palpable lymphadenopathy, no hepatosplenomegaly LUNGS: coarse sounds heard, decreased breath sounds, expiratory wheezes bilaterally HEART: regular rate & rhythm, no murmurs, and no gallops ABDOMEN:abdomen soft, non-tender, normal bowel sounds, and no masses or organomegaly BACK: Back symmetric, no curvature., No CVA tenderness EXTREMITIES:no joint deformities, effusion, or inflammation, no edema  NEURO: alert & oriented x 3 with fluent speech, no focal motor/sensory deficits  PERFORMANCE STATUS: ECOG 1  LABORATORY DATA: Lab Results  Component Value Date   WBC 7.5 12/04/2021   HGB 13.6 12/04/2021   HCT 41.2 12/04/2021   MCV 84.4 12/04/2021   PLT 169 12/04/2021      Chemistry      Component Value  Date/Time   NA 140 12/04/2021 1032   NA 143 04/10/2018 1157   K 4.2 12/04/2021 1032   CL 107 12/04/2021 1032   CO2 23 12/04/2021 1032   BUN 9 12/04/2021 1032   BUN 8 04/10/2018 1157   CREATININE 0.67 12/04/2021 1032   CREATININE 0.78 05/02/2017 0000   CREATININE 0.71 10/26/2012 0903      Component Value Date/Time   CALCIUM 9.5 12/04/2021 1032   ALKPHOS 45 10/29/2021 1016   AST 11 10/29/2021 1016   AST 16 05/02/2017 0000   ALT 9 10/29/2021 1016   ALT 15 05/02/2017 0000   BILITOT 0.7 10/29/2021 1016       RADIOGRAPHIC STUDIES: CT Chest W Contrast  Result Date: 11/09/2021 CLINICAL DATA:  Abnormal chest x-ray with suspicion for mass. * Tracking Code: BO * EXAM: CT CHEST WITH CONTRAST TECHNIQUE: Multidetector CT imaging of the chest was performed during intravenous contrast administration. RADIATION DOSE REDUCTION: This exam was performed according to the departmental dose-optimization program which includes automated exposure control, adjustment of the mA and/or kV according to patient size and/or use of iterative reconstruction technique. CONTRAST:  72m OMNIPAQUE IOHEXOL 300 MG/ML  SOLN COMPARISON:  Chest x-ray of November 04, 2021. FINDINGS: Cardiovascular: Calcified and noncalcified atheromatous plaque of the thoracic aorta. Post median sternotomy for CABG. No aneurysmal dilation of the aorta in the chest. Heart size normal without pericardial effusion. Central pulmonary vasculature is of normal caliber, unremarkable on venous phase. Mediastinum/Nodes: Signs of adenopathy in the chest. Abnormal hypodense but un enlarged lymph node along the RIGHT paratracheal chain measuring 9 mm. Subcarinal lymph nodes along the RIGHT mainstem bronchus up to 10 mm with low-density suspicious for nodal involvement in this area as well. No AP window or LEFT hilar lymphadenopathy. No LEFT paratracheal adenopathy. No thoracic inlet adenopathy or axillary lymphadenopathy. Lungs/Pleura: Large RIGHT lower lobe  pulmonary mass which bulges, distorts and crosses the major fissure by way of septal thickening and nodularity. Centered mainly in the RIGHT lower lobe but showing endobronchial extension on image 85 of series 3. Mass measured in the axial plane at 7.3 x 6.0 cm on image 85/3 and in craniocaudal extent measuring 6.7 cm. Satellite nodules compatible with metastatic lesions in the RIGHT lower lobe measuring up to 10 mm, 2 adjacent lesions (image 84/3.). Lesion abuts not only the RIGHT hilum, invading RIGHT bronchus  and partially encasing RIGHT lower lobe pulmonary artery but also shows long segment involvement of the pleura in the medial RIGHT chest in the azygoesophageal recess in addition to fissural involvement discussed above. Invasion of extrapleural fat is seen on image 76/2. Upper Abdomen: Lobular hepatic contours. With fissural widening. Also with top normal splenic size the spleen is incompletely imaged. Cholelithiasis. Imaged portions of liver, gallbladder, pancreas, spleen, adrenal glands and kidneys without acute process. No acute gastrointestinal findings to the extent evaluated on this chest CT. No upper abdominal lymphadenopathy. Adrenal glands are normal. Musculoskeletal: Spinal degenerative changes. No acute or destructive bone process. IMPRESSION: 1. 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 Mass also extends along a long segment of the medial pleural surface in the RIGHT chest with signs of invasion of extrapleural fat. 2. Findings that suggest liver disease/cirrhosis and early portal hypertension, correlate clinically. 3. Cholelithiasis. 4. Post median sternotomy for CABG. 5. Aortic atherosclerosis. These results will be called to the ordering clinician or representative by the Radiologist Assistant, and  communication documented in the PACS or [employer]. Aortic Atherosclerosis (ICD10-I70.0). Electronically Signed   By: Zetta Bills M.D.   On: 11/09/2021 15:54   DG C-Arm 1-60 Min-No Report  Result Date: 12/04/2021 Fluoroscopy was utilized by the requesting physician.  No radiographic interpretation.    ASSESSMENT: This is a very pleasant 71 years old white male recently diagnosed with a stage IIIb (T4, N2, Mx) non-small cell lung cancer, squamous cell carcinoma presented with large right lower lobe lung mass in addition to satellite nodules close to the mass and suspicious mediastinal lymphadenopathy diagnosed in May 2023 pending further staging work-up.   PLAN: I had a lengthy discussion with the patient and his family today about his current disease stage, prognosis and treatment options. I personally and independently reviewed the scan images and discussed the result and showed the images to the patient and his family. I recommended for the patient to complete the staging work-up by ordering a PET scan as well as MRI of the brain to rule out any other metastatic disease. I explained to the patient that if he has no evidence of metastatic disease outside the known areas, he may benefit from a course of concurrent chemoradiation with weekly carboplatin for AUC of 2 and paclitaxel 45 Mg/M2 for 6/7 weeks followed by consolidation treatment with immunotherapy if he has no evidence of disease progression after the induction phase. I discussed with the patient the adverse effect of the chemotherapy including but not limited to alopecia, myelosuppression, nausea and vomiting, peripheral neuropathy, liver or renal dysfunction. He is expected to start the first dose of this treatment on 12/25/2021. I will refer the patient to radiation oncology for evaluation and discussion of the radiotherapy option. If the staging work-up showed evidence of metastatic disease, I will discuss with the patient other  treatment options. I will also arrange for the patient to have a chemotherapy education class before the first dose of his treatment. I will call his pharmacy with prescription for Compazine 10 mg p.o. every 6 hours as needed for nausea. The patient will come back for follow-up visit in around 2 weeks with the start of the first cycle of his treatment. He was advised to call immediately if he has any other concerning symptoms in the interval.  The patient voices understanding of current  disease status and treatment options and is in agreement with the current care plan.  All questions were answered. The patient knows to call the clinic with any problems, questions or concerns. We can certainly see the patient much sooner if necessary.  Thank you so much for allowing me to participate in the care of Jorge Adams. I will continue to follow up the patient with you and assist in his care.  The total time spent in the appointment was 90 minutes.  Disclaimer: This note was dictated with voice recognition software. Similar sounding words can inadvertently be transcribed and may not be corrected upon review.   Jorge Adams Dec 07, 2021, 8:53 AM

## 2021-12-07 NOTE — Telephone Encounter (Signed)
Patient saw Dr Julien Nordmann today - is aware that his bx's showed squamous cell lung CA

## 2021-12-09 NOTE — Progress Notes (Signed)
Radiation Oncology         [phone] ________________________________  Initial Outpatient Consultation - Conducted via Telephone due to current COVID-19 concerns for limiting patient exposure  Same Day Simulation   Name: Jorge Adams MRN: [id number]  Date: 12/10/2021  DOB: [birth year]  WJ:[id number], Elveria Rising, MD  Curt Bears, MD   REFERRING PHYSICIAN: Curt Bears, MD  DIAGNOSIS: 71 y.o. gentleman with Stage T4 N2 Mx squamous cell carcinoma of the right lower lung - Stage IIIB    ICD-10-CM   1. Stage III squamous cell carcinoma of right lung ([place])  C34.91       HISTORY OF PRESENT ILLNESS: Jorge Adams is a 71 y.o. man who was seen by his primary care provider complaining of persistent cough.  He was treated initially with cough medication and prednisone as well as antibiotics with no improvement. Chest x-ray was performed on 11/06/2021 and it showed new large mass in the posterior right lower lobe likely due to primary bronchogenic carcinoma.  This was followed by CT scan of the chest with contrast on 11/09/2021 and that showed large right lower lobe pulmonary mass which bulges and distorts and crosses the major fissure by way of septal thickening and nodularity.  The mass centered mainly in the right lower lobe but showing endobronchial extension.  It measured 7.3 x 6.0 x 6.7 cm.  There was satellite nodules compatible with metastatic lesions in the right lower lobe measuring up to 1.0 cm, 2 adjacent lesions.  The lesion abuts not only the right hilum but invading the right bronchus and partially encasing the right lower lobe pulmonary artery and showed a long segment involvement of the pleura in the medial right chest in the azygous esophageal recess in addition to fissural involvement.  There was also invasion of the extrapleural fat.  The scan also showed right paratracheal lymph node measuring 0.9 cm, subcarinal lymph node measuring 1.0 cm.  There was also findings  suggestive of liver disease/cirrhosis and early portal hypertension.  The patient was referred to Dr. Lamonte Sakai and on 12/04/2021 he underwent bronchoscopy with biopsy of the right lower lobe lung mass.  The final pathology ([id number]) showed malignant cells consistent with squamous cell carcinoma.    PREVIOUS RADIATION THERAPY: No  PAST MEDICAL HISTORY:  Past Medical History:  Diagnosis Date   Arthritis    ASHD (arteriosclerotic heart disease)    Atherosclerosis of native arteries of the extremities with intermittent claudication    Cataract    Coronary atherosclerosis of native coronary artery    Diabetes mellitus    NIDDM   Dyslipidemia    ED (erectile dysfunction)    Full dentures    Glaucoma associated with ocular disorder, mild stage    bilateral   Hyperlipidemia    Hypertension    under control, has been on med. > 9 yr.   Impotence of organic origin    Meniscus tear 05/2012   right knee   Myocardial infarction [facility]) 10/2002   Stented coronary artery       PAST SURGICAL HISTORY: Past Surgical History:  Procedure Laterality Date   ANTERIOR CERVICAL DECOMP/DISCECTOMY FUSION N/A 05/07/2018   Procedure: ANTERIOR CERVICAL DECOMPRESSION/DISCECTOMY FUSION CERVICAL FIVE - CERVICAL SIX;  Surgeon: Consuella Lose, MD;  Location: [place];  Service: Neurosurgery;  Laterality: N/A;  ANTERIOR CERVICAL DECOMPRESSION/DISCECTOMY FUSION CERVICAL FIVE - CERVICAL SIX   BRONCHIAL BIOPSY  12/04/2021   Procedure: BRONCHIAL BIOPSIES;  Surgeon: Collene Gobble, MD;  Location[facility]  ENDOSCOPY;  Service: Cardiopulmonary;;   BRONCHIAL BRUSHINGS  12/04/2021   Procedure: BRONCHIAL BRUSHINGS;  Surgeon: Collene Gobble, MD;  Location: [place];  Service: Cardiopulmonary;;   BRONCHIAL NEEDLE ASPIRATION BIOPSY  12/04/2021   Procedure: BRONCHIAL NEEDLE ASPIRATION BIOPSIES;  Surgeon: Collene Gobble, MD;  Location: [place];  Service: Cardiopulmonary;;   CARDIAC CATHETERIZATION  10/23/2002   had 1 stent  placed then-- by Dr. Melvern Banker   CARDIAC CATHETERIZATION N/A 12/21/2015   Procedure: Left Heart Cath and Coronary Angiography;  Surgeon: Adrian Prows, MD;  Location: [place] CV LAB;  Service: Cardiovascular;  Laterality: N/A;   CATARACT EXTRACTION Right 05/2017   CATARACT EXTRACTION Left 07/2017   COLONOSCOPY  10/08/2010   Dr.Patterson   CORONARY ARTERY BYPASS GRAFT N/A 01/02/2016   Procedure: CORONARY ARTERY BYPASS GRAFTING (CABG) TIMES 3 USING LEFT INTERNAL MAMMARY ARTERY AND RIGHT SAPHENOUS LEG VEIN HARVESTED ENDOSCOPICALLY;  Surgeon: Ivin Poot, MD;  Location: [place];  Service: Open Heart Surgery;  Laterality: N/A;   EYE SURGERY     INSERTION OF MESH N/A 05/06/2016   Procedure: INSERTION OF MESH;  Surgeon: Georganna Skeans, MD;  Location: [place];  Service: General;  Laterality: N/A;   KNEE ARTHROSCOPY WITH MEDIAL MENISECTOMY  06/04/2012   Procedure: KNEE ARTHROSCOPY WITH MEDIAL MENISECTOMY;  Surgeon: Ninetta Lights, MD;  Location: [place];  Service: Orthopedics;  Laterality: Right;  RIGHT SCOPE MEDIAL MENISCECTOMY, CHONDROPLASTY, EXCISION LOOSE BODY   LOWER EXTREMITY ANGIOGRAM N/A 11/23/2013   Procedure: LOWER EXTREMITY ANGIOGRAM;  Surgeon: Laverda Page, MD;  Location: [facility] CATH LAB;  Service: Cardiovascular;  Laterality: N/A;   NECK SURGERY N/A    02/19/2018   PERIPHERAL VASCULAR CATHETERIZATION N/A 12/21/2015   Procedure: Abdominal Aortogram;  Surgeon: Adrian Prows, MD;  Location: [place] CV LAB;  Service: Cardiovascular;  Laterality: N/A;   PERIPHERAL VASCULAR CATHETERIZATION N/A 12/21/2015   Procedure: Abdominal Aortogram w/Lower Extremity;  Surgeon: Adrian Prows, MD;  Location: [place] CV LAB;  Service: Cardiovascular;  Laterality: N/A;   TEE WITHOUT CARDIOVERSION N/A 01/02/2016   Procedure: TRANSESOPHAGEAL ECHOCARDIOGRAM (TEE);  Surgeon: Ivin Poot, MD;  Location: [place];  Service: Open Heart Surgery;  Laterality: N/A;   UMBILICAL HERNIA REPAIR N/A  05/06/2016   Procedure: HERNIA REPAIR UMBILICAL ADULT;  Surgeon: Georganna Skeans, MD;  Location: [place];  Service: General;  Laterality: N/A;   VIDEO BRONCHOSCOPY N/A 12/04/2021   Procedure: VIDEO BRONCHOSCOPY WITH FLUORO;  Surgeon: Collene Gobble, MD;  Location: [place];  Service: Cardiopulmonary;  Laterality: N/A;    FAMILY HISTORY:  Family History  Problem Relation Age of Onset   Bone cancer Mother    Pneumonia Father    Diabetes Brother    Heart disease Brother        Pacemaker   COPD Brother    Colon cancer Neg Hx    Prostate cancer Neg Hx    Esophageal cancer Neg Hx    Stomach cancer Neg Hx     SOCIAL HISTORY:  Social History   Socioeconomic History   Marital status: Married    Spouse name: Not on file   Number of children: 2   Years of education: Not on file   Highest education level: Not on file  Occupational History   Occupation: retired  Tobacco Use   Smoking status: Former    Packs/day: 2.50    Years: 35.00    Pack years: 87.50    Types: Cigarettes  Quit date: 12/02/2002    Years since quitting: 19.0   Smokeless tobacco: Never  Vaping Use   Vaping Use: Never used  Substance and Sexual Activity   Alcohol use: Yes    Comment: rare   Drug use: No   Sexual activity: Not on file  Other Topics Concern   Not on file  Social History Narrative   Retired Librarian, 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    [place], [employer], [employer]   Social Determinants of Health   Financial Resource Strain: Low Risk    Difficulty of Paying Living Expenses: Not hard at all  Food Insecurity: No Food Insecurity   Worried About Charity fundraiser in the Last Year: Never true   Arboriculturist in the Last Year: Never true  Transportation Needs: No Transportation Needs   Lack of Transportation (Medical): No   Lack of Transportation (Non-Medical): No  Physical Activity: Inactive   Days  of Exercise per Week: 0 days   Minutes of Exercise per Session: 0 min  Stress: No Stress Concern Present   Feeling of Stress : Not at all  Social Connections: Not on file  Intimate Partner Violence: Not At Risk   Fear of Current or Ex-Partner: No   Emotionally Abused: No   Physically Abused: No   Sexually Abused: No    ALLERGIES: Patient has no known allergies.  MEDICATIONS:  Current Outpatient Medications  Medication Sig Dispense Refill   albuterol (VENTOLIN HFA) 108 (90 Base) MCG/ACT inhaler Inhale 1-2 puffs into the lungs every 6 (six) hours as needed (for cough.  okay to fill with albuterol/proair/ventolin.). 8 g 2   amLODipine-benazepril (LOTREL) 5-20 MG capsule TAKE 1 CAPSULE BY MOUTH EVERY DAY 90 capsule 3   aspirin EC 81 MG tablet Take 1 tablet (81 mg total) by mouth daily. Okay to restart this medication on 12/05/2021. 30 tablet 11   benzonatate (TESSALON) 200 MG capsule Take 1 capsule (200 mg total) by mouth 3 (three) times daily as needed. 30 capsule 1   blood glucose meter kit and supplies Dispense based on pt and insurance preference. Use up to 3 times daily as directed. (Dx. E11.9). 1 each 12   Blood Pressure Monitoring (SPHYGMOMANOMETER) MISC Use daily to check BP.  Dx I10 1 each 0   brimonidine (ALPHAGAN P) 0.1 % SOLN Place 1 drop into both eyes 2 (two) times daily.     cilostazol (PLETAL) 100 MG tablet Take 1 tablet (100 mg total) by mouth 2 (two) times daily. Okay to restart this medication on 12/05/2021. 180 tablet 1   dorzolamide-timolol (COSOPT) 22.3-6.8 MG/ML ophthalmic solution Place 1 drop into both eyes 2 (two) times daily.     fenofibrate (TRICOR) 145 MG tablet TAKE 1 TABLET BY MOUTH EVERY DAY IN THE EVENING 90 tablet 1   HYDROcodone bit-homatropine (HYCODAN) 5-1.5 MG/5ML syrup Take 5 mLs by mouth every 8 (eight) hours as needed for cough (sedation caution.). 120 mL 0   metFORMIN (GLUCOPHAGE) 500 MG tablet Take 1 tablet (500 mg total) by mouth 2 (two) times daily  with a meal.     metoprolol tartrate (LOPRESSOR) 25 MG tablet TAKE 1 TABLET BY MOUTH TWICE A DAY 180 tablet 4   Netarsudil-Latanoprost (ROCKLATAN) 0.02-0.005 % SOLN Apply 1 drop to eye at bedtime. In both eyes     nitroGLYCERIN (NITROSTAT) 0.4 MG SL  tablet Place 1 tablet (0.4 mg total) under the tongue every 5 (five) minutes as needed for chest pain. 25 tablet 5   omega-3 acid ethyl esters (LOVAZA) 1 g capsule TAKE 1 CAPSULE BY MOUTH TWICE A DAY 180 capsule 1   prochlorperazine (COMPAZINE) 10 MG tablet Take 1 tablet (10 mg total) by mouth every 6 (six) hours as needed for nausea or vomiting. 30 tablet 0   rosuvastatin (CRESTOR) 20 MG tablet TAKE 1 TABLET BY MOUTH EVERY DAY 90 tablet 0   No current facility-administered medications for this visit.    REVIEW OF SYSTEMS:  On review of systems, the patient reports that he is doing well overall. He denies any chest pain, shortness of breath, cough, fevers, chills, night sweats, unintended weight changes. He denies any bowel disturbances, and denies abdominal pain, nausea or vomiting. He denies any new musculoskeletal or joint aches or pains. A complete review of systems is obtained and is otherwise negative.    PHYSICAL EXAM:  Wt Readings from Last 3 Encounters:  12/07/21 202 lb 12.8 oz (92 kg)  12/04/21 198 lb (89.8 kg)  11/27/21 200 lb (90.7 kg)   Temp Readings from Last 3 Encounters:  12/07/21 97.9 F (36.6 C) (Axillary)  12/04/21 97.9 F (36.6 C) (Temporal)  11/27/21 98.3 F (36.8 C) (Oral)   BP Readings from Last 3 Encounters:  12/07/21 (!) 147/78  12/04/21 (!) 108/46  11/27/21 130/76   Pulse Readings from Last 3 Encounters:  12/07/21 70  12/04/21 67  11/27/21 80    /10  In general this is a well appearing male in no acute distress. He's alert and oriented x4 and appropriate throughout the examination. Cardiopulmonary assessment is negative for acute distress, and he exhibits normal effort.     KPS = 90  100 - Normal; no  complaints; no evidence of disease. 90   - Able to carry on normal activity; minor signs or symptoms of disease. 80   - Normal activity with effort; some signs or symptoms of disease. 33   - Cares for self; unable to carry on normal activity or to do active work. 60   - Requires occasional assistance, but is able to care for most of his personal needs. 50   - Requires considerable assistance and frequent medical care. 54   - Disabled; requires special care and assistance. 64   - Severely disabled; hospital admission is indicated although death not imminent. 62   - Very sick; hospital admission necessary; active supportive treatment necessary. 10   - Moribund; fatal processes progressing rapidly. 0     - Dead  Karnofsky DA, Abelmann Osino, Craver LS and Burchenal [facility] [phone]) The use of the nitrogen mustards in the palliative treatment of carcinoma: with particular reference to bronchogenic carcinoma Cancer 1 634-56  LABORATORY DATA:  Lab Results  Component Value Date   WBC 8.1 12/07/2021   HGB 13.8 12/07/2021   HCT 40.4 12/07/2021   MCV 81.9 12/07/2021   PLT 194 12/07/2021   Lab Results  Component Value Date   NA 137 12/07/2021   K 5.3 (H) 12/07/2021   CL 102 12/07/2021   CO2 30 12/07/2021   Lab Results  Component Value Date   ALT 12 12/07/2021   AST 13 (L) 12/07/2021   ALKPHOS 60 12/07/2021   BILITOT 0.5 12/07/2021     RADIOGRAPHY: DG C-Arm 1-60 Min-No Report  Result Date: 12/04/2021 Fluoroscopy was utilized by the requesting physician.  No radiographic interpretation.  IMPRESSION/PLAN: 1. 71 y.o. gentleman with Stage T4 N2 Mx squamous cell carcinoma of the right lower lung - Stage IIIB Today, I talked to the patient and family about the findings and work-up thus far.  We discussed the natural history of locally advanced non-small cell lung cancer and general treatment, highlighting the role of radiotherapy in the management.  We discussed the available radiation  techniques, and focused on the details of logistics and delivery.  We reviewed the anticipated acute and late sequelae associated with radiation in this setting.  The patient was encouraged to ask questions that I answered to the best of my ability.  I filled out a patient counseling form during our discussion including treatment diagrams.  We retained a copy for our records.  The patient would like to proceed with radiation and will be scheduled for CT simulation.  At the conclusion of our conversation, the patient is interested in moving forward with CT simulation later today for chest chemoradiation with a plan for 66 Gy in 33 fractions if Stage III and a shorter course for palliation if stage IV.  We personally spent 60 minutes in this encounter including chart review, reviewing radiological studies, meeting face-to-face with the patient, entering orders and completing documentation.  This visit was conducted via telephone to spare the patient unnecessary potential exposure in the healthcare setting during the current COVID-19 pandemic.   Given current concerns for patient exposure during the COVID-19 pandemic, this encounter was conducted via telephone. The patient was notified in advance and was offered a [place] meeting to allow for face to face communication but unfortunately reported that he/she did not have the appropriate resources/technology to support such a visit and instead preferred to proceed with telephone consult. The patient has given verbal consent for this type of encounter. The attendants for this meeting include Tyler Pita MD, patient, Mr. Loiseau and his wife. During the encounter, Tyler Pita MD was located at [facility] Radiation Oncology Department.  Patient and his wife were located at home.      Tyler Pita, MD  [facility]  Radiation Oncology Direct Dial: [phone]  Fax: [phone] [email or web address]  Skype  LinkedIn

## 2021-12-09 NOTE — Progress Notes (Signed)
  Radiation Oncology         [phone] ________________________________  Name: JOHNMARK GEIGER MRN: [id number]  Date: 12/10/2021  DOB: Jun 15, 1951  SIMULATION AND TREATMENT PLANNING NOTE    ICD-10-CM   1. Stage III squamous cell carcinoma of right lung ([facility])  C34.91       DIAGNOSIS:  71 y.o. gentleman with Stage T4 N2 Mx squamous cell carcinoma of the right lower lung - Stage IIIB  NARRATIVE:  The patient was brought to the [facility].  Identity was confirmed.  All relevant records and images related to the planned course of therapy were reviewed.  The patient freely provided informed written consent to proceed with treatment after reviewing the details related to the planned course of therapy. The consent form was witnessed and verified by the simulation staff.  Then, the patient was set-up in a stable reproducible  supine position for radiation therapy.  CT images were obtained.  Surface markings were placed.  The CT images were loaded into the planning software.  Then the target and avoidance structures were contoured.  Treatment planning then occurred.  The radiation prescription was entered and confirmed.  Then, I designed and supervised the construction of a total of 6 medically necessary complex treatment devices, including a BodyFix immobilization mold custom fitted to the patient along with 5 multileaf collimators conformally shaped radiation around the treatment target while shielding critical structures such as the heart and spinal cord maximally.  I have requested : 3D Simulation  I have requested a DVH of the following structures: Left lung, right lung, spinal cord, heart, esophagus, and target.  I have ordered:Nutrition Consult  SPECIAL TREATMENT PROCEDURE:  The planned course of therapy using radiation constitutes a special treatment procedure. Special care is required in the management of this patient for the following reasons.  The patient will be receiving  concurrent chemotherapy requiring careful monitoring for increased toxicities of treatment including periodic laboratory values.  The special nature of the planned course of radiotherapy will require increased physician supervision and oversight to ensure patient's safety with optimal treatment outcomes.  PLAN:  The patient will receive 66 Gy in 33 fractions.  ________________________________  Sheral Apley Tammi Klippel, M.D.

## 2021-12-10 ENCOUNTER — Ambulatory Visit
Admission: RE | Admit: 2021-12-10 | Discharge: 2021-12-10 | Disposition: A | Discharge status: Home / Self Care | Payer: Medicare HMO | Source: Ambulatory Visit | Attending: Radiation Oncology | Admitting: Radiation Oncology

## 2021-12-10 ENCOUNTER — Encounter: Payer: Self-pay | Admitting: *Deleted

## 2021-12-10 ENCOUNTER — Other Ambulatory Visit: Payer: Self-pay

## 2021-12-10 VITALS — Ht 70.0 in | Wt 202.0 lb

## 2021-12-10 DIAGNOSIS — C3432 Malignant neoplasm of lower lobe, left bronchus or lung: Secondary | ICD-10-CM | POA: Insufficient documentation

## 2021-12-10 DIAGNOSIS — C3431 Malignant neoplasm of lower lobe, right bronchus or lung: Secondary | ICD-10-CM | POA: Diagnosis not present

## 2021-12-10 DIAGNOSIS — C3491 Malignant neoplasm of unspecified part of right bronchus or lung: Secondary | ICD-10-CM

## 2021-12-10 DIAGNOSIS — Z87891 Personal history of nicotine dependence: Secondary | ICD-10-CM | POA: Diagnosis not present

## 2021-12-10 NOTE — Progress Notes (Signed)
Oncology Nurse Navigator Documentation     12/10/2021    9:00 AM  Oncology Nurse Navigator Flowsheets  Abnormal Finding Date 11/06/2021  Confirmed Diagnosis Date 12/04/2021  Navigator Follow Up Date: 12/17/2021  Navigator Follow Up Reason: Appointment Review;Scan Review  Navigator Location [facility]  Referral Date to RadOnc/MedOnc 12/10/2021  Navigator Encounter Type Telephone  Telephone Outgoing Call  Treatment Phase Pre-Tx/Tx Discussion  Barriers/Navigation Needs Coordination of Care;Education/I received a message from Dr. Tammi Klippel regarding patient needing scans scheduled. I checked and scans are authorized. I called radiology and was able to schedule scans this week.  I called patient with an update on appt time, place, and pre-procedure instructions.  I then notified Dr. Tammi Klippel.   Education Other  Interventions Coordination of Care;Education  Acuity Level 3-Moderate Needs (3-4 Barriers Identified)  Coordination of Care Appts  Education Method Verbal  Time Spent with Patient 45

## 2021-12-13 ENCOUNTER — Ambulatory Visit (HOSPITAL_COMMUNITY)
Admission: RE | Admit: 2021-12-13 | Discharge: 2021-12-13 | Disposition: A | Discharge status: Home / Self Care | Payer: Medicare HMO | Source: Ambulatory Visit | Attending: Internal Medicine | Admitting: Internal Medicine

## 2021-12-13 DIAGNOSIS — R918 Other nonspecific abnormal finding of lung field: Secondary | ICD-10-CM

## 2021-12-13 DIAGNOSIS — C3490 Malignant neoplasm of unspecified part of unspecified bronchus or lung: Secondary | ICD-10-CM | POA: Diagnosis not present

## 2021-12-13 LAB — GLUCOSE, CAPILLARY: Glucose-Capillary: 148 mg/dL — ABNORMAL HIGH (ref 70–99)

## 2021-12-13 MED ORDER — FLUDEOXYGLUCOSE F - 18 (FDG) INJECTION
10.1000 | Freq: Once | INTRAVENOUS | Status: AC
  Administered 2021-12-13: 10.1 via INTRAVENOUS

## 2021-12-14 ENCOUNTER — Other Ambulatory Visit: Payer: Self-pay

## 2021-12-15 ENCOUNTER — Ambulatory Visit (HOSPITAL_COMMUNITY)
Admission: RE | Admit: 2021-12-15 | Discharge: 2021-12-15 | Disposition: A | Discharge status: Home / Self Care | Payer: Medicare HMO | Source: Ambulatory Visit | Attending: Internal Medicine | Admitting: Internal Medicine

## 2021-12-15 DIAGNOSIS — C3490 Malignant neoplasm of unspecified part of unspecified bronchus or lung: Secondary | ICD-10-CM | POA: Diagnosis not present

## 2021-12-15 MED ORDER — GADOBUTROL 1 MMOL/ML IV SOLN
9.0000 mL | Freq: Once | INTRAVENOUS | Status: AC | PRN
  Administered 2021-12-15: 9 mL via INTRAVENOUS

## 2021-12-18 ENCOUNTER — Telehealth: Payer: Self-pay | Admitting: Medical Oncology

## 2021-12-18 NOTE — Telephone Encounter (Signed)
PET scan results- next appt June 5th  MRI brain - I told him the   "IMPRESSION: No evidence of intracranial metastatic disease."   Pt education class- asking if wife can attend. I told him yes.

## 2021-12-19 ENCOUNTER — Other Ambulatory Visit: Payer: Self-pay

## 2021-12-19 ENCOUNTER — Inpatient Hospital Stay: Payer: Medicare HMO

## 2021-12-19 ENCOUNTER — Telehealth: Payer: Self-pay

## 2021-12-19 NOTE — Telephone Encounter (Signed)
-----   Message from Ardeen Garland, RN sent at 12/19/2021  9:11 AM EDT ----- Regarding: CT results "Curt Bears, MD to [place] [facility]    10:00 PM Similar results to the CT scan. No spread outside the chest. We will discuss in more detail at his next visit. "

## 2021-12-19 NOTE — Telephone Encounter (Signed)
Told Jorge Adams the results of the Pet scan below by Dr. Julien Nordmann.  Told him no treatment plan change per Dr. Julien Nordmann. Taxol and Carboplatin weekly with radiation to begin 12-25-21. Pt verbalized understanding.

## 2021-12-24 DIAGNOSIS — C3431 Malignant neoplasm of lower lobe, right bronchus or lung: Secondary | ICD-10-CM | POA: Insufficient documentation

## 2021-12-24 DIAGNOSIS — C3491 Malignant neoplasm of unspecified part of right bronchus or lung: Secondary | ICD-10-CM | POA: Insufficient documentation

## 2021-12-24 DIAGNOSIS — Z79899 Other long term (current) drug therapy: Secondary | ICD-10-CM | POA: Insufficient documentation

## 2021-12-24 DIAGNOSIS — Z5111 Encounter for antineoplastic chemotherapy: Secondary | ICD-10-CM | POA: Insufficient documentation

## 2021-12-24 DIAGNOSIS — Z87891 Personal history of nicotine dependence: Secondary | ICD-10-CM | POA: Diagnosis not present

## 2021-12-24 DIAGNOSIS — E860 Dehydration: Secondary | ICD-10-CM | POA: Insufficient documentation

## 2021-12-24 DIAGNOSIS — R052 Subacute cough: Secondary | ICD-10-CM | POA: Diagnosis not present

## 2021-12-24 DIAGNOSIS — R197 Diarrhea, unspecified: Secondary | ICD-10-CM | POA: Diagnosis not present

## 2021-12-24 MED FILL — Dexamethasone Sodium Phosphate Inj 100 MG/10ML: INTRAMUSCULAR | Qty: 1 | Status: AC

## 2021-12-25 ENCOUNTER — Other Ambulatory Visit: Payer: Self-pay

## 2021-12-25 ENCOUNTER — Ambulatory Visit
Admission: RE | Admit: 2021-12-25 | Discharge: 2021-12-25 | Disposition: A | Discharge status: Home / Self Care | Payer: Medicare HMO | Source: Ambulatory Visit | Attending: Radiation Oncology | Admitting: Radiation Oncology

## 2021-12-25 ENCOUNTER — Encounter: Payer: Self-pay | Admitting: Internal Medicine

## 2021-12-25 ENCOUNTER — Inpatient Hospital Stay: Payer: Medicare HMO

## 2021-12-25 ENCOUNTER — Inpatient Hospital Stay: Payer: Medicare HMO | Attending: Internal Medicine | Admitting: Internal Medicine

## 2021-12-25 VITALS — BP 139/71 | HR 86 | Temp 98.8°F | Resp 17 | Ht 70.0 in | Wt 201.9 lb

## 2021-12-25 VITALS — BP 133/68 | HR 82 | Temp 98.8°F | Resp 18

## 2021-12-25 DIAGNOSIS — E860 Dehydration: Secondary | ICD-10-CM | POA: Diagnosis not present

## 2021-12-25 DIAGNOSIS — Z5111 Encounter for antineoplastic chemotherapy: Secondary | ICD-10-CM

## 2021-12-25 DIAGNOSIS — C3491 Malignant neoplasm of unspecified part of right bronchus or lung: Secondary | ICD-10-CM | POA: Diagnosis not present

## 2021-12-25 DIAGNOSIS — Z79899 Other long term (current) drug therapy: Secondary | ICD-10-CM | POA: Diagnosis not present

## 2021-12-25 DIAGNOSIS — R197 Diarrhea, unspecified: Secondary | ICD-10-CM | POA: Diagnosis not present

## 2021-12-25 DIAGNOSIS — C3431 Malignant neoplasm of lower lobe, right bronchus or lung: Secondary | ICD-10-CM | POA: Diagnosis not present

## 2021-12-25 DIAGNOSIS — R052 Subacute cough: Secondary | ICD-10-CM | POA: Diagnosis not present

## 2021-12-25 DIAGNOSIS — Z510 Encounter for antineoplastic radiation therapy: Secondary | ICD-10-CM | POA: Diagnosis not present

## 2021-12-25 DIAGNOSIS — Z87891 Personal history of nicotine dependence: Secondary | ICD-10-CM | POA: Diagnosis not present

## 2021-12-25 LAB — CBC WITH DIFFERENTIAL (CANCER CENTER ONLY)
Abs Immature Granulocytes: 0.03 10*3/uL (ref 0.00–0.07)
Basophils Absolute: 0 10*3/uL (ref 0.0–0.1)
Basophils Relative: 0 %
Eosinophils Absolute: 0.1 10*3/uL (ref 0.0–0.5)
Eosinophils Relative: 1 %
HCT: 37.6 % — ABNORMAL LOW (ref 39.0–52.0)
Hemoglobin: 12.9 g/dL — ABNORMAL LOW (ref 13.0–17.0)
Immature Granulocytes: 0 %
Lymphocytes Relative: 11 %
Lymphs Abs: 1.1 10*3/uL (ref 0.7–4.0)
MCH: 28 pg (ref 26.0–34.0)
MCHC: 34.3 g/dL (ref 30.0–36.0)
MCV: 81.7 fL (ref 80.0–100.0)
Monocytes Absolute: 0.9 10*3/uL (ref 0.1–1.0)
Monocytes Relative: 9 %
Neutro Abs: 8.3 10*3/uL — ABNORMAL HIGH (ref 1.7–7.7)
Neutrophils Relative %: 79 %
Platelet Count: 149 10*3/uL — ABNORMAL LOW (ref 150–400)
RBC: 4.6 MIL/uL (ref 4.22–5.81)
RDW: 15.4 % (ref 11.5–15.5)
WBC Count: 10.6 10*3/uL — ABNORMAL HIGH (ref 4.0–10.5)
nRBC: 0 % (ref 0.0–0.2)

## 2021-12-25 LAB — RAD ONC ARIA SESSION SUMMARY
Course Elapsed Days: 0
Plan Fractions Treated to Date: 1
Plan Prescribed Dose Per Fraction: 2 Gy
Plan Total Fractions Prescribed: 33
Plan Total Prescribed Dose: 66 Gy
Reference Point Dosage Given to Date: 2 Gy
Reference Point Session Dosage Given: 2 Gy
Session Number: 1

## 2021-12-25 LAB — CMP (CANCER CENTER ONLY)
ALT: 9 U/L (ref 0–44)
AST: 11 U/L — ABNORMAL LOW (ref 15–41)
Albumin: 3.9 g/dL (ref 3.5–5.0)
Alkaline Phosphatase: 51 U/L (ref 38–126)
Anion gap: 9 (ref 5–15)
BUN: 9 mg/dL (ref 8–23)
CO2: 24 mmol/L (ref 22–32)
Calcium: 9.7 mg/dL (ref 8.9–10.3)
Chloride: 102 mmol/L (ref 98–111)
Creatinine: 0.79 mg/dL (ref 0.61–1.24)
GFR, Estimated: >60 mL/min (ref >60)
Glucose, Bld: 213 mg/dL — ABNORMAL HIGH (ref 70–99)
Potassium: 3.7 mmol/L (ref 3.5–5.1)
Sodium: 135 mmol/L (ref 135–145)
Total Bilirubin: 0.9 mg/dL (ref 0.3–1.2)
Total Protein: 7.1 g/dL (ref 6.5–8.1)

## 2021-12-25 MED ORDER — SODIUM CHLORIDE 0.9 % IV SOLN
223.8000 mg | Freq: Once | INTRAVENOUS | Status: AC
  Administered 2021-12-25: 220 mg via INTRAVENOUS
  Filled 2021-12-25: qty 22

## 2021-12-25 MED ORDER — SODIUM CHLORIDE 0.9 % IV SOLN: Freq: Once | INTRAVENOUS | Status: AC

## 2021-12-25 MED ORDER — DIPHENHYDRAMINE HCL 50 MG/ML IJ SOLN
50.0000 mg | Freq: Once | INTRAMUSCULAR | Status: AC
  Administered 2021-12-25: 50 mg via INTRAVENOUS
  Filled 2021-12-25: qty 1

## 2021-12-25 MED ORDER — PALONOSETRON HCL INJECTION 0.25 MG/5ML
0.2500 mg | Freq: Once | INTRAVENOUS | Status: AC
  Administered 2021-12-25: 0.25 mg via INTRAVENOUS
  Filled 2021-12-25: qty 5

## 2021-12-25 MED ORDER — SODIUM CHLORIDE 0.9 % IV SOLN
10.0000 mg | Freq: Once | INTRAVENOUS | Status: AC
  Administered 2021-12-25: 10 mg via INTRAVENOUS
  Filled 2021-12-25: qty 10

## 2021-12-25 MED ORDER — SODIUM CHLORIDE 0.9 % IV SOLN
45.0000 mg/m2 | Freq: Once | INTRAVENOUS | Status: AC
  Administered 2021-12-25: 96 mg via INTRAVENOUS
  Filled 2021-12-25: qty 16

## 2021-12-25 MED ORDER — FAMOTIDINE IN NACL 20-0.9 MG/50ML-% IV SOLN
20.0000 mg | Freq: Once | INTRAVENOUS | Status: AC
  Administered 2021-12-25: 20 mg via INTRAVENOUS
  Filled 2021-12-25: qty 50

## 2021-12-25 NOTE — Progress Notes (Signed)
[place] [phone]   [phone]  OFFICE PROGRESS NOTE  [name], MD [place] [address]  DIAGNOSIS:  Stage IIIb (T4, N0/N2, M0) non-small cell lung cancer, squamous cell carcinoma presented with large right lower lobe lung mass in addition to satellite nodules close to the mass and suspicious mediastinal lymphadenopathy diagnosed in May 2023  PD-L1 expression 1%  PRIOR THERAPY: None  CURRENT THERAPY: A course of concurrent chemoradiation with weekly carboplatin for AUC of 2 and paclitaxel 45 Mg/M2.  First dose of chemotherapy starts 12/25/2021  INTERVAL HISTORY: ERHARD SENSKE 71 y.o. male returns to the clinic today for follow-up visit accompanied by his wife.  The patient is feeling fine today with no concerning complaints except for the persistent cough.  He is currently on Hycodan for cough suppression.  He denied having any chest pain but has shortness of breath with exertion with no hemoptysis.  He denied having any fever or chills.  He has no nausea, vomiting, diarrhea or constipation.  He has no headache or visual changes.  He had staging work-up with PET scan and MRI of the brain performed recently and he is here for evaluation and discussion of his imaging studies before starting the first dose of his treatment.  MEDICAL HISTORY: Past Medical History:  Diagnosis Date   Arthritis    ASHD (arteriosclerotic heart disease)    Atherosclerosis of native arteries of the extremities with intermittent claudication    Cataract    Coronary atherosclerosis of native coronary artery    Diabetes mellitus    NIDDM   Dyslipidemia    ED (erectile dysfunction)    Full dentures    Glaucoma associated with ocular disorder, mild stage    bilateral   Hyperlipidemia    Hypertension    under control, has been on med. > 9 yr.   Impotence of organic origin    Meniscus tear 05/2012   right knee   Myocardial  infarction [facility]) 10/2002   Stented coronary artery     ALLERGIES:  has No Known Allergies.  MEDICATIONS:  Current Outpatient Medications  Medication Sig Dispense Refill   albuterol (VENTOLIN HFA) 108 (90 Base) MCG/ACT inhaler Inhale 1-2 puffs into the lungs every 6 (six) hours as needed (for cough.  okay to fill with albuterol/proair/ventolin.). 8 g 2   amLODipine-benazepril (LOTREL) 5-20 MG capsule TAKE 1 CAPSULE BY MOUTH EVERY DAY 90 capsule 3   aspirin EC 81 MG tablet Take 1 tablet (81 mg total) by mouth daily. Okay to restart this medication on 12/05/2021. 30 tablet 11   benzonatate (TESSALON) 200 MG capsule Take 1 capsule (200 mg total) by mouth 3 (three) times daily as needed. 30 capsule 1   blood glucose meter kit and supplies Dispense based on pt and insurance preference. Use up to 3 times daily as directed. (Dx. E11.9). 1 each 12   Blood Pressure Monitoring (SPHYGMOMANOMETER) MISC Use daily to check BP.  Dx I10 1 each 0   brimonidine (ALPHAGAN P) 0.1 % SOLN Place 1 drop into both eyes 2 (two) times daily.     cilostazol (PLETAL) 100 MG tablet Take 1 tablet (100 mg total) by mouth 2 (two) times daily. Okay to restart this medication on 12/05/2021. 180 tablet 1   dorzolamide-timolol (COSOPT) 22.3-6.8 MG/ML ophthalmic solution Place 1 drop into both eyes 2 (two) times daily.     fenofibrate (TRICOR) 145 MG tablet  TAKE 1 TABLET BY MOUTH EVERY DAY IN THE EVENING 90 tablet 1   HYDROcodone bit-homatropine (HYCODAN) 5-1.5 MG/5ML syrup Take 5 mLs by mouth every 8 (eight) hours as needed for cough (sedation caution.). 120 mL 0   metFORMIN (GLUCOPHAGE) 500 MG tablet Take 1 tablet (500 mg total) by mouth 2 (two) times daily with a meal.     metoprolol tartrate (LOPRESSOR) 25 MG tablet TAKE 1 TABLET BY MOUTH TWICE A DAY 180 tablet 4   Netarsudil-Latanoprost (ROCKLATAN) 0.02-0.005 % SOLN Apply 1 drop to eye at bedtime. In both eyes     nitroGLYCERIN (NITROSTAT) 0.4 MG SL tablet Place 1 tablet (0.4 mg  total) under the tongue every 5 (five) minutes as needed for chest pain. 25 tablet 5   omega-3 acid ethyl esters (LOVAZA) 1 g capsule TAKE 1 CAPSULE BY MOUTH TWICE A DAY 180 capsule 1   prochlorperazine (COMPAZINE) 10 MG tablet Take 1 tablet (10 mg total) by mouth every 6 (six) hours as needed for nausea or vomiting. 30 tablet 0   rosuvastatin (CRESTOR) 20 MG tablet TAKE 1 TABLET BY MOUTH EVERY DAY 90 tablet 0   No current facility-administered medications for this visit.    SURGICAL HISTORY:  Past Surgical History:  Procedure Laterality Date   ANTERIOR CERVICAL DECOMP/DISCECTOMY FUSION N/A 05/07/2018   Procedure: ANTERIOR CERVICAL DECOMPRESSION/DISCECTOMY FUSION CERVICAL FIVE - CERVICAL SIX;  Surgeon: Consuella Lose, MD;  Location: [facility] [facility];  Service: Neurosurgery;  Laterality: N/A;  ANTERIOR CERVICAL DECOMPRESSION/DISCECTOMY FUSION CERVICAL FIVE - CERVICAL SIX   BRONCHIAL BIOPSY  12/04/2021   Procedure: BRONCHIAL BIOPSIES;  Surgeon: Collene Gobble, MD;  Location: [facility];  Service: Cardiopulmonary;;   BRONCHIAL BRUSHINGS  12/04/2021   Procedure: BRONCHIAL BRUSHINGS;  Surgeon: Collene Gobble, MD;  Location: [facility];  Service: Cardiopulmonary;;   BRONCHIAL NEEDLE ASPIRATION BIOPSY  12/04/2021   Procedure: BRONCHIAL NEEDLE ASPIRATION BIOPSIES;  Surgeon: Collene Gobble, MD;  Location: [place];  Service: Cardiopulmonary;;   CARDIAC CATHETERIZATION  10/23/2002   had 1 stent placed then-- by Dr. Melvern Banker   CARDIAC CATHETERIZATION N/A 12/21/2015   Procedure: Left Heart Cath and Coronary Angiography;  Surgeon: Adrian Prows, MD;  Location: [facility] CV [facility];  Service: Cardiovascular;  Laterality: N/A;   CATARACT EXTRACTION Right 05/2017   CATARACT EXTRACTION Left 07/2017   COLONOSCOPY  10/08/2010   Dr.Patterson   CORONARY ARTERY BYPASS GRAFT N/A 01/02/2016   Procedure: CORONARY ARTERY BYPASS GRAFTING (CABG) TIMES 3 USING LEFT INTERNAL MAMMARY ARTERY AND RIGHT SAPHENOUS LEG VEIN  HARVESTED ENDOSCOPICALLY;  Surgeon: Ivin Poot, MD;  Location: [facility];  Service: Open Heart Surgery;  Laterality: N/A;   EYE SURGERY     INSERTION OF MESH N/A 05/06/2016   Procedure: INSERTION OF MESH;  Surgeon: Georganna Skeans, MD;  Location: [place];  Service: General;  Laterality: N/A;   KNEE ARTHROSCOPY WITH MEDIAL MENISECTOMY  06/04/2012   Procedure: KNEE ARTHROSCOPY WITH MEDIAL MENISECTOMY;  Surgeon: Ninetta Lights, MD;  Location: [facility];  Service: Orthopedics;  Laterality: Right;  RIGHT SCOPE MEDIAL MENISCECTOMY, CHONDROPLASTY, EXCISION LOOSE BODY   LOWER EXTREMITY ANGIOGRAM N/A 11/23/2013   Procedure: LOWER EXTREMITY ANGIOGRAM;  Surgeon: Laverda Page, MD;  Location: [facility] C[facility];  Service: Cardiovascular;  Laterality: N/A;   NECK SURGERY N/A    02/19/2018   PERIPHERAL VASCULAR CATHETERIZATION N/A 12/21/2015   Procedure: Abdominal Aortogram;  Surgeon: Adrian Prows, MD;  Location: [place];  Service: Cardiovascular;  Laterality: N/A;   PERIPHERAL VASCULAR CATHETERIZATION N/A 12/21/2015   Procedure: Abdominal Aortogram w/Lower Extremity;  Surgeon: Adrian Prows, MD;  Location: [place] [facility];  Service: Cardiovascular;  Laterality: N/A;   TEE WITHOUT CARDIOVERSION N/A 01/02/2016   Procedure: TRANSESOPHAGEAL ECHOCARDIOGRAM (TEE);  Surgeon: Ivin Poot, MD;  Location: [place];  Service: Open Heart Surgery;  Laterality: N/A;   UMBILICAL HERNIA REPAIR N/A 05/06/2016   Procedure: HERNIA REPAIR UMBILICAL ADULT;  Surgeon: Georganna Skeans, MD;  Location: [place];  Service: General;  Laterality: N/A;   VIDEO BRONCHOSCOPY N/A 12/04/2021   Procedure: VIDEO BRONCHOSCOPY WITH FLUORO;  Surgeon: Collene Gobble, MD;  Location: [facility];  Service: Cardiopulmonary;  Laterality: N/A;    REVIEW OF SYSTEMS:  Constitutional: positive for fatigue Eyes: negative Ears, nose, mouth, throat, and face: negative Respiratory: positive for cough and dyspnea on  exertion Cardiovascular: negative Gastrointestinal: negative Genitourinary:negative Integument/breast: negative Hematologic/lymphatic: negative Musculoskeletal:negative Neurological: negative Behavioral/Psych: negative Endocrine: negative Allergic/Immunologic: negative   PHYSICAL EXAMINATION: General appearance: alert, cooperative, fatigued, and no distress Head: Normocephalic, without obvious abnormality, atraumatic Neck: no adenopathy, no JVD, supple, symmetrical, trachea midline, and thyroid not enlarged, symmetric, no tenderness/mass/nodules Lymph nodes: Cervical, supraclavicular, and axillary nodes normal. Resp: clear to auscultation bilaterally Back: symmetric, no curvature. ROM normal. No CVA tenderness. Cardio: regular rate and rhythm, S1, S2 normal, no murmur, click, rub or gallop GI: soft, non-tender; bowel sounds normal; no masses,  no organomegaly Extremities: extremities normal, atraumatic, no cyanosis or edema Neurologic: Alert and oriented X 3, normal strength and tone. Normal symmetric reflexes. Normal coordination and gait  ECOG PERFORMANCE STATUS: 1 - Symptomatic but completely ambulatory  Blood pressure 139/71, pulse 86, temperature 98.8 F (37.1 C), temperature source Tympanic, resp. rate 17, height 5' 10" (1.778 m), weight 201 lb 14.4 oz (91.6 kg), SpO2 93 %.  LABORATORY DATA: Lab Results  Component Value Date   WBC 8.1 12/07/2021   HGB 13.8 12/07/2021   HCT 40.4 12/07/2021   MCV 81.9 12/07/2021   PLT 194 12/07/2021      Chemistry      Component Value Date/Time   NA 137 12/07/2021 0939   NA 143 04/10/2018 1157   K 5.3 (H) 12/07/2021 0939   CL 102 12/07/2021 0939   CO2 30 12/07/2021 0939   BUN 13 12/07/2021 0939   BUN 8 04/10/2018 1157   CREATININE 0.85 12/07/2021 0939   CREATININE 0.78 05/02/2017 0000   CREATININE 0.71 10/26/2012 0903      Component Value Date/Time   CALCIUM 10.0 12/07/2021 0939   ALKPHOS 60 12/07/2021 0939   AST 13 (L)  12/07/2021 0939   ALT 12 12/07/2021 0939   BILITOT 0.5 12/07/2021 0939       RADIOGRAPHIC STUDIES: MR BRAIN W WO CONTRAST  Result Date: 12/18/2021 CLINICAL DATA:  Non-small-cell lung cancer, staging EXAM: MRI HEAD WITHOUT AND WITH CONTRAST TECHNIQUE: Multiplanar, multiecho pulse sequences of the brain and surrounding structures were obtained without and with intravenous contrast. CONTRAST:  63m GADAVIST GADOBUTROL 1 MMOL/ML IV SOLN COMPARISON:  CT head 04/13/2018 FINDINGS: Brain: There is no acute intracranial hemorrhage, extra-axial fluid collection, or acute infarct. There is mild global parenchymal volume loss with prominence of the ventricular system. Patchy FLAIR signal abnormality in the subcortical and periventricular white matter is nonspecific but likely reflects sequela of mild chronic white matter microangiopathy. There is no suspicious parenchymal signal abnormality. There is no abnormal enhancement. There is no mass lesion. There is no mass  effect or midline shift. Vascular: Normal flow voids. Skull and upper cervical spine: Normal marrow signal. Sinuses/Orbits: There is mild mucosal thickening in the maxillary sinuses. Bilateral lens implants are in place. The globes and orbits are otherwise unremarkable. Other: None. IMPRESSION: No evidence of intracranial metastatic disease. Electronically Signed   By: Valetta Mole M.D.   On: 12/18/2021 10:27   NM PET Image Initial (PI) Skull Base To Thigh (F-18 FDG)  Result Date: 12/14/2021 CLINICAL DATA:  Initial treatment strategy for pulmonary mass. EXAM: NUCLEAR MEDICINE PET SKULL BASE TO THIGH TECHNIQUE: 10.1 mCi F-18 FDG was injected intravenously. Full-ring PET imaging was performed from the skull base to thigh after the radiotracer. CT data was obtained and used for attenuation correction and anatomic localization. Fasting blood glucose: 148 mg/dl COMPARISON:  Chest CT 11/09/2021 FINDINGS: Mediastinal blood pool activity: SUV max Liver  activity: SUV max NA NECK: No hypermetabolic lymph nodes in the neck. Incidental CT findings: none CHEST: Large mass in the superior segment of the RIGHT lower lobe measures 6.5 x 5.9 cm and has intense metabolic activity with SUV max equal 9.6. Hypermetabolic activity is along the medial margin of the mass. The lateral margin of the mass is photopenic consistent with necrosis Mass shares a broad border with the pleural surface posteriorly but no evidence of chest wall invasion. Mass does extend to the hilar structures with a endobronchial component measuring 7 mm (image 34/7). There are mildly metabolic mediastinal lymph nodes. Subcarinal node with SUV max equal 2.7. RIGHT lower paratracheal node with SUV max equal 2.1. These nodes are upper limits of normal in size. No supraclavicular adenopathy. No axillary adenopathy. Incidental CT findings: none ABDOMEN/PELVIS: Choose one Scattered intense activity throughout the colon is favored physiologic. Incidental CT findings: Atherosclerotic calcification of the aorta. Small gallstones SKELETON: No focal hypermetabolic activity to suggest skeletal metastasis. Incidental CT findings: none IMPRESSION: 1. Large RIGHT lobe mass with intense metabolic activity consistent with primary bronchogenic carcinoma. 2. Mass shares a broad surface with the lung pleura but no evidence chest wall invasion. 3. Small endobronchial lesion along the central margin of the mass. 4. Several normal sized mediastinal lymph nodes with mild metabolic activity. Favor reactive adenopathy. 5. No evidence distant metastatic disease. Electronically Signed   By: Suzy Bouchard M.D.   On: 12/14/2021 16:40   DG C-Arm 1-60 Min-No Report  Result Date: 12/04/2021 Fluoroscopy was utilized by the requesting physician.  No radiographic interpretation.    ASSESSMENT AND PLAN: This is a very pleasant 71 years old white male with Stage IIIb (T4, N0/N2, M0) non-small cell lung cancer, squamous cell carcinoma  presented with large right lower lobe lung mass in addition to satellite nodules close to the mass and suspicious mediastinal lymphadenopathy diagnosed in May 2023. The patient had imaging studies including PET scan and MRI of the brain performed recently.  I personally and independently reviewed the scan images and discussed the result and showed the images to the patient and his wife. His MRI of the brain showed no evidence for metastatic disease to the brain.  The PET scan showed hypermetabolic activity in the large right lower lobe lung mass but the mediastinal lymph nodes have low FDG activity and likely reactive but definitely we could not exclude metastatic disease to these lymph nodes especially with the large right lower lobe lung mass. The patient is currently on concurrent chemoradiation with weekly carboplatin for AUC of 2 and paclitaxel 45 Mg/M2.  First dose starts today. I  recommended for the patient to proceed with the treatment as planned. I will see him back for follow-up visit in 2 weeks for evaluation before starting cycle #3. For the cough, I will give the patient refill of Hycodan. The patient was advised to call immediately if he has any other concerning symptoms in the interval. The patient voices understanding of current disease status and treatment options and is in agreement with the current care plan.  All questions were answered. The patient knows to call the clinic with any problems, questions or concerns. We can certainly see the patient much sooner if necessary.  The total time spent in the appointment was 30 minutes.  Disclaimer: This note was dictated with voice recognition software. Similar sounding words can inadvertently be transcribed and may not be corrected upon review.

## 2021-12-25 NOTE — Patient Instructions (Addendum)
[facility]  Discharge Instructions: Thank you for choosing [facility] to provide your oncology and hematology care.   If you have a lab appointment with the [facility], please go directly to the [facility] and check in at the registration area.   Wear comfortable clothing and clothing appropriate for easy access to any Portacath or PICC line.   We strive to give you quality time with your provider. You may need to reschedule your appointment if you arrive late (15 or more minutes).  Arriving late affects you and other patients whose appointments are after yours.  Also, if you miss three or more appointments without notifying the office, you may be dismissed from the clinic at the provider's discretion.      For prescription refill requests, have your pharmacy contact our office and allow 72 hours for refills to be completed.    Today you received the following chemotherapy and/or immunotherapy agents paclitaxel and carboplatin   To help prevent nausea and vomiting after your treatment, we encourage you to take your nausea medication as directed.  BELOW ARE SYMPTOMS THAT SHOULD BE REPORTED IMMEDIATELY: *FEVER GREATER THAN 100.4 F (38 C) OR HIGHER *CHILLS OR SWEATING *NAUSEA AND VOMITING THAT IS NOT CONTROLLED WITH YOUR NAUSEA MEDICATION *UNUSUAL SHORTNESS OF BREATH *UNUSUAL BRUISING OR BLEEDING *URINARY PROBLEMS (pain or burning when urinating, or frequent urination) *BOWEL PROBLEMS (unusual diarrhea, constipation, pain near the anus) TENDERNESS IN MOUTH AND THROAT WITH OR WITHOUT PRESENCE OF ULCERS (sore throat, sores in mouth, or a toothache) UNUSUAL RASH, SWELLING OR PAIN  UNUSUAL VAGINAL DISCHARGE OR ITCHING   Items with * indicate a potential emergency and should be followed up as soon as possible or go to the Emergency Department if any problems should occur.  Please show the CHEMOTHERAPY ALERT CARD or IMMUNOTHERAPY ALERT CARD at  check-in to the Emergency Department and triage nurse.  Should you have questions after your visit or need to cancel or reschedule your appointment, please contact [facility]  Dept: [phone]  and follow the prompts.  Office hours are 8:00 a.m. to 4:30 p.m. Monday - Friday. Please note that voicemails left after 4:00 p.m. may not be returned until the following business day.  We are closed weekends and major holidays. You have access to a nurse at all times for urgent questions. Please call the main number to the clinic Dept: [phone] and follow the prompts.   For any non-urgent questions, you may also contact your provider using MyChart. We now offer e-Visits for anyone 65 and older to request care online for non-urgent symptoms. For details visit [email or web address].   Also download the MyChart app! Go to the app store, search "MyChart", open the app, select [facility], and log in with your MyChart username and password.  Due to Covid, a mask is required upon entering the hospital/clinic. If you do not have a mask, one will be given to you upon arrival. For doctor visits, patients may have 1 support person aged 37 or older with them. For treatment visits, patients cannot have anyone with them due to current Covid guidelines and our immunocompromised population.   Paclitaxel injection What is this medication? PACLITAXEL (PAK li TAX el) is a chemotherapy drug. It targets fast dividing cells, like cancer cells, and causes these cells to die. This medicine is used to treat ovarian cancer, breast cancer, lung cancer, Kaposi's sarcoma, and other cancers. This medicine may be  used for other purposes; ask your health care provider or pharmacist if you have questions. COMMON BRAND NAME(S): Onxol, Taxol What should I tell my care team before I take this medication? They need to know if you have any of these conditions: history of irregular heartbeat liver  disease low blood counts, like low white cell, platelet, or red cell counts lung or breathing disease, like asthma tingling of the fingers or toes, or other nerve disorder an unusual or allergic reaction to paclitaxel, alcohol, polyoxyethylated castor oil, other chemotherapy, other medicines, foods, dyes, or preservatives pregnant or trying to get pregnant breast-feeding How should I use this medication? This drug is given as an infusion into a vein. It is administered in a hospital or clinic by a specially trained health care professional. Talk to your pediatrician regarding the use of this medicine in children. Special care may be needed. Overdosage: If you think you have taken too much of this medicine contact a poison control center or emergency room at once. NOTE: This medicine is only for you. Do not share this medicine with others. What if I miss a dose? It is important not to miss your dose. Call your doctor or health care professional if you are unable to keep an appointment. What may interact with this medication? Do not take this medicine with any of the following medications: live virus vaccines This medicine may also interact with the following medications: antiviral medicines for hepatitis, HIV or AIDS certain antibiotics like erythromycin and clarithromycin certain medicines for fungal infections like ketoconazole and itraconazole certain medicines for seizures like carbamazepine, phenobarbital, phenytoin gemfibrozil nefazodone rifampin St. John's wort This list may not describe all possible interactions. Give your health care provider a list of all the medicines, herbs, non-prescription drugs, or dietary supplements you use. Also tell them if you smoke, drink alcohol, or use illegal drugs. Some items may interact with your medicine. What should I watch for while using this medication? Your condition will be monitored carefully while you are receiving this medicine. You  will need important blood work done while you are taking this medicine. This medicine can cause serious allergic reactions. To reduce your risk you will need to take other medicine(s) before treatment with this medicine. If you experience allergic reactions like skin rash, itching or hives, swelling of the face, lips, or tongue, tell your doctor or health care professional right away. In some cases, you may be given additional medicines to help with side effects. Follow all directions for their use. This drug may make you feel generally unwell. This is not uncommon, as chemotherapy can affect healthy cells as well as cancer cells. Report any side effects. Continue your course of treatment even though you feel ill unless your doctor tells you to stop. Call your doctor or health care professional for advice if you get a fever, chills or sore throat, or other symptoms of a cold or flu. Do not treat yourself. This drug decreases your body's ability to fight infections. Try to avoid being around people who are sick. This medicine may increase your risk to bruise or bleed. Call your doctor or health care professional if you notice any unusual bleeding. Be careful brushing and flossing your teeth or using a toothpick because you may get an infection or bleed more easily. If you have any dental work done, tell your dentist you are receiving this medicine. Avoid taking products that contain aspirin, acetaminophen, ibuprofen, naproxen, or ketoprofen unless instructed by your doctor.  These medicines may hide a fever. Do not become pregnant while taking this medicine. Women should inform their doctor if they wish to become pregnant or think they might be pregnant. There is a potential for serious side effects to an unborn child. Talk to your health care professional or pharmacist for more information. Do not breast-feed an infant while taking this medicine. Men are advised not to father a child while receiving this  medicine. This product may contain alcohol. Ask your pharmacist or healthcare provider if this medicine contains alcohol. Be sure to tell all healthcare providers you are taking this medicine. Certain medicines, like metronidazole and disulfiram, can cause an unpleasant reaction when taken with alcohol. The reaction includes flushing, headache, nausea, vomiting, sweating, and increased thirst. The reaction can last from 30 minutes to several hours. What side effects may I notice from receiving this medication? Side effects that you should report to your doctor or health care professional as soon as possible: allergic reactions like skin rash, itching or hives, swelling of the face, lips, or tongue breathing problems changes in vision fast, irregular heartbeat high or low blood pressure mouth sores pain, tingling, numbness in the hands or feet signs of decreased platelets or bleeding - bruising, pinpoint red spots on the skin, black, tarry stools, blood in the urine signs of decreased red blood cells - unusually weak or tired, feeling faint or lightheaded, falls signs of infection - fever or chills, cough, sore throat, pain or difficulty passing urine signs and symptoms of liver injury like dark yellow or brown urine; general ill feeling or flu-like symptoms; light-colored stools; loss of appetite; nausea; right upper belly pain; unusually weak or tired; yellowing of the eyes or skin swelling of the ankles, feet, hands unusually slow heartbeat Side effects that usually do not require medical attention (report to your doctor or health care professional if they continue or are bothersome): diarrhea hair loss loss of appetite muscle or joint pain nausea, vomiting pain, redness, or irritation at site where injected tiredness This list may not describe all possible side effects. Call your doctor for medical advice about side effects. You may report side effects to FDA at 1-800-FDA-1088. Where  should I keep my medication? This drug is given in a hospital or clinic and will not be stored at home. NOTE: This sheet is a summary. It may not cover all possible information. If you have questions about this medicine, talk to your doctor, pharmacist, or health care provider.  2023 Elsevier/Gold Standard (2021-06-08 00:00:00)  Carboplatin injection What is this medication? CARBOPLATIN (KAR boe pla tin) is a chemotherapy drug. It targets fast dividing cells, like cancer cells, and causes these cells to die. This medicine is used to treat ovarian cancer and many other cancers. This medicine may be used for other purposes; ask your health care provider or pharmacist if you have questions. COMMON BRAND NAME(S): Paraplatin What should I tell my care team before I take this medication? They need to know if you have any of these conditions: blood disorders hearing problems kidney disease recent or ongoing radiation therapy an unusual or allergic reaction to carboplatin, cisplatin, other chemotherapy, other medicines, foods, dyes, or preservatives pregnant or trying to get pregnant breast-feeding How should I use this medication? This drug is usually given as an infusion into a vein. It is administered in a hospital or clinic by a specially trained health care professional. Talk to your pediatrician regarding the use of this medicine in children. Special  care may be needed. Overdosage: If you think you have taken too much of this medicine contact a poison control center or emergency room at once. NOTE: This medicine is only for you. Do not share this medicine with others. What if I miss a dose? It is important not to miss a dose. Call your doctor or health care professional if you are unable to keep an appointment. What may interact with this medication? medicines for seizures medicines to increase blood counts like filgrastim, pegfilgrastim, sargramostim some antibiotics like amikacin,  gentamicin, neomycin, streptomycin, tobramycin vaccines Talk to your doctor or health care professional before taking any of these medicines: acetaminophen aspirin ibuprofen ketoprofen naproxen This list may not describe all possible interactions. Give your health care provider a list of all the medicines, herbs, non-prescription drugs, or dietary supplements you use. Also tell them if you smoke, drink alcohol, or use illegal drugs. Some items may interact with your medicine. What should I watch for while using this medication? Your condition will be monitored carefully while you are receiving this medicine. You will need important blood work done while you are taking this medicine. This drug may make you feel generally unwell. This is not uncommon, as chemotherapy can affect healthy cells as well as cancer cells. Report any side effects. Continue your course of treatment even though you feel ill unless your doctor tells you to stop. In some cases, you may be given additional medicines to help with side effects. Follow all directions for their use. Call your doctor or health care professional for advice if you get a fever, chills or sore throat, or other symptoms of a cold or flu. Do not treat yourself. This drug decreases your body's ability to fight infections. Try to avoid being around people who are sick. This medicine may increase your risk to bruise or bleed. Call your doctor or health care professional if you notice any unusual bleeding. Be careful brushing and flossing your teeth or using a toothpick because you may get an infection or bleed more easily. If you have any dental work done, tell your dentist you are receiving this medicine. Avoid taking products that contain aspirin, acetaminophen, ibuprofen, naproxen, or ketoprofen unless instructed by your doctor. These medicines may hide a fever. Do not become pregnant while taking this medicine. Women should inform their doctor if they wish  to become pregnant or think they might be pregnant. There is a potential for serious side effects to an unborn child. Talk to your health care professional or pharmacist for more information. Do not breast-feed an infant while taking this medicine. What side effects may I notice from receiving this medication? Side effects that you should report to your doctor or health care professional as soon as possible: allergic reactions like skin rash, itching or hives, swelling of the face, lips, or tongue signs of infection - fever or chills, cough, sore throat, pain or difficulty passing urine signs of decreased platelets or bleeding - bruising, pinpoint red spots on the skin, black, tarry stools, nosebleeds signs of decreased red blood cells - unusually weak or tired, fainting spells, lightheadedness breathing problems changes in hearing changes in vision chest pain high blood pressure low blood counts - This drug may decrease the number of white blood cells, red blood cells and platelets. You may be at increased risk for infections and bleeding. nausea and vomiting pain, swelling, redness or irritation at the injection site pain, tingling, numbness in the hands or feet problems with  balance, talking, walking trouble passing urine or change in the amount of urine Side effects that usually do not require medical attention (report to your doctor or health care professional if they continue or are bothersome): hair loss loss of appetite metallic taste in the mouth or changes in taste This list may not describe all possible side effects. Call your doctor for medical advice about side effects. You may report side effects to FDA at 1-800-FDA-1088. Where should I keep my medication? This drug is given in a hospital or clinic and will not be stored at home. NOTE: This sheet is a summary. It may not cover all possible information. If you have questions about this medicine, talk to your doctor, pharmacist,  or health care provider.  2023 Elsevier/Gold Standard (2007-12-16 00:00:00)

## 2021-12-26 ENCOUNTER — Telehealth: Payer: Self-pay

## 2021-12-26 ENCOUNTER — Ambulatory Visit
Admission: RE | Admit: 2021-12-26 | Discharge: 2021-12-26 | Disposition: A | Discharge status: Home / Self Care | Payer: Medicare HMO | Source: Ambulatory Visit | Attending: Radiation Oncology | Admitting: Radiation Oncology

## 2021-12-26 ENCOUNTER — Other Ambulatory Visit: Payer: Self-pay | Admitting: Internal Medicine

## 2021-12-26 ENCOUNTER — Other Ambulatory Visit: Payer: Self-pay

## 2021-12-26 DIAGNOSIS — Z87891 Personal history of nicotine dependence: Secondary | ICD-10-CM | POA: Diagnosis not present

## 2021-12-26 DIAGNOSIS — R052 Subacute cough: Secondary | ICD-10-CM | POA: Diagnosis not present

## 2021-12-26 DIAGNOSIS — Z5111 Encounter for antineoplastic chemotherapy: Secondary | ICD-10-CM | POA: Diagnosis not present

## 2021-12-26 DIAGNOSIS — Z79899 Other long term (current) drug therapy: Secondary | ICD-10-CM | POA: Diagnosis not present

## 2021-12-26 DIAGNOSIS — Z510 Encounter for antineoplastic radiation therapy: Secondary | ICD-10-CM | POA: Diagnosis not present

## 2021-12-26 DIAGNOSIS — R197 Diarrhea, unspecified: Secondary | ICD-10-CM | POA: Diagnosis not present

## 2021-12-26 DIAGNOSIS — E860 Dehydration: Secondary | ICD-10-CM | POA: Diagnosis not present

## 2021-12-26 DIAGNOSIS — C3431 Malignant neoplasm of lower lobe, right bronchus or lung: Secondary | ICD-10-CM | POA: Diagnosis not present

## 2021-12-26 LAB — RAD ONC ARIA SESSION SUMMARY
Course Elapsed Days: 1
Plan Fractions Treated to Date: 2
Plan Prescribed Dose Per Fraction: 2 Gy
Plan Total Fractions Prescribed: 33
Plan Total Prescribed Dose: 66 Gy
Reference Point Dosage Given to Date: 4 Gy
Reference Point Session Dosage Given: 2 Gy
Session Number: 2

## 2021-12-26 MED ORDER — HYDROCODONE BIT-HOMATROP MBR 5-1.5 MG/5ML PO SOLN: 5.0000 mL | Freq: Three times a day (TID) | ORAL | 0 refills | Status: DC | PRN

## 2021-12-26 NOTE — Telephone Encounter (Signed)
Pt called requesting cough medicine with hydrocodone. Pt referenced discussion from apt on 12/25/21.

## 2021-12-27 ENCOUNTER — Ambulatory Visit
Admission: RE | Admit: 2021-12-27 | Discharge: 2021-12-27 | Disposition: A | Discharge status: Home / Self Care | Payer: Medicare HMO | Source: Ambulatory Visit | Attending: Radiation Oncology | Admitting: Radiation Oncology

## 2021-12-27 ENCOUNTER — Other Ambulatory Visit: Payer: Self-pay

## 2021-12-27 DIAGNOSIS — Z510 Encounter for antineoplastic radiation therapy: Secondary | ICD-10-CM | POA: Diagnosis not present

## 2021-12-27 DIAGNOSIS — C3431 Malignant neoplasm of lower lobe, right bronchus or lung: Secondary | ICD-10-CM | POA: Diagnosis not present

## 2021-12-27 DIAGNOSIS — R052 Subacute cough: Secondary | ICD-10-CM | POA: Diagnosis not present

## 2021-12-27 DIAGNOSIS — E860 Dehydration: Secondary | ICD-10-CM | POA: Diagnosis not present

## 2021-12-27 DIAGNOSIS — Z87891 Personal history of nicotine dependence: Secondary | ICD-10-CM | POA: Diagnosis not present

## 2021-12-27 DIAGNOSIS — R197 Diarrhea, unspecified: Secondary | ICD-10-CM | POA: Diagnosis not present

## 2021-12-27 DIAGNOSIS — Z79899 Other long term (current) drug therapy: Secondary | ICD-10-CM | POA: Diagnosis not present

## 2021-12-27 DIAGNOSIS — Z5111 Encounter for antineoplastic chemotherapy: Secondary | ICD-10-CM | POA: Diagnosis not present

## 2021-12-27 LAB — RAD ONC ARIA SESSION SUMMARY
Course Elapsed Days: 2
Plan Fractions Treated to Date: 3
Plan Prescribed Dose Per Fraction: 2 Gy
Plan Total Fractions Prescribed: 33
Plan Total Prescribed Dose: 66 Gy
Reference Point Dosage Given to Date: 6 Gy
Reference Point Session Dosage Given: 2 Gy
Session Number: 3

## 2021-12-28 ENCOUNTER — Ambulatory Visit
Admission: RE | Admit: 2021-12-28 | Discharge: 2021-12-28 | Disposition: A | Discharge status: Home / Self Care | Payer: Medicare HMO | Source: Ambulatory Visit | Attending: Radiation Oncology | Admitting: Radiation Oncology

## 2021-12-28 ENCOUNTER — Telehealth: Payer: Self-pay

## 2021-12-28 ENCOUNTER — Other Ambulatory Visit: Payer: Self-pay

## 2021-12-28 DIAGNOSIS — Z510 Encounter for antineoplastic radiation therapy: Secondary | ICD-10-CM | POA: Diagnosis not present

## 2021-12-28 DIAGNOSIS — E860 Dehydration: Secondary | ICD-10-CM | POA: Diagnosis not present

## 2021-12-28 DIAGNOSIS — Z5111 Encounter for antineoplastic chemotherapy: Secondary | ICD-10-CM | POA: Diagnosis not present

## 2021-12-28 DIAGNOSIS — R052 Subacute cough: Secondary | ICD-10-CM | POA: Diagnosis not present

## 2021-12-28 DIAGNOSIS — Z87891 Personal history of nicotine dependence: Secondary | ICD-10-CM | POA: Diagnosis not present

## 2021-12-28 DIAGNOSIS — Z79899 Other long term (current) drug therapy: Secondary | ICD-10-CM | POA: Diagnosis not present

## 2021-12-28 DIAGNOSIS — C3431 Malignant neoplasm of lower lobe, right bronchus or lung: Secondary | ICD-10-CM | POA: Diagnosis not present

## 2021-12-28 DIAGNOSIS — R197 Diarrhea, unspecified: Secondary | ICD-10-CM | POA: Diagnosis not present

## 2021-12-28 LAB — RAD ONC ARIA SESSION SUMMARY
Course Elapsed Days: 3
Plan Fractions Treated to Date: 4
Plan Prescribed Dose Per Fraction: 2 Gy
Plan Total Fractions Prescribed: 33
Plan Total Prescribed Dose: 66 Gy
Reference Point Dosage Given to Date: 8 Gy
Reference Point Session Dosage Given: 2 Gy
Session Number: 4

## 2021-12-28 MED FILL — Dexamethasone Sodium Phosphate Inj 100 MG/10ML: INTRAMUSCULAR | Qty: 1 | Status: AC

## 2021-12-31 ENCOUNTER — Other Ambulatory Visit: Payer: Self-pay

## 2021-12-31 ENCOUNTER — Ambulatory Visit
Admission: RE | Admit: 2021-12-31 | Discharge: 2021-12-31 | Disposition: A | Discharge status: Home / Self Care | Payer: Medicare HMO | Source: Ambulatory Visit | Attending: Radiation Oncology | Admitting: Radiation Oncology

## 2021-12-31 ENCOUNTER — Inpatient Hospital Stay: Payer: Medicare HMO

## 2021-12-31 VITALS — BP 111/60 | HR 85 | Temp 98.4°F | Resp 17

## 2021-12-31 DIAGNOSIS — C3491 Malignant neoplasm of unspecified part of right bronchus or lung: Secondary | ICD-10-CM

## 2021-12-31 DIAGNOSIS — E860 Dehydration: Secondary | ICD-10-CM | POA: Diagnosis not present

## 2021-12-31 DIAGNOSIS — R052 Subacute cough: Secondary | ICD-10-CM | POA: Diagnosis not present

## 2021-12-31 DIAGNOSIS — C3431 Malignant neoplasm of lower lobe, right bronchus or lung: Secondary | ICD-10-CM | POA: Diagnosis not present

## 2021-12-31 DIAGNOSIS — Z79899 Other long term (current) drug therapy: Secondary | ICD-10-CM | POA: Diagnosis not present

## 2021-12-31 DIAGNOSIS — Z87891 Personal history of nicotine dependence: Secondary | ICD-10-CM | POA: Diagnosis not present

## 2021-12-31 DIAGNOSIS — Z5111 Encounter for antineoplastic chemotherapy: Secondary | ICD-10-CM | POA: Diagnosis not present

## 2021-12-31 DIAGNOSIS — Z510 Encounter for antineoplastic radiation therapy: Secondary | ICD-10-CM | POA: Diagnosis not present

## 2021-12-31 DIAGNOSIS — R197 Diarrhea, unspecified: Secondary | ICD-10-CM | POA: Diagnosis not present

## 2021-12-31 LAB — CMP (CANCER CENTER ONLY)
ALT: 12 U/L (ref 0–44)
AST: 14 U/L — ABNORMAL LOW (ref 15–41)
Albumin: 3.5 g/dL (ref 3.5–5.0)
Alkaline Phosphatase: 47 U/L (ref 38–126)
Anion gap: 8 (ref 5–15)
BUN: 14 mg/dL (ref 8–23)
CO2: 24 mmol/L (ref 22–32)
Calcium: 9.8 mg/dL (ref 8.9–10.3)
Chloride: 101 mmol/L (ref 98–111)
Creatinine: 0.85 mg/dL (ref 0.61–1.24)
GFR, Estimated: >60 mL/min (ref >60)
Glucose, Bld: 167 mg/dL — ABNORMAL HIGH (ref 70–99)
Potassium: 4.3 mmol/L (ref 3.5–5.1)
Sodium: 133 mmol/L — ABNORMAL LOW (ref 135–145)
Total Bilirubin: 0.6 mg/dL (ref 0.3–1.2)
Total Protein: 7 g/dL (ref 6.5–8.1)

## 2021-12-31 LAB — CBC WITH DIFFERENTIAL (CANCER CENTER ONLY)
Abs Immature Granulocytes: 0.05 10*3/uL (ref 0.00–0.07)
Basophils Absolute: 0 10*3/uL (ref 0.0–0.1)
Basophils Relative: 1 %
Eosinophils Absolute: 0.1 10*3/uL (ref 0.0–0.5)
Eosinophils Relative: 3 %
HCT: 34.5 % — ABNORMAL LOW (ref 39.0–52.0)
Hemoglobin: 11.7 g/dL — ABNORMAL LOW (ref 13.0–17.0)
Immature Granulocytes: 1 %
Lymphocytes Relative: 13 %
Lymphs Abs: 0.6 10*3/uL — ABNORMAL LOW (ref 0.7–4.0)
MCH: 27.5 pg (ref 26.0–34.0)
MCHC: 33.9 g/dL (ref 30.0–36.0)
MCV: 81.2 fL (ref 80.0–100.0)
Monocytes Absolute: 0.3 10*3/uL (ref 0.1–1.0)
Monocytes Relative: 7 %
Neutro Abs: 3.2 10*3/uL (ref 1.7–7.7)
Neutrophils Relative %: 75 %
Platelet Count: 211 10*3/uL (ref 150–400)
RBC: 4.25 MIL/uL (ref 4.22–5.81)
RDW: 15.1 % (ref 11.5–15.5)
WBC Count: 4.3 10*3/uL (ref 4.0–10.5)
nRBC: 0 % (ref 0.0–0.2)

## 2021-12-31 LAB — RAD ONC ARIA SESSION SUMMARY
Course Elapsed Days: 6
Plan Fractions Treated to Date: 5
Plan Prescribed Dose Per Fraction: 2 Gy
Plan Total Fractions Prescribed: 33
Plan Total Prescribed Dose: 66 Gy
Reference Point Dosage Given to Date: 10 Gy
Reference Point Session Dosage Given: 2 Gy
Session Number: 5

## 2021-12-31 MED ORDER — SODIUM CHLORIDE 0.9 % IV SOLN
45.0000 mg/m2 | Freq: Once | INTRAVENOUS | Status: AC
  Administered 2021-12-31: 96 mg via INTRAVENOUS
  Filled 2021-12-31: qty 16

## 2021-12-31 MED ORDER — SODIUM CHLORIDE 0.9 % IV SOLN
10.0000 mg | Freq: Once | INTRAVENOUS | Status: AC
  Administered 2021-12-31: 10 mg via INTRAVENOUS
  Filled 2021-12-31: qty 10

## 2021-12-31 MED ORDER — SODIUM CHLORIDE 0.9 % IV SOLN: Freq: Once | INTRAVENOUS | Status: AC

## 2021-12-31 MED ORDER — DIPHENHYDRAMINE HCL 50 MG/ML IJ SOLN
50.0000 mg | Freq: Once | INTRAMUSCULAR | Status: AC
  Administered 2021-12-31: 50 mg via INTRAVENOUS
  Filled 2021-12-31: qty 1

## 2021-12-31 MED ORDER — FAMOTIDINE IN NACL 20-0.9 MG/50ML-% IV SOLN
20.0000 mg | Freq: Once | INTRAVENOUS | Status: AC
  Administered 2021-12-31: 20 mg via INTRAVENOUS
  Filled 2021-12-31: qty 50

## 2021-12-31 MED ORDER — SODIUM CHLORIDE 0.9 % IV SOLN
223.8000 mg | Freq: Once | INTRAVENOUS | Status: AC
  Administered 2021-12-31: 220 mg via INTRAVENOUS
  Filled 2021-12-31: qty 22

## 2021-12-31 MED ORDER — PALONOSETRON HCL INJECTION 0.25 MG/5ML
0.2500 mg | Freq: Once | INTRAVENOUS | Status: AC
  Administered 2021-12-31: 0.25 mg via INTRAVENOUS
  Filled 2021-12-31: qty 5

## 2021-12-31 NOTE — Patient Instructions (Signed)
[facility]  Discharge Instructions: Thank you for choosing [facility] to provide your oncology and hematology care.   If you have a lab appointment with the [facility], please go directly to the [facility] and check in at the registration area.   Wear comfortable clothing and clothing appropriate for easy access to any Portacath or PICC line.   We strive to give you quality time with your provider. You may need to reschedule your appointment if you arrive late (15 or more minutes).  Arriving late affects you and other patients whose appointments are after yours.  Also, if you miss three or more appointments without notifying the office, you may be dismissed from the clinic at the provider's discretion.      For prescription refill requests, have your pharmacy contact our office and allow 72 hours for refills to be completed.    Today you received the following chemotherapy and/or immunotherapy agents: Paclitaxel and Carboplatin      To help prevent nausea and vomiting after your treatment, we encourage you to take your nausea medication as directed.  BELOW ARE SYMPTOMS THAT SHOULD BE REPORTED IMMEDIATELY: *FEVER GREATER THAN 100.4 F (38 C) OR HIGHER *CHILLS OR SWEATING *NAUSEA AND VOMITING THAT IS NOT CONTROLLED WITH YOUR NAUSEA MEDICATION *UNUSUAL SHORTNESS OF BREATH *UNUSUAL BRUISING OR BLEEDING *URINARY PROBLEMS (pain or burning when urinating, or frequent urination) *BOWEL PROBLEMS (unusual diarrhea, constipation, pain near the anus) TENDERNESS IN MOUTH AND THROAT WITH OR WITHOUT PRESENCE OF ULCERS (sore throat, sores in mouth, or a toothache) UNUSUAL RASH, SWELLING OR PAIN  UNUSUAL VAGINAL DISCHARGE OR ITCHING   Items with * indicate a potential emergency and should be followed up as soon as possible or go to the Emergency Department if any problems should occur.  Please show the CHEMOTHERAPY ALERT CARD or IMMUNOTHERAPY ALERT  CARD at check-in to the Emergency Department and triage nurse.  Should you have questions after your visit or need to cancel or reschedule your appointment, please contact [facility]  Dept: [phone]  and follow the prompts.  Office hours are 8:00 a.m. to 4:30 p.m. Monday - Friday. Please note that voicemails left after 4:00 p.m. may not be returned until the following business day.  We are closed weekends and major holidays. You have access to a nurse at all times for urgent questions. Please call the main number to the clinic Dept: [phone] and follow the prompts.   For any non-urgent questions, you may also contact your provider using MyChart. We now offer e-Visits for anyone 13 and older to request care online for non-urgent symptoms. For details visit [email or web address].   Also download the MyChart app! Go to the app store, search "MyChart", open the app, select [facility], and log in with your MyChart username and password.  Due to Covid, a mask is required upon entering the hospital/clinic. If you do not have a mask, one will be given to you upon arrival. For doctor visits, patients may have 1 support person aged 29 or older with them. For treatment visits, patients cannot have anyone with them due to current Covid guidelines and our immunocompromised population.

## 2022-01-01 ENCOUNTER — Other Ambulatory Visit: Payer: Self-pay

## 2022-01-01 ENCOUNTER — Ambulatory Visit
Admission: RE | Admit: 2022-01-01 | Discharge: 2022-01-01 | Disposition: A | Discharge status: Home / Self Care | Payer: Medicare HMO | Source: Ambulatory Visit | Attending: Radiation Oncology | Admitting: Radiation Oncology

## 2022-01-01 DIAGNOSIS — Z510 Encounter for antineoplastic radiation therapy: Secondary | ICD-10-CM | POA: Diagnosis not present

## 2022-01-01 DIAGNOSIS — C3431 Malignant neoplasm of lower lobe, right bronchus or lung: Secondary | ICD-10-CM | POA: Diagnosis not present

## 2022-01-01 DIAGNOSIS — Z87891 Personal history of nicotine dependence: Secondary | ICD-10-CM | POA: Diagnosis not present

## 2022-01-01 DIAGNOSIS — Z79899 Other long term (current) drug therapy: Secondary | ICD-10-CM | POA: Diagnosis not present

## 2022-01-01 DIAGNOSIS — R052 Subacute cough: Secondary | ICD-10-CM | POA: Diagnosis not present

## 2022-01-01 DIAGNOSIS — R197 Diarrhea, unspecified: Secondary | ICD-10-CM | POA: Diagnosis not present

## 2022-01-01 DIAGNOSIS — E860 Dehydration: Secondary | ICD-10-CM | POA: Diagnosis not present

## 2022-01-01 DIAGNOSIS — Z5111 Encounter for antineoplastic chemotherapy: Secondary | ICD-10-CM | POA: Diagnosis not present

## 2022-01-01 LAB — RAD ONC ARIA SESSION SUMMARY
Course Elapsed Days: 7
Plan Fractions Treated to Date: 6
Plan Prescribed Dose Per Fraction: 2 Gy
Plan Total Fractions Prescribed: 33
Plan Total Prescribed Dose: 66 Gy
Reference Point Dosage Given to Date: 12 Gy
Reference Point Session Dosage Given: 2 Gy
Session Number: 6

## 2022-01-02 ENCOUNTER — Other Ambulatory Visit: Payer: Self-pay

## 2022-01-02 ENCOUNTER — Ambulatory Visit
Admission: RE | Admit: 2022-01-02 | Discharge: 2022-01-02 | Disposition: A | Discharge status: Home / Self Care | Payer: Medicare HMO | Source: Ambulatory Visit | Attending: Radiation Oncology | Admitting: Radiation Oncology

## 2022-01-02 DIAGNOSIS — C3431 Malignant neoplasm of lower lobe, right bronchus or lung: Secondary | ICD-10-CM | POA: Diagnosis not present

## 2022-01-02 DIAGNOSIS — Z79899 Other long term (current) drug therapy: Secondary | ICD-10-CM | POA: Diagnosis not present

## 2022-01-02 DIAGNOSIS — Z5111 Encounter for antineoplastic chemotherapy: Secondary | ICD-10-CM | POA: Diagnosis not present

## 2022-01-02 DIAGNOSIS — Z510 Encounter for antineoplastic radiation therapy: Secondary | ICD-10-CM | POA: Diagnosis not present

## 2022-01-02 DIAGNOSIS — Z87891 Personal history of nicotine dependence: Secondary | ICD-10-CM | POA: Diagnosis not present

## 2022-01-02 DIAGNOSIS — R052 Subacute cough: Secondary | ICD-10-CM | POA: Diagnosis not present

## 2022-01-02 DIAGNOSIS — E860 Dehydration: Secondary | ICD-10-CM | POA: Diagnosis not present

## 2022-01-02 DIAGNOSIS — R197 Diarrhea, unspecified: Secondary | ICD-10-CM | POA: Diagnosis not present

## 2022-01-02 LAB — RAD ONC ARIA SESSION SUMMARY
Course Elapsed Days: 8
Plan Fractions Treated to Date: 7
Plan Prescribed Dose Per Fraction: 2 Gy
Plan Total Fractions Prescribed: 33
Plan Total Prescribed Dose: 66 Gy
Reference Point Dosage Given to Date: 14 Gy
Reference Point Session Dosage Given: 2 Gy
Session Number: 7

## 2022-01-03 ENCOUNTER — Ambulatory Visit
Admission: RE | Admit: 2022-01-03 | Discharge: 2022-01-03 | Disposition: A | Discharge status: Home / Self Care | Payer: Medicare HMO | Source: Ambulatory Visit | Attending: Radiation Oncology | Admitting: Radiation Oncology

## 2022-01-03 ENCOUNTER — Other Ambulatory Visit: Payer: Self-pay

## 2022-01-03 ENCOUNTER — Ambulatory Visit: Payer: Medicare HMO | Admitting: Emergency Medicine

## 2022-01-03 ENCOUNTER — Other Ambulatory Visit: Payer: Self-pay | Admitting: Cardiology

## 2022-01-03 DIAGNOSIS — R052 Subacute cough: Secondary | ICD-10-CM | POA: Diagnosis not present

## 2022-01-03 DIAGNOSIS — E782 Mixed hyperlipidemia: Secondary | ICD-10-CM

## 2022-01-03 DIAGNOSIS — E860 Dehydration: Secondary | ICD-10-CM | POA: Diagnosis not present

## 2022-01-03 DIAGNOSIS — R197 Diarrhea, unspecified: Secondary | ICD-10-CM | POA: Diagnosis not present

## 2022-01-03 DIAGNOSIS — Z87891 Personal history of nicotine dependence: Secondary | ICD-10-CM | POA: Diagnosis not present

## 2022-01-03 DIAGNOSIS — I10 Essential (primary) hypertension: Secondary | ICD-10-CM

## 2022-01-03 DIAGNOSIS — Z510 Encounter for antineoplastic radiation therapy: Secondary | ICD-10-CM | POA: Diagnosis not present

## 2022-01-03 DIAGNOSIS — Z79899 Other long term (current) drug therapy: Secondary | ICD-10-CM | POA: Diagnosis not present

## 2022-01-03 DIAGNOSIS — Z5111 Encounter for antineoplastic chemotherapy: Secondary | ICD-10-CM | POA: Diagnosis not present

## 2022-01-03 DIAGNOSIS — C3431 Malignant neoplasm of lower lobe, right bronchus or lung: Secondary | ICD-10-CM | POA: Diagnosis not present

## 2022-01-03 LAB — RAD ONC ARIA SESSION SUMMARY
Course Elapsed Days: 9
Plan Fractions Treated to Date: 8
Plan Prescribed Dose Per Fraction: 2 Gy
Plan Total Fractions Prescribed: 33
Plan Total Prescribed Dose: 66 Gy
Reference Point Dosage Given to Date: 16 Gy
Reference Point Session Dosage Given: 2 Gy
Session Number: 8

## 2022-01-04 ENCOUNTER — Ambulatory Visit
Admission: RE | Admit: 2022-01-04 | Discharge: 2022-01-04 | Disposition: A | Discharge status: Home / Self Care | Payer: Medicare HMO | Source: Ambulatory Visit | Attending: Radiation Oncology | Admitting: Radiation Oncology

## 2022-01-04 ENCOUNTER — Other Ambulatory Visit: Payer: Self-pay

## 2022-01-04 ENCOUNTER — Ambulatory Visit: Payer: Medicare HMO

## 2022-01-04 DIAGNOSIS — Z87891 Personal history of nicotine dependence: Secondary | ICD-10-CM | POA: Diagnosis not present

## 2022-01-04 DIAGNOSIS — Z5111 Encounter for antineoplastic chemotherapy: Secondary | ICD-10-CM | POA: Diagnosis not present

## 2022-01-04 DIAGNOSIS — Z510 Encounter for antineoplastic radiation therapy: Secondary | ICD-10-CM | POA: Diagnosis not present

## 2022-01-04 DIAGNOSIS — R197 Diarrhea, unspecified: Secondary | ICD-10-CM | POA: Diagnosis not present

## 2022-01-04 DIAGNOSIS — E860 Dehydration: Secondary | ICD-10-CM | POA: Diagnosis not present

## 2022-01-04 DIAGNOSIS — R052 Subacute cough: Secondary | ICD-10-CM | POA: Diagnosis not present

## 2022-01-04 DIAGNOSIS — Z79899 Other long term (current) drug therapy: Secondary | ICD-10-CM | POA: Diagnosis not present

## 2022-01-04 DIAGNOSIS — C3431 Malignant neoplasm of lower lobe, right bronchus or lung: Secondary | ICD-10-CM | POA: Diagnosis not present

## 2022-01-04 LAB — RAD ONC ARIA SESSION SUMMARY
Course Elapsed Days: 10
Plan Fractions Treated to Date: 9
Plan Prescribed Dose Per Fraction: 2 Gy
Plan Total Fractions Prescribed: 33
Plan Total Prescribed Dose: 66 Gy
Reference Point Dosage Given to Date: 18 Gy
Reference Point Session Dosage Given: 2 Gy
Session Number: 9

## 2022-01-04 MED FILL — Dexamethasone Sodium Phosphate Inj 100 MG/10ML: INTRAMUSCULAR | Qty: 1 | Status: AC

## 2022-01-07 ENCOUNTER — Inpatient Hospital Stay (HOSPITAL_BASED_OUTPATIENT_CLINIC_OR_DEPARTMENT_OTHER): Payer: Medicare HMO | Admitting: Internal Medicine

## 2022-01-07 ENCOUNTER — Inpatient Hospital Stay: Payer: Medicare HMO

## 2022-01-07 ENCOUNTER — Other Ambulatory Visit: Payer: Self-pay

## 2022-01-07 ENCOUNTER — Ambulatory Visit
Admission: RE | Admit: 2022-01-07 | Discharge: 2022-01-07 | Disposition: A | Discharge status: Home / Self Care | Payer: Medicare HMO | Source: Ambulatory Visit | Attending: Radiation Oncology | Admitting: Radiation Oncology

## 2022-01-07 VITALS — BP 104/60 | HR 93 | Temp 98.1°F | Resp 16 | Ht 70.0 in | Wt 196.9 lb

## 2022-01-07 DIAGNOSIS — Z5111 Encounter for antineoplastic chemotherapy: Secondary | ICD-10-CM

## 2022-01-07 DIAGNOSIS — Z510 Encounter for antineoplastic radiation therapy: Secondary | ICD-10-CM | POA: Diagnosis not present

## 2022-01-07 DIAGNOSIS — E860 Dehydration: Secondary | ICD-10-CM

## 2022-01-07 DIAGNOSIS — C3491 Malignant neoplasm of unspecified part of right bronchus or lung: Secondary | ICD-10-CM

## 2022-01-07 DIAGNOSIS — R197 Diarrhea, unspecified: Secondary | ICD-10-CM | POA: Diagnosis not present

## 2022-01-07 DIAGNOSIS — C3431 Malignant neoplasm of lower lobe, right bronchus or lung: Secondary | ICD-10-CM | POA: Diagnosis not present

## 2022-01-07 DIAGNOSIS — Z79899 Other long term (current) drug therapy: Secondary | ICD-10-CM | POA: Diagnosis not present

## 2022-01-07 DIAGNOSIS — R052 Subacute cough: Secondary | ICD-10-CM | POA: Diagnosis not present

## 2022-01-07 DIAGNOSIS — Z87891 Personal history of nicotine dependence: Secondary | ICD-10-CM | POA: Diagnosis not present

## 2022-01-07 LAB — RAD ONC ARIA SESSION SUMMARY
Course Elapsed Days: 13
Plan Fractions Treated to Date: 10
Plan Prescribed Dose Per Fraction: 2 Gy
Plan Total Fractions Prescribed: 33
Plan Total Prescribed Dose: 66 Gy
Reference Point Dosage Given to Date: 20 Gy
Reference Point Session Dosage Given: 2 Gy
Session Number: 10

## 2022-01-07 LAB — CBC WITH DIFFERENTIAL (CANCER CENTER ONLY)
Abs Immature Granulocytes: 0.03 10*3/uL (ref 0.00–0.07)
Basophils Absolute: 0 10*3/uL (ref 0.0–0.1)
Basophils Relative: 0 %
Eosinophils Absolute: 0 10*3/uL (ref 0.0–0.5)
Eosinophils Relative: 0 %
HCT: 34.8 % — ABNORMAL LOW (ref 39.0–52.0)
Hemoglobin: 11.8 g/dL — ABNORMAL LOW (ref 13.0–17.0)
Immature Granulocytes: 1 %
Lymphocytes Relative: 7 %
Lymphs Abs: 0.4 10*3/uL — ABNORMAL LOW (ref 0.7–4.0)
MCH: 27.4 pg (ref 26.0–34.0)
MCHC: 33.9 g/dL (ref 30.0–36.0)
MCV: 80.7 fL (ref 80.0–100.0)
Monocytes Absolute: 0.6 10*3/uL (ref 0.1–1.0)
Monocytes Relative: 12 %
Neutro Abs: 4.4 10*3/uL (ref 1.7–7.7)
Neutrophils Relative %: 80 %
Platelet Count: 265 10*3/uL (ref 150–400)
RBC: 4.31 MIL/uL (ref 4.22–5.81)
RDW: 15 % (ref 11.5–15.5)
WBC Count: 5.4 10*3/uL (ref 4.0–10.5)
nRBC: 0 % (ref 0.0–0.2)

## 2022-01-07 LAB — CMP (CANCER CENTER ONLY)
ALT: 8 U/L (ref 0–44)
AST: 10 U/L — ABNORMAL LOW (ref 15–41)
Albumin: 3.5 g/dL (ref 3.5–5.0)
Alkaline Phosphatase: 49 U/L (ref 38–126)
Anion gap: 9 (ref 5–15)
BUN: 22 mg/dL (ref 8–23)
CO2: 22 mmol/L (ref 22–32)
Calcium: 9.6 mg/dL (ref 8.9–10.3)
Chloride: 100 mmol/L (ref 98–111)
Creatinine: 0.97 mg/dL (ref 0.61–1.24)
GFR, Estimated: >60 mL/min (ref >60)
Glucose, Bld: 186 mg/dL — ABNORMAL HIGH (ref 70–99)
Potassium: 3.5 mmol/L (ref 3.5–5.1)
Sodium: 131 mmol/L — ABNORMAL LOW (ref 135–145)
Total Bilirubin: 0.7 mg/dL (ref 0.3–1.2)
Total Protein: 7.1 g/dL (ref 6.5–8.1)

## 2022-01-07 MED ORDER — SODIUM CHLORIDE 0.9 % IV SOLN: INTRAVENOUS | Status: AC

## 2022-01-07 NOTE — Progress Notes (Signed)
[place] [phone]   [phone]  OFFICE PROGRESS NOTE  [name], MD [place] [address]  DIAGNOSIS:  Stage IIIb (T4, N0/N2, M0) non-small cell lung cancer, squamous cell carcinoma presented with large right lower lobe lung mass in addition to satellite nodules close to the mass and suspicious mediastinal lymphadenopathy diagnosed in May 2023  PD-L1 expression 1%  PRIOR THERAPY: None  CURRENT THERAPY: A course of concurrent chemoradiation with weekly carboplatin for AUC of 2 and paclitaxel 45 Mg/M2.  First dose of chemotherapy starts 12/25/2021.  Status post 2 cycles.  INTERVAL HISTORY: Jorge Adams 71 y.o. male returns to the clinic today for follow-up visit accompanied by his wife.  The patient is feeling more tired and fatigue as well as weak in the lower extremities started on Saturday with diarrhea.  He is not sure if he ate something bad that day.  His diarrhea is getting better today.  He has been using over-the-counter antidiarrhea medicine with little improvement.  He denied having any current nausea, vomiting, abdominal pain or constipation.  He has no fever or chills.  He lost few pounds since his last visit.  He also continues to have cough and currently on Hycodan.  He is here today for evaluation before starting cycle #3 of his treatment.   MEDICAL HISTORY: Past Medical History:  Diagnosis Date   Arthritis    ASHD (arteriosclerotic heart disease)    Atherosclerosis of native arteries of the extremities with intermittent claudication    Cataract    Coronary atherosclerosis of native coronary artery    Diabetes mellitus    NIDDM   Dyslipidemia    ED (erectile dysfunction)    Full dentures    Glaucoma associated with ocular disorder, mild stage    bilateral   Hyperlipidemia    Hypertension    under control, has been on med. > 9 yr.   Impotence of organic origin    Meniscus tear 05/2012    right knee   Myocardial infarction [facility]) 10/2002   Stented coronary artery     ALLERGIES:  has No Known Allergies.  MEDICATIONS:  Current Outpatient Medications  Medication Sig Dispense Refill   albuterol (VENTOLIN HFA) 108 (90 Base) MCG/ACT inhaler Inhale 1-2 puffs into the lungs every 6 (six) hours as needed (for cough.  okay to fill with albuterol/proair/ventolin.). 8 g 2   amLODipine-benazepril (LOTREL) 5-20 MG capsule TAKE 1 CAPSULE BY MOUTH EVERY DAY 90 capsule 3   aspirin EC 81 MG tablet Take 1 tablet (81 mg total) by mouth daily. Okay to restart this medication on 12/05/2021. 30 tablet 11   benzonatate (TESSALON) 200 MG capsule Take 1 capsule (200 mg total) by mouth 3 (three) times daily as needed. 30 capsule 1   blood glucose meter kit and supplies Dispense based on pt and insurance preference. Use up to 3 times daily as directed. (Dx. E11.9). 1 each 12   Blood Pressure Monitoring (SPHYGMOMANOMETER) MISC Use daily to check BP.  Dx I10 1 each 0   brimonidine (ALPHAGAN P) 0.1 % SOLN Place 1 drop into both eyes 2 (two) times daily.     cilostazol (PLETAL) 100 MG tablet Take 1 tablet (100 mg total) by mouth 2 (two) times daily. Okay to restart this medication on 12/05/2021. 180 tablet 1   dorzolamide-timolol (COSOPT) 22.3-6.8 MG/ML ophthalmic solution Place 1 drop into both eyes 2 (two) times daily.  fenofibrate (TRICOR) 145 MG tablet TAKE 1 TABLET BY MOUTH EVERY DAY IN THE EVENING 90 tablet 1   HYDROcodone bit-homatropine (HYCODAN) 5-1.5 MG/5ML syrup Take 5 mLs by mouth every 8 (eight) hours as needed for cough (sedation caution.). 120 mL 0   metFORMIN (GLUCOPHAGE) 500 MG tablet Take 1 tablet (500 mg total) by mouth 2 (two) times daily with a meal.     metoprolol tartrate (LOPRESSOR) 25 MG tablet TAKE 1 TABLET BY MOUTH TWICE A DAY 180 tablet 4   Netarsudil-Latanoprost (ROCKLATAN) 0.02-0.005 % SOLN Apply 1 drop to eye at bedtime. In both eyes     nitroGLYCERIN (NITROSTAT) 0.4 MG SL  tablet Place 1 tablet (0.4 mg total) under the tongue every 5 (five) minutes as needed for chest pain. (Patient not taking: Reported on 12/25/2021) 25 tablet 5   omega-3 acid ethyl esters (LOVAZA) 1 g capsule TAKE 1 CAPSULE BY MOUTH TWICE A DAY 180 capsule 1   prochlorperazine (COMPAZINE) 10 MG tablet Take 1 tablet (10 mg total) by mouth every 6 (six) hours as needed for nausea or vomiting. (Patient not taking: Reported on 12/25/2021) 30 tablet 0   rosuvastatin (CRESTOR) 20 MG tablet TAKE 1 TABLET BY MOUTH EVERY DAY 90 tablet 0   No current facility-administered medications for this visit.    SURGICAL HISTORY:  Past Surgical History:  Procedure Laterality Date   ANTERIOR CERVICAL DECOMP/DISCECTOMY FUSION N/A 05/07/2018   Procedure: ANTERIOR CERVICAL DECOMPRESSION/DISCECTOMY FUSION CERVICAL FIVE - CERVICAL SIX;  Surgeon: Consuella Lose, MD;  Location: [facility];  Service: Neurosurgery;  Laterality: N/A;  ANTERIOR CERVICAL DECOMPRESSION/DISCECTOMY FUSION CERVICAL FIVE - CERVICAL SIX   BRONCHIAL BIOPSY  12/04/2021   Procedure: BRONCHIAL BIOPSIES;  Surgeon: Collene Gobble, MD;  Location: [facility];  Service: Cardiopulmonary;;   BRONCHIAL BRUSHINGS  12/04/2021   Procedure: BRONCHIAL BRUSHINGS;  Surgeon: Collene Gobble, MD;  Location: [facility];  Service: Cardiopulmonary;;   BRONCHIAL NEEDLE ASPIRATION BIOPSY  12/04/2021   Procedure: BRONCHIAL NEEDLE ASPIRATION BIOPSIES;  Surgeon: Collene Gobble, MD;  Location: [facility];  Service: Cardiopulmonary;;   CARDIAC CATHETERIZATION  10/23/2002   had 1 stent placed then-- by Dr. Melvern Banker   CARDIAC CATHETERIZATION N/A 12/21/2015   Procedure: Left Heart Cath and Coronary Angiography;  Surgeon: Adrian Prows, MD;  Location: [place] [facility];  Service: Cardiovascular;  Laterality: N/A;   CATARACT EXTRACTION Right 05/2017   CATARACT EXTRACTION Left 07/2017   COLONOSCOPY  10/08/2010   Dr.Patterson   CORONARY ARTERY BYPASS GRAFT N/A 01/02/2016   Procedure:  CORONARY ARTERY BYPASS GRAFTING (CABG) TIMES 3 USING LEFT INTERNAL MAMMARY ARTERY AND RIGHT SAPHENOUS LEG VEIN HARVESTED ENDOSCOPICALLY;  Surgeon: Ivin Poot, MD;  Location: [place];  Service: Open Heart Surgery;  Laterality: N/A;   EYE SURGERY     INSERTION OF MESH N/A 05/06/2016   Procedure: INSERTION OF MESH;  Surgeon: Georganna Skeans, MD;  Location: [place];  Service: General;  Laterality: N/A;   KNEE ARTHROSCOPY WITH MEDIAL MENISECTOMY  06/04/2012   Procedure: KNEE ARTHROSCOPY WITH MEDIAL MENISECTOMY;  Surgeon: Ninetta Lights, MD;  Location: [facility];  Service: Orthopedics;  Laterality: Right;  RIGHT SCOPE MEDIAL MENISCECTOMY, CHONDROPLASTY, EXCISION LOOSE BODY   LOWER EXTREMITY ANGIOGRAM N/A 11/23/2013   Procedure: LOWER EXTREMITY ANGIOGRAM;  Surgeon: Laverda Page, MD;  Location: [facility] CATH LAB;  Service: Cardiovascular;  Laterality: N/A;   NECK SURGERY N/A    02/19/2018   PERIPHERAL VASCULAR CATHETERIZATION N/A 12/21/2015  Procedure: Abdominal Aortogram;  Surgeon: Adrian Prows, MD;  Location: [place] [facility];  Service: Cardiovascular;  Laterality: N/A;   PERIPHERAL VASCULAR CATHETERIZATION N/A 12/21/2015   Procedure: Abdominal Aortogram w/Lower Extremity;  Surgeon: Adrian Prows, MD;  Location: [place] [facility];  Service: Cardiovascular;  Laterality: N/A;   TEE WITHOUT CARDIOVERSION N/A 01/02/2016   Procedure: TRANSESOPHAGEAL ECHOCARDIOGRAM (TEE);  Surgeon: Ivin Poot, MD;  Location: [place] [facility];  Service: Open Heart Surgery;  Laterality: N/A;   UMBILICAL HERNIA REPAIR N/A 05/06/2016   Procedure: HERNIA REPAIR UMBILICAL ADULT;  Surgeon: Georganna Skeans, MD;  Location: [place];  Service: General;  Laterality: N/A;   VIDEO BRONCHOSCOPY N/A 12/04/2021   Procedure: VIDEO BRONCHOSCOPY WITH FLUORO;  Surgeon: Collene Gobble, MD;  Location: [place];  Service: Cardiopulmonary;  Laterality: N/A;    REVIEW OF SYSTEMS:  Constitutional: positive for fatigue and weight loss Eyes:  negative Ears, nose, mouth, throat, and face: negative Respiratory: positive for cough Cardiovascular: negative Gastrointestinal: positive for diarrhea Genitourinary:negative Integument/breast: negative Hematologic/lymphatic: negative Musculoskeletal:positive for muscle weakness Neurological: negative Behavioral/Psych: negative Endocrine: negative Allergic/Immunologic: negative   PHYSICAL EXAMINATION: General appearance: alert, cooperative, fatigued, and no distress Head: Normocephalic, without obvious abnormality, atraumatic Neck: no adenopathy, no JVD, supple, symmetrical, trachea midline, and thyroid not enlarged, symmetric, no tenderness/mass/nodules Lymph nodes: Cervical, supraclavicular, and axillary nodes normal. Resp: clear to auscultation bilaterally Back: symmetric, no curvature. ROM normal. No CVA tenderness. Cardio: regular rate and rhythm, S1, S2 normal, no murmur, click, rub or gallop GI: soft, non-tender; bowel sounds normal; no masses,  no organomegaly Extremities: extremities normal, atraumatic, no cyanosis or edema Neurologic: Alert and oriented X 3, normal strength and tone. Normal symmetric reflexes. Normal coordination and gait  ECOG PERFORMANCE STATUS: 1 - Symptomatic but completely ambulatory  Blood pressure 104/60, pulse 93, temperature 98.1 F (36.7 C), temperature source Oral, resp. rate 16, height _0  (1.778 m), weight 196 lb 14.4 oz (89.3 kg), SpO2 94 %.  LABORATORY DATA: Lab Results  Component Value Date   WBC 5.4 01/07/2022   HGB 11.8 (L) 01/07/2022   HCT 34.8 (L) 01/07/2022   MCV 80.7 01/07/2022   PLT 265 01/07/2022      Chemistry      Component Value Date/Time   NA 133 (L) 12/31/2021 0804   NA 143 04/10/2018 1157   K 4.3 12/31/2021 0804   CL 101 12/31/2021 0804   CO2 24 12/31/2021 0804   BUN 14 12/31/2021 0804   BUN 8 04/10/2018 1157   CREATININE 0.85 12/31/2021 0804   CREATININE 0.78 05/02/2017 0000   CREATININE 0.71 10/26/2012  0903      Component Value Date/Time   CALCIUM 9.8 12/31/2021 0804   ALKPHOS 47 12/31/2021 0804   AST 14 (L) 12/31/2021 0804   ALT 12 12/31/2021 0804   BILITOT 0.6 12/31/2021 0804       RADIOGRAPHIC STUDIES: MR BRAIN W WO CONTRAST  Result Date: 12/18/2021 CLINICAL DATA:  Non-small-cell lung cancer, staging EXAM: MRI HEAD WITHOUT AND WITH CONTRAST TECHNIQUE: Multiplanar, multiecho pulse sequences of the brain and surrounding structures were obtained without and with intravenous contrast. CONTRAST:  90m GADAVIST GADOBUTROL 1 MMOL/ML IV SOLN COMPARISON:  CT head 04/13/2018 FINDINGS: Brain: There is no acute intracranial hemorrhage, extra-axial fluid collection, or acute infarct. There is mild global parenchymal volume loss with prominence of the ventricular system. Patchy FLAIR signal abnormality in the subcortical and periventricular white matter is nonspecific but likely reflects sequela of mild chronic  white matter microangiopathy. There is no suspicious parenchymal signal abnormality. There is no abnormal enhancement. There is no mass lesion. There is no mass effect or midline shift. Vascular: Normal flow voids. Skull and upper cervical spine: Normal marrow signal. Sinuses/Orbits: There is mild mucosal thickening in the maxillary sinuses. Bilateral lens implants are in place. The globes and orbits are otherwise unremarkable. Other: None. IMPRESSION: No evidence of intracranial metastatic disease. Electronically Signed   By: Valetta Mole M.D.   On: 12/18/2021 10:27   NM PET Image Initial (PI) Skull Base To Thigh (F-18 FDG)  Result Date: 12/14/2021 CLINICAL DATA:  Initial treatment strategy for pulmonary mass. EXAM: NUCLEAR MEDICINE PET SKULL BASE TO THIGH TECHNIQUE: 10.1 mCi F-18 FDG was injected intravenously. Full-ring PET imaging was performed from the skull base to thigh after the radiotracer. CT data was obtained and used for attenuation correction and anatomic localization. Fasting blood  glucose: 148 mg/dl COMPARISON:  Chest CT 11/09/2021 FINDINGS: Mediastinal blood pool activity: SUV max Liver activity: SUV max NA NECK: No hypermetabolic lymph nodes in the neck. Incidental CT findings: none CHEST: Large mass in the superior segment of the RIGHT lower lobe measures 6.5 x 5.9 cm and has intense metabolic activity with SUV max equal 9.6. Hypermetabolic activity is along the medial margin of the mass. The lateral margin of the mass is photopenic consistent with necrosis Mass shares a broad border with the pleural surface posteriorly but no evidence of chest wall invasion. Mass does extend to the hilar structures with a endobronchial component measuring 7 mm (image 34/7). There are mildly metabolic mediastinal lymph nodes. Subcarinal node with SUV max equal 2.7. RIGHT lower paratracheal node with SUV max equal 2.1. These nodes are upper limits of normal in size. No supraclavicular adenopathy. No axillary adenopathy. Incidental CT findings: none ABDOMEN/PELVIS: Choose one Scattered intense activity throughout the colon is favored physiologic. Incidental CT findings: Atherosclerotic calcification of the aorta. Small gallstones SKELETON: No focal hypermetabolic activity to suggest skeletal metastasis. Incidental CT findings: none IMPRESSION: 1. Large RIGHT lobe mass with intense metabolic activity consistent with primary bronchogenic carcinoma. 2. Mass shares a broad surface with the lung pleura but no evidence chest wall invasion. 3. Small endobronchial lesion along the central margin of the mass. 4. Several normal sized mediastinal lymph nodes with mild metabolic activity. Favor reactive adenopathy. 5. No evidence distant metastatic disease. Electronically Signed   By: Suzy Bouchard M.D.   On: 12/14/2021 16:40    ASSESSMENT AND PLAN: This is a very pleasant 71 years old white male with Stage IIIb (T4, N0/N2, M0) non-small cell lung cancer, squamous cell carcinoma presented with large right lower  lobe lung mass in addition to satellite nodules close to the mass and suspicious mediastinal lymphadenopathy diagnosed in May 2023. The patient is currently undergoing a course of concurrent chemoradiation with weekly carboplatin for AUC of 2 and paclitaxel 45 Mg/M2 status post 2 cycles.  He has been tolerating his treatment well except for several episodes of diarrhea and weakness in the lower extremities started on Saturday few days ago. The patient is not feeling well today and dehydrated. I recommended for him to hold his chemo today because of the generalized weakness and fatigue and dehydration. I will arrange for the patient to receive IV hydration with normal saline in the clinic today. For the cough, I will give him refill of Hycodan. The patient will resume his systemic treatment again next week. He will continue his current treatment with  radiation as planned. He will come back for follow-up visit in 2 weeks for evaluation before starting cycle #4. The patient was advised to call immediately if he has any other concerning symptoms in the interval. The patient voices understanding of current disease status and treatment options and is in agreement with the current care plan.  All questions were answered. The patient knows to call the clinic with any problems, questions or concerns. We can certainly see the patient much sooner if necessary.  The total time spent in the appointment was 35 minutes.  Disclaimer: This note was dictated with voice recognition software. Similar sounding words can inadvertently be transcribed and may not be corrected upon review.

## 2022-01-07 NOTE — Patient Instructions (Signed)
Rehydration, Adult Rehydration is the replacement of body fluids, salts, and minerals (electrolytes) that are lost during dehydration. Dehydration is when there is not enough water or other fluids in the body. This happens when you lose more fluids than you take in. Common causes of dehydration include: Not drinking enough fluids. This can occur when you are ill or doing activities that require a lot of energy, especially in hot weather. Conditions that cause loss of water or other fluids, such as diarrhea, vomiting, sweating, or urinating a lot. Other illnesses, such as fever or infection. Certain medicines, such as those that remove excess fluid from the body (diuretics). Symptoms of mild or moderate dehydration may include thirst, dry lips and mouth, and dizziness. Symptoms of severe dehydration may include increased heart rate, confusion, fainting, and not urinating. For severe dehydration, you may need to get fluids through an IV at the hospital. For mild or moderate dehydration, you can usually rehydrate at home by drinking certain fluids as told by your health care provider. What are the risks? Generally, rehydration is safe. However, taking in too much fluid (overhydration) can be a problem. This is rare. Overhydration can cause an electrolyte imbalance, kidney failure, or a decrease in salt (sodium) levels in the body. Supplies needed You will need an oral rehydration solution (ORS) if your health care provider tells you to use one. This is a drink to treat dehydration. It can be found in pharmacies and retail stores. How to rehydrate Fluids Follow instructions from your health care provider for rehydration. The kind of fluid and the amount you should drink depend on your condition. In general, you should choose drinks that you prefer. If told by your health care provider, drink an ORS. Make an ORS by following instructions on the package. Start by drinking small amounts, about  cup (120  mL) every 5-10 minutes. Slowly increase how much you drink until you have taken the amount recommended by your health care provider. Drink enough clear fluids to keep your urine pale yellow. If you were told to drink an ORS, finish it first, then start slowly drinking other clear fluids. Drink fluids such as: Water. This includes sparkling water and flavored water. Drinking only water can lead to having too little sodium in your body (hyponatremia). Follow the advice of your health care provider. Water from ice chips you suck on. Fruit juice with water you add to it (diluted). Sports drinks. Hot or cold herbal teas. Broth-based soups. Milk or milk products. Food Follow instructions from your health care provider about what to eat while you rehydrate. Your health care provider may recommend that you slowly begin eating regular foods in small amounts. Eat foods that contain a healthy balance of electrolytes, such as bananas, oranges, potatoes, tomatoes, and spinach. Avoid foods that are greasy or contain a lot of sugar. In some cases, you may get nutrition through a feeding tube that is passed through your nose and into your stomach (nasogastric tube, or NG tube). This may be done if you have uncontrolled vomiting or diarrhea. Beverages to avoid  Certain beverages may make dehydration worse. While you rehydrate, avoid drinking alcohol. How to tell if you are recovering from dehydration You may be recovering from dehydration if: You are urinating more often than before you started rehydrating. Your urine is pale yellow. Your energy level improves. You vomit less frequently. You have diarrhea less frequently. Your appetite improves or returns to normal. You feel less dizzy or less light-headed.   Your skin tone and color start to look more normal. Follow these instructions at home: Take over-the-counter and prescription medicines only as told by your health care provider. Do not take sodium  tablets. Doing this can lead to having too much sodium in your body (hypernatremia). Contact a health care provider if: You continue to have symptoms of mild or moderate dehydration, such as: Thirst. Dry lips. Slightly dry mouth. Dizziness. Dark urine or less urine than normal. Muscle cramps. You continue to vomit or have diarrhea. Get help right away if you: Have symptoms of dehydration that get worse. Have a fever. Have a severe headache. Have been vomiting and the following happens: Your vomiting gets worse or does not go away. Your vomit includes blood or green matter (bile). You cannot eat or drink without vomiting. Have problems with urination or bowel movements, such as: Diarrhea that gets worse or does not go away. Blood in your stool (feces). This may cause stool to look black and tarry. Not urinating, or urinating only a small amount of very dark urine, within 6-8 hours. Have trouble breathing. Have symptoms that get worse with treatment. These symptoms may represent a serious problem that is an emergency. Do not wait to see if the symptoms will go away. Get medical help right away. Call your local emergency services (911 in the [place] Do not drive yourself to the hospital. Summary Rehydration is the replacement of body fluids and minerals (electrolytes) that are lost during dehydration. Follow instructions from your health care provider for rehydration. The kind of fluid and amount you should drink depend on your condition. Slowly increase how much you drink until you have taken the amount recommended by your health care provider. Contact your health care provider if you continue to show signs of mild or moderate dehydration. This information is not intended to replace advice given to you by your health care provider. Make sure you discuss any questions you have with your health care provider. Document Revised: 09/08/2019 Document Reviewed: 07/19/2019 Elsevier Patient  Education  2023 [employer]  

## 2022-01-08 ENCOUNTER — Ambulatory Visit
Admission: RE | Admit: 2022-01-08 | Discharge: 2022-01-08 | Disposition: A | Discharge status: Home / Self Care | Payer: Medicare HMO | Source: Ambulatory Visit | Attending: Radiation Oncology | Admitting: Radiation Oncology

## 2022-01-08 ENCOUNTER — Other Ambulatory Visit: Payer: Self-pay

## 2022-01-08 ENCOUNTER — Other Ambulatory Visit: Payer: Self-pay | Admitting: Internal Medicine

## 2022-01-08 DIAGNOSIS — E860 Dehydration: Secondary | ICD-10-CM | POA: Diagnosis not present

## 2022-01-08 DIAGNOSIS — Z79899 Other long term (current) drug therapy: Secondary | ICD-10-CM | POA: Diagnosis not present

## 2022-01-08 DIAGNOSIS — R197 Diarrhea, unspecified: Secondary | ICD-10-CM | POA: Diagnosis not present

## 2022-01-08 DIAGNOSIS — Z5111 Encounter for antineoplastic chemotherapy: Secondary | ICD-10-CM | POA: Diagnosis not present

## 2022-01-08 DIAGNOSIS — Z510 Encounter for antineoplastic radiation therapy: Secondary | ICD-10-CM | POA: Diagnosis not present

## 2022-01-08 DIAGNOSIS — C3431 Malignant neoplasm of lower lobe, right bronchus or lung: Secondary | ICD-10-CM | POA: Diagnosis not present

## 2022-01-08 DIAGNOSIS — Z87891 Personal history of nicotine dependence: Secondary | ICD-10-CM | POA: Diagnosis not present

## 2022-01-08 DIAGNOSIS — R052 Subacute cough: Secondary | ICD-10-CM | POA: Diagnosis not present

## 2022-01-08 LAB — RAD ONC ARIA SESSION SUMMARY
Course Elapsed Days: 14
Plan Fractions Treated to Date: 11
Plan Prescribed Dose Per Fraction: 2 Gy
Plan Total Fractions Prescribed: 33
Plan Total Prescribed Dose: 66 Gy
Reference Point Dosage Given to Date: 22 Gy
Reference Point Session Dosage Given: 2 Gy
Session Number: 11

## 2022-01-08 MED ORDER — HYDROCODONE BIT-HOMATROP MBR 5-1.5 MG/5ML PO SOLN: 5.0000 mL | Freq: Three times a day (TID) | ORAL | 0 refills | Status: DC | PRN

## 2022-01-09 ENCOUNTER — Ambulatory Visit
Admission: RE | Admit: 2022-01-09 | Discharge: 2022-01-09 | Disposition: A | Discharge status: Home / Self Care | Payer: Medicare HMO | Source: Ambulatory Visit | Attending: Radiation Oncology | Admitting: Radiation Oncology

## 2022-01-09 ENCOUNTER — Other Ambulatory Visit: Payer: Self-pay

## 2022-01-09 DIAGNOSIS — Z79899 Other long term (current) drug therapy: Secondary | ICD-10-CM | POA: Diagnosis not present

## 2022-01-09 DIAGNOSIS — E860 Dehydration: Secondary | ICD-10-CM | POA: Diagnosis not present

## 2022-01-09 DIAGNOSIS — Z510 Encounter for antineoplastic radiation therapy: Secondary | ICD-10-CM | POA: Diagnosis not present

## 2022-01-09 DIAGNOSIS — C3431 Malignant neoplasm of lower lobe, right bronchus or lung: Secondary | ICD-10-CM | POA: Diagnosis not present

## 2022-01-09 DIAGNOSIS — Z87891 Personal history of nicotine dependence: Secondary | ICD-10-CM | POA: Diagnosis not present

## 2022-01-09 DIAGNOSIS — R052 Subacute cough: Secondary | ICD-10-CM | POA: Diagnosis not present

## 2022-01-09 DIAGNOSIS — Z5111 Encounter for antineoplastic chemotherapy: Secondary | ICD-10-CM | POA: Diagnosis not present

## 2022-01-09 DIAGNOSIS — R197 Diarrhea, unspecified: Secondary | ICD-10-CM | POA: Diagnosis not present

## 2022-01-09 LAB — RAD ONC ARIA SESSION SUMMARY
Course Elapsed Days: 15
Plan Fractions Treated to Date: 12
Plan Prescribed Dose Per Fraction: 2 Gy
Plan Total Fractions Prescribed: 33
Plan Total Prescribed Dose: 66 Gy
Reference Point Dosage Given to Date: 24 Gy
Reference Point Session Dosage Given: 2 Gy
Session Number: 12

## 2022-01-10 ENCOUNTER — Ambulatory Visit
Admission: RE | Admit: 2022-01-10 | Discharge: 2022-01-10 | Disposition: A | Discharge status: Home / Self Care | Payer: Medicare HMO | Source: Ambulatory Visit | Attending: Radiation Oncology | Admitting: Radiation Oncology

## 2022-01-10 ENCOUNTER — Other Ambulatory Visit: Payer: Self-pay

## 2022-01-10 DIAGNOSIS — Z5111 Encounter for antineoplastic chemotherapy: Secondary | ICD-10-CM | POA: Diagnosis not present

## 2022-01-10 DIAGNOSIS — Z79899 Other long term (current) drug therapy: Secondary | ICD-10-CM | POA: Diagnosis not present

## 2022-01-10 DIAGNOSIS — C3491 Malignant neoplasm of unspecified part of right bronchus or lung: Secondary | ICD-10-CM

## 2022-01-10 DIAGNOSIS — R052 Subacute cough: Secondary | ICD-10-CM | POA: Diagnosis not present

## 2022-01-10 DIAGNOSIS — R197 Diarrhea, unspecified: Secondary | ICD-10-CM | POA: Diagnosis not present

## 2022-01-10 DIAGNOSIS — Z510 Encounter for antineoplastic radiation therapy: Secondary | ICD-10-CM | POA: Diagnosis not present

## 2022-01-10 DIAGNOSIS — C3431 Malignant neoplasm of lower lobe, right bronchus or lung: Secondary | ICD-10-CM | POA: Diagnosis not present

## 2022-01-10 DIAGNOSIS — Z87891 Personal history of nicotine dependence: Secondary | ICD-10-CM | POA: Diagnosis not present

## 2022-01-10 DIAGNOSIS — E860 Dehydration: Secondary | ICD-10-CM | POA: Diagnosis not present

## 2022-01-10 LAB — RAD ONC ARIA SESSION SUMMARY
Course Elapsed Days: 16
Plan Fractions Treated to Date: 13
Plan Prescribed Dose Per Fraction: 2 Gy
Plan Total Fractions Prescribed: 33
Plan Total Prescribed Dose: 66 Gy
Reference Point Dosage Given to Date: 26 Gy
Reference Point Session Dosage Given: 2 Gy
Session Number: 13

## 2022-01-11 ENCOUNTER — Encounter: Payer: Self-pay | Admitting: *Deleted

## 2022-01-11 ENCOUNTER — Telehealth: Payer: Self-pay | Admitting: Internal Medicine

## 2022-01-11 ENCOUNTER — Other Ambulatory Visit: Payer: Self-pay

## 2022-01-11 ENCOUNTER — Other Ambulatory Visit: Payer: Self-pay | Admitting: Physician Assistant

## 2022-01-11 ENCOUNTER — Telehealth: Payer: Self-pay

## 2022-01-11 ENCOUNTER — Other Ambulatory Visit: Payer: Self-pay | Admitting: Family Medicine

## 2022-01-11 ENCOUNTER — Ambulatory Visit: Admission: RE | Admit: 2022-01-11 | Payer: Medicare HMO | Source: Ambulatory Visit

## 2022-01-11 ENCOUNTER — Ambulatory Visit
Admission: RE | Admit: 2022-01-11 | Discharge: 2022-01-11 | Disposition: A | Discharge status: Home / Self Care | Payer: Medicare HMO | Source: Ambulatory Visit | Attending: Radiation Oncology | Admitting: Radiation Oncology

## 2022-01-11 DIAGNOSIS — Z5111 Encounter for antineoplastic chemotherapy: Secondary | ICD-10-CM | POA: Diagnosis not present

## 2022-01-11 DIAGNOSIS — Z510 Encounter for antineoplastic radiation therapy: Secondary | ICD-10-CM | POA: Diagnosis not present

## 2022-01-11 DIAGNOSIS — R197 Diarrhea, unspecified: Secondary | ICD-10-CM | POA: Diagnosis not present

## 2022-01-11 DIAGNOSIS — R052 Subacute cough: Secondary | ICD-10-CM | POA: Diagnosis not present

## 2022-01-11 DIAGNOSIS — E860 Dehydration: Secondary | ICD-10-CM | POA: Diagnosis not present

## 2022-01-11 DIAGNOSIS — Z79899 Other long term (current) drug therapy: Secondary | ICD-10-CM | POA: Diagnosis not present

## 2022-01-11 DIAGNOSIS — Z87891 Personal history of nicotine dependence: Secondary | ICD-10-CM | POA: Diagnosis not present

## 2022-01-11 DIAGNOSIS — C3431 Malignant neoplasm of lower lobe, right bronchus or lung: Secondary | ICD-10-CM | POA: Diagnosis not present

## 2022-01-11 LAB — RAD ONC ARIA SESSION SUMMARY
Course Elapsed Days: 17
Plan Fractions Treated to Date: 14
Plan Prescribed Dose Per Fraction: 2 Gy
Plan Total Fractions Prescribed: 33
Plan Total Prescribed Dose: 66 Gy
Reference Point Dosage Given to Date: 28 Gy
Reference Point Session Dosage Given: 2 Gy
Session Number: 14

## 2022-01-11 MED ORDER — BENZONATATE 200 MG PO CAPS: 200.0000 mg | ORAL_CAPSULE | Freq: Three times a day (TID) | ORAL | 3 refills | Status: DC | PRN

## 2022-01-11 MED FILL — Dexamethasone Sodium Phosphate Inj 100 MG/10ML: INTRAMUSCULAR | Qty: 1 | Status: AC

## 2022-01-11 NOTE — Telephone Encounter (Signed)
Returned call to patient's wife about request for an antibiotic. The patient was evaluated by Dr. Kathrynn Running today, and he informed me that the patient does not need an antibiotic. I explained this to the wife, and she told me that the patient's cough was a lot worse. I encouraged her to reach out to his PCP to get a refill on Tessalon for the cough, and if the cough gets worse over the weekend to go to urgent care. The wife verbalized understanding and had no further questions or concerns.

## 2022-01-11 NOTE — Telephone Encounter (Signed)
Scheduled per 06/19 los, patient has been called and notified of upcoming appointments.  

## 2022-01-13 ENCOUNTER — Encounter: Payer: Self-pay | Admitting: Internal Medicine

## 2022-01-14 ENCOUNTER — Other Ambulatory Visit: Payer: Self-pay

## 2022-01-14 ENCOUNTER — Ambulatory Visit (HOSPITAL_COMMUNITY)
Admission: RE | Admit: 2022-01-14 | Discharge: 2022-01-14 | Disposition: A | Discharge status: Home / Self Care | Payer: Medicare HMO | Source: Ambulatory Visit | Attending: Physician Assistant | Admitting: Physician Assistant

## 2022-01-14 ENCOUNTER — Encounter: Payer: Medicare HMO | Admitting: Nutrition

## 2022-01-14 ENCOUNTER — Inpatient Hospital Stay (HOSPITAL_BASED_OUTPATIENT_CLINIC_OR_DEPARTMENT_OTHER): Payer: Medicare HMO | Admitting: Physician Assistant

## 2022-01-14 ENCOUNTER — Ambulatory Visit
Admission: RE | Admit: 2022-01-14 | Discharge: 2022-01-14 | Disposition: A | Discharge status: Home / Self Care | Payer: Medicare HMO | Source: Ambulatory Visit | Attending: Radiation Oncology | Admitting: Radiation Oncology

## 2022-01-14 ENCOUNTER — Inpatient Hospital Stay: Payer: Medicare HMO

## 2022-01-14 VITALS — BP 115/64 | HR 100 | Temp 98.2°F | Resp 16

## 2022-01-14 VITALS — BP 125/64 | HR 102 | Temp 98.0°F | Resp 18 | Wt 198.2 lb

## 2022-01-14 DIAGNOSIS — Z5111 Encounter for antineoplastic chemotherapy: Secondary | ICD-10-CM | POA: Diagnosis not present

## 2022-01-14 DIAGNOSIS — D649 Anemia, unspecified: Secondary | ICD-10-CM | POA: Diagnosis not present

## 2022-01-14 DIAGNOSIS — Z510 Encounter for antineoplastic radiation therapy: Secondary | ICD-10-CM | POA: Diagnosis not present

## 2022-01-14 DIAGNOSIS — Z79899 Other long term (current) drug therapy: Secondary | ICD-10-CM | POA: Diagnosis not present

## 2022-01-14 DIAGNOSIS — Z87891 Personal history of nicotine dependence: Secondary | ICD-10-CM | POA: Diagnosis not present

## 2022-01-14 DIAGNOSIS — E871 Hypo-osmolality and hyponatremia: Secondary | ICD-10-CM

## 2022-01-14 DIAGNOSIS — C3431 Malignant neoplasm of lower lobe, right bronchus or lung: Secondary | ICD-10-CM | POA: Diagnosis not present

## 2022-01-14 DIAGNOSIS — C3491 Malignant neoplasm of unspecified part of right bronchus or lung: Secondary | ICD-10-CM

## 2022-01-14 DIAGNOSIS — R509 Fever, unspecified: Secondary | ICD-10-CM | POA: Diagnosis not present

## 2022-01-14 DIAGNOSIS — E860 Dehydration: Secondary | ICD-10-CM | POA: Diagnosis not present

## 2022-01-14 DIAGNOSIS — J189 Pneumonia, unspecified organism: Secondary | ICD-10-CM | POA: Diagnosis not present

## 2022-01-14 DIAGNOSIS — R052 Subacute cough: Secondary | ICD-10-CM | POA: Insufficient documentation

## 2022-01-14 DIAGNOSIS — R197 Diarrhea, unspecified: Secondary | ICD-10-CM | POA: Diagnosis not present

## 2022-01-14 DIAGNOSIS — R059 Cough, unspecified: Secondary | ICD-10-CM | POA: Diagnosis not present

## 2022-01-14 LAB — CMP (CANCER CENTER ONLY)
ALT: 44 U/L (ref 0–44)
AST: 31 U/L (ref 15–41)
Albumin: 2.9 g/dL — ABNORMAL LOW (ref 3.5–5.0)
Alkaline Phosphatase: 83 U/L (ref 38–126)
Anion gap: 7 (ref 5–15)
BUN: 10 mg/dL (ref 8–23)
CO2: 27 mmol/L (ref 22–32)
Calcium: 9.4 mg/dL (ref 8.9–10.3)
Chloride: 96 mmol/L — ABNORMAL LOW (ref 98–111)
Creatinine: 0.75 mg/dL (ref 0.61–1.24)
GFR, Estimated: >60 mL/min (ref >60)
Glucose, Bld: 207 mg/dL — ABNORMAL HIGH (ref 70–99)
Potassium: 4.2 mmol/L (ref 3.5–5.1)
Sodium: 130 mmol/L — ABNORMAL LOW (ref 135–145)
Total Bilirubin: 0.6 mg/dL (ref 0.3–1.2)
Total Protein: 6.4 g/dL — ABNORMAL LOW (ref 6.5–8.1)

## 2022-01-14 LAB — CBC WITH DIFFERENTIAL (CANCER CENTER ONLY)
Abs Immature Granulocytes: 0.15 10*3/uL — ABNORMAL HIGH (ref 0.00–0.07)
Basophils Absolute: 0.1 10*3/uL (ref 0.0–0.1)
Basophils Relative: 0 %
Eosinophils Absolute: 0 10*3/uL (ref 0.0–0.5)
Eosinophils Relative: 0 %
HCT: 30.8 % — ABNORMAL LOW (ref 39.0–52.0)
Hemoglobin: 10.4 g/dL — ABNORMAL LOW (ref 13.0–17.0)
Immature Granulocytes: 1 %
Lymphocytes Relative: 2 %
Lymphs Abs: 0.3 10*3/uL — ABNORMAL LOW (ref 0.7–4.0)
MCH: 27.2 pg (ref 26.0–34.0)
MCHC: 33.8 g/dL (ref 30.0–36.0)
MCV: 80.4 fL (ref 80.0–100.0)
Monocytes Absolute: 0.9 10*3/uL (ref 0.1–1.0)
Monocytes Relative: 7 %
Neutro Abs: 10.8 10*3/uL — ABNORMAL HIGH (ref 1.7–7.7)
Neutrophils Relative %: 90 %
Platelet Count: 327 10*3/uL (ref 150–400)
RBC: 3.83 MIL/uL — ABNORMAL LOW (ref 4.22–5.81)
RDW: 15.2 % (ref 11.5–15.5)
WBC Count: 12.2 10*3/uL — ABNORMAL HIGH (ref 4.0–10.5)
nRBC: 0 % (ref 0.0–0.2)

## 2022-01-14 LAB — RAD ONC ARIA SESSION SUMMARY
Course Elapsed Days: 20
Plan Fractions Treated to Date: 1
Plan Prescribed Dose Per Fraction: 2 Gy
Plan Total Fractions Prescribed: 19
Plan Total Prescribed Dose: 38 Gy
Reference Point Dosage Given to Date: 30 Gy
Reference Point Session Dosage Given: 2 Gy
Session Number: 15

## 2022-01-14 MED ORDER — SODIUM CHLORIDE 0.9 % IV SOLN
45.0000 mg/m2 | Freq: Once | INTRAVENOUS | Status: AC
  Administered 2022-01-14: 96 mg via INTRAVENOUS
  Filled 2022-01-14: qty 16

## 2022-01-14 MED ORDER — HYDROCODONE BIT-HOMATROP MBR 5-1.5 MG/5ML PO SOLN: 5.0000 mL | Freq: Three times a day (TID) | ORAL | 0 refills | Status: DC | PRN

## 2022-01-14 MED ORDER — DIPHENHYDRAMINE HCL 50 MG/ML IJ SOLN
50.0000 mg | Freq: Once | INTRAMUSCULAR | Status: AC
  Administered 2022-01-14: 50 mg via INTRAVENOUS
  Filled 2022-01-14: qty 1

## 2022-01-14 MED ORDER — SODIUM CHLORIDE 0.9 % IV SOLN
10.0000 mg | Freq: Once | INTRAVENOUS | Status: AC
  Administered 2022-01-14: 10 mg via INTRAVENOUS
  Filled 2022-01-14: qty 10
  Filled 2022-01-14: qty 1

## 2022-01-14 MED ORDER — LEVOFLOXACIN 750 MG PO TABS: 750.0000 mg | ORAL_TABLET | Freq: Every day | ORAL | 0 refills | Status: AC

## 2022-01-14 MED ORDER — SODIUM CHLORIDE 0.9 % IV SOLN
220.0000 mg | Freq: Once | INTRAVENOUS | Status: AC
  Administered 2022-01-14: 220 mg via INTRAVENOUS
  Filled 2022-01-14: qty 22

## 2022-01-14 MED ORDER — SODIUM CHLORIDE 0.9 % IV SOLN: Freq: Once | INTRAVENOUS | Status: AC

## 2022-01-14 MED ORDER — FAMOTIDINE IN NACL 20-0.9 MG/50ML-% IV SOLN
20.0000 mg | Freq: Once | INTRAVENOUS | Status: AC
  Administered 2022-01-14: 20 mg via INTRAVENOUS
  Filled 2022-01-14: qty 50

## 2022-01-14 MED ORDER — PALONOSETRON HCL INJECTION 0.25 MG/5ML
0.2500 mg | Freq: Once | INTRAVENOUS | Status: AC
  Administered 2022-01-14: 0.25 mg via INTRAVENOUS
  Filled 2022-01-14: qty 5

## 2022-01-14 NOTE — Progress Notes (Signed)
Symptom Management Consult note [facility]    Patient Care Team: Joaquim Nam, MD as PCP - General (Family Medicine) Yates Decamp, MD as Consulting Physician (Cardiology) [place], Heather, [place] as Referring Physician (Optometry) Sallye Lat, MD as Consulting Physician (Ophthalmology) Phil Dopp, [facility] as Pharmacist (Pharmacist)    Name of the patient: Jorge Adams  [id number]  [birth year]   Date of visit: 01/14/2022    Chief complaint/ Reason for visit- cough  Oncology History  Stage III squamous cell carcinoma of right lung (HCC)  12/07/2021 Initial Diagnosis   Stage III squamous cell carcinoma of right lung (HCC)   12/07/2021 Cancer Staging   Staging form: Lung, AJCC 8th Edition - Clinical: Stage IIIB (cT4, cN2, cM0) - Signed by Si Gaul, MD on 12/07/2021   12/25/2021 -  Chemotherapy   Patient is on Treatment Plan : LUNG Carboplatin / Paclitaxel + XRT q7d       Current Therapy: carboplatin and taxol  Day 1 Cycle 2 on 12/31/21  Interval history- Jorge Adams is a 71 y.o. with oncologic history as above presenting to [facility] today with chief complaint of cough and fever x 1 week.  Spouse accompanies him and provides additional history.  She states ever since patient's lung cancer diagnosis he has had ongoing cough.  However over the last week cough has significantly worsened.  It is productive with brown sputum.  He is having difficulty sleeping because of the cough.  He had a temperature of 101 last Thursday.  He has been taking hydrocodone cough syrup every 12 hours and Tessalon Perles.  He feels like the medicines help him however once they wear off he feels terrible again.  He is tolerating p.o. intake and stay well-hydrated.  He does have decreased appetite since starting chemo and admits to his taste being off.  Patient also reports since starting chemo he has had fatigue and worsening generalized weakness.  He is not getting around as well  as he used to.  He denies any sick contacts.  Denies chills, hemoptysis, abdominal pain, nausea, urinary symptoms, diarrhea.      ROS  All other systems are reviewed and are negative for acute change except as noted in the HPI.    No Known Allergies   Past Medical History:  Diagnosis Date   Arthritis    ASHD (arteriosclerotic heart disease)    Atherosclerosis of native arteries of the extremities with intermittent claudication    Cataract    Coronary atherosclerosis of native coronary artery    Diabetes mellitus    NIDDM   Dyslipidemia    ED (erectile dysfunction)    Full dentures    Glaucoma associated with ocular disorder, mild stage    bilateral   Hyperlipidemia    Hypertension    under control, has been on med. > 9 yr.   Impotence of organic origin    Meniscus tear 05/2012   right knee   Myocardial infarction [facility]) 10/2002   Stented coronary artery      Past Surgical History:  Procedure Laterality Date   ANTERIOR CERVICAL DECOMP/DISCECTOMY FUSION N/A 05/07/2018   Procedure: ANTERIOR CERVICAL DECOMPRESSION/DISCECTOMY FUSION CERVICAL FIVE - CERVICAL SIX;  Surgeon: Lisbeth Renshaw, MD;  Location: [facility] OR;  Service: Neurosurgery;  Laterality: N/A;  ANTERIOR CERVICAL DECOMPRESSION/DISCECTOMY FUSION CERVICAL FIVE - CERVICAL SIX   BRONCHIAL BIOPSY  12/04/2021   Procedure: BRONCHIAL BIOPSIES;  Surgeon: Leslye Peer, MD;  Location: [facility] ENDOSCOPY;  Service: Cardiopulmonary;;   BRONCHIAL BRUSHINGS  12/04/2021   Procedure: BRONCHIAL BRUSHINGS;  Surgeon: Leslye Peer, MD;  Location: [facility] ENDOSCOPY;  Service: Cardiopulmonary;;   BRONCHIAL NEEDLE ASPIRATION BIOPSY  12/04/2021   Procedure: BRONCHIAL NEEDLE ASPIRATION BIOPSIES;  Surgeon: Leslye Peer, MD;  Location: [facility] ENDOSCOPY;  Service: Cardiopulmonary;;   CARDIAC CATHETERIZATION  10/23/2002   had 1 stent placed then-- by Dr. Elsie Lincoln   CARDIAC CATHETERIZATION N/A 12/21/2015   Procedure: Left Heart Cath and Coronary  Angiography;  Surgeon: Yates Decamp, MD;  Location: [facility] INVASIVE CV LAB;  Service: Cardiovascular;  Laterality: N/A;   CATARACT EXTRACTION Right 05/2017   CATARACT EXTRACTION Left 07/2017   COLONOSCOPY  10/08/2010   Dr.Patterson   CORONARY ARTERY BYPASS GRAFT N/A 01/02/2016   Procedure: CORONARY ARTERY BYPASS GRAFTING (CABG) TIMES 3 USING LEFT INTERNAL MAMMARY ARTERY AND RIGHT SAPHENOUS LEG VEIN HARVESTED ENDOSCOPICALLY;  Surgeon: Kerin Perna, MD;  Location: [facility] OR;  Service: Open Heart Surgery;  Laterality: N/A;   EYE SURGERY     INSERTION OF MESH N/A 05/06/2016   Procedure: INSERTION OF MESH;  Surgeon: Violeta Gelinas, MD;  Location: [facility] OR;  Service: General;  Laterality: N/A;   KNEE ARTHROSCOPY WITH MEDIAL MENISECTOMY  06/04/2012   Procedure: KNEE ARTHROSCOPY WITH MEDIAL MENISECTOMY;  Surgeon: Loreta Ave, MD;  Location: [facility];  Service: Orthopedics;  Laterality: Right;  RIGHT SCOPE MEDIAL MENISCECTOMY, CHONDROPLASTY, EXCISION LOOSE BODY   LOWER EXTREMITY ANGIOGRAM N/A 11/23/2013   Procedure: LOWER EXTREMITY ANGIOGRAM;  Surgeon: Pamella Pert, MD;  Location: [facility] CATH LAB;  Service: Cardiovascular;  Laterality: N/A;   NECK SURGERY N/A    02/19/2018   PERIPHERAL VASCULAR CATHETERIZATION N/A 12/21/2015   Procedure: Abdominal Aortogram;  Surgeon: Yates Decamp, MD;  Location: [facility] INVASIVE CV LAB;  Service: Cardiovascular;  Laterality: N/A;   PERIPHERAL VASCULAR CATHETERIZATION N/A 12/21/2015   Procedure: Abdominal Aortogram w/Lower Extremity;  Surgeon: Yates Decamp, MD;  Location: [facility] INVASIVE CV LAB;  Service: Cardiovascular;  Laterality: N/A;   TEE WITHOUT CARDIOVERSION N/A 01/02/2016   Procedure: TRANSESOPHAGEAL ECHOCARDIOGRAM (TEE);  Surgeon: Kerin Perna, MD;  Location: [facility] OR;  Service: Open Heart Surgery;  Laterality: N/A;   UMBILICAL HERNIA REPAIR N/A 05/06/2016   Procedure: HERNIA REPAIR UMBILICAL ADULT;  Surgeon: Violeta Gelinas, MD;  Location: [facility] OR;  Service:  General;  Laterality: N/A;   VIDEO BRONCHOSCOPY N/A 12/04/2021   Procedure: VIDEO BRONCHOSCOPY WITH FLUORO;  Surgeon: Leslye Peer, MD;  Location: [facility] ENDOSCOPY;  Service: Cardiopulmonary;  Laterality: N/A;    Social History   Socioeconomic History   Marital status: Married    Spouse name: Not on file   Number of children: 2   Years of education: Not on file   Highest education level: Not on file  Occupational History   Occupation: retired  Tobacco Use   Smoking status: Former    Packs/day: 2.50    Years: 35.00    Total pack years: 87.50    Types: Cigarettes    Quit date: 12/02/2002    Years since quitting: 19.1   Smokeless tobacco: Never  Vaping Use   Vaping Use: Never used  Substance and Sexual Activity   Alcohol use: Yes    Comment: rare   Drug use: No   Sexual activity: Not on file  Other Topics Concern   Not on file  Social History Narrative   Retired Merchandiser, retail with [employer], sales across  and [place]  Married 2012, 2 grown sons from prev relationship   Enjoys fishing.    Ailene Ards, [employer] fan   Social Determinants of Health   Financial Resource Strain: Low Risk  (01/30/2021)   Overall Financial Resource Strain (CARDIA)    Difficulty of Paying Living Expenses: Not hard at all  Food Insecurity: No Food Insecurity (01/30/2021)   Hunger Vital Sign    Worried About Running Out of Food in the Last Year: Never true    Ran Out of Food in the Last Year: Never true  Transportation Needs: No Transportation Needs (01/30/2021)   PRAPARE - Administrator, Civil Service (Medical): No    Lack of Transportation (Non-Medical): No  Physical Activity: Inactive (01/30/2021)   Exercise Vital Sign    Days of Exercise per Week: 0 days    Minutes of Exercise per Session: 0 min  Stress: No Stress Concern Present (01/30/2021)   [employer] of Occupational Health - Occupational Stress Questionnaire    Feeling of Stress : Not at all  Social Connections:  Not on file  Intimate Partner Violence: Not At Risk (01/30/2021)   Humiliation, Afraid, Rape, and Kick questionnaire    Fear of Current or Ex-Partner: No    Emotionally Abused: No    Physically Abused: No    Sexually Abused: No    Family History  Problem Relation Age of Onset   Bone cancer Mother    Pneumonia Father    Diabetes Brother    Heart disease Brother        Pacemaker   COPD Brother    Colon cancer Neg Hx    Prostate cancer Neg Hx    Esophageal cancer Neg Hx    Stomach cancer Neg Hx      Current Outpatient Medications:    levofloxacin (LEVAQUIN) 750 MG tablet, Take 1 tablet (750 mg total) by mouth daily for 5 days., Disp: 5 tablet, Rfl: 0   albuterol (VENTOLIN HFA) 108 (90 Base) MCG/ACT inhaler, Inhale 1-2 puffs into the lungs every 6 (six) hours as needed (for cough.  okay to fill with albuterol/proair/ventolin.)., Disp: 8 g, Rfl: 2   amLODipine-benazepril (LOTREL) 5-20 MG capsule, TAKE 1 CAPSULE BY MOUTH EVERY DAY, Disp: 90 capsule, Rfl: 3   aspirin EC 81 MG tablet, Take 1 tablet (81 mg total) by mouth daily. Okay to restart this medication on 12/05/2021., Disp: 30 tablet, Rfl: 11   benzonatate (TESSALON) 200 MG capsule, Take 1 capsule (200 mg total) by mouth 3 (three) times daily as needed., Disp: 30 capsule, Rfl: 3   blood glucose meter kit and supplies, Dispense based on pt and insurance preference. Use up to 3 times daily as directed. (Dx. E11.9)., Disp: 1 each, Rfl: 12   Blood Pressure Monitoring (SPHYGMOMANOMETER) MISC, Use daily to check BP.  Dx I10, Disp: 1 each, Rfl: 0   brimonidine (ALPHAGAN P) 0.1 % SOLN, Place 1 drop into both eyes 2 (two) times daily., Disp: , Rfl:    cilostazol (PLETAL) 100 MG tablet, Take 1 tablet (100 mg total) by mouth 2 (two) times daily. Okay to restart this medication on 12/05/2021., Disp: 180 tablet, Rfl: 1   dorzolamide-timolol (COSOPT) 22.3-6.8 MG/ML ophthalmic solution, Place 1 drop into both eyes 2 (two) times daily., Disp: , Rfl:     fenofibrate (TRICOR) 145 MG tablet, TAKE 1 TABLET BY MOUTH EVERY DAY IN THE EVENING, Disp: 90 tablet, Rfl: 1   HYDROcodone bit-homatropine (HYCODAN) 5-1.5 MG/5ML syrup, Take  5 mLs by mouth every 8 (eight) hours as needed for cough (sedation caution.)., Disp: 120 mL, Rfl: 0   metFORMIN (GLUCOPHAGE) 500 MG tablet, Take 1 tablet (500 mg total) by mouth 2 (two) times daily with a meal., Disp: , Rfl:    metoprolol tartrate (LOPRESSOR) 25 MG tablet, TAKE 1 TABLET BY MOUTH TWICE A DAY, Disp: 180 tablet, Rfl: 4   Netarsudil-Latanoprost (ROCKLATAN) 0.02-0.005 % SOLN, Apply 1 drop to eye at bedtime. In both eyes, Disp: , Rfl:    nitroGLYCERIN (NITROSTAT) 0.4 MG SL tablet, Place 1 tablet (0.4 mg total) under the tongue every 5 (five) minutes as needed for chest pain. (Patient not taking: Reported on 12/25/2021), Disp: 25 tablet, Rfl: 5   omega-3 acid ethyl esters (LOVAZA) 1 g capsule, TAKE 1 CAPSULE BY MOUTH TWICE A DAY, Disp: 180 capsule, Rfl: 1   prochlorperazine (COMPAZINE) 10 MG tablet, Take 1 tablet (10 mg total) by mouth every 6 (six) hours as needed for nausea or vomiting. (Patient not taking: Reported on 12/25/2021), Disp: 30 tablet, Rfl: 0   rosuvastatin (CRESTOR) 20 MG tablet, TAKE 1 TABLET BY MOUTH EVERY DAY, Disp: 90 tablet, Rfl: 0  PHYSICAL EXAM: ECOG FS:3 - Symptomatic, >50% confined to bed    Vitals:   01/14/22 1432 01/14/22 1436 01/14/22 1439 01/14/22 1458  BP:    115/64  Pulse: (!) 103 (!) 107 100   Resp:    16  Temp:    98.2 F (36.8 C)  TempSrc:    Oral  SpO2: 92% 91% 93%    Physical Exam Vitals and nursing note reviewed.  Constitutional:      Appearance: He is well-developed. He is not ill-appearing or toxic-appearing.  HENT:     Head: Normocephalic and atraumatic.     Nose: Nose normal.  Eyes:     General: No scleral icterus.       Right eye: No discharge.        Left eye: No discharge.     Conjunctiva/sclera: Conjunctivae normal.  Neck:     Vascular: No JVD.   Cardiovascular:     Rate and Rhythm: Normal rate and regular rhythm.     Pulses: Normal pulses.     Heart sounds: Normal heart sounds.  Pulmonary:     Effort: Pulmonary effort is normal.     Breath sounds: Normal breath sounds.     Comments: Decreased lung sounds in posterior right lung base. Faint rales heard in right lung base. Abdominal:     General: There is no distension.  Musculoskeletal:        General: Normal range of motion.     Cervical back: Normal range of motion.  Skin:    General: Skin is warm and dry.  Neurological:     Mental Status: He is oriented to person, place, and time.     GCS: GCS eye subscore is 4. GCS verbal subscore is 5. GCS motor subscore is 6.     Comments: Fluent speech, no facial droop.  Psychiatric:        Behavior: Behavior normal.        LABORATORY DATA: I have reviewed the data as listed    Latest Ref Rng & Units 01/14/2022    8:10 AM 01/07/2022    7:44 AM 12/31/2021    8:04 AM  CBC  WBC 4.0 - 10.5 K/uL 12.2  5.4  4.3   Hemoglobin 13.0 - 17.0 g/dL 69.6  29.5  11.7  Hematocrit 39.0 - 52.0 % 30.8  34.8  34.5   Platelets 150 - 400 K/uL 327  265  211         Latest Ref Rng & Units 01/14/2022    8:10 AM 01/07/2022    7:44 AM 12/31/2021    8:04 AM  CMP  Glucose 70 - 99 mg/dL 213  086  578   BUN 8 - 23 mg/dL 10  22  14    Creatinine 0.61 - 1.24 mg/dL 4.69  6.29  5.28   Sodium 135 - 145 mmol/L 130  131  133   Potassium 3.5 - 5.1 mmol/L 4.2  3.5  4.3   Chloride 98 - 111 mmol/L 96  100  101   CO2 22 - 32 mmol/L 27  22  24    Calcium 8.9 - 10.3 mg/dL 9.4  9.6  9.8   Total Protein 6.5 - 8.1 g/dL 6.4  7.1  7.0   Total Bilirubin 0.3 - 1.2 mg/dL 0.6  0.7  0.6   Alkaline Phos 38 - 126 U/L 83  49  47   AST 15 - 41 U/L 31  10  14    ALT 0 - 44 U/L 44  8  12        RADIOGRAPHIC STUDIES: I have personally reviewed the radiological images as listed and agreed with the findings in the report. No images are attached to the encounter. DG Chest  2 View  Result Date: 01/14/2022 CLINICAL DATA:  Cough, fever EXAM: CHEST - 2 VIEW COMPARISON:  Previous studies including the examination done on 11/06/2021 FINDINGS: Cardiac size is within normal limits. There is evidence of previous coronary bypass surgery. There are no signs of alveolar pulmonary edema. There is a large mass lesion with air fluid level in the posterior right mid to lower lung fields in the right lower lobe measuring proximally 9.4 x 9.2 by 11 cm. There is significant interval increase in size. Rest of the lung fields are essentially clear. There is no pleural effusion or pneumothorax. IMPRESSION: There is a large mass lesion measuring 11 cm in maximum diameter in the posterior right mid and right lower lung fields with interval increase in size. Findings suggest malignant neoplasm in the right lower lobe with interval progression. There is air-fluid level within the lesion suggesting possible necrosis in the neoplasm or suggest superimposed infectious process. Electronically Signed   By: Ernie Avena M.D.   On: 01/14/2022 14:07     ASSESSMENT & PLAN: Patient is a 71 y.o. male  with oncologic history of non-small cell lung cancer, squamous cell carcinoma followed by Dr. Arbutus Ped.  I have viewed most recent oncology note and lab work.  #)Cough-patient is nontoxic-appearing.  He is afebrile.  During clinic visit oxygen saturation ranges from 91 to 93% on room air.  Patient is not in respiratory distress.  Lung exam with faint rales heard in right posterior lung base.  Patient had lab work today prior to chemo which showed a leukocytosis of 12.2 which could be concerning for infection.  Patient denies any recent steroid use.  Given his subacute cough chest xray obtained and viewed by me. Patient has a known large lung mass in right mid and lower fields. There is concern for infection vs necrosis, I agree with radiologist impression. As patient is symptomatic with productive cough and new  leukocytosis will treat for infection with levaquin. Patient without hypoxia here in clinic.  Engaged in shared decision making regarding  pneumonia and treatment.  Patient is agreeable with plan to try outpatient antibiotic.  Patient also given an incentive spirometer.  Refill sent to pharmacy for Hyocdan syrup as well to help with cough related pain.   #)Anemia-hemoglobin today is 10.4 this is downtrending compared to last week when it was 11.8.  Baseline appears to be around 12-14.  Anemia is likely related to chemotherapy.  We will continue to be monitored at upcoming toxicity checks.  #) Hyponatremia-sodium today is 130 which is similar to labs in the last 2 weeks.  Likely related to malignancy.  Patient is not symptomatic from this.  He will need to have this rechecked by the oncologist at upcoming toxicity checks   #) Non-small cell lung cancer, squamous cell carcinoma-patient's next appointment with oncologist is next week, 01/21/2022.   Strict ED precautions discussed should symptoms worsen.     Visit Diagnosis: 1. Subacute cough   2. Stage III squamous cell carcinoma of right lung (HCC)   3. Anemia, unspecified type   4. Hyponatremia   5. Pneumonia of right lower lobe due to infectious organism      Orders Placed This Encounter  Procedures   DG Chest 2 View    Standing Status:   Future    Number of Occurrences:   1    Standing Expiration Date:   01/14/2023    Order Specific Question:   Reason for Exam (SYMPTOM  OR DIAGNOSIS REQUIRED)    Answer:   cough    Order Specific Question:   Preferred imaging location?    Answer:   [facility]    All questions were answered. The patient knows to call the clinic with any problems, questions or concerns. No barriers to learning was detected.  I have spent a total of 30 minutes minutes of face-to-face and non-face-to-face time, preparing to see the patient, obtaining and/or reviewing separately obtained history, performing a  medically appropriate examination, counseling and educating the patient, ordering tests,  documenting clinical information in the electronic health record, and care coordination.     Thank you for allowing me to participate in the care of this patient.    Shanon Ace, PA-C Department of Hematology/Oncology [facility] at [facility] Phone: [phone]  [phone]    01/14/2022 5:00 PM

## 2022-01-15 ENCOUNTER — Ambulatory Visit
Admission: RE | Admit: 2022-01-15 | Discharge: 2022-01-15 | Disposition: A | Discharge status: Home / Self Care | Payer: Medicare HMO | Source: Ambulatory Visit | Attending: Radiation Oncology | Admitting: Radiation Oncology

## 2022-01-15 ENCOUNTER — Other Ambulatory Visit: Payer: Self-pay

## 2022-01-15 DIAGNOSIS — R052 Subacute cough: Secondary | ICD-10-CM | POA: Diagnosis not present

## 2022-01-15 DIAGNOSIS — R197 Diarrhea, unspecified: Secondary | ICD-10-CM | POA: Diagnosis not present

## 2022-01-15 DIAGNOSIS — Z87891 Personal history of nicotine dependence: Secondary | ICD-10-CM | POA: Diagnosis not present

## 2022-01-15 DIAGNOSIS — Z5111 Encounter for antineoplastic chemotherapy: Secondary | ICD-10-CM | POA: Diagnosis not present

## 2022-01-15 DIAGNOSIS — Z79899 Other long term (current) drug therapy: Secondary | ICD-10-CM | POA: Diagnosis not present

## 2022-01-15 DIAGNOSIS — E860 Dehydration: Secondary | ICD-10-CM | POA: Diagnosis not present

## 2022-01-15 DIAGNOSIS — C3431 Malignant neoplasm of lower lobe, right bronchus or lung: Secondary | ICD-10-CM | POA: Diagnosis not present

## 2022-01-15 DIAGNOSIS — Z510 Encounter for antineoplastic radiation therapy: Secondary | ICD-10-CM | POA: Diagnosis not present

## 2022-01-15 LAB — RAD ONC ARIA SESSION SUMMARY
Course Elapsed Days: 21
Plan Fractions Treated to Date: 2
Plan Prescribed Dose Per Fraction: 2 Gy
Plan Total Fractions Prescribed: 19
Plan Total Prescribed Dose: 38 Gy
Reference Point Dosage Given to Date: 32 Gy
Reference Point Session Dosage Given: 2 Gy
Session Number: 16

## 2022-01-16 ENCOUNTER — Other Ambulatory Visit: Payer: Self-pay

## 2022-01-16 ENCOUNTER — Ambulatory Visit
Admission: RE | Admit: 2022-01-16 | Discharge: 2022-01-16 | Disposition: A | Discharge status: Home / Self Care | Payer: Medicare HMO | Source: Ambulatory Visit | Attending: Radiation Oncology | Admitting: Radiation Oncology

## 2022-01-16 DIAGNOSIS — E860 Dehydration: Secondary | ICD-10-CM | POA: Diagnosis not present

## 2022-01-16 DIAGNOSIS — R197 Diarrhea, unspecified: Secondary | ICD-10-CM | POA: Diagnosis not present

## 2022-01-16 DIAGNOSIS — Z87891 Personal history of nicotine dependence: Secondary | ICD-10-CM | POA: Diagnosis not present

## 2022-01-16 DIAGNOSIS — Z510 Encounter for antineoplastic radiation therapy: Secondary | ICD-10-CM | POA: Diagnosis not present

## 2022-01-16 DIAGNOSIS — R052 Subacute cough: Secondary | ICD-10-CM | POA: Diagnosis not present

## 2022-01-16 DIAGNOSIS — C3431 Malignant neoplasm of lower lobe, right bronchus or lung: Secondary | ICD-10-CM | POA: Diagnosis not present

## 2022-01-16 DIAGNOSIS — Z5111 Encounter for antineoplastic chemotherapy: Secondary | ICD-10-CM | POA: Diagnosis not present

## 2022-01-16 DIAGNOSIS — Z79899 Other long term (current) drug therapy: Secondary | ICD-10-CM | POA: Diagnosis not present

## 2022-01-16 LAB — RAD ONC ARIA SESSION SUMMARY
Course Elapsed Days: 22
Plan Fractions Treated to Date: 3
Plan Prescribed Dose Per Fraction: 2 Gy
Plan Total Fractions Prescribed: 19
Plan Total Prescribed Dose: 38 Gy
Reference Point Dosage Given to Date: 34 Gy
Reference Point Session Dosage Given: 2 Gy
Session Number: 17

## 2022-01-17 ENCOUNTER — Ambulatory Visit
Admission: RE | Admit: 2022-01-17 | Discharge: 2022-01-17 | Disposition: A | Discharge status: Home / Self Care | Payer: Medicare HMO | Source: Ambulatory Visit | Attending: Radiation Oncology | Admitting: Radiation Oncology

## 2022-01-17 ENCOUNTER — Other Ambulatory Visit: Payer: Self-pay

## 2022-01-17 DIAGNOSIS — R052 Subacute cough: Secondary | ICD-10-CM | POA: Diagnosis not present

## 2022-01-17 DIAGNOSIS — Z87891 Personal history of nicotine dependence: Secondary | ICD-10-CM | POA: Diagnosis not present

## 2022-01-17 DIAGNOSIS — C3431 Malignant neoplasm of lower lobe, right bronchus or lung: Secondary | ICD-10-CM | POA: Diagnosis not present

## 2022-01-17 DIAGNOSIS — R197 Diarrhea, unspecified: Secondary | ICD-10-CM | POA: Diagnosis not present

## 2022-01-17 DIAGNOSIS — Z510 Encounter for antineoplastic radiation therapy: Secondary | ICD-10-CM | POA: Diagnosis not present

## 2022-01-17 DIAGNOSIS — Z79899 Other long term (current) drug therapy: Secondary | ICD-10-CM | POA: Diagnosis not present

## 2022-01-17 DIAGNOSIS — Z5111 Encounter for antineoplastic chemotherapy: Secondary | ICD-10-CM | POA: Diagnosis not present

## 2022-01-17 DIAGNOSIS — E860 Dehydration: Secondary | ICD-10-CM | POA: Diagnosis not present

## 2022-01-17 LAB — RAD ONC ARIA SESSION SUMMARY
Course Elapsed Days: 23
Plan Fractions Treated to Date: 4
Plan Prescribed Dose Per Fraction: 2 Gy
Plan Total Fractions Prescribed: 19
Plan Total Prescribed Dose: 38 Gy
Reference Point Dosage Given to Date: 36 Gy
Reference Point Session Dosage Given: 2 Gy
Session Number: 18

## 2022-01-18 ENCOUNTER — Ambulatory Visit
Admission: RE | Admit: 2022-01-18 | Discharge: 2022-01-18 | Disposition: A | Discharge status: Home / Self Care | Payer: Medicare HMO | Source: Ambulatory Visit | Attending: Radiation Oncology | Admitting: Radiation Oncology

## 2022-01-18 ENCOUNTER — Other Ambulatory Visit: Payer: Self-pay

## 2022-01-18 DIAGNOSIS — E860 Dehydration: Secondary | ICD-10-CM | POA: Diagnosis not present

## 2022-01-18 DIAGNOSIS — R052 Subacute cough: Secondary | ICD-10-CM | POA: Diagnosis not present

## 2022-01-18 DIAGNOSIS — Z79899 Other long term (current) drug therapy: Secondary | ICD-10-CM | POA: Diagnosis not present

## 2022-01-18 DIAGNOSIS — C3431 Malignant neoplasm of lower lobe, right bronchus or lung: Secondary | ICD-10-CM | POA: Diagnosis not present

## 2022-01-18 DIAGNOSIS — Z510 Encounter for antineoplastic radiation therapy: Secondary | ICD-10-CM | POA: Diagnosis not present

## 2022-01-18 DIAGNOSIS — Z5111 Encounter for antineoplastic chemotherapy: Secondary | ICD-10-CM | POA: Diagnosis not present

## 2022-01-18 DIAGNOSIS — R197 Diarrhea, unspecified: Secondary | ICD-10-CM | POA: Diagnosis not present

## 2022-01-18 DIAGNOSIS — Z87891 Personal history of nicotine dependence: Secondary | ICD-10-CM | POA: Diagnosis not present

## 2022-01-18 LAB — RAD ONC ARIA SESSION SUMMARY
Course Elapsed Days: 24
Plan Fractions Treated to Date: 1
Plan Prescribed Dose Per Fraction: 2 Gy
Plan Total Fractions Prescribed: 15
Plan Total Prescribed Dose: 30 Gy
Reference Point Dosage Given to Date: 38 Gy
Reference Point Session Dosage Given: 2 Gy
Session Number: 19

## 2022-01-18 MED FILL — Dexamethasone Sodium Phosphate Inj 100 MG/10ML: INTRAMUSCULAR | Qty: 1 | Status: AC

## 2022-01-21 ENCOUNTER — Encounter: Payer: Self-pay | Admitting: Internal Medicine

## 2022-01-21 ENCOUNTER — Ambulatory Visit
Admission: RE | Admit: 2022-01-21 | Discharge: 2022-01-21 | Disposition: A | Discharge status: Home / Self Care | Payer: Medicare HMO | Source: Ambulatory Visit | Attending: Radiation Oncology | Admitting: Radiation Oncology

## 2022-01-21 ENCOUNTER — Inpatient Hospital Stay: Payer: Medicare HMO

## 2022-01-21 ENCOUNTER — Inpatient Hospital Stay: Payer: Medicare HMO | Admitting: Internal Medicine

## 2022-01-21 ENCOUNTER — Other Ambulatory Visit: Payer: Self-pay

## 2022-01-21 VITALS — BP 114/60 | HR 106 | Temp 98.7°F | Resp 18 | Ht 70.0 in | Wt 197.2 lb

## 2022-01-21 VITALS — BP 116/59 | HR 98 | Resp 19

## 2022-01-21 DIAGNOSIS — R059 Cough, unspecified: Secondary | ICD-10-CM | POA: Insufficient documentation

## 2022-01-21 DIAGNOSIS — Z79899 Other long term (current) drug therapy: Secondary | ICD-10-CM | POA: Insufficient documentation

## 2022-01-21 DIAGNOSIS — Z5111 Encounter for antineoplastic chemotherapy: Secondary | ICD-10-CM

## 2022-01-21 DIAGNOSIS — C3491 Malignant neoplasm of unspecified part of right bronchus or lung: Secondary | ICD-10-CM

## 2022-01-21 DIAGNOSIS — C3431 Malignant neoplasm of lower lobe, right bronchus or lung: Secondary | ICD-10-CM | POA: Insufficient documentation

## 2022-01-21 DIAGNOSIS — E860 Dehydration: Secondary | ICD-10-CM | POA: Insufficient documentation

## 2022-01-21 LAB — CMP (CANCER CENTER ONLY)
ALT: 28 U/L (ref 0–44)
AST: 31 U/L (ref 15–41)
Albumin: 2.6 g/dL — ABNORMAL LOW (ref 3.5–5.0)
Alkaline Phosphatase: 60 U/L (ref 38–126)
Anion gap: 5 (ref 5–15)
BUN: 10 mg/dL (ref 8–23)
CO2: 26 mmol/L (ref 22–32)
Calcium: 8.7 mg/dL — ABNORMAL LOW (ref 8.9–10.3)
Chloride: 99 mmol/L (ref 98–111)
Creatinine: 0.65 mg/dL (ref 0.61–1.24)
GFR, Estimated: >60 mL/min (ref >60)
Glucose, Bld: 173 mg/dL — ABNORMAL HIGH (ref 70–99)
Potassium: 4 mmol/L (ref 3.5–5.1)
Sodium: 130 mmol/L — ABNORMAL LOW (ref 135–145)
Total Bilirubin: 0.5 mg/dL (ref 0.3–1.2)
Total Protein: 6 g/dL — ABNORMAL LOW (ref 6.5–8.1)

## 2022-01-21 LAB — RAD ONC ARIA SESSION SUMMARY
Course Elapsed Days: 27
Plan Fractions Treated to Date: 2
Plan Prescribed Dose Per Fraction: 2 Gy
Plan Total Fractions Prescribed: 15
Plan Total Prescribed Dose: 30 Gy
Reference Point Dosage Given to Date: 40 Gy
Reference Point Session Dosage Given: 2 Gy
Session Number: 20

## 2022-01-21 LAB — CBC WITH DIFFERENTIAL (CANCER CENTER ONLY)
Abs Immature Granulocytes: 0.04 10*3/uL (ref 0.00–0.07)
Basophils Absolute: 0 10*3/uL (ref 0.0–0.1)
Basophils Relative: 1 %
Eosinophils Absolute: 0 10*3/uL (ref 0.0–0.5)
Eosinophils Relative: 1 %
HCT: 27.9 % — ABNORMAL LOW (ref 39.0–52.0)
Hemoglobin: 9.6 g/dL — ABNORMAL LOW (ref 13.0–17.0)
Immature Granulocytes: 1 %
Lymphocytes Relative: 4 %
Lymphs Abs: 0.2 10*3/uL — ABNORMAL LOW (ref 0.7–4.0)
MCH: 27.4 pg (ref 26.0–34.0)
MCHC: 34.4 g/dL (ref 30.0–36.0)
MCV: 79.7 fL — ABNORMAL LOW (ref 80.0–100.0)
Monocytes Absolute: 0.3 10*3/uL (ref 0.1–1.0)
Monocytes Relative: 8 %
Neutro Abs: 3.5 10*3/uL (ref 1.7–7.7)
Neutrophils Relative %: 85 %
Platelet Count: 186 10*3/uL (ref 150–400)
RBC: 3.5 MIL/uL — ABNORMAL LOW (ref 4.22–5.81)
RDW: 15.4 % (ref 11.5–15.5)
WBC Count: 4 10*3/uL (ref 4.0–10.5)
nRBC: 0 % (ref 0.0–0.2)

## 2022-01-21 MED ORDER — FAMOTIDINE IN NACL 20-0.9 MG/50ML-% IV SOLN
20.0000 mg | Freq: Once | INTRAVENOUS | Status: AC
  Administered 2022-01-21: 20 mg via INTRAVENOUS
  Filled 2022-01-21: qty 50

## 2022-01-21 MED ORDER — SODIUM CHLORIDE 0.9 % IV SOLN
10.0000 mg | Freq: Once | INTRAVENOUS | Status: AC
  Administered 2022-01-21: 10 mg via INTRAVENOUS
  Filled 2022-01-21: qty 10

## 2022-01-21 MED ORDER — SODIUM CHLORIDE 0.9 % IV SOLN: Freq: Once | INTRAVENOUS | Status: AC

## 2022-01-21 MED ORDER — PALONOSETRON HCL INJECTION 0.25 MG/5ML
0.2500 mg | Freq: Once | INTRAVENOUS | Status: AC
  Administered 2022-01-21: 0.25 mg via INTRAVENOUS
  Filled 2022-01-21: qty 5

## 2022-01-21 MED ORDER — SODIUM CHLORIDE 0.9 % IV SOLN
223.8000 mg | Freq: Once | INTRAVENOUS | Status: AC
  Administered 2022-01-21: 220 mg via INTRAVENOUS
  Filled 2022-01-21: qty 22

## 2022-01-21 MED ORDER — SODIUM CHLORIDE 0.9 % IV SOLN
45.0000 mg/m2 | Freq: Once | INTRAVENOUS | Status: AC
  Administered 2022-01-21: 96 mg via INTRAVENOUS
  Filled 2022-01-21: qty 16

## 2022-01-21 MED ORDER — DIPHENHYDRAMINE HCL 50 MG/ML IJ SOLN
50.0000 mg | Freq: Once | INTRAMUSCULAR | Status: AC
  Administered 2022-01-21: 50 mg via INTRAVENOUS
  Filled 2022-01-21: qty 1

## 2022-01-21 NOTE — Progress Notes (Signed)
[facility] [phone]   [phone]  OFFICE PROGRESS NOTE  [name], MD [place] [address]  DIAGNOSIS:  Stage IIIb (T4, N0/N2, M0) non-small cell lung cancer, squamous cell carcinoma presented with large right lower lobe lung mass in addition to satellite nodules close to the mass and suspicious mediastinal lymphadenopathy diagnosed in May 2023  PD-L1 expression 1%  PRIOR THERAPY: None  CURRENT THERAPY: A course of concurrent chemoradiation with weekly carboplatin for AUC of 2 and paclitaxel 45 Mg/M2.  First dose of chemotherapy starts 12/25/2021.  Status post 3 cycles.  INTERVAL HISTORY: Jorge Adams 71 y.o. male returns to the clinic today for follow-up visit accompanied by his wife.  The patient continues to complain of increasing fatigue and weakness as well as lack of appetite.  He drinks protein milkshakes and no significant weight loss.  He also continues to complain of cough but she felt much better after treatment with Levaquin last week.  He denied having any nausea, vomiting but has occasional diarrhea with no constipation.  He has no headache or visual changes.  He has mild swelling of the ankles bilaterally but this is likely secondary to hypoalbuminemia.  He continues to tolerate his treatment with concurrent chemoradiation fairly well.  He is here today for evaluation before starting cycle #4 of his treatment.  MEDICAL HISTORY: Past Medical History:  Diagnosis Date   Arthritis    ASHD (arteriosclerotic heart disease)    Atherosclerosis of native arteries of the extremities with intermittent claudication    Cataract    Coronary atherosclerosis of native coronary artery    Diabetes mellitus    NIDDM   Dyslipidemia    ED (erectile dysfunction)    Full dentures    Glaucoma associated with ocular disorder, mild stage    bilateral   Hyperlipidemia    Hypertension    under control, has been on  med. > 9 yr.   Impotence of organic origin    Meniscus tear 05/2012   right knee   Myocardial infarction [facility]) 10/2002   Stented coronary artery     ALLERGIES:  has No Known Allergies.  MEDICATIONS:  Current Outpatient Medications  Medication Sig Dispense Refill   albuterol (VENTOLIN HFA) 108 (90 Base) MCG/ACT inhaler Inhale 1-2 puffs into the lungs every 6 (six) hours as needed (for cough.  okay to fill with albuterol/proair/ventolin.). 8 g 2   amLODipine-benazepril (LOTREL) 5-20 MG capsule TAKE 1 CAPSULE BY MOUTH EVERY DAY 90 capsule 3   aspirin EC 81 MG tablet Take 1 tablet (81 mg total) by mouth daily. Okay to restart this medication on 12/05/2021. 30 tablet 11   benzonatate (TESSALON) 200 MG capsule Take 1 capsule (200 mg total) by mouth 3 (three) times daily as needed. 30 capsule 3   blood glucose meter kit and supplies Dispense based on pt and insurance preference. Use up to 3 times daily as directed. (Dx. E11.9). 1 each 12   Blood Pressure Monitoring (SPHYGMOMANOMETER) MISC Use daily to check BP.  Dx I10 1 each 0   brimonidine (ALPHAGAN P) 0.1 % SOLN Place 1 drop into both eyes 2 (two) times daily.     cilostazol (PLETAL) 100 MG tablet Take 1 tablet (100 mg total) by mouth 2 (two) times daily. Okay to restart this medication on 12/05/2021. 180 tablet 1   dorzolamide-timolol (COSOPT) 22.3-6.8 MG/ML ophthalmic solution Place 1 drop into both eyes  2 (two) times daily.     fenofibrate (TRICOR) 145 MG tablet TAKE 1 TABLET BY MOUTH EVERY DAY IN THE EVENING 90 tablet 1   HYDROcodone bit-homatropine (HYCODAN) 5-1.5 MG/5ML syrup Take 5 mLs by mouth every 8 (eight) hours as needed for cough (sedation caution.). 120 mL 0   metFORMIN (GLUCOPHAGE) 500 MG tablet Take 1 tablet (500 mg total) by mouth 2 (two) times daily with a meal.     metoprolol tartrate (LOPRESSOR) 25 MG tablet TAKE 1 TABLET BY MOUTH TWICE A DAY 180 tablet 4   Netarsudil-Latanoprost (ROCKLATAN) 0.02-0.005 % SOLN Apply 1 drop to  eye at bedtime. In both eyes     nitroGLYCERIN (NITROSTAT) 0.4 MG SL tablet Place 1 tablet (0.4 mg total) under the tongue every 5 (five) minutes as needed for chest pain. (Patient not taking: Reported on 12/25/2021) 25 tablet 5   omega-3 acid ethyl esters (LOVAZA) 1 g capsule TAKE 1 CAPSULE BY MOUTH TWICE A DAY 180 capsule 1   prochlorperazine (COMPAZINE) 10 MG tablet Take 1 tablet (10 mg total) by mouth every 6 (six) hours as needed for nausea or vomiting. (Patient not taking: Reported on 12/25/2021) 30 tablet 0   rosuvastatin (CRESTOR) 20 MG tablet TAKE 1 TABLET BY MOUTH EVERY DAY 90 tablet 0   No current facility-administered medications for this visit.    SURGICAL HISTORY:  Past Surgical History:  Procedure Laterality Date   ANTERIOR CERVICAL DECOMP/DISCECTOMY FUSION N/A 05/07/2018   Procedure: ANTERIOR CERVICAL DECOMPRESSION/DISCECTOMY FUSION CERVICAL FIVE - CERVICAL SIX;  Surgeon: Consuella Lose, MD;  Location: [facility];  Service: Neurosurgery;  Laterality: N/A;  ANTERIOR CERVICAL DECOMPRESSION/DISCECTOMY FUSION CERVICAL FIVE - CERVICAL SIX   BRONCHIAL BIOPSY  12/04/2021   Procedure: BRONCHIAL BIOPSIES;  Surgeon: Collene Gobble, MD;  Location: [facility];  Service: Cardiopulmonary;;   BRONCHIAL BRUSHINGS  12/04/2021   Procedure: BRONCHIAL BRUSHINGS;  Surgeon: Collene Gobble, MD;  Location: [facility];  Service: Cardiopulmonary;;   BRONCHIAL NEEDLE ASPIRATION BIOPSY  12/04/2021   Procedure: BRONCHIAL NEEDLE ASPIRATION BIOPSIES;  Surgeon: Collene Gobble, MD;  Location: [facility];  Service: Cardiopulmonary;;   CARDIAC CATHETERIZATION  10/23/2002   had 1 stent placed then-- by Dr. Melvern Banker   CARDIAC CATHETERIZATION N/A 12/21/2015   Procedure: Left Heart Cath and Coronary Angiography;  Surgeon: Adrian Prows, MD;  Location: [facility] CV LAB;  Service: Cardiovascular;  Laterality: N/A;   CATARACT EXTRACTION Right 05/2017   CATARACT EXTRACTION Left 07/2017   COLONOSCOPY  10/08/2010    Dr.Patterson   CORONARY ARTERY BYPASS GRAFT N/A 01/02/2016   Procedure: CORONARY ARTERY BYPASS GRAFTING (CABG) TIMES 3 USING LEFT INTERNAL MAMMARY ARTERY AND RIGHT SAPHENOUS LEG VEIN HARVESTED ENDOSCOPICALLY;  Surgeon: Ivin Poot, MD;  Location: [facility];  Service: Open Heart Surgery;  Laterality: N/A;   EYE SURGERY     INSERTION OF MESH N/A 05/06/2016   Procedure: INSERTION OF MESH;  Surgeon: Georganna Skeans, MD;  Location: [facility];  Service: General;  Laterality: N/A;   KNEE ARTHROSCOPY WITH MEDIAL MENISECTOMY  06/04/2012   Procedure: KNEE ARTHROSCOPY WITH MEDIAL MENISECTOMY;  Surgeon: Ninetta Lights, MD;  Location: [facility];  Service: Orthopedics;  Laterality: Right;  RIGHT SCOPE MEDIAL MENISCECTOMY, CHONDROPLASTY, EXCISION LOOSE BODY   LOWER EXTREMITY ANGIOGRAM N/A 11/23/2013   Procedure: LOWER EXTREMITY ANGIOGRAM;  Surgeon: Laverda Page, MD;  Location[facility] C[facility] LAB;  Service: Cardiovascular;  Laterality: N/A;   NECK SURGERY N/A    02/19/2018  PERIPHERAL VASCULAR CATHETERIZATION N/A 12/21/2015   Procedure: Abdominal Aortogram;  Surgeon: Adrian Prows, MD;  Location: [facility] CV LAB;  Service: Cardiovascular;  Laterality: N/A;   PERIPHERAL VASCULAR CATHETERIZATION N/A 12/21/2015   Procedure: Abdominal Aortogram w/Lower Extremity;  Surgeon: Adrian Prows, MD;  Location: [facility] CV LAB;  Service: Cardiovascular;  Laterality: N/A;   TEE WITHOUT CARDIOVERSION N/A 01/02/2016   Procedure: TRANSESOPHAGEAL ECHOCARDIOGRAM (TEE);  Surgeon: Ivin Poot, MD;  Location: [facility];  Service: Open Heart Surgery;  Laterality: N/A;   UMBILICAL HERNIA REPAIR N/A 05/06/2016   Procedure: HERNIA REPAIR UMBILICAL ADULT;  Surgeon: Georganna Skeans, MD;  Location: [facility];  Service: General;  Laterality: N/A;   VIDEO BRONCHOSCOPY N/A 12/04/2021   Procedure: VIDEO BRONCHOSCOPY WITH FLUORO;  Surgeon: Collene Gobble, MD;  Location: [facility];  Service: Cardiopulmonary;  Laterality: N/A;    REVIEW  OF SYSTEMS:  A comprehensive review of systems was negative except for: Constitutional: positive for fatigue Respiratory: positive for cough Gastrointestinal: positive for diarrhea Musculoskeletal: positive for muscle weakness   PHYSICAL EXAMINATION: General appearance: alert, cooperative, fatigued, and no distress Head: Normocephalic, without obvious abnormality, atraumatic Neck: no adenopathy, no JVD, supple, symmetrical, trachea midline, and thyroid not enlarged, symmetric, no tenderness/mass/nodules Lymph nodes: Cervical, supraclavicular, and axillary nodes normal. Resp: clear to auscultation bilaterally Back: symmetric, no curvature. ROM normal. No CVA tenderness. Cardio: regular rate and rhythm, S1, S2 normal, no murmur, click, rub or gallop GI: soft, non-tender; bowel sounds normal; no masses,  no organomegaly Extremities: extremities normal, atraumatic, no cyanosis or edema  ECOG PERFORMANCE STATUS: 1 - Symptomatic but completely ambulatory  Blood pressure 114/60, pulse (!) 106, temperature 98.7 F (37.1 C), temperature source Oral, resp. rate 18, height 5' 10"  (1.778 m), weight 197 lb 3.2 oz (89.4 kg), SpO2 96 %.  LABORATORY DATA: Lab Results  Component Value Date   WBC 4.0 01/21/2022   HGB 9.6 (L) 01/21/2022   HCT 27.9 (L) 01/21/2022   MCV 79.7 (L) 01/21/2022   PLT 186 01/21/2022      Chemistry      Component Value Date/Time   NA 130 (L) 01/21/2022 0834   NA 143 04/10/2018 1157   K 4.0 01/21/2022 0834   CL 99 01/21/2022 0834   CO2 26 01/21/2022 0834   BUN 10 01/21/2022 0834   BUN 8 04/10/2018 1157   CREATININE 0.65 01/21/2022 0834   CREATININE 0.78 05/02/2017 0000   CREATININE 0.71 10/26/2012 0903      Component Value Date/Time   CALCIUM 8.7 (L) 01/21/2022 0834   ALKPHOS 60 01/21/2022 0834   AST 31 01/21/2022 0834   ALT 28 01/21/2022 0834   BILITOT 0.5 01/21/2022 0834       RADIOGRAPHIC STUDIES: DG Chest 2 View  Result Date: 01/14/2022 CLINICAL DATA:   Cough, fever EXAM: CHEST - 2 VIEW COMPARISON:  Previous studies including the examination done on 11/06/2021 FINDINGS: Cardiac size is within normal limits. There is evidence of previous coronary bypass surgery. There are no signs of alveolar pulmonary edema. There is a large mass lesion with air fluid level in the posterior right mid to lower lung fields in the right lower lobe measuring proximally 9.4 x 9.2 by 11 cm. There is significant interval increase in size. Rest of the lung fields are essentially clear. There is no pleural effusion or pneumothorax. IMPRESSION: There is a large mass lesion measuring 11 cm in maximum diameter in the posterior right mid and right lower lung  fields with interval increase in size. Findings suggest malignant neoplasm in the right lower lobe with interval progression. There is air-fluid level within the lesion suggesting possible necrosis in the neoplasm or suggest superimposed infectious process. Electronically Signed   By: Elmer Picker M.D.   On: 01/14/2022 14:07    ASSESSMENT AND PLAN: This is a very pleasant 71 years old white male with Stage IIIb (T4, N0/N2, M0) non-small cell lung cancer, squamous cell carcinoma presented with large right lower lobe lung mass in addition to satellite nodules close to the mass and suspicious mediastinal lymphadenopathy diagnosed in May 2023. The patient is currently undergoing a course of concurrent chemoradiation with weekly carboplatin for AUC of 2 and paclitaxel 45 Mg/M2 status post 3 cycles.  The patient has been tolerating this treatment well with no concerning adverse effect except for fatigue. I recommended for him to proceed with cycle #4 today as planned. For the cough, I will give him refill of Hycodan and Tessalon. I will see him back for follow-up visit in 2 weeks for evaluation before the last cycle of his treatment. The patient was advised to call immediately if he has any other concerning symptoms in the  interval. The patient voices understanding of current disease status and treatment options and is in agreement with the current care plan.  All questions were answered. The patient knows to call the clinic with any problems, questions or concerns. We can certainly see the patient much sooner if necessary.  The total time spent in the appointment was 20 minutes.  Disclaimer: This note was dictated with voice recognition software. Similar sounding words can inadvertently be transcribed and may not be corrected upon review.

## 2022-01-21 NOTE — Patient Instructions (Signed)
[facility] ONCOLOGY  Discharge Instructions: Thank you for choosing [facility] to provide your oncology and hematology care.   If you have a lab appointment with the [facility], please go directly to the [facility] and check in at the registration area.   Wear comfortable clothing and clothing appropriate for easy access to any Portacath or PICC line.   We strive to give you quality time with your provider. You may need to reschedule your appointment if you arrive late (15 or more minutes).  Arriving late affects you and other patients whose appointments are after yours.  Also, if you miss three or more appointments without notifying the office, you may be dismissed from the clinic at the provider's discretion.      For prescription refill requests, have your pharmacy contact our office and allow 72 hours for refills to be completed.    Today you received the following chemotherapy and/or immunotherapy agents: Paclitaxel and Carboplatin      To help prevent nausea and vomiting after your treatment, we encourage you to take your nausea medication as directed.  BELOW ARE SYMPTOMS THAT SHOULD BE REPORTED IMMEDIATELY: *FEVER GREATER THAN 100.4 F (38 C) OR HIGHER *CHILLS OR SWEATING *NAUSEA AND VOMITING THAT IS NOT CONTROLLED WITH YOUR NAUSEA MEDICATION *UNUSUAL SHORTNESS OF BREATH *UNUSUAL BRUISING OR BLEEDING *URINARY PROBLEMS (pain or burning when urinating, or frequent urination) *BOWEL PROBLEMS (unusual diarrhea, constipation, pain near the anus) TENDERNESS IN MOUTH AND THROAT WITH OR WITHOUT PRESENCE OF ULCERS (sore throat, sores in mouth, or a toothache) UNUSUAL RASH, SWELLING OR PAIN  UNUSUAL VAGINAL DISCHARGE OR ITCHING   Items with * indicate a potential emergency and should be followed up as soon as possible or go to the Emergency Department if any problems should occur.  Please show the CHEMOTHERAPY ALERT CARD or IMMUNOTHERAPY ALERT  CARD at check-in to the Emergency Department and triage nurse.  Should you have questions after your visit or need to cancel or reschedule your appointment, please contact [facility]  Dept: [phone]  and follow the prompts.  Office hours are 8:00 a.m. to 4:30 p.m. Monday - Friday. Please note that voicemails left after 4:00 p.m. may not be returned until the following business day.  We are closed weekends and major holidays. You have access to a nurse at all times for urgent questions. Please call the main number to the clinic Dept: [phone] and follow the prompts.   For any non-urgent questions, you may also contact your provider using MyChart. We now offer e-Visits for anyone 44 and older to request care online for non-urgent symptoms. For details visit [email or web address].   Also download the MyChart app! Go to the app store, search "MyChart", open the app, select [facility], and log in with your MyChart username and password.  Masks are optional in the cancer centers. If you would like for your care team to wear a mask while they are taking care of you, please let them know. For doctor visits, patients may have with them one support person who is at least 73 years old. At this time, visitors are not allowed in the infusion area.

## 2022-01-23 ENCOUNTER — Ambulatory Visit
Admission: RE | Admit: 2022-01-23 | Discharge: 2022-01-23 | Disposition: A | Discharge status: Home / Self Care | Payer: Medicare HMO | Source: Ambulatory Visit | Attending: Radiation Oncology | Admitting: Radiation Oncology

## 2022-01-23 ENCOUNTER — Other Ambulatory Visit: Payer: Self-pay

## 2022-01-23 DIAGNOSIS — Z87891 Personal history of nicotine dependence: Secondary | ICD-10-CM | POA: Diagnosis not present

## 2022-01-23 DIAGNOSIS — C3491 Malignant neoplasm of unspecified part of right bronchus or lung: Secondary | ICD-10-CM | POA: Diagnosis not present

## 2022-01-23 DIAGNOSIS — C3431 Malignant neoplasm of lower lobe, right bronchus or lung: Secondary | ICD-10-CM | POA: Diagnosis not present

## 2022-01-23 DIAGNOSIS — Z510 Encounter for antineoplastic radiation therapy: Secondary | ICD-10-CM | POA: Diagnosis not present

## 2022-01-23 DIAGNOSIS — R059 Cough, unspecified: Secondary | ICD-10-CM | POA: Diagnosis not present

## 2022-01-23 LAB — RAD ONC ARIA SESSION SUMMARY
Course Elapsed Days: 29
Plan Fractions Treated to Date: 3
Plan Prescribed Dose Per Fraction: 2 Gy
Plan Total Fractions Prescribed: 15
Plan Total Prescribed Dose: 30 Gy
Reference Point Dosage Given to Date: 42 Gy
Reference Point Session Dosage Given: 2 Gy
Session Number: 21

## 2022-01-24 ENCOUNTER — Ambulatory Visit
Admission: RE | Admit: 2022-01-24 | Discharge: 2022-01-24 | Disposition: A | Discharge status: Home / Self Care | Payer: Medicare HMO | Source: Ambulatory Visit | Attending: Radiation Oncology | Admitting: Radiation Oncology

## 2022-01-24 ENCOUNTER — Other Ambulatory Visit: Payer: Self-pay

## 2022-01-24 DIAGNOSIS — R059 Cough, unspecified: Secondary | ICD-10-CM | POA: Diagnosis not present

## 2022-01-24 DIAGNOSIS — C3491 Malignant neoplasm of unspecified part of right bronchus or lung: Secondary | ICD-10-CM | POA: Diagnosis not present

## 2022-01-24 LAB — RAD ONC ARIA SESSION SUMMARY
Course Elapsed Days: 30
Plan Fractions Treated to Date: 4
Plan Prescribed Dose Per Fraction: 2 Gy
Plan Total Fractions Prescribed: 15
Plan Total Prescribed Dose: 30 Gy
Reference Point Dosage Given to Date: 44 Gy
Reference Point Session Dosage Given: 2 Gy
Session Number: 22

## 2022-01-25 ENCOUNTER — Other Ambulatory Visit: Payer: Self-pay

## 2022-01-25 ENCOUNTER — Ambulatory Visit
Admission: RE | Admit: 2022-01-25 | Discharge: 2022-01-25 | Disposition: A | Discharge status: Home / Self Care | Payer: Medicare HMO | Source: Ambulatory Visit | Attending: Radiation Oncology | Admitting: Radiation Oncology

## 2022-01-25 DIAGNOSIS — R059 Cough, unspecified: Secondary | ICD-10-CM | POA: Diagnosis not present

## 2022-01-25 DIAGNOSIS — C3491 Malignant neoplasm of unspecified part of right bronchus or lung: Secondary | ICD-10-CM | POA: Diagnosis not present

## 2022-01-25 LAB — RAD ONC ARIA SESSION SUMMARY
Course Elapsed Days: 31
Plan Fractions Treated to Date: 5
Plan Prescribed Dose Per Fraction: 2 Gy
Plan Total Fractions Prescribed: 15
Plan Total Prescribed Dose: 30 Gy
Reference Point Dosage Given to Date: 46 Gy
Reference Point Session Dosage Given: 2 Gy
Session Number: 23

## 2022-01-25 MED ORDER — SONAFINE EX EMUL
1.0000 | Freq: Once | CUTANEOUS | Status: AC
  Administered 2022-01-25: 1 via TOPICAL

## 2022-01-25 MED FILL — Dexamethasone Sodium Phosphate Inj 100 MG/10ML: INTRAMUSCULAR | Qty: 1 | Status: AC

## 2022-01-28 ENCOUNTER — Inpatient Hospital Stay: Payer: Medicare HMO

## 2022-01-28 ENCOUNTER — Telehealth: Payer: Self-pay

## 2022-01-28 ENCOUNTER — Other Ambulatory Visit: Payer: Self-pay

## 2022-01-28 ENCOUNTER — Other Ambulatory Visit: Payer: Self-pay | Admitting: Internal Medicine

## 2022-01-28 ENCOUNTER — Ambulatory Visit
Admission: RE | Admit: 2022-01-28 | Discharge: 2022-01-28 | Disposition: A | Discharge status: Home / Self Care | Payer: Medicare HMO | Source: Ambulatory Visit | Attending: Radiation Oncology | Admitting: Radiation Oncology

## 2022-01-28 VITALS — BP 114/70 | HR 90 | Temp 98.1°F | Resp 18

## 2022-01-28 DIAGNOSIS — C3491 Malignant neoplasm of unspecified part of right bronchus or lung: Secondary | ICD-10-CM | POA: Diagnosis not present

## 2022-01-28 DIAGNOSIS — R059 Cough, unspecified: Secondary | ICD-10-CM | POA: Diagnosis not present

## 2022-01-28 LAB — CMP (CANCER CENTER ONLY)
ALT: 19 U/L (ref 0–44)
AST: 21 U/L (ref 15–41)
Albumin: 2.8 g/dL — ABNORMAL LOW (ref 3.5–5.0)
Alkaline Phosphatase: 53 U/L (ref 38–126)
Anion gap: 6 (ref 5–15)
BUN: 13 mg/dL (ref 8–23)
CO2: 26 mmol/L (ref 22–32)
Calcium: 9.2 mg/dL (ref 8.9–10.3)
Chloride: 101 mmol/L (ref 98–111)
Creatinine: 0.66 mg/dL (ref 0.61–1.24)
GFR, Estimated: >60 mL/min (ref >60)
Glucose, Bld: 136 mg/dL — ABNORMAL HIGH (ref 70–99)
Potassium: 3.9 mmol/L (ref 3.5–5.1)
Sodium: 133 mmol/L — ABNORMAL LOW (ref 135–145)
Total Bilirubin: 0.4 mg/dL (ref 0.3–1.2)
Total Protein: 6.1 g/dL — ABNORMAL LOW (ref 6.5–8.1)

## 2022-01-28 LAB — RAD ONC ARIA SESSION SUMMARY
Course Elapsed Days: 34
Plan Fractions Treated to Date: 6
Plan Prescribed Dose Per Fraction: 2 Gy
Plan Total Fractions Prescribed: 15
Plan Total Prescribed Dose: 30 Gy
Reference Point Dosage Given to Date: 48 Gy
Reference Point Session Dosage Given: 2 Gy
Session Number: 24

## 2022-01-28 LAB — CBC WITH DIFFERENTIAL (CANCER CENTER ONLY)
Abs Immature Granulocytes: 0.03 10*3/uL (ref 0.00–0.07)
Basophils Absolute: 0 10*3/uL (ref 0.0–0.1)
Basophils Relative: 1 %
Eosinophils Absolute: 0 10*3/uL (ref 0.0–0.5)
Eosinophils Relative: 2 %
HCT: 26.2 % — ABNORMAL LOW (ref 39.0–52.0)
Hemoglobin: 8.9 g/dL — ABNORMAL LOW (ref 13.0–17.0)
Immature Granulocytes: 2 %
Lymphocytes Relative: 19 %
Lymphs Abs: 0.4 10*3/uL — ABNORMAL LOW (ref 0.7–4.0)
MCH: 27.1 pg (ref 26.0–34.0)
MCHC: 34 g/dL (ref 30.0–36.0)
MCV: 79.6 fL — ABNORMAL LOW (ref 80.0–100.0)
Monocytes Absolute: 0.3 10*3/uL (ref 0.1–1.0)
Monocytes Relative: 15 %
Neutro Abs: 1.2 10*3/uL — ABNORMAL LOW (ref 1.7–7.7)
Neutrophils Relative %: 61 %
Platelet Count: 189 10*3/uL (ref 150–400)
RBC: 3.29 MIL/uL — ABNORMAL LOW (ref 4.22–5.81)
RDW: 16 % — ABNORMAL HIGH (ref 11.5–15.5)
WBC Count: 1.9 10*3/uL — ABNORMAL LOW (ref 4.0–10.5)
nRBC: 0 % (ref 0.0–0.2)

## 2022-01-28 MED ORDER — PALONOSETRON HCL INJECTION 0.25 MG/5ML
0.2500 mg | Freq: Once | INTRAVENOUS | Status: AC
  Administered 2022-01-28: 0.25 mg via INTRAVENOUS
  Filled 2022-01-28: qty 5

## 2022-01-28 MED ORDER — SODIUM CHLORIDE 0.9 % IV SOLN: Freq: Once | INTRAVENOUS | Status: AC

## 2022-01-28 MED ORDER — HYDROCODONE BIT-HOMATROP MBR 5-1.5 MG/5ML PO SOLN: 5.0000 mL | Freq: Three times a day (TID) | ORAL | 0 refills | Status: DC | PRN

## 2022-01-28 MED ORDER — DIPHENHYDRAMINE HCL 50 MG/ML IJ SOLN
50.0000 mg | Freq: Once | INTRAMUSCULAR | Status: AC
  Administered 2022-01-28: 50 mg via INTRAVENOUS
  Filled 2022-01-28: qty 1

## 2022-01-28 MED ORDER — FAMOTIDINE IN NACL 20-0.9 MG/50ML-% IV SOLN
20.0000 mg | Freq: Once | INTRAVENOUS | Status: AC
  Administered 2022-01-28: 20 mg via INTRAVENOUS
  Filled 2022-01-28: qty 50

## 2022-01-28 MED ORDER — SODIUM CHLORIDE 0.9 % IV SOLN
45.0000 mg/m2 | Freq: Once | INTRAVENOUS | Status: AC
  Administered 2022-01-28: 96 mg via INTRAVENOUS
  Filled 2022-01-28: qty 16

## 2022-01-28 MED ORDER — SODIUM CHLORIDE 0.9 % IV SOLN
223.8000 mg | Freq: Once | INTRAVENOUS | Status: AC
  Administered 2022-01-28: 220 mg via INTRAVENOUS
  Filled 2022-01-28: qty 22

## 2022-01-28 MED ORDER — SODIUM CHLORIDE 0.9 % IV SOLN
10.0000 mg | Freq: Once | INTRAVENOUS | Status: AC
  Administered 2022-01-28: 10 mg via INTRAVENOUS
  Filled 2022-01-28: qty 10

## 2022-01-28 NOTE — Patient Instructions (Signed)
[facility] ONCOLOGY  Discharge Instructions: Thank you for choosing [facility] to provide your oncology and hematology care.   If you have a lab appointment with the [facility], please go directly to the [facility] and check in at the registration area.   Wear comfortable clothing and clothing appropriate for easy access to any Portacath or PICC line.   We strive to give you quality time with your provider. You may need to reschedule your appointment if you arrive late (15 or more minutes).  Arriving late affects you and other patients whose appointments are after yours.  Also, if you miss three or more appointments without notifying the office, you may be dismissed from the clinic at the provider's discretion.      For prescription refill requests, have your pharmacy contact our office and allow 72 hours for refills to be completed.    Today you received the following chemotherapy and/or immunotherapy agents: Paclitaxel and Carboplatin      To help prevent nausea and vomiting after your treatment, we encourage you to take your nausea medication as directed.  BELOW ARE SYMPTOMS THAT SHOULD BE REPORTED IMMEDIATELY: *FEVER GREATER THAN 100.4 F (38 C) OR HIGHER *CHILLS OR SWEATING *NAUSEA AND VOMITING THAT IS NOT CONTROLLED WITH YOUR NAUSEA MEDICATION *UNUSUAL SHORTNESS OF BREATH *UNUSUAL BRUISING OR BLEEDING *URINARY PROBLEMS (pain or burning when urinating, or frequent urination) *BOWEL PROBLEMS (unusual diarrhea, constipation, pain near the anus) TENDERNESS IN MOUTH AND THROAT WITH OR WITHOUT PRESENCE OF ULCERS (sore throat, sores in mouth, or a toothache) UNUSUAL RASH, SWELLING OR PAIN  UNUSUAL VAGINAL DISCHARGE OR ITCHING   Items with * indicate a potential emergency and should be followed up as soon as possible or go to the Emergency Department if any problems should occur.  Please show the CHEMOTHERAPY ALERT CARD or IMMUNOTHERAPY ALERT  CARD at check-in to the Emergency Department and triage nurse.  Should you have questions after your visit or need to cancel or reschedule your appointment, please contact [facility]  Dept: [phone]  and follow the prompts.  Office hours are 8:00 a.m. to 4:30 p.m. Monday - Friday. Please note that voicemails left after 4:00 p.m. may not be returned until the following business day.  We are closed weekends and major holidays. You have access to a nurse at all times for urgent questions. Please call the main number to the clinic Dept: [phone] and follow the prompts.   For any non-urgent questions, you may also contact your provider using MyChart. We now offer e-Visits for anyone 35 and older to request care online for non-urgent symptoms. For details visit [email or web address].   Also download the MyChart app! Go to the app store, search "MyChart", open the app, select [facility], and log in with your MyChart username and password.  Masks are optional in the cancer centers. If you would like for your care team to wear a mask while they are taking care of you, please let them know. For doctor visits, patients may have with them one support person who is at least 73 years old. At this time, visitors are not allowed in the infusion area.

## 2022-01-28 NOTE — Progress Notes (Signed)
Per Dr. Julien Nordmann ANC is 1.16 today. Ok to treat.

## 2022-01-28 NOTE — Telephone Encounter (Signed)
Patient requesting refill on Hycodan Cough syrup.  Please send to [place], [place]

## 2022-01-29 ENCOUNTER — Other Ambulatory Visit: Payer: Self-pay

## 2022-01-29 ENCOUNTER — Ambulatory Visit
Admission: RE | Admit: 2022-01-29 | Discharge: 2022-01-29 | Disposition: A | Discharge status: Home / Self Care | Payer: Medicare HMO | Source: Ambulatory Visit | Attending: Radiation Oncology | Admitting: Radiation Oncology

## 2022-01-29 ENCOUNTER — Other Ambulatory Visit: Payer: Self-pay | Admitting: Radiation Oncology

## 2022-01-29 DIAGNOSIS — R059 Cough, unspecified: Secondary | ICD-10-CM | POA: Diagnosis not present

## 2022-01-29 DIAGNOSIS — C3491 Malignant neoplasm of unspecified part of right bronchus or lung: Secondary | ICD-10-CM | POA: Diagnosis not present

## 2022-01-29 LAB — RAD ONC ARIA SESSION SUMMARY
Course Elapsed Days: 35
Plan Fractions Treated to Date: 7
Plan Prescribed Dose Per Fraction: 2 Gy
Plan Total Fractions Prescribed: 15
Plan Total Prescribed Dose: 30 Gy
Reference Point Dosage Given to Date: 50 Gy
Reference Point Session Dosage Given: 2 Gy
Session Number: 25

## 2022-01-29 MED ORDER — SUCRALFATE 1 G PO TABS: 1.0000 g | ORAL_TABLET | Freq: Three times a day (TID) | ORAL | 2 refills | Status: DC

## 2022-01-29 NOTE — Progress Notes (Signed)
Jorge Adams in to see Dr. Tammi Klippel having difficulty swallowing and tenderness of throat.  Was seen on 01/28/2022 by Dr. Julien Nordmann prescribe cough medication and wanted to see Dr. Tammi Klippel for the swallowing issue.

## 2022-01-30 ENCOUNTER — Other Ambulatory Visit: Payer: Self-pay

## 2022-01-30 ENCOUNTER — Ambulatory Visit
Admission: RE | Admit: 2022-01-30 | Discharge: 2022-01-30 | Disposition: A | Discharge status: Home / Self Care | Payer: Medicare HMO | Source: Ambulatory Visit | Attending: Radiation Oncology | Admitting: Radiation Oncology

## 2022-01-30 DIAGNOSIS — R059 Cough, unspecified: Secondary | ICD-10-CM | POA: Diagnosis not present

## 2022-01-30 DIAGNOSIS — C3431 Malignant neoplasm of lower lobe, right bronchus or lung: Secondary | ICD-10-CM | POA: Diagnosis not present

## 2022-01-30 DIAGNOSIS — C3491 Malignant neoplasm of unspecified part of right bronchus or lung: Secondary | ICD-10-CM | POA: Diagnosis not present

## 2022-01-30 DIAGNOSIS — Z87891 Personal history of nicotine dependence: Secondary | ICD-10-CM | POA: Diagnosis not present

## 2022-01-30 DIAGNOSIS — Z510 Encounter for antineoplastic radiation therapy: Secondary | ICD-10-CM | POA: Diagnosis not present

## 2022-01-30 LAB — RAD ONC ARIA SESSION SUMMARY
Course Elapsed Days: 36
Plan Fractions Treated to Date: 8
Plan Prescribed Dose Per Fraction: 2 Gy
Plan Total Fractions Prescribed: 15
Plan Total Prescribed Dose: 30 Gy
Reference Point Dosage Given to Date: 52 Gy
Reference Point Session Dosage Given: 2 Gy
Session Number: 26

## 2022-01-30 NOTE — Progress Notes (Signed)
[facility] OFFICE PROGRESS NOTE  [name], MD [place] [address]  DIAGNOSIS: Stage IIIb (T4, N0/N2, M0) non-small cell lung cancer, squamous cell carcinoma presented with large right lower lobe lung mass in addition to satellite nodules close to the mass and suspicious mediastinal lymphadenopathy diagnosed in May 2023   PD-L1 expression 1%  PRIOR THERAPY: None  CURRENT THERAPY: A course of concurrent chemoradiation with weekly carboplatin for AUC of 2 and paclitaxel 45 Mg/M2.  First dose of chemotherapy starts 12/25/2021.  Status post 5 cycles.  INTERVAL HISTORY: Jorge Adams 71 y.o. male returns to the clinic today for a follow-up visit accompanied by his wife.  The patient is currently undergoing treatment concurrent chemoradiation.  His last day radiation is tentatively scheduled for this Friday, 02/08/2022.  The patient is endorsing odynophagia and dysphagia for which Dr. Tammi Klippel prescribed carafate.  The patient and his wife report his most recent weight was 187 pounds last week.  He weighs 182 pounds today.  He tries to drink Premier protein drinks 2-3 times a day.  He reports taste alterations without any evidence of thrush.  Regarding his chemotherapy, the patient is undergoing weekly Carbo/Taxol.  He has some fatigue and cold intolerance likely secondary to his anemia.  He also has neutropenia occasionally on weekly labs.  He has also been seen for the chief complaint of cough.  He was given antibiotics a few weeks ago.  He reports his cough at that time was a "rattling cough" and now he has a dry cough.  He takes Hycodan.  He is requesting a refill today.  The patient is wife would feel more comfortable for a chest x-ray today to ensure no recurrent infections.  He denies any fevers since last being seen or night sweats. He denies any hemoptysis or chest discomfort.  He has some shortness of breath with exertion.  He has mild nausea which  is controlled with his antiemetic.  He reports he has been having ongoing loose stool since starting treatment several weeks ago.  He sometimes may take 1 Imodium per day.  He reports if he coughs he may have some "leakage" of stool.  Denies any abdominal pain.  If he does not take Imodium, he may have 3-4 loose stools per day of small volume.  Denies any nausea, vomiting, diarrhea, or constipation.  Denies any headache or visual changes.  His blood pressure is low today.  He took his antihypertensive medications around 530 or 6 AM this morning.  He has the capacity to check his blood pressure at home.  He is here today for evaluation and repeat blood work before considering starting his last cycle of chemotherapy.     MEDICAL HISTORY: Past Medical History:  Diagnosis Date   Arthritis    ASHD (arteriosclerotic heart disease)    Atherosclerosis of native arteries of the extremities with intermittent claudication    Cataract    Coronary atherosclerosis of native coronary artery    Diabetes mellitus    NIDDM   Dyslipidemia    ED (erectile dysfunction)    Full dentures    Glaucoma associated with ocular disorder, mild stage    bilateral   Hyperlipidemia    Hypertension    under control, has been on med. > 9 yr.   Impotence of organic origin    Meniscus tear 05/2012   right knee   Myocardial infarction [facility]) 10/2002   Stented coronary artery  ALLERGIES:  has No Known Allergies.  MEDICATIONS:  Current Outpatient Medications  Medication Sig Dispense Refill   albuterol (VENTOLIN HFA) 108 (90 Base) MCG/ACT inhaler Inhale 1-2 puffs into the lungs every 6 (six) hours as needed (for cough.  okay to fill with albuterol/proair/ventolin.). 8 g 2   amLODipine-benazepril (LOTREL) 5-20 MG capsule TAKE 1 CAPSULE BY MOUTH EVERY DAY 90 capsule 3   aspirin EC 81 MG tablet Take 1 tablet (81 mg total) by mouth daily. Okay to restart this medication on 12/05/2021. 30 tablet 11   benzonatate (TESSALON)  200 MG capsule Take 1 capsule (200 mg total) by mouth 3 (three) times daily as needed. 30 capsule 3   blood glucose meter kit and supplies Dispense based on pt and insurance preference. Use up to 3 times daily as directed. (Dx. E11.9). 1 each 12   Blood Pressure Monitoring (SPHYGMOMANOMETER) MISC Use daily to check BP.  Dx I10 1 each 0   brimonidine (ALPHAGAN P) 0.1 % SOLN Place 1 drop into both eyes 2 (two) times daily.     cilostazol (PLETAL) 100 MG tablet Take 1 tablet (100 mg total) by mouth 2 (two) times daily. Okay to restart this medication on 12/05/2021. 180 tablet 1   dorzolamide-timolol (COSOPT) 22.3-6.8 MG/ML ophthalmic solution Place 1 drop into both eyes 2 (two) times daily.     fenofibrate (TRICOR) 145 MG tablet TAKE 1 TABLET BY MOUTH EVERY DAY IN THE EVENING 90 tablet 1   metFORMIN (GLUCOPHAGE) 500 MG tablet Take 1 tablet (500 mg total) by mouth 2 (two) times daily with a meal.     metoprolol tartrate (LOPRESSOR) 25 MG tablet TAKE 1 TABLET BY MOUTH TWICE A DAY 180 tablet 4   Netarsudil-Latanoprost (ROCKLATAN) 0.02-0.005 % SOLN Apply 1 drop to eye at bedtime. In both eyes     nitroGLYCERIN (NITROSTAT) 0.4 MG SL tablet Place 1 tablet (0.4 mg total) under the tongue every 5 (five) minutes as needed for chest pain. 25 tablet 5   omega-3 acid ethyl esters (LOVAZA) 1 g capsule TAKE 1 CAPSULE BY MOUTH TWICE A DAY 180 capsule 1   rosuvastatin (CRESTOR) 20 MG tablet TAKE 1 TABLET BY MOUTH EVERY DAY 90 tablet 0   sucralfate (CARAFATE) 1 g tablet Take 1 tablet (1 g total) by mouth 4 (four) times daily -  with meals and at bedtime. 5 min before meals for radiation induced esophagitis 120 tablet 2   HYDROcodone bit-homatropine (HYCODAN) 5-1.5 MG/5ML syrup Take 5 mLs by mouth every 8 (eight) hours as needed for cough (sedation caution.). 120 mL 0   prochlorperazine (COMPAZINE) 10 MG tablet Take 1 tablet (10 mg total) by mouth every 6 (six) hours as needed for nausea or vomiting. 30 tablet 2   No  current facility-administered medications for this visit.    SURGICAL HISTORY:  Past Surgical History:  Procedure Laterality Date   ANTERIOR CERVICAL DECOMP/DISCECTOMY FUSION N/A 05/07/2018   Procedure: ANTERIOR CERVICAL DECOMPRESSION/DISCECTOMY FUSION CERVICAL FIVE - CERVICAL SIX;  Surgeon: Consuella Lose, MD;  Location: [facility];  Service: Neurosurgery;  Laterality: N/A;  ANTERIOR CERVICAL DECOMPRESSION/DISCECTOMY FUSION CERVICAL FIVE - CERVICAL SIX   BRONCHIAL BIOPSY  12/04/2021   Procedure: BRONCHIAL BIOPSIES;  Surgeon: Collene Gobble, MD;  Location: [facility];  Service: Cardiopulmonary;;   BRONCHIAL BRUSHINGS  12/04/2021   Procedure: BRONCHIAL BRUSHINGS;  Surgeon: Collene Gobble, MD;  Location: [facility];  Service: Cardiopulmonary;;   BRONCHIAL NEEDLE ASPIRATION BIOPSY  12/04/2021   Procedure:  BRONCHIAL NEEDLE ASPIRATION BIOPSIES;  Surgeon: Collene Gobble, MD;  Location: [facility] ENDOSCOPY;  Service: Cardiopulmonary;;   CARDIAC CATHETERIZATION  10/23/2002   had 1 stent placed then-- by Dr. Melvern Banker   CARDIAC CATHETERIZATION N/A 12/21/2015   Procedure: Left Heart Cath and Coronary Angiography;  Surgeon: Adrian Prows, MD;  Location: [facility] CV LAB;  Service: Cardiovascular;  Laterality: N/A;   CATARACT EXTRACTION Right 05/2017   CATARACT EXTRACTION Left 07/2017   COLONOSCOPY  10/08/2010   Dr.Patterson   CORONARY ARTERY BYPASS GRAFT N/A 01/02/2016   Procedure: CORONARY ARTERY BYPASS GRAFTING (CABG) TIMES 3 USING LEFT INTERNAL MAMMARY ARTERY AND RIGHT SAPHENOUS LEG VEIN HARVESTED ENDOSCOPICALLY;  Surgeon: Ivin Poot, MD;  Location: [facility];  Service: Open Heart Surgery;  Laterality: N/A;   EYE SURGERY     INSERTION OF MESH N/A 05/06/2016   Procedure: INSERTION OF MESH;  Surgeon: Georganna Skeans, MD;  Location: [facility];  Service: General;  Laterality: N/A;   KNEE ARTHROSCOPY WITH MEDIAL MENISECTOMY  06/04/2012   Procedure: KNEE ARTHROSCOPY WITH MEDIAL MENISECTOMY;  Surgeon: Ninetta Lights, MD;  Location: [facility];  Service: Orthopedics;  Laterality: Right;  RIGHT SCOPE MEDIAL MENISCECTOMY, CHONDROPLASTY, EXCISION LOOSE BODY   LOWER EXTREMITY ANGIOGRAM N/A 11/23/2013   Procedure: LOWER EXTREMITY ANGIOGRAM;  Surgeon: Laverda Page, MD;  Location: [facility] [place] [facility] CATH LAB;  Service: Cardiovascular;  Laterality: N/A;   NECK SURGERY N/A    02/19/2018   PERIPHERAL VASCULAR CATHETERIZATION N/A 12/21/2015   Procedure: Abdominal Aortogram;  Surgeon: Adrian Prows, MD;  Location: [facility] CV LAB;  Service: Cardiovascular;  Laterality: N/A;   PERIPHERAL VASCULAR CATHETERIZATION N/A 12/21/2015   Procedure: Abdominal Aortogram w/Lower Extremity;  Surgeon: Adrian Prows, MD;  Location: [facility] CV LAB;  Service: Cardiovascular;  Laterality: N/A;   TEE WITHOUT CARDIOVERSION N/A 01/02/2016   Procedure: TRANSESOPHAGEAL ECHOCARDIOGRAM (TEE);  Surgeon: Ivin Poot, MD;  Location: [facility];  Service: Open Heart Surgery;  Laterality: N/A;   UMBILICAL HERNIA REPAIR N/A 05/06/2016   Procedure: HERNIA REPAIR UMBILICAL ADULT;  Surgeon: Georganna Skeans, MD;  Location: [facility];  Service: General;  Laterality: N/A;   VIDEO BRONCHOSCOPY N/A 12/04/2021   Procedure: VIDEO BRONCHOSCOPY WITH FLUORO;  Surgeon: Collene Gobble, MD;  Location: [facility];  Service: Cardiopulmonary;  Laterality: N/A;    REVIEW OF SYSTEMS:   Review of Systems  Constitutional: Positive for fatigue, weight loss, and appetite change.  Negative for chills and fever. HENT: Positive for odynophagia and dysphagia.  Negative for mouth sores, nosebleeds.  Eyes: Negative for eye problems and icterus.  Respiratory: Positive for dyspnea on exertion and dry cough.  Negative for hemoptysis and wheezing.   Cardiovascular: Negative for chest pain and leg swelling.  Gastrointestinal: Positive for mild nausea.  Positive for loose stool.  Negative for abdominal pain, constipation, and vomiting.  Genitourinary: Negative for bladder  incontinence, difficulty urinating, dysuria, frequency and hematuria.   Musculoskeletal: Negative for back pain, gait problem, neck pain and neck stiffness.  Skin: Negative for itching and rash.  Neurological: Negative for dizziness, extremity weakness, gait problem, headaches, light-headedness and seizures.  Hematological: Negative for adenopathy. Does not bruise/bleed easily.  Psychiatric/Behavioral: Negative for confusion, depression and sleep disturbance. The patient is not nervous/anxious.     PHYSICAL EXAMINATION:  Blood pressure (!) 82/51, pulse 83, temperature 97.8 F (36.6 C), temperature source Temporal, resp. rate 17, height 5' 10" (1.778 m), weight 182 lb (82.6 kg), SpO2 91 %.  ECOG PERFORMANCE STATUS: 1  Physical Exam  Constitutional: Oriented to person, place, and time and well-developed, well-nourished, and in no distress.  HENT:  Head: Normocephalic and atraumatic.  Mouth/Throat: Oropharynx is clear and moist. No oropharyngeal exudate.  Eyes: Conjunctivae are normal. Right eye exhibits no discharge. Left eye exhibits no discharge. No scleral icterus.  Neck: Normal range of motion. Neck supple.  Cardiovascular: Normal rate, regular rhythm, normal heart sounds and intact distal pulses.   Pulmonary/Chest: Effort normal and breath sounds normal. No respiratory distress. No wheezes. No rales.  Abdominal: Soft. Bowel sounds are normal. Exhibits no distension and no mass. There is no tenderness.  Musculoskeletal: Normal range of motion. Exhibits no edema.  Lymphadenopathy:    No cervical adenopathy.  Neurological: Alert and oriented to person, place, and time. Exhibits normal muscle tone.  Examined in the wheelchair. Skin: Skin is warm and dry. No rash noted. Not diaphoretic. No erythema. No pallor.  Psychiatric: Mood, memory and judgment normal.  Vitals reviewed.  LABORATORY DATA: Lab Results  Component Value Date   WBC 3.5 (L) 02/04/2022   HGB 8.7 (L) 02/04/2022   HCT  25.5 (L) 02/04/2022   MCV 78.5 (L) 02/04/2022   PLT 266 02/04/2022      Chemistry      Component Value Date/Time   NA 131 (L) 02/04/2022 0828   NA 143 04/10/2018 1157   K 3.2 (L) 02/04/2022 0828   CL 99 02/04/2022 0828   CO2 25 02/04/2022 0828   BUN 15 02/04/2022 0828   BUN 8 04/10/2018 1157   CREATININE 0.85 02/04/2022 0828   CREATININE 0.78 05/02/2017 0000   CREATININE 0.71 10/26/2012 0903      Component Value Date/Time   CALCIUM 9.0 02/04/2022 0828   ALKPHOS 59 02/04/2022 0828   AST 18 02/04/2022 0828   ALT 15 02/04/2022 0828   BILITOT 0.5 02/04/2022 0828       RADIOGRAPHIC STUDIES:  DG Chest 2 View  Result Date: 01/14/2022 CLINICAL DATA:  Cough, fever EXAM: CHEST - 2 VIEW COMPARISON:  Previous studies including the examination done on 11/06/2021 FINDINGS: Cardiac size is within normal limits. There is evidence of previous coronary bypass surgery. There are no signs of alveolar pulmonary edema. There is a large mass lesion with air fluid level in the posterior right mid to lower lung fields in the right lower lobe measuring proximally 9.4 x 9.2 by 11 cm. There is significant interval increase in size. Rest of the lung fields are essentially clear. There is no pleural effusion or pneumothorax. IMPRESSION: There is a large mass lesion measuring 11 cm in maximum diameter in the posterior right mid and right lower lung fields with interval increase in size. Findings suggest malignant neoplasm in the right lower lobe with interval progression. There is air-fluid level within the lesion suggesting possible necrosis in the neoplasm or suggest superimposed infectious process. Electronically Signed   By: Elmer Picker M.D.   On: 01/14/2022 14:07     ASSESSMENT/PLAN:  This is a very pleasant 71 year old Caucasian male diagnosed with stage IIIb (T4, N0/N2, M0) non-small cell lung cancer, squamous cell carcinoma.  He presented with a large right lower lobe lung mass in addition to  satellite nodules close to the mass and suspicious mediastinal lymphadenopathy.  He was diagnosed in May 2023.  His PD-L1 expression is 1%.  The patient is currently undergoing a course of concurrent chemoradiation with weekly carboplatin for an AUC of 2 and paclitaxel 45 mg  per metered square.  He is status post 5 cycles.  The patient has been having fatigue and radiation-induced esophagitis.  The patient's last day radiation is tentatively scheduled for 02/08/2022.  I reviewed the patient's blood pressure with Dr. Julien Nordmann.  The patient has not been eating or drinking due to radiation esophagitis.  He is expected to complete radiation this week.  He is also having some taste alterations without evidence of thrush.  We reviewed foods that tend to taste poorly and foods that tend to taste well while undergoing carboplatin.  He was encouraged to continue to use protein drinks and to hydrate well at home.  We will arrange for him to receive 1 L of fluid while in the infusion room today.  I advised him to check his blood pressure closely at home and to not take his blood pressure medications if his systolic blood pressure is less than 130.  He will come in every day this week for radiation and discussed that they should call us if they feel that he needs additional IV hydration due to his poor p.o. intake.  We are hopeful that his anemia and his swallowing will improve in the next few weeks.   Labs reviewed.  Recommend that he proceed with cycle #6 today as scheduled.  I will arrange for restaging CT scan to be performed 3 weeks after radiation to assess the response to treatment.   We will then see him back for a follow-up visit a few days after his scan to review the results in the next steps in his care.  I have refilled his Compazine and I called in today.  We will arrange for the patient to have a chest x-ray performed today to ensure no signs of infection.  Discussed with the patient his wife it is  not uncommon for patients with lung malignancies to show postobstructive changes that cannot exclude infectious process.   I also reviewed the dosing of Imodium with the patient today.  The occasional loose stool is likely contributing to his dehydration in addition to his poor p.o. intake.  This is also likely accounting for the mild hypokalemia on labs today.  I have sent a prescription of potassium supplement to his pharmacy.  Discussed he can crush these in applesauce if needed.  The patient was advised to call immediately if he has any concerning symptoms in the interval. The patient voices understanding of current disease status and treatment options and is in agreement with the current care plan. All questions were answered. The patient knows to call the clinic with any problems, questions or concerns. We can certainly see the patient much sooner if necessary      Orders Placed This Encounter  Procedures   DG Chest 2 View    Standing Status:   Future    Standing Expiration Date:   02/04/2023    Order Specific Question:   Reason for Exam (SYMPTOM  OR DIAGNOSIS REQUIRED)    Answer:   Lung cancer, cough. R/o infectious process.    Order Specific Question:   Preferred imaging location?    Answer:   [facility]   CT Chest W Contrast    Standing Status:   Future    Standing Expiration Date:   02/04/2023    Order Specific Question:   If indicated for the ordered procedure, I authorize the administration of contrast media per Radiology protocol    Answer:   Yes    Order Specific  Question:   Preferred imaging location?    Answer:   [facility]     The total time spent in the appointment was 30-39 minutes.    L , PA-C 02/04/22

## 2022-01-31 ENCOUNTER — Ambulatory Visit (INDEPENDENT_AMBULATORY_CARE_PROVIDER_SITE_OTHER): Payer: Medicare HMO

## 2022-01-31 ENCOUNTER — Ambulatory Visit
Admission: RE | Admit: 2022-01-31 | Discharge: 2022-01-31 | Disposition: A | Discharge status: Home / Self Care | Payer: Medicare HMO | Source: Ambulatory Visit | Attending: Radiation Oncology | Admitting: Radiation Oncology

## 2022-01-31 ENCOUNTER — Other Ambulatory Visit: Payer: Self-pay

## 2022-01-31 ENCOUNTER — Ambulatory Visit: Payer: Medicare HMO

## 2022-01-31 VITALS — Wt 197.0 lb

## 2022-01-31 DIAGNOSIS — R059 Cough, unspecified: Secondary | ICD-10-CM | POA: Diagnosis not present

## 2022-01-31 DIAGNOSIS — Z0000 Encounter for general adult medical examination without abnormal findings: Secondary | ICD-10-CM

## 2022-01-31 DIAGNOSIS — C3491 Malignant neoplasm of unspecified part of right bronchus or lung: Secondary | ICD-10-CM | POA: Diagnosis not present

## 2022-01-31 LAB — RAD ONC ARIA SESSION SUMMARY
Course Elapsed Days: 37
Plan Fractions Treated to Date: 9
Plan Prescribed Dose Per Fraction: 2 Gy
Plan Total Fractions Prescribed: 15
Plan Total Prescribed Dose: 30 Gy
Reference Point Dosage Given to Date: 54 Gy
Reference Point Session Dosage Given: 2 Gy
Session Number: 27

## 2022-01-31 NOTE — Progress Notes (Signed)
Virtual Visit via Telephone Note  I connected with  Jorge Adams on 01/31/22 at  2:00 PM EDT by telephone and verified that I am speaking with the correct person using two identifiers.  Location: Patient: home Provider: Melbeta Persons participating in the virtual visit: [place]   I discussed the limitations, risks, security and privacy concerns of performing an evaluation and management service by telephone and the availability of in person appointments. The patient expressed understanding and agreed to proceed.  Interactive audio and video telecommunications were attempted between this nurse and patient, however failed, due to patient having technical difficulties OR patient did not have access to video capability.  We continued and completed visit with audio only.  Some vital signs may be absent or patient reported.   Dionisio David, LPN  Subjective:   Jorge Adams is a 71 y.o. male who presents for Medicare Annual/Subsequent preventive examination.  Review of Systems           Objective:    There were no vitals filed for this visit. There is no height or weight on file to calculate BMI.     01/21/2022   10:23 AM 01/21/2022    9:36 AM 12/31/2021   10:00 AM 12/25/2021    8:42 AM 12/10/2021    8:06 AM 12/07/2021    9:54 AM 12/04/2021   11:31 AM  Advanced Directives  Does Patient Have a Medical Advance Directive? Yes Yes Yes Yes Yes Yes Yes  Type of Theatre stage manager of [place];Living will Living will;Healthcare Power of [place];Living will  Does patient want to make changes to medical advance directive? No - Patient declined  No - Patient declined      Copy of [place] in Chart?    Yes - validated most recent copy scanned in chart (See row information)  No - copy requested No - copy requested  Would patient like information on creating a  medical advance directive?    No - Patient declined       Current Medications (verified) Outpatient Encounter Medications as of 01/31/2022  Medication Sig   albuterol (VENTOLIN HFA) 108 (90 Base) MCG/ACT inhaler Inhale 1-2 puffs into the lungs every 6 (six) hours as needed (for cough.  okay to fill with albuterol/proair/ventolin.).   amLODipine-benazepril (LOTREL) 5-20 MG capsule TAKE 1 CAPSULE BY MOUTH EVERY DAY   aspirin EC 81 MG tablet Take 1 tablet (81 mg total) by mouth daily. Okay to restart this medication on 12/05/2021.   benzonatate (TESSALON) 200 MG capsule Take 1 capsule (200 mg total) by mouth 3 (three) times daily as needed.   blood glucose meter kit and supplies Dispense based on pt and insurance preference. Use up to 3 times daily as directed. (Dx. E11.9).   Blood Pressure Monitoring (SPHYGMOMANOMETER) MISC Use daily to check BP.  Dx I10   brimonidine (ALPHAGAN P) 0.1 % SOLN Place 1 drop into both eyes 2 (two) times daily.   cilostazol (PLETAL) 100 MG tablet Take 1 tablet (100 mg total) by mouth 2 (two) times daily. Okay to restart this medication on 12/05/2021.   dorzolamide-timolol (COSOPT) 22.3-6.8 MG/ML ophthalmic solution Place 1 drop into both eyes 2 (two) times daily.   fenofibrate (TRICOR) 145 MG tablet TAKE 1 TABLET BY MOUTH EVERY DAY IN THE EVENING   HYDROcodone bit-homatropine (HYCODAN) 5-1.5 MG/5ML syrup Take 5 mLs by mouth  every 8 (eight) hours as needed for cough (sedation caution.).   metFORMIN (GLUCOPHAGE) 500 MG tablet Take 1 tablet (500 mg total) by mouth 2 (two) times daily with a meal.   metoprolol tartrate (LOPRESSOR) 25 MG tablet TAKE 1 TABLET BY MOUTH TWICE A DAY   Netarsudil-Latanoprost (ROCKLATAN) 0.02-0.005 % SOLN Apply 1 drop to eye at bedtime. In both eyes   nitroGLYCERIN (NITROSTAT) 0.4 MG SL tablet Place 1 tablet (0.4 mg total) under the tongue every 5 (five) minutes as needed for chest pain.   omega-3 acid ethyl esters (LOVAZA) 1 g capsule TAKE 1  CAPSULE BY MOUTH TWICE A DAY   prochlorperazine (COMPAZINE) 10 MG tablet Take 1 tablet (10 mg total) by mouth every 6 (six) hours as needed for nausea or vomiting.   rosuvastatin (CRESTOR) 20 MG tablet TAKE 1 TABLET BY MOUTH EVERY DAY   sucralfate (CARAFATE) 1 g tablet Take 1 tablet (1 g total) by mouth 4 (four) times daily -  with meals and at bedtime. 5 min before meals for radiation induced esophagitis   No facility-administered encounter medications on file as of 01/31/2022.    Allergies (verified) Patient has no known allergies.   History: Past Medical History:  Diagnosis Date   Arthritis    ASHD (arteriosclerotic heart disease)    Atherosclerosis of native arteries of the extremities with intermittent claudication    Cataract    Coronary atherosclerosis of native coronary artery    Diabetes mellitus    NIDDM   Dyslipidemia    ED (erectile dysfunction)    Full dentures    Glaucoma associated with ocular disorder, mild stage    bilateral   Hyperlipidemia    Hypertension    under control, has been on med. > 9 yr.   Impotence of organic origin    Meniscus tear 05/2012   right knee   Myocardial infarction [facility]) 10/2002   Stented coronary artery    Past Surgical History:  Procedure Laterality Date   ANTERIOR CERVICAL DECOMP/DISCECTOMY FUSION N/A 05/07/2018   Procedure: ANTERIOR CERVICAL DECOMPRESSION/DISCECTOMY FUSION CERVICAL FIVE - CERVICAL SIX;  Surgeon: Consuella Lose, MD;  Location: [place];  Service: Neurosurgery;  Laterality: N/A;  ANTERIOR CERVICAL DECOMPRESSION/DISCECTOMY FUSION CERVICAL FIVE - CERVICAL SIX   BRONCHIAL BIOPSY  12/04/2021   Procedure: BRONCHIAL BIOPSIES;  Surgeon: Collene Gobble, MD;  Location: [place];  Service: Cardiopulmonary;;   BRONCHIAL BRUSHINGS  12/04/2021   Procedure: BRONCHIAL BRUSHINGS;  Surgeon: Collene Gobble, MD;  Location: [place];  Service: Cardiopulmonary;;   BRONCHIAL NEEDLE ASPIRATION BIOPSY  12/04/2021   Procedure:  BRONCHIAL NEEDLE ASPIRATION BIOPSIES;  Surgeon: Collene Gobble, MD;  Location: [place];  Service: Cardiopulmonary;;   CARDIAC CATHETERIZATION  10/23/2002   had 1 stent placed then-- by Dr. Melvern Banker   CARDIAC CATHETERIZATION N/A 12/21/2015   Procedure: Left Heart Cath and Coronary Angiography;  Surgeon: Adrian Prows, MD;  Location: [place] CV LAB;  Service: Cardiovascular;  Laterality: N/A;   CATARACT EXTRACTION Right 05/2017   CATARACT EXTRACTION Left 07/2017   COLONOSCOPY  10/08/2010   Dr.Patterson   CORONARY ARTERY BYPASS GRAFT N/A 01/02/2016   Procedure: CORONARY ARTERY BYPASS GRAFTING (CABG) TIMES 3 USING LEFT INTERNAL MAMMARY ARTERY AND RIGHT SAPHENOUS LEG VEIN HARVESTED ENDOSCOPICALLY;  Surgeon: Ivin Poot, MD;  Location: [place];  Service: Open Heart Surgery;  Laterality: N/A;   EYE SURGERY     INSERTION OF MESH N/A 05/06/2016   Procedure: INSERTION OF MESH;  Surgeon: Georganna Skeans, MD;  Location: [place];  Service: General;  Laterality: N/A;   KNEE ARTHROSCOPY WITH MEDIAL MENISECTOMY  06/04/2012   Procedure: KNEE ARTHROSCOPY WITH MEDIAL MENISECTOMY;  Surgeon: Ninetta Lights, MD;  Location: [place];  Service: Orthopedics;  Laterality: Right;  RIGHT SCOPE MEDIAL MENISCECTOMY, CHONDROPLASTY, EXCISION LOOSE BODY   LOWER EXTREMITY ANGIOGRAM N/A 11/23/2013   Procedure: LOWER EXTREMITY ANGIOGRAM;  Surgeon: Laverda Page, MD;  Location: [facility] CATH LAB;  Service: Cardiovascular;  Laterality: N/A;   NECK SURGERY N/A    02/19/2018   PERIPHERAL VASCULAR CATHETERIZATION N/A 12/21/2015   Procedure: Abdominal Aortogram;  Surgeon: Adrian Prows, MD;  Location: [place] CV LAB;  Service: Cardiovascular;  Laterality: N/A;   PERIPHERAL VASCULAR CATHETERIZATION N/A 12/21/2015   Procedure: Abdominal Aortogram w/Lower Extremity;  Surgeon: Adrian Prows, MD;  Location: [place] CV LAB;  Service: Cardiovascular;  Laterality: N/A;   TEE WITHOUT CARDIOVERSION N/A 01/02/2016    Procedure: TRANSESOPHAGEAL ECHOCARDIOGRAM (TEE);  Surgeon: Ivin Poot, MD;  Location: [place];  Service: Open Heart Surgery;  Laterality: N/A;   UMBILICAL HERNIA REPAIR N/A 05/06/2016   Procedure: HERNIA REPAIR UMBILICAL ADULT;  Surgeon: Georganna Skeans, MD;  Location: [place];  Service: General;  Laterality: N/A;   VIDEO BRONCHOSCOPY N/A 12/04/2021   Procedure: VIDEO BRONCHOSCOPY WITH FLUORO;  Surgeon: Collene Gobble, MD;  Location: [place];  Service: Cardiopulmonary;  Laterality: N/A;   Family History  Problem Relation Age of Onset   Bone cancer Mother    Pneumonia Father    Diabetes Brother    Heart disease Brother        Pacemaker   COPD Brother    Colon cancer Neg Hx    Prostate cancer Neg Hx    Esophageal cancer Neg Hx    Stomach cancer Neg Hx    Social History   Socioeconomic History   Marital status: Married    Spouse name: Not on file   Number of children: 2   Years of education: Not on file   Highest education level: Not on file  Occupational History   Occupation: retired  Tobacco Use   Smoking status: Former    Packs/day: 2.50    Years: 35.00    Total pack years: 87.50    Types: Cigarettes    Quit date: 12/02/2002    Years since quitting: 19.1   Smokeless tobacco: Never  Vaping Use   Vaping Use: Never used  Substance and Sexual Activity   Alcohol use: Yes    Comment: rare   Drug use: No   Sexual activity: Not on file  Other Topics Concern   Not on file  Social History Narrative   Retired Librarian, 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    Doristine Bosworth, [employer] fan   Social Determinants of Health   Financial Resource Strain: Low Risk  (01/30/2021)   Overall Financial Resource Strain (CARDIA)    Difficulty of Paying Living Expenses: Not hard at all  Food Insecurity: No Food Insecurity (01/30/2021)   Hunger Vital Sign    Worried About Running Out of Food in the Last  Year: Never true    Ran Out of Food in the Last Year: Never true  Transportation Needs: No Transportation Needs (01/30/2021)   PRAPARE - Transportation    Lack of Transportation (Medical): No    Lack of  Transportation (Non-Medical): No  Physical Activity: Unknown (01/31/2022)   Exercise Vital Sign    Days of Exercise per Week: Patient refused    Minutes of Exercise per Session: Patient refused  Stress: Stress Concern Present (01/31/2022)   [place]    Feeling of Stress : To some extent  Social Connections: Not on file    Tobacco Counseling Counseling given: Not Answered   Clinical Intake:  Pre-visit preparation completed: Yes  Pain : No/denies pain     Nutritional Risks: None Diabetes: Yes CBG done?: No Did pt. bring in CBG monitor from home?: No  How often do you need to have someone help you when you read instructions, pamphlets, or other written materials from your doctor or pharmacy?: 1 - Never  Diabetic?yes Nutrition Risk Assessment:  Has the patient had any N/V/D within the last 2 months?  Yes  Does the patient have any non-healing wounds?  No  Has the patient had any unintentional weight loss or weight gain?  No   Diabetes:  Is the patient diabetic?  Yes  If diabetic, was a CBG obtained today?  No  Did the patient bring in their glucometer from home?  No  How often do you monitor your CBG's? Every day.   Financial Strains and Diabetes Management:  Are you having any financial strains with the device, your supplies or your medication? No .  Does the patient want to be seen by Chronic Care Management for management of their diabetes?  No  Would the patient like to be referred to a Nutritionist or for Diabetic Management?  No   Diabetic Exams:  Diabetic Eye Exam: Completed 03/12/21. Pt has been advised about the importance in completing this exam.  States has exam every 3 months  Diabetic  Foot Exam: Completed 04/30/21 Pt has been advised about the importance in completing this exam.     Interpreter Needed?: No  Information entered by :: Kirke Shaggy, LPN   Activities of Daily Living     No data to display          Patient Care Team: [name], MD as PCP - General (Family Medicine) Adrian Prows, MD as Consulting Physician (Cardiology) [place], Heather, [place] as Referring Physician (Optometry) Warden Fillers, MD as Consulting Physician (Ophthalmology) Debbora Dus, [facility] as Pharmacist (Pharmacist)  Indicate any recent Medical Services you may have received from other than [facility] providers in the past year (date may be approximate).     Assessment:   This is a routine wellness examination for [place].  Hearing/Vision screen No results found.  Dietary issues and exercise activities discussed:     Goals Addressed             This Visit's Progress    DIET - EAT MORE FRUITS AND VEGETABLES         Depression Screen    01/31/2022    2:04 PM 01/30/2021   10:43 AM 09/09/2019    8:50 AM 09/03/2018    8:50 AM 08/25/2017    8:56 AM 04/30/2016   10:35 AM 02/20/2016    2:51 PM  PHQ 2/9 Scores  PHQ - 2 Score 0 0 0 0 0 0 0  PHQ- 9 Score 0 0 0 0 0 0 0    Fall Risk    01/30/2021   10:43 AM 09/09/2019    8:49 AM 09/03/2018    8:50 AM 08/25/2017    8:56 AM  02/20/2016    2:51 PM  Fall Risk   Falls in the past year? 0 0 1 No No  Number falls in past yr: 0 0 0    Injury with Fall? 0 0 1    Risk for fall due to : Medication side effect Medication side effect     Follow up Falls evaluation completed;Falls prevention discussed Falls evaluation completed;Falls prevention discussed       FALL RISK PREVENTION PERTAINING TO THE HOME:  Any stairs in or around the home? No  If so, are there any without handrails? No  Home free of loose throw rugs in walkways, pet beds, electrical cords, etc? Yes  Adequate lighting in your home to reduce risk of falls? Yes    ASSISTIVE DEVICES UTILIZED TO PREVENT FALLS:  Life alert? No  Use of a cane, walker or w/c? Yes  Grab bars in the bathroom? Yes  Shower chair or bench in shower? Yes  Elevated toilet seat or a handicapped toilet? No   Cognitive Function:unable presently      01/30/2021   10:46 AM 09/09/2019    8:55 AM 09/03/2018    8:50 AM 08/25/2017    8:57 AM  MMSE - Mini Mental State Exam  Orientation to time 5 5 5 5   Orientation to Place 5 5 5 5   Registration 3 3 3 3   Attention/ Calculation 5 5 0 0  Recall 3 3 3 2   Recall-comments    unable to recall 1 of 3 words  Language- name 2 objects   0 0  Language- repeat 1 1 1 1   Language- follow 3 step command   3 3  Language- read & follow direction   0 0  Write a sentence   0 0  Copy design   0 0  Total score   20 19        Immunizations Immunization History  Administered Date(s) Administered   Fluad Quad(high Dose 65+) 05/20/2019, 05/11/2020, 04/30/2021   Influenza Split 05/01/2012, 04/21/2013   Influenza,inj,Quad PF,6+ Mos 06/06/2015, 06/24/2016, 04/25/2017, 06/09/2018   Influenza-Unspecified 04/27/2014   PFIZER Comirnaty(Gray Top)Covid-19 Tri-Sucrose Vaccine 10/31/2020   PFIZER(Purple Top)SARS-COV-2 Vaccination 08/29/2019, 09/21/2019, 05/15/2020, 05/01/2021   Pneumococcal Conjugate-13 12/02/2014   Pneumococcal Polysaccharide-23 09/10/2010, 12/04/2015   Tdap 09/10/2010   Zoster Recombinat (Shingrix) 03/18/2019, 07/05/2019   Zoster, Live 09/10/2010    TDAP status: Due, Education has been provided regarding the importance of this vaccine. Advised may receive this vaccine at local pharmacy or Health Dept. Aware to provide a copy of the vaccination record if obtained from local pharmacy or Health Dept. Verbalized acceptance and understanding.  Flu Vaccine status: Up to date  Pneumococcal vaccine status: Up to date  Covid-19 vaccine status: Completed vaccines  Qualifies for Shingles Vaccine? Yes   Zostavax completed Yes    Shingrix Completed?: Yes  Screening Tests Health Maintenance  Topic Date Due   COVID-19 Vaccine (6 - Booster for Pfizer series) 06/26/2021   TETANUS/TDAP  01/31/2024 (Originally 09/10/2020)   INFLUENZA VACCINE  02/19/2022   OPHTHALMOLOGY EXAM  03/12/2022   FOOT EXAM  04/30/2022   HEMOGLOBIN A1C  04/30/2022   COLONOSCOPY (Pts 45-53yr Insurance coverage will need to be confirmed)  07/10/2024   Pneumonia Vaccine 71 Years old  Completed   Hepatitis C Screening  Completed   Zoster Vaccines- Shingrix  Completed   HPV VACCINES  Aged Out    Health Maintenance  Health Maintenance Due  Topic Date Due  COVID-19 Vaccine (6 - Booster for Pfizer series) 06/26/2021    Colorectal cancer screening: Type of screening: Colonoscopy. Completed 07/10/21. Repeat every 3 years  Lung Cancer Screening: (Low Dose CT Chest recommended if Age 23-80 years, 30 pack-year currently smoking OR have quit w/in 15years.) does not qualify.   Additional Screening:  Hepatitis C Screening: does qualify; Completed 11/29/15  Vision Screening: Recommended annual ophthalmology exams for early detection of glaucoma and other disorders of the eye. Is the patient up to date with their annual eye exam?  Yes  Who is the provider or what is the name of the office in which the patient attends annual eye exams? Dr.Groat If pt is not established with a provider, would they like to be referred to a provider to establish care? No .   Dental Screening: Recommended annual dental exams for proper oral hygiene  Community Resource Referral / Chronic Care Management: CRR required this visit?  No   CCM required this visit?  No      Plan:     I have personally reviewed and noted the following in the patient's chart:   Medical and social history Use of alcohol, tobacco or illicit drugs  Current medications and supplements including opioid prescriptions. Patient is currently taking opioid prescriptions. Information provided  to patient regarding non-opioid alternatives. Patient advised to discuss non-opioid treatment plan with their provider. Functional ability and status Nutritional status Physical activity Advanced directives List of other physicians Hospitalizations, surgeries, and ER visits in previous 12 months Vitals Screenings to include cognitive, depression, and falls Referrals and appointments  In addition, I have reviewed and discussed with patient certain preventive protocols, quality metrics, and best practice recommendations. A written personalized care plan for preventive services as well as general preventive health recommendations were provided to patient.     Dionisio David, LPN   2/67/1245   Nurse Notes: none

## 2022-01-31 NOTE — Patient Instructions (Signed)
Jorge Adams , Thank you for taking time to come for your Medicare Wellness Visit. I appreciate your ongoing commitment to your health goals. Please review the following plan we discussed and let me know if I can assist you in the future.   Screening recommendations/referrals: Colonoscopy: 07/10/21 Recommended yearly ophthalmology/optometry visit for glaucoma screening and checkup Recommended yearly dental visit for hygiene and checkup  Vaccinations: Influenza vaccine: 04/30/21 Pneumococcal vaccine: 12/04/15 Tdap vaccine: 09/10/10, due if have injury Shingles vaccine: Zostavax 09/10/10  Shingrix 03/18/19, 07/05/19 Covid-19: 08/29/19, 09/21/19, 05/15/20, 10/31/20, 05/01/21  Advanced directives: no  Conditions/risks identified: none  Next appointment: Follow up in one year for your annual wellness visit. 02/04/23 @ 1:45 pm by phone  Preventive Care 65 Years and Older, Male Preventive care refers to lifestyle choices and visits with your health care provider that can promote health and wellness. What does preventive care include? A yearly physical exam. This is also called an annual well check. Dental exams once or twice a year. Routine eye exams. Ask your health care provider how often you should have your eyes checked. Personal lifestyle choices, including: Daily care of your teeth and gums. Regular physical activity. Eating a healthy diet. Avoiding tobacco and drug use. Limiting alcohol use. Practicing safe sex. Taking low doses of aspirin every day. Taking vitamin and mineral supplements as recommended by your health care provider. What happens during an annual well check? The services and screenings done by your health care provider during your annual well check will depend on your age, overall health, lifestyle risk factors, and family history of disease. Counseling  Your health care provider may ask you questions about your: Alcohol use. Tobacco use. Drug use. Emotional  well-being. Home and relationship well-being. Sexual activity. Eating habits. History of falls. Memory and ability to understand (cognition). Work and work Statistician. Screening  You may have the following tests or measurements: Height, weight, and BMI. Blood pressure. Lipid and cholesterol levels. These may be checked every 5 years, or more frequently if you are over 83 years old. Skin check. Lung cancer screening. You may have this screening every year starting at age 56 if you have a 30-pack-year history of smoking and currently smoke or have quit within the past 15 years. Fecal occult blood test (FOBT) of the stool. You may have this test every year starting at age 54. Flexible sigmoidoscopy or colonoscopy. You may have a sigmoidoscopy every 5 years or a colonoscopy every 10 years starting at age 59. Prostate cancer screening. Recommendations will vary depending on your family history and other risks. Hepatitis C blood test. Hepatitis B blood test. Sexually transmitted disease (STD) testing. Diabetes screening. This is done by checking your blood sugar (glucose) after you have not eaten for a while (fasting). You may have this done every 1-3 years. Abdominal aortic aneurysm (AAA) screening. You may need this if you are a current or former smoker. Osteoporosis. You may be screened starting at age 58 if you are at high risk. Talk with your health care provider about your test results, treatment options, and if necessary, the need for more tests. Vaccines  Your health care provider may recommend certain vaccines, such as: Influenza vaccine. This is recommended every year. Tetanus, diphtheria, and acellular pertussis (Tdap, Td) vaccine. You may need a Td booster every 10 years. Zoster vaccine. You may need this after age 57. Pneumococcal 13-valent conjugate (PCV13) vaccine. One dose is recommended after age 48. Pneumococcal polysaccharide (PPSV23) vaccine. One dose is  recommended after  age 9. Talk to your health care provider about which screenings and vaccines you need and how often you need them. This information is not intended to replace advice given to you by your health care provider. Make sure you discuss any questions you have with your health care provider. Document Released: 08/04/2015 Document Revised: 03/27/2016 Document Reviewed: 05/09/2015 Elsevier Interactive Patient Education  2017 [place] Prevention in the Home Falls can cause injuries. They can happen to people of all ages. There are many things you can do to make your home safe and to help prevent falls. What can I do on the outside of my home? Regularly fix the edges of walkways and driveways and fix any cracks. Remove anything that might make you trip as you walk through a door, such as a raised step or threshold. Trim any bushes or trees on the path to your home. Use bright outdoor lighting. Clear any walking paths of anything that might make someone trip, such as rocks or tools. Regularly check to see if handrails are loose or broken. Make sure that both sides of any steps have handrails. Any raised decks and porches should have guardrails on the edges. Have any leaves, snow, or ice cleared regularly. Use sand or salt on walking paths during winter. Clean up any spills in your garage right away. This includes oil or grease spills. What can I do in the bathroom? Use night lights. Install grab bars by the toilet and in the tub and shower. Do not use towel bars as grab bars. Use non-skid mats or decals in the tub or shower. If you need to sit down in the shower, use a plastic, non-slip stool. Keep the floor dry. Clean up any water that spills on the floor as soon as it happens. Remove soap buildup in the tub or shower regularly. Attach bath mats securely with double-sided non-slip rug tape. Do not have throw rugs and other things on the floor that can make you trip. What can I do in the  bedroom? Use night lights. Make sure that you have a light by your bed that is easy to reach. Do not use any sheets or blankets that are too big for your bed. They should not hang down onto the floor. Have a firm chair that has side arms. You can use this for support while you get dressed. Do not have throw rugs and other things on the floor that can make you trip. What can I do in the kitchen? Clean up any spills right away. Avoid walking on wet floors. Keep items that you use a lot in easy-to-reach places. If you need to reach something above you, use a strong step stool that has a grab bar. Keep electrical cords out of the way. Do not use floor polish or wax that makes floors slippery. If you must use wax, use non-skid floor wax. Do not have throw rugs and other things on the floor that can make you trip. What can I do with my stairs? Do not leave any items on the stairs. Make sure that there are handrails on both sides of the stairs and use them. Fix handrails that are broken or loose. Make sure that handrails are as long as the stairways. Check any carpeting to make sure that it is firmly attached to the stairs. Fix any carpet that is loose or worn. Avoid having throw rugs at the top or bottom of the stairs. If  you do have throw rugs, attach them to the floor with carpet tape. Make sure that you have a light switch at the top of the stairs and the bottom of the stairs. If you do not have them, ask someone to add them for you. What else can I do to help prevent falls? Wear shoes that: Do not have high heels. Have rubber bottoms. Are comfortable and fit you well. Are closed at the toe. Do not wear sandals. If you use a stepladder: Make sure that it is fully opened. Do not climb a closed stepladder. Make sure that both sides of the stepladder are locked into place. Ask someone to hold it for you, if possible. Clearly mark and make sure that you can see: Any grab bars or  handrails. First and last steps. Where the edge of each step is. Use tools that help you move around (mobility aids) if they are needed. These include: Canes. Walkers. Scooters. Crutches. Turn on the lights when you go into a dark area. Replace any light bulbs as soon as they burn out. Set up your furniture so you have a clear path. Avoid moving your furniture around. If any of your floors are uneven, fix them. If there are any pets around you, be aware of where they are. Review your medicines with your doctor. Some medicines can make you feel dizzy. This can increase your chance of falling. Ask your doctor what other things that you can do to help prevent falls. This information is not intended to replace advice given to you by your health care provider. Make sure you discuss any questions you have with your health care provider. Document Released: 05/04/2009 Document Revised: 12/14/2015 Document Reviewed: 08/12/2014 Elsevier Interactive Patient Education  2017 [employer].

## 2022-02-01 ENCOUNTER — Other Ambulatory Visit: Payer: Self-pay

## 2022-02-01 ENCOUNTER — Ambulatory Visit
Admission: RE | Admit: 2022-02-01 | Discharge: 2022-02-01 | Disposition: A | Discharge status: Home / Self Care | Payer: Medicare HMO | Source: Ambulatory Visit | Attending: Radiation Oncology | Admitting: Radiation Oncology

## 2022-02-01 DIAGNOSIS — R059 Cough, unspecified: Secondary | ICD-10-CM | POA: Diagnosis not present

## 2022-02-01 DIAGNOSIS — C3491 Malignant neoplasm of unspecified part of right bronchus or lung: Secondary | ICD-10-CM | POA: Diagnosis not present

## 2022-02-01 LAB — RAD ONC ARIA SESSION SUMMARY
Course Elapsed Days: 38
Plan Fractions Treated to Date: 10
Plan Prescribed Dose Per Fraction: 2 Gy
Plan Total Fractions Prescribed: 15
Plan Total Prescribed Dose: 30 Gy
Reference Point Dosage Given to Date: 56 Gy
Reference Point Session Dosage Given: 2 Gy
Session Number: 28

## 2022-02-01 MED FILL — Dexamethasone Sodium Phosphate Inj 100 MG/10ML: INTRAMUSCULAR | Qty: 1 | Status: AC

## 2022-02-04 ENCOUNTER — Inpatient Hospital Stay: Payer: Medicare HMO | Admitting: Physician Assistant

## 2022-02-04 ENCOUNTER — Ambulatory Visit
Admission: RE | Admit: 2022-02-04 | Discharge: 2022-02-04 | Disposition: A | Discharge status: Home / Self Care | Payer: Medicare HMO | Source: Ambulatory Visit | Attending: Radiation Oncology | Admitting: Radiation Oncology

## 2022-02-04 ENCOUNTER — Ambulatory Visit (HOSPITAL_COMMUNITY)
Admission: RE | Admit: 2022-02-04 | Discharge: 2022-02-04 | Disposition: A | Discharge status: Home / Self Care | Payer: Medicare HMO | Source: Ambulatory Visit | Attending: Physician Assistant | Admitting: Physician Assistant

## 2022-02-04 ENCOUNTER — Other Ambulatory Visit: Payer: Self-pay

## 2022-02-04 ENCOUNTER — Inpatient Hospital Stay: Payer: Medicare HMO

## 2022-02-04 VITALS — BP 109/63 | HR 105 | Resp 16

## 2022-02-04 VITALS — BP 82/51 | HR 83 | Temp 97.8°F | Resp 17 | Ht 70.0 in | Wt 182.0 lb

## 2022-02-04 DIAGNOSIS — C3491 Malignant neoplasm of unspecified part of right bronchus or lung: Secondary | ICD-10-CM

## 2022-02-04 DIAGNOSIS — R059 Cough, unspecified: Secondary | ICD-10-CM | POA: Insufficient documentation

## 2022-02-04 DIAGNOSIS — E860 Dehydration: Secondary | ICD-10-CM | POA: Diagnosis not present

## 2022-02-04 DIAGNOSIS — Z85118 Personal history of other malignant neoplasm of bronchus and lung: Secondary | ICD-10-CM | POA: Diagnosis not present

## 2022-02-04 DIAGNOSIS — E876 Hypokalemia: Secondary | ICD-10-CM | POA: Diagnosis not present

## 2022-02-04 DIAGNOSIS — J984 Other disorders of lung: Secondary | ICD-10-CM | POA: Diagnosis not present

## 2022-02-04 DIAGNOSIS — M47816 Spondylosis without myelopathy or radiculopathy, lumbar region: Secondary | ICD-10-CM | POA: Diagnosis not present

## 2022-02-04 DIAGNOSIS — Z5111 Encounter for antineoplastic chemotherapy: Secondary | ICD-10-CM

## 2022-02-04 DIAGNOSIS — R911 Solitary pulmonary nodule: Secondary | ICD-10-CM | POA: Diagnosis not present

## 2022-02-04 LAB — CBC WITH DIFFERENTIAL (CANCER CENTER ONLY)
Abs Immature Granulocytes: 0.04 10*3/uL (ref 0.00–0.07)
Basophils Absolute: 0 10*3/uL (ref 0.0–0.1)
Basophils Relative: 1 %
Eosinophils Absolute: 0 10*3/uL (ref 0.0–0.5)
Eosinophils Relative: 1 %
HCT: 25.5 % — ABNORMAL LOW (ref 39.0–52.0)
Hemoglobin: 8.7 g/dL — ABNORMAL LOW (ref 13.0–17.0)
Immature Granulocytes: 1 %
Lymphocytes Relative: 14 %
Lymphs Abs: 0.5 10*3/uL — ABNORMAL LOW (ref 0.7–4.0)
MCH: 26.8 pg (ref 26.0–34.0)
MCHC: 34.1 g/dL (ref 30.0–36.0)
MCV: 78.5 fL — ABNORMAL LOW (ref 80.0–100.0)
Monocytes Absolute: 0.6 10*3/uL (ref 0.1–1.0)
Monocytes Relative: 16 %
Neutro Abs: 2.4 10*3/uL (ref 1.7–7.7)
Neutrophils Relative %: 67 %
Platelet Count: 266 10*3/uL (ref 150–400)
RBC: 3.25 MIL/uL — ABNORMAL LOW (ref 4.22–5.81)
RDW: 17.2 % — ABNORMAL HIGH (ref 11.5–15.5)
Smear Review: NORMAL
WBC Count: 3.5 10*3/uL — ABNORMAL LOW (ref 4.0–10.5)
nRBC: 0 % (ref 0.0–0.2)

## 2022-02-04 LAB — CMP (CANCER CENTER ONLY)
ALT: 15 U/L (ref 0–44)
AST: 18 U/L (ref 15–41)
Albumin: 3 g/dL — ABNORMAL LOW (ref 3.5–5.0)
Alkaline Phosphatase: 59 U/L (ref 38–126)
Anion gap: 7 (ref 5–15)
BUN: 15 mg/dL (ref 8–23)
CO2: 25 mmol/L (ref 22–32)
Calcium: 9 mg/dL (ref 8.9–10.3)
Chloride: 99 mmol/L (ref 98–111)
Creatinine: 0.85 mg/dL (ref 0.61–1.24)
GFR, Estimated: >60 mL/min (ref >60)
Glucose, Bld: 153 mg/dL — ABNORMAL HIGH (ref 70–99)
Potassium: 3.2 mmol/L — ABNORMAL LOW (ref 3.5–5.1)
Sodium: 131 mmol/L — ABNORMAL LOW (ref 135–145)
Total Bilirubin: 0.5 mg/dL (ref 0.3–1.2)
Total Protein: 6.3 g/dL — ABNORMAL LOW (ref 6.5–8.1)

## 2022-02-04 LAB — RAD ONC ARIA SESSION SUMMARY
Course Elapsed Days: 41
Plan Fractions Treated to Date: 11
Plan Prescribed Dose Per Fraction: 2 Gy
Plan Total Fractions Prescribed: 15
Plan Total Prescribed Dose: 30 Gy
Reference Point Dosage Given to Date: 58 Gy
Reference Point Session Dosage Given: 2 Gy
Session Number: 29

## 2022-02-04 MED ORDER — SODIUM CHLORIDE 0.9 % IV SOLN
223.8000 mg | Freq: Once | INTRAVENOUS | Status: AC
  Administered 2022-02-04: 220 mg via INTRAVENOUS
  Filled 2022-02-04: qty 22

## 2022-02-04 MED ORDER — PROCHLORPERAZINE MALEATE 10 MG PO TABS: 10.0000 mg | ORAL_TABLET | Freq: Four times a day (QID) | ORAL | 2 refills | Status: DC | PRN

## 2022-02-04 MED ORDER — SODIUM CHLORIDE 0.9 % IV SOLN: Freq: Once | INTRAVENOUS | Status: AC

## 2022-02-04 MED ORDER — HYDROCODONE BIT-HOMATROP MBR 5-1.5 MG/5ML PO SOLN: 5.0000 mL | Freq: Three times a day (TID) | ORAL | 0 refills | Status: DC | PRN

## 2022-02-04 MED ORDER — PALONOSETRON HCL INJECTION 0.25 MG/5ML
0.2500 mg | Freq: Once | INTRAVENOUS | Status: AC
  Administered 2022-02-04: 0.25 mg via INTRAVENOUS
  Filled 2022-02-04: qty 5

## 2022-02-04 MED ORDER — SODIUM CHLORIDE 0.9 % IV SOLN
10.0000 mg | Freq: Once | INTRAVENOUS | Status: AC
  Administered 2022-02-04: 10 mg via INTRAVENOUS
  Filled 2022-02-04: qty 10

## 2022-02-04 MED ORDER — FAMOTIDINE IN NACL 20-0.9 MG/50ML-% IV SOLN
20.0000 mg | Freq: Once | INTRAVENOUS | Status: AC
  Administered 2022-02-04: 20 mg via INTRAVENOUS
  Filled 2022-02-04: qty 50

## 2022-02-04 MED ORDER — PROCHLORPERAZINE MALEATE 10 MG PO TABS
10.0000 mg | ORAL_TABLET | Freq: Once | ORAL | Status: AC
  Administered 2022-02-04: 10 mg via ORAL
  Filled 2022-02-04: qty 1

## 2022-02-04 MED ORDER — SODIUM CHLORIDE 0.9 % IV SOLN
45.0000 mg/m2 | Freq: Once | INTRAVENOUS | Status: AC
  Administered 2022-02-04: 96 mg via INTRAVENOUS
  Filled 2022-02-04: qty 16

## 2022-02-04 MED ORDER — DIPHENHYDRAMINE HCL 50 MG/ML IJ SOLN
50.0000 mg | Freq: Once | INTRAMUSCULAR | Status: AC
  Administered 2022-02-04: 50 mg via INTRAVENOUS
  Filled 2022-02-04: qty 1

## 2022-02-04 MED ORDER — POTASSIUM CHLORIDE CRYS ER 20 MEQ PO TBCR: 20.0000 meq | EXTENDED_RELEASE_TABLET | Freq: Every day | ORAL | 0 refills | Status: DC

## 2022-02-04 NOTE — Patient Instructions (Signed)
[facility]  Discharge Instructions: Thank you for choosing [facility] to provide your oncology and hematology care.   If you have a lab appointment with the [facility], please go directly to the [facility] and check in at the registration area.   Wear comfortable clothing and clothing appropriate for easy access to any Portacath or PICC line.   We strive to give you quality time with your provider. You may need to reschedule your appointment if you arrive late (15 or more minutes).  Arriving late affects you and other patients whose appointments are after yours.  Also, if you miss three or more appointments without notifying the office, you may be dismissed from the clinic at the provider's discretion.      For prescription refill requests, have your pharmacy contact our office and allow 72 hours for refills to be completed.    Today you received the following chemotherapy and/or immunotherapy agents : Taxol, Carboplatin      To help prevent nausea and vomiting after your treatment, we encourage you to take your nausea medication as directed.  BELOW ARE SYMPTOMS THAT SHOULD BE REPORTED IMMEDIATELY: *FEVER GREATER THAN 100.4 F (38 C) OR HIGHER *CHILLS OR SWEATING *NAUSEA AND VOMITING THAT IS NOT CONTROLLED WITH YOUR NAUSEA MEDICATION *UNUSUAL SHORTNESS OF BREATH *UNUSUAL BRUISING OR BLEEDING *URINARY PROBLEMS (pain or burning when urinating, or frequent urination) *BOWEL PROBLEMS (unusual diarrhea, constipation, pain near the anus) TENDERNESS IN MOUTH AND THROAT WITH OR WITHOUT PRESENCE OF ULCERS (sore throat, sores in mouth, or a toothache) UNUSUAL RASH, SWELLING OR PAIN  UNUSUAL VAGINAL DISCHARGE OR ITCHING   Items with * indicate a potential emergency and should be followed up as soon as possible or go to the Emergency Department if any problems should occur.  Please show the CHEMOTHERAPY ALERT CARD or IMMUNOTHERAPY ALERT CARD at  check-in to the Emergency Department and triage nurse.  Should you have questions after your visit or need to cancel or reschedule your appointment, please contact [facility]  Dept: [phone]  and follow the prompts.  Office hours are 8:00 a.m. to 4:30 p.m. Monday - Friday. Please note that voicemails left after 4:00 p.m. may not be returned until the following business day.  We are closed weekends and major holidays. You have access to a nurse at all times for urgent questions. Please call the main number to the clinic Dept: [phone] and follow the prompts.   For any non-urgent questions, you may also contact your provider using MyChart. We now offer e-Visits for anyone 64 and older to request care online for non-urgent symptoms. For details visit [email or web address].   Also download the MyChart app! Go to the app store, search "MyChart", open the app, select [facility], and log in with your MyChart username and password.  Masks are optional in the cancer centers. If you would like for your care team to wear a mask while they are taking care of you, please let them know. For doctor visits, patients may have with them one support person who is at least 73 years old. At this time, visitors are not allowed in the infusion area.

## 2022-02-05 ENCOUNTER — Telehealth: Payer: Self-pay | Admitting: Physician Assistant

## 2022-02-05 ENCOUNTER — Ambulatory Visit
Admission: RE | Admit: 2022-02-05 | Discharge: 2022-02-05 | Disposition: A | Discharge status: Home / Self Care | Payer: Medicare HMO | Source: Ambulatory Visit | Attending: Radiation Oncology | Admitting: Radiation Oncology

## 2022-02-05 ENCOUNTER — Other Ambulatory Visit: Payer: Self-pay

## 2022-02-05 DIAGNOSIS — R059 Cough, unspecified: Secondary | ICD-10-CM | POA: Diagnosis not present

## 2022-02-05 DIAGNOSIS — C3491 Malignant neoplasm of unspecified part of right bronchus or lung: Secondary | ICD-10-CM | POA: Diagnosis not present

## 2022-02-05 LAB — RAD ONC ARIA SESSION SUMMARY
Course Elapsed Days: 42
Plan Fractions Treated to Date: 12
Plan Prescribed Dose Per Fraction: 2 Gy
Plan Total Fractions Prescribed: 15
Plan Total Prescribed Dose: 30 Gy
Reference Point Dosage Given to Date: 60 Gy
Reference Point Session Dosage Given: 2 Gy
Session Number: 30

## 2022-02-05 NOTE — Telephone Encounter (Signed)
The patient had an xray yesterday which showed improvement in the large cavitary right lung mass. No new or other abnormalities noted. I called him to review the results. Discussed we will assess his response to treatment better on upcoming CT scan. Discussed he does not need anti-biotics at this time as no pneumonia was mentioned. His cough is likely from the large lung mass and inflammation from his radiation. I refilled his cough medication yesterday. We will see him in a few weeks to review his CT. Of course, if he has any new or concerning symptoms in the interval, we would be happy to see him sooner.

## 2022-02-05 NOTE — Telephone Encounter (Signed)
Scheduled per 07/17 los, patient has been called and notified.

## 2022-02-06 ENCOUNTER — Ambulatory Visit
Admission: RE | Admit: 2022-02-06 | Discharge: 2022-02-06 | Disposition: A | Discharge status: Home / Self Care | Payer: Medicare HMO | Source: Ambulatory Visit | Attending: Radiation Oncology | Admitting: Radiation Oncology

## 2022-02-06 ENCOUNTER — Other Ambulatory Visit: Payer: Self-pay

## 2022-02-06 DIAGNOSIS — Z510 Encounter for antineoplastic radiation therapy: Secondary | ICD-10-CM | POA: Diagnosis not present

## 2022-02-06 DIAGNOSIS — C3491 Malignant neoplasm of unspecified part of right bronchus or lung: Secondary | ICD-10-CM | POA: Diagnosis not present

## 2022-02-06 DIAGNOSIS — C3431 Malignant neoplasm of lower lobe, right bronchus or lung: Secondary | ICD-10-CM | POA: Diagnosis not present

## 2022-02-06 DIAGNOSIS — Z87891 Personal history of nicotine dependence: Secondary | ICD-10-CM | POA: Diagnosis not present

## 2022-02-06 DIAGNOSIS — R059 Cough, unspecified: Secondary | ICD-10-CM | POA: Diagnosis not present

## 2022-02-06 LAB — RAD ONC ARIA SESSION SUMMARY
Course Elapsed Days: 43
Plan Fractions Treated to Date: 13
Plan Prescribed Dose Per Fraction: 2 Gy
Plan Total Fractions Prescribed: 15
Plan Total Prescribed Dose: 30 Gy
Reference Point Dosage Given to Date: 62 Gy
Reference Point Session Dosage Given: 2 Gy
Session Number: 31

## 2022-02-07 ENCOUNTER — Ambulatory Visit
Admission: RE | Admit: 2022-02-07 | Discharge: 2022-02-07 | Disposition: A | Discharge status: Home / Self Care | Payer: Medicare HMO | Source: Ambulatory Visit | Attending: Radiation Oncology | Admitting: Radiation Oncology

## 2022-02-07 ENCOUNTER — Other Ambulatory Visit: Payer: Self-pay

## 2022-02-07 ENCOUNTER — Telehealth: Payer: Self-pay | Admitting: Dietician

## 2022-02-07 DIAGNOSIS — R059 Cough, unspecified: Secondary | ICD-10-CM | POA: Diagnosis not present

## 2022-02-07 DIAGNOSIS — C3491 Malignant neoplasm of unspecified part of right bronchus or lung: Secondary | ICD-10-CM | POA: Diagnosis not present

## 2022-02-07 LAB — RAD ONC ARIA SESSION SUMMARY
Course Elapsed Days: 44
Plan Fractions Treated to Date: 14
Plan Prescribed Dose Per Fraction: 2 Gy
Plan Total Fractions Prescribed: 15
Plan Total Prescribed Dose: 30 Gy
Reference Point Dosage Given to Date: 64 Gy
Reference Point Session Dosage Given: 2 Gy
Session Number: 32

## 2022-02-07 NOTE — Telephone Encounter (Signed)
Patient screened on MST. First attempt to reach. Called at home/mobile# and spoke with patient who reported that he wasn't in an area to talk freely, and that he would be going home to sleep today. Provided my cell# on on text to return call to set up a nutrition consult.  April Manson, RDN, LDN Registered Dietitian, [facility] Part Time Remote (Usual office hours: Tuesday-Thursday) Cell: [phone]

## 2022-02-08 ENCOUNTER — Ambulatory Visit
Admission: RE | Admit: 2022-02-08 | Discharge: 2022-02-08 | Disposition: A | Discharge status: Home / Self Care | Payer: Medicare HMO | Source: Ambulatory Visit | Attending: Radiation Oncology | Admitting: Radiation Oncology

## 2022-02-08 ENCOUNTER — Encounter: Payer: Self-pay | Admitting: Urology

## 2022-02-08 ENCOUNTER — Other Ambulatory Visit: Payer: Self-pay | Admitting: Physician Assistant

## 2022-02-08 ENCOUNTER — Other Ambulatory Visit: Payer: Self-pay

## 2022-02-08 ENCOUNTER — Inpatient Hospital Stay: Payer: Medicare HMO

## 2022-02-08 DIAGNOSIS — I959 Hypotension, unspecified: Secondary | ICD-10-CM

## 2022-02-08 DIAGNOSIS — R059 Cough, unspecified: Secondary | ICD-10-CM | POA: Diagnosis not present

## 2022-02-08 DIAGNOSIS — C3491 Malignant neoplasm of unspecified part of right bronchus or lung: Secondary | ICD-10-CM

## 2022-02-08 LAB — RAD ONC ARIA SESSION SUMMARY
Course Elapsed Days: 45
Plan Fractions Treated to Date: 15
Plan Prescribed Dose Per Fraction: 2 Gy
Plan Total Fractions Prescribed: 15
Plan Total Prescribed Dose: 30 Gy
Reference Point Dosage Given to Date: 66 Gy
Reference Point Session Dosage Given: 2 Gy
Session Number: 33

## 2022-02-08 MED ORDER — SODIUM CHLORIDE 0.9 % IV SOLN: Freq: Once | INTRAVENOUS | Status: AC

## 2022-02-08 NOTE — Patient Instructions (Signed)
Rehydration, Adult Rehydration is the replacement of body fluids, salts, and minerals (electrolytes) that are lost during dehydration. Dehydration is when there is not enough water or other fluids in the body. This happens when you lose more fluids than you take in. Common causes of dehydration include: Not drinking enough fluids. This can occur when you are ill or doing activities that require a lot of energy, especially in hot weather. Conditions that cause loss of water or other fluids, such as diarrhea, vomiting, sweating, or urinating a lot. Other illnesses, such as fever or infection. Certain medicines, such as those that remove excess fluid from the body (diuretics). Symptoms of mild or moderate dehydration may include thirst, dry lips and mouth, and dizziness. Symptoms of severe dehydration may include increased heart rate, confusion, fainting, and not urinating. For severe dehydration, you may need to get fluids through an IV at the hospital. For mild or moderate dehydration, you can usually rehydrate at home by drinking certain fluids as told by your health care provider. What are the risks? Generally, rehydration is safe. However, taking in too much fluid (overhydration) can be a problem. This is rare. Overhydration can cause an electrolyte imbalance, kidney failure, or a decrease in salt (sodium) levels in the body. Supplies needed You will need an oral rehydration solution (ORS) if your health care provider tells you to use one. This is a drink to treat dehydration. It can be found in pharmacies and retail stores. How to rehydrate Fluids Follow instructions from your health care provider for rehydration. The kind of fluid and the amount you should drink depend on your condition. In general, you should choose drinks that you prefer. If told by your health care provider, drink an ORS. Make an ORS by following instructions on the package. Start by drinking small amounts, about  cup (120  mL) every 5-10 minutes. Slowly increase how much you drink until you have taken the amount recommended by your health care provider. Drink enough clear fluids to keep your urine pale yellow. If you were told to drink an ORS, finish it first, then start slowly drinking other clear fluids. Drink fluids such as: Water. This includes sparkling water and flavored water. Drinking only water can lead to having too little sodium in your body (hyponatremia). Follow the advice of your health care provider. Water from ice chips you suck on. Fruit juice with water you add to it (diluted). Sports drinks. Hot or cold herbal teas. Broth-based soups. Milk or milk products. Food Follow instructions from your health care provider about what to eat while you rehydrate. Your health care provider may recommend that you slowly begin eating regular foods in small amounts. Eat foods that contain a healthy balance of electrolytes, such as bananas, oranges, potatoes, tomatoes, and spinach. Avoid foods that are greasy or contain a lot of sugar. In some cases, you may get nutrition through a feeding tube that is passed through your nose and into your stomach (nasogastric tube, or NG tube). This may be done if you have uncontrolled vomiting or diarrhea. Beverages to avoid  Certain beverages may make dehydration worse. While you rehydrate, avoid drinking alcohol. How to tell if you are recovering from dehydration You may be recovering from dehydration if: You are urinating more often than before you started rehydrating. Your urine is pale yellow. Your energy level improves. You vomit less frequently. You have diarrhea less frequently. Your appetite improves or returns to normal. You feel less dizzy or less light-headed.   Your skin tone and color start to look more normal. Follow these instructions at home: Take over-the-counter and prescription medicines only as told by your health care provider. Do not take sodium  tablets. Doing this can lead to having too much sodium in your body (hypernatremia). Contact a health care provider if: You continue to have symptoms of mild or moderate dehydration, such as: Thirst. Dry lips. Slightly dry mouth. Dizziness. Dark urine or less urine than normal. Muscle cramps. You continue to vomit or have diarrhea. Get help right away if you: Have symptoms of dehydration that get worse. Have a fever. Have a severe headache. Have been vomiting and the following happens: Your vomiting gets worse or does not go away. Your vomit includes blood or green matter (bile). You cannot eat or drink without vomiting. Have problems with urination or bowel movements, such as: Diarrhea that gets worse or does not go away. Blood in your stool (feces). This may cause stool to look black and tarry. Not urinating, or urinating only a small amount of very dark urine, within 6-8 hours. Have trouble breathing. Have symptoms that get worse with treatment. These symptoms may represent a serious problem that is an emergency. Do not wait to see if the symptoms will go away. Get medical help right away. Call your local emergency services (911 in the [place] Do not drive yourself to the hospital. Summary Rehydration is the replacement of body fluids and minerals (electrolytes) that are lost during dehydration. Follow instructions from your health care provider for rehydration. The kind of fluid and amount you should drink depend on your condition. Slowly increase how much you drink until you have taken the amount recommended by your health care provider. Contact your health care provider if you continue to show signs of mild or moderate dehydration. This information is not intended to replace advice given to you by your health care provider. Make sure you discuss any questions you have with your health care provider. Document Revised: 09/08/2019 Document Reviewed: 07/19/2019 Elsevier Patient  Education  2023 [employer]  

## 2022-02-11 ENCOUNTER — Other Ambulatory Visit: Payer: Self-pay

## 2022-02-11 ENCOUNTER — Ambulatory Visit: Payer: Medicare HMO

## 2022-02-11 ENCOUNTER — Ambulatory Visit: Payer: Medicare HMO | Admitting: Internal Medicine

## 2022-02-11 ENCOUNTER — Other Ambulatory Visit: Payer: Medicare HMO

## 2022-02-12 ENCOUNTER — Emergency Department (HOSPITAL_COMMUNITY)
Admission: EM | Admit: 2022-02-12 | Discharge: 2022-02-12 | Disposition: A | Discharge status: Home / Self Care | Payer: Medicare HMO | Attending: Emergency Medicine | Admitting: Emergency Medicine

## 2022-02-12 ENCOUNTER — Other Ambulatory Visit: Payer: Self-pay

## 2022-02-12 ENCOUNTER — Encounter (HOSPITAL_COMMUNITY): Payer: Self-pay

## 2022-02-12 ENCOUNTER — Emergency Department (HOSPITAL_COMMUNITY): Payer: Medicare HMO

## 2022-02-12 DIAGNOSIS — R0602 Shortness of breath: Secondary | ICD-10-CM | POA: Insufficient documentation

## 2022-02-12 DIAGNOSIS — Z7982 Long term (current) use of aspirin: Secondary | ICD-10-CM | POA: Diagnosis not present

## 2022-02-12 DIAGNOSIS — D72819 Decreased white blood cell count, unspecified: Secondary | ICD-10-CM | POA: Diagnosis not present

## 2022-02-12 DIAGNOSIS — Z7984 Long term (current) use of oral hypoglycemic drugs: Secondary | ICD-10-CM | POA: Diagnosis not present

## 2022-02-12 DIAGNOSIS — Z79899 Other long term (current) drug therapy: Secondary | ICD-10-CM | POA: Diagnosis not present

## 2022-02-12 DIAGNOSIS — I2510 Atherosclerotic heart disease of native coronary artery without angina pectoris: Secondary | ICD-10-CM | POA: Diagnosis not present

## 2022-02-12 DIAGNOSIS — E1165 Type 2 diabetes mellitus with hyperglycemia: Secondary | ICD-10-CM | POA: Diagnosis not present

## 2022-02-12 DIAGNOSIS — Z20822 Contact with and (suspected) exposure to covid-19: Secondary | ICD-10-CM | POA: Insufficient documentation

## 2022-02-12 DIAGNOSIS — D649 Anemia, unspecified: Secondary | ICD-10-CM | POA: Diagnosis not present

## 2022-02-12 DIAGNOSIS — R000 Tachycardia, unspecified: Secondary | ICD-10-CM | POA: Insufficient documentation

## 2022-02-12 DIAGNOSIS — R0902 Hypoxemia: Secondary | ICD-10-CM | POA: Diagnosis not present

## 2022-02-12 DIAGNOSIS — R059 Cough, unspecified: Secondary | ICD-10-CM | POA: Diagnosis not present

## 2022-02-12 DIAGNOSIS — R531 Weakness: Secondary | ICD-10-CM | POA: Insufficient documentation

## 2022-02-12 DIAGNOSIS — Z85118 Personal history of other malignant neoplasm of bronchus and lung: Secondary | ICD-10-CM | POA: Diagnosis not present

## 2022-02-12 LAB — CBC WITH DIFFERENTIAL/PLATELET
Abs Immature Granulocytes: 0.04 10*3/uL (ref 0.00–0.07)
Basophils Absolute: 0 10*3/uL (ref 0.0–0.1)
Basophils Relative: 1 %
Eosinophils Absolute: 0 10*3/uL (ref 0.0–0.5)
Eosinophils Relative: 0 %
HCT: 25.6 % — ABNORMAL LOW (ref 39.0–52.0)
Hemoglobin: 8.5 g/dL — ABNORMAL LOW (ref 13.0–17.0)
Immature Granulocytes: 2 %
Lymphocytes Relative: 32 %
Lymphs Abs: 0.8 10*3/uL (ref 0.7–4.0)
MCH: 26.8 pg (ref 26.0–34.0)
MCHC: 33.2 g/dL (ref 30.0–36.0)
MCV: 80.8 fL (ref 80.0–100.0)
Monocytes Absolute: 0.5 10*3/uL (ref 0.1–1.0)
Monocytes Relative: 20 %
Neutro Abs: 1.2 10*3/uL — ABNORMAL LOW (ref 1.7–7.7)
Neutrophils Relative %: 45 %
Platelets: 253 10*3/uL (ref 150–400)
RBC: 3.17 MIL/uL — ABNORMAL LOW (ref 4.22–5.81)
RDW: 18.6 % — ABNORMAL HIGH (ref 11.5–15.5)
WBC: 2.6 10*3/uL — ABNORMAL LOW (ref 4.0–10.5)
nRBC: 0 % (ref 0.0–0.2)

## 2022-02-12 LAB — COMPREHENSIVE METABOLIC PANEL
ALT: 23 U/L (ref 0–44)
AST: 28 U/L (ref 15–41)
Albumin: 2.6 g/dL — ABNORMAL LOW (ref 3.5–5.0)
Alkaline Phosphatase: 66 U/L (ref 38–126)
Anion gap: 11 (ref 5–15)
BUN: 14 mg/dL (ref 8–23)
CO2: 24 mmol/L (ref 22–32)
Calcium: 9.1 mg/dL (ref 8.9–10.3)
Chloride: 100 mmol/L (ref 98–111)
Creatinine, Ser: 0.97 mg/dL (ref 0.61–1.24)
GFR, Estimated: >60 mL/min (ref >60)
Glucose, Bld: 158 mg/dL — ABNORMAL HIGH (ref 70–99)
Potassium: 3.6 mmol/L (ref 3.5–5.1)
Sodium: 135 mmol/L (ref 135–145)
Total Bilirubin: 0.9 mg/dL (ref 0.3–1.2)
Total Protein: 6.9 g/dL (ref 6.5–8.1)

## 2022-02-12 LAB — LACTIC ACID, PLASMA: Lactic Acid, Venous: 1.7 mmol/L (ref 0.5–1.9)

## 2022-02-12 LAB — RESP PANEL BY RT-PCR (FLU A&B, COVID) ARPGX2
Influenza A by PCR: NEGATIVE
Influenza B by PCR: NEGATIVE
SARS Coronavirus 2 by RT PCR: NEGATIVE

## 2022-02-12 LAB — PROTIME-INR
INR: 1.6 — ABNORMAL HIGH (ref 0.8–1.2)
Prothrombin Time: 18.9 seconds — ABNORMAL HIGH (ref 11.4–15.2)

## 2022-02-12 LAB — APTT: aPTT: 38 seconds — ABNORMAL HIGH (ref 24–36)

## 2022-02-12 MED ORDER — LACTATED RINGERS IV BOLUS (SEPSIS)
1000.0000 mL | Freq: Once | INTRAVENOUS | Status: AC
  Administered 2022-02-12: 1000 mL via INTRAVENOUS

## 2022-02-12 MED ORDER — LACTATED RINGERS IV SOLN: INTRAVENOUS | Status: DC

## 2022-02-12 MED ORDER — LACTATED RINGERS IV BOLUS (SEPSIS)
500.0000 mL | Freq: Once | INTRAVENOUS | Status: AC
  Administered 2022-02-12: 500 mL via INTRAVENOUS

## 2022-02-12 MED ORDER — IOHEXOL 350 MG/ML SOLN
100.0000 mL | Freq: Once | INTRAVENOUS | Status: AC | PRN
  Administered 2022-02-12: 100 mL via INTRAVENOUS

## 2022-02-12 MED ORDER — SODIUM CHLORIDE (PF) 0.9 % IJ SOLN
INTRAMUSCULAR | Status: AC
  Filled 2022-02-12: qty 50

## 2022-02-12 NOTE — ED Triage Notes (Signed)
Pt reports with SHOB and cough x 2 weeks. Pt was dx with PNA but has completed the abts. Over the past 2 days his [facility] has increased and he is coughing up white/yellow mucus. Pt is currently following oncology for his lung cancer.

## 2022-02-12 NOTE — ED Notes (Signed)
Pt placed on 2 L [place].

## 2022-02-12 NOTE — Discharge Instructions (Signed)
You were evaluated in the Emergency Department and after careful evaluation, we did not find any emergent condition requiring admission or further testing in the hospital.  Your work-up today did not show any signs of infection or blood clot in your lungs.  I suspect your symptoms are due to feeling poorly after your chemo and likely dehydration.  Please continue to drink plenty of fluids.  Please call your oncologist today and schedule a close follow-up appointment.  Please return to the Emergency Department if you experience any worsening of your condition.  Thank you for allowing [place] to be a part of your care.

## 2022-02-12 NOTE — Sepsis Progress Note (Signed)
Elink following Code Sepsis   Messaged Provider about ordering abx. Awaiting full workup.

## 2022-02-12 NOTE — ED Provider Notes (Signed)
[facility] DEPT Provider Note   CSN: [id number] Arrival date & time: 02/12/22  4492     History  Chief Complaint  Patient presents with   Shortness of Breath   Cough    KHOA OPDAHL is a 71 y.o. male.  HPI 71 year old male with a history of l squamous cell carcinoma of the right lung chemotherapy, ED, prior stents, MI, DM type II, hyperlipidemia presents to the ER with cough, weakness, shortness of breath and hypoxia.  This has been ongoing for the last 2 weeks.  He was diagnosed with pneumonia and completed antibiotic treatment, however over the last few days he has had increasing shortness of breath and has been coughing up white/yellow mucus.  His last chemo treatment was last Monday.  He denies any lower extremity edema.  No prior history of PE.  Not on anticoagulation.    Home Medications Prior to Admission medications   Medication Sig Start Date End Date Taking? Authorizing Provider  albuterol (VENTOLIN HFA) 108 (90 Base) MCG/ACT inhaler Inhale 1-2 puffs into the lungs every 6 (six) hours as needed (for cough.  okay to fill with albuterol/proair/ventolin.). 11/06/21  Yes [name], MD  aspirin EC 81 MG tablet Take 1 tablet (81 mg total) by mouth daily. Okay to restart this medication on 12/05/2021. 12/04/21  Yes Collene Gobble, MD  benzonatate (TESSALON) 200 MG capsule Take 1 capsule (200 mg total) by mouth 3 (three) times daily as needed. Patient taking differently: Take 200 mg by mouth 3 (three) times daily as needed for cough. 01/11/22  Yes [name], MD  brimonidine (ALPHAGAN P) 0.1 % SOLN Place 1 drop into both eyes 2 (two) times daily.   Yes [provider]  cilostazol (PLETAL) 100 MG tablet Take 1 tablet (100 mg total) by mouth 2 (two) times daily. Okay to restart this medication on 12/05/2021. 12/04/21  Yes Collene Gobble, MD  Cyanocobalamin (VITAMIN B12 PO) Take 1 tablet by mouth daily.   Yes [provider]  dorzolamide-timolol (COSOPT) 22.3-6.8 MG/ML ophthalmic solution Place 1 drop into both eyes 2 (two) times daily.   Yes [provider]  fenofibrate (TRICOR) 145 MG tablet TAKE 1 TABLET BY MOUTH EVERY DAY IN THE EVENING 01/03/22  Yes Adrian Prows, MD  HYDROcodone bit-homatropine (HYCODAN) 5-1.5 MG/5ML syrup Take 5 mLs by mouth every 8 (eight) hours as needed for cough (sedation caution.). 02/04/22  Yes Heilingoetter, [name] L, PA-C  metFORMIN (GLUCOPHAGE) 500 MG tablet Take 1 tablet (500 mg total) by mouth 2 (two) times daily with a meal. 12/04/21  Yes Byrum, Rose Fillers, MD  metoprolol tartrate (LOPRESSOR) 25 MG tablet TAKE 1 TABLET BY MOUTH TWICE A DAY 01/03/22  Yes Adrian Prows, MD  Netarsudil-Latanoprost (ROCKLATAN) 0.02-0.005 % SOLN Apply 1 drop to eye at bedtime. In both eyes   Yes [provider]  nitroGLYCERIN (NITROSTAT) 0.4 MG SL tablet Place 1 tablet (0.4 mg total) under the tongue every 5 (five) minutes as needed for chest pain. 11/06/21  Yes [name], MD  omega-3 acid ethyl esters (LOVAZA) 1 g capsule TAKE 1 CAPSULE BY MOUTH TWICE A DAY 01/03/22  Yes Adrian Prows, MD  prochlorperazine (COMPAZINE) 10 MG tablet Take 1 tablet (10 mg total) by mouth every 6 (six) hours as needed for nausea or vomiting. 02/04/22  Yes Heilingoetter, [name] L, PA-C  rosuvastatin (CRESTOR) 20 MG tablet TAKE 1 TABLET BY MOUTH EVERY DAY 07/30/21  Yes Einar Gip,  Ulice Dash, MD  sucralfate (CARAFATE) 1 g tablet Take 1 tablet (1 g total) by mouth 4 (four) times daily -  with meals and at bedtime. 5 min before meals for radiation induced esophagitis 01/29/22  Yes Tyler Pita, MD  amLODipine-benazepril (LOTREL) 5-20 MG capsule TAKE 1 CAPSULE BY MOUTH EVERY DAY Patient not taking: Reported on 02/12/2022 07/17/21   Adrian Prows, MD  blood glucose meter kit and supplies Dispense based on pt and insurance preference. Use up to 3 times daily as directed. (Dx. E11.9). 09/08/20   [name], MD  Blood Pressure  Monitoring [facility]) MISC Use daily to check BP.  Dx I10 08/15/20   [name], MD  potassium chloride SA (KLOR-CON M) 20 MEQ tablet Take 1 tablet (20 mEq total) by mouth daily. Patient not taking: Reported on 02/12/2022 02/04/22   Heilingoetter, [name] L, PA-C      Allergies    Patient has no known allergies.    Review of Systems   Review of Systems Ten systems reviewed and are negative for acute change, except as noted in the HPI.   Physical Exam Updated Vital Signs BP 129/60   Pulse (!) 118   Temp 98.1 F (36.7 C) (Oral)   Resp 20   Ht 5' 10"  (1.778 m)   Wt 82.6 kg   SpO2 92%   BMI 26.11 kg/m  Physical Exam Vitals and nursing note reviewed.  Constitutional:      General: He is not in acute distress.    Appearance: He is well-developed.     Comments: Pale appearing, ill  HENT:     Head: Normocephalic and atraumatic.  Eyes:     Conjunctiva/sclera: Conjunctivae normal.  Cardiovascular:     Rate and Rhythm: Normal rate and regular rhythm.     Heart sounds: No murmur heard. Pulmonary:     Effort: Pulmonary effort is normal. No respiratory distress.     Breath sounds: Rales present. No decreased breath sounds.     Comments: Scattered rales throughout Abdominal:     Palpations: Abdomen is soft.     Tenderness: There is no abdominal tenderness.  Musculoskeletal:        General: No swelling.     Cervical back: Neck supple.     Right lower leg: No edema.     Left lower leg: No edema.  Skin:    General: Skin is warm and dry.     Capillary Refill: Capillary refill takes less than 2 seconds.  Neurological:     General: No focal deficit present.     Mental Status: He is alert.  Psychiatric:        Mood and Affect: Mood normal.     ED Results / Procedures / Treatments   Labs (all labs ordered are listed, but only abnormal results are displayed) Labs Reviewed  COMPREHENSIVE METABOLIC PANEL - Abnormal; Notable for the following components:      Result  Value   Glucose, Bld 158 (*)    Albumin 2.6 (*)    All other components within normal limits  CBC WITH DIFFERENTIAL/PLATELET - Abnormal; Notable for the following components:   WBC 2.6 (*)    RBC 3.17 (*)    Hemoglobin 8.5 (*)    HCT 25.6 (*)    RDW 18.6 (*)    Neutro Abs 1.2 (*)    All other components within normal limits  PROTIME-INR - Abnormal; Notable for the following components:   Prothrombin Time 18.9 (*)  INR 1.6 (*)    All other components within normal limits  APTT - Abnormal; Notable for the following components:   aPTT 38 (*)    All other components within normal limits  RESP PANEL BY RT-PCR (FLU A&B, COVID) ARPGX2  CULTURE, BLOOD (ROUTINE X 2)  CULTURE, BLOOD (ROUTINE X 2)  URINE CULTURE  LACTIC ACID, PLASMA  LACTIC ACID, PLASMA  LACTIC ACID, PLASMA  URINALYSIS, ROUTINE W REFLEX MICROSCOPIC    EKG None  Radiology CT Angio Chest PE W and/or Wo Contrast  Result Date: 02/12/2022 CLINICAL DATA:  71 year old male history of shortness of breath and cough for 2 weeks. Suspected pulmonary embolism. History of lung cancer status post chemotherapy and radiation therapy (radiation therapy completed 1 week ago). * Tracking Code: BO * EXAM: CT ANGIOGRAPHY CHEST WITH CONTRAST TECHNIQUE: Multidetector CT imaging of the chest was performed using the standard protocol during bolus administration of intravenous contrast. Multiplanar CT image reconstructions and MIPs were obtained to evaluate the vascular anatomy. RADIATION DOSE REDUCTION: This exam was performed according to the departmental dose-optimization program which includes automated exposure control, adjustment of the mA and/or kV according to patient size and/or use of iterative reconstruction technique. CONTRAST:  113m OMNIPAQUE IOHEXOL 350 MG/ML SOLN COMPARISON:  Chest CT 11/09/2021. FINDINGS: Cardiovascular: There are no filling defects within the pulmonary arterial tree to suggest pulmonary embolism. Heart size is  normal. There is no significant pericardial fluid, thickening or pericardial calcification. There is aortic atherosclerosis, as well as atherosclerosis of the great vessels of the mediastinum and the coronary arteries, including calcified atherosclerotic plaque in the left main, left anterior descending, left circumflex and right coronary arteries. Status post median sternotomy for CABG including LIMA to the LAD. Mediastinum/Nodes: No pathologically enlarged mediastinal or hilar lymph nodes. Esophagus is unremarkable in appearance. No axillary lymphadenopathy. Lungs/Pleura: Previously noted right lower lobe mass is now centrally cavitary with thickened irregular walls estimated to measure approximately 7.3 x 6.1 x 6.5 cm on today's examination (axial image 79 of series 4 and coronal image 100 of series 7). Surrounding areas of ground-glass attenuation, septal thickening, thickening of the peribronchovascular interstitium and regional architectural distortion are noted on today's study, likely to reflect a combination of chronic post obstructive changes and evolving postradiation changes. No pleural effusions. Upper Abdomen: Aortic atherosclerosis. Musculoskeletal: Median sternotomy wires. There are no aggressive appearing lytic or blastic lesions noted in the visualized portions of the skeleton. Review of the MIP images confirms the above findings. IMPRESSION: 1. No evidence of pulmonary embolism. 2. Interval cavitation of previously noted large right lower lobe mass with what appears to be evolving postobstructive and postradiation changes in the surrounding lung parenchyma, as above. 3. Aortic atherosclerosis, in addition to left main and three-vessel coronary artery disease. Status post median sternotomy for CABG including LIMA to the LAD. Aortic Atherosclerosis (ICD10-I70.0). Electronically Signed   By: DVinnie LangtonM.D.   On: 02/12/2022 05:46   DG Chest Port 1 View  Result Date: 02/12/2022 CLINICAL  DATA:  Shortness of breath, cough EXAM: PORTABLE CHEST 1 VIEW COMPARISON:  02/04/2022 FINDINGS: Improving cavitary lesion in the central right lower lobe, corresponding to the patient's known primary bronchogenic neoplasm. Left lung is clear.  No pleural effusion or pneumothorax. The heart is normal in size. Postsurgical changes related to prior CABG. Median sternotomy. IMPRESSION: Improving cavitary lesion in the central right lower lobe, corresponding to the patient's known primary bronchogenic neoplasm. No evidence of acute cardiopulmonary disease. Electronically Signed  By: Julian Hy M.D.   On: 02/12/2022 03:11    Procedures Procedures    Medications Ordered in ED Medications  lactated ringers infusion ( Intravenous New Bag/Given 02/12/22 0438)  sodium chloride (PF) 0.9 % injection (has no administration in time range)  lactated ringers bolus 1,000 mL (0 mLs Intravenous Stopped 02/12/22 0439)    And  lactated ringers bolus 1,000 mL (0 mLs Intravenous Stopped 02/12/22 0439)    And  lactated ringers bolus 500 mL (0 mLs Intravenous Stopped 02/12/22 0439)  iohexol (OMNIPAQUE) 350 MG/ML injection 100 mL (100 mLs Intravenous Contrast Given 02/12/22 0454)    ED Course/ Medical Decision Making/ A&P                           Medical Decision Making Amount and/or Complexity of Data Reviewed Labs: ordered. Radiology: ordered.  Risk Prescription drug management.   This patient presents to the ED for concern of shortness of breath this involves a number of treatment options, and is a complaint that carries with it a high risk of complications and morbidity.  The differential diagnosis includes pneumonia, PE, pleural effusion, ACS, lung cancer complication   Co morbidities: Discussed in HPI   Brief History:  71 year old male presenting with shortness of breath, worsening over the last 2 weeks.  He was treated for pneumonia and completed antibiotic treatment however has progressively  felt more short of breath and has been coughing up white mucus.  He arrived to the ER hypotensive with a blood pressure of 87/60, improved with fluids.  He was afebrile, but consistently tachycardic in the 120s.  He was 98% on room air.  Given recent pneumonia and hypotension, code sepsis initiated.  EMR reviewed including pt PMHx, past surgical history and past visits to ER.   See HPI for more details   Lab Tests:  I ordered and independently interpreted labs.  The pertinent results include:    Labs notable for Leukopenia of 2.6, hemoglobin 8.5 which is stable.  CMP without any significant electrode abnormalities, glucose of 158, normal LFTs.  Lactic acid normal of 1.7.  COVID and flu are negative.  Blood culture sent.  Imaging Studies:  Abnormal findings. I personally reviewed all imaging studies. Imaging notable for X-ray with improving cavitary lesion but no evidence of pneumonia. CT PE study:  1. No evidence of pulmonary embolism.  2. Interval cavitation of previously noted large right lower lobe  mass with what appears to be evolving postobstructive and  postradiation changes in the surrounding lung parenchyma, as above.  3. Aortic atherosclerosis, in addition to left main and three-vessel  coronary artery disease. Status post median sternotomy for CABG  including LIMA to the LAD.    Cardiac Monitoring:  The patient was maintained on a cardiac monitor.  I personally viewed and interpreted the cardiac monitored which showed an underlying rhythm of: EKG non-ischemic, consistent with sinus tachycardia   Medicines ordered:  I ordered medication including IV fluids, Reevaluation of the patient after these medicines showed that the patient improved I have reviewed the patients home medicines and have made adjustments as needed   Critical Interventions: Sepsis protocol fluids    Consults:  Not indicated    Reevaluation:  After the interventions noted above I  re-evaluated patient and found that they have :improved   Social Determinants of Health:  None   Problem List / ED Course:  71 year old male presenting with hypotension weakness, recently  treated for pneumonia.  On arrival he was tachycardic with a rate of 122, hypotensive with a blood pressure of 87/60.  Afebrile.  Blood pressure improved with fluids.  Tachycardia although not completely resolved also improved.  His labs show a leukopenia of 2.6 and hemoglobin of 8.5, slightly lower than prior.  Patient denies any active bleeding.  CMP without any significant electrode abnormalities, normal LFTs.  Lactic acid is normal.  COVID and flu are negative.  His PE study does not show any evidence of pneumonia no evidence of PE.  Was given 2 and half liters of fluid and notes some improvement.  He was ambulated and remained with sats above 92% on room air.  The patient's wife at bedside reports that he has been having issues with eating secondary to his chemo.  She states he drinks Gatorade at home and has gotten some fluids with oncology last week but suspects that he is probably not drinking enough water.  I suspect that this may be the source of his hypotension.  No indication for antibiotic treatment at this time.  I encouraged the patient to call his oncologist today and have close follow-up.  He and his wife at bedside voiced understanding and are agreeable.  Stable for discharge.   Dispostion:  After consideration of the diagnostic results and the patients response to treatment, I feel that the patent would benefit from discharge with close oncology follow-up.  I considered admission however given hypotension and resolved with fluids, no evidence of sepsis or infection, no PE, and patient ambulating well no indication for admission at this time.  This was a shared visit with my supervising physician Dr. Sedonia Small who independently saw and evaluated the patient & provided guidance in  evaluation/management/disposition ,in agreement with care    Final Clinical Impression(s) / ED Diagnoses Final diagnoses:  Shortness of breath    Rx / DC Orders ED Discharge Orders     None         Lyndel Safe 02/12/22 0615    Maudie Flakes, MD 02/12/22 [phone]

## 2022-02-13 ENCOUNTER — Telehealth: Payer: Self-pay

## 2022-02-13 ENCOUNTER — Other Ambulatory Visit: Payer: Self-pay | Admitting: Physician Assistant

## 2022-02-13 DIAGNOSIS — R630 Anorexia: Secondary | ICD-10-CM

## 2022-02-13 MED ORDER — METHYLPREDNISOLONE 4 MG PO TBPK: ORAL_TABLET | ORAL | 0 refills | Status: DC

## 2022-02-14 ENCOUNTER — Encounter: Payer: Self-pay | Admitting: Internal Medicine

## 2022-02-14 NOTE — Telephone Encounter (Signed)
Pts wife LM requesting something to make the pt eat and drink because he is worries he will get dehydrated again. Discussed with Dr. Julien Nordmann who advised pt has completed tx and it will take some time for the discomfort from radiation to lessen and pt is strongly encouraged to use the rx's given to him, which include, albuterol, tessalon, carafate, hycodan, and compazine. In addition to this, a medrol dosepak has been prescribed. Pts wife has expressed understanding of this information.

## 2022-02-15 ENCOUNTER — Telehealth: Payer: Self-pay

## 2022-02-15 NOTE — Chronic Care Management (AMB) (Signed)
Chronic Care Management Pharmacy Assistant   Name: Jorge Adams  MRN: [id number] DOB: [birth year]  Reason for Encounter: Hospital Follow Up   Medications: Outpatient Encounter Medications as of 02/15/2022  Medication Sig   albuterol (VENTOLIN HFA) 108 (90 Base) MCG/ACT inhaler Inhale 1-2 puffs into the lungs every 6 (six) hours as needed (for cough.  okay to fill with albuterol/proair/ventolin.).   amLODipine-benazepril (LOTREL) 5-20 MG capsule TAKE 1 CAPSULE BY MOUTH EVERY DAY (Patient not taking: Reported on 02/12/2022)   aspirin EC 81 MG tablet Take 1 tablet (81 mg total) by mouth daily. Okay to restart this medication on 12/05/2021.   benzonatate (TESSALON) 200 MG capsule Take 1 capsule (200 mg total) by mouth 3 (three) times daily as needed. (Patient taking differently: Take 200 mg by mouth 3 (three) times daily as needed for cough.)   blood glucose meter kit and supplies Dispense based on pt and insurance preference. Use up to 3 times daily as directed. (Dx. E11.9).   Blood Pressure Monitoring (SPHYGMOMANOMETER) MISC Use daily to check BP.  Dx I10   brimonidine (ALPHAGAN P) 0.1 % SOLN Place 1 drop into both eyes 2 (two) times daily.   cilostazol (PLETAL) 100 MG tablet Take 1 tablet (100 mg total) by mouth 2 (two) times daily. Okay to restart this medication on 12/05/2021.   Cyanocobalamin (VITAMIN B12 PO) Take 1 tablet by mouth daily.   dorzolamide-timolol (COSOPT) 22.3-6.8 MG/ML ophthalmic solution Place 1 drop into both eyes 2 (two) times daily.   fenofibrate (TRICOR) 145 MG tablet TAKE 1 TABLET BY MOUTH EVERY DAY IN THE EVENING   HYDROcodone bit-homatropine (HYCODAN) 5-1.5 MG/5ML syrup Take 5 mLs by mouth every 8 (eight) hours as needed for cough (sedation caution.).   metFORMIN (GLUCOPHAGE) 500 MG tablet Take 1 tablet (500 mg total) by mouth 2 (two) times daily with a meal.   methylPREDNISolone (MEDROL DOSEPAK) 4 MG TBPK tablet Use as instructed   metoprolol tartrate  (LOPRESSOR) 25 MG tablet TAKE 1 TABLET BY MOUTH TWICE A DAY   Netarsudil-Latanoprost (ROCKLATAN) 0.02-0.005 % SOLN Apply 1 drop to eye at bedtime. In both eyes   nitroGLYCERIN (NITROSTAT) 0.4 MG SL tablet Place 1 tablet (0.4 mg total) under the tongue every 5 (five) minutes as needed for chest pain.   omega-3 acid ethyl esters (LOVAZA) 1 g capsule TAKE 1 CAPSULE BY MOUTH TWICE A DAY   potassium chloride SA (KLOR-CON M) 20 MEQ tablet Take 1 tablet (20 mEq total) by mouth daily. (Patient not taking: Reported on 02/12/2022)   prochlorperazine (COMPAZINE) 10 MG tablet Take 1 tablet (10 mg total) by mouth every 6 (six) hours as needed for nausea or vomiting.   rosuvastatin (CRESTOR) 20 MG tablet TAKE 1 TABLET BY MOUTH EVERY DAY   sucralfate (CARAFATE) 1 g tablet Take 1 tablet (1 g total) by mouth 4 (four) times daily -  with meals and at bedtime. 5 min before meals for radiation induced esophagitis   No facility-administered encounter medications on file as of 02/15/2022.      Reviewed hospital notes for details of recent visit. Patient has been contacted by Transitions of Care team: No  Admitted to the ED on 02/12/22. Discharge date was 02/12/22. Discharged from [facility].   Discharge diagnosis (Principal Problem): shortness of breath  Patient was discharged to Home  Brief summary of hospital course: 71 year old male presenting with hypotension weakness, recently treated for pneumonia.  On arrival he was tachycardic with  a rate of 122, hypotensive with a blood pressure of 87/60.  Afebrile.  Blood pressure improved with fluids.  Tachycardia although not completely resolved also improved.  His labs show a leukopenia of 2.6 and hemoglobin of 8.5, slightly lower than prior.  Patient denies any active bleeding.  CMP without any significant electrode abnormalities, normal LFTs.  Lactic acid is normal.  COVID and flu are negative.  His PE study does not show any evidence of pneumonia no evidence of  PE.  Was given 2 and half liters of fluid and notes some improvement.  He was ambulated and remained with sats above 92% on room air.  The patient's wife at bedside reports that he has been having issues with eating secondary to his chemo.  She states he drinks Gatorade at home and has gotten some fluids with oncology last week but suspects that he is probably not drinking enough water.  I suspect that this may be the source of his hypotension.  No indication for antibiotic treatment at this time.  I encouraged the patient to call his oncologist today and have close follow-up.  He and his wife at bedside voiced understanding and are agreeable.  Stable for discharge.  Medications that remain the same after Hospital Discharge:??  -All other medications will remain the same.    Next CCM appt: 03/07/22 Other upcoming appts: Cardiology appointment on 04/19/22  Charlene Brooke, PharmD notified and will determine if action is needed.  Charlene Brooke, CPP notified  [name], [place]  [phone]

## 2022-02-17 LAB — CULTURE, BLOOD (ROUTINE X 2)
Culture: NO GROWTH
Culture: NO GROWTH
Special Requests: ADEQUATE

## 2022-02-18 ENCOUNTER — Telehealth: Payer: Self-pay

## 2022-02-18 ENCOUNTER — Other Ambulatory Visit: Payer: Self-pay | Admitting: Internal Medicine

## 2022-02-18 DIAGNOSIS — C3491 Malignant neoplasm of unspecified part of right bronchus or lung: Secondary | ICD-10-CM

## 2022-02-18 DIAGNOSIS — R059 Cough, unspecified: Secondary | ICD-10-CM

## 2022-02-18 MED ORDER — HYDROCODONE BIT-HOMATROP MBR 5-1.5 MG/5ML PO SOLN: 5.0000 mL | Freq: Three times a day (TID) | ORAL | 0 refills | Status: DC | PRN

## 2022-02-18 NOTE — Telephone Encounter (Signed)
Wife called requesting refill on Hycodan cough syrup

## 2022-02-19 ENCOUNTER — Other Ambulatory Visit: Payer: Self-pay

## 2022-02-22 ENCOUNTER — Emergency Department (HOSPITAL_COMMUNITY): Payer: Medicare HMO

## 2022-02-22 ENCOUNTER — Telehealth: Payer: Self-pay | Admitting: Medical Oncology

## 2022-02-22 ENCOUNTER — Other Ambulatory Visit: Payer: Self-pay

## 2022-02-22 ENCOUNTER — Encounter (HOSPITAL_COMMUNITY): Payer: Self-pay

## 2022-02-22 ENCOUNTER — Inpatient Hospital Stay (HOSPITAL_COMMUNITY)
Admission: EM | Admit: 2022-02-22 | Discharge: 2022-02-25 | DRG: 194 | Disposition: A | Discharge status: Home / Self Care | Payer: Medicare HMO | Attending: Family Medicine | Admitting: Family Medicine

## 2022-02-22 DIAGNOSIS — I10 Essential (primary) hypertension: Secondary | ICD-10-CM | POA: Diagnosis not present

## 2022-02-22 DIAGNOSIS — E1169 Type 2 diabetes mellitus with other specified complication: Secondary | ICD-10-CM | POA: Diagnosis present

## 2022-02-22 DIAGNOSIS — Z20822 Contact with and (suspected) exposure to covid-19: Secondary | ICD-10-CM | POA: Diagnosis present

## 2022-02-22 DIAGNOSIS — Y95 Nosocomial condition: Secondary | ICD-10-CM | POA: Diagnosis present

## 2022-02-22 DIAGNOSIS — Z9221 Personal history of antineoplastic chemotherapy: Secondary | ICD-10-CM

## 2022-02-22 DIAGNOSIS — K208 Other esophagitis without bleeding: Secondary | ICD-10-CM | POA: Diagnosis present

## 2022-02-22 DIAGNOSIS — I771 Stricture of artery: Secondary | ICD-10-CM | POA: Diagnosis not present

## 2022-02-22 DIAGNOSIS — I152 Hypertension secondary to endocrine disorders: Secondary | ICD-10-CM | POA: Diagnosis present

## 2022-02-22 DIAGNOSIS — Z87891 Personal history of nicotine dependence: Secondary | ICD-10-CM

## 2022-02-22 DIAGNOSIS — I959 Hypotension, unspecified: Secondary | ICD-10-CM | POA: Diagnosis present

## 2022-02-22 DIAGNOSIS — Z825 Family history of asthma and other chronic lower respiratory diseases: Secondary | ICD-10-CM

## 2022-02-22 DIAGNOSIS — Z981 Arthrodesis status: Secondary | ICD-10-CM

## 2022-02-22 DIAGNOSIS — I252 Old myocardial infarction: Secondary | ICD-10-CM

## 2022-02-22 DIAGNOSIS — C3491 Malignant neoplasm of unspecified part of right bronchus or lung: Secondary | ICD-10-CM | POA: Diagnosis not present

## 2022-02-22 DIAGNOSIS — R0902 Hypoxemia: Secondary | ICD-10-CM | POA: Diagnosis not present

## 2022-02-22 DIAGNOSIS — Z79899 Other long term (current) drug therapy: Secondary | ICD-10-CM

## 2022-02-22 DIAGNOSIS — J189 Pneumonia, unspecified organism: Principal | ICD-10-CM | POA: Diagnosis present

## 2022-02-22 DIAGNOSIS — R1310 Dysphagia, unspecified: Secondary | ICD-10-CM | POA: Diagnosis not present

## 2022-02-22 DIAGNOSIS — R000 Tachycardia, unspecified: Secondary | ICD-10-CM | POA: Diagnosis not present

## 2022-02-22 DIAGNOSIS — H409 Unspecified glaucoma: Secondary | ICD-10-CM | POA: Diagnosis present

## 2022-02-22 DIAGNOSIS — E860 Dehydration: Secondary | ICD-10-CM | POA: Diagnosis present

## 2022-02-22 DIAGNOSIS — D649 Anemia, unspecified: Secondary | ICD-10-CM

## 2022-02-22 DIAGNOSIS — Y842 Radiological procedure and radiotherapy as the cause of abnormal reaction of the patient, or of later complication, without mention of misadventure at the time of the procedure: Secondary | ICD-10-CM | POA: Diagnosis present

## 2022-02-22 DIAGNOSIS — E871 Hypo-osmolality and hyponatremia: Secondary | ICD-10-CM | POA: Diagnosis not present

## 2022-02-22 DIAGNOSIS — C3490 Malignant neoplasm of unspecified part of unspecified bronchus or lung: Secondary | ICD-10-CM | POA: Diagnosis not present

## 2022-02-22 DIAGNOSIS — I428 Other cardiomyopathies: Secondary | ICD-10-CM | POA: Diagnosis not present

## 2022-02-22 DIAGNOSIS — M1990 Unspecified osteoarthritis, unspecified site: Secondary | ICD-10-CM | POA: Diagnosis present

## 2022-02-22 DIAGNOSIS — L8990 Pressure ulcer of unspecified site, unspecified stage: Secondary | ICD-10-CM | POA: Insufficient documentation

## 2022-02-22 DIAGNOSIS — Z923 Personal history of irradiation: Secondary | ICD-10-CM

## 2022-02-22 DIAGNOSIS — E1159 Type 2 diabetes mellitus with other circulatory complications: Secondary | ICD-10-CM | POA: Diagnosis present

## 2022-02-22 DIAGNOSIS — A419 Sepsis, unspecified organism: Principal | ICD-10-CM | POA: Diagnosis present

## 2022-02-22 DIAGNOSIS — K229 Disease of esophagus, unspecified: Secondary | ICD-10-CM | POA: Diagnosis not present

## 2022-02-22 DIAGNOSIS — I739 Peripheral vascular disease, unspecified: Secondary | ICD-10-CM | POA: Diagnosis present

## 2022-02-22 DIAGNOSIS — J432 Centrilobular emphysema: Secondary | ICD-10-CM | POA: Diagnosis not present

## 2022-02-22 DIAGNOSIS — Z951 Presence of aortocoronary bypass graft: Secondary | ICD-10-CM

## 2022-02-22 DIAGNOSIS — E876 Hypokalemia: Secondary | ICD-10-CM | POA: Diagnosis present

## 2022-02-22 DIAGNOSIS — K8020 Calculus of gallbladder without cholecystitis without obstruction: Secondary | ICD-10-CM | POA: Diagnosis not present

## 2022-02-22 DIAGNOSIS — J181 Lobar pneumonia, unspecified organism: Secondary | ICD-10-CM | POA: Diagnosis present

## 2022-02-22 DIAGNOSIS — H40059 Ocular hypertension, unspecified eye: Secondary | ICD-10-CM | POA: Diagnosis present

## 2022-02-22 DIAGNOSIS — R0602 Shortness of breath: Secondary | ICD-10-CM | POA: Diagnosis not present

## 2022-02-22 DIAGNOSIS — Z833 Family history of diabetes mellitus: Secondary | ICD-10-CM

## 2022-02-22 DIAGNOSIS — Z85118 Personal history of other malignant neoplasm of bronchus and lung: Secondary | ICD-10-CM

## 2022-02-22 DIAGNOSIS — I2510 Atherosclerotic heart disease of native coronary artery without angina pectoris: Secondary | ICD-10-CM | POA: Diagnosis present

## 2022-02-22 DIAGNOSIS — Z7984 Long term (current) use of oral hypoglycemic drugs: Secondary | ICD-10-CM

## 2022-02-22 DIAGNOSIS — Z808 Family history of malignant neoplasm of other organs or systems: Secondary | ICD-10-CM

## 2022-02-22 DIAGNOSIS — E1151 Type 2 diabetes mellitus with diabetic peripheral angiopathy without gangrene: Secondary | ICD-10-CM | POA: Diagnosis present

## 2022-02-22 DIAGNOSIS — Z8249 Family history of ischemic heart disease and other diseases of the circulatory system: Secondary | ICD-10-CM | POA: Diagnosis not present

## 2022-02-22 DIAGNOSIS — R0689 Other abnormalities of breathing: Secondary | ICD-10-CM | POA: Diagnosis not present

## 2022-02-22 DIAGNOSIS — E785 Hyperlipidemia, unspecified: Secondary | ICD-10-CM | POA: Diagnosis not present

## 2022-02-22 DIAGNOSIS — R161 Splenomegaly, not elsewhere classified: Secondary | ICD-10-CM | POA: Diagnosis not present

## 2022-02-22 DIAGNOSIS — J929 Pleural plaque without asbestos: Secondary | ICD-10-CM | POA: Diagnosis not present

## 2022-02-22 DIAGNOSIS — Z7982 Long term (current) use of aspirin: Secondary | ICD-10-CM

## 2022-02-22 DIAGNOSIS — R231 Pallor: Secondary | ICD-10-CM | POA: Diagnosis not present

## 2022-02-22 LAB — COMPREHENSIVE METABOLIC PANEL
ALT: 22 U/L (ref 0–44)
AST: 27 U/L (ref 15–41)
Albumin: 2 g/dL — ABNORMAL LOW (ref 3.5–5.0)
Alkaline Phosphatase: 65 U/L (ref 38–126)
Anion gap: 8 (ref 5–15)
BUN: 10 mg/dL (ref 8–23)
CO2: 23 mmol/L (ref 22–32)
Calcium: 7.7 mg/dL — ABNORMAL LOW (ref 8.9–10.3)
Chloride: 101 mmol/L (ref 98–111)
Creatinine, Ser: 0.81 mg/dL (ref 0.61–1.24)
GFR, Estimated: >60 mL/min (ref >60)
Glucose, Bld: 212 mg/dL — ABNORMAL HIGH (ref 70–99)
Potassium: 4 mmol/L (ref 3.5–5.1)
Sodium: 132 mmol/L — ABNORMAL LOW (ref 135–145)
Total Bilirubin: 0.9 mg/dL (ref 0.3–1.2)
Total Protein: 5.9 g/dL — ABNORMAL LOW (ref 6.5–8.1)

## 2022-02-22 LAB — CBC WITH DIFFERENTIAL/PLATELET
Abs Immature Granulocytes: 0.08 10*3/uL — ABNORMAL HIGH (ref 0.00–0.07)
Basophils Absolute: 0 10*3/uL (ref 0.0–0.1)
Basophils Relative: 0 %
Eosinophils Absolute: 0 10*3/uL (ref 0.0–0.5)
Eosinophils Relative: 0 %
HCT: 24 % — ABNORMAL LOW (ref 39.0–52.0)
Hemoglobin: 7.8 g/dL — ABNORMAL LOW (ref 13.0–17.0)
Immature Granulocytes: 1 %
Lymphocytes Relative: 13 %
Lymphs Abs: 0.8 10*3/uL (ref 0.7–4.0)
MCH: 26.5 pg (ref 26.0–34.0)
MCHC: 32.5 g/dL (ref 30.0–36.0)
MCV: 81.6 fL (ref 80.0–100.0)
Monocytes Absolute: 0.4 10*3/uL (ref 0.1–1.0)
Monocytes Relative: 6 %
Neutro Abs: 5 10*3/uL (ref 1.7–7.7)
Neutrophils Relative %: 80 %
Platelets: 164 10*3/uL (ref 150–400)
RBC: 2.94 MIL/uL — ABNORMAL LOW (ref 4.22–5.81)
RDW: 20.4 % — ABNORMAL HIGH (ref 11.5–15.5)
WBC: 6.2 10*3/uL (ref 4.0–10.5)
nRBC: 0 % (ref 0.0–0.2)

## 2022-02-22 LAB — IRON AND TIBC
Iron: 25 ug/dL — ABNORMAL LOW (ref 45–182)
Saturation Ratios: 20 % (ref 17.9–39.5)
TIBC: 126 ug/dL — ABNORMAL LOW (ref 250–450)
UIBC: 101 ug/dL

## 2022-02-22 LAB — LACTIC ACID, PLASMA
Lactic Acid, Venous: 0.8 mmol/L (ref 0.5–1.9)
Lactic Acid, Venous: 0.8 mmol/L (ref 0.5–1.9)

## 2022-02-22 LAB — RESP PANEL BY RT-PCR (FLU A&B, COVID) ARPGX2
Influenza A by PCR: NEGATIVE
Influenza B by PCR: NEGATIVE
SARS Coronavirus 2 by RT PCR: NEGATIVE

## 2022-02-22 LAB — GLUCOSE, CAPILLARY
Glucose-Capillary: 137 mg/dL — ABNORMAL HIGH (ref 70–99)
Glucose-Capillary: 151 mg/dL — ABNORMAL HIGH (ref 70–99)

## 2022-02-22 LAB — TROPONIN I (HIGH SENSITIVITY)
Troponin I (High Sensitivity): 12 ng/L (ref <18)
Troponin I (High Sensitivity): 14 ng/L (ref <18)

## 2022-02-22 LAB — HEMOGLOBIN AND HEMATOCRIT, BLOOD
HCT: 22 % — ABNORMAL LOW (ref 39.0–52.0)
Hemoglobin: 7.1 g/dL — ABNORMAL LOW (ref 13.0–17.0)

## 2022-02-22 LAB — HEMOGLOBIN A1C
Hgb A1c MFr Bld: 6.6 % — ABNORMAL HIGH (ref 4.8–5.6)
Mean Plasma Glucose: 142.72 mg/dL

## 2022-02-22 LAB — BRAIN NATRIURETIC PEPTIDE: B Natriuretic Peptide: 65.3 pg/mL (ref 0.0–100.0)

## 2022-02-22 LAB — PREALBUMIN: Prealbumin: <5 mg/dL — ABNORMAL LOW (ref 18–38)

## 2022-02-22 MED ORDER — METOPROLOL TARTRATE 5 MG/5ML IV SOLN
2.5000 mg | Freq: Once | INTRAVENOUS | Status: DC
  Filled 2022-02-22: qty 5

## 2022-02-22 MED ORDER — PANTOPRAZOLE SODIUM 40 MG IV SOLR
40.0000 mg | INTRAVENOUS | Status: DC
  Administered 2022-02-22 – 2022-02-24 (×3): 40 mg via INTRAVENOUS
  Filled 2022-02-22 (×3): qty 10

## 2022-02-22 MED ORDER — HYDROCODONE BIT-HOMATROP MBR 5-1.5 MG/5ML PO SOLN
5.0000 mL | Freq: Three times a day (TID) | ORAL | Status: DC | PRN
  Administered 2022-02-22 – 2022-02-25 (×2): 5 mL via ORAL
  Filled 2022-02-22 (×2): qty 5

## 2022-02-22 MED ORDER — PROCHLORPERAZINE EDISYLATE 10 MG/2ML IJ SOLN
10.0000 mg | Freq: Four times a day (QID) | INTRAMUSCULAR | Status: DC | PRN
  Administered 2022-02-23 – 2022-02-25 (×2): 10 mg via INTRAVENOUS
  Filled 2022-02-22 (×2): qty 2

## 2022-02-22 MED ORDER — VANCOMYCIN HCL 1500 MG/300ML IV SOLN
1500.0000 mg | Freq: Once | INTRAVENOUS | Status: AC
  Administered 2022-02-22: 1500 mg via INTRAVENOUS
  Filled 2022-02-22: qty 300

## 2022-02-22 MED ORDER — INSULIN ASPART 100 UNIT/ML IJ SOLN
0.0000 [IU] | Freq: Three times a day (TID) | INTRAMUSCULAR | Status: DC
  Administered 2022-02-23 – 2022-02-24 (×3): 1 [IU] via SUBCUTANEOUS
  Administered 2022-02-24: 2 [IU] via SUBCUTANEOUS
  Administered 2022-02-25: 1 [IU] via SUBCUTANEOUS

## 2022-02-22 MED ORDER — VANCOMYCIN HCL IN DEXTROSE 1-5 GM/200ML-% IV SOLN
1000.0000 mg | Freq: Two times a day (BID) | INTRAVENOUS | Status: DC
Start: 2022-02-23 — End: 2022-02-24
  Administered 2022-02-23 – 2022-02-24 (×3): 1000 mg via INTRAVENOUS
  Filled 2022-02-22 (×3): qty 200

## 2022-02-22 MED ORDER — LACTATED RINGERS IV BOLUS
1000.0000 mL | Freq: Once | INTRAVENOUS | Status: AC
  Administered 2022-02-22: 1000 mL via INTRAVENOUS

## 2022-02-22 MED ORDER — IOHEXOL 300 MG/ML  SOLN
100.0000 mL | Freq: Once | INTRAMUSCULAR | Status: AC | PRN
  Administered 2022-02-22: 100 mL via INTRAVENOUS

## 2022-02-22 MED ORDER — ASPIRIN 81 MG PO TBEC
81.0000 mg | DELAYED_RELEASE_TABLET | Freq: Every day | ORAL | Status: DC
  Administered 2022-02-22 – 2022-02-25 (×4): 81 mg via ORAL
  Filled 2022-02-22 (×5): qty 1

## 2022-02-22 MED ORDER — SODIUM CHLORIDE 0.9 % IV SOLN: 2.0000 g | Freq: Once | INTRAVENOUS | Status: DC

## 2022-02-22 MED ORDER — ACETAMINOPHEN 325 MG PO TABS
650.0000 mg | ORAL_TABLET | Freq: Four times a day (QID) | ORAL | Status: DC | PRN
  Administered 2022-02-22 – 2022-02-25 (×2): 650 mg via ORAL
  Filled 2022-02-22 (×2): qty 2

## 2022-02-22 MED ORDER — FENOFIBRATE 160 MG PO TABS
160.0000 mg | ORAL_TABLET | Freq: Every day | ORAL | Status: DC
  Administered 2022-02-22 – 2022-02-24 (×3): 160 mg via ORAL
  Filled 2022-02-22 (×4): qty 1

## 2022-02-22 MED ORDER — NETARSUDIL-LATANOPROST 0.02-0.005 % OP SOLN
1.0000 [drp] | Freq: Every day | OPHTHALMIC | Status: DC
Start: 2022-02-22 — End: 2022-02-25
  Administered 2022-02-22 – 2022-02-24 (×3): 1 [drp] via OPHTHALMIC

## 2022-02-22 MED ORDER — CEFEPIME HCL 2 G IV SOLR
2.0000 g | Freq: Three times a day (TID) | INTRAVENOUS | Status: DC
  Administered 2022-02-22 – 2022-02-25 (×8): 2 g via INTRAVENOUS
  Filled 2022-02-22 (×8): qty 12.5

## 2022-02-22 MED ORDER — DORZOLAMIDE HCL-TIMOLOL MAL 2-0.5 % OP SOLN
1.0000 [drp] | Freq: Two times a day (BID) | OPHTHALMIC | Status: DC
  Administered 2022-02-22 – 2022-02-25 (×6): 1 [drp] via OPHTHALMIC
  Filled 2022-02-22: qty 10

## 2022-02-22 MED ORDER — ROSUVASTATIN CALCIUM 20 MG PO TABS
20.0000 mg | ORAL_TABLET | Freq: Every day | ORAL | Status: DC
  Administered 2022-02-22 – 2022-02-25 (×4): 20 mg via ORAL
  Filled 2022-02-22 (×5): qty 1

## 2022-02-22 MED ORDER — ENOXAPARIN SODIUM 40 MG/0.4ML IJ SOSY
40.0000 mg | PREFILLED_SYRINGE | INTRAMUSCULAR | Status: DC
  Administered 2022-02-22 – 2022-02-24 (×3): 40 mg via SUBCUTANEOUS
  Filled 2022-02-22 (×3): qty 0.4

## 2022-02-22 MED ORDER — SODIUM CHLORIDE (PF) 0.9 % IJ SOLN
INTRAMUSCULAR | Status: AC
  Filled 2022-02-22: qty 50

## 2022-02-22 MED ORDER — FENOFIBRATE 160 MG PO TABS
160.0000 mg | ORAL_TABLET | Freq: Every day | ORAL | Status: DC
  Filled 2022-02-22: qty 1

## 2022-02-22 MED ORDER — IPRATROPIUM-ALBUTEROL 0.5-2.5 (3) MG/3ML IN SOLN
3.0000 mL | Freq: Four times a day (QID) | RESPIRATORY_TRACT | Status: DC
  Administered 2022-02-22 – 2022-02-23 (×4): 3 mL via RESPIRATORY_TRACT
  Filled 2022-02-22 (×4): qty 3

## 2022-02-22 MED ORDER — CILOSTAZOL 100 MG PO TABS
100.0000 mg | ORAL_TABLET | Freq: Two times a day (BID) | ORAL | Status: DC
  Administered 2022-02-22 – 2022-02-25 (×6): 100 mg via ORAL
  Filled 2022-02-22 (×8): qty 1

## 2022-02-22 MED ORDER — ALBUMIN HUMAN 25 % IV SOLN
25.0000 g | Freq: Once | INTRAVENOUS | Status: AC
Start: 2022-02-22 — End: 2022-02-23
  Administered 2022-02-22: 25 g via INTRAVENOUS
  Filled 2022-02-22: qty 100

## 2022-02-22 MED ORDER — SODIUM CHLORIDE 0.9 % IV SOLN
2.0000 g | Freq: Once | INTRAVENOUS | Status: AC
  Administered 2022-02-22: 2 g via INTRAVENOUS
  Filled 2022-02-22: qty 12.5

## 2022-02-22 MED ORDER — LACTATED RINGERS IV SOLN
INTRAVENOUS | Status: DC
Start: 2022-02-22 — End: 2022-02-24

## 2022-02-22 MED ORDER — BRIMONIDINE TARTRATE 0.15 % OP SOLN
1.0000 [drp] | Freq: Two times a day (BID) | OPHTHALMIC | Status: DC
  Administered 2022-02-22 – 2022-02-25 (×6): 1 [drp] via OPHTHALMIC
  Filled 2022-02-22: qty 5

## 2022-02-22 MED ORDER — MELATONIN 3 MG PO TABS
3.0000 mg | ORAL_TABLET | Freq: Once | ORAL | Status: AC
  Administered 2022-02-22: 3 mg via ORAL
  Filled 2022-02-22: qty 1

## 2022-02-22 NOTE — ED Provider Notes (Signed)
[facility] DEPT Provider Note  CSN: [id number] Arrival date & time: 02/22/22 9935  Chief Complaint(s) Shortness of Breath  HPI Jorge Adams is a 71 y.o. male with PMH ACS status post previous MI and DES placement, HTN, HLD, squamous cell lung cancer status post radiation and chemotherapy completed 2 weeks ago who presents emergency department for evaluation of shortness of breath and generalized weakness.  Patient seen in the emergency department on 02/12/2022 for a very similar presentation and was found to be profoundly dehydrated but had improvement in his clinical status after appropriate fluid resuscitation.  He had a negative CT PE study at that time and was ultimately discharged home with outpatient oncologic follow-up.  Since discharge, patient has had a steady decline with decreased p.o. intake and worsening generalized weakness.  He states that he was unable to get off the toilet this morning and called EMS.  Patient arrives hypoxic on room air to 89%, tachycardic to 130 and mildly hypotensive at 99/57.  Denies abdominal pain, nausea, vomiting, headache, fever or other systemic symptoms.  Endorses frequent persistent cough.   Past Medical History Past Medical History:  Diagnosis Date   Arthritis    ASHD (arteriosclerotic heart disease)    Atherosclerosis of native arteries of the extremities with intermittent claudication    Cataract    Coronary atherosclerosis of native coronary artery    Diabetes mellitus    NIDDM   Dyslipidemia    ED (erectile dysfunction)    Full dentures    Glaucoma associated with ocular disorder, mild stage    bilateral   Hyperlipidemia    Hypertension    under control, has been on med. > 9 yr.   Impotence of organic origin    Meniscus tear 05/2012   right knee   Myocardial infarction [facility]) 10/2002   Stented coronary artery    Patient Active Problem List   Diagnosis Date Noted   Stage III squamous cell carcinoma  of right lung ([place]) 12/07/2021   Encounter for antineoplastic chemotherapy 12/07/2021   Mass of lower lobe of right lung 11/09/2021   Cough 10/29/2021   Acute bronchitis 10/29/2021   Stenosis of cervical spine with myelopathy ([place]) 05/07/2018   Paresthesia 03/15/2018   Health care maintenance 09/04/2017   Snoring 70/17/7939   Umbilical hernia 03/00/9233   CAD (coronary artery disease) 01/02/2016   Angina pectoris ([place]) 12/18/2015   Routine general medical examination at a health care facility 12/07/2014   Advance care planning 12/07/2014   PAD (peripheral artery disease) ([place]) 12/05/2013   Erectile dysfunction associated with type 2 diabetes mellitus ([place]) 10/31/2013   Type 2 diabetes mellitus with vascular disease ([place]) 10/31/2013   Senile nuclear sclerosis 06/04/2013   Borderline glaucoma with ocular hypertension 06/04/2013   Amblyopia, unspecified 06/04/2013   ASHD (arteriosclerotic heart disease) 01/28/2011   Hypogonadism, male 01/28/2011   Hypertension associated with diabetes ([place]) 01/28/2011   Hyperlipidemia LDL goal <70 01/28/2011   Obesity (BMI 30-39.9) 01/28/2011   Home Medication(s) Prior to Admission medications   Medication Sig Start Date End Date Taking? Authorizing Provider  albuterol (VENTOLIN HFA) 108 (90 Base) MCG/ACT inhaler Inhale 1-2 puffs into the lungs every 6 (six) hours as needed (for cough.  okay to fill with albuterol/proair/ventolin.). 11/06/21   [name], MD  amLODipine-benazepril (LOTREL) 5-20 MG capsule TAKE 1 CAPSULE BY MOUTH EVERY DAY Patient not taking: Reported on 02/12/2022 07/17/21   Adrian Prows, MD  aspirin EC 81 MG tablet  Take 1 tablet (81 mg total) by mouth daily. Okay to restart this medication on 12/05/2021. 12/04/21   Collene Gobble, MD  benzonatate (TESSALON) 200 MG capsule Take 1 capsule (200 mg total) by mouth 3 (three) times daily as needed. Patient taking differently: Take 200 mg by mouth 3 (three) times daily as needed for cough.  01/11/22   [name], MD  blood glucose meter kit and supplies Dispense based on pt and insurance preference. Use up to 3 times daily as directed. (Dx. E11.9). 09/08/20   [name], MD  Blood Pressure Monitoring [facility]) MISC Use daily to check BP.  Dx I10 08/15/20   [name], MD  brimonidine (ALPHAGAN P) 0.1 % SOLN Place 1 drop into both eyes 2 (two) times daily.    [provider]  cilostazol (PLETAL) 100 MG tablet Take 1 tablet (100 mg total) by mouth 2 (two) times daily. Okay to restart this medication on 12/05/2021. 12/04/21   Collene Gobble, MD  Cyanocobalamin (VITAMIN B12 PO) Take 1 tablet by mouth daily.    [provider]  dorzolamide-timolol (COSOPT) 22.3-6.8 MG/ML ophthalmic solution Place 1 drop into both eyes 2 (two) times daily.    [provider]  fenofibrate (TRICOR) 145 MG tablet TAKE 1 TABLET BY MOUTH EVERY DAY IN THE EVENING 01/03/22   Adrian Prows, MD  HYDROcodone bit-homatropine (HYCODAN) 5-1.5 MG/5ML syrup Take 5 mLs by mouth every 8 (eight) hours as needed for cough (sedation caution.). 02/18/22   Curt Bears, MD  metFORMIN (GLUCOPHAGE) 500 MG tablet Take 1 tablet (500 mg total) by mouth 2 (two) times daily with a meal. 12/04/21   Byrum, Rose Fillers, MD  methylPREDNISolone (MEDROL DOSEPAK) 4 MG TBPK tablet Use as instructed 02/13/22   Heilingoetter, [name] L, PA-C  metoprolol tartrate (LOPRESSOR) 25 MG tablet TAKE 1 TABLET BY MOUTH TWICE A DAY 01/03/22   Adrian Prows, MD  Netarsudil-Latanoprost (ROCKLATAN) 0.02-0.005 % SOLN Apply 1 drop to eye at bedtime. In both eyes    [provider]  nitroGLYCERIN (NITROSTAT) 0.4 MG SL tablet Place 1 tablet (0.4 mg total) under the tongue every 5 (five) minutes as needed for chest pain. 11/06/21   [name], MD  omega-3 acid ethyl esters (LOVAZA) 1 g capsule TAKE 1 CAPSULE BY MOUTH TWICE A DAY 01/03/22   Adrian Prows, MD  potassium chloride SA (KLOR-CON M) 20 MEQ tablet Take 1  tablet (20 mEq total) by mouth daily. Patient not taking: Reported on 02/12/2022 02/04/22   Heilingoetter, [name] L, PA-C  prochlorperazine (COMPAZINE) 10 MG tablet Take 1 tablet (10 mg total) by mouth every 6 (six) hours as needed for nausea or vomiting. 02/04/22   Heilingoetter, [name] L, PA-C  rosuvastatin (CRESTOR) 20 MG tablet TAKE 1 TABLET BY MOUTH EVERY DAY 07/30/21   Adrian Prows, MD  sucralfate (CARAFATE) 1 g tablet Take 1 tablet (1 g total) by mouth 4 (four) times daily -  with meals and at bedtime. 5 min before meals for radiation induced esophagitis 01/29/22   Tyler Pita, MD  Past Surgical History Past Surgical History:  Procedure Laterality Date   ANTERIOR CERVICAL DECOMP/DISCECTOMY FUSION N/A 05/07/2018   Procedure: ANTERIOR CERVICAL DECOMPRESSION/DISCECTOMY FUSION CERVICAL FIVE - CERVICAL SIX;  Surgeon: Consuella Lose, MD;  Location: [place];  Service: Neurosurgery;  Laterality: N/A;  ANTERIOR CERVICAL DECOMPRESSION/DISCECTOMY FUSION CERVICAL FIVE - CERVICAL SIX   BRONCHIAL BIOPSY  12/04/2021   Procedure: BRONCHIAL BIOPSIES;  Surgeon: Collene Gobble, MD;  Location: [place];  Service: Cardiopulmonary;;   BRONCHIAL BRUSHINGS  12/04/2021   Procedure: BRONCHIAL BRUSHINGS;  Surgeon: Collene Gobble, MD;  Location: [place];  Service: Cardiopulmonary;;   BRONCHIAL NEEDLE ASPIRATION BIOPSY  12/04/2021   Procedure: BRONCHIAL NEEDLE ASPIRATION BIOPSIES;  Surgeon: Collene Gobble, MD;  Location: [place];  Service: Cardiopulmonary;;   CARDIAC CATHETERIZATION  10/23/2002   had 1 stent placed then-- by Dr. Melvern Banker   CARDIAC CATHETERIZATION N/A 12/21/2015   Procedure: Left Heart Cath and Coronary Angiography;  Surgeon: Adrian Prows, MD;  Location: [place] CV LAB;  Service: Cardiovascular;  Laterality: N/A;   CATARACT EXTRACTION Right 05/2017    CATARACT EXTRACTION Left 07/2017   COLONOSCOPY  10/08/2010   Dr.Patterson   CORONARY ARTERY BYPASS GRAFT N/A 01/02/2016   Procedure: CORONARY ARTERY BYPASS GRAFTING (CABG) TIMES 3 USING LEFT INTERNAL MAMMARY ARTERY AND RIGHT SAPHENOUS LEG VEIN HARVESTED ENDOSCOPICALLY;  Surgeon: Ivin Poot, MD;  Location: [place];  Service: Open Heart Surgery;  Laterality: N/A;   EYE SURGERY     INSERTION OF MESH N/A 05/06/2016   Procedure: INSERTION OF MESH;  Surgeon: Georganna Skeans, MD;  Location: [place];  Service: General;  Laterality: N/A;   KNEE ARTHROSCOPY WITH MEDIAL MENISECTOMY  06/04/2012   Procedure: KNEE ARTHROSCOPY WITH MEDIAL MENISECTOMY;  Surgeon: Ninetta Lights, MD;  Location: [place];  Service: Orthopedics;  Laterality: Right;  RIGHT SCOPE MEDIAL MENISCECTOMY, CHONDROPLASTY, EXCISION LOOSE BODY   LOWER EXTREMITY ANGIOGRAM N/A 11/23/2013   Procedure: LOWER EXTREMITY ANGIOGRAM;  Surgeon: Laverda Page, MD;  Location[facility] CATH LAB;  Service: Cardiovascular;  Laterality: N/A;   NECK SURGERY N/A    02/19/2018   PERIPHERAL VASCULAR CATHETERIZATION N/A 12/21/2015   Procedure: Abdominal Aortogram;  Surgeon: Adrian Prows, MD;  Location: [place] CV LAB;  Service: Cardiovascular;  Laterality: N/A;   PERIPHERAL VASCULAR CATHETERIZATION N/A 12/21/2015   Procedure: Abdominal Aortogram w/Lower Extremity;  Surgeon: Adrian Prows, MD;  Location: [place] CV LAB;  Service: Cardiovascular;  Laterality: N/A;   TEE WITHOUT CARDIOVERSION N/A 01/02/2016   Procedure: TRANSESOPHAGEAL ECHOCARDIOGRAM (TEE);  Surgeon: Ivin Poot, MD;  Location: [place];  Service: Open Heart Surgery;  Laterality: N/A;   UMBILICAL HERNIA REPAIR N/A 05/06/2016   Procedure: HERNIA REPAIR UMBILICAL ADULT;  Surgeon: Georganna Skeans, MD;  Location: [place];  Service: General;  Laterality: N/A;   VIDEO BRONCHOSCOPY N/A 12/04/2021   Procedure: VIDEO BRONCHOSCOPY WITH FLUORO;  Surgeon: Collene Gobble, MD;  Location: [place];  Service: Cardiopulmonary;  Laterality: N/A;   Family History Family History  Problem Relation Age of Onset   Bone cancer Mother    Pneumonia Father    Diabetes Brother    Heart disease Brother        Pacemaker   COPD Brother    Colon cancer Neg Hx    Prostate cancer Neg Hx    Esophageal cancer Neg Hx    Stomach cancer Neg Hx     Social History Social History   Tobacco Use  Smoking status: Former    Packs/day: 2.50    Years: 35.00    Total pack years: 87.50    Types: Cigarettes    Quit date: 12/02/2002    Years since quitting: 19.2   Smokeless tobacco: Never  Vaping Use   Vaping Use: Never used  Substance Use Topics   Alcohol use: Yes    Comment: rare   Drug use: No   Allergies Patient has no known allergies.  Review of Systems Review of Systems  Constitutional:  Positive for fatigue.  Respiratory:  Positive for cough and shortness of breath.     Physical Exam Vital Signs  I have reviewed the triage vital signs BP (!) 99/57 (BP Location: Right Arm)   Pulse (!) 130   Temp 99.3 F (37.4 C) (Oral)   Resp (!) 22   Ht _0  (1.778 m)   Wt 78 kg   SpO2 (!) 89%   BMI 24.68 kg/m   Physical Exam Constitutional:      General: He is not in acute distress.    Appearance: Normal appearance. He is ill-appearing.  HENT:     Head: Normocephalic and atraumatic.     Nose: No congestion or rhinorrhea.  Eyes:     General:        Right eye: No discharge.        Left eye: No discharge.     Extraocular Movements: Extraocular movements intact.     Pupils: Pupils are equal, round, and reactive to light.  Cardiovascular:     Rate and Rhythm: Regular rhythm. Tachycardia present.     Heart sounds: No murmur heard. Pulmonary:     Effort: No respiratory distress.     Breath sounds: Rales present. No wheezing.  Abdominal:     General: There is no distension.     Tenderness: There is no abdominal tenderness.  Musculoskeletal:        General: Normal range  of motion.     Cervical back: Normal range of motion.  Skin:    General: Skin is warm and dry.  Neurological:     General: No focal deficit present.     Mental Status: He is alert.     ED Results and Treatments Labs (all labs ordered are listed, but only abnormal results are displayed) Labs Reviewed  COMPREHENSIVE METABOLIC PANEL - Abnormal; Notable for the following components:      Result Value   Sodium 132 (*)    Glucose, Bld 212 (*)    Calcium 7.7 (*)    Total Protein 5.9 (*)    Albumin 2.0 (*)    All other components within normal limits  CBC WITH DIFFERENTIAL/PLATELET - Abnormal; Notable for the following components:   RBC 2.94 (*)    Hemoglobin 7.8 (*)    HCT 24.0 (*)    RDW 20.4 (*)    Abs Immature Granulocytes 0.08 (*)    All other components within normal limits  CULTURE, BLOOD (ROUTINE X 2)  CULTURE, BLOOD (ROUTINE X 2)  BRAIN NATRIURETIC PEPTIDE  LACTIC ACID, PLASMA  LACTIC ACID, PLASMA  TROPONIN I (HIGH SENSITIVITY)  TROPONIN I (HIGH SENSITIVITY)  Radiology DG Chest Portable 1 View  Result Date: 02/22/2022 CLINICAL DATA:  Shortness of breath for a few days. History of lung cancer. EXAM: PORTABLE CHEST 1 VIEW COMPARISON:  Chest CT 02/12/2022. Radiographs 02/12/2022 and 02/04/2022. FINDINGS: 0732 hours. The heart size and mediastinal contours are stable status post median sternotomy and CABG. Cavitary right lower lobe mass is partially obscured by increasing right basilar airspace disease. There may be a small amount of pleural fluid on the right. No evidence of pneumothorax. The bones appear unchanged. Telemetry leads overlie the chest. IMPRESSION: Increased right basilar airspace disease, partially obscuring the known cavitary mass. Findings may be secondary to radiation pneumonitis or pneumonia. Electronically Signed   By: Richardean Sale M.D.    On: 02/22/2022 08:09    Pertinent labs & imaging results that were available during my care of the patient were reviewed by me and considered in my medical decision making (see MDM for details).  Medications Ordered in ED Medications  lactated ringers bolus 1,000 mL (1,000 mLs Intravenous New Bag/Given 02/22/22 0808)  iohexol (OMNIPAQUE) 300 MG/ML solution 100 mL (100 mLs Intravenous Contrast Given 02/22/22 0922)  sodium chloride (PF) 0.9 % injection (  Given by Other 02/22/22 1012)                                                                                                                                     Procedures .Critical Care  Performed by: Teressa Lower, MD Authorized by: Teressa Lower, MD   Critical care provider statement:    Critical care time (minutes):  30   Critical care was necessary to treat or prevent imminent or life-threatening deterioration of the following conditions:  Circulatory failure, dehydration and respiratory failure   Critical care was time spent personally by me on the following activities:  Development of treatment plan with patient or surrogate, discussions with consultants, evaluation of patient's response to treatment, examination of patient, ordering and review of laboratory studies, ordering and review of radiographic studies, ordering and performing treatments and interventions, pulse oximetry, re-evaluation of patient's condition and review of old charts   (including critical care time)  Medical Decision Making / ED Course   This patient presents to the ED for concern of fatigue, shortness of breath, this involves an extensive number of treatment options, and is a complaint that carries with it a high risk of complications and morbidity.  The differential diagnosis includes pneumonia, radiation pneumonitis, PE, PTX, progression of malignancy  MDM: Patient seen emergency room for evaluation of shortness of breath weakness and dehydration.   Physical exam reveals an ill-appearing patient with irregular tachycardia and rales in the right lower lobe.  Laboratory evaluation with an anemia to 7.8, mild hyponatremia 132, albumin 2.0, BNP and troponin normal.  Lactic acid normal.  I spoke with the patient's primary oncologist Dr. Earlie Server who states that if the patient is in a require a CT  scan he should have it with contrast.  Patient will require CT scan given new oxygen requirement and is saturating well on 2 L.  CT chest abdomen pelvis obtained to evaluate for progressive malignancy that shows a similar-appearing pulmonary cavitation to CT PE but with increasing surrounding groundglass opacities which is either pneumonitis or treatment response.  Patient covered with broad-spectrum antibiotics as he is at high risk for pneumonia in the setting of his recent immunosuppression.  Patient's hypotension and tachycardia improved with 2 L of lactated Ringer's and patient will require hospital admission for management of dehydration, decreased p.o. intake and new oxygen requirement.  Of note, patient states that he is having difficulty swallowing solid foods and feels that it gets stuck in the GE junction.  There may be a concern for radiation esophagitis versus scarring and the patient may benefit from as well study.  Patient then admitted to medicine.   Additional history obtained: -Additional history obtained from wife -External records from outside source obtained and reviewed including: Chart review including previous notes, labs, imaging, consultation notes   Lab Tests: -I ordered, reviewed, and interpreted labs.   The pertinent results include:   Labs Reviewed  COMPREHENSIVE METABOLIC PANEL - Abnormal; Notable for the following components:      Result Value   Sodium 132 (*)    Glucose, Bld 212 (*)    Calcium 7.7 (*)    Total Protein 5.9 (*)    Albumin 2.0 (*)    All other components within normal limits  CBC WITH DIFFERENTIAL/PLATELET -  Abnormal; Notable for the following components:   RBC 2.94 (*)    Hemoglobin 7.8 (*)    HCT 24.0 (*)    RDW 20.4 (*)    Abs Immature Granulocytes 0.08 (*)    All other components within normal limits  CULTURE, BLOOD (ROUTINE X 2)  CULTURE, BLOOD (ROUTINE X 2)  BRAIN NATRIURETIC PEPTIDE  LACTIC ACID, PLASMA  LACTIC ACID, PLASMA  TROPONIN I (HIGH SENSITIVITY)  TROPONIN I (HIGH SENSITIVITY)     Imaging Studies ordered: I ordered imaging studies including chest x-ray, CT chest abdomen pelvis I independently visualized and interpreted imaging. I agree with the radiologist interpretation   Medicines ordered and prescription drug management: Meds ordered this encounter  Medications   lactated ringers bolus 1,000 mL   iohexol (OMNIPAQUE) 300 MG/ML solution 100 mL   sodium chloride (PF) 0.9 % injection    Tamala Julian, Megan L: cabinet override    -I have reviewed the patients home medicines and have made adjustments as needed  Critical interventions Oxygen supplementation, antibiotics, fluid resuscitation  Consultations Obtained: I requested consultation with the patient's primary oncologist Dr. Earlie Server,  and discussed lab and imaging findings as well as pertinent plan - they recommend: CT imaging with contrast   Cardiac Monitoring: The patient was maintained on a cardiac monitor.  I personally viewed and interpreted the cardiac monitored which showed an underlying rhythm of: Sinus tachycardia, NSR  Social Determinants of Health:  Factors impacting patients care include: none   Reevaluation: After the interventions noted above, I reevaluated the patient and found that they have :improved  Co morbidities that complicate the patient evaluation  Past Medical History:  Diagnosis Date   Arthritis    ASHD (arteriosclerotic heart disease)    Atherosclerosis of native arteries of the extremities with intermittent claudication    Cataract    Coronary atherosclerosis of native  coronary artery    Diabetes mellitus  NIDDM   Dyslipidemia    ED (erectile dysfunction)    Full dentures    Glaucoma associated with ocular disorder, mild stage    bilateral   Hyperlipidemia    Hypertension    under control, has been on med. > 9 yr.   Impotence of organic origin    Meniscus tear 05/2012   right knee   Myocardial infarction [facility]) 10/2002   Stented coronary artery       Dispostion: I considered admission for this patient, and given new oxygen requirement, dehydration, patient will require hospital admission     Final Clinical Impression(s) / ED Diagnoses Final diagnoses:  None     _0 @    Teressa Lower, MD 02/22/22 1134

## 2022-02-22 NOTE — Telephone Encounter (Signed)
I returned wife's call.  [facility]  has "pneumonia again and is receiving antibiotics.  She said ED provider talked to [facility].

## 2022-02-22 NOTE — H&P (Signed)
History and Physical    Patient: Jorge Adams UPJ:[id number] DOB: 1950/09/27 DOA: 02/22/2022 DOS: the patient was seen and examined on 02/22/2022 PCP: [name], MD  Patient coming from: Home  Chief Complaint:  Chief Complaint  Patient presents with   Shortness of Breath   HPI: Jorge Adams is a 71 y.o. male with medical history significant of SCC of lung, DM2, HTN, HLD. Presenting with dyspnea. Symptoms started 2 weeks ago. He was seen in the ED at the time and his work up was PNA was negative. He was tachycardic and weak at the time, but seemed to respond well to fluids. The presumption at the time was that he just had radiation and chemo, so he was having some after effects from them. He was discharged to home in an improved status. His wife reports that he has gone down hill since. He had difficulty swallowing and has had poor PO intake during this time. He's been feeling as if food was getting stuck in his throat. Liquids cause a problem as well. They spoke with their oncologist about this and he was given some medicine to try. However it didn't help. The patient has become weaker and his cough has worsened. He hasn't had any fever, N/V/D or sick contacts. Early this morning he woke up with a severe coughing spell. He went to the bathroom, but found he was too weak to move himself out of the bathroom. His family help him to the living room. They checked his sugar and his BP. They found him to be hypotensive, so they called EMS for assistance. They deny any other aggravating or alleviating factors.   Review of Systems: As mentioned in the history of present illness. All other systems reviewed and are negative. Past Medical History:  Diagnosis Date   Arthritis    ASHD (arteriosclerotic heart disease)    Atherosclerosis of native arteries of the extremities with intermittent claudication    Cataract    Coronary atherosclerosis of native coronary artery    Diabetes mellitus     NIDDM   Dyslipidemia    ED (erectile dysfunction)    Full dentures    Glaucoma associated with ocular disorder, mild stage    bilateral   Hyperlipidemia    Hypertension    under control, has been on med. > 9 yr.   Impotence of organic origin    Meniscus tear 05/2012   right knee   Myocardial infarction [facility]) 10/2002   Stented coronary artery    Past Surgical History:  Procedure Laterality Date   ANTERIOR CERVICAL DECOMP/DISCECTOMY FUSION N/A 05/07/2018   Procedure: ANTERIOR CERVICAL DECOMPRESSION/DISCECTOMY FUSION CERVICAL FIVE - CERVICAL SIX;  Surgeon: Consuella Lose, MD;  Location: [place];  Service: Neurosurgery;  Laterality: N/A;  ANTERIOR CERVICAL DECOMPRESSION/DISCECTOMY FUSION CERVICAL FIVE - CERVICAL SIX   BRONCHIAL BIOPSY  12/04/2021   Procedure: BRONCHIAL BIOPSIES;  Surgeon: Collene Gobble, MD;  Location: [place];  Service: Cardiopulmonary;;   BRONCHIAL BRUSHINGS  12/04/2021   Procedure: BRONCHIAL BRUSHINGS;  Surgeon: Collene Gobble, MD;  Location: [place];  Service: Cardiopulmonary;;   BRONCHIAL NEEDLE ASPIRATION BIOPSY  12/04/2021   Procedure: BRONCHIAL NEEDLE ASPIRATION BIOPSIES;  Surgeon: Collene Gobble, MD;  Location: [place];  Service: Cardiopulmonary;;   CARDIAC CATHETERIZATION  10/23/2002   had 1 stent placed then-- by Dr. Melvern Banker   CARDIAC CATHETERIZATION N/A 12/21/2015   Procedure: Left Heart Cath and Coronary Angiography;  Surgeon: Adrian Prows, MD;  Location:  [place] INVASIVE CV LAB;  Service: Cardiovascular;  Laterality: N/A;   CATARACT EXTRACTION Right 05/2017   CATARACT EXTRACTION Left 07/2017   COLONOSCOPY  10/08/2010   Dr.Patterson   CORONARY ARTERY BYPASS GRAFT N/A 01/02/2016   Procedure: CORONARY ARTERY BYPASS GRAFTING (CABG) TIMES 3 USING LEFT INTERNAL MAMMARY ARTERY AND RIGHT SAPHENOUS LEG VEIN HARVESTED ENDOSCOPICALLY;  Surgeon: Ivin Poot, MD;  Location: [place];  Service: Open Heart Surgery;  Laterality: N/A;   EYE SURGERY      INSERTION OF MESH N/A 05/06/2016   Procedure: INSERTION OF MESH;  Surgeon: Georganna Skeans, MD;  Location: [place];  Service: General;  Laterality: N/A;   KNEE ARTHROSCOPY WITH MEDIAL MENISECTOMY  06/04/2012   Procedure: KNEE ARTHROSCOPY WITH MEDIAL MENISECTOMY;  Surgeon: Ninetta Lights, MD;  Location: [place];  Service: Orthopedics;  Laterality: Right;  RIGHT SCOPE MEDIAL MENISCECTOMY, CHONDROPLASTY, EXCISION LOOSE BODY   LOWER EXTREMITY ANGIOGRAM N/A 11/23/2013   Procedure: LOWER EXTREMITY ANGIOGRAM;  Surgeon: Laverda Page, MD;  Location: [facility] CATH LAB;  Service: Cardiovascular;  Laterality: N/A;   NECK SURGERY N/A    02/19/2018   PERIPHERAL VASCULAR CATHETERIZATION N/A 12/21/2015   Procedure: Abdominal Aortogram;  Surgeon: Adrian Prows, MD;  Location: [place] CV LAB;  Service: Cardiovascular;  Laterality: N/A;   PERIPHERAL VASCULAR CATHETERIZATION N/A 12/21/2015   Procedure: Abdominal Aortogram w/Lower Extremity;  Surgeon: Adrian Prows, MD;  Location: [place] CV LAB;  Service: Cardiovascular;  Laterality: N/A;   TEE WITHOUT CARDIOVERSION N/A 01/02/2016   Procedure: TRANSESOPHAGEAL ECHOCARDIOGRAM (TEE);  Surgeon: Ivin Poot, MD;  Location: [place];  Service: Open Heart Surgery;  Laterality: N/A;   UMBILICAL HERNIA REPAIR N/A 05/06/2016   Procedure: HERNIA REPAIR UMBILICAL ADULT;  Surgeon: Georganna Skeans, MD;  Location: [place];  Service: General;  Laterality: N/A;   VIDEO BRONCHOSCOPY N/A 12/04/2021   Procedure: VIDEO BRONCHOSCOPY WITH FLUORO;  Surgeon: Collene Gobble, MD;  Location: [place];  Service: Cardiopulmonary;  Laterality: N/A;   Social History:  reports that he quit smoking about 19 years ago. His smoking use included cigarettes. He has a 87.50 pack-year smoking history. He has never used smokeless tobacco. He reports current alcohol use. He reports that he does not use drugs.  No Known Allergies  Family History  Problem Relation Age of Onset   Bone  cancer Mother    Pneumonia Father    Diabetes Brother    Heart disease Brother        Pacemaker   COPD Brother    Colon cancer Neg Hx    Prostate cancer Neg Hx    Esophageal cancer Neg Hx    Stomach cancer Neg Hx     Prior to Admission medications   Medication Sig Start Date End Date Taking? Authorizing Provider  albuterol (VENTOLIN HFA) 108 (90 Base) MCG/ACT inhaler Inhale 1-2 puffs into the lungs every 6 (six) hours as needed (for cough.  okay to fill with albuterol/proair/ventolin.). 11/06/21   [name], MD  amLODipine-benazepril (LOTREL) 5-20 MG capsule TAKE 1 CAPSULE BY MOUTH EVERY DAY Patient not taking: Reported on 02/12/2022 07/17/21   Adrian Prows, MD  aspirin EC 81 MG tablet Take 1 tablet (81 mg total) by mouth daily. Okay to restart this medication on 12/05/2021. 12/04/21   Collene Gobble, MD  benzonatate (TESSALON) 200 MG capsule Take 1 capsule (200 mg total) by mouth 3 (three) times daily as needed. Patient taking differently: Take 200 mg  by mouth 3 (three) times daily as needed for cough. 01/11/22   [name], MD  blood glucose meter kit and supplies Dispense based on pt and insurance preference. Use up to 3 times daily as directed. (Dx. E11.9). 09/08/20   [name], MD  Blood Pressure Monitoring [facility]) MISC Use daily to check BP.  Dx I10 08/15/20   [name], MD  brimonidine (ALPHAGAN P) 0.1 % SOLN Place 1 drop into both eyes 2 (two) times daily.    [provider]  cilostazol (PLETAL) 100 MG tablet Take 1 tablet (100 mg total) by mouth 2 (two) times daily. Okay to restart this medication on 12/05/2021. 12/04/21   Collene Gobble, MD  Cyanocobalamin (VITAMIN B12 PO) Take 1 tablet by mouth daily.    [provider]  dorzolamide-timolol (COSOPT) 22.3-6.8 MG/ML ophthalmic solution Place 1 drop into both eyes 2 (two) times daily.    [provider]  fenofibrate (TRICOR) 145 MG tablet TAKE 1 TABLET BY MOUTH EVERY DAY IN THE  EVENING 01/03/22   Adrian Prows, MD  HYDROcodone bit-homatropine (HYCODAN) 5-1.5 MG/5ML syrup Take 5 mLs by mouth every 8 (eight) hours as needed for cough (sedation caution.). 02/18/22   Curt Bears, MD  metFORMIN (GLUCOPHAGE) 500 MG tablet Take 1 tablet (500 mg total) by mouth 2 (two) times daily with a meal. 12/04/21   Byrum, Rose Fillers, MD  methylPREDNISolone (MEDROL DOSEPAK) 4 MG TBPK tablet Use as instructed 02/13/22   Heilingoetter, [name] L, PA-C  metoprolol tartrate (LOPRESSOR) 25 MG tablet TAKE 1 TABLET BY MOUTH TWICE A DAY 01/03/22   Adrian Prows, MD  Netarsudil-Latanoprost (ROCKLATAN) 0.02-0.005 % SOLN Apply 1 drop to eye at bedtime. In both eyes    [provider]  nitroGLYCERIN (NITROSTAT) 0.4 MG SL tablet Place 1 tablet (0.4 mg total) under the tongue every 5 (five) minutes as needed for chest pain. 11/06/21   [name], MD  omega-3 acid ethyl esters (LOVAZA) 1 g capsule TAKE 1 CAPSULE BY MOUTH TWICE A DAY 01/03/22   Adrian Prows, MD  potassium chloride SA (KLOR-CON M) 20 MEQ tablet Take 1 tablet (20 mEq total) by mouth daily. Patient not taking: Reported on 02/12/2022 02/04/22   Heilingoetter, [name] L, PA-C  prochlorperazine (COMPAZINE) 10 MG tablet Take 1 tablet (10 mg total) by mouth every 6 (six) hours as needed for nausea or vomiting. 02/04/22   Heilingoetter, [name] L, PA-C  rosuvastatin (CRESTOR) 20 MG tablet TAKE 1 TABLET BY MOUTH EVERY DAY 07/30/21   Adrian Prows, MD  sucralfate (CARAFATE) 1 g tablet Take 1 tablet (1 g total) by mouth 4 (four) times daily -  with meals and at bedtime. 5 min before meals for radiation induced esophagitis 01/29/22   Tyler Pita, MD    Physical Exam: Vitals:   02/22/22 0749 02/22/22 0750 02/22/22 1015  BP: (!) 99/57  (!) 97/57  Pulse: (!) 130  (!) 108  Resp: (!) 22  12  Temp: 99.3 F (37.4 C)    TempSrc: Oral    SpO2: (!) 89%  95%  Weight:  78 kg   Height:  5' 10"  (1.778 m)    General: 71 y.o. male resting in bed in  NAD Eyes: PERRL, normal sclera ENMT: Nares patent w/o discharge, orophaynx clear, dentition normal, ears w/o discharge/lesions/ulcers Neck: Supple, trachea midline Cardiovascular: tachy, +S1, S2, no m/g/r, equal pulses throughout Respiratory: rhonchi right side, increased WOB on 2L [place] GI: BS+, distended but  soft, NT, no masses noted, no organomegaly noted MSK: No e/c/c Neuro: A&O x 3, no focal deficits Psyc: Appropriate interaction and affect, calm/cooperative  Data Reviewed:  Lab Results  Component Value Date   NA 132 (L) 02/22/2022   K 4.0 02/22/2022   CO2 23 02/22/2022   GLUCOSE 212 (H) 02/22/2022   BUN 10 02/22/2022   CREATININE 0.81 02/22/2022   CALCIUM 7.7 (L) 02/22/2022   GFRNONAA >60 02/22/2022   BNP:  65.3 Trp:  12 Lactic acid: 0.8  Lab Results  Component Value Date   WBC 6.2 02/22/2022   HGB 7.8 (L) 02/22/2022   HCT 24.0 (L) 02/22/2022   MCV 81.6 02/22/2022   PLT 164 02/22/2022   CTA Chest PE 1. Cavitation of previously demonstrated hypermetabolic right lower lobe mass, consistent with response to treatment. Appearance is similar to chest CTA of 10 days prior. 2. Increasing surrounding peribronchovascular nodularity and ground-glass opacities, especially in the right middle and lower lobes which may be treatment related or secondary to postobstructive pneumonitis. Attention on follow-up recommended. 3. No evidence of metastatic disease within the abdomen or pelvis. 4. Stable mild splenomegaly and cholelithiasis. 5. Coronary and aortic atherosclerosis (ICD10-I70.0). Emphysema (ICD10-J43.9). High-grade luminal narrowing of the left common iliac artery.  EKG: sinus tach, no st elevations  Assessment and Plan: Sepsis secondary to PNA     - admit to inpt, progressive     - continue HCAP coverage for now     - follow Bld Cx, Ucx     - fluids, nebs     - check urine legionella, strep     - COVID pending     - wean O2 as able; does not use supplemental O2  at home  Dysphagia      - ?secondary to radiation/esophagitis?      - SLP for swallow eval      - NPO  until eval      - protonix  Hx of SCC of right lung     - follows w/ Dr. Julien Nordmann; EDP spoke with Dr. Julien Nordmann who made recommendations for imaging; he is aware of admission  HTN     - hypotensive at presentation, hold home regimen  DM2     - A1c, SSI, glucose checks     - NPO until SLP eval  HLD PAD CAD     - continue home regimen when cleared for PO  Glaucoma     - continue home regimen  Hyponatremia     - mild, getting fluids; follow  Moderate protein-calorie malnutrition     - check prealbumin  Normocytic anemia     - baseline is in the 10 - 12 range and he's been trending down over the last month     - he's started chemo and radiation in that time     - he's had no evidence of frank bleed     - check iron studies; trend H&H for now, transfuse for Hgb < 7  Advance Care Planning:   Code Status: FULL  Consults: Oncology added to service  Family Communication: w/ wife at bedside  Severity of Illness: The appropriate patient status for this patient is INPATIENT. Inpatient status is judged to be reasonable and necessary in order to provide the required intensity of service to ensure the patient's safety. The patient's presenting symptoms, physical exam findings, and initial radiographic and laboratory data in the context of their chronic comorbidities is felt to place them at  high risk for further clinical deterioration. Furthermore, it is not anticipated that the patient will be medically stable for discharge from the hospital within 2 midnights of admission.   * I certify that at the point of admission it is my clinical judgment that the patient will require inpatient hospital care spanning beyond 2 midnights from the point of admission due to high intensity of service, high risk for further deterioration and high frequency of surveillance required.*  Author: Jonnie Finner, DO 02/22/2022 11:27 AM  For on call review [email or web address].

## 2022-02-22 NOTE — Evaluation (Signed)
Clinical/Bedside Swallow Evaluation Patient Details  Name: Jorge Adams MRN: [id number] Date of Birth: 23-Nov-1950  Today's Date: 02/22/2022 Time: SLP Start Time (ACUTE ONLY): 1500 SLP Stop Time (ACUTE ONLY): 1525 SLP Time Calculation (min) (ACUTE ONLY): 25 min  Past Medical History:  Past Medical History:  Diagnosis Date   Arthritis    ASHD (arteriosclerotic heart disease)    Atherosclerosis of native arteries of the extremities with intermittent claudication    Cataract    Coronary atherosclerosis of native coronary artery    Diabetes mellitus    NIDDM   Dyslipidemia    ED (erectile dysfunction)    Full dentures    Glaucoma associated with ocular disorder, mild stage    bilateral   Hyperlipidemia    Hypertension    under control, has been on med. > 9 yr.   Impotence of organic origin    Meniscus tear 05/2012   right knee   Myocardial infarction [facility]) 10/2002   Stented coronary artery    Past Surgical History:  Past Surgical History:  Procedure Laterality Date   ANTERIOR CERVICAL DECOMP/DISCECTOMY FUSION N/A 05/07/2018   Procedure: ANTERIOR CERVICAL DECOMPRESSION/DISCECTOMY FUSION CERVICAL FIVE - CERVICAL SIX;  Surgeon: Consuella Lose, MD;  Location: [place];  Service: Neurosurgery;  Laterality: N/A;  ANTERIOR CERVICAL DECOMPRESSION/DISCECTOMY FUSION CERVICAL FIVE - CERVICAL SIX   BRONCHIAL BIOPSY  12/04/2021   Procedure: BRONCHIAL BIOPSIES;  Surgeon: Collene Gobble, MD;  Location: [place];  Service: Cardiopulmonary;;   BRONCHIAL BRUSHINGS  12/04/2021   Procedure: BRONCHIAL BRUSHINGS;  Surgeon: Collene Gobble, MD;  Location: [place];  Service: Cardiopulmonary;;   BRONCHIAL NEEDLE ASPIRATION BIOPSY  12/04/2021   Procedure: BRONCHIAL NEEDLE ASPIRATION BIOPSIES;  Surgeon: Collene Gobble, MD;  Location: [place];  Service: Cardiopulmonary;;   CARDIAC CATHETERIZATION  10/23/2002   had 1 stent placed then-- by Dr. Melvern Banker   CARDIAC CATHETERIZATION N/A  12/21/2015   Procedure: Left Heart Cath and Coronary Angiography;  Surgeon: Adrian Prows, MD;  Location: [place] CV LAB;  Service: Cardiovascular;  Laterality: N/A;   CATARACT EXTRACTION Right 05/2017   CATARACT EXTRACTION Left 07/2017   COLONOSCOPY  10/08/2010   Dr.Patterson   CORONARY ARTERY BYPASS GRAFT N/A 01/02/2016   Procedure: CORONARY ARTERY BYPASS GRAFTING (CABG) TIMES 3 USING LEFT INTERNAL MAMMARY ARTERY AND RIGHT SAPHENOUS LEG VEIN HARVESTED ENDOSCOPICALLY;  Surgeon: Ivin Poot, MD;  Location: [place];  Service: Open Heart Surgery;  Laterality: N/A;   EYE SURGERY     INSERTION OF MESH N/A 05/06/2016   Procedure: INSERTION OF MESH;  Surgeon: Georganna Skeans, MD;  Location: [place];  Service: General;  Laterality: N/A;   KNEE ARTHROSCOPY WITH MEDIAL MENISECTOMY  06/04/2012   Procedure: KNEE ARTHROSCOPY WITH MEDIAL MENISECTOMY;  Surgeon: Ninetta Lights, MD;  Location: [place];  Service: Orthopedics;  Laterality: Right;  RIGHT SCOPE MEDIAL MENISCECTOMY, CHONDROPLASTY, EXCISION LOOSE BODY   LOWER EXTREMITY ANGIOGRAM N/A 11/23/2013   Procedure: LOWER EXTREMITY ANGIOGRAM;  Surgeon: Laverda Page, MD;  Location: [facility] CATH LAB;  Service: Cardiovascular;  Laterality: N/A;   NECK SURGERY N/A    02/19/2018   PERIPHERAL VASCULAR CATHETERIZATION N/A 12/21/2015   Procedure: Abdominal Aortogram;  Surgeon: Adrian Prows, MD;  Location: [place] CV LAB;  Service: Cardiovascular;  Laterality: N/A;   PERIPHERAL VASCULAR CATHETERIZATION N/A 12/21/2015   Procedure: Abdominal Aortogram w/Lower Extremity;  Surgeon: Adrian Prows, MD;  Location: [place] CV LAB;  Service: Cardiovascular;  Laterality: N/A;  TEE WITHOUT CARDIOVERSION N/A 01/02/2016   Procedure: TRANSESOPHAGEAL ECHOCARDIOGRAM (TEE);  Surgeon: Ivin Poot, MD;  Location: [place];  Service: Open Heart Surgery;  Laterality: N/A;   UMBILICAL HERNIA REPAIR N/A 05/06/2016   Procedure: HERNIA REPAIR UMBILICAL ADULT;  Surgeon:  Georganna Skeans, MD;  Location: [place];  Service: General;  Laterality: N/A;   VIDEO BRONCHOSCOPY N/A 12/04/2021   Procedure: VIDEO BRONCHOSCOPY WITH FLUORO;  Surgeon: Collene Gobble, MD;  Location: [place];  Service: Cardiopulmonary;  Laterality: N/A;   HPI:  71yo male admitted 02/22/22 with SOB x2 weeks. PMH: SCC of lung, DM2, HTN, HLD, arthritis, atherosclerosis, dyslipidemia, full dentures. CXR = Increased right basilar airspace disease, partially obscuring the known cavitary mass. Findings may be secondary to radiation pneumonitis or pneumonia.    Assessment / Plan / Recommendation  Clinical Impression  Pt seen at bedside for assessment of swallow function. CN exam unremarkable. Pt has upper and lower dentures. Pt reports globus sensation, gagging, and regurgitation with solid textures. This started around the time of his chemo/radiation (33 treatments of rad tx per pt). Puree and thin liquids were tolerated without overt s/s aspiration. Solid consistencies not given due to pt's report. Suspect primary esophageal dysphagia based on pt's report and presentation. Recommend full liquid or puree diet with thin liquids. Also recommend consideration of a regular barium swallow (esophagram) to evaluate esophageal motiilty. SLP will follow up for education following esophageal workup. RN and MD informed. SLP Visit Diagnosis: Dysphagia, unspecified (R13.10)    Aspiration Risk  Mild aspiration risk    Diet Recommendation Thin liquid;Dysphagia 1 (Puree) or full liquid  Liquid Administration via: Cup;Straw Medication Administration: Crushed with puree Supervision: Patient able to self feed Compensations: Slow rate;Small sips/bites;Follow solids with liquid Postural Changes: Seated upright at 90 degrees;Remain upright for at least 30 minutes after po intake    Other  Recommendations Recommended Consults: Consider esophageal assessment Oral Care Recommendations: Oral care BID    Recommendations for  follow up therapy are one component of a multi-disciplinary discharge planning process, led by the attending physician.  Recommendations may be updated based on patient status, additional functional criteria and insurance authorization.  Follow up Recommendations Follow physician's recommendations for discharge plan and follow up therapies      Assistance Recommended at Discharge Set up Supervision/Assistance  Functional Status Assessment Patient has had a recent decline in their functional status and demonstrates the ability to make significant improvements in function in a reasonable and predictable amount of time.  Frequency and Duration min 1 x/week  1 week;2 weeks       Prognosis Prognosis for Safe Diet Advancement: Good      Swallow Study   General Date of Onset: 02/22/22 HPI: 71yo male admitted 02/22/22 with SOB x2 weeks. PMH: SCC of lung, DM2, HTN, HLD, arthritis, atherosclerosis, dyslipidemia, full dentures. CXR = Increased right basilar airspace disease, partially obscuring the known cavitary mass. Findings may be secondary to radiation pneumonitis or pneumonia. Type of Study: Bedside Swallow Evaluation Previous Swallow Assessment: none Diet Prior to this Study: NPO Temperature Spikes Noted: No Respiratory Status: Nasal cannula History of Recent Intubation: No Behavior/Cognition: Alert;Cooperative;Pleasant mood Oral Cavity Assessment: Within Functional Limits Oral Care Completed by SLP: No Oral Cavity - Dentition: Dentures, top;Dentures, bottom Vision: Functional for self-feeding Self-Feeding Abilities: Able to feed self Patient Positioning: Upright in bed Baseline Vocal Quality: Normal Volitional Cough: Strong Volitional Swallow: Able to elicit    Oral/Motor/Sensory Function Overall Oral Motor/Sensory Function: Within  functional limits   Ice Chips Ice chips: Not tested   Thin Liquid Thin Liquid: Within functional limits Presentation: Straw;Self Fed    Nectar Thick  Nectar Thick Liquid: Not tested   Honey Thick Honey Thick Liquid: Not tested   Puree Puree: Within functional limits Presentation: Spoon;Self Fed   Solid     Solid: Not tested     Marelly Wehrman B. Quentin Ore, [facility], [place] Speech Language Pathologist Office: [phone]  Shonna Chock 02/22/2022,3:41 PM

## 2022-02-22 NOTE — Progress Notes (Signed)
A consult was received from an ED physician for Vancomycin and Cefepime per pharmacy dosing.  The patient's profile has been reviewed for ht/wt/allergies/indication/available labs.   A one time order has been placed for vancomycin 1500mg  and cefepime 2g .  Further antibiotics/pharmacy consults should be ordered by admitting physician if indicated.                       Thank you,  Gretta Arab PharmD, BCPS Clinical Pharmacist [facility] main pharmacy [phone] 02/22/2022 10:29 AM

## 2022-02-22 NOTE — Progress Notes (Signed)
Pharmacy Antibiotic Note  Jorge Adams is a 71 y.o. male admitted on 02/22/2022 with sepsis, pneumonia.  Pharmacy has been consulted for Vancomycin and Cefepime dosing.  PMH significant for lung cancer and completed a course of Levaquin for pneumonia in June,2023. SCr 0.8, WBC wnl, Afebrile, HR 107, BP 109/70.  Plan: Cefepime 2g IV q8h Vancomycin 1500 mg IV once then 1g IV q12h. (SCr 0.8, est AUC 474) Measure Vanc levels as needed.  Goal AUC = 400 - 550.   Follow up renal function, culture results, and clinical course.   Height: 5\' 10"  (177.8 cm) Weight: 78 kg (172 lb) IBW/kg (Calculated) : 73  Temp (24hrs), Avg:99.1 F (37.3 C), Min:98.9 F (37.2 C), Max:99.3 F (37.4 C)  Recent Labs  Lab 02/22/22 0810 02/22/22 1000 02/22/22 1115  WBC 6.2  --   --   CREATININE 0.81  --   --   LATICACIDVEN  --  0.8 0.8    Estimated Creatinine Clearance: 85.1 mL/min (by C-G formula based on SCr of 0.81 mg/dL).    No Known Allergies  Antimicrobials this admission: 8/4 Cefepime >>  8/4 Vancomycin >>   Microbiology results: 8/4 BCx:  8/4 MRSA PCR:   Thank you for allowing pharmacy to be a part of this patient's care.  Gretta Arab PharmD, BCPS Clinical Pharmacist [facility] main pharmacy [phone] 02/22/2022 12:50 PM

## 2022-02-22 NOTE — ED Triage Notes (Signed)
Pt BIB EMS from home c/o shortness of breath x few days. Pt 89% on room air. Hx lung cancer

## 2022-02-23 ENCOUNTER — Inpatient Hospital Stay (HOSPITAL_COMMUNITY): Payer: Medicare HMO

## 2022-02-23 DIAGNOSIS — C3491 Malignant neoplasm of unspecified part of right bronchus or lung: Secondary | ICD-10-CM

## 2022-02-23 DIAGNOSIS — E1159 Type 2 diabetes mellitus with other circulatory complications: Secondary | ICD-10-CM

## 2022-02-23 DIAGNOSIS — H40059 Ocular hypertension, unspecified eye: Secondary | ICD-10-CM | POA: Diagnosis not present

## 2022-02-23 DIAGNOSIS — L8990 Pressure ulcer of unspecified site, unspecified stage: Secondary | ICD-10-CM | POA: Insufficient documentation

## 2022-02-23 DIAGNOSIS — I152 Hypertension secondary to endocrine disorders: Secondary | ICD-10-CM

## 2022-02-23 DIAGNOSIS — J189 Pneumonia, unspecified organism: Secondary | ICD-10-CM | POA: Diagnosis not present

## 2022-02-23 DIAGNOSIS — D649 Anemia, unspecified: Secondary | ICD-10-CM

## 2022-02-23 LAB — COMPREHENSIVE METABOLIC PANEL
ALT: 17 U/L (ref 0–44)
AST: 19 U/L (ref 15–41)
Albumin: 1.9 g/dL — ABNORMAL LOW (ref 3.5–5.0)
Alkaline Phosphatase: 49 U/L (ref 38–126)
Anion gap: 6 (ref 5–15)
BUN: 7 mg/dL — ABNORMAL LOW (ref 8–23)
CO2: 24 mmol/L (ref 22–32)
Calcium: 7.7 mg/dL — ABNORMAL LOW (ref 8.9–10.3)
Chloride: 104 mmol/L (ref 98–111)
Creatinine, Ser: 0.65 mg/dL (ref 0.61–1.24)
GFR, Estimated: >60 mL/min (ref >60)
Glucose, Bld: 137 mg/dL — ABNORMAL HIGH (ref 70–99)
Potassium: 3.5 mmol/L (ref 3.5–5.1)
Sodium: 134 mmol/L — ABNORMAL LOW (ref 135–145)
Total Bilirubin: 0.8 mg/dL (ref 0.3–1.2)
Total Protein: 5 g/dL — ABNORMAL LOW (ref 6.5–8.1)

## 2022-02-23 LAB — CBC
HCT: 20.4 % — ABNORMAL LOW (ref 39.0–52.0)
Hemoglobin: 6.5 g/dL — CL (ref 13.0–17.0)
MCH: 26.6 pg (ref 26.0–34.0)
MCHC: 31.9 g/dL (ref 30.0–36.0)
MCV: 83.6 fL (ref 80.0–100.0)
Platelets: 133 10*3/uL — ABNORMAL LOW (ref 150–400)
RBC: 2.44 MIL/uL — ABNORMAL LOW (ref 4.22–5.81)
RDW: 20.3 % — ABNORMAL HIGH (ref 11.5–15.5)
WBC: 4 10*3/uL (ref 4.0–10.5)
nRBC: 0 % (ref 0.0–0.2)

## 2022-02-23 LAB — PREPARE RBC (CROSSMATCH)

## 2022-02-23 LAB — GLUCOSE, CAPILLARY
Glucose-Capillary: 98 mg/dL (ref 70–99)
Glucose-Capillary: 116 mg/dL — ABNORMAL HIGH (ref 70–99)
Glucose-Capillary: 122 mg/dL — ABNORMAL HIGH (ref 70–99)
Glucose-Capillary: 118 mg/dL — ABNORMAL HIGH (ref 70–99)

## 2022-02-23 LAB — CORTISOL-AM, BLOOD: Cortisol - AM: 15.2 ug/dL (ref 6.7–22.6)

## 2022-02-23 LAB — HEMOGLOBIN AND HEMATOCRIT, BLOOD
HCT: 22.4 % — ABNORMAL LOW (ref 39.0–52.0)
Hemoglobin: 7.3 g/dL — ABNORMAL LOW (ref 13.0–17.0)

## 2022-02-23 LAB — STREP PNEUMONIAE URINARY ANTIGEN: Strep Pneumo Urinary Antigen: NEGATIVE

## 2022-02-23 LAB — PROCALCITONIN: Procalcitonin: 0.56 ng/mL

## 2022-02-23 LAB — PROTIME-INR
INR: 1.7 — ABNORMAL HIGH (ref 0.8–1.2)
Prothrombin Time: 19.8 seconds — ABNORMAL HIGH (ref 11.4–15.2)

## 2022-02-23 LAB — MRSA NEXT GEN BY PCR, NASAL: MRSA by PCR Next Gen: NOT DETECTED

## 2022-02-23 MED ORDER — METOPROLOL TARTRATE 25 MG PO TABS
25.0000 mg | ORAL_TABLET | Freq: Two times a day (BID) | ORAL | Status: DC
  Filled 2022-02-23: qty 1

## 2022-02-23 MED ORDER — SODIUM CHLORIDE 0.9% IV SOLUTION: Freq: Once | INTRAVENOUS | Status: AC

## 2022-02-23 MED ORDER — IPRATROPIUM-ALBUTEROL 0.5-2.5 (3) MG/3ML IN SOLN
3.0000 mL | Freq: Three times a day (TID) | RESPIRATORY_TRACT | Status: DC
  Administered 2022-02-23 – 2022-02-24 (×2): 3 mL via RESPIRATORY_TRACT
  Filled 2022-02-23 (×2): qty 3

## 2022-02-23 MED ORDER — METOPROLOL TARTRATE 25 MG PO TABS
12.5000 mg | ORAL_TABLET | Freq: Two times a day (BID) | ORAL | Status: DC
  Administered 2022-02-23 – 2022-02-24 (×2): 12.5 mg via ORAL
  Filled 2022-02-23 (×2): qty 1

## 2022-02-23 MED ORDER — MELATONIN 3 MG PO TABS
3.0000 mg | ORAL_TABLET | Freq: Every evening | ORAL | Status: DC | PRN
  Administered 2022-02-23: 3 mg via ORAL
  Filled 2022-02-23: qty 1

## 2022-02-23 NOTE — Progress Notes (Signed)
   02/22/22 2202  Assess: MEWS Score  Temp 100.1 F (37.8 C)  BP 139/68  MAP (mmHg) 89  Pulse Rate (!) 127  ECG Heart Rate (!) 128  Resp 20  Level of Consciousness Alert  SpO2 93 %  O2 Device Room Air  Patient Activity (if Appropriate) In bed  Assess: MEWS Score  MEWS Temp 0  MEWS Systolic 0  MEWS Pulse 2  MEWS RR 0  MEWS LOC 0  MEWS Score 2  MEWS Score Color Yellow  Assess: if the MEWS score is Yellow or Red  Were vital signs taken at a resting state? Yes  Focused Assessment No change from prior assessment  Does the patient meet 2 or more of the SIRS criteria? Yes  Does the patient have a confirmed or suspected source of infection? Yes  Provider and Rapid Response Notified? Yes  MEWS guidelines implemented *See Row Information* Yes  Treat  MEWS Interventions Administered prn meds/treatments (PRN Tylenol after on call put in order)  Pain Scale 0-10  Pain Score 0  Complains of Fever  Interventions Medication (see MAR)  Patients response to intervention Effective  Take Vital Signs  Increase Vital Sign Frequency  Yellow: Q 2hr X 2 then Q 4hr X 2, if remains yellow, continue Q 4hrs  Escalate  MEWS: Escalate Yellow: discuss with charge nurse/RN and consider discussing with provider and RRT  Notify: Charge Nurse/RN  Name of Charge Nurse/RN Notified Lorri Frederick., RN  Date Charge Nurse/RN Notified 02/22/22  Time Charge Nurse/RN Notified 2206  Notify: Provider  Provider Name/Title Sharion Settler, NP  Date Provider Notified 02/22/22  Time Provider Notified 2205  Method of Notification Page  Notification Reason Other (Comment) (Patient's elevated heart rate and low grade fever)  Provider response See new orders  Date of Provider Response 02/22/22  Time of Provider Response 2214  Notify: Rapid Response  Name of Rapid Response RN Notified Barnet Glasgow, RN (Rapid Response RN)  Date Rapid Response Notified 02/22/22  Time Rapid Response Notified 2208  Document  Patient Outcome  Other (Comment) (HR still elevated but fever came down; continued to monitor and notified on call provider)  Progress note created (see row info) Yes  Assess: SIRS CRITERIA  SIRS Temperature  0  SIRS Pulse 1  SIRS Respirations  0  SIRS WBC 1  SIRS Score Sum  2

## 2022-02-23 NOTE — Progress Notes (Signed)
[employer] - patient has had low grade fever and tachycardia as well as soft pressure. HGB came resulted 6.5, down from 7.,   Action - One unit PRBC to be transfused. Risks/benefits and need explained to patient and wife at bedside. Patient denies signs of active bleeding.  Educated on signs of active bleeding  Response - to be asseseed

## 2022-02-23 NOTE — Progress Notes (Signed)
[facility]  PROGRESS NOTE  Jorge Adams PPI:[id number] DOB: [birth year] DOA: 02/22/2022 PCP: [name], MD   Brief HPI:   71 year old male with medical history of small cell lung cancer, s/p chemo and radiation therapy, diabetes mellitus type 2, hypertension, hyperlipidemia presented with shortness of breath.  Patient stated symptoms started 2 weeks ago at that time he was seen in the ED, work-up for pneumonia was negative, he was tachycardic and weak at that time.  Responded to fluids and was discharged home. Patient came back to hospital as he developed generalized weakness. In the ED CTA chest showed cavitation of previously demonstrated hypermetabolic right lower lobe mass consistent with response to treatment.  Also showed increasing surrounding peribronchovascular nodularity and groundglass opacities especially in the right middle and lower lobe which may be treatment related or secondary to postobstructive pneumonitis. Patient started on empiric antibiotics for pneumonia.    Subjective   Patient seen and examined, plan for barium swallow this morning for evaluation of esophageal motility.Marland Kitchen  He was seen by speech therapy yesterday and was started on dysphagia 1 diet.   Assessment/Plan:    Postobstructive pneumonia -Seen on CTA chest -Continue vancomycin and cefepime -Procalcitonin 0.56 -Follow blood culture results.  Dysphagia -?  Radiation esophagitis -Speech therapy obtain for swallow evaluation, recommend barium swallow to evaluate esophageal motility -Esophagram pending for today  History of small cell lung cancer -Follows oncology as outpatient -Completed radiation and chemotherapy 2 weeks ago  Hypertension -Patient was hypotensive on presentation -Blood pressure is stable at this time -We will restart metoprolol 25 mg p.o. twice daily  Diabetes mellitus type 2 -Start sliding scale insulin with NovoLog 7 units -CBG well  controlled  Glaucoma -Continue home regimen  Normocytic anemia -Baseline hemoglobin has been 10-12 -Hemoglobin 6.5 last night; s/p 1 unit PRBC -Likely from aggressive IV fluid hydration -This morning hemoglobin up to 7.3 -Check stool for occult blood -Transfuse for hemoglobin less than 7      Medications     aspirin EC  81 mg Oral Daily   brimonidine  1 drop Both Eyes BID   cilostazol  100 mg Oral BID   dorzolamide-timolol  1 drop Both Eyes BID   enoxaparin (LOVENOX) injection  40 mg Subcutaneous Q24H   fenofibrate  160 mg Oral QHS   insulin aspart  0-9 Units Subcutaneous TID WC   ipratropium-albuterol  3 mL Nebulization Q6H   metoprolol tartrate  2.5 mg Intravenous Once   Netarsudil-Latanoprost  1 drop Ophthalmic QHS   pantoprazole (PROTONIX) IV  40 mg Intravenous Q24H   rosuvastatin  20 mg Oral Daily     Data Reviewed:   CBG:  Recent Labs  Lab 02/22/22 1650 02/22/22 2103 02/23/22 0752 02/23/22 1243  GLUCAP 137* 151* 98 116*    SpO2: 92 % O2 Flow Rate (L/min): 2 L/min    Vitals:   02/23/22 0442 02/23/22 0631 02/23/22 0728 02/23/22 0751  BP: 106/62  118/70   Pulse: (!) 106 (!) 108 (!) 110   Resp: 20 19 20    Temp: 98.6 F (37 C) 98.6 F (37 C) 98.8 F (37.1 C)   TempSrc: Oral Oral Oral   SpO2: 96% 97% 94% 92%  Weight:      Height:          Data Reviewed:  Basic Metabolic Panel: Recent Labs  Lab 02/22/22 0810 02/23/22 0033  NA 132* 134*  K 4.0 3.5  CL 101 104  CO2 23  24  GLUCOSE 212* 137*  BUN 10 7*  CREATININE 0.81 0.65  CALCIUM 7.7* 7.7*    CBC: Recent Labs  Lab 02/22/22 0810 02/22/22 1900 02/23/22 0033 02/23/22 0933  WBC 6.2  --  4.0  --   NEUTROABS 5.0  --   --   --   HGB 7.8* 7.1* 6.5* 7.3*  HCT 24.0* 22.0* 20.4* 22.4*  MCV 81.6  --  83.6  --   PLT 164  --  133*  --     LFT Recent Labs  Lab 02/22/22 0810 02/23/22 0033  AST 27 19  ALT 22 17  ALKPHOS 65 49  BILITOT 0.9 0.8  PROT 5.9* 5.0*  ALBUMIN 2.0* 1.9*      Antibiotics: Anti-infectives (From admission, onward)    Start     Dose/Rate Route Frequency Ordered Stop   02/23/22 0000  vancomycin (VANCOCIN) IVPB 1000 mg/200 mL premix        1,000 mg 200 mL/hr over 60 Minutes Intravenous Every 12 hours 02/22/22 1302     02/22/22 2000  ceFEPIme (MAXIPIME) 2 g in sodium chloride 0.9 % 100 mL IVPB        2 g 200 mL/hr over 30 Minutes Intravenous Every 8 hours 02/22/22 1302     02/22/22 1715  ceFEPIme (MAXIPIME) 2 g in sodium chloride 0.9 % 100 mL IVPB  Status:  Discontinued        2 g 200 mL/hr over 30 Minutes Intravenous  Once 02/22/22 1625 02/22/22 1627   02/22/22 1100  vancomycin (VANCOREADY) IVPB 1500 mg/300 mL        1,500 mg 150 mL/hr over 120 Minutes Intravenous  Once 02/22/22 1034 02/22/22 1414   02/22/22 1045  ceFEPIme (MAXIPIME) 2 g in sodium chloride 0.9 % 100 mL IVPB        2 g 200 mL/hr over 30 Minutes Intravenous  Once 02/22/22 1034 02/22/22 2036        DVT prophylaxis: Lovenox  Code Status: Full code  Family Communication: Discussed with wife at bedside   CONSULTS    Objective    Physical Examination:   General-appears in no acute distress Heart-S1-S2, regular, no murmur auscultated Lungs-clear to auscultation bilaterally, no wheezing or crackles auscultated Abdomen-soft, nontender, no organomegaly Extremities-no edema in the lower extremities Neuro-alert, oriented x3, no focal deficit noted   Status is: Inpatient: Postobstructive pneumonia    Pressure Injury 02/22/22 Sacrum Upper Stage 2 -  Partial thickness loss of dermis presenting as a shallow open injury with a red, pink wound bed without slough. Pink wound bed (Active)  02/22/22 1630  Location: Sacrum  Location Orientation: Upper  Staging: Stage 2 -  Partial thickness loss of dermis presenting as a shallow open injury with a red, pink wound bed without slough.  Wound Description (Comments): Pink wound bed  Present on Admission: Yes         [place]   [facility] If 7PM-7AM, please contact night-coverage at [email or web address], Office  [phone]   02/23/2022, 1:01 PM  LOS: 1 day

## 2022-02-23 NOTE — Plan of Care (Signed)

## 2022-02-24 ENCOUNTER — Inpatient Hospital Stay (HOSPITAL_COMMUNITY): Payer: Medicare HMO

## 2022-02-24 DIAGNOSIS — I428 Other cardiomyopathies: Secondary | ICD-10-CM

## 2022-02-24 DIAGNOSIS — J189 Pneumonia, unspecified organism: Secondary | ICD-10-CM | POA: Diagnosis not present

## 2022-02-24 DIAGNOSIS — D649 Anemia, unspecified: Secondary | ICD-10-CM | POA: Diagnosis not present

## 2022-02-24 DIAGNOSIS — E1159 Type 2 diabetes mellitus with other circulatory complications: Secondary | ICD-10-CM | POA: Diagnosis not present

## 2022-02-24 DIAGNOSIS — H40059 Ocular hypertension, unspecified eye: Secondary | ICD-10-CM | POA: Diagnosis not present

## 2022-02-24 LAB — COMPREHENSIVE METABOLIC PANEL
ALT: 18 U/L (ref 0–44)
AST: 22 U/L (ref 15–41)
Albumin: 1.7 g/dL — ABNORMAL LOW (ref 3.5–5.0)
Alkaline Phosphatase: 51 U/L (ref 38–126)
Anion gap: 7 (ref 5–15)
BUN: 5 mg/dL — ABNORMAL LOW (ref 8–23)
CO2: 23 mmol/L (ref 22–32)
Calcium: 7.5 mg/dL — ABNORMAL LOW (ref 8.9–10.3)
Chloride: 103 mmol/L (ref 98–111)
Creatinine, Ser: 0.57 mg/dL — ABNORMAL LOW (ref 0.61–1.24)
GFR, Estimated: >60 mL/min (ref >60)
Glucose, Bld: 115 mg/dL — ABNORMAL HIGH (ref 70–99)
Potassium: 3.4 mmol/L — ABNORMAL LOW (ref 3.5–5.1)
Sodium: 133 mmol/L — ABNORMAL LOW (ref 135–145)
Total Bilirubin: 0.8 mg/dL (ref 0.3–1.2)
Total Protein: 5 g/dL — ABNORMAL LOW (ref 6.5–8.1)

## 2022-02-24 LAB — ECHOCARDIOGRAM COMPLETE
AV Peak grad: 6.1 mmHg
Ao pk vel: 1.24 m/s
Area-P 1/2: 6.9 cm2
Height: 70 in
S' Lateral: 2.6 cm
Weight: 2752 oz

## 2022-02-24 LAB — CBC
HCT: 25 % — ABNORMAL LOW (ref 39.0–52.0)
Hemoglobin: 7.4 g/dL — ABNORMAL LOW (ref 13.0–17.0)
MCH: 26.3 pg (ref 26.0–34.0)
MCHC: 29.6 g/dL — ABNORMAL LOW (ref 30.0–36.0)
MCV: 89 fL (ref 80.0–100.0)
Platelets: 118 10*3/uL — ABNORMAL LOW (ref 150–400)
RBC: 2.81 MIL/uL — ABNORMAL LOW (ref 4.22–5.81)
RDW: 19.8 % — ABNORMAL HIGH (ref 11.5–15.5)
WBC: 4.1 10*3/uL (ref 4.0–10.5)
nRBC: 0 % (ref 0.0–0.2)

## 2022-02-24 LAB — TYPE AND SCREEN
ABO/RH(D): O POS
Antibody Screen: NEGATIVE
Unit division: 0

## 2022-02-24 LAB — BPAM RBC
Blood Product Expiration Date: 202309042359
ISSUE DATE / TIME: 202308050419
Unit Type and Rh: 5100

## 2022-02-24 LAB — GLUCOSE, CAPILLARY
Glucose-Capillary: 127 mg/dL — ABNORMAL HIGH (ref 70–99)
Glucose-Capillary: 153 mg/dL — ABNORMAL HIGH (ref 70–99)
Glucose-Capillary: 132 mg/dL — ABNORMAL HIGH (ref 70–99)
Glucose-Capillary: 190 mg/dL — ABNORMAL HIGH (ref 70–99)

## 2022-02-24 MED ORDER — POTASSIUM CHLORIDE CRYS ER 20 MEQ PO TBCR
40.0000 meq | EXTENDED_RELEASE_TABLET | Freq: Once | ORAL | Status: AC
  Administered 2022-02-24: 40 meq via ORAL
  Filled 2022-02-24: qty 2

## 2022-02-24 MED ORDER — ENSURE ENLIVE PO LIQD
237.0000 mL | Freq: Two times a day (BID) | ORAL | Status: DC
  Administered 2022-02-24 – 2022-02-25 (×2): 237 mL via ORAL

## 2022-02-24 MED ORDER — IPRATROPIUM-ALBUTEROL 0.5-2.5 (3) MG/3ML IN SOLN
3.0000 mL | Freq: Two times a day (BID) | RESPIRATORY_TRACT | Status: DC
  Administered 2022-02-24 – 2022-02-25 (×2): 3 mL via RESPIRATORY_TRACT
  Filled 2022-02-24 (×2): qty 3

## 2022-02-24 MED ORDER — ADULT MULTIVITAMIN W/MINERALS CH
1.0000 | ORAL_TABLET | Freq: Every day | ORAL | Status: DC
  Administered 2022-02-24 – 2022-02-25 (×2): 1 via ORAL
  Filled 2022-02-24 (×2): qty 1

## 2022-02-24 NOTE — Progress Notes (Signed)
[facility]  PROGRESS NOTE  Jorge Adams WSF:[id number] DOB: 1950-12-10 DOA: 02/22/2022 PCP: [name], MD   Brief HPI:   71 year old male with medical history of small cell lung cancer, s/p chemo and radiation therapy, diabetes mellitus type 2, hypertension, hyperlipidemia presented with shortness of breath.  Patient stated symptoms started 2 weeks ago at that time he was seen in the ED, work-up for pneumonia was negative, he was tachycardic and weak at that time.  Responded to fluids and was discharged home. Patient came back to hospital as he developed generalized weakness. In the ED CTA chest showed cavitation of previously demonstrated hypermetabolic right lower lobe mass consistent with response to treatment.  Also showed increasing surrounding peribronchovascular nodularity and groundglass opacities especially in the right middle and lower lobe which may be treatment related or secondary to postobstructive pneumonitis. Patient started on empiric antibiotics for pneumonia.    Subjective   Patient seen and examined, complains of coughing up clear phlegm.  Blood pressure has been soft.   Assessment/Plan:    Postobstructive pneumonia -Seen on CTA chest -Continue vancomycin and cefepime -Procalcitonin 0.56 -Follow blood culture results. -Start flutter valve, incentive spirometry  Dysphagia -?  Radiation esophagitis -Speech therapy obtain for swallow evaluation, recommend barium swallow to evaluate esophageal motility -Esophagram showed no esophageal motility issue, no mass -Continue dysphagia 1 diet  History of small cell lung cancer -Follows oncology as outpatient -Completed radiation and chemotherapy 2 weeks ago  Hypertension -Patient was hypotensive on presentation -Blood pressure is stable at this time -We will hold metoprolol -Check serum cortisol level in a.m. -Patient echocardiogram in 2017, will obtain repeat echogram to check for any  structural abnormality underlying cardiomyopathy  Diabetes mellitus type 2 -Start sliding scale insulin with NovoLog 7 units -CBG well controlled  Glaucoma -Continue home regimen  Normocytic anemia -Baseline hemoglobin has been 10-12 -Hemoglobin 6.5 last night; s/p 1 unit PRBC -Likely from aggressive IV fluid hydration -This morning hemoglobin up to 7.3 -Check stool for occult blood -Transfuse for hemoglobin less than 7  Hypokalemia -Replace potassium and follow BMP in am    Medications     aspirin EC  81 mg Oral Daily   brimonidine  1 drop Both Eyes BID   cilostazol  100 mg Oral BID   dorzolamide-timolol  1 drop Both Eyes BID   enoxaparin (LOVENOX) injection  40 mg Subcutaneous Q24H   feeding supplement  237 mL Oral BID BM   fenofibrate  160 mg Oral QHS   insulin aspart  0-9 Units Subcutaneous TID WC   ipratropium-albuterol  3 mL Nebulization BID   multivitamin with minerals  1 tablet Oral Daily   Netarsudil-Latanoprost  1 drop Ophthalmic QHS   pantoprazole (PROTONIX) IV  40 mg Intravenous Q24H   rosuvastatin  20 mg Oral Daily     Data Reviewed:   CBG:  Recent Labs  Lab 02/23/22 1243 02/23/22 1723 02/23/22 2143 02/24/22 0737 02/24/22 1159  GLUCAP 116* 122* 118* 127* 153*    SpO2: 92 % O2 Flow Rate (L/min): 2 L/min    Vitals:   02/23/22 2019 02/24/22 0440 02/24/22 0808 02/24/22 0815  BP:  107/64 108/63   Pulse:  (!) 106 (!) 107   Resp:  19    Temp:  98.4 F (36.9 C)    TempSrc:  Oral    SpO2: 97% 94%  92%  Weight:      Height:  Data Reviewed:  Basic Metabolic Panel: Recent Labs  Lab 02/22/22 0810 02/23/22 0033 02/24/22 0414  NA 132* 134* 133*  K 4.0 3.5 3.4*  CL 101 104 103  CO2 23 24 23   GLUCOSE 212* 137* 115*  BUN 10 7* 5*  CREATININE 0.81 0.65 0.57*  CALCIUM 7.7* 7.7* 7.5*    CBC: Recent Labs  Lab 02/22/22 0810 02/22/22 1900 02/23/22 0033 02/23/22 0933 02/24/22 0414  WBC 6.2  --  4.0  --  4.1  NEUTROABS 5.0   --   --   --   --   HGB 7.8* 7.1* 6.5* 7.3* 7.4*  HCT 24.0* 22.0* 20.4* 22.4* 25.0*  MCV 81.6  --  83.6  --  89.0  PLT 164  --  133*  --  118*    LFT Recent Labs  Lab 02/22/22 0810 02/23/22 0033 02/24/22 0414  AST 27 19 22   ALT 22 17 18   ALKPHOS 65 49 51  BILITOT 0.9 0.8 0.8  PROT 5.9* 5.0* 5.0*  ALBUMIN 2.0* 1.9* 1.7*     Antibiotics: Anti-infectives (From admission, onward)    Start     Dose/Rate Route Frequency Ordered Stop   02/23/22 0000  vancomycin (VANCOCIN) IVPB 1000 mg/200 mL premix  Status:  Discontinued        1,000 mg 200 mL/hr over 60 Minutes Intravenous Every 12 hours 02/22/22 1302 02/24/22 1058   02/22/22 2000  ceFEPIme (MAXIPIME) 2 g in sodium chloride 0.9 % 100 mL IVPB        2 g 200 mL/hr over 30 Minutes Intravenous Every 8 hours 02/22/22 1302 03/01/22 2159   02/22/22 1715  ceFEPIme (MAXIPIME) 2 g in sodium chloride 0.9 % 100 mL IVPB  Status:  Discontinued        2 g 200 mL/hr over 30 Minutes Intravenous  Once 02/22/22 1625 02/22/22 1627   02/22/22 1100  vancomycin (VANCOREADY) IVPB 1500 mg/300 mL        1,500 mg 150 mL/hr over 120 Minutes Intravenous  Once 02/22/22 1034 02/22/22 1414   02/22/22 1045  ceFEPIme (MAXIPIME) 2 g in sodium chloride 0.9 % 100 mL IVPB        2 g 200 mL/hr over 30 Minutes Intravenous  Once 02/22/22 1034 02/22/22 2036        DVT prophylaxis: Lovenox  Code Status: Full code  Family Communication: Discussed with wife at bedside   CONSULTS    Objective    Physical Examination:   General-appears in no acute distress Heart-S1-S2, regular, no murmur auscultated Lungs-clear to auscultation bilaterally, no wheezing or crackles auscultated Abdomen-soft, nontender, no organomegaly Extremities-no edema in the lower extremities Neuro-alert, oriented x3, no focal deficit noted  Status is: Inpatient: Postobstructive pneumonia    Pressure Injury 02/22/22 Sacrum Upper Stage 2 -  Partial thickness loss of dermis  presenting as a shallow open injury with a red, pink wound bed without slough. Pink wound bed (Active)  02/22/22 1630  Location: Sacrum  Location Orientation: Upper  Staging: Stage 2 -  Partial thickness loss of dermis presenting as a shallow open injury with a red, pink wound bed without slough.  Wound Description (Comments): Pink wound bed  Present on Admission: Yes        [place]   [facility] If 7PM-7AM, please contact night-coverage at [email or web address], Office  [phone]   02/24/2022, 1:24 PM  LOS: 2 days

## 2022-02-24 NOTE — Progress Notes (Signed)
Initial Nutrition Assessment  INTERVENTION:   -Ensure Plus High Protein po BID, each supplement provides 350 kcal and 20 grams of protein.   -Multivitamin with minerals daily  NUTRITION DIAGNOSIS:   Increased nutrient needs related to cancer and cancer related treatments as evidenced by estimated needs.   GOAL:   Patient will meet greater than or equal to 90% of their needs  MONITOR:   PO intake, Supplement acceptance, Weight trends, Labs, I & O's, Skin  REASON FOR ASSESSMENT:   Malnutrition Screening Tool    ASSESSMENT:   71 year old male with medical history of small cell lung cancer, s/p chemo and radiation therapy, diabetes mellitus type 2, hypertension, hyperlipidemia presented with shortness of breath.  Per chart review, pt with increased SOB over the past 2 weeks. Has had difficulty swallowing, SLP evaluated and recommended dysphagia 1 diet.   Will order Ensure supplements for additional kcals and protein. Pt undergoing chemo for lung cancer, last chemo 7/17. Pt currently consuming 50% of meals.  Per weight records, pt has lost 30 lbs since 12/07/21 (14% wt loss x 2.5 months, significant for time frame).  Medications reviewed.  Labs reviewed: CBGs: 98-116 Low Na  Diet Order:   Diet Order             DIET - DYS 1 Room service appropriate? Yes; Fluid consistency: Thin  Diet effective now                   EDUCATION NEEDS:   No education needs have been identified at this time  Skin:  Skin Assessment: Skin Integrity Issues: Skin Integrity Issues:: Stage II Stage II: sacrum  Last BM:  8/4  Height:   Ht Readings from Last 1 Encounters:  02/22/22 5\' 10"  (1.778 m)    Weight:   Wt Readings from Last 1 Encounters:  02/22/22 78 kg    BMI:  Body mass index is 24.68 kg/m.  Estimated Nutritional Needs:   Kcal:  7322-0254  Protein:  110-125g  Fluid:  2.3L/day  Clayton Bibles, MS, RD, LDN Inpatient Clinical Dietitian Contact information  available via Amion

## 2022-02-24 NOTE — Plan of Care (Signed)

## 2022-02-25 DIAGNOSIS — I10 Essential (primary) hypertension: Secondary | ICD-10-CM

## 2022-02-25 DIAGNOSIS — R1310 Dysphagia, unspecified: Secondary | ICD-10-CM

## 2022-02-25 DIAGNOSIS — H40059 Ocular hypertension, unspecified eye: Secondary | ICD-10-CM | POA: Diagnosis not present

## 2022-02-25 DIAGNOSIS — J189 Pneumonia, unspecified organism: Secondary | ICD-10-CM | POA: Diagnosis not present

## 2022-02-25 LAB — GLUCOSE, CAPILLARY: Glucose-Capillary: 133 mg/dL — ABNORMAL HIGH (ref 70–99)

## 2022-02-25 LAB — BASIC METABOLIC PANEL
Anion gap: 5 (ref 5–15)
BUN: 9 mg/dL (ref 8–23)
CO2: 24 mmol/L (ref 22–32)
Calcium: 7.6 mg/dL — ABNORMAL LOW (ref 8.9–10.3)
Chloride: 104 mmol/L (ref 98–111)
Creatinine, Ser: 0.72 mg/dL (ref 0.61–1.24)
GFR, Estimated: >60 mL/min (ref >60)
Glucose, Bld: 127 mg/dL — ABNORMAL HIGH (ref 70–99)
Potassium: 3.5 mmol/L (ref 3.5–5.1)
Sodium: 133 mmol/L — ABNORMAL LOW (ref 135–145)

## 2022-02-25 LAB — LEGIONELLA PNEUMOPHILA SEROGP 1 UR AG

## 2022-02-25 LAB — CORTISOL: Cortisol, Plasma: 15.4 ug/dL

## 2022-02-25 MED ORDER — GUAIFENESIN ER 600 MG PO TB12: 600.0000 mg | ORAL_TABLET | Freq: Two times a day (BID) | ORAL | 0 refills | Status: DC

## 2022-02-25 MED ORDER — IPRATROPIUM-ALBUTEROL 0.5-2.5 (3) MG/3ML IN SOLN: 3.0000 mL | Freq: Four times a day (QID) | RESPIRATORY_TRACT | 1 refills | Status: AC | PRN

## 2022-02-25 MED ORDER — AMOXICILLIN-POT CLAVULANATE 875-125 MG PO TABS: 1.0000 | ORAL_TABLET | Freq: Two times a day (BID) | ORAL | 0 refills | Status: DC

## 2022-02-25 MED ORDER — METOPROLOL TARTRATE 25 MG PO TABS: 12.5000 mg | ORAL_TABLET | Freq: Two times a day (BID) | ORAL | 4 refills | Status: DC

## 2022-02-25 NOTE — Progress Notes (Signed)
SATURATION QUALIFICATIONS: (This note is used to comply with regulatory documentation for home oxygen)  Patient Saturations on Room Air at Rest = 95-97%  Patient Saturations on Room Air while Ambulating = 91-94%   Please briefly explain why patient needs home oxygen:  No need for at home oxygen.  Patient's O2 saturations never dropped below 88%. Patient ambulated about 120 ft, denied SOB and had a heart rate between 110-125 bpm. O2 sats fluctuated between 91-94% while ambulating.

## 2022-02-25 NOTE — Plan of Care (Signed)

## 2022-02-25 NOTE — TOC Progression Note (Signed)
Transition of Care [facility]) - Progression Note    Patient Details  Name: Jorge Adams MRN: [id number] Date of Birth: 1951-03-29  [facility] CM/SW Contact  Leeroy Cha, RN Phone Number: 02/25/2022, 10:22 AM  Clinical Narrative:    [phone] at 1022/nebulizer ordered for pt througha [facility] for home use.        Expected Discharge Plan and Services     Discharge Planning Services: CM Consult     Expected Discharge Date: 02/25/22               DME Arranged: Nebulizer machine DME Agency: [employer] Date DME Agency Contacted: 02/25/22 Time DME Agency Contacted: [id number] Representative spoke with at DME Agency: [email or web address]             Social Determinants of Health ([place]) Interventions    Readmission Risk Interventions     No data to display

## 2022-02-25 NOTE — Progress Notes (Signed)
PHARMACY NOTE -  Cefepime  Pharmacy has been assisting with dosing of cefepime for PNA. Dosage remains stable at 2g IV q8 hr and further renal adjustments per institutional Pharmacy antibiotic protocol  Pharmacy will sign off, following peripherally for culture results or dose adjustments. Please reconsult if a change in clinical status warrants re-evaluation of dosage.  Reuel Boom, PharmD, BCPS [phone] 02/25/2022, 8:36 AM

## 2022-02-25 NOTE — Discharge Summary (Signed)
Physician Discharge Summary   Patient: Jorge Adams MRN: [id number] DOB: 03-Jun-1951  Admit date:     02/22/2022  Discharge date: 02/25/22  Discharge Physician: Oswald Hillock   PCP: [name], MD   Recommendations at discharge:   Follow-up Dr. Julien Nordmann as outpatient Continue Augmentin 1 tablet p.o. twice daily for 3 more days  Discharge Diagnoses: Principal Problem:   PNA (pneumonia) Active Problems:   Hypertension associated with diabetes ([place])   Hyperlipidemia LDL goal <70   Borderline glaucoma with ocular hypertension   Type 2 diabetes mellitus with vascular disease (HCC)   PAD (peripheral artery disease) (HCC)   CAD (coronary artery disease)   Stage III squamous cell carcinoma of right lung (HCC)   Sepsis (HCC)   Dysphagia   Hyponatremia   Normocytic anemia   Pressure injury of skin  Resolved Problems:   * No resolved hospital problems. *  Hospital Course: 71 year old male with medical history of small cell lung cancer, s/p chemo and radiation therapy, diabetes mellitus type 2, hypertension, hyperlipidemia presented with shortness of breath.  Patient stated symptoms started 2 weeks ago at that time he was seen in the ED, work-up for pneumonia was negative, he was tachycardic and weak at that time.  Responded to fluids and was discharged home. Patient came back to hospital as he developed generalized weakness. In the ED CTA chest showed cavitation of previously demonstrated hypermetabolic right lower lobe mass consistent with response to treatment.  Also showed increasing surrounding peribronchovascular nodularity and groundglass opacities especially in the right middle and lower lobe which may be treatment related or secondary to postobstructive pneumonitis. Patient started on empiric antibiotics for pneumonia.  Assessment and Plan:  Postobstructive pneumonia -Seen on CTA chest -Patient was started on vancomycin and cefepime -Procalcitonin 0.56 -blood  culture showed no growth  -Patient has significantly improved, no longer requiring oxygen -On ambulation, oxygen saturation did not drop less than 91% -Will discharge home on Augmentin 1 tablet p.o. twice daily for 3 more days to complete 7 days of treatment -Will also get Mucinex 1 tablet p.o. twice daily for 7 days -DuoNeb nebulizers every 6 hours as needed    Dysphagia -?  Radiation esophagitis -Speech therapy obtain for swallow evaluation, recommend barium swallow to evaluate esophageal motility -Esophagram showed no esophageal motility issue, no mass -Continue dysphagia 1 diet   History of small cell lung cancer -Follows oncology as outpatient -Completed radiation and chemotherapy 2 weeks ago -Follow-up oncology as outpatient   Hypertension -Patient was hypotensive on presentation -Blood pressure is stable at this time -We will change metoprolol to 12.5 mg p.o. twice daily, hold for SBP less than 110 -Check serum cortisol level in a.m. -Patient echocardiogram in 2017,  -Repeat echocardiogram yesterday showed grade 1 diastolic dysfunction with EF of 55 to 60%   Diabetes mellitus type 2 -Continue home regimen   Glaucoma -Continue home regimen   Normocytic anemia -Baseline hemoglobin has been 10-12 -Hemoglobin 6.5 last night; s/p 1 unit PRBC -Likely from aggressive IV fluid hydration -This morning hemoglobin stable at 7.6 -Follow-up oncology as outpatient         Consultants:  Procedures performed: Echocardiogram Disposition: Home Diet recommendation:  Discharge Diet Orders (From admission, onward)     Start     Ordered   02/25/22 0000  Diet - low sodium heart healthy        02/25/22 1017           Carb modified diet  DISCHARGE MEDICATION: Allergies as of 02/25/2022   No Known Allergies      Medication List     STOP taking these medications    amLODipine-benazepril 5-20 MG capsule Commonly known as: LOTREL   methylPREDNISolone 4 MG Tbpk  tablet Commonly known as: MEDROL DOSEPAK   potassium chloride SA 20 MEQ tablet Commonly known as: KLOR-CON M       TAKE these medications    albuterol 108 (90 Base) MCG/ACT inhaler Commonly known as: VENTOLIN HFA Inhale 1-2 puffs into the lungs every 6 (six) hours as needed (for cough.  okay to fill with albuterol/proair/ventolin.). What changed:  how much to take when to take this reasons to take this   amoxicillin-clavulanate 875-125 MG tablet Commonly known as: AUGMENTIN Take 1 tablet by mouth 2 (two) times daily.   aspirin EC 81 MG tablet Take 1 tablet (81 mg total) by mouth daily. Okay to restart this medication on 12/05/2021.   benzonatate 200 MG capsule Commonly known as: TESSALON Take 1 capsule (200 mg total) by mouth 3 (three) times daily as needed.   blood glucose meter kit and supplies Dispense based on pt and insurance preference. Use up to 3 times daily as directed. (Dx. E11.9).   brimonidine 0.1 % Soln Commonly known as: ALPHAGAN P Place 2 drops into both eyes 2 (two) times daily.   cilostazol 100 MG tablet Commonly known as: PLETAL Take 1 tablet (100 mg total) by mouth 2 (two) times daily. Okay to restart this medication on 12/05/2021.   dorzolamide-timolol 22.3-6.8 MG/ML ophthalmic solution Commonly known as: COSOPT Place 1 drop into both eyes 2 (two) times daily.   fenofibrate 145 MG tablet Commonly known as: TRICOR TAKE 1 TABLET BY MOUTH EVERY DAY IN THE EVENING What changed:  how much to take how to take this   guaiFENesin 600 MG 12 hr tablet Commonly known as: Mucinex Take 1 tablet (600 mg total) by mouth 2 (two) times daily.   HYDROcodone bit-homatropine 5-1.5 MG/5ML syrup Commonly known as: HYCODAN Take 5 mLs by mouth every 8 (eight) hours as needed for cough (sedation caution.). What changed: when to take this   ipratropium-albuterol 0.5-2.5 (3) MG/3ML Soln Commonly known as: DUONEB Take 3 mLs by nebulization every 6 (six) hours as  needed.   metFORMIN 500 MG tablet Commonly known as: GLUCOPHAGE Take 1 tablet (500 mg total) by mouth 2 (two) times daily with a meal.   metoprolol tartrate 25 MG tablet Commonly known as: LOPRESSOR Take 0.5 tablets (12.5 mg total) by mouth 2 (two) times daily. Hold for SBP less than 110 What changed:  how much to take additional instructions   nitroGLYCERIN 0.4 MG SL tablet Commonly known as: NITROSTAT Place 1 tablet (0.4 mg total) under the tongue every 5 (five) minutes as needed for chest pain.   omega-3 acid ethyl esters 1 g capsule Commonly known as: LOVAZA TAKE 1 CAPSULE BY MOUTH TWICE A DAY   prochlorperazine 10 MG tablet Commonly known as: COMPAZINE Take 1 tablet (10 mg total) by mouth every 6 (six) hours as needed for nausea or vomiting. What changed: when to take this   [place] 0.02-0.005 % Soln Generic drug: Netarsudil-Latanoprost Apply 1 drop to eye at bedtime. In both eyes   rosuvastatin 20 MG tablet Commonly known as: CRESTOR TAKE 1 TABLET BY MOUTH EVERY DAY   Sphygmomanometer Misc Use daily to check BP.  Dx I10   sucralfate 1 g tablet Commonly known as: Carafate Take 1  tablet (1 g total) by mouth 4 (four) times daily -  with meals and at bedtime. 5 min before meals for radiation induced esophagitis What changed:  when to take this additional instructions   VITAMIN B12 PO Take 1 tablet by mouth in the morning and at bedtime.               Durable Medical Equipment  (From admission, onward)           Start     Ordered   02/25/22 1019  For home use only DME Nebulizer machine  Once       Question Answer Comment  Patient needs a nebulizer to treat with the following condition CAP (community acquired pneumonia)   Length of Need Lifetime      02/25/22 1018            Discharge Exam: Filed Weights   02/22/22 0750  Weight: 78 kg   General-appears in no acute distress Heart-S1-S2, regular, no murmur auscultated Lungs-clear to  auscultation bilaterally, no wheezing or crackles auscultated Abdomen-soft, nontender, no organomegaly Extremities-no edema in the lower extremities Neuro-alert, oriented x3, no focal deficit noted  Condition at discharge: good  The results of significant diagnostics from this hospitalization (including imaging, microbiology, ancillary and laboratory) are listed below for reference.   Imaging Studies: ECHOCARDIOGRAM COMPLETE  Result Date: 02/24/2022    ECHOCARDIOGRAM REPORT   Patient Name:   SAMSON RALPH Date of Exam: 02/24/2022 Medical Rec #:  [id number]          Height:       70.0 in Accession #:    [id number]         Weight:       172.0 lb Date of Birth:  [birth year]         BSA:          1.958 m Patient Age:    23 years           BP:           00/00 mmHg Patient Gender: M                  HR:           95 bpm. Exam Location:  Inpatient Procedure: 2D Echo, Cardiac Doppler and Color Doppler Indications:    nonischemic cardiomyopathy  History:        Patient has prior history of Echocardiogram examinations, most                 recent 01/02/2016. CAD; Risk Factors:Diabetes and Hypertension.  Sonographer:    Jefferey Pica Referring Phys: [place]  1. Left ventricular ejection fraction, by estimation, is 55 to 60%. The left ventricle has normal function. The left ventricle has no regional wall motion abnormalities. There is mild concentric left ventricular hypertrophy. Left ventricular diastolic parameters are consistent with Grade I diastolic dysfunction (impaired relaxation).  2. Right ventricular systolic function is normal. The right ventricular size is normal. There is normal pulmonary artery systolic pressure.  3. The mitral valve is normal in structure. No evidence of mitral valve regurgitation. No evidence of mitral stenosis.  4. The aortic valve is tricuspid. Aortic valve regurgitation is not visualized. Aortic valve sclerosis is present, with no evidence of aortic valve  stenosis.  5. The inferior vena cava is normal in size with greater than 50% respiratory variability, suggesting right atrial pressure of 3 mmHg. Comparison(s): No prior Echocardiogram.  FINDINGS  Left Ventricle: Left ventricular ejection fraction, by estimation, is 55 to 60%. The left ventricle has normal function. The left ventricle has no regional wall motion abnormalities. The left ventricular internal cavity size was normal in size. There is  mild concentric left ventricular hypertrophy. Left ventricular diastolic parameters are consistent with Grade I diastolic dysfunction (impaired relaxation). Right Ventricle: The right ventricular size is normal. Right ventricular systolic function is normal. There is normal pulmonary artery systolic pressure. The tricuspid regurgitant velocity is 2.50 m/s, and with an assumed right atrial pressure of 10 mmHg, the estimated right ventricular systolic pressure is 65.5 mmHg. Left Atrium: Left atrial size was normal in size. Right Atrium: Right atrial size was normal in size. Pericardium: Trivial pericardial effusion is present. Mitral Valve: The mitral valve is normal in structure. No evidence of mitral valve regurgitation. No evidence of mitral valve stenosis. Tricuspid Valve: The tricuspid valve is normal in structure. Tricuspid valve regurgitation is mild . No evidence of tricuspid stenosis. Aortic Valve: The aortic valve is tricuspid. Aortic valve regurgitation is not visualized. Aortic valve sclerosis is present, with no evidence of aortic valve stenosis. Aortic valve peak gradient measures 6.1 mmHg. Pulmonic Valve: The pulmonic valve was normal in structure. Pulmonic valve regurgitation is trivial. No evidence of pulmonic stenosis. Aorta: The aortic root is normal in size and structure. Venous: The inferior vena cava is normal in size with greater than 50% respiratory variability, suggesting right atrial pressure of 3 mmHg. IAS/Shunts: No atrial level shunt detected by  color flow Doppler.  LEFT VENTRICLE PLAX 2D LVIDd:         4.00 cm Diastology LVIDs:         2.60 cm LV e' medial:    13.70 cm/s LV PW:         1.30 cm LV E/e' medial:  4.2 LV IVS:        1.30 cm LV e' lateral:   10.28 cm/s                        LV E/e' lateral: 5.5  RIGHT VENTRICLE            IVC RV Basal diam:  3.70 cm    IVC diam: 1.40 cm RV Mid diam:    3.40 cm RV S prime:     8.18 cm/s TAPSE (M-mode): 1.3 cm LEFT ATRIUM             Index        RIGHT ATRIUM           Index LA diam:        3.60 cm 1.84 cm/m   RA Area:     18.80 cm LA Vol (A2C):   53.6 ml 27.38 ml/m  RA Volume:   56.30 ml  28.76 ml/m LA Vol (A4C):   45.2 ml 23.09 ml/m LA Biplane Vol: 50.6 ml 25.85 ml/m  AORTIC VALVE AV Vmax:      123.50 cm/s AV Peak Grad: 6.1 mmHg LVOT Vmax:    109.00 cm/s LVOT Vmean:   56.300 cm/s LVOT VTI:     0.158 m  AORTA Ao Root diam: 3.70 cm Ao Asc diam:  3.50 cm MITRAL VALVE                TRICUSPID VALVE MV Area (PHT): 6.90 cm     TR Peak grad:   25.0 mmHg MV Decel Time: 110 msec  TR Vmax:        250.00 cm/s MV E velocity: 57.00 cm/s MV A velocity: 107.00 cm/s  SHUNTS MV E/A ratio:  0.53         Systemic VTI: 0.16 m Kirk Ruths MD Electronically signed by Kirk Ruths MD Signature Date/Time: 02/24/2022/10:55:52 AM    Final    DG ESOPHAGUS W SINGLE CM (SOL OR THIN BA)  Result Date: 02/23/2022 CLINICAL DATA:  Patient with history of lung cancer with prior chemoradiation; now with solid dysphagia/globus sensation mid chest region EXAM: ESOPHAGUS/BARIUM SWALLOW TECHNIQUE: Single contrast examination was performed using thin liquid barium. This exam was performed by Nash Mantis- and was supervised and interpreted by Dr. Misty Stanley FLUOROSCOPY: Radiation Exposure Index (as provided by the fluoroscopic device): 54.20 mGy Kerma COMPARISON:  None Available. FINDINGS: AP, lateral, and oblique imaging of the esophagus was obtained while the patient was swallowing barium contrast material. Study is limited by  delayed oral transit and piecemeal swallowing, but there is no evidence for obstructing esophageal lesion. No gross mucosal irregularity. No evidence for diverticulum or gross mucosal ulcer. No substantial hiatal hernia. Although no esophageal stricture is discernible, piecemeal swallowing and patient reluctance to swallow a 13 mm barium tablet limit assessment. Ingested 58m barium tablet: Not given- patient declined Other: None. IMPRESSION: Limited study without gross esophageal abnormality. No evidence for obstructing mass lesion. While no stricture is evident, sensitivity is markedly decreased by piecemeal swallowing and patient declining a 13 mm barium tablet. Speech pathology consultation may prove helpful. Electronically Signed   By: EMisty StanleyM.D.   On: 02/23/2022 12:07   CT CHEST ABDOMEN PELVIS W CONTRAST  Result Date: 02/22/2022 CLINICAL DATA:  Metastatic lung cancer. Restaging. * Tracking Code: BO * EXAM: CT CHEST, ABDOMEN, AND PELVIS WITH CONTRAST TECHNIQUE: Multidetector CT imaging of the chest, abdomen and pelvis was performed following the standard protocol during bolus administration of intravenous contrast. RADIATION DOSE REDUCTION: This exam was performed according to the departmental dose-optimization program which includes automated exposure control, adjustment of the mA and/or kV according to patient size and/or use of iterative reconstruction technique. CONTRAST:  1068mOMNIPAQUE IOHEXOL 300 MG/ML  SOLN COMPARISON:  Chest CTA 02/12/2022.  PET-CT 12/13/2021. FINDINGS: CT CHEST FINDINGS Cardiovascular: No acute vascular findings are evident. Diffuse atherosclerosis of the aorta, great vessels and coronary arteries status post median sternotomy and CABG. The heart size is normal. There is no pericardial effusion. Mediastinum/Nodes: Stable 8 mm right paratracheal node on image 21/2 which was not significantly hypermetabolic on PET-CT. Additional small mediastinal lymph nodes are stable. No  axillary adenopathy. The thyroid gland, trachea and esophagus demonstrate no significant findings. Lungs/Pleura: Cavitation of the right lower lobe mass is again noted with a central air-fluid level. This appears similar to the recent chest CTA, measuring approximately 6.3 x 4.2 cm on image 36/2. The margins of this mass are partly obscured by surrounding right lower lobe airspace disease. Surrounding areas of nodular peribronchovascular thickening and ground-glass opacity have increased compared with the recent CT, especially in the right middle lobe. Scattered ill-defined nodular densities in the left lung are similar. Underlying mild centrilobular and paraseptal emphysema. Musculoskeletal/Chest wall: No chest wall mass or suspicious osseous findings. Healed median sternotomy. CT ABDOMEN AND PELVIS FINDINGS Hepatobiliary: The liver is normal in density without suspicious focal abnormality. Small gallstones are noted. No evidence of gallbladder wall thickening or biliary dilatation. Pancreas: Unremarkable. No pancreatic ductal dilatation or surrounding inflammatory changes. Spleen: The spleen measures 14.7  x 7.4 x 12.6 cm (volume = 720 cm^3), consistent with mild splenomegaly, unchanged. No focal abnormality identified. Adrenals/Urinary Tract: Both adrenal glands appear normal. The kidneys appear normal without evidence of urinary tract calculus, suspicious lesion or hydronephrosis. The bladder appears normal for its degree of distention. Stomach/Bowel: No enteric contrast administered. The stomach appears unremarkable for its degree of distension. No evidence of bowel wall thickening, distention or surrounding inflammatory change. The appendix appears normal. Vascular/Lymphatic: There are no enlarged abdominal or pelvic lymph nodes. Diffuse aortic and branch vessel atherosclerosis without evidence of aneurysm or large vessel occlusion. There is high-grade luminal narrowing of the left common iliac artery.  Reproductive: Stable central calcifications within the prostate gland. Other: Prominent fat in both inguinal canals. No ascites or peritoneal nodularity. Possible postsurgical changes at the umbilicus without recurrent hernia. Musculoskeletal: No acute or significant osseous findings. Mild spondylosis, greatest at L4-5 where there is a grade 1 degenerative anterolisthesis. IMPRESSION: 1. Cavitation of previously demonstrated hypermetabolic right lower lobe mass, consistent with response to treatment. Appearance is similar to chest CTA of 10 days prior. 2. Increasing surrounding peribronchovascular nodularity and ground-glass opacities, especially in the right middle and lower lobes which may be treatment related or secondary to postobstructive pneumonitis. Attention on follow-up recommended. 3. No evidence of metastatic disease within the abdomen or pelvis. 4. Stable mild splenomegaly and cholelithiasis. 5. Coronary and aortic atherosclerosis (ICD10-I70.0). Emphysema (ICD10-J43.9). High-grade luminal narrowing of the left common iliac artery. Electronically Signed   By: Richardean Sale M.D.   On: 02/22/2022 10:23   DG Chest Portable 1 View  Result Date: 02/22/2022 CLINICAL DATA:  Shortness of breath for a few days. History of lung cancer. EXAM: PORTABLE CHEST 1 VIEW COMPARISON:  Chest CT 02/12/2022. Radiographs 02/12/2022 and 02/04/2022. FINDINGS: 0732 hours. The heart size and mediastinal contours are stable status post median sternotomy and CABG. Cavitary right lower lobe mass is partially obscured by increasing right basilar airspace disease. There may be a small amount of pleural fluid on the right. No evidence of pneumothorax. The bones appear unchanged. Telemetry leads overlie the chest. IMPRESSION: Increased right basilar airspace disease, partially obscuring the known cavitary mass. Findings may be secondary to radiation pneumonitis or pneumonia. Electronically Signed   By: Richardean Sale M.D.   On:  02/22/2022 08:09   CT Angio Chest PE W and/or Wo Contrast  Result Date: 02/12/2022 CLINICAL DATA:  71 year old male history of shortness of breath and cough for 2 weeks. Suspected pulmonary embolism. History of lung cancer status post chemotherapy and radiation therapy (radiation therapy completed 1 week ago). * Tracking Code: BO * EXAM: CT ANGIOGRAPHY CHEST WITH CONTRAST TECHNIQUE: Multidetector CT imaging of the chest was performed using the standard protocol during bolus administration of intravenous contrast. Multiplanar CT image reconstructions and MIPs were obtained to evaluate the vascular anatomy. RADIATION DOSE REDUCTION: This exam was performed according to the departmental dose-optimization program which includes automated exposure control, adjustment of the mA and/or kV according to patient size and/or use of iterative reconstruction technique. CONTRAST:  166m OMNIPAQUE IOHEXOL 350 MG/ML SOLN COMPARISON:  Chest CT 11/09/2021. FINDINGS: Cardiovascular: There are no filling defects within the pulmonary arterial tree to suggest pulmonary embolism. Heart size is normal. There is no significant pericardial fluid, thickening or pericardial calcification. There is aortic atherosclerosis, as well as atherosclerosis of the great vessels of the mediastinum and the coronary arteries, including calcified atherosclerotic plaque in the left main, left anterior descending, left circumflex and right coronary  arteries. Status post median sternotomy for CABG including LIMA to the LAD. Mediastinum/Nodes: No pathologically enlarged mediastinal or hilar lymph nodes. Esophagus is unremarkable in appearance. No axillary lymphadenopathy. Lungs/Pleura: Previously noted right lower lobe mass is now centrally cavitary with thickened irregular walls estimated to measure approximately 7.3 x 6.1 x 6.5 cm on today's examination (axial image 79 of series 4 and coronal image 100 of series 7). Surrounding areas of ground-glass  attenuation, septal thickening, thickening of the peribronchovascular interstitium and regional architectural distortion are noted on today's study, likely to reflect a combination of chronic post obstructive changes and evolving postradiation changes. No pleural effusions. Upper Abdomen: Aortic atherosclerosis. Musculoskeletal: Median sternotomy wires. There are no aggressive appearing lytic or blastic lesions noted in the visualized portions of the skeleton. Review of the MIP images confirms the above findings. IMPRESSION: 1. No evidence of pulmonary embolism. 2. Interval cavitation of previously noted large right lower lobe mass with what appears to be evolving postobstructive and postradiation changes in the surrounding lung parenchyma, as above. 3. Aortic atherosclerosis, in addition to left main and three-vessel coronary artery disease. Status post median sternotomy for CABG including LIMA to the LAD. Aortic Atherosclerosis (ICD10-I70.0). Electronically Signed   By: Vinnie Langton M.D.   On: 02/12/2022 05:46   DG Chest Port 1 View  Result Date: 02/12/2022 CLINICAL DATA:  Shortness of breath, cough EXAM: PORTABLE CHEST 1 VIEW COMPARISON:  02/04/2022 FINDINGS: Improving cavitary lesion in the central right lower lobe, corresponding to the patient's known primary bronchogenic neoplasm. Left lung is clear.  No pleural effusion or pneumothorax. The heart is normal in size. Postsurgical changes related to prior CABG. Median sternotomy. IMPRESSION: Improving cavitary lesion in the central right lower lobe, corresponding to the patient's known primary bronchogenic neoplasm. No evidence of acute cardiopulmonary disease. Electronically Signed   By: Julian Hy M.D.   On: 02/12/2022 03:11   DG Chest 2 View  Result Date: 02/05/2022 CLINICAL DATA:  History of lung cancer.  Cough. EXAM: CHEST - 2 VIEW COMPARISON:  Chest x-ray 01/14/2022. FINDINGS: Mediastinum and hilar structures normal. Prior CABG. Heart  size stable. Large cavitary lesion in the mid posterior right lung is again noted. This lesion has decreased in size from a maximum diameter of 11 cm to a maximum diameter of 7.5 cm. Again this could be a neoplastic or infectious process. No new focal abnormalities identified. No pleural effusion or pneumothorax. Prior cervical spine fusion. Degenerative change lumbar spine. IMPRESSION: Large cavitary lesion in the mid posterior right lung is again noted. This lesion has decreased in size from a maximum diameter of 11 cm on 01/14/2022 to maximum diameter of 7.5 cm on today's exam. No new focal abnormalities identified. Electronically Signed   By: Marcello Moores  Register M.D.   On: 02/05/2022 09:31    Microbiology: Results for orders placed or performed during the hospital encounter of 02/22/22  Blood culture (routine x 2)     Status: None (Preliminary result)   Collection Time: 02/22/22  9:08 AM   Specimen: BLOOD  Result Value Ref Range Status   Specimen Description   Final    BLOOD SITE NOT SPECIFIED Performed at [facility] [address] [place], [place] [address]    Special Requests   Final    BOTTLES DRAWN AEROBIC AND ANAEROBIC Blood Culture adequate volume Performed at [facility] [address] [place], [place] [address]    Culture   Final    NO  GROWTH 3 DAYS Performed at [facility], [place] [address], [place], [place] [address]    Report Status PENDING  Incomplete  Blood culture (routine x 2)     Status: None (Preliminary result)   Collection Time: 02/22/22  9:50 AM   Specimen: BLOOD  Result Value Ref Range Status   Specimen Description   Final    BLOOD SITE NOT SPECIFIED Performed at [facility] [address] [place], [place] [address]    Special Requests   Final    BOTTLES DRAWN AEROBIC AND ANAEROBIC Blood Culture adequate volume Performed at [facility] [address] [place], [place]  [address]    Culture   Final    NO GROWTH 3 DAYS Performed at [facility], [place] [address], [place], [place] [address]    Report Status PENDING  Incomplete  Resp Panel by RT-PCR (Flu A&B, Covid) Anterior Nasal Swab     Status: None   Collection Time: 02/22/22 11:10 AM   Specimen: Anterior Nasal Swab  Result Value Ref Range Status   SARS Coronavirus 2 by RT PCR NEGATIVE NEGATIVE Final    Comment: (NOTE) SARS-CoV-2 target nucleic acids are NOT DETECTED.  The SARS-CoV-2 RNA is generally detectable in upper respiratory specimens during the acute phase of infection. The lowest concentration of SARS-CoV-2 viral copies this assay can detect is 138 copies/mL. A negative result does not preclude SARS-Cov-2 infection and should not be used as the sole basis for treatment or other patient management decisions. A negative result may occur with  improper specimen collection/handling, submission of specimen other than nasopharyngeal swab, presence of viral mutation(s) within the areas targeted by this assay, and inadequate number of viral copies(<138 copies/mL). A negative result must be combined with clinical observations, patient history, and epidemiological information. The expected result is Negative.  Fact Sheet for Patients:  EntrepreneurPulse.com.au  Fact Sheet for Healthcare Providers:  IncredibleEmployment.be  This test is no t yet approved or cleared by the [place] FDA and  has been authorized for detection and/or diagnosis of SARS-CoV-2 by FDA under an Emergency Use Authorization (EUA). This EUA will remain  in effect (meaning this test can be used) for the duration of the COVID-19 declaration under Section 564(b)(1) of the Act, 21 U.S.C.section 360bbb-3(b)(1), unless the authorization is terminated  or revoked sooner.       Influenza A by PCR NEGATIVE NEGATIVE Final   Influenza B by PCR NEGATIVE NEGATIVE Final    Comment:  (NOTE) The Xpert Xpress SARS-CoV-2/FLU/RSV plus assay is intended as an aid in the diagnosis of influenza from Nasopharyngeal swab specimens and should not be used as a sole basis for treatment. Nasal washings and aspirates are unacceptable for Xpert Xpress SARS-CoV-2/FLU/RSV testing.  Fact Sheet for Patients: EntrepreneurPulse.com.au  Fact Sheet for Healthcare Providers: IncredibleEmployment.be  This test is not yet approved or cleared by the [place] FDA and has been authorized for detection and/or diagnosis of SARS-CoV-2 by FDA under an Emergency Use Authorization (EUA). This EUA will remain in effect (meaning this test can be used) for the duration of the COVID-19 declaration under Section 564(b)(1) of the Act, 21 U.S.C. section 360bbb-3(b)(1), unless the authorization is terminated or revoked.  Performed at [facility], [place] [address], [place], [place] [address]   MRSA Next Gen by PCR, Nasal     Status: None   Collection Time: 02/22/22  2:11 PM   Specimen: Nasal Mucosa; Nasal  Swab  Result Value Ref Range Status   MRSA by PCR Next Gen NOT DETECTED NOT DETECTED Final    Comment: (NOTE) The GeneXpert MRSA Assay (FDA approved for NASAL specimens only), is one component of a comprehensive MRSA colonization surveillance program. It is not intended to diagnose MRSA infection nor to guide or monitor treatment for MRSA infections. Test performance is not FDA approved in patients less than 35 years old. Performed at [facility], [place] [address], [place], [place] [address]     Labs: CBC: Recent Labs  Lab 02/22/22 [phone] 02/22/22 1900 02/23/22 0033 02/23/22 0933 02/24/22 0414  WBC 6.2  --  4.0  --  4.1  NEUTROABS 5.0  --   --   --   --   HGB 7.8* 7.1* 6.5* 7.3* 7.4*  HCT 24.0* 22.0* 20.4* 22.4* 25.0*  MCV 81.6  --  83.6  --  89.0  PLT 164  --  133*  --  672*   Basic Metabolic Panel: Recent  Labs  Lab 02/22/22 0810 02/23/22 0033 02/24/22 0414 02/25/22 0314  NA 132* 134* 133* 133*  K 4.0 3.5 3.4* 3.5  CL 101 104 103 104  CO2 _0 GLUCOSE 212* 137* 115* 127*  BUN 10 7* 5* 9  CREATININE 0.81 0.65 0.57* 0.72  CALCIUM 7.7* 7.7* 7.5* 7.6*   Liver Function Tests: Recent Labs  Lab 02/22/22 0810 02/23/22 0033 02/24/22 0414  AST _1 ALT _2 ALKPHOS 65 49 51  BILITOT 0.9 0.8 0.8  PROT 5.9* 5.0* 5.0*  ALBUMIN 2.0* 1.9* 1.7*   CBG: Recent Labs  Lab 02/24/22 0737 02/24/22 1159 02/24/22 1633 02/24/22 2109 02/25/22 0744  GLUCAP 127* 153* 132* 190* 133*    Discharge time spent: greater than 30 minutes.  Signed: Oswald Hillock, MD [facility] 02/25/2022

## 2022-02-25 NOTE — Progress Notes (Signed)
AVS and discharge instructions reviewed w/ patient and wife at the bedside. Both verbalized understanding and had no further questions.

## 2022-02-26 ENCOUNTER — Other Ambulatory Visit: Payer: Self-pay | Admitting: *Deleted

## 2022-02-26 ENCOUNTER — Ambulatory Visit (INDEPENDENT_AMBULATORY_CARE_PROVIDER_SITE_OTHER): Payer: Medicare HMO | Admitting: Internal Medicine

## 2022-02-26 ENCOUNTER — Encounter: Payer: Self-pay | Admitting: Internal Medicine

## 2022-02-26 DIAGNOSIS — R21 Rash and other nonspecific skin eruption: Secondary | ICD-10-CM | POA: Diagnosis not present

## 2022-02-26 NOTE — Patient Outreach (Signed)
  Care Coordination [facility] Note Transition Care Management Unsuccessful Follow-up Telephone Call  Date of discharge and from where:   [facility] [phone]  Attempts:  1st Attempt  Reason for unsuccessful TCM follow-up call:  Left voice message   [place] Management [phone]

## 2022-02-26 NOTE — Patient Outreach (Signed)
  Care Coordination [facility] Note Transition Care Management Follow-up Telephone Call Date of discharge and from where: [phone] How have you been since you were released from the hospital? Patient has developed a full body rash.  Any questions or concerns? Yes He went to Dr and tests are being done. Dr does not think rash is reaction to atbx  Items Reviewed: Did the pt receive and understand the discharge instructions provided? Yes  Medications obtained and verified? Yes  Other? No  Any new allergies since your discharge?  Unsure due to patient developed rash Dietary orders reviewed? Yes Do you have support at home? Yes   Home Care and Equipment/Supplies: Were home health services ordered? not applicable If so, what is the name of the agency? N   Has the agency set up a time to come to the patient's home? not applicable Were any new equipment or medical supplies ordered?  No What is the name of the medical supply agency? N  Were you able to get the supplies/equipment? not applicable Do you have any questions related to the use of the equipment or supplies? No  Functional Questionnaire: (I = Independent and D = Dependent) ADLs: d  Bathing/Dressing- d  Meal Prep- d  Eating- i  Maintaining continence- i  Transferring/Ambulation- i  Managing Meds- i  Follow up appointments reviewed:  PCP Hospital f/u appt confirmed? Yes  Patient went to see today.  [facility] f/u appt confirmed? Yes  Scheduled to see [name] Heilingoetter aug 16 3:00 pm Are transportation arrangements needed? No  If their condition worsens, is the pt aware to call PCP or go to the Emergency Dept.? Yes Was the patient provided with contact information for the PCP's office or ED? Yes Was to pt encouraged to call back with questions or concerns? Yes  SDOH assessments and interventions completed:   n   Care Coordination Interventions Activated:  Yes   Care Coordination Interventions:    N     Encounter Outcome:  Pt. Visit Completed    [facility] [phone]

## 2022-02-26 NOTE — Progress Notes (Signed)
Subjective:    Patient ID: Jorge Adams, male    DOB: 06-Nov-1950, 71 y.o.   MRN: [id number]  HPI Here due to rash on legs---just out of hospital yesterday  New rash on legs broke out this morning No pain No itching  Discharged yesterday for pneumonia On augmentin ---no prior PCN sensitivity Did take guaifenisin---chewed it (and wasn't supposed to)  Current Outpatient Medications on File Prior to Visit  Medication Sig Dispense Refill   albuterol (VENTOLIN HFA) 108 (90 Base) MCG/ACT inhaler Inhale 1-2 puffs into the lungs every 6 (six) hours as needed (for cough.  okay to fill with albuterol/proair/ventolin.). (Patient taking differently: Inhale 2 puffs into the lungs 3 (three) times daily as needed for shortness of breath.) 8 g 2   amoxicillin-clavulanate (AUGMENTIN) 875-125 MG tablet Take 1 tablet by mouth 2 (two) times daily. 6 tablet 0   aspirin EC 81 MG tablet Take 1 tablet (81 mg total) by mouth daily. Okay to restart this medication on 12/05/2021. 30 tablet 11   blood glucose meter kit and supplies Dispense based on pt and insurance preference. Use up to 3 times daily as directed. (Dx. E11.9). 1 each 12   Blood Pressure Monitoring (SPHYGMOMANOMETER) MISC Use daily to check BP.  Dx I10 1 each 0   brimonidine (ALPHAGAN P) 0.1 % SOLN Place 2 drops into both eyes 2 (two) times daily.     cilostazol (PLETAL) 100 MG tablet Take 1 tablet (100 mg total) by mouth 2 (two) times daily. Okay to restart this medication on 12/05/2021. 180 tablet 1   Cyanocobalamin (VITAMIN B12 PO) Take 1 tablet by mouth in the morning and at bedtime.     dorzolamide-timolol (COSOPT) 22.3-6.8 MG/ML ophthalmic solution Place 1 drop into both eyes 2 (two) times daily.     fenofibrate (TRICOR) 145 MG tablet TAKE 1 TABLET BY MOUTH EVERY DAY IN THE EVENING (Patient taking differently: Take 145 mg by mouth every evening.) 90 tablet 1   guaiFENesin (MUCINEX) 600 MG 12 hr tablet Take 1 tablet (600 mg total) by mouth  2 (two) times daily. 14 tablet 0   HYDROcodone bit-homatropine (HYCODAN) 5-1.5 MG/5ML syrup Take 5 mLs by mouth every 8 (eight) hours as needed for cough (sedation caution.). (Patient taking differently: Take 5 mLs by mouth 3 (three) times daily as needed for cough (sedation caution.).) 120 mL 0   ipratropium-albuterol (DUONEB) 0.5-2.5 (3) MG/3ML SOLN Take 3 mLs by nebulization every 6 (six) hours as needed. 360 mL 1   metFORMIN (GLUCOPHAGE) 500 MG tablet Take 1 tablet (500 mg total) by mouth 2 (two) times daily with a meal.     metoprolol tartrate (LOPRESSOR) 25 MG tablet Take 0.5 tablets (12.5 mg total) by mouth 2 (two) times daily. Hold for SBP less than 110 90 tablet 4   Netarsudil-Latanoprost (ROCKLATAN) 0.02-0.005 % SOLN Apply 1 drop to eye at bedtime. In both eyes     nitroGLYCERIN (NITROSTAT) 0.4 MG SL tablet Place 1 tablet (0.4 mg total) under the tongue every 5 (five) minutes as needed for chest pain. 25 tablet 5   omega-3 acid ethyl esters (LOVAZA) 1 g capsule TAKE 1 CAPSULE BY MOUTH TWICE A DAY (Patient taking differently: Take 1 g by mouth 2 (two) times daily.) 180 capsule 1   prochlorperazine (COMPAZINE) 10 MG tablet Take 1 tablet (10 mg total) by mouth every 6 (six) hours as needed for nausea or vomiting. (Patient taking differently: Take 10 mg by mouth 2 (  two) times daily as needed for nausea or vomiting.) 30 tablet 2   rosuvastatin (CRESTOR) 20 MG tablet TAKE 1 TABLET BY MOUTH EVERY DAY (Patient taking differently: Take 20 mg by mouth daily.) 90 tablet 0   sucralfate (CARAFATE) 1 g tablet Take 1 tablet (1 g total) by mouth 4 (four) times daily -  with meals and at bedtime. 5 min before meals for radiation induced esophagitis (Patient taking differently: Take 1 g by mouth See admin instructions. 2-3) 120 tablet 2   benzonatate (TESSALON) 200 MG capsule Take 1 capsule (200 mg total) by mouth 3 (three) times daily as needed. (Patient not taking: Reported on 02/26/2022) 30 capsule 3   No  current facility-administered medications on file prior to visit.    No Known Allergies  Past Medical History:  Diagnosis Date   Arthritis    ASHD (arteriosclerotic heart disease)    Atherosclerosis of native arteries of the extremities with intermittent claudication    Cataract    Coronary atherosclerosis of native coronary artery    Diabetes mellitus    NIDDM   Dyslipidemia    ED (erectile dysfunction)    Full dentures    Glaucoma associated with ocular disorder, mild stage    bilateral   Hyperlipidemia    Hypertension    under control, has been on med. > 9 yr.   Impotence of organic origin    Meniscus tear 05/2012   right knee   Myocardial infarction [facility]) 10/2002   Stented coronary artery     Past Surgical History:  Procedure Laterality Date   ANTERIOR CERVICAL DECOMP/DISCECTOMY FUSION N/A 05/07/2018   Procedure: ANTERIOR CERVICAL DECOMPRESSION/DISCECTOMY FUSION CERVICAL FIVE - CERVICAL SIX;  Surgeon: Consuella Lose, MD;  Location: [facility];  Service: Neurosurgery;  Laterality: N/A;  ANTERIOR CERVICAL DECOMPRESSION/DISCECTOMY FUSION CERVICAL FIVE - CERVICAL SIX   BRONCHIAL BIOPSY  12/04/2021   Procedure: BRONCHIAL BIOPSIES;  Surgeon: Collene Gobble, MD;  Location: [facility];  Service: Cardiopulmonary;;   BRONCHIAL BRUSHINGS  12/04/2021   Procedure: BRONCHIAL BRUSHINGS;  Surgeon: Collene Gobble, MD;  Location: [facility];  Service: Cardiopulmonary;;   BRONCHIAL NEEDLE ASPIRATION BIOPSY  12/04/2021   Procedure: BRONCHIAL NEEDLE ASPIRATION BIOPSIES;  Surgeon: Collene Gobble, MD;  Location: [facility];  Service: Cardiopulmonary;;   CARDIAC CATHETERIZATION  10/23/2002   had 1 stent placed then-- by Dr. Melvern Banker   CARDIAC CATHETERIZATION N/A 12/21/2015   Procedure: Left Heart Cath and Coronary Angiography;  Surgeon: Adrian Prows, MD;  Location: [facility];  Service: Cardiovascular;  Laterality: N/A;   CATARACT EXTRACTION Right 05/2017   CATARACT EXTRACTION Left  07/2017   COLONOSCOPY  10/08/2010   Dr.[place]   CORONARY ARTERY BYPASS GRAFT N/A 01/02/2016   Procedure: CORONARY ARTERY BYPASS GRAFTING (CABG) TIMES 3 USING LEFT INTERNAL MAMMARY ARTERY AND RIGHT SAPHENOUS LEG VEIN HARVESTED ENDOSCOPICALLY;  Surgeon: Ivin Poot, MD;  Location: [facility];  Service: Open Heart Surgery;  Laterality: N/A;   EYE SURGERY     INSERTION OF MESH N/A 05/06/2016   Procedure: INSERTION OF MESH;  Surgeon: Georganna Skeans, MD;  Location: [facility];  Service: General;  Laterality: N/A;   KNEE ARTHROSCOPY WITH MEDIAL MENISECTOMY  06/04/2012   Procedure: KNEE ARTHROSCOPY WITH MEDIAL MENISECTOMY;  Surgeon: Ninetta Lights, MD;  Location: [facility];  Service: Orthopedics;  Laterality: Right;  RIGHT SCOPE MEDIAL MENISCECTOMY, CHONDROPLASTY, EXCISION LOOSE BODY   LOWER EXTREMITY ANGIOGRAM N/A 11/23/2013   Procedure: LOWER EXTREMITY  ANGIOGRAM;  Surgeon: Laverda Page, MD;  Location[facility] CATH [facility];  Service: Cardiovascular;  Laterality: N/A;   NECK SURGERY N/A    02/19/2018   PERIPHERAL VASCULAR CATHETERIZATION N/A 12/21/2015   Procedure: Abdominal Aortogram;  Surgeon: Adrian Prows, MD;  Location: [facility];  Service: Cardiovascular;  Laterality: N/A;   PERIPHERAL VASCULAR CATHETERIZATION N/A 12/21/2015   Procedure: Abdominal Aortogram w/Lower Extremity;  Surgeon: Adrian Prows, MD;  Location: [facility];  Service: Cardiovascular;  Laterality: N/A;   TEE WITHOUT CARDIOVERSION N/A 01/02/2016   Procedure: TRANSESOPHAGEAL ECHOCARDIOGRAM (TEE);  Surgeon: Ivin Poot, MD;  Location: [facility];  Service: Open Heart Surgery;  Laterality: N/A;   UMBILICAL HERNIA REPAIR N/A 05/06/2016   Procedure: HERNIA REPAIR UMBILICAL ADULT;  Surgeon: Georganna Skeans, MD;  Location: [facility];  Service: General;  Laterality: N/A;   VIDEO BRONCHOSCOPY N/A 12/04/2021   Procedure: VIDEO BRONCHOSCOPY WITH FLUORO;  Surgeon: Collene Gobble, MD;  Location: [facility];  Service:  Cardiopulmonary;  Laterality: N/A;    Family History  Problem Relation Age of Onset   Bone cancer Mother    Pneumonia Father    Diabetes Brother    Heart disease Brother        Pacemaker   COPD Brother    Colon cancer Neg Hx    Prostate cancer Neg Hx    Esophageal cancer Neg Hx    Stomach cancer Neg Hx     Social History   Socioeconomic History   Marital status: Married    Spouse name: Not on file   Number of children: 2   Years of education: Not on file   Highest education level: Not on file  Occupational History   Occupation: retired  Tobacco Use   Smoking status: Former    Packs/day: 2.50    Years: 35.00    Total pack years: 87.50    Types: Cigarettes    Quit date: 12/02/2002    Years since quitting: 19.2   Smokeless tobacco: Never  Vaping Use   Vaping Use: Never used  Substance and Sexual Activity   Alcohol use: Yes    Comment: rare   Drug use: No   Sexual activity: Not on file  Other Topics Concern   Not on file  Social History Narrative   Retired Librarian, 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    Doristine Bosworth, [employer] fan   Social Determinants of Health   Financial Resource Strain: Low Risk  (01/31/2022)   Overall Financial Resource Strain (CARDIA)    Difficulty of Paying Living Expenses: Not hard at all  Food Insecurity: No Food Insecurity (01/31/2022)   Hunger Vital Sign    Worried About Running Out of Food in the Last Year: Never true    Ran Out of Food in the Last Year: Never true  Transportation Needs: No Transportation Needs (01/31/2022)   PRAPARE - Hydrologist (Medical): No    Lack of Transportation (Non-Medical): No  Physical Activity: Unknown (01/31/2022)   Exercise Vital Sign    Days of Exercise per Week: Patient refused    Minutes of Exercise per Session: Patient refused  Stress: Stress Concern Present (01/31/2022)   [place]    Feeling of Stress : To some extent  Social Connections: Moderately Integrated (01/31/2022)  Social Licensed conveyancer [NHANES]    Frequency of Communication with Friends and Family: More than three times a week    Frequency of Social Gatherings with Friends and Family: Never    Attends Religious Services: More than 4 times per year    Active Member of [employer] or Organizations: No    Attends Archivist Meetings: Never    Marital Status: Married  Human resources officer Violence: Not At Risk (01/31/2022)   Humiliation, Afraid, Rape, and Kick questionnaire    Fear of Current or Ex-Partner: No    Emotionally Abused: No    Physically Abused: No    Sexually Abused: No   Review of Systems Ongoing cough--brings up mucus (stuck at times) No fever Still with some SOB---but now back off oxygen  Now has nebulizer for home---did use last night (duoneb)    Objective:   Physical Exam Constitutional:      Appearance: Normal appearance.  Skin:    Comments: Non blanching macular red spots (petechial) on legs and low back Not tender (not vasculitic) Small ulcer with eschar on left lateral malleolus---surrounding redness without looking inflamed  Neurological:     Mental Status: He is alert.            Assessment & Plan:

## 2022-02-26 NOTE — Assessment & Plan Note (Signed)
Looks petechial Platelet count 118K 2 days ago Doesn't look like drug rash ?reaction to blood transfusion Not vasculitis  Recommended he continue the last 2 days of augmentin Will need to review with oncologist

## 2022-02-27 ENCOUNTER — Telehealth: Payer: Self-pay | Admitting: Family Medicine

## 2022-02-27 ENCOUNTER — Telehealth: Payer: Self-pay

## 2022-02-27 LAB — CULTURE, BLOOD (ROUTINE X 2)
Culture: NO GROWTH
Culture: NO GROWTH
Special Requests: ADEQUATE
Special Requests: ADEQUATE

## 2022-02-27 NOTE — Telephone Encounter (Signed)
Discussed with wife He is coughing up lots of mucus at night---even enough to choke on No fever and breathing is okay Rash has spread---makes it more likely to be drug reaction  Asked her to have him stop the augmentin If increased SOB, etc---take him to ER  [place], Check on him tomorrow morning. If doesn't need ER, I will recheck him at 1:45PM

## 2022-02-27 NOTE — Telephone Encounter (Signed)
Patient wife Jorge Adams called in stating that Jorge Adams was seen by Dr. Silvio Pate yesterday 02/26/22 for a rash on his legs. This morning he woke up with rash all over and coughing up mucus. She stated that Dr. Silvio Pate doesn't think its related to any medication that he is taking but possibly something from the blood he had received Saturday night while in the hospital, woke up Tuesday morning with the rash on his leg, and today its all over. Jorge Adams contacted his Oncologist and they stated nothing to do with them, because he hasn't had any chemo or treatments in two weeks and referred him back to Dr. Damita Dunnings. Jorge Adams was wanting somebody to contact to her so she can know what to do. She can contacted at [phone]. Please advise. Thank you!

## 2022-02-27 NOTE — Telephone Encounter (Signed)
Pts wife called stating pts PCP contacted Dr. Julien Nordmann and advised her to call pts oncologist regarding his rash if there was no improvement and there has been no improvement. Pts wife Juliann Pulse further explains that pt was in the hospital and received a blood transfusion (02/23/22). She states pts PCP prescribed antibiotics yesterday by his PCP.  Discussed with Dr. Julien Nordmann who advised he responded to pts PCP advising he should monitor it for now and can used hydrocortisone cream. Also, if the rash worsened, he should follow-up with his PCP or present to the ER.  I have shared this information with pts wife who confirmed she understood this information.

## 2022-02-28 ENCOUNTER — Encounter: Payer: Self-pay | Admitting: Internal Medicine

## 2022-02-28 ENCOUNTER — Ambulatory Visit (INDEPENDENT_AMBULATORY_CARE_PROVIDER_SITE_OTHER): Payer: Medicare HMO | Admitting: Internal Medicine

## 2022-02-28 DIAGNOSIS — J181 Lobar pneumonia, unspecified organism: Secondary | ICD-10-CM

## 2022-02-28 DIAGNOSIS — T8069XA Other serum reaction due to other serum, initial encounter: Secondary | ICD-10-CM | POA: Diagnosis not present

## 2022-02-28 MED ORDER — PREDNISONE 20 MG PO TABS: 40.0000 mg | ORAL_TABLET | Freq: Every day | ORAL | 0 refills | Status: DC

## 2022-02-28 MED ORDER — CLINDAMYCIN HCL 300 MG PO CAPS: 300.0000 mg | ORAL_CAPSULE | Freq: Three times a day (TID) | ORAL | 0 refills | Status: DC

## 2022-02-28 NOTE — Assessment & Plan Note (Signed)
Could be from the IV meds in hospital--but may be the augmentin He stopped this yesterday Will give steroid burst 40mg  x 5 then 20mg  x 5

## 2022-02-28 NOTE — Assessment & Plan Note (Signed)
In a cavity from apparent radiation damage Ongoing symptoms Will change to clindamycin to continue anaerobic coverage Cautioned to stop if any diarrhea

## 2022-02-28 NOTE — Progress Notes (Signed)
Subjective:    Patient ID: Jorge Adams, male    DOB: 03-25-1951, 71 y.o.   MRN: [id number]  HPI Here with wife for ongoing cough and rash  Rash is now worse They did stop the augmentin and mucinex yesterday  No oral lesions No fever No joint swelling  Ongoing cough They show me slightly bloody whitish sputum Cough is really bad at night Only mild SOB  Current Outpatient Medications on File Prior to Visit  Medication Sig Dispense Refill   albuterol (VENTOLIN HFA) 108 (90 Base) MCG/ACT inhaler Inhale 1-2 puffs into the lungs every 6 (six) hours as needed (for cough.  okay to fill with albuterol/proair/ventolin.). (Patient taking differently: Inhale 2 puffs into the lungs 3 (three) times daily as needed for shortness of breath.) 8 g 2   aspirin EC 81 MG tablet Take 1 tablet (81 mg total) by mouth daily. Okay to restart this medication on 12/05/2021. 30 tablet 11   benzonatate (TESSALON) 200 MG capsule Take 1 capsule (200 mg total) by mouth 3 (three) times daily as needed. 30 capsule 3   blood glucose meter kit and supplies Dispense based on pt and insurance preference. Use up to 3 times daily as directed. (Dx. E11.9). 1 each 12   Blood Pressure Monitoring (SPHYGMOMANOMETER) MISC Use daily to check BP.  Dx I10 1 each 0   brimonidine (ALPHAGAN P) 0.1 % SOLN Place 2 drops into both eyes 2 (two) times daily.     cilostazol (PLETAL) 100 MG tablet Take 1 tablet (100 mg total) by mouth 2 (two) times daily. Okay to restart this medication on 12/05/2021. 180 tablet 1   Cyanocobalamin (VITAMIN B12 PO) Take 1 tablet by mouth in the morning and at bedtime.     dorzolamide-timolol (COSOPT) 22.3-6.8 MG/ML ophthalmic solution Place 1 drop into both eyes 2 (two) times daily.     fenofibrate (TRICOR) 145 MG tablet TAKE 1 TABLET BY MOUTH EVERY DAY IN THE EVENING (Patient taking differently: Take 145 mg by mouth every evening.) 90 tablet 1   HYDROcodone bit-homatropine (HYCODAN) 5-1.5 MG/5ML syrup  Take 5 mLs by mouth every 8 (eight) hours as needed for cough (sedation caution.). (Patient taking differently: Take 5 mLs by mouth 3 (three) times daily as needed for cough (sedation caution.).) 120 mL 0   ipratropium-albuterol (DUONEB) 0.5-2.5 (3) MG/3ML SOLN Take 3 mLs by nebulization every 6 (six) hours as needed. 360 mL 1   metFORMIN (GLUCOPHAGE) 500 MG tablet Take 1 tablet (500 mg total) by mouth 2 (two) times daily with a meal.     metoprolol tartrate (LOPRESSOR) 25 MG tablet Take 0.5 tablets (12.5 mg total) by mouth 2 (two) times daily. Hold for SBP less than 110 90 tablet 4   Netarsudil-Latanoprost (ROCKLATAN) 0.02-0.005 % SOLN Apply 1 drop to eye at bedtime. In both eyes     nitroGLYCERIN (NITROSTAT) 0.4 MG SL tablet Place 1 tablet (0.4 mg total) under the tongue every 5 (five) minutes as needed for chest pain. 25 tablet 5   omega-3 acid ethyl esters (LOVAZA) 1 g capsule TAKE 1 CAPSULE BY MOUTH TWICE A DAY (Patient taking differently: Take 1 g by mouth 2 (two) times daily.) 180 capsule 1   prochlorperazine (COMPAZINE) 10 MG tablet Take 1 tablet (10 mg total) by mouth every 6 (six) hours as needed for nausea or vomiting. (Patient taking differently: Take 10 mg by mouth 2 (two) times daily as needed for nausea or vomiting.) 30 tablet 2  rosuvastatin (CRESTOR) 20 MG tablet TAKE 1 TABLET BY MOUTH EVERY DAY (Patient taking differently: Take 20 mg by mouth daily.) 90 tablet 0   sucralfate (CARAFATE) 1 g tablet Take 1 tablet (1 g total) by mouth 4 (four) times daily -  with meals and at bedtime. 5 min before meals for radiation induced esophagitis (Patient taking differently: Take 1 g by mouth See admin instructions. 2-3) 120 tablet 2   amoxicillin-clavulanate (AUGMENTIN) 875-125 MG tablet Take 1 tablet by mouth 2 (two) times daily. (Patient not taking: Reported on 02/28/2022) 6 tablet 0   guaiFENesin (MUCINEX) 600 MG 12 hr tablet Take 1 tablet (600 mg total) by mouth 2 (two) times daily. (Patient not  taking: Reported on 02/28/2022) 14 tablet 0   No current facility-administered medications on file prior to visit.    No Known Allergies  Past Medical History:  Diagnosis Date   Arthritis    ASHD (arteriosclerotic heart disease)    Atherosclerosis of native arteries of the extremities with intermittent claudication    Cataract    Coronary atherosclerosis of native coronary artery    Diabetes mellitus    NIDDM   Dyslipidemia    ED (erectile dysfunction)    Full dentures    Glaucoma associated with ocular disorder, mild stage    bilateral   Hyperlipidemia    Hypertension    under control, has been on med. > 9 yr.   Impotence of organic origin    Meniscus tear 05/2012   right knee   Myocardial infarction [facility]) 10/2002   Stented coronary artery     Past Surgical History:  Procedure Laterality Date   ANTERIOR CERVICAL DECOMP/DISCECTOMY FUSION N/A 05/07/2018   Procedure: ANTERIOR CERVICAL DECOMPRESSION/DISCECTOMY FUSION CERVICAL FIVE - CERVICAL SIX;  Surgeon: Consuella Lose, MD;  Location: [place];  Service: Neurosurgery;  Laterality: N/A;  ANTERIOR CERVICAL DECOMPRESSION/DISCECTOMY FUSION CERVICAL FIVE - CERVICAL SIX   BRONCHIAL BIOPSY  12/04/2021   Procedure: BRONCHIAL BIOPSIES;  Surgeon: Collene Gobble, MD;  Location: [place];  Service: Cardiopulmonary;;   BRONCHIAL BRUSHINGS  12/04/2021   Procedure: BRONCHIAL BRUSHINGS;  Surgeon: Collene Gobble, MD;  Location: [place];  Service: Cardiopulmonary;;   BRONCHIAL NEEDLE ASPIRATION BIOPSY  12/04/2021   Procedure: BRONCHIAL NEEDLE ASPIRATION BIOPSIES;  Surgeon: Collene Gobble, MD;  Location: [place];  Service: Cardiopulmonary;;   CARDIAC CATHETERIZATION  10/23/2002   had 1 stent placed then-- by Dr. Melvern Banker   CARDIAC CATHETERIZATION N/A 12/21/2015   Procedure: Left Heart Cath and Coronary Angiography;  Surgeon: Adrian Prows, MD;  Location: [place] [facility];  Service: Cardiovascular;  Laterality: N/A;   CATARACT  EXTRACTION Right 05/2017   CATARACT EXTRACTION Left 07/2017   COLONOSCOPY  10/08/2010   Dr.Patterson   CORONARY ARTERY BYPASS GRAFT N/A 01/02/2016   Procedure: CORONARY ARTERY BYPASS GRAFTING (CABG) TIMES 3 USING LEFT INTERNAL MAMMARY ARTERY AND RIGHT SAPHENOUS LEG VEIN HARVESTED ENDOSCOPICALLY;  Surgeon: Ivin Poot, MD;  Location: [place];  Service: Open Heart Surgery;  Laterality: N/A;   EYE SURGERY     INSERTION OF MESH N/A 05/06/2016   Procedure: INSERTION OF MESH;  Surgeon: Georganna Skeans, MD;  Location: [place];  Service: General;  Laterality: N/A;   KNEE ARTHROSCOPY WITH MEDIAL MENISECTOMY  06/04/2012   Procedure: KNEE ARTHROSCOPY WITH MEDIAL MENISECTOMY;  Surgeon: Ninetta Lights, MD;  Location: [place];  Service: Orthopedics;  Laterality: Right;  RIGHT SCOPE MEDIAL MENISCECTOMY, CHONDROPLASTY, EXCISION LOOSE BODY  LOWER EXTREMITY ANGIOGRAM N/A 11/23/2013   Procedure: LOWER EXTREMITY ANGIOGRAM;  Surgeon: Laverda Page, MD;  Location: [facility];  Service: Cardiovascular;  Laterality: N/A;   NECK SURGERY N/A    02/19/2018   PERIPHERAL VASCULAR CATHETERIZATION N/A 12/21/2015   Procedure: Abdominal Aortogram;  Surgeon: Adrian Prows, MD;  Location: [place] [facility];  Service: Cardiovascular;  Laterality: N/A;   PERIPHERAL VASCULAR CATHETERIZATION N/A 12/21/2015   Procedure: Abdominal Aortogram w/Lower Extremity;  Surgeon: Adrian Prows, MD;  Location: [place] [facility];  Service: Cardiovascular;  Laterality: N/A;   TEE WITHOUT CARDIOVERSION N/A 01/02/2016   Procedure: TRANSESOPHAGEAL ECHOCARDIOGRAM (TEE);  Surgeon: Ivin Poot, MD;  Location: [place];  Service: Open Heart Surgery;  Laterality: N/A;   UMBILICAL HERNIA REPAIR N/A 05/06/2016   Procedure: HERNIA REPAIR UMBILICAL ADULT;  Surgeon: Georganna Skeans, MD;  Location: [place];  Service: General;  Laterality: N/A;   VIDEO BRONCHOSCOPY N/A 12/04/2021   Procedure: VIDEO BRONCHOSCOPY WITH FLUORO;  Surgeon: Collene Gobble, MD;  Location: [place];  Service: Cardiopulmonary;  Laterality: N/A;    Family History  Problem Relation Age of Onset   Bone cancer Mother    Pneumonia Father    Diabetes Brother    Heart disease Brother        Pacemaker   COPD Brother    Colon cancer Neg Hx    Prostate cancer Neg Hx    Esophageal cancer Neg Hx    Stomach cancer Neg Hx     Social History   Socioeconomic History   Marital status: Married    Spouse name: Not on file   Number of children: 2   Years of education: Not on file   Highest education level: Not on file  Occupational History   Occupation: retired  Tobacco Use   Smoking status: Former    Packs/day: 2.50    Years: 35.00    Total pack years: 87.50    Types: Cigarettes    Quit date: 12/02/2002    Years since quitting: 19.2   Smokeless tobacco: Never  Vaping Use   Vaping Use: Never used  Substance and Sexual Activity   Alcohol use: Yes    Comment: rare   Drug use: No   Sexual activity: Not on file  Other Topics Concern   Not on file  Social History Narrative   Retired Librarian, 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    Doristine Bosworth, [employer] fan   Social Determinants of Health   Financial Resource Strain: Low Risk  (01/31/2022)   Overall Financial Resource Strain (CARDIA)    Difficulty of Paying Living Expenses: Not hard at all  Food Insecurity: No Food Insecurity (01/31/2022)   Hunger Vital Sign    Worried About Running Out of Food in the Last Year: Never true    Ran Out of Food in the Last Year: Never true  Transportation Needs: No Transportation Needs (01/31/2022)   PRAPARE - Hydrologist (Medical): No    Lack of Transportation (Non-Medical): No  Physical Activity: Unknown (01/31/2022)   Exercise Vital Sign    Days of Exercise per Week: Patient refused    Minutes of Exercise per Session: Patient refused   Stress: Stress Concern Present (01/31/2022)   [place]    Feeling of Stress : To  some extent  Social Connections: Moderately Integrated (01/31/2022)   Social Connection and Isolation Panel [NHANES]    Frequency of Communication with Friends and Family: More than three times a week    Frequency of Social Gatherings with Friends and Family: Never    Attends Religious Services: More than 4 times per year    Active Member of [employer] or Organizations: No    Attends Archivist Meetings: Never    Marital Status: Married  Human resources officer Violence: Not At Risk (01/31/2022)   Humiliation, Afraid, Rape, and Kick questionnaire    Fear of Current or Ex-Partner: No    Emotionally Abused: No    Physically Abused: No    Sexually Abused: No   Review of Systems Not eating much Is tolerating protein shakes and electrolyte solutions     Objective:   Physical Exam Constitutional:      General: He is not in acute distress. HENT:     Mouth/Throat:     Comments: No swelling but has palatal petechiae Cardiovascular:     Rate and Rhythm: Normal rate and regular rhythm.     Heart sounds: No murmur heard.    No gallop.  Pulmonary:     Effort: Pulmonary effort is normal.     Comments: Some coarse cough Decreased breath sounds on right side Musculoskeletal:     Right lower leg: No edema.     Left lower leg: No edema.     Comments: No joint swelling  Skin:    Comments: Worsening purpural rash--mostly on legs but some on back  Neurological:     Mental Status: He is alert.            Assessment & Plan:

## 2022-03-01 ENCOUNTER — Other Ambulatory Visit (INDEPENDENT_AMBULATORY_CARE_PROVIDER_SITE_OTHER): Payer: Medicare HMO

## 2022-03-01 ENCOUNTER — Telehealth: Payer: Self-pay

## 2022-03-01 ENCOUNTER — Other Ambulatory Visit: Payer: Self-pay

## 2022-03-01 DIAGNOSIS — T8069XA Other serum reaction due to other serum, initial encounter: Secondary | ICD-10-CM

## 2022-03-01 DIAGNOSIS — J181 Lobar pneumonia, unspecified organism: Secondary | ICD-10-CM | POA: Diagnosis not present

## 2022-03-01 LAB — CBC
HCT: 24.2 % — ABNORMAL LOW (ref 39.0–52.0)
Hemoglobin: 8.4 g/dL — ABNORMAL LOW (ref 13.0–17.0)
MCHC: 35 g/dL (ref 30.0–36.0)
MCV: 79.7 fl (ref 78.0–100.0)
Platelets: 146 10*3/uL — ABNORMAL LOW (ref 150.0–400.0)
RBC: 3.03 Mil/uL — ABNORMAL LOW (ref 4.22–5.81)
RDW: 20.7 % — ABNORMAL HIGH (ref 11.5–15.5)
WBC: 4 10*3/uL (ref 4.0–10.5)

## 2022-03-01 NOTE — Progress Notes (Unsigned)
Jorge Adams OFFICE PROGRESS NOTE  Jorge Ghent, MD [place] [address]  DIAGNOSIS: Stage IIIb (T4, N0/N2, M0) non-small cell lung cancer, squamous cell carcinoma presented with large right lower lobe lung mass in addition to satellite nodules close to the mass and suspicious mediastinal lymphadenopathy diagnosed in May 2023   PD-L1 expression 1%  PRIOR THERAPY:  A course of concurrent chemoradiation with weekly carboplatin for AUC of 2 and paclitaxel 45 Mg/M2.  Last dose 02/04/22.  Status post 6 cycles.  CURRENT THERAPY: Validation immunotherapy (100 mg IV every 4 weeks.  First dose expected next week on 03/13/2022.  INTERVAL HISTORY: Jorge Adams 71 y.o. male returns to clinic today for follow-up visit accompanied by his wife.  The patient recently completed a course of concurrent chemoradiation.  In the interval since last being seen, the patient was seen in the emergency room x2 for the chief complaint of shortness of breath.  He had a negative CT PE study on his first ER visit.  Since being discharged, the patient has had a decline with decreased p.o. intake and worsening generalized weakness.  He had a hard time getting off the toilet the day of his second emergency room visit on 8//2023 and called EMS.  Patient was tachycardic at 130 and mildly hypotensive.  He had a CT of the chest, abdomen, pelvis performed which showed the previously described right lower lobe mass responded to treatment.  There is also increased surrounding peribronchial vascular nodularity and groundglass opacities in the right middle lobe and right lower lobes which may be secondary to postobstructive pneumonitis and follow-up was recommended.  He was admitted to the hospital and treated with IV antibiotics for possible pneumonia?  He was discharged home and developed a rash which was thought to be secondary to Augmentin.  Since being discharged in the hospital the patient  is feeling ***.  Denies any fever, chills, or night sweats.  Weight loss?  Appetite?  Swallowing?  He was evaluated with a swallow study during his hospital admission which limited study without gross esophageal abnormality.  Although the sensitivity was markedly decreased by piecemeal swallowing and the patient declined a 13 mm barium tablet.  Shortness of breath?  Cough?  Denies any hemoptysis or chest pain.  Denies any nausea, vomiting, diarrhea, or constipation denies any headache or visual changes.  Rash?  The patient recently had a restaging CT scan performed.  Patient is here today for evaluation and to review his scan results and for more detailed discussion for his current condition and recommended treatment options.    MEDICAL HISTORY: Past Medical History:  Diagnosis Date   Arthritis    ASHD (arteriosclerotic heart disease)    Atherosclerosis of native arteries of the extremities with intermittent claudication    Cataract    Coronary atherosclerosis of native coronary artery    Diabetes mellitus    NIDDM   Dyslipidemia    ED (erectile dysfunction)    Full dentures    Glaucoma associated with ocular disorder, mild stage    bilateral   Hyperlipidemia    Hypertension    under control, has been on med. > 9 yr.   Impotence of organic origin    Meniscus tear 05/2012   right knee   Myocardial infarction [facility]) 10/2002   Stented coronary artery     ALLERGIES:  has No Known Allergies.  MEDICATIONS:  Current Outpatient Medications  Medication Sig Dispense Refill  albuterol (VENTOLIN HFA) 108 (90 Base) MCG/ACT inhaler Inhale 1-2 puffs into the lungs every 6 (six) hours as needed (for cough.  okay to fill with albuterol/proair/ventolin.). (Patient taking differently: Inhale 2 puffs into the lungs 3 (three) times daily as needed for shortness of breath.) 8 g 2   aspirin EC 81 MG tablet Take 1 tablet (81 mg total) by mouth daily. Okay to restart this medication on 12/05/2021. 30 tablet  11   benzonatate (TESSALON) 200 MG capsule Take 1 capsule (200 mg total) by mouth 3 (three) times daily as needed. 30 capsule 3   blood glucose meter kit and supplies Dispense based on pt and insurance preference. Use up to 3 times daily as directed. (Dx. E11.9). 1 each 12   Blood Pressure Monitoring (SPHYGMOMANOMETER) MISC Use daily to check BP.  Dx I10 1 each 0   brimonidine (ALPHAGAN P) 0.1 % SOLN Place 2 drops into both eyes 2 (two) times daily.     cilostazol (PLETAL) 100 MG tablet Take 1 tablet (100 mg total) by mouth 2 (two) times daily. Okay to restart this medication on 12/05/2021. 180 tablet 1   clindamycin (CLEOCIN) 300 MG capsule Take 1 capsule (300 mg total) by mouth 3 (three) times daily. 30 capsule 0   Cyanocobalamin (VITAMIN B12 PO) Take 1 tablet by mouth in the morning and at bedtime.     dorzolamide-timolol (COSOPT) 22.3-6.8 MG/ML ophthalmic solution Place 1 drop into both eyes 2 (two) times daily.     fenofibrate (TRICOR) 145 MG tablet TAKE 1 TABLET BY MOUTH EVERY DAY IN THE EVENING (Patient taking differently: Take 145 mg by mouth every evening.) 90 tablet 1   guaiFENesin (MUCINEX) 600 MG 12 hr tablet Take 1 tablet (600 mg total) by mouth 2 (two) times daily. (Patient not taking: Reported on 02/28/2022) 14 tablet 0   HYDROcodone bit-homatropine (HYCODAN) 5-1.5 MG/5ML syrup Take 5 mLs by mouth every 8 (eight) hours as needed for cough (sedation caution.). (Patient taking differently: Take 5 mLs by mouth 3 (three) times daily as needed for cough (sedation caution.).) 120 mL 0   ipratropium-albuterol (DUONEB) 0.5-2.5 (3) MG/3ML SOLN Take 3 mLs by nebulization every 6 (six) hours as needed. 360 mL 1   metFORMIN (GLUCOPHAGE) 500 MG tablet Take 1 tablet (500 mg total) by mouth 2 (two) times daily with a meal.     metoprolol tartrate (LOPRESSOR) 25 MG tablet Take 0.5 tablets (12.5 mg total) by mouth 2 (two) times daily. Hold for SBP less than 110 90 tablet 4   Netarsudil-Latanoprost  (ROCKLATAN) 0.02-0.005 % SOLN Apply 1 drop to eye at bedtime. In both eyes     nitroGLYCERIN (NITROSTAT) 0.4 MG SL tablet Place 1 tablet (0.4 mg total) under the tongue every 5 (five) minutes as needed for chest pain. 25 tablet 5   omega-3 acid ethyl esters (LOVAZA) 1 g capsule TAKE 1 CAPSULE BY MOUTH TWICE A DAY (Patient taking differently: Take 1 g by mouth 2 (two) times daily.) 180 capsule 1   predniSONE (DELTASONE) 20 MG tablet Take 2 tablets (40 mg total) by mouth daily. For 5 days then 1 tab daily for 5 days 15 tablet 0   prochlorperazine (COMPAZINE) 10 MG tablet Take 1 tablet (10 mg total) by mouth every 6 (six) hours as needed for nausea or vomiting. (Patient taking differently: Take 10 mg by mouth 2 (two) times daily as needed for nausea or vomiting.) 30 tablet 2   rosuvastatin (CRESTOR) 20 MG tablet  TAKE 1 TABLET BY MOUTH EVERY DAY (Patient taking differently: Take 20 mg by mouth daily.) 90 tablet 0   sucralfate (CARAFATE) 1 g tablet Take 1 tablet (1 g total) by mouth 4 (four) times daily -  with meals and at bedtime. 5 min before meals for radiation induced esophagitis (Patient taking differently: Take 1 g by mouth See admin instructions. 2-3) 120 tablet 2   No current facility-administered medications for this visit.    SURGICAL HISTORY:  Past Surgical History:  Procedure Laterality Date   ANTERIOR CERVICAL DECOMP/DISCECTOMY FUSION N/A 05/07/2018   Procedure: ANTERIOR CERVICAL DECOMPRESSION/DISCECTOMY FUSION CERVICAL FIVE - CERVICAL SIX;  Surgeon: Consuella Lose, MD;  Location: [place];  Service: Neurosurgery;  Laterality: N/A;  ANTERIOR CERVICAL DECOMPRESSION/DISCECTOMY FUSION CERVICAL FIVE - CERVICAL SIX   BRONCHIAL BIOPSY  12/04/2021   Procedure: BRONCHIAL BIOPSIES;  Surgeon: Collene Gobble, MD;  Location: [place];  Service: Cardiopulmonary;;   BRONCHIAL BRUSHINGS  12/04/2021   Procedure: BRONCHIAL BRUSHINGS;  Surgeon: Collene Gobble, MD;  Location: [place];  Service:  Cardiopulmonary;;   BRONCHIAL NEEDLE ASPIRATION BIOPSY  12/04/2021   Procedure: BRONCHIAL NEEDLE ASPIRATION BIOPSIES;  Surgeon: Collene Gobble, MD;  Location: [place];  Service: Cardiopulmonary;;   CARDIAC CATHETERIZATION  10/23/2002   had 1 stent placed then-- by Dr. Melvern Banker   CARDIAC CATHETERIZATION N/A 12/21/2015   Procedure: Left Heart Cath and Coronary Angiography;  Surgeon: Adrian Prows, MD;  Location: [place] [facility];  Service: Cardiovascular;  Laterality: N/A;   CATARACT EXTRACTION Right 05/2017   CATARACT EXTRACTION Left 07/2017   COLONOSCOPY  10/08/2010   Dr.Patterson   CORONARY ARTERY BYPASS GRAFT N/A 01/02/2016   Procedure: CORONARY ARTERY BYPASS GRAFTING (CABG) TIMES 3 USING LEFT INTERNAL MAMMARY ARTERY AND RIGHT SAPHENOUS LEG VEIN HARVESTED ENDOSCOPICALLY;  Surgeon: Ivin Poot, MD;  Location: [place];  Service: Open Heart Surgery;  Laterality: N/A;   EYE SURGERY     INSERTION OF MESH N/A 05/06/2016   Procedure: INSERTION OF MESH;  Surgeon: Georganna Skeans, MD;  Location: [place];  Service: General;  Laterality: N/A;   KNEE ARTHROSCOPY WITH MEDIAL MENISECTOMY  06/04/2012   Procedure: KNEE ARTHROSCOPY WITH MEDIAL MENISECTOMY;  Surgeon: Ninetta Lights, MD;  Location: [place];  Service: Orthopedics;  Laterality: Right;  RIGHT SCOPE MEDIAL MENISCECTOMY, CHONDROPLASTY, EXCISION LOOSE BODY   LOWER EXTREMITY ANGIOGRAM N/A 11/23/2013   Procedure: LOWER EXTREMITY ANGIOGRAM;  Surgeon: Laverda Page, MD;  Location: [facility] C[facility];  Service: Cardiovascular;  Laterality: N/A;   NECK SURGERY N/A    02/19/2018   PERIPHERAL VASCULAR CATHETERIZATION N/A 12/21/2015   Procedure: Abdominal Aortogram;  Surgeon: Adrian Prows, MD;  Location: [place] [facility];  Service: Cardiovascular;  Laterality: N/A;   PERIPHERAL VASCULAR CATHETERIZATION N/A 12/21/2015   Procedure: Abdominal Aortogram w/Lower Extremity;  Surgeon: Adrian Prows, MD;  Location: [place] [facility];  Service:  Cardiovascular;  Laterality: N/A;   TEE WITHOUT CARDIOVERSION N/A 01/02/2016   Procedure: TRANSESOPHAGEAL ECHOCARDIOGRAM (TEE);  Surgeon: Ivin Poot, MD;  Location: [place];  Service: Open Heart Surgery;  Laterality: N/A;   UMBILICAL HERNIA REPAIR N/A 05/06/2016   Procedure: HERNIA REPAIR UMBILICAL ADULT;  Surgeon: Georganna Skeans, MD;  Location: [place];  Service: General;  Laterality: N/A;   VIDEO BRONCHOSCOPY N/A 12/04/2021   Procedure: VIDEO BRONCHOSCOPY WITH FLUORO;  Surgeon: Collene Gobble, MD;  Location: [place];  Service: Cardiopulmonary;  Laterality: N/A;    REVIEW OF  SYSTEMS:   Review of Systems  Constitutional: Negative for appetite change, chills, fatigue, fever and unexpected weight change.  HENT:   Negative for mouth sores, nosebleeds, sore throat and trouble swallowing.   Eyes: Negative for eye problems and icterus.  Respiratory: Negative for cough, hemoptysis, shortness of breath and wheezing.   Cardiovascular: Negative for chest pain and leg swelling.  Gastrointestinal: Negative for abdominal pain, constipation, diarrhea, nausea and vomiting.  Genitourinary: Negative for bladder incontinence, difficulty urinating, dysuria, frequency and hematuria.   Musculoskeletal: Negative for back pain, gait problem, neck pain and neck stiffness.  Skin: Negative for itching and rash.  Neurological: Negative for dizziness, extremity weakness, gait problem, headaches, light-headedness and seizures.  Hematological: Negative for adenopathy. Does not bruise/bleed easily.  Psychiatric/Behavioral: Negative for confusion, depression and sleep disturbance. The patient is not nervous/anxious.     PHYSICAL EXAMINATION:  There were no vitals taken for this visit.  ECOG PERFORMANCE STATUS: {CHL ONC ECOG [id number]  Physical Exam  Constitutional: Oriented to person, place, and time and well-developed, well-nourished, and in no distress. No distress.  HENT:  Head: Normocephalic and  atraumatic.  Mouth/Throat: Oropharynx is clear and moist. No oropharyngeal exudate.  Eyes: Conjunctivae are normal. Right eye exhibits no discharge. Left eye exhibits no discharge. No scleral icterus.  Neck: Normal range of motion. Neck supple.  Cardiovascular: Normal rate, regular rhythm, normal heart sounds and intact distal pulses.   Pulmonary/Chest: Effort normal and breath sounds normal. No respiratory distress. No wheezes. No rales.  Abdominal: Soft. Bowel sounds are normal. Exhibits no distension and no mass. There is no tenderness.  Musculoskeletal: Normal range of motion. Exhibits no edema.  Lymphadenopathy:    No cervical adenopathy.  Neurological: Alert and oriented to person, place, and time. Exhibits normal muscle tone. Gait normal. Coordination normal.  Skin: Skin is warm and dry. No rash noted. Not diaphoretic. No erythema. No pallor.  Psychiatric: Mood, memory and judgment normal.  Vitals reviewed.  LABORATORY DATA: Lab Results  Component Value Date   WBC 4.1 02/24/2022   HGB 7.4 (L) 02/24/2022   HCT 25.0 (L) 02/24/2022   MCV 89.0 02/24/2022   PLT 118 (L) 02/24/2022      Chemistry      Component Value Date/Time   NA 133 (L) 02/25/2022 0314   NA 143 04/10/2018 1157   K 3.5 02/25/2022 0314   CL 104 02/25/2022 0314   CO2 24 02/25/2022 0314   BUN 9 02/25/2022 0314   BUN 8 04/10/2018 1157   CREATININE 0.72 02/25/2022 0314   CREATININE 0.85 02/04/2022 0828   CREATININE 0.78 05/02/2017 0000   CREATININE 0.71 10/26/2012 0903      Component Value Date/Time   CALCIUM 7.6 (L) 02/25/2022 0314   ALKPHOS 51 02/24/2022 0414   AST 22 02/24/2022 0414   AST 18 02/04/2022 0828   ALT 18 02/24/2022 0414   ALT 15 02/04/2022 0828   BILITOT 0.8 02/24/2022 0414   BILITOT 0.5 02/04/2022 0828       RADIOGRAPHIC STUDIES:  ECHOCARDIOGRAM COMPLETE  Result Date: 02/24/2022    ECHOCARDIOGRAM REPORT   Patient Name:   AASHISH HAMM Date of Exam: 02/24/2022 Medical Rec #:   [id number]          Height:       70.0 in Accession #:    [id number]         Weight:       172.0 lb Date of Birth:  Aug 19, 1950  BSA:          1.958 m Patient Age:    71 years           BP:           00/00 mmHg Patient Gender: M                  HR:           95 bpm. Exam Location:  Inpatient Procedure: 2D Echo, Cardiac Doppler and Color Doppler Indications:    nonischemic cardiomyopathy  History:        Patient has prior history of Echocardiogram examinations, most                 recent 01/02/2016. CAD; Risk Factors:Diabetes and Hypertension.  Sonographer:    Jefferey Pica Referring Phys: [place]  1. Left ventricular ejection fraction, by estimation, is 55 to 60%. The left ventricle has normal function. The left ventricle has no regional wall motion abnormalities. There is mild concentric left ventricular hypertrophy. Left ventricular diastolic parameters are consistent with Grade I diastolic dysfunction (impaired relaxation).  2. Right ventricular systolic function is normal. The right ventricular size is normal. There is normal pulmonary artery systolic pressure.  3. The mitral valve is normal in structure. No evidence of mitral valve regurgitation. No evidence of mitral stenosis.  4. The aortic valve is tricuspid. Aortic valve regurgitation is not visualized. Aortic valve sclerosis is present, with no evidence of aortic valve stenosis.  5. The inferior vena cava is normal in size with greater than 50% respiratory variability, suggesting right atrial pressure of 3 mmHg. Comparison(s): No prior Echocardiogram. FINDINGS  Left Ventricle: Left ventricular ejection fraction, by estimation, is 55 to 60%. The left ventricle has normal function. The left ventricle has no regional wall motion abnormalities. The left ventricular internal cavity size was normal in size. There is  mild concentric left ventricular hypertrophy. Left ventricular diastolic parameters are consistent with Grade I  diastolic dysfunction (impaired relaxation). Right Ventricle: The right ventricular size is normal. Right ventricular systolic function is normal. There is normal pulmonary artery systolic pressure. The tricuspid regurgitant velocity is 2.50 m/s, and with an assumed right atrial pressure of 10 mmHg, the estimated right ventricular systolic pressure is 63.1 mmHg. Left Atrium: Left atrial size was normal in size. Right Atrium: Right atrial size was normal in size. Pericardium: Trivial pericardial effusion is present. Mitral Valve: The mitral valve is normal in structure. No evidence of mitral valve regurgitation. No evidence of mitral valve stenosis. Tricuspid Valve: The tricuspid valve is normal in structure. Tricuspid valve regurgitation is mild . No evidence of tricuspid stenosis. Aortic Valve: The aortic valve is tricuspid. Aortic valve regurgitation is not visualized. Aortic valve sclerosis is present, with no evidence of aortic valve stenosis. Aortic valve peak gradient measures 6.1 mmHg. Pulmonic Valve: The pulmonic valve was normal in structure. Pulmonic valve regurgitation is trivial. No evidence of pulmonic stenosis. Aorta: The aortic root is normal in size and structure. Venous: The inferior vena cava is normal in size with greater than 50% respiratory variability, suggesting right atrial pressure of 3 mmHg. IAS/Shunts: No atrial level shunt detected by color flow Doppler.  LEFT VENTRICLE PLAX 2D LVIDd:         4.00 cm Diastology LVIDs:         2.60 cm LV e' medial:    13.70 cm/s LV PW:         1.30  cm LV E/e' medial:  4.2 LV IVS:        1.30 cm LV e' lateral:   10.28 cm/s                        LV E/e' lateral: 5.5  RIGHT VENTRICLE            IVC RV Basal diam:  3.70 cm    IVC diam: 1.40 cm RV Mid diam:    3.40 cm RV S prime:     8.18 cm/s TAPSE (M-mode): 1.3 cm LEFT ATRIUM             Index        RIGHT ATRIUM           Index LA diam:        3.60 cm 1.84 cm/m   RA Area:     18.80 cm LA Vol (A2C):   53.6  ml 27.38 ml/m  RA Volume:   56.30 ml  28.76 ml/m LA Vol (A4C):   45.2 ml 23.09 ml/m LA Biplane Vol: 50.6 ml 25.85 ml/m  AORTIC VALVE AV Vmax:      123.50 cm/s AV Peak Grad: 6.1 mmHg LVOT Vmax:    109.00 cm/s LVOT Vmean:   56.300 cm/s LVOT VTI:     0.158 m  AORTA Ao Root diam: 3.70 cm Ao Asc diam:  3.50 cm MITRAL VALVE                TRICUSPID VALVE MV Area (PHT): 6.90 cm     TR Peak grad:   25.0 mmHg MV Decel Time: 110 msec     TR Vmax:        250.00 cm/s MV E velocity: 57.00 cm/s MV A velocity: 107.00 cm/s  SHUNTS MV E/A ratio:  0.53         Systemic VTI: 0.16 m Kirk Ruths MD Electronically signed by Kirk Ruths MD Signature Date/Time: 02/24/2022/10:55:52 AM    Final    DG ESOPHAGUS W SINGLE CM (SOL OR THIN BA)  Result Date: 02/23/2022 CLINICAL DATA:  Patient with history of lung cancer with prior chemoradiation; now with solid dysphagia/globus sensation mid chest region EXAM: ESOPHAGUS/BARIUM SWALLOW TECHNIQUE: Single contrast examination was performed using thin liquid barium. This exam was performed by Nash Mantis- and was supervised and interpreted by Dr. Misty Stanley FLUOROSCOPY: Radiation Exposure Index (as provided by the fluoroscopic device): 54.20 mGy Kerma COMPARISON:  None Available. FINDINGS: AP, lateral, and oblique imaging of the esophagus was obtained while the patient was swallowing barium contrast material. Study is limited by delayed oral transit and piecemeal swallowing, but there is no evidence for obstructing esophageal lesion. No gross mucosal irregularity. No evidence for diverticulum or gross mucosal ulcer. No substantial hiatal hernia. Although no esophageal stricture is discernible, piecemeal swallowing and patient reluctance to swallow a 13 mm barium tablet limit assessment. Ingested 73m barium tablet: Not given- patient declined Other: None. IMPRESSION: Limited study without gross esophageal abnormality. No evidence for obstructing mass lesion. While no stricture is  evident, sensitivity is markedly decreased by piecemeal swallowing and patient declining a 13 mm barium tablet. Speech pathology consultation may prove helpful. Electronically Signed   By: EMisty StanleyM.D.   On: 02/23/2022 12:07   CT CHEST ABDOMEN PELVIS W CONTRAST  Result Date: 02/22/2022 CLINICAL DATA:  Metastatic lung cancer. Restaging. * Tracking Code: BO * EXAM: CT CHEST, ABDOMEN, AND PELVIS WITH CONTRAST TECHNIQUE: Multidetector  CT imaging of the chest, abdomen and pelvis was performed following the standard protocol during bolus administration of intravenous contrast. RADIATION DOSE REDUCTION: This exam was performed according to the departmental dose-optimization program which includes automated exposure control, adjustment of the mA and/or kV according to patient size and/or use of iterative reconstruction technique. CONTRAST:  118m OMNIPAQUE IOHEXOL 300 MG/ML  SOLN COMPARISON:  Chest CTA 02/12/2022.  PET-CT 12/13/2021. FINDINGS: CT CHEST FINDINGS Cardiovascular: No acute vascular findings are evident. Diffuse atherosclerosis of the aorta, great vessels and coronary arteries status post median sternotomy and CABG. The heart size is normal. There is no pericardial effusion. Mediastinum/Nodes: Stable 8 mm right paratracheal node on image 21/2 which was not significantly hypermetabolic on PET-CT. Additional small mediastinal lymph nodes are stable. No axillary adenopathy. The thyroid gland, trachea and esophagus demonstrate no significant findings. Lungs/Pleura: Cavitation of the right lower lobe mass is again noted with a central air-fluid level. This appears similar to the recent chest CTA, measuring approximately 6.3 x 4.2 cm on image 36/2. The margins of this mass are partly obscured by surrounding right lower lobe airspace disease. Surrounding areas of nodular peribronchovascular thickening and ground-glass opacity have increased compared with the recent CT, especially in the right middle lobe.  Scattered ill-defined nodular densities in the left lung are similar. Underlying mild centrilobular and paraseptal emphysema. Musculoskeletal/Chest wall: No chest wall mass or suspicious osseous findings. Healed median sternotomy. CT ABDOMEN AND PELVIS FINDINGS Hepatobiliary: The liver is normal in density without suspicious focal abnormality. Small gallstones are noted. No evidence of gallbladder wall thickening or biliary dilatation. Pancreas: Unremarkable. No pancreatic ductal dilatation or surrounding inflammatory changes. Spleen: The spleen measures 14.7 x 7.4 x 12.6 cm (volume = 720 cm^3), consistent with mild splenomegaly, unchanged. No focal abnormality identified. Adrenals/Urinary Tract: Both adrenal glands appear normal. The kidneys appear normal without evidence of urinary tract calculus, suspicious lesion or hydronephrosis. The bladder appears normal for its degree of distention. Stomach/Bowel: No enteric contrast administered. The stomach appears unremarkable for its degree of distension. No evidence of bowel wall thickening, distention or surrounding inflammatory change. The appendix appears normal. Vascular/Lymphatic: There are no enlarged abdominal or pelvic lymph nodes. Diffuse aortic and branch vessel atherosclerosis without evidence of aneurysm or large vessel occlusion. There is high-grade luminal narrowing of the left common iliac artery. Reproductive: Stable central calcifications within the prostate gland. Other: Prominent fat in both inguinal canals. No ascites or peritoneal nodularity. Possible postsurgical changes at the umbilicus without recurrent hernia. Musculoskeletal: No acute or significant osseous findings. Mild spondylosis, greatest at L4-5 where there is a grade 1 degenerative anterolisthesis. IMPRESSION: 1. Cavitation of previously demonstrated hypermetabolic right lower lobe mass, consistent with response to treatment. Appearance is similar to chest CTA of 10 days prior. 2.  Increasing surrounding peribronchovascular nodularity and ground-glass opacities, especially in the right middle and lower lobes which may be treatment related or secondary to postobstructive pneumonitis. Attention on follow-up recommended. 3. No evidence of metastatic disease within the abdomen or pelvis. 4. Stable mild splenomegaly and cholelithiasis. 5. Coronary and aortic atherosclerosis (ICD10-I70.0). Emphysema (ICD10-J43.9). High-grade luminal narrowing of the left common iliac artery. Electronically Signed   By: WRichardean SaleM.D.   On: 02/22/2022 10:23   DG Chest Portable 1 View  Result Date: 02/22/2022 CLINICAL DATA:  Shortness of breath for a few days. History of lung cancer. EXAM: PORTABLE CHEST 1 VIEW COMPARISON:  Chest CT 02/12/2022. Radiographs 02/12/2022 and 02/04/2022. FINDINGS: 0732 hours. The heart size  and mediastinal contours are stable status post median sternotomy and CABG. Cavitary right lower lobe mass is partially obscured by increasing right basilar airspace disease. There may be a small amount of pleural fluid on the right. No evidence of pneumothorax. The bones appear unchanged. Telemetry leads overlie the chest. IMPRESSION: Increased right basilar airspace disease, partially obscuring the known cavitary mass. Findings may be secondary to radiation pneumonitis or pneumonia. Electronically Signed   By: Richardean Sale M.D.   On: 02/22/2022 08:09   CT Angio Chest PE W and/or Wo Contrast  Result Date: 02/12/2022 CLINICAL DATA:  71 year old male history of shortness of breath and cough for 2 weeks. Suspected pulmonary embolism. History of lung cancer status post chemotherapy and radiation therapy (radiation therapy completed 1 week ago). * Tracking Code: BO * EXAM: CT ANGIOGRAPHY CHEST WITH CONTRAST TECHNIQUE: Multidetector CT imaging of the chest was performed using the standard protocol during bolus administration of intravenous contrast. Multiplanar CT image reconstructions and  MIPs were obtained to evaluate the vascular anatomy. RADIATION DOSE REDUCTION: This exam was performed according to the departmental dose-optimization program which includes automated exposure control, adjustment of the mA and/or kV according to patient size and/or use of iterative reconstruction technique. CONTRAST:  141m OMNIPAQUE IOHEXOL 350 MG/ML SOLN COMPARISON:  Chest CT 11/09/2021. FINDINGS: Cardiovascular: There are no filling defects within the pulmonary arterial tree to suggest pulmonary embolism. Heart size is normal. There is no significant pericardial fluid, thickening or pericardial calcification. There is aortic atherosclerosis, as well as atherosclerosis of the great vessels of the mediastinum and the coronary arteries, including calcified atherosclerotic plaque in the left main, left anterior descending, left circumflex and right coronary arteries. Status post median sternotomy for CABG including LIMA to the LAD. Mediastinum/Nodes: No pathologically enlarged mediastinal or hilar lymph nodes. Esophagus is unremarkable in appearance. No axillary lymphadenopathy. Lungs/Pleura: Previously noted right lower lobe mass is now centrally cavitary with thickened irregular walls estimated to measure approximately 7.3 x 6.1 x 6.5 cm on today's examination (axial image 79 of series 4 and coronal image 100 of series 7). Surrounding areas of ground-glass attenuation, septal thickening, thickening of the peribronchovascular interstitium and regional architectural distortion are noted on today's study, likely to reflect a combination of chronic post obstructive changes and evolving postradiation changes. No pleural effusions. Upper Abdomen: Aortic atherosclerosis. Musculoskeletal: Median sternotomy wires. There are no aggressive appearing lytic or blastic lesions noted in the visualized portions of the skeleton. Review of the MIP images confirms the above findings. IMPRESSION: 1. No evidence of pulmonary embolism.  2. Interval cavitation of previously noted large right lower lobe mass with what appears to be evolving postobstructive and postradiation changes in the surrounding lung parenchyma, as above. 3. Aortic atherosclerosis, in addition to left main and three-vessel coronary artery disease. Status post median sternotomy for CABG including LIMA to the LAD. Aortic Atherosclerosis (ICD10-I70.0). Electronically Signed   By: DVinnie LangtonM.D.   On: 02/12/2022 05:46   DG Chest Port 1 View  Result Date: 02/12/2022 CLINICAL DATA:  Shortness of breath, cough EXAM: PORTABLE CHEST 1 VIEW COMPARISON:  02/04/2022 FINDINGS: Improving cavitary lesion in the central right lower lobe, corresponding to the patient's known primary bronchogenic neoplasm. Left lung is clear.  No pleural effusion or pneumothorax. The heart is normal in size. Postsurgical changes related to prior CABG. Median sternotomy. IMPRESSION: Improving cavitary lesion in the central right lower lobe, corresponding to the patient's known primary bronchogenic neoplasm. No evidence of acute cardiopulmonary disease.  Electronically Signed   By: Julian Hy M.D.   On: 02/12/2022 03:11   DG Chest 2 View  Result Date: 02/05/2022 CLINICAL DATA:  History of lung cancer.  Cough. EXAM: CHEST - 2 VIEW COMPARISON:  Chest x-ray 01/14/2022. FINDINGS: Mediastinum and hilar structures normal. Prior CABG. Heart size stable. Large cavitary lesion in the mid posterior right lung is again noted. This lesion has decreased in size from a maximum diameter of 11 cm to a maximum diameter of 7.5 cm. Again this could be a neoplastic or infectious process. No new focal abnormalities identified. No pleural effusion or pneumothorax. Prior cervical spine fusion. Degenerative change lumbar spine. IMPRESSION: Large cavitary lesion in the mid posterior right lung is again noted. This lesion has decreased in size from a maximum diameter of 11 cm on 01/14/2022 to maximum diameter of 7.5 cm  on today's exam. No new focal abnormalities identified. Electronically Signed   By: Marcello Moores  Register M.D.   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  He presented with a large right lower lobe lung mass in addition to satellite nodules close to the mass and suspicious mediastinal lymphadenopathy.  He was diagnosed in May 2023.   His PD-L1 expression is 1%.  The patient completed a course of concurrent chemoradiation with weekly carboplatin for an AUC of 2 and paclitaxel 45 mg per metered square.  He is status post 6 cycles.  The patient has been having fatigue and radiation-induced esophagitis.  The patient's last day radiation was on 02/08/2022.  The patient recently had a restaging CT scan performed.  Dr. Julien Nordmann personally independently reviewed the scan discussed results with the patient today.  The scan showed a positive response to treatment.  It is a decrease in size of the right lower lobe lung mass.  Dr. Julien Nordmann had a lengthy discussion with the patient today about his current condition and recommended next steps in treatment.  Dr. Julien Nordmann recommended consolidation immunotherapy with Imfinzi 15 mg IV every 4 weeks.  Patient is interested in this option and he is expected to start his first dose of treatment on 02/2019/2023.  I discussed with her the adverse effect of the immunotherapy including but not limited to immunotherapy mediated skin rash, diarrhea, inflammation of the lung, kidney, liver, thyroid or other endocrine dysfunction  Will see him back for follow-up visit in 5 weeks for evaluation and repeat blood work before starting cycle #2.  Cough?  The patient was advised to call immediately if he has any concerning symptoms in the interval. The patient voices understanding of current disease status and treatment options and is in  agreement with the current care plan. All questions were answered. The patient knows to call the clinic with any problems, questions or concerns. We can certainly see the patient much sooner if necessary         No orders of the defined types were placed in this encounter.    I spent {CHL ONC TIME VISIT - WTUUE:[id number]} counseling the patient face to face. The total time spent in the appointment was {CHL ONC TIME VISIT - XTAVW:[id number]}.  Charnelle Bergeman L Seirra Kos, PA-C 03/01/22

## 2022-03-01 NOTE — Telephone Encounter (Signed)
[name], PA-C advised pt no longer needs this scan since he had one while in the hospital on 02/26/22. LVM for pts wife advising of this.

## 2022-03-01 NOTE — Telephone Encounter (Signed)
Pt was seen yesterday. Coming for stat CBC this morning.

## 2022-03-04 ENCOUNTER — Telehealth: Payer: Self-pay

## 2022-03-04 ENCOUNTER — Ambulatory Visit (HOSPITAL_COMMUNITY): Payer: Medicare HMO

## 2022-03-04 NOTE — Chronic Care Management (AMB) (Signed)
Chronic Care Management Pharmacy Assistant   Name: Jorge Adams  MRN: [id number] DOB: 08-21-1950   Reason for Encounter: Reminder Call   Conditions to be addressed/monitored: CAD, HTN, HLD, and DMII  Recent office visits:  02/28/22-Jorge Letvak,MD(fam med)- f/u cough and rash, stop augmentin,stop mucinex-Will change to clindamycin to continue anaerobic coverage-Will give steroid burst 15m x 5 then 239mx 5 02/26/22-Letvak,MD(fam med)- Rash,Recommended he continue the last 2 days of augmentin-possible reaction to blood transfusion   Recent consult visits:  None since last CCM contact  Hospital visits:  Medication Reconciliation was completed by comparing discharge summary, patient's EMR and Pharmacy list, and upon discussion with patient.  Admitted to the hospital on 02/22/22 due pneumonia . Discharge date was 02/25/22. [facility] -started amoxicillin  Guaifenesin  Duoneb  Medication Changes at Hospital Discharge: -Changed metoprolol tartrate  Medications Discontinued at Hospital Discharge: -Stopped amlodipine/benazepril  Medrol dospak  Pottassium chloride   Medications that remain the same after Hospital Discharge:??  -All other medications will remain the same.    Medications: Outpatient Encounter Medications as of 03/04/2022  Medication Sig Note   albuterol (VENTOLIN HFA) 108 (90 Base) MCG/ACT inhaler Inhale 1-2 puffs into the lungs every 6 (six) hours as needed (for cough.  okay to fill with albuterol/proair/ventolin.). (Patient taking differently: Inhale 2 puffs into the lungs 3 (three) times daily as needed for shortness of breath.) 02/22/2022: Pt stated he still has this medication at home and is using it PRN.    aspirin EC 81 MG tablet Take 1 tablet (81 mg total) by mouth daily. Okay to restart this medication on 12/05/2021.    benzonatate (TESSALON) 200 MG capsule Take 1 capsule (200  mg total) by mouth 3 (three) times daily as needed.    blood glucose meter kit and supplies Dispense based on pt and insurance preference. Use up to 3 times daily as directed. (Dx. E11.9).    Blood Pressure Monitoring (SPHYGMOMANOMETER) MISC Use daily to check BP.  Dx I10    brimonidine (ALPHAGAN P) 0.1 % SOLN Place 2 drops into both eyes 2 (two) times daily.    cilostazol (PLETAL) 100 MG tablet Take 1 tablet (100 mg total) by mouth 2 (two) times daily. Okay to restart this medication on 12/05/2021.    clindamycin (CLEOCIN) 300 MG capsule Take 1 capsule (300 mg total) by mouth 3 (three) times daily.    Cyanocobalamin (VITAMIN B12 PO) Take 1 tablet by mouth in the morning and at bedtime.    dorzolamide-timolol (COSOPT) 22.3-6.8 MG/ML ophthalmic solution Place 1 drop into both eyes 2 (two) times daily.    fenofibrate (TRICOR) 145 MG tablet TAKE 1 TABLET BY MOUTH EVERY DAY IN THE EVENING (Patient taking differently: Take 145 mg by mouth every evening.)    guaiFENesin (MUCINEX) 600 MG 12 hr tablet Take 1 tablet (600 mg total) by mouth 2 (two) times daily. (Patient not taking: Reported on 02/28/2022)    HYDROcodone bit-homatropine (HYCODAN) 5-1.5 MG/5ML syrup Take 5 mLs by mouth every 8 (eight) hours as needed for cough (sedation caution.). (Patient taking differently: Take 5 mLs by mouth 3 (three) times daily as needed for cough (sedation caution.).)    ipratropium-albuterol (DUONEB) 0.5-2.5 (3) MG/3ML SOLN Take 3 mLs by nebulization every 6 (six) hours as needed.    metFORMIN (GLUCOPHAGE) 500 MG tablet Take 1 tablet (500 mg total) by mouth 2 (two) times daily with a  meal.    metoprolol tartrate (LOPRESSOR) 25 MG tablet Take 0.5 tablets (12.5 mg total) by mouth 2 (two) times daily. Hold for SBP less than 110    Netarsudil-Latanoprost (ROCKLATAN) 0.02-0.005 % SOLN Apply 1 drop to eye at bedtime. In both eyes    nitroGLYCERIN (NITROSTAT) 0.4 MG SL tablet Place 1 tablet (0.4 mg total) under the tongue every 5  (five) minutes as needed for chest pain.    omega-3 acid ethyl esters (LOVAZA) 1 g capsule TAKE 1 CAPSULE BY MOUTH TWICE A DAY (Patient taking differently: Take 1 g by mouth 2 (two) times daily.)    predniSONE (DELTASONE) 20 MG tablet Take 2 tablets (40 mg total) by mouth daily. For 5 days then 1 tab daily for 5 days    prochlorperazine (COMPAZINE) 10 MG tablet Take 1 tablet (10 mg total) by mouth every 6 (six) hours as needed for nausea or vomiting. (Patient taking differently: Take 10 mg by mouth 2 (two) times daily as needed for nausea or vomiting.)    rosuvastatin (CRESTOR) 20 MG tablet TAKE 1 TABLET BY MOUTH EVERY DAY (Patient taking differently: Take 20 mg by mouth daily.)    sucralfate (CARAFATE) 1 g tablet Take 1 tablet (1 g total) by mouth 4 (four) times daily -  with meals and at bedtime. 5 min before meals for radiation induced esophagitis (Patient taking differently: Take 1 g by mouth See admin instructions. 2-3)    No facility-administered encounter medications on file as of 03/04/2022.   Jorge Adams was contacted to remind of upcoming telephone visit with Jorge Adams on 03/07/22 at 9:30am. Patient was reminded to have any blood glucose and blood pressure readings available for review at appointment.   Patient confirmed appointment.  Are you having any problems with your medications? No   Do you have any concerns you like to discuss with the pharmacist? No The patient did ask me to get a message to Jorge Adams , if he was back in the office to reach out to the patient when he was available . Message sent.  CCM referral has been placed prior to visit?  No   Star Rating Drugs: Medication:  Last Fill: Day Supply Metformin 521m  12/30/21 90 Rosuvastatin 270m 02/12/22 90 Amlodipine/benazepril 5-20 01/12/22 [id number]  [phone]

## 2022-03-05 ENCOUNTER — Encounter: Payer: Self-pay | Admitting: Family Medicine

## 2022-03-06 ENCOUNTER — Encounter: Payer: Self-pay | Admitting: Physician Assistant

## 2022-03-06 ENCOUNTER — Telehealth: Payer: Self-pay | Admitting: Family Medicine

## 2022-03-06 ENCOUNTER — Other Ambulatory Visit: Payer: Self-pay

## 2022-03-06 ENCOUNTER — Inpatient Hospital Stay: Payer: Medicare HMO | Attending: Internal Medicine | Admitting: Physician Assistant

## 2022-03-06 VITALS — BP 143/91 | HR 106 | Temp 97.5°F | Resp 17 | Wt 184.3 lb

## 2022-03-06 DIAGNOSIS — R000 Tachycardia, unspecified: Secondary | ICD-10-CM | POA: Diagnosis not present

## 2022-03-06 DIAGNOSIS — R21 Rash and other nonspecific skin eruption: Secondary | ICD-10-CM | POA: Diagnosis not present

## 2022-03-06 DIAGNOSIS — K208 Other esophagitis without bleeding: Secondary | ICD-10-CM | POA: Diagnosis not present

## 2022-03-06 DIAGNOSIS — I252 Old myocardial infarction: Secondary | ICD-10-CM | POA: Diagnosis not present

## 2022-03-06 DIAGNOSIS — D509 Iron deficiency anemia, unspecified: Secondary | ICD-10-CM | POA: Diagnosis not present

## 2022-03-06 DIAGNOSIS — R059 Cough, unspecified: Secondary | ICD-10-CM | POA: Insufficient documentation

## 2022-03-06 DIAGNOSIS — C3491 Malignant neoplasm of unspecified part of right bronchus or lung: Secondary | ICD-10-CM | POA: Diagnosis not present

## 2022-03-06 DIAGNOSIS — I776 Arteritis, unspecified: Secondary | ICD-10-CM

## 2022-03-06 DIAGNOSIS — C3431 Malignant neoplasm of lower lobe, right bronchus or lung: Secondary | ICD-10-CM | POA: Diagnosis not present

## 2022-03-06 DIAGNOSIS — Z7952 Long term (current) use of systemic steroids: Secondary | ICD-10-CM | POA: Insufficient documentation

## 2022-03-06 DIAGNOSIS — I959 Hypotension, unspecified: Secondary | ICD-10-CM | POA: Diagnosis not present

## 2022-03-06 DIAGNOSIS — D649 Anemia, unspecified: Secondary | ICD-10-CM | POA: Diagnosis not present

## 2022-03-06 MED ORDER — PREDNISONE 20 MG PO TABS: ORAL_TABLET | ORAL | 0 refills | Status: DC

## 2022-03-06 MED ORDER — INTEGRA PLUS PO CAPS: 1.0000 | ORAL_CAPSULE | Freq: Every morning | ORAL | 2 refills | Status: DC

## 2022-03-06 NOTE — Telephone Encounter (Signed)
Late entry, called pt and wife on 03/04/22.  Discussed rationale for recent abx use, ddx of rash, and not giving IV iron in his current situation.  Discussed inpatient and outpatient events.    His rash is some better and his breathing is improved from inpatient status.  Some sputum, no fevers.  Rash isn't itchy.  I think it makes sense to finish prednisone and clindamycin and then f/u with hematology on 03/06/22.  I routed this note in the meantime, with appreciation.  Pt and wife agreed.

## 2022-03-07 ENCOUNTER — Telehealth: Payer: Self-pay

## 2022-03-07 ENCOUNTER — Telehealth: Payer: Self-pay | Admitting: Dietician

## 2022-03-07 ENCOUNTER — Ambulatory Visit: Payer: Medicare HMO | Admitting: Pharmacist

## 2022-03-07 DIAGNOSIS — I739 Peripheral vascular disease, unspecified: Secondary | ICD-10-CM

## 2022-03-07 DIAGNOSIS — E785 Hyperlipidemia, unspecified: Secondary | ICD-10-CM

## 2022-03-07 DIAGNOSIS — E1159 Type 2 diabetes mellitus with other circulatory complications: Secondary | ICD-10-CM

## 2022-03-07 NOTE — Progress Notes (Signed)
Chronic Care Management Pharmacy Note  03/07/2022 Name:  Jorge Adams MRN:  [id number] DOB:  27-Apr-1951  Summary: CCM F/U visit -Reviewed medications; pt affirms compliance with maintenance medications -Pt following closely with oncology for [facility] of lung  Recommendations/Changes made from today's visit: -No med changes  Plan: -Transition CCM to Self Care: The patient has been provided with contact information for the care management team and has been advised to call with any health related questions or concerns.       Subjective: Jorge Adams is an 71 y.o. year old male who is a primary patient of Damita Dunnings, Elveria Rising, MD.  The CCM team was consulted for assistance with disease management and care coordination needs.    Engaged with patient by telephone for follow up visit in response to provider referral for pharmacy case management and/or care coordination services.   Consent to Services:  The patient was given information about Chronic Care Management services, agreed to services, and gave verbal consent prior to initiation of services.  Please see initial visit note for detailed documentation.   Patient Care Team: [name], MD as PCP - General (Family Medicine) Adrian Prows, MD as Consulting Physician (Cardiology) [place], [place], [place] as Referring Physician (Optometry) Warden Fillers, MD as Consulting Physician (Ophthalmology) Charlton Haws, [facility] as Pharmacist (Pharmacist)  Recent office visits: 02/28/22-Richard Letvak,MD(fam med)- f/u cough and rash, stop augmentin,stop mucinex-Will change to clindamycin to continue anaerobic coverage-Will give steroid burst 71m x 5 then 244mx 5 02/26/22-Letvak,MD(fam med)- Rash,Recommended he continue the last 2 days of augmentin-possible reaction to blood transfusion   Recent consult visits: 03/06/22 PA Heilingoetter (Oncology): f/u [facility] - LE rash concerning for vasculitis. Rx'd iron supplement (integra plus) and  prednisone taper. Take 2 weeks off immunotherapy, may resume in 2 weeks if condition improves.  02/04/22 PA Heilingoetter (Oncology): [facility] Lung cancer Dx 11/2021. Radiation tx complete 02/08/22.  Hospital visits: Medication Reconciliation was completed by comparing discharge summary, patient's EMR and Pharmacy list, and upon discussion with patient.   Admitted to the hospital on 02/22/22 due pneumonia . Discharge date was 02/25/22. [facility] -started amoxicillin             Guaifenesin             Duoneb   Medication Changes at Hospital Discharge: -Changed metoprolol tartrate   Medications Discontinued at Hospital Discharge: -Stopped amlodipine/benazepril             Medrol dospak             Pottassium chloride    Medications that remain the same after Hospital Discharge:??  -All other medications will remain the same.     Objective:  Lab Results  Component Value Date   CREATININE 0.72 02/25/2022   BUN 9 02/25/2022   GFR 85.99 10/29/2021   GFRNONAA >60 02/25/2022   GFRAA >60 05/08/2018   NA 133 (L) 02/25/2022   K 3.5 02/25/2022   CALCIUM 7.6 (L) 02/25/2022   CO2 24 02/25/2022   GLUCOSE 127 (H) 02/25/2022    Lab Results  Component Value Date/Time   HGBA1C 6.6 (H) 02/22/2022 08:10 AM   HGBA1C 6.4 10/29/2021 10:16 AM   GFR 85.99 10/29/2021 10:16 AM   GFR 91.33 09/11/2020 07:44 AM   MICROALBUR >300.0 05/27/2013 08:35 AM   MICROALBUR 6.8 05/29/2012 08:24 AM    Last diabetic Eye exam:  Lab Results  Component Value Date/Time   HMDIABEYEEXA No Retinopathy 03/12/2021 12:00 AM    Last diabetic Foot exam: No results found for: "HMDIABFOOTEX"   Lab Results  Component Value Date   CHOL 129 10/29/2021   HDL 54.70 10/29/2021   LDLCALC 58 10/29/2021   LDLDIRECT 70.0 11/29/2015   TRIG 80.0 10/29/2021   CHOLHDL 2 10/29/2021       Latest Ref Rng & Units 02/24/2022    4:14 AM 02/23/2022   12:33 AM  02/22/2022    8:10 AM  Hepatic Function  Total Protein 6.5 - 8.1 g/dL 5.0  5.0  5.9   Albumin 3.5 - 5.0 g/dL 1.7  1.9  2.0   AST 15 - 41 U/L 22  19  27    ALT 0 - 44 U/L 18  17  22    Alk Phosphatase 38 - 126 U/L 51  49  65   Total Bilirubin 0.3 - 1.2 mg/dL 0.8  0.8  0.9     Lab Results  Component Value Date/Time   TSH 2.68 03/13/2018 08:59 AM       Latest Ref Rng & Units 03/01/2022   10:42 AM 02/24/2022    4:14 AM 02/23/2022    9:33 AM  CBC  WBC 4.0 - 10.5 K/uL 4.0  4.1    Hemoglobin 13.0 - 17.0 g/dL 8.4 Repeated and verified X2.  7.4  7.3   Hematocrit 39.0 - 52.0 % 24.2 Repeated and verified X2.  25.0  22.4   Platelets 150.0 - 400.0 K/uL 146.0  118      Lab Results  Component Value Date/Time   VD25OH 47.52 03/13/2018 08:59 AM   VD25OH 30 01/28/2011 10:43 AM    Clinical ASCVD: Yes  The ASCVD Risk score (Arnett DK, et al., 2019) failed to calculate for the following reasons:   The patient has a prior MI or stroke diagnosis       01/31/2022    2:04 PM 01/30/2021   10:43 AM 09/09/2019    8:50 AM  Depression screen PHQ 2/9  Decreased Interest 0 0 0  Down, Depressed, Hopeless 0 0 0  PHQ - 2 Score 0 0 0  Altered sleeping 0 0 0  Tired, decreased energy 0 0 0  Change in appetite 0 0 0  Feeling bad or failure about yourself  0 0 0  Trouble concentrating 0 0 0  Moving slowly or fidgety/restless 0 0 0  Suicidal thoughts 0 0 0  PHQ-9 Score 0 0 0  Difficult doing work/chores Not difficult at all Not difficult at all Not difficult at all      Social History   Tobacco Use  Smoking Status Former   Packs/day: 2.50   Years: 35.00   Total pack years: 87.50   Types: Cigarettes   Quit date: 12/02/2002   Years since quitting: 19.2  Smokeless Tobacco Never   BP Readings from Last 3 Encounters:  03/06/22 (!) 143/91  02/28/22 108/62  02/26/22 98/60   Pulse Readings from Last 3 Encounters:  03/06/22 (!) 106  02/28/22 86  02/26/22 78   Wt Readings from Last 3 Encounters:   03/06/22 184 lb 4.8 oz (83.6 kg)  02/28/22 182 lb 6 oz (82.7 kg)  02/26/22 184 lb (83.5 kg)   BMI Readings from Last 3 Encounters:  03/06/22 26.44 kg/m  02/28/22 26.17 kg/m  02/26/22 26.40 kg/m    Assessment/Interventions: Review of patient past medical history, allergies, medications, health status, including review of consultants  reports, laboratory and other test data, was performed as part of comprehensive evaluation and provision of chronic care management services.   SDOH:  (Social Determinants of Health) assessments and interventions performed: No - done 01/2022  SDOH Screenings   Alcohol Screen: Low Risk  (01/31/2022)   Alcohol Screen    Last Alcohol Screening Score (AUDIT): 0  Depression (PHQ2-9): Low Risk  (01/31/2022)   Depression (PHQ2-9)    PHQ-2 Score: 0  Financial Resource Strain: Low Risk  (01/31/2022)   Overall Financial Resource Strain (CARDIA)    Difficulty of Paying Living Expenses: Not hard at all  Food Insecurity: No Food Insecurity (01/31/2022)   Hunger Vital Sign    Worried About Running Out of Food in the Last Year: Never true    Ran Out of Food in the Last Year: Never true  Housing: Low Risk  (01/31/2022)   Housing    Last Housing Risk Score: 0  Physical Activity: Unknown (01/31/2022)   Exercise Vital Sign    Days of Exercise per Week: Patient refused    Minutes of Exercise per Session: Patient refused  Social Connections: Moderately Integrated (01/31/2022)   Social Connection and Isolation Panel [NHANES]    Frequency of Communication with Friends and Family: More than three times a week    Frequency of Social Gatherings with Friends and Family: Never    Attends Religious Services: More than 4 times per year    Active Member of Clubs or Organizations: No    Attends Archivist Meetings: Never    Marital Status: Married  Stress: Stress Concern Present (01/31/2022)   [employer] [place]     Feeling of Stress : To some extent  Tobacco Use: Medium Risk (03/06/2022)   Patient History    Smoking Tobacco Use: Former    Smokeless Tobacco Use: Never    Passive Exposure: Not on file  Transportation Needs: No Transportation Needs (01/31/2022)   PRAPARE - Hydrologist (Medical): No    Lack of Transportation (Non-Medical): No    CCM Care Plan  No Known Allergies  Medications Reviewed Today     Reviewed by Charlton Haws, [facility] (Pharmacist) on 03/07/22 at 1542  Med List Status: <None>   Medication Order Taking? Sig Documenting Provider Last Dose Status Informant  albuterol (VENTOLIN HFA) 108 (90 Base) MCG/ACT inhaler [id number] Yes Inhale 1-2 puffs into the lungs every 6 (six) hours as needed (for cough.  okay to fill with albuterol/proair/ventolin.).  Patient taking differently: Inhale 2 puffs into the lungs 3 (three) times daily as needed for shortness of breath.   [name], MD Taking Active Self, Pharmacy Records           Med Note (CRUTHIS, CHLOE C   Fri Feb 22, 2022 12:05 PM) Pt stated he still has this medication at home and is using it PRN.   aspirin EC 81 MG tablet [id number] Yes Take 1 tablet (81 mg total) by mouth daily. Okay to restart this medication on 12/05/2021. Collene Gobble, MD Taking Active Self, Pharmacy Records  benzonatate (TESSALON) 200 MG capsule [id number] Yes Take 1 capsule (200 mg total) by mouth 3 (three) times daily as needed. [name], MD Taking Active Self, Pharmacy Records  blood glucose meter kit and supplies [id number] Yes Dispense based on pt and insurance preference. Use up to 3 times daily as directed. (Dx. E11.9). [name], MD Taking Active  Self, Pharmacy Records  Blood Pressure Monitoring [facility]) MISC [id number] Yes Use daily to check BP.  Dx I10 [name], MD Taking Active Self, Pharmacy Records  brimonidine (ALPHAGAN P) 0.1 % SOLN [id number] Yes Place 2 drops into both eyes 2  (two) times daily. [provider] Taking Active Self, Pharmacy Records  cilostazol (PLETAL) 100 MG tablet [id number] Yes Take 1 tablet (100 mg total) by mouth 2 (two) times daily. Okay to restart this medication on 12/05/2021. Collene Gobble, MD Taking Active Self, Pharmacy Records  clindamycin (CLEOCIN) 300 MG capsule [id number] Yes Take 1 capsule (300 mg total) by mouth 3 (three) times daily. Venia Carbon, MD Taking Active   Cyanocobalamin (VITAMIN B12 PO) [id number] Yes Take 1 tablet by mouth in the morning and at bedtime. [provider] Taking Active Self, Pharmacy Records  dorzolamide-timolol (COSOPT) 22.3-6.8 MG/ML ophthalmic solution [id number] Yes Place 1 drop into both eyes 2 (two) times daily. [provider] Taking Active Self, Pharmacy Records  FeFum-FePoly-FA-B Cmp-C-Biot (INTEGRA PLUS) CAPS [id number]  Take 1 capsule by mouth every morning. Heilingoetter, [name] L, PA-C  Active   fenofibrate (TRICOR) 145 MG tablet [id number] Yes TAKE 1 TABLET BY MOUTH EVERY DAY IN THE Noah Charon, MD Taking Active Self, Pharmacy Records  guaiFENesin (MUCINEX) 600 MG 12 hr tablet [id number] Yes Take 1 tablet (600 mg total) by mouth 2 (two) times daily. Oswald Hillock, MD Taking Active   HYDROcodone bit-homatropine [facility]) 5-1.5 MG/5ML syrup [id number] Yes Take 5 mLs by mouth every 8 (eight) hours as needed for cough (sedation caution.).  Patient taking differently: Take 5 mLs by mouth 3 (three) times daily as needed for cough (sedation caution.).   Curt Bears, MD Taking Active Self, Pharmacy Records  ipratropium-albuterol (DUONEB) 0.5-2.5 (3) MG/3ML SOLN [id number] Yes Take 3 mLs by nebulization every 6 (six) hours as needed. Oswald Hillock, MD Taking Active   metFORMIN (GLUCOPHAGE) 500 MG tablet [id number] Yes Take 1 tablet (500 mg total) by mouth 2 (two) times daily with a meal. Byrum, Rose Fillers, MD Taking Active Self, Pharmacy Records  metoprolol tartrate  (LOPRESSOR) 25 MG tablet [id number] Yes Take 0.5 tablets (12.5 mg total) by mouth 2 (two) times daily. Hold for SBP less than 110 [place], Marge Duncans, MD Taking Active   Netarsudil-Latanoprost (ROCKLATAN) 0.02-0.005 % SOLN [id number] Yes Apply 1 drop to eye at bedtime. In both eyes [provider] Taking Active Self, Pharmacy Records  nitroGLYCERIN (NITROSTAT) 0.4 MG SL tablet [id number] Yes Place 1 tablet (0.4 mg total) under the tongue every 5 (five) minutes as needed for chest pain. [name], MD Taking Active Self, Pharmacy Records  omega-3 acid ethyl esters (LOVAZA) 1 g capsule [id number] Yes TAKE 1 CAPSULE BY MOUTH TWICE A Bonnee Quin, MD Taking Active Self, Pharmacy Records  predniSONE (DELTASONE) 20 MG tablet [id number] Yes Take 40 mg (2 tablets) by mouth in the morning for 1 week, followed by 20 mg (1 tablet) by mouth in the morning for 1 week. Heilingoetter, [name] L, PA-C Taking Active   prochlorperazine (COMPAZINE) 10 MG tablet [id number] Yes Take 1 tablet (10 mg total) by mouth every 6 (six) hours as needed for nausea or vomiting.  Patient taking differently: Take 10 mg by mouth 2 (two) times daily as needed for nausea or vomiting.   Heilingoetter, [name] L, PA-C Taking Active Self, Pharmacy Records  rosuvastatin (CRESTOR) 20 MG tablet [id number] Yes TAKE 1 TABLET BY MOUTH EVERY DAY Adrian Prows,  MD Taking Active Self, Pharmacy Records  sucralfate (CARAFATE) 1 g tablet [id number] Yes Take 1 tablet (1 g total) by mouth 4 (four) times daily -  with meals and at bedtime. 5 min before meals for radiation induced esophagitis  Patient taking differently: Take 1 g by mouth See admin instructions. 2-3   Tyler Pita, MD Taking Active Self, Pharmacy Records            Patient Active Problem List   Diagnosis Date Noted   Serum sickness due to drug 02/28/2022   Rash 02/26/2022   Pressure injury of skin 02/23/2022   Lobar pneumonia ([place]) 02/22/2022   Sepsis ([place]) 02/22/2022    Dysphagia 02/22/2022   Hyponatremia 02/22/2022   Normocytic anemia 02/22/2022   Stage III squamous cell carcinoma of right lung ([place]) 12/07/2021   Encounter for antineoplastic chemotherapy 12/07/2021   Mass of lower lobe of right lung 11/09/2021   Cough 10/29/2021   Acute bronchitis 10/29/2021   Stenosis of cervical spine with myelopathy ([place]) 05/07/2018   Paresthesia 03/15/2018   Health care maintenance 09/04/2017   Snoring 58/03/9832   Umbilical hernia 82/50/5397   CAD (coronary artery disease) 01/02/2016   Angina pectoris ([place]) 12/18/2015   Routine general medical examination at a health care facility 12/07/2014   Advance care planning 12/07/2014   PAD (peripheral artery disease) ([place]) 12/05/2013   Erectile dysfunction associated with type 2 diabetes mellitus ([place]) 10/31/2013   Type 2 diabetes mellitus with vascular disease ([place]) 10/31/2013   Senile nuclear sclerosis 06/04/2013   Borderline glaucoma with ocular hypertension 06/04/2013   Amblyopia, unspecified 06/04/2013   ASHD (arteriosclerotic heart disease) 01/28/2011   Hypogonadism, male 01/28/2011   Hypertension associated with diabetes ([place]) 01/28/2011   Hyperlipidemia LDL goal <70 01/28/2011   Obesity (BMI 30-39.9) 01/28/2011    Immunization History  Administered Date(s) Administered   Fluad Quad(high Dose 65+) 05/20/2019, 05/11/2020, 04/30/2021   Influenza Split 05/01/2012, 04/21/2013   Influenza,inj,Quad PF,6+ Mos 06/06/2015, 06/24/2016, 04/25/2017, 06/09/2018   Influenza-Unspecified 04/27/2014   PFIZER Comirnaty(Gray Top)Covid-19 Tri-Sucrose Vaccine 10/31/2020   PFIZER(Purple Top)SARS-COV-2 Vaccination 08/29/2019, 09/21/2019, 05/15/2020, 05/01/2021   Pneumococcal Conjugate-13 12/02/2014   Pneumococcal Polysaccharide-23 09/10/2010, 12/04/2015   Tdap 09/10/2010   Zoster Recombinat (Shingrix) 03/18/2019, 07/05/2019   Zoster, Live 09/10/2010    Conditions to be addressed/monitored:  Hypertension, Hyperlipidemia,  Diabetes, and Coronary Artery Disease  Care Plan : [place]  Updates made by Charlton Haws, [place] since 03/07/2022 12:00 AM     Problem: Hypertension, Hyperlipidemia, Diabetes, and Coronary Artery Disease   Priority: High     Long-Range Goal: Disease mgmt   Start Date: 03/07/2022  Expected End Date: 03/08/2023  This Visit's Progress: On track  Priority: High  Note:   Current Barriers:  None identified  Pharmacist Clinical Goal(s):  Patient will contact provider office for questions/concerns as evidenced notation of same in electronic health record through collaboration with PharmD and provider.   Interventions: 1:1 collaboration with [name], MD regarding development and update of comprehensive plan of care as evidenced by provider attestation and co-signature Inter-disciplinary care team collaboration (see longitudinal plan of care) Comprehensive medication review performed; medication list updated in electronic medical record  Hypertension (BP goal <130/80) -Controlled - clinic and home readings within goal; pt does not take metoprolol is SBP is < 110 -Denies hypotensive/hypertensive symptoms -Current treatment: Metoprolol tartrate 25 mg -1/2 tab BID - Appropriate, Effective, Safe, Accessible -Medications previously tried: amlodipine/benazepril -Educated on BP goals and  benefits of medications for prevention of heart attack, stroke and kidney damage; -Counseled to monitor BP at home daily -Recommended to continue current medication  Diabetes (A1c goal <7%) -Controlled - A1c 6.6% (02/2022) at goal -Current home glucose readings: n/a -Denies hypoglycemic/hyperglycemic symptoms -Current medications: Metformin 500 mg - 2 tab BID - Appropriate, Effective, Safe, Accessible -Medications previously tried: [place]  -Current exercise: stays active -Educated on A1c and blood sugar goals; Counseled to check feet daily and get yearly eye exams - foot and  eye exam are due  -Recommended to continue current medication  Hyperlipidemia / ASCVD (LDL goal < 70) -Controlled - LDL 58 (10/2021) at goal; TRIG 80 at goal (previously > 250 in ~2016) -Hx CAD, PAD -Current treatment: Rosuvastatin 20 mg daily -Appropriate, Effective, Safe, Accessible Fenofibrate 145 mg daily - Appropriate, Effective, Safe, Accessible Lovaza 1 g BID -Appropriate, Effective, Safe, Accessible Aspirin 81 mg daily - Appropriate, Effective, Safe, Accessible Nitroglycerin 0.4 mg SL prn -Appropriate, Effective, Safe, Accessible Cilostazol 100 mg BID -Appropriate, Effective, Safe, Accessible -Medications previously tried: n/a  -Educated on Cholesterol goals; Benefits of statin for ASCVD risk reduction; -Recommended to continue current medication  Lung cancer -Follows with [facility] -Dx May 2023; s/p chemoradiation (ended 02/08/22); holding immunotherapy until condition improves  Patient Goals/Self-Care Activities Patient will:  - take medications as prescribed as evidenced by patient report and record review focus on medication adherence by routine check blood pressure daily, document, and provide at future appointments      Medication Assistance: None required.  Patient affirms current coverage meets needs.  Compliance/Adherence/Medication fill history: Care Gaps: None  Star-Rating Drugs: Metformin - PDC 100% Rosuvastatin - PDC 90%  Medication Access: Within the past 30 days, how often has patient missed a dose of medication? 0 Is a pillbox or other method used to improve adherence? Yes  Factors that may affect medication adherence? no barriers identified Are meds synced by current pharmacy? No  Are meds delivered by current pharmacy? No  Does patient experience delays in picking up medications due to transportation concerns? No   [facility] Services Reviewed: Is patient disadvantaged to use [facility]?: No  Current Rx insurance plan: [employer] [place] Name  and location of Current pharmacy:  [id number] [place] [address] [place]Phone: [phone]: [phone] [facility] services reviewed with patient today?: No  Patient requests to transfer care to [facility]?: No  Reason patient declined to change pharmacies: Not mentioned at this visit   Care Plan and Follow Up Patient Decision:  Patient agrees to Care Plan and Follow-up.  Plan: The patient has been provided with contact information for the care management team and has been advised to call with any health related questions or concerns.   LCharlene Brooke PharmD, BCACP Clinical Pharmacist [phone]

## 2022-03-07 NOTE — Telephone Encounter (Signed)
Patient screened on MST. Second attempt to reach. Patient picked up the telephone but said he was not in a position to talk freely.  Agreed to reschedule telephone consult to 03/14/22.    April Manson, RDN, LDN Registered Dietitian, [place] Part Time Remote (Usual office hours: Tuesday-Thursday) Cell: [phone]

## 2022-03-07 NOTE — Telephone Encounter (Signed)
Pts wife, Juliann Pulse, [place] stating the pt was supposed to be prescribed iron at yesterdays visit.  I reviewed pts chart and and there was a rx for Integra Plus sent to pts confirmed pharmacy on file. The pharmacy confirmed receipt of this at 3:49pm. I have [facility] who confirmed they do in fact have the rx for the Integra, they just needed to order the medication because they did not have it in stock.  I have advised pts wife of this.

## 2022-03-07 NOTE — Patient Instructions (Addendum)
Visit Information  Phone number for Pharmacist: [phone]   Goals Addressed   None     Care Plan : Bernie  Updates made by Charlton Haws, RPH since 03/07/2022 12:00 AM     Problem: Hypertension, Hyperlipidemia, Diabetes, and Coronary Artery Disease   Priority: High     Long-Range Goal: Disease mgmt   Start Date: 03/07/2022  Expected End Date: 03/08/2023  This Visit's Progress: On track  Priority: High  Note:   Current Barriers:  None identified  Pharmacist Clinical Goal(s):  Patient will contact provider office for questions/concerns as evidenced notation of same in electronic health record through collaboration with PharmD and provider.   Interventions: 1:1 collaboration with [name], MD regarding development and update of comprehensive plan of care as evidenced by provider attestation and co-signature Inter-disciplinary care team collaboration (see longitudinal plan of care) Comprehensive medication review performed; medication list updated in electronic medical record  Hypertension (BP goal <130/80) -Controlled - clinic and home readings within goal; pt does not take metoprolol is SBP is < 110 -Denies hypotensive/hypertensive symptoms -Current treatment: Metoprolol tartrate 25 mg -1/2 tab BID - Appropriate, Effective, Safe, Accessible -Medications previously tried: amlodipine/benazepril -Educated on BP goals and benefits of medications for prevention of heart attack, stroke and kidney damage; -Counseled to monitor BP at home daily -Recommended to continue current medication  Diabetes (A1c goal <7%) -Controlled - A1c 6.6% (02/2022) at goal -Current home glucose readings: n/a -Denies hypoglycemic/hyperglycemic symptoms -Current medications: Metformin 500 mg - 2 tab BID - Appropriate, Effective, Safe, Accessible -Medications previously tried: [place]  -Current exercise: stays active -Educated on A1c and blood sugar goals; Counseled  to check feet daily and get yearly eye exams - foot and eye exam are due  -Recommended to continue current medication  Hyperlipidemia / ASCVD (LDL goal < 70) -Controlled - LDL 58 (10/2021) at goal; TRIG 80 at goal (previously > 250 in ~2016) -Hx CAD, PAD -Current treatment: Rosuvastatin 20 mg daily -Appropriate, Effective, Safe, Accessible Fenofibrate 145 mg daily - Appropriate, Effective, Safe, Accessible Lovaza 1 g BID -Appropriate, Effective, Safe, Accessible Aspirin 81 mg daily - Appropriate, Effective, Safe, Accessible Nitroglycerin 0.4 mg SL prn -Appropriate, Effective, Safe, Accessible Cilostazol 100 mg BID -Appropriate, Effective, Safe, Accessible -Medications previously tried: n/a  -Educated on Cholesterol goals; Benefits of statin for ASCVD risk reduction; -Recommended to continue current medication  Lung cancer -Follows with [facility] -Dx May 2023; s/p chemoradiation (ended 02/08/22); holding immunotherapy until condition improves  Patient Goals/Self-Care Activities Patient will:  - take medications as prescribed as evidenced by patient report and record review focus on medication adherence by routine check blood pressure daily, document, and provide at future appointments      Patient verbalizes understanding of instructions and care plan provided today and agrees to view in [place]. Active MyChart status and patient understanding of how to access instructions and care plan via MyChart confirmed with patient.    The patient has been provided with contact information for the care management team and has been advised to call with any health related questions or concerns.    Charlene Brooke, PharmD, BCACP Clinical Pharmacist [facility] [phone]

## 2022-03-11 ENCOUNTER — Other Ambulatory Visit: Payer: Self-pay | Admitting: Internal Medicine

## 2022-03-11 ENCOUNTER — Telehealth: Payer: Self-pay

## 2022-03-11 DIAGNOSIS — C3491 Malignant neoplasm of unspecified part of right bronchus or lung: Secondary | ICD-10-CM

## 2022-03-11 DIAGNOSIS — R059 Cough, unspecified: Secondary | ICD-10-CM

## 2022-03-11 MED ORDER — HYDROCODONE BIT-HOMATROP MBR 5-1.5 MG/5ML PO SOLN: 5.0000 mL | Freq: Three times a day (TID) | ORAL | 0 refills | Status: DC | PRN

## 2022-03-11 NOTE — Telephone Encounter (Signed)
Pt request refill of Hycodan.

## 2022-03-12 DIAGNOSIS — E11319 Type 2 diabetes mellitus with unspecified diabetic retinopathy without macular edema: Secondary | ICD-10-CM | POA: Diagnosis not present

## 2022-03-12 LAB — HM DIABETES EYE EXAM

## 2022-03-12 NOTE — Progress Notes (Signed)
[facility] OFFICE PROGRESS NOTE  [name], MD [place] [address]  DIAGNOSIS:  Stage IIIb (T4, N0/N2, M0) non-small cell lung cancer, squamous cell carcinoma presented with large right lower lobe lung mass in addition to satellite nodules close to the mass and suspicious mediastinal lymphadenopathy diagnosed in May 2023   PD-L1 expression 1%  PRIOR THERAPY:   A course of concurrent chemoradiation with weekly carboplatin for AUC of 2 and paclitaxel 45 Mg/M2.  Last dose 02/04/22.  Status post 6 cycles.  CURRENT THERAPY: Consolidation immunotherapy with Imfinzi 1500 mg IV every 4 weeks starting next week on 03/27/2022.  INTERVAL HISTORY: SHANNA STRENGTH 71 y.o. male returns to the clinic today for a follow-up visit accompanied by his wife.  The patient was last seen in the clinic on 03/06/2022.  The patient completed a course of concurrent chemoradiation.  Unfortunately, the patient had a lot of health concerns following treatment with possible pneumonia, petechial/ecchymosis rash on her his lower extremities, decreased oral intake, and weakness.  When he was seen on 03/06/2022, Dr. Julien Nordmann recommended extending his course of prednisone.  The patient completed 20 mg tablets of prednisone yesterday.   Dr. Julien Nordmann recommended reevaluating the patient in 2 weeks to see if he has improvement in his overall condition.  If he has improvement in his condition, then we may consider starting him on consolidation immunotherapy with Imfinzi for 200 mg IV every 4 weeks.  Since last being seen, the patient is feeling fair.  He does continue to have weakness although it is improving.  He is using leg pedals at home.  He is also staying awake majority of the day.  His rash is improving on his lower extremities.  He still has some bilateral lower extremity pitting edema which improves in the morning after elevating his legs and worsens throughout the course of the day.   The patient gained 8 pounds since last being seen.  The patient is eating certain foods a little bit better such as vegetables but he still has a challenging time with meat.  He is drinking body armor, Gatorade, and 3-4 routine shakes a day.  He is urinating okay.  He still has similar shortness of breath and cough if he lays down at night.  However, the phlegm was previously dark and is now clear.  Denies any hemoptysis or chest pain.  Denies any nausea, vomiting, diarrhea, or constipation. Denies any headache or visual changes. The patient is here today for evaluation and consideration of discussing his next steps in his treatment.   MEDICAL HISTORY: Past Medical History:  Diagnosis Date   Arthritis    ASHD (arteriosclerotic heart disease)    Atherosclerosis of native arteries of the extremities with intermittent claudication    Cataract    Coronary atherosclerosis of native coronary artery    Diabetes mellitus    NIDDM   Dyslipidemia    ED (erectile dysfunction)    Full dentures    Glaucoma associated with ocular disorder, mild stage    bilateral   Hyperlipidemia    Hypertension    under control, has been on med. > 9 yr.   Impotence of organic origin    Meniscus tear 05/2012   right knee   Myocardial infarction [facility]) 10/2002   Stented coronary artery     ALLERGIES:  has No Known Allergies.  MEDICATIONS:  Current Outpatient Medications  Medication Sig Dispense Refill   albuterol (VENTOLIN  HFA) 108 (90 Base) MCG/ACT inhaler Inhale 1-2 puffs into the lungs every 6 (six) hours as needed (for cough.  okay to fill with albuterol/proair/ventolin.). (Patient taking differently: Inhale 2 puffs into the lungs 3 (three) times daily as needed for shortness of breath.) 8 g 2   aspirin EC 81 MG tablet Take 1 tablet (81 mg total) by mouth daily. Okay to restart this medication on 12/05/2021. 30 tablet 11   benzonatate (TESSALON) 200 MG capsule Take 1 capsule (200 mg total) by mouth 3 (three)  times daily as needed. 30 capsule 3   blood glucose meter kit and supplies Dispense based on pt and insurance preference. Use up to 3 times daily as directed. (Dx. E11.9). 1 each 12   Blood Pressure Monitoring (SPHYGMOMANOMETER) MISC Use daily to check BP.  Dx I10 1 each 0   brimonidine (ALPHAGAN P) 0.1 % SOLN Place 2 drops into both eyes 2 (two) times daily.     cilostazol (PLETAL) 100 MG tablet Take 1 tablet (100 mg total) by mouth 2 (two) times daily. Okay to restart this medication on 12/05/2021. 180 tablet 1   clindamycin (CLEOCIN) 300 MG capsule Take 1 capsule (300 mg total) by mouth 3 (three) times daily. 30 capsule 0   Cyanocobalamin (VITAMIN B12 PO) Take 1 tablet by mouth in the morning and at bedtime.     dorzolamide-timolol (COSOPT) 22.3-6.8 MG/ML ophthalmic solution Place 1 drop into both eyes 2 (two) times daily.     FeFum-FePoly-FA-B Cmp-C-Biot (INTEGRA PLUS) CAPS Take 1 capsule by mouth every morning. 30 capsule 2   fenofibrate (TRICOR) 145 MG tablet TAKE 1 TABLET BY MOUTH EVERY DAY IN THE EVENING 90 tablet 1   guaiFENesin (MUCINEX) 600 MG 12 hr tablet Take 1 tablet (600 mg total) by mouth 2 (two) times daily. 14 tablet 0   HYDROcodone bit-homatropine (HYCODAN) 5-1.5 MG/5ML syrup Take 5 mLs by mouth every 8 (eight) hours as needed for cough (sedation caution.). 120 mL 0   ipratropium-albuterol (DUONEB) 0.5-2.5 (3) MG/3ML SOLN Take 3 mLs by nebulization every 6 (six) hours as needed. 360 mL 1   metFORMIN (GLUCOPHAGE) 500 MG tablet Take 1 tablet (500 mg total) by mouth 2 (two) times daily with a meal.     metoprolol tartrate (LOPRESSOR) 25 MG tablet Take 0.5 tablets (12.5 mg total) by mouth 2 (two) times daily. Hold for SBP less than 110 90 tablet 4   Netarsudil-Latanoprost (ROCKLATAN) 0.02-0.005 % SOLN Apply 1 drop to eye at bedtime. In both eyes     nitroGLYCERIN (NITROSTAT) 0.4 MG SL tablet Place 1 tablet (0.4 mg total) under the tongue every 5 (five) minutes as needed for chest pain.  25 tablet 5   omega-3 acid ethyl esters (LOVAZA) 1 g capsule TAKE 1 CAPSULE BY MOUTH TWICE A DAY 180 capsule 1   predniSONE (DELTASONE) 10 MG tablet Take 1 tablet (10 mg total) by mouth daily with breakfast. 7 tablet 0   prochlorperazine (COMPAZINE) 10 MG tablet Take 1 tablet (10 mg total) by mouth every 6 (six) hours as needed for nausea or vomiting. (Patient taking differently: Take 10 mg by mouth 2 (two) times daily as needed for nausea or vomiting.) 30 tablet 2   rosuvastatin (CRESTOR) 20 MG tablet TAKE 1 TABLET BY MOUTH EVERY DAY 90 tablet 0   sucralfate (CARAFATE) 1 g tablet Take 1 tablet (1 g total) by mouth 4 (four) times daily -  with meals and at bedtime. 5 min before  meals for radiation induced esophagitis (Patient not taking: Reported on 03/19/2022) 120 tablet 2   No current facility-administered medications for this visit.    SURGICAL HISTORY:  Past Surgical History:  Procedure Laterality Date   ANTERIOR CERVICAL DECOMP/DISCECTOMY FUSION N/A 05/07/2018   Procedure: ANTERIOR CERVICAL DECOMPRESSION/DISCECTOMY FUSION CERVICAL FIVE - CERVICAL SIX;  Surgeon: Consuella Lose, MD;  Location: [facility];  Service: Neurosurgery;  Laterality: N/A;  ANTERIOR CERVICAL DECOMPRESSION/DISCECTOMY FUSION CERVICAL FIVE - CERVICAL SIX   BRONCHIAL BIOPSY  12/04/2021   Procedure: BRONCHIAL BIOPSIES;  Surgeon: Collene Gobble, MD;  Location: [facility];  Service: Cardiopulmonary;;   BRONCHIAL BRUSHINGS  12/04/2021   Procedure: BRONCHIAL BRUSHINGS;  Surgeon: Collene Gobble, MD;  Location: [facility];  Service: Cardiopulmonary;;   BRONCHIAL NEEDLE ASPIRATION BIOPSY  12/04/2021   Procedure: BRONCHIAL NEEDLE ASPIRATION BIOPSIES;  Surgeon: Collene Gobble, MD;  Location: [facility];  Service: Cardiopulmonary;;   CARDIAC CATHETERIZATION  10/23/2002   had 1 stent placed then-- by Dr. Melvern Banker   CARDIAC CATHETERIZATION N/A 12/21/2015   Procedure: Left Heart Cath and Coronary Angiography;  Surgeon: Adrian Prows,  MD;  Location: [facility] CV LAB;  Service: Cardiovascular;  Laterality: N/A;   CATARACT EXTRACTION Right 05/2017   CATARACT EXTRACTION Left 07/2017   COLONOSCOPY  10/08/2010   Dr.Patterson   CORONARY ARTERY BYPASS GRAFT N/A 01/02/2016   Procedure: CORONARY ARTERY BYPASS GRAFTING (CABG) TIMES 3 USING LEFT INTERNAL MAMMARY ARTERY AND RIGHT SAPHENOUS LEG VEIN HARVESTED ENDOSCOPICALLY;  Surgeon: Ivin Poot, MD;  Location: [facility];  Service: Open Heart Surgery;  Laterality: N/A;   EYE SURGERY     INSERTION OF MESH N/A 05/06/2016   Procedure: INSERTION OF MESH;  Surgeon: Georganna Skeans, MD;  Location: [facility];  Service: General;  Laterality: N/A;   KNEE ARTHROSCOPY WITH MEDIAL MENISECTOMY  06/04/2012   Procedure: KNEE ARTHROSCOPY WITH MEDIAL MENISECTOMY;  Surgeon: Ninetta Lights, MD;  Location: [facility];  Service: Orthopedics;  Laterality: Right;  RIGHT SCOPE MEDIAL MENISCECTOMY, CHONDROPLASTY, EXCISION LOOSE BODY   LOWER EXTREMITY ANGIOGRAM N/A 11/23/2013   Procedure: LOWER EXTREMITY ANGIOGRAM;  Surgeon: Laverda Page, MD;  Location: [facility] CATH LAB;  Service: Cardiovascular;  Laterality: N/A;   NECK SURGERY N/A    02/19/2018   PERIPHERAL VASCULAR CATHETERIZATION N/A 12/21/2015   Procedure: Abdominal Aortogram;  Surgeon: Adrian Prows, MD;  Location: [facility];  Service: Cardiovascular;  Laterality: N/A;   PERIPHERAL VASCULAR CATHETERIZATION N/A 12/21/2015   Procedure: Abdominal Aortogram w/Lower Extremity;  Surgeon: Adrian Prows, MD;  Location: [facility] CV LAB;  Service: Cardiovascular;  Laterality: N/A;   TEE WITHOUT CARDIOVERSION N/A 01/02/2016   Procedure: TRANSESOPHAGEAL ECHOCARDIOGRAM (TEE);  Surgeon: Ivin Poot, MD;  Location: [facility];  Service: Open Heart Surgery;  Laterality: N/A;   UMBILICAL HERNIA REPAIR N/A 05/06/2016   Procedure: HERNIA REPAIR UMBILICAL ADULT;  Surgeon: Georganna Skeans, MD;  Location: [facility];  Service: General;  Laterality: N/A;   VIDEO  BRONCHOSCOPY N/A 12/04/2021   Procedure: VIDEO BRONCHOSCOPY WITH FLUORO;  Surgeon: Collene Gobble, MD;  Location: [facility];  Service: Cardiopulmonary;  Laterality: N/A;    REVIEW OF SYSTEMS:   Review of Systems  Constitutional: Stable to slightly improving fatigue and generalized weakness.  Appetite is improving but not at his baseline.  Negative for chills, fever and unexpected weight change.  HENT: Improving dysphagia.  Negative for mouth sores, nosebleeds, and sore throat. Eyes: Negative for eye problems and icterus.  Respiratory: Stable shortness of breath. Positive for cough with clear phlegm. Negative for hemoptysis, and wheezing.   Cardiovascular: Positive for bilateral lower extremity pitting edema.  Negative for chest pain. Gastrointestinal: Negative for abdominal pain, constipation, diarrhea, nausea and vomiting.  Genitourinary: Negative for bladder incontinence, difficulty urinating, dysuria, frequency and hematuria.   Musculoskeletal: Negative for back pain, gait problem, neck pain and neck stiffness.  Skin: Improving rash on lower extremities. Neurological: Negative for dizziness, extremity weakness, gait problem, headaches, light-headedness and seizures.  Hematological: Negative for adenopathy. Does not bruise/bleed easily.  Psychiatric/Behavioral: Negative for confusion, depression and sleep disturbance. The patient is not nervous/anxious.     PHYSICAL EXAMINATION:  Blood pressure (!) 140/93, pulse 82, temperature 97.7 F (36.5 C), temperature source Temporal, resp. rate 17, height 5' 10"  (1.778 m), weight 192 lb 9.6 oz (87.4 kg), SpO2 95 %.  ECOG PERFORMANCE STATUS: 2  Physical Exam  Constitutional: Oriented to person, place, and time and well-developed, well-nourished, and in no distress. No distress.  HENT:  Head: Normocephalic and atraumatic.  Mouth/Throat: Oropharynx is clear and moist. No oropharyngeal exudate.  Eyes: Conjunctivae are normal. Right eye exhibits  no discharge. Left eye exhibits no discharge. No scleral icterus.  Neck: Normal range of motion. Neck supple.  Cardiovascular: Normal rate, regular rhythm, normal heart sounds and intact distal pulses.   Pulmonary/Chest: Effort normal.  Quiet breath sounds in the right lung.  No respiratory distress. No wheezes. No rales.  Abdominal: Soft. Bowel sounds are normal. Exhibits no distension and no mass. There is no tenderness.  Musculoskeletal: Normal range of motion. Pitting edema in the lower extremities. Lymphadenopathy:    No cervical adenopathy.  Neurological: Alert and oriented to person, place, and time. Exhibits normal muscle tone. Gait normal. Coordination normal.  Skin: Skin is warm and dry.  Improving rash on lower extremities.  Not diaphoretic. No erythema. No pallor.  Psychiatric: Mood, memory and judgment normal.  Vitals reviewed.  LABORATORY DATA: Lab Results  Component Value Date   WBC 3.9 (L) 03/19/2022   HGB 9.0 (L) 03/19/2022   HCT 28.0 (L) 03/19/2022   MCV 88.1 03/19/2022   PLT 141 (L) 03/19/2022      Chemistry      Component Value Date/Time   NA 139 03/19/2022 0800   NA 143 04/10/2018 1157   K 3.9 03/19/2022 0800   CL 109 03/19/2022 0800   CO2 26 03/19/2022 0800   BUN 34 (H) 03/19/2022 0800   BUN 8 04/10/2018 1157   CREATININE 2.05 (H) 03/19/2022 0800   CREATININE 0.78 05/02/2017 0000   CREATININE 0.71 10/26/2012 0903      Component Value Date/Time   CALCIUM 7.8 (L) 03/19/2022 0800   ALKPHOS 45 03/19/2022 0800   AST 23 03/19/2022 0800   ALT 12 03/19/2022 0800   BILITOT 0.4 03/19/2022 0800       RADIOGRAPHIC STUDIES:  ECHOCARDIOGRAM COMPLETE  Result Date: 02/24/2022    ECHOCARDIOGRAM REPORT   Patient Name:   ARAS ALBARRAN Date of Exam: 02/24/2022 Medical Rec #:  [id number]          Height:       70.0 in Accession #:    [id number]         Weight:       172.0 lb Date of Birth:  [birth year]         BSA:          1.958 m Patient Age:    40 years  BP:           00/00 mmHg Patient Gender: M                  HR:           95 bpm. Exam Location:  Inpatient Procedure: 2D Echo, Cardiac Doppler and Color Doppler Indications:    nonischemic cardiomyopathy  History:        Patient has prior history of Echocardiogram examinations, most                 recent 01/02/2016. CAD; Risk Factors:Diabetes and Hypertension.  Sonographer:    Jefferey Pica Referring Phys: [place]  1. Left ventricular ejection fraction, by estimation, is 55 to 60%. The left ventricle has normal function. The left ventricle has no regional wall motion abnormalities. There is mild concentric left ventricular hypertrophy. Left ventricular diastolic parameters are consistent with Grade I diastolic dysfunction (impaired relaxation).  2. Right ventricular systolic function is normal. The right ventricular size is normal. There is normal pulmonary artery systolic pressure.  3. The mitral valve is normal in structure. No evidence of mitral valve regurgitation. No evidence of mitral stenosis.  4. The aortic valve is tricuspid. Aortic valve regurgitation is not visualized. Aortic valve sclerosis is present, with no evidence of aortic valve stenosis.  5. The inferior vena cava is normal in size with greater than 50% respiratory variability, suggesting right atrial pressure of 3 mmHg. Comparison(s): No prior Echocardiogram. FINDINGS  Left Ventricle: Left ventricular ejection fraction, by estimation, is 55 to 60%. The left ventricle has normal function. The left ventricle has no regional wall motion abnormalities. The left ventricular internal cavity size was normal in size. There is  mild concentric left ventricular hypertrophy. Left ventricular diastolic parameters are consistent with Grade I diastolic dysfunction (impaired relaxation). Right Ventricle: The right ventricular size is normal. Right ventricular systolic function is normal. There is normal pulmonary artery systolic  pressure. The tricuspid regurgitant velocity is 2.50 m/s, and with an assumed right atrial pressure of 10 mmHg, the estimated right ventricular systolic pressure is 75.6 mmHg. Left Atrium: Left atrial size was normal in size. Right Atrium: Right atrial size was normal in size. Pericardium: Trivial pericardial effusion is present. Mitral Valve: The mitral valve is normal in structure. No evidence of mitral valve regurgitation. No evidence of mitral valve stenosis. Tricuspid Valve: The tricuspid valve is normal in structure. Tricuspid valve regurgitation is mild . No evidence of tricuspid stenosis. Aortic Valve: The aortic valve is tricuspid. Aortic valve regurgitation is not visualized. Aortic valve sclerosis is present, with no evidence of aortic valve stenosis. Aortic valve peak gradient measures 6.1 mmHg. Pulmonic Valve: The pulmonic valve was normal in structure. Pulmonic valve regurgitation is trivial. No evidence of pulmonic stenosis. Aorta: The aortic root is normal in size and structure. Venous: The inferior vena cava is normal in size with greater than 50% respiratory variability, suggesting right atrial pressure of 3 mmHg. IAS/Shunts: No atrial level shunt detected by color flow Doppler.  LEFT VENTRICLE PLAX 2D LVIDd:         4.00 cm Diastology LVIDs:         2.60 cm LV e' medial:    13.70 cm/s LV PW:         1.30 cm LV E/e' medial:  4.2 LV IVS:        1.30 cm LV e' lateral:   10.28 cm/s  LV E/e' lateral: 5.5  RIGHT VENTRICLE            IVC RV Basal diam:  3.70 cm    IVC diam: 1.40 cm RV Mid diam:    3.40 cm RV S prime:     8.18 cm/s TAPSE (M-mode): 1.3 cm LEFT ATRIUM             Index        RIGHT ATRIUM           Index LA diam:        3.60 cm 1.84 cm/m   RA Area:     18.80 cm LA Vol (A2C):   53.6 ml 27.38 ml/m  RA Volume:   56.30 ml  28.76 ml/m LA Vol (A4C):   45.2 ml 23.09 ml/m LA Biplane Vol: 50.6 ml 25.85 ml/m  AORTIC VALVE AV Vmax:      123.50 cm/s AV Peak Grad: 6.1 mmHg  LVOT Vmax:    109.00 cm/s LVOT Vmean:   56.300 cm/s LVOT VTI:     0.158 m  AORTA Ao Root diam: 3.70 cm Ao Asc diam:  3.50 cm MITRAL VALVE                TRICUSPID VALVE MV Area (PHT): 6.90 cm     TR Peak grad:   25.0 mmHg MV Decel Time: 110 msec     TR Vmax:        250.00 cm/s MV E velocity: 57.00 cm/s MV A velocity: 107.00 cm/s  SHUNTS MV E/A ratio:  0.53         Systemic VTI: 0.16 m Kirk Ruths MD Electronically signed by Kirk Ruths MD Signature Date/Time: 02/24/2022/10:55:52 AM    Final    DG ESOPHAGUS W SINGLE CM (SOL OR THIN BA)  Result Date: 02/23/2022 CLINICAL DATA:  Patient with history of lung cancer with prior chemoradiation; now with solid dysphagia/globus sensation mid chest region EXAM: ESOPHAGUS/BARIUM SWALLOW TECHNIQUE: Single contrast examination was performed using thin liquid barium. This exam was performed by Nash Mantis- and was supervised and interpreted by Dr. Misty Stanley FLUOROSCOPY: Radiation Exposure Index (as provided by the fluoroscopic device): 54.20 mGy Kerma COMPARISON:  None Available. FINDINGS: AP, lateral, and oblique imaging of the esophagus was obtained while the patient was swallowing barium contrast material. Study is limited by delayed oral transit and piecemeal swallowing, but there is no evidence for obstructing esophageal lesion. No gross mucosal irregularity. No evidence for diverticulum or gross mucosal ulcer. No substantial hiatal hernia. Although no esophageal stricture is discernible, piecemeal swallowing and patient reluctance to swallow a 13 mm barium tablet limit assessment. Ingested 23m barium tablet: Not given- patient declined Other: None. IMPRESSION: Limited study without gross esophageal abnormality. No evidence for obstructing mass lesion. While no stricture is evident, sensitivity is markedly decreased by piecemeal swallowing and patient declining a 13 mm barium tablet. Speech pathology consultation may prove helpful. Electronically Signed   By:  EMisty StanleyM.D.   On: 02/23/2022 12:07   CT CHEST ABDOMEN PELVIS W CONTRAST  Result Date: 02/22/2022 CLINICAL DATA:  Metastatic lung cancer. Restaging. * Tracking Code: BO * EXAM: CT CHEST, ABDOMEN, AND PELVIS WITH CONTRAST TECHNIQUE: Multidetector CT imaging of the chest, abdomen and pelvis was performed following the standard protocol during bolus administration of intravenous contrast. RADIATION DOSE REDUCTION: This exam was performed according to the departmental dose-optimization program which includes automated exposure control, adjustment of the mA and/or kV according to patient  size and/or use of iterative reconstruction technique. CONTRAST:  169m OMNIPAQUE IOHEXOL 300 MG/ML  SOLN COMPARISON:  Chest CTA 02/12/2022.  PET-CT 12/13/2021. FINDINGS: CT CHEST FINDINGS Cardiovascular: No acute vascular findings are evident. Diffuse atherosclerosis of the aorta, great vessels and coronary arteries status post median sternotomy and CABG. The heart size is normal. There is no pericardial effusion. Mediastinum/Nodes: Stable 8 mm right paratracheal node on image 21/2 which was not significantly hypermetabolic on PET-CT. Additional small mediastinal lymph nodes are stable. No axillary adenopathy. The thyroid gland, trachea and esophagus demonstrate no significant findings. Lungs/Pleura: Cavitation of the right lower lobe mass is again noted with a central air-fluid level. This appears similar to the recent chest CTA, measuring approximately 6.3 x 4.2 cm on image 36/2. The margins of this mass are partly obscured by surrounding right lower lobe airspace disease. Surrounding areas of nodular peribronchovascular thickening and ground-glass opacity have increased compared with the recent CT, especially in the right middle lobe. Scattered ill-defined nodular densities in the left lung are similar. Underlying mild centrilobular and paraseptal emphysema. Musculoskeletal/Chest wall: No chest wall mass or suspicious  osseous findings. Healed median sternotomy. CT ABDOMEN AND PELVIS FINDINGS Hepatobiliary: The liver is normal in density without suspicious focal abnormality. Small gallstones are noted. No evidence of gallbladder wall thickening or biliary dilatation. Pancreas: Unremarkable. No pancreatic ductal dilatation or surrounding inflammatory changes. Spleen: The spleen measures 14.7 x 7.4 x 12.6 cm (volume = 720 cm^3), consistent with mild splenomegaly, unchanged. No focal abnormality identified. Adrenals/Urinary Tract: Both adrenal glands appear normal. The kidneys appear normal without evidence of urinary tract calculus, suspicious lesion or hydronephrosis. The bladder appears normal for its degree of distention. Stomach/Bowel: No enteric contrast administered. The stomach appears unremarkable for its degree of distension. No evidence of bowel wall thickening, distention or surrounding inflammatory change. The appendix appears normal. Vascular/Lymphatic: There are no enlarged abdominal or pelvic lymph nodes. Diffuse aortic and branch vessel atherosclerosis without evidence of aneurysm or large vessel occlusion. There is high-grade luminal narrowing of the left common iliac artery. Reproductive: Stable central calcifications within the prostate gland. Other: Prominent fat in both inguinal canals. No ascites or peritoneal nodularity. Possible postsurgical changes at the umbilicus without recurrent hernia. Musculoskeletal: No acute or significant osseous findings. Mild spondylosis, greatest at L4-5 where there is a grade 1 degenerative anterolisthesis. IMPRESSION: 1. Cavitation of previously demonstrated hypermetabolic right lower lobe mass, consistent with response to treatment. Appearance is similar to chest CTA of 10 days prior. 2. Increasing surrounding peribronchovascular nodularity and ground-glass opacities, especially in the right middle and lower lobes which may be treatment related or secondary to postobstructive  pneumonitis. Attention on follow-up recommended. 3. No evidence of metastatic disease within the abdomen or pelvis. 4. Stable mild splenomegaly and cholelithiasis. 5. Coronary and aortic atherosclerosis (ICD10-I70.0). Emphysema (ICD10-J43.9). High-grade luminal narrowing of the left common iliac artery. Electronically Signed   By: WRichardean SaleM.D.   On: 02/22/2022 10:23   DG Chest Portable 1 View  Result Date: 02/22/2022 CLINICAL DATA:  Shortness of breath for a few days. History of lung cancer. EXAM: PORTABLE CHEST 1 VIEW COMPARISON:  Chest CT 02/12/2022. Radiographs 02/12/2022 and 02/04/2022. FINDINGS: 0732 hours. The heart size and mediastinal contours are stable status post median sternotomy and CABG. Cavitary right lower lobe mass is partially obscured by increasing right basilar airspace disease. There may be a small amount of pleural fluid on the right. No evidence of pneumothorax. The bones appear unchanged. Telemetry leads  overlie the chest. IMPRESSION: Increased right basilar airspace disease, partially obscuring the known cavitary mass. Findings may be secondary to radiation pneumonitis or pneumonia. Electronically Signed   By: Richardean Sale M.D.   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  He presented with a large right lower lobe lung mass in addition to satellite nodules close to the mass and suspicious mediastinal lymphadenopathy.  He was diagnosed in May 2023.   His PD-L1 expression is 1%.  The patient completed a course of concurrent chemoradiation with weekly carboplatin for an AUC of 2 and paclitaxel 45 mg per metered square.  He is status post 6 cycles.  The patient has been having fatigue and radiation-induced esophagitis.  The patient's last day radiation was on 02/08/2022.   The patient's last appointment on 03/06/2022, Dr.  Julien Nordmann reviewed his most recent restaging CT scan.  The scan showed a positive response to treatment.  It is a decrease in size of the right lower lobe lung mass which is now cavitary.  However, at that time at that appointment, the patient was not in adequate shape to discuss further treatment.  The patient was given prednisone and encouraged to increase his protein intake.  Since last being seen the patient's condition is slowly improving.  Therefore Dr. Julien Nordmann discussed the option of consolidation immunotherapy with Imfinzi 1500 mg IV every 4 weeks for total of 1 year unless evidence of disease progression or or unacceptable toxicity.  The patient is interested in this option and he is expected to start this on 9/6 or 9/7 (after he completes his prednisone taper).  I have sent in 10 mg tablets of prednisone to take once daily for the next 7 days.  I discussed with her the adverse effect of the immunotherapy including but not limited to immunotherapy mediated skin rash, diarrhea, inflammation of the lung, kidney, liver, thyroid or other endocrine dysfunction  We will arrange for the patient to receive his first infusion next week.  We will see him back for follow-up visit in 5 weeks for evaluation and repeat blood work before starting cycle #2.  Patient's labs today show AKI with a creatinine around 2.  The patient feels like he is urinating okay.  We will arrange for the patient to receive 1 L of fluid over 2 hours today.  We will recheck his lab work next week to ensure improvement and that he does not need any additional fluids.  The patient denies any history of congestive heart failure.  We will continue taking his iron supplement with Integra plus.  The patient was encouraged to elevate his lower extremities and use compression stockings if needed.   His wife was wondering if he can perform his normal activities of daily living.  I discussed that there is no activity restriction from  our standpoint as long as the patient feels well and safe enough to perform activities.  The patient has only been taking half of his blood pressure pill.  They have been checking his blood pressure at home.  Discussed that his blood pressure systolic is greater than 825/053 that is okay to take his blood pressure medication as prescribed and to monitor.  If this drops his blood pressure significantly, then they may need to we discussed with his PCP about taking a lower dose.  He took his blood pressure  medication half a tablet this morning and his blood pressure is 140/93 in the office today.  The patient was advised to call immediately if she has any concerning symptoms in the interval. The patient voices understanding of current disease status and treatment options and is in agreement with the current care plan. All questions were answered. The patient knows to call the clinic with any problems, questions or concerns. We can certainly see the patient much sooner if necessary    Orders Placed This Encounter  Procedures   CBC with Differential ([facility] Only)    Standing Status:   Future    Standing Expiration Date:   03/28/2023   CMP ([facility] only)    Standing Status:   Future    Standing Expiration Date:   03/28/2023   T4    Standing Status:   Future    Standing Expiration Date:   03/28/2023   TSH    Standing Status:   Future    Standing Expiration Date:   03/28/2023   CBC with Differential ([facility] Only)    Standing Status:   Future    Standing Expiration Date:   04/25/2023   CMP ([facility] only)    Standing Status:   Future    Standing Expiration Date:   04/25/2023   CBC with Differential ([facility] Only)    Standing Status:   Future    Standing Expiration Date:   05/23/2023   CMP ([facility] only)    Standing Status:   Future    Standing Expiration Date:   05/23/2023   T4    Standing Status:   Future    Standing Expiration Date:   05/23/2023   TSH     Standing Status:   Future    Standing Expiration Date:   05/23/2023   CBC with Differential ([facility] Only)    Standing Status:   Future    Standing Expiration Date:   06/20/2023   CMP ([facility] only)    Standing Status:   Future    Standing Expiration Date:   06/20/2023   CBC with Differential ([facility] Only)    Standing Status:   Future    Standing Expiration Date:   07/18/2023   CMP ([place] only)    Standing Status:   Future    Standing Expiration Date:   07/18/2023   CBC with Differential ([facility] Only)    Standing Status:   Future    Standing Expiration Date:   08/15/2023   CMP ([facility] only)    Standing Status:   Future    Standing Expiration Date:   08/15/2023   T4    Standing Status:   Future    Standing Expiration Date:   08/15/2023   TSH    Standing Status:   Future    Standing Expiration Date:   08/15/2023   CBC with Differential ([facility] Only)    Standing Status:   Standing    Number of Occurrences:   13    Standing Expiration Date:   03/20/2023   CMP ([facility] only)    Standing Status:   Standing    Number of Occurrences:   13    Standing Expiration Date:   03/20/2023   TSH    Standing Status:   Standing    Number of Occurrences:   13    Standing Expiration Date:   03/20/2023       [facility]  L Laverda Stribling, PA-C 03/19/22  ADDENDUM: Hematology/Oncology Attending: I had a face-to-face encounter with the patient today.  I reviewed his record, lab, scan and recommended his care plan.  This is a very pleasant 71 years old white male with stage IIIb (T4, N2, M0) non-small cell lung cancer, squamous cell carcinoma diagnosed in May 2023 with PD-L1 expression of 1%.  The patient underwent a course of concurrent chemoradiation with weekly carboplatin and paclitaxel last dose was given on February 04, 2022 for 6 cycles. He has a rough time with this treatment and he was admitted to the hospital few times with significant fatigue and  weakness as well as suspicious pneumonia.  He has been on a tapered dose of prednisone recently for skin rash and lack of appetite and bilateral lower extremity swelling.  The symptoms are much better and the skin rash is better. I recommended for the patient to continue with a tapered dose of prednisone and he will be on 10 mg p.o. daily for this coming week. I had a lengthy discussion with the patient and his wife about the next option in his treatment including consolidation immunotherapy versus continuous observation and monitoring. The patient and his wife are interested in proceeding with the immunotherapy and he will be treated with Imfinzi 1500 Mg IV every 4 weeks.  He will start the first dose of this treatment next week. I discussed with the patient the adverse effect of this treatment including but not limited to immunotherapy mediated skin rash, diarrhea, inflammation of the lung, kidney, liver, thyroid or other endocrine dysfunction. The patient will come back for follow-up visit in 5 weeks with the start of cycle #2 of his treatment. He was advised to call immediately if he has any other concerning symptoms in the interval. The total time spent in the appointment was 30 minutes. Disclaimer: This note was dictated with voice recognition software. Similar sounding words can inadvertently be transcribed and may be missed upon review. Eilleen Kempf, MD

## 2022-03-14 ENCOUNTER — Ambulatory Visit: Payer: Medicare HMO | Admitting: Dietician

## 2022-03-14 NOTE — Progress Notes (Signed)
Nutrition Assessment: Reached out to patient at home telephone number.    Reason for Assessment: MST for weight loss, follow up on IP RD   ASSESSMENT: Patient is 71 year old male with medical history of small cell lung cancer, s/p chemo and radiation therapy, diabetes mellitus type 2, hypertension, and hyperlipidemia. He reports he is eating better now.  Has some trouble chewing some meats (beef roast, dry chicken), no other nutrition impact symptoms.  Bowels are regular. No food allergies.   Usual intake:   Breakfast cereal bar, Premiere Protein shake Lunch:  sandwich body armour Dinner: Hot Dog & chips Fried okra, tomatoes, potatoes Boost   Enjoys some fruits (fruit cups, mandarin oranges, uses applesauce to get meds down) Little milk or Vit D rich foods other than ONS, does enjoy cheese for calcium.  Nutrition Focused Physical Exam: unable to perform NFPE   Medications: Metformin, Vit B12, Oral iron   Labs: 03/01/22  Hgb 8.4   Anthropometrics:   Height: 70' Weight:  03/06/22  184.3# 02/22/22  172# 02/12/22  182# UBW: 220#, intentionally tried to lose some weight BMI: 26.44   Estimated Nutritional Needs:    Kcal:  2300-2500   Protein:  110-125 g   Fluid:  2.3L/day   NUTRITION DIAGNOSIS:    Increased nutrient needs related to cancer and cancer related treatments as evidenced by estimated needs.     INTERVENTION:   Educated on importance of adequate calorie and protein energy intake  with nutrient dense foods when possible to maintain weight/strength Encouraged soft moist high protein foods ground meats and chicken, beans and legumes.  Encouraged more plant based for phytonutrients. Discussed strategies for improving anemia, and soft easy to chew protein. Emailed Nutrition Tip sheet  for  Nutrition During Cancer Treatment, soft moist protein and anemia.  Suggested he ask PCP to monitor Vit D. Contact information provided.  MONITORING, EVALUATION, GOAL: weight  trends, nutrition impact symptoms, PO intake, labs  Patient DBW 190-195#, goal no more than 2-5# per month gain.  Next Visit: PRN at patient or provider request  April Manson, RDN, LDN Registered Dietitian, [facility] Part Time Remote (Usual office hours: Tuesday-Thursday) Mobile: [phone] Remote Office: [phone]

## 2022-03-19 ENCOUNTER — Inpatient Hospital Stay: Payer: Medicare HMO

## 2022-03-19 ENCOUNTER — Encounter: Payer: Self-pay | Admitting: Physician Assistant

## 2022-03-19 ENCOUNTER — Other Ambulatory Visit: Payer: Self-pay

## 2022-03-19 ENCOUNTER — Inpatient Hospital Stay (HOSPITAL_BASED_OUTPATIENT_CLINIC_OR_DEPARTMENT_OTHER): Payer: Medicare HMO | Admitting: Physician Assistant

## 2022-03-19 ENCOUNTER — Inpatient Hospital Stay (HOSPITAL_BASED_OUTPATIENT_CLINIC_OR_DEPARTMENT_OTHER): Payer: Medicare HMO

## 2022-03-19 VITALS — BP 140/93 | HR 82 | Temp 97.7°F | Resp 17 | Ht 70.0 in | Wt 192.6 lb

## 2022-03-19 DIAGNOSIS — C3431 Malignant neoplasm of lower lobe, right bronchus or lung: Secondary | ICD-10-CM | POA: Diagnosis not present

## 2022-03-19 DIAGNOSIS — R21 Rash and other nonspecific skin eruption: Secondary | ICD-10-CM | POA: Diagnosis not present

## 2022-03-19 DIAGNOSIS — Z7952 Long term (current) use of systemic steroids: Secondary | ICD-10-CM | POA: Diagnosis not present

## 2022-03-19 DIAGNOSIS — R059 Cough, unspecified: Secondary | ICD-10-CM | POA: Diagnosis not present

## 2022-03-19 DIAGNOSIS — K208 Other esophagitis without bleeding: Secondary | ICD-10-CM | POA: Diagnosis not present

## 2022-03-19 DIAGNOSIS — Z7189 Other specified counseling: Secondary | ICD-10-CM

## 2022-03-19 DIAGNOSIS — D649 Anemia, unspecified: Secondary | ICD-10-CM

## 2022-03-19 DIAGNOSIS — N179 Acute kidney failure, unspecified: Secondary | ICD-10-CM

## 2022-03-19 DIAGNOSIS — I252 Old myocardial infarction: Secondary | ICD-10-CM | POA: Diagnosis not present

## 2022-03-19 DIAGNOSIS — I959 Hypotension, unspecified: Secondary | ICD-10-CM | POA: Diagnosis not present

## 2022-03-19 DIAGNOSIS — C3491 Malignant neoplasm of unspecified part of right bronchus or lung: Secondary | ICD-10-CM

## 2022-03-19 DIAGNOSIS — D509 Iron deficiency anemia, unspecified: Secondary | ICD-10-CM | POA: Diagnosis not present

## 2022-03-19 DIAGNOSIS — R000 Tachycardia, unspecified: Secondary | ICD-10-CM | POA: Diagnosis not present

## 2022-03-19 LAB — CBC WITH DIFFERENTIAL (CANCER CENTER ONLY)
Abs Immature Granulocytes: 0.01 10*3/uL (ref 0.00–0.07)
Basophils Absolute: 0 10*3/uL (ref 0.0–0.1)
Basophils Relative: 0 %
Eosinophils Absolute: 0.1 10*3/uL (ref 0.0–0.5)
Eosinophils Relative: 2 %
HCT: 28 % — ABNORMAL LOW (ref 39.0–52.0)
Hemoglobin: 9 g/dL — ABNORMAL LOW (ref 13.0–17.0)
Immature Granulocytes: 0 %
Lymphocytes Relative: 24 %
Lymphs Abs: 0.9 10*3/uL (ref 0.7–4.0)
MCH: 28.3 pg (ref 26.0–34.0)
MCHC: 32.1 g/dL (ref 30.0–36.0)
MCV: 88.1 fL (ref 80.0–100.0)
Monocytes Absolute: 0.2 10*3/uL (ref 0.1–1.0)
Monocytes Relative: 6 %
Neutro Abs: 2.7 10*3/uL (ref 1.7–7.7)
Neutrophils Relative %: 68 %
Platelet Count: 141 10*3/uL — ABNORMAL LOW (ref 150–400)
RBC: 3.18 MIL/uL — ABNORMAL LOW (ref 4.22–5.81)
RDW: 20.8 % — ABNORMAL HIGH (ref 11.5–15.5)
WBC Count: 3.9 10*3/uL — ABNORMAL LOW (ref 4.0–10.5)
nRBC: 0 % (ref 0.0–0.2)

## 2022-03-19 LAB — CMP (CANCER CENTER ONLY)
ALT: 12 U/L (ref 0–44)
AST: 23 U/L (ref 15–41)
Albumin: 2.3 g/dL — ABNORMAL LOW (ref 3.5–5.0)
Alkaline Phosphatase: 45 U/L (ref 38–126)
Anion gap: 4 — ABNORMAL LOW (ref 5–15)
BUN: 34 mg/dL — ABNORMAL HIGH (ref 8–23)
CO2: 26 mmol/L (ref 22–32)
Calcium: 7.8 mg/dL — ABNORMAL LOW (ref 8.9–10.3)
Chloride: 109 mmol/L (ref 98–111)
Creatinine: 2.05 mg/dL — ABNORMAL HIGH (ref 0.61–1.24)
GFR, Estimated: 34 mL/min — ABNORMAL LOW (ref >60)
Glucose, Bld: 94 mg/dL (ref 70–99)
Potassium: 3.9 mmol/L (ref 3.5–5.1)
Sodium: 139 mmol/L (ref 135–145)
Total Bilirubin: 0.4 mg/dL (ref 0.3–1.2)
Total Protein: 5 g/dL — ABNORMAL LOW (ref 6.5–8.1)

## 2022-03-19 LAB — FERRITIN: Ferritin: 1473 ng/mL — ABNORMAL HIGH (ref 24–336)

## 2022-03-19 MED ORDER — SODIUM CHLORIDE 0.9 % IV SOLN: INTRAVENOUS | Status: DC

## 2022-03-19 MED ORDER — PREDNISONE 10 MG PO TABS: 10.0000 mg | ORAL_TABLET | Freq: Every day | ORAL | 0 refills | Status: DC

## 2022-03-19 NOTE — Patient Instructions (Signed)

## 2022-03-19 NOTE — Progress Notes (Signed)
DISCONTINUE ON PATHWAY REGIMEN - Non-Small Cell Lung     Administer weekly:     Paclitaxel      Carboplatin   **Always confirm dose/schedule in your pharmacy ordering system**  REASON: Continuation Of Treatment PRIOR TREATMENT: [id number]: Carboplatin AUC=2 + Paclitaxel 45 mg/m2 Weekly During Radiation TREATMENT RESPONSE: Partial Response (PR)  START ON PATHWAY REGIMEN - Non-Small Cell Lung     A cycle is every 28 days:     Durvalumab   **Always confirm dose/schedule in your pharmacy ordering system**  Patient Characteristics: Preoperative or Nonsurgical Candidate (Clinical Staging), Stage III - Nonsurgical Candidate (Nonsquamous and Squamous), PS = 0, 1 Therapeutic Status: Preoperative or Nonsurgical Candidate (Clinical Staging) AJCC T Category: cT4 AJCC N Category: cN2 AJCC M Category: cM0 AJCC 8 Stage Grouping: IIIB ECOG Performance Status: 1 Intent of Therapy: Curative Intent, Discussed with Patient

## 2022-03-19 NOTE — Patient Instructions (Signed)
-  We covered a lot of important information at your appointment today regarding what the treatment plan is moving forward. Here are the the main points that were discussed at your office visit with [place] today:  -The treatment will consist of a new medication. This is not chemotherapy. This new drug is a type of Immunotherapy called Imfinzi (Durvalumab).  -We are planning on starting your treatment next week on _/_/_  -Your treatment will be given once every 4 weeks. You will receive this treatment every four weeks for a total of 1 year (13 total treatments) unless you experience unacceptable toxicity or if there is evidence on your routine CT scans that the cancer is growing  -We will get a CT scan after every 3 treatments to check on the progress of treatment  Side Effects:  -The adverse effect of the immunotherapy including but not limited to immunotherapy mediated skin rash, diarrhea, inflammation of the lung, kidney, liver, thyroid or other endocrine dysfunction  Follow up:  -We will see you back for a follow up visit in __.

## 2022-03-20 ENCOUNTER — Encounter: Payer: Self-pay | Admitting: Urology

## 2022-03-20 ENCOUNTER — Other Ambulatory Visit: Payer: Self-pay | Admitting: Radiation Oncology

## 2022-03-20 NOTE — Progress Notes (Signed)
  Radiation Oncology         [phone]) [phone] ________________________________  Name: Jorge Adams MRN: [id number]  Date: 02/08/2022  DOB: 03/04/1951   End of Treatment Note  Diagnosis:    71 y.o. gentleman with Stage T4 N2 Mx squamous cell carcinoma of the right lower lung - Stage IIIB     Indication for treatment:  Curative, Chemo-Radiotherapy       Radiation treatment dates:   12/25/21 - 02/08/22  Site/dose:   The primary tumor and involved mediastinal adenopathy were treated to 66 Gy in 33 fractions of 2 Gy; concurrent with systemic chemotherapy.  Beams/energy:   A five field 3D conformal treatment arrangement was used delivering 6 and 10 MV photons.  Daily image-guidance CT was used to align the treatment with the targeted volume  Narrative: The patient tolerated radiation treatment relatively well.  The patient experienced some esophagitis characterized as mild-moderate, decreased appetite, shortness of breath and productive cough.  The patient also noted fatigue.  Plan: The patient has completed radiation treatment. The patient will return to radiation oncology clinic for routine followup in one month. I advised him to call or return sooner if he has any questions or concerns related to his recovery or treatment.  ________________________________  Sheral Apley. Tammi Klippel, M.D.

## 2022-03-20 NOTE — Progress Notes (Signed)
Telephone appointment. I spoke w/ patient's spouse Mrs. Mallory Shirk, verified her identity and began nursing interview. She reports patient is having some SOB, cough w/ productive clear-yellow mucus. No other issues reported at this time.  Meaningful use complete.  Reminded spouse of patient's 11:00am-03/21/22 telephone appointment w/ Ashlyn Bruning PA-C. I left my extension [phone] in case patient needs anything. Spouse verbalized understanding.  Patient contact [phone]- spouse

## 2022-03-21 ENCOUNTER — Ambulatory Visit
Admission: RE | Admit: 2022-03-21 | Discharge: 2022-03-21 | Disposition: A | Discharge status: Home / Self Care | Payer: Medicare HMO | Source: Ambulatory Visit | Attending: Urology | Admitting: Urology

## 2022-03-21 DIAGNOSIS — C3491 Malignant neoplasm of unspecified part of right bronchus or lung: Secondary | ICD-10-CM

## 2022-03-26 NOTE — Progress Notes (Signed)
Radiation Oncology         [phone] ________________________________  Name: Jorge Adams MRN: [id number]  Date: 03/21/2022  DOB: [birth year]  Post Treatment Note  CC: [name], MD  [name], MD  Diagnosis:   71 y.o. gentleman with Stage T4 N2 Mx squamous cell carcinoma of the right lower lung - Stage IIIB     Interval Since Last Radiation:  6 weeks  12/25/21 - 02/08/22: The primary tumor and involved mediastinal adenopathy were treated to 66 Gy in 33 fractions of 2 Gy; concurrent with systemic chemotherapy.  Narrative:  I spoke with the patient to conduct his routine scheduled 1 month follow up visit via telephone to spare the patient unnecessary potential exposure in the healthcare setting during the current COVID-19 pandemic.  The patient was notified in advance and gave permission to proceed with this visit format.  He tolerated radiation treatment relatively well.  The patient experienced some esophagitis characterized as mild-moderate, decreased appetite, shortness of breath and productive cough.  The patient also noted fatigue.                              On review of systems, the patient states that he is doing well in general.  He continues with gradual improvement in the esophagitis which is almost completely resolved at this point.  He also continues with improved appetite and is getting his strength back.  He denies any increased shortness of breath or coloration to the phlegm when coughing, specifically no hemoptysis.  He has not had recent fevers, chills or night sweats and overall, he is pleased with his progress to date.  On recent CT C/A/P 02/22/2022, there appeared to be a good response to treatment with a decreased size of the treated RLL mass, now cavitating and no evidence of metastatic disease or new lung lesions.  The current plan is to start consolidative immunotherapy on 03/27/2022 with durvalumab.  ALLERGIES:  has No Known  Allergies.  Meds: Current Outpatient Medications  Medication Sig Dispense Refill   albuterol (VENTOLIN HFA) 108 (90 Base) MCG/ACT inhaler Inhale 1-2 puffs into the lungs every 6 (six) hours as needed (for cough.  okay to fill with albuterol/proair/ventolin.). (Patient taking differently: Inhale 2 puffs into the lungs 3 (three) times daily as needed for shortness of breath.) 8 g 2   aspirin EC 81 MG tablet Take 1 tablet (81 mg total) by mouth daily. Okay to restart this medication on 12/05/2021. 30 tablet 11   benzonatate (TESSALON) 200 MG capsule Take 1 capsule (200 mg total) by mouth 3 (three) times daily as needed. (Patient not taking: Reported on 03/20/2022) 30 capsule 3   blood glucose meter kit and supplies Dispense based on pt and insurance preference. Use up to 3 times daily as directed. (Dx. E11.9). 1 each 12   Blood Pressure Monitoring (SPHYGMOMANOMETER) MISC Use daily to check BP.  Dx I10 1 each 0   brimonidine (ALPHAGAN P) 0.1 % SOLN Place 2 drops into both eyes 2 (two) times daily.     cilostazol (PLETAL) 100 MG tablet Take 1 tablet (100 mg total) by mouth 2 (two) times daily. Okay to restart this medication on 12/05/2021. 180 tablet 1   clindamycin (CLEOCIN) 300 MG capsule Take 1 capsule (300 mg total) by mouth 3 (three) times daily. (Patient not taking: Reported on 03/20/2022) 30 capsule 0   Cyanocobalamin (VITAMIN B12 PO) Take  1 tablet by mouth in the morning and at bedtime.     dorzolamide-timolol (COSOPT) 22.3-6.8 MG/ML ophthalmic solution Place 1 drop into both eyes 2 (two) times daily.     FeFum-FePoly-FA-B Cmp-C-Biot (INTEGRA PLUS) CAPS Take 1 capsule by mouth every morning. 30 capsule 2   fenofibrate (TRICOR) 145 MG tablet TAKE 1 TABLET BY MOUTH EVERY DAY IN THE EVENING 90 tablet 1   guaiFENesin (MUCINEX) 600 MG 12 hr tablet Take 1 tablet (600 mg total) by mouth 2 (two) times daily. 14 tablet 0   HYDROcodone bit-homatropine (HYCODAN) 5-1.5 MG/5ML syrup Take 5 mLs by mouth every 8  (eight) hours as needed for cough (sedation caution.). 120 mL 0   ipratropium-albuterol (DUONEB) 0.5-2.5 (3) MG/3ML SOLN Take 3 mLs by nebulization every 6 (six) hours as needed. 360 mL 1   metFORMIN (GLUCOPHAGE) 500 MG tablet Take 1 tablet (500 mg total) by mouth 2 (two) times daily with a meal.     metoprolol tartrate (LOPRESSOR) 25 MG tablet Take 0.5 tablets (12.5 mg total) by mouth 2 (two) times daily. Hold for SBP less than 110 90 tablet 4   Netarsudil-Latanoprost (ROCKLATAN) 0.02-0.005 % SOLN Apply 1 drop to eye at bedtime. In both eyes     nitroGLYCERIN (NITROSTAT) 0.4 MG SL tablet Place 1 tablet (0.4 mg total) under the tongue every 5 (five) minutes as needed for chest pain. 25 tablet 5   omega-3 acid ethyl esters (LOVAZA) 1 g capsule TAKE 1 CAPSULE BY MOUTH TWICE A DAY 180 capsule 1   predniSONE (DELTASONE) 10 MG tablet Take 1 tablet (10 mg total) by mouth daily with breakfast. 7 tablet 0   prochlorperazine (COMPAZINE) 10 MG tablet Take 1 tablet (10 mg total) by mouth every 6 (six) hours as needed for nausea or vomiting. (Patient taking differently: Take 10 mg by mouth 2 (two) times daily as needed for nausea or vomiting.) 30 tablet 2   rosuvastatin (CRESTOR) 20 MG tablet TAKE 1 TABLET BY MOUTH EVERY DAY 90 tablet 0   sucralfate (CARAFATE) 1 g tablet PLEASE SEE ATTACHED FOR DETAILED DIRECTIONS 120 tablet 2   No current facility-administered medications for this encounter.    Physical Findings:  vitals were not taken for this visit.  Pain Assessment Pain Score: 0-No pain/10 Unable to assess due to telephone follow-up visit format.  Lab Findings: Lab Results  Component Value Date   WBC 3.9 (L) 03/19/2022   HGB 9.0 (L) 03/19/2022   HCT 28.0 (L) 03/19/2022   MCV 88.1 03/19/2022   PLT 141 (L) 03/19/2022     Radiographic Findings: No results found.  Impression/Plan: 1. 71 y.o. gentleman with Stage T4 N2 Mx squamous cell carcinoma of the right lower lung - Stage IIIB. He is  recovering well from the effects of his recent chemoradiation and is currently without complaints.  The current plan is to start consolidative immunotherapy on 03/27/2022 with durvalumab and he will follow-up with Dr. Julien Nordmann on 04/24/2022.  We discussed that while we are happy to continue to participate in his care if clinically indicated, at this point, we will plan to see him back on an as-needed basis.  He knows that he is welcome to call anytime with any questions or concerns related to his previous radiation.   Nicholos Johns, PA-C

## 2022-03-27 ENCOUNTER — Inpatient Hospital Stay: Payer: Medicare HMO

## 2022-03-27 ENCOUNTER — Other Ambulatory Visit: Payer: Self-pay

## 2022-03-27 ENCOUNTER — Inpatient Hospital Stay: Payer: Medicare HMO | Attending: Internal Medicine

## 2022-03-27 ENCOUNTER — Other Ambulatory Visit: Payer: Self-pay | Admitting: Internal Medicine

## 2022-03-27 DIAGNOSIS — C3491 Malignant neoplasm of unspecified part of right bronchus or lung: Secondary | ICD-10-CM

## 2022-03-27 DIAGNOSIS — Z79899 Other long term (current) drug therapy: Secondary | ICD-10-CM | POA: Insufficient documentation

## 2022-03-27 DIAGNOSIS — C3431 Malignant neoplasm of lower lobe, right bronchus or lung: Secondary | ICD-10-CM | POA: Diagnosis not present

## 2022-03-27 DIAGNOSIS — Z5112 Encounter for antineoplastic immunotherapy: Secondary | ICD-10-CM | POA: Insufficient documentation

## 2022-03-27 LAB — CBC WITH DIFFERENTIAL (CANCER CENTER ONLY)
Abs Immature Granulocytes: 0.01 10*3/uL (ref 0.00–0.07)
Basophils Absolute: 0 10*3/uL (ref 0.0–0.1)
Basophils Relative: 1 %
Eosinophils Absolute: 0.1 10*3/uL (ref 0.0–0.5)
Eosinophils Relative: 3 %
HCT: 26.1 % — ABNORMAL LOW (ref 39.0–52.0)
Hemoglobin: 8.5 g/dL — ABNORMAL LOW (ref 13.0–17.0)
Immature Granulocytes: 0 %
Lymphocytes Relative: 37 %
Lymphs Abs: 1.3 10*3/uL (ref 0.7–4.0)
MCH: 28.8 pg (ref 26.0–34.0)
MCHC: 32.6 g/dL (ref 30.0–36.0)
MCV: 88.5 fL (ref 80.0–100.0)
Monocytes Absolute: 0.2 10*3/uL (ref 0.1–1.0)
Monocytes Relative: 6 %
Neutro Abs: 1.9 10*3/uL (ref 1.7–7.7)
Neutrophils Relative %: 53 %
Platelet Count: 142 10*3/uL — ABNORMAL LOW (ref 150–400)
RBC: 2.95 MIL/uL — ABNORMAL LOW (ref 4.22–5.81)
RDW: 19.5 % — ABNORMAL HIGH (ref 11.5–15.5)
WBC Count: 3.5 10*3/uL — ABNORMAL LOW (ref 4.0–10.5)
nRBC: 0 % (ref 0.0–0.2)

## 2022-03-27 LAB — CMP (CANCER CENTER ONLY)
ALT: 15 U/L (ref 0–44)
AST: 28 U/L (ref 15–41)
Albumin: 1.8 g/dL — ABNORMAL LOW (ref 3.5–5.0)
Alkaline Phosphatase: 36 U/L — ABNORMAL LOW (ref 38–126)
Anion gap: 5 (ref 5–15)
BUN: 23 mg/dL (ref 8–23)
CO2: 23 mmol/L (ref 22–32)
Calcium: 7.5 mg/dL — ABNORMAL LOW (ref 8.9–10.3)
Chloride: 111 mmol/L (ref 98–111)
Creatinine: 2.26 mg/dL — ABNORMAL HIGH (ref 0.61–1.24)
GFR, Estimated: 30 mL/min — ABNORMAL LOW (ref >60)
Glucose, Bld: 93 mg/dL (ref 70–99)
Potassium: 3.2 mmol/L — ABNORMAL LOW (ref 3.5–5.1)
Sodium: 139 mmol/L (ref 135–145)
Total Bilirubin: 0.4 mg/dL (ref 0.3–1.2)
Total Protein: 4.8 g/dL — ABNORMAL LOW (ref 6.5–8.1)

## 2022-03-27 LAB — TSH: TSH: 8.308 u[IU]/mL — ABNORMAL HIGH (ref 0.350–4.500)

## 2022-03-27 MED ORDER — LEVOTHYROXINE SODIUM 50 MCG PO TABS
50.0000 ug | ORAL_TABLET | Freq: Every day | ORAL | 1 refills | Status: DC
Start: 2022-03-27 — End: 2022-04-21

## 2022-03-27 NOTE — Progress Notes (Signed)
Ok to treat today with a Scr of 2.26- per Dr. Julien Nordmann. Unable to put an IV into the patient in a timely fashion due to edema. Rescheduled to tomorrow. IV team order placed for appointment time. Consent signed.

## 2022-03-28 ENCOUNTER — Inpatient Hospital Stay: Payer: Medicare HMO

## 2022-03-28 VITALS — BP 141/87 | HR 94 | Temp 97.9°F | Resp 17 | Wt 196.0 lb

## 2022-03-28 DIAGNOSIS — C3431 Malignant neoplasm of lower lobe, right bronchus or lung: Secondary | ICD-10-CM | POA: Diagnosis not present

## 2022-03-28 DIAGNOSIS — C3491 Malignant neoplasm of unspecified part of right bronchus or lung: Secondary | ICD-10-CM

## 2022-03-28 DIAGNOSIS — J189 Pneumonia, unspecified organism: Secondary | ICD-10-CM | POA: Diagnosis not present

## 2022-03-28 DIAGNOSIS — Z79899 Other long term (current) drug therapy: Secondary | ICD-10-CM | POA: Diagnosis not present

## 2022-03-28 DIAGNOSIS — Z5112 Encounter for antineoplastic immunotherapy: Secondary | ICD-10-CM | POA: Diagnosis not present

## 2022-03-28 LAB — T4: T4, Total: 5.7 ug/dL (ref 4.5–12.0)

## 2022-03-28 MED ORDER — SODIUM CHLORIDE 0.9 % IV SOLN
1500.0000 mg | Freq: Once | INTRAVENOUS | Status: AC
  Administered 2022-03-28: 1500 mg via INTRAVENOUS
  Filled 2022-03-28: qty 30

## 2022-03-28 MED ORDER — SODIUM CHLORIDE 0.9 % IV SOLN: Freq: Once | INTRAVENOUS | Status: AC

## 2022-03-28 NOTE — Patient Instructions (Signed)
[facility]  Discharge Instructions: Thank you for choosing [facility] to provide your oncology and hematology care.   If you have a lab appointment with the [facility], please go directly to the [facility] and check in at the registration area.   Wear comfortable clothing and clothing appropriate for easy access to any Portacath or PICC line.   We strive to give you quality time with your provider. You may need to reschedule your appointment if you arrive late (15 or more minutes).  Arriving late affects you and other patients whose appointments are after yours.  Also, if you miss three or more appointments without notifying the office, you may be dismissed from the clinic at the provider's discretion.      For prescription refill requests, have your pharmacy contact our office and allow 72 hours for refills to be completed.    Today you received the following chemotherapy and/or immunotherapy agents: Imfinzi.       To help prevent nausea and vomiting after your treatment, we encourage you to take your nausea medication as directed.  BELOW ARE SYMPTOMS THAT SHOULD BE REPORTED IMMEDIATELY: *FEVER GREATER THAN 100.4 F (38 C) OR HIGHER *CHILLS OR SWEATING *NAUSEA AND VOMITING THAT IS NOT CONTROLLED WITH YOUR NAUSEA MEDICATION *UNUSUAL SHORTNESS OF BREATH *UNUSUAL BRUISING OR BLEEDING *URINARY PROBLEMS (pain or burning when urinating, or frequent urination) *BOWEL PROBLEMS (unusual diarrhea, constipation, pain near the anus) TENDERNESS IN MOUTH AND THROAT WITH OR WITHOUT PRESENCE OF ULCERS (sore throat, sores in mouth, or a toothache) UNUSUAL RASH, SWELLING OR PAIN  UNUSUAL VAGINAL DISCHARGE OR ITCHING   Items with * indicate a potential emergency and should be followed up as soon as possible or go to the Emergency Department if any problems should occur.  Please show the CHEMOTHERAPY ALERT CARD or IMMUNOTHERAPY ALERT CARD at check-in to  the Emergency Department and triage nurse.  Should you have questions after your visit or need to cancel or reschedule your appointment, please contact [facility]  Dept: [phone]  and follow the prompts.  Office hours are 8:00 a.m. to 4:30 p.m. Monday - Friday. Please note that voicemails left after 4:00 p.m. may not be returned until the following business day.  We are closed weekends and major holidays. You have access to a nurse at all times for urgent questions. Please call the main number to the clinic Dept: [phone] and follow the prompts.   For any non-urgent questions, you may also contact your provider using MyChart. We now offer e-Visits for anyone 22 and older to request care online for non-urgent symptoms. For details visit [email or web address].   Also download the MyChart app! Go to the app store, search "MyChart", open the app, select [facility], and log in with your MyChart username and password.  Masks are optional in the cancer centers. If you would like for your care team to wear a mask while they are taking care of you, please let them know. You may have one support person who is at least 73 years old accompany you for your appointments. Durvalumab Injection What is this medication? DURVALUMAB (dur VAL ue mab) treats some types of cancer. It works by helping your immune system slow or stop the spread of cancer cells. It is a monoclonal antibody. This medicine may be used for other purposes; ask your health care provider or pharmacist if you have questions. COMMON BRAND NAME(S): IMFINZI What should  I tell my care team before I take this medication? They need to know if you have any of these conditions: Allogeneic stem cell transplant (uses someone else's stem cells) Autoimmune diseases, such as Crohn disease, ulcerative colitis, lupus History of chest radiation Nervous system problems, such as Guillain-Barre syndrome, myasthenia  gravis Organ transplant An unusual or allergic reaction to durvalumab, other medications, foods, dyes, or preservatives Pregnant or trying to get pregnant Breast-feeding How should I use this medication? This medication is infused into a vein. It is given by your care team in a hospital or clinic setting. A special MedGuide will be given to you before each treatment. Be sure to read this information carefully each time. Talk to your care team about the use of this medication in children. Special care may be needed. Overdosage: If you think you have taken too much of this medicine contact a poison control center or emergency room at once. NOTE: This medicine is only for you. Do not share this medicine with others. What if I miss a dose? Keep appointments for follow-up doses. It is important not to miss your dose. Call your care team if you are unable to keep an appointment. What may interact with this medication? Interactions have not been studied. This list may not describe all possible interactions. Give your health care provider a list of all the medicines, herbs, non-prescription drugs, or dietary supplements you use. Also tell them if you smoke, drink alcohol, or use illegal drugs. Some items may interact with your medicine. What should I watch for while using this medication? Your condition will be monitored carefully while you are receiving this medication. You may need blood work while taking this medication. This medication may cause serious skin reactions. They can happen weeks to months after starting the medication. Contact your care team right away if you notice fevers or flu-like symptoms with a rash. The rash may be red or purple and then turn into blisters or peeling of the skin. You may also notice a red rash with swelling of the face, lips, or lymph nodes in your neck or under your arms. Tell your care team right away if you have any change in your eyesight. Talk to your care  team if you may be pregnant. Serious birth defects can occur if you take this medication during pregnancy and for 3 months after the last dose. You will need a negative pregnancy test before starting this medication. Contraception is recommended while taking this medication and for 3 months after the last dose. Your care team can help you find the option that works for you. Do not breastfeed while taking this medication and for 3 months after the last dose. What side effects may I notice from receiving this medication? Side effects that you should report to your care team as soon as possible: Allergic reactions--skin rash, itching, hives, swelling of the face, lips, tongue, or throat Dry cough, shortness of breath or trouble breathing Eye pain, redness, irritation, or discharge with blurry or decreased vision Heart muscle inflammation--unusual weakness or fatigue, shortness of breath, chest pain, fast or irregular heartbeat, dizziness, swelling of the ankles, feet, or hands Hormone gland problems--headache, sensitivity to light, unusual weakness or fatigue, dizziness, fast or irregular heartbeat, increased sensitivity to cold or heat, excessive sweating, constipation, hair loss, increased thirst or amount of urine, tremors or shaking, irritability Infusion reactions--chest pain, shortness of breath or trouble breathing, feeling faint or lightheaded Kidney injury (glomerulonephritis)--decrease in the amount of  urine, red or dark brown urine, foamy or bubbly urine, swelling of the ankles, hands, or feet Liver injury--right upper belly pain, loss of appetite, nausea, light-colored stool, dark yellow or brown urine, yellowing skin or eyes, unusual weakness or fatigue Pain, tingling, or numbness in the hands or feet, muscle weakness, change in vision, confusion or trouble speaking, loss of balance or coordination, trouble walking, seizures Rash, fever, and swollen lymph nodes Redness, blistering, peeling,  or loosening of the skin, including inside the mouth Sudden or severe stomach pain, bloody diarrhea, fever, nausea, vomiting Side effects that usually do not require medical attention (report these to your care team if they continue or are bothersome): Bone, joint, or muscle pain Diarrhea Fatigue Loss of appetite Nausea Skin rash This list may not describe all possible side effects. Call your doctor for medical advice about side effects. You may report side effects to FDA at 1-800-FDA-1088. Where should I keep my medication? This medication is given in a hospital or clinic. It will not be stored at home. NOTE: This sheet is a summary. It may not cover all possible information. If you have questions about this medicine, talk to your doctor, pharmacist, or health care provider.  2023 Elsevier/Gold Standard (2020-12-27 00:00:00)

## 2022-03-29 ENCOUNTER — Other Ambulatory Visit: Payer: Self-pay

## 2022-03-29 ENCOUNTER — Telehealth: Payer: Self-pay | Admitting: Family Medicine

## 2022-03-29 DIAGNOSIS — C3491 Malignant neoplasm of unspecified part of right bronchus or lung: Secondary | ICD-10-CM

## 2022-03-29 NOTE — Telephone Encounter (Signed)
Noted. Thanks.

## 2022-03-29 NOTE — Telephone Encounter (Signed)
His kidney function was not a good as prior baseline and that could contribute.  As long as he isn't getting worse in the meantime, then I would recheck Monday as scheduled.  If swelling is progressively worse or if more SOB over the weekend, then I think he would need to be rechecked in the meantime.

## 2022-03-29 NOTE — Telephone Encounter (Signed)
Patient notified as instructed by telephone and verbalized understanding. Patient stated that he does not feel that he is any worse. Patient was given ER precautions. Patient already has an appointment scheduled with Dr. Damita Dunnings Monday 04/01/22.

## 2022-03-29 NOTE — Telephone Encounter (Signed)
Patients wife called in stating that she would like Dr Damita Dunnings to look over labs that were done on patient at [facility]. Patient is experiencing swelling in hands and feet. I have him scheduled to be seen in office on Monday 8/11. Wife would still like to receive a phone call form Dr Damita Dunnings.

## 2022-04-01 ENCOUNTER — Ambulatory Visit (INDEPENDENT_AMBULATORY_CARE_PROVIDER_SITE_OTHER): Payer: Medicare HMO | Admitting: Family Medicine

## 2022-04-01 ENCOUNTER — Telehealth: Payer: Self-pay | Admitting: Medical Oncology

## 2022-04-01 ENCOUNTER — Ambulatory Visit (INDEPENDENT_AMBULATORY_CARE_PROVIDER_SITE_OTHER)
Admission: RE | Admit: 2022-04-01 | Discharge: 2022-04-01 | Disposition: A | Discharge status: Home / Self Care | Payer: Medicare HMO | Source: Ambulatory Visit | Attending: Family Medicine | Admitting: Family Medicine

## 2022-04-01 ENCOUNTER — Encounter: Payer: Self-pay | Admitting: Family Medicine

## 2022-04-01 VITALS — BP 132/82 | HR 94 | Temp 97.5°F | Ht 70.0 in | Wt 195.0 lb

## 2022-04-01 DIAGNOSIS — R0602 Shortness of breath: Secondary | ICD-10-CM | POA: Diagnosis not present

## 2022-04-01 DIAGNOSIS — C3491 Malignant neoplasm of unspecified part of right bronchus or lung: Secondary | ICD-10-CM | POA: Diagnosis not present

## 2022-04-01 DIAGNOSIS — R059 Cough, unspecified: Secondary | ICD-10-CM | POA: Diagnosis not present

## 2022-04-01 DIAGNOSIS — C3490 Malignant neoplasm of unspecified part of unspecified bronchus or lung: Secondary | ICD-10-CM | POA: Diagnosis not present

## 2022-04-01 DIAGNOSIS — J90 Pleural effusion, not elsewhere classified: Secondary | ICD-10-CM | POA: Diagnosis not present

## 2022-04-01 DIAGNOSIS — R609 Edema, unspecified: Secondary | ICD-10-CM | POA: Diagnosis not present

## 2022-04-01 MED ORDER — HYDROCODONE BIT-HOMATROP MBR 5-1.5 MG/5ML PO SOLN: 5.0000 mL | Freq: Three times a day (TID) | ORAL | 0 refills | Status: DC | PRN

## 2022-04-01 NOTE — Telephone Encounter (Signed)
-----   Message from Curt Bears, MD sent at 03/27/2022  4:36 PM EDT ----- Please let the patient know that his TSH was elevated secondary to immunotherapy and I will start him on levothyroxine 50 mcg p.o. daily.  I did send the prescription to his pharmacy.  Thank you ----- Message ----- From: Buel Ream, [facility] Sent: 03/27/2022   2:31 PM EDT To: Curt Bears, MD

## 2022-04-01 NOTE — Progress Notes (Unsigned)
Hold metformin give Cr. Sugar has been 100-120.    Recently started on levothyroxine.  TSH recently 8.    SOB at baseline but less active.  Still with some occ cough.  He has fallen twice.  Using a walker at baseline.  Using a shower chair.    Cr elevation d/w pt.     Arms/legs/face puffy but not his trunk.      Update Dr. Julien Nordmann.    2+ BLE edema.   Crackles R>L base. rrr

## 2022-04-01 NOTE — Patient Instructions (Addendum)
Stop metformin for now.  Let me know if your sugar is above 200.   Go to the lab on the way out.   If you have mychart we'll likely use that to update you.    Take care.  Glad to see you. Don't take aleve or ibuprofen.

## 2022-04-02 ENCOUNTER — Encounter: Payer: Self-pay | Admitting: Internal Medicine

## 2022-04-02 LAB — CK: Total CK: 35 U/L (ref 7–232)

## 2022-04-02 LAB — COMPREHENSIVE METABOLIC PANEL
ALT: 13 U/L (ref 0–53)
AST: 26 U/L (ref 0–37)
Albumin: 2 g/dL — ABNORMAL LOW (ref 3.5–5.2)
Alkaline Phosphatase: 41 U/L (ref 39–117)
BUN: 19 mg/dL (ref 6–23)
CO2: 25 mEq/L (ref 19–32)
Calcium: 7.1 mg/dL — ABNORMAL LOW (ref 8.4–10.5)
Chloride: 106 mEq/L (ref 96–112)
Creatinine, Ser: 2.39 mg/dL — ABNORMAL HIGH (ref 0.40–1.50)
GFR: 26.38 mL/min — ABNORMAL LOW (ref >60.00)
Glucose, Bld: 133 mg/dL — ABNORMAL HIGH (ref 70–99)
Potassium: 3.6 mEq/L (ref 3.5–5.1)
Sodium: 139 mEq/L (ref 135–145)
Total Bilirubin: 0.4 mg/dL (ref 0.2–1.2)
Total Protein: 4.9 g/dL — ABNORMAL LOW (ref 6.0–8.3)

## 2022-04-02 LAB — BRAIN NATRIURETIC PEPTIDE: Pro B Natriuretic peptide (BNP): 320 pg/mL — ABNORMAL HIGH (ref 0.0–100.0)

## 2022-04-03 ENCOUNTER — Encounter: Payer: Self-pay | Admitting: Family Medicine

## 2022-04-03 ENCOUNTER — Telehealth: Payer: Self-pay | Admitting: Family Medicine

## 2022-04-03 ENCOUNTER — Other Ambulatory Visit: Payer: Self-pay | Admitting: Family Medicine

## 2022-04-03 DIAGNOSIS — R7989 Other specified abnormal findings of blood chemistry: Secondary | ICD-10-CM

## 2022-04-03 MED ORDER — LEVOFLOXACIN 250 MG PO TABS: ORAL_TABLET | ORAL | 0 refills | Status: DC

## 2022-04-03 NOTE — Assessment & Plan Note (Signed)
Multiple issues to consider.  Recheck chest x-ray and labs today.  See notes on labs and x-ray.  Chest x-ray with concern for pneumonia.  See orders regarding antibiotics.  I think his fluid retention is likely multifactorial with a combination of cancer, pneumonia, and current renal insufficiency.  I am hesitant to start Lasix though that may be needed in the future.  I will update hematology and we will get the patient rechecked in the near future, either here or at hematology.  I appreciate the help of all involved.

## 2022-04-03 NOTE — Telephone Encounter (Signed)
1 other issue.  Please check with patient and see if we can get a urinalysis on him to get information about his kidney function.  If it can be done at a lab visit or if he can drop off a urine sample then I would appreciate it (he may be able to collect it at home and have someone else drop it off).  Thanks.

## 2022-04-04 NOTE — Telephone Encounter (Signed)
LMTCB

## 2022-04-07 ENCOUNTER — Telehealth: Payer: Self-pay | Admitting: Family Medicine

## 2022-04-07 DIAGNOSIS — R0602 Shortness of breath: Secondary | ICD-10-CM

## 2022-04-07 NOTE — Telephone Encounter (Signed)
Called and talked to patient. If we can't get him rescheduled soon with me, then he'll need a lab visit.  Lab orders are in (urine and blood).   He still has some cough but his sputum is now clear and not discolored.  Still with cough and edema noted.

## 2022-04-08 ENCOUNTER — Ambulatory Visit: Payer: Medicare HMO | Admitting: Family Medicine

## 2022-04-08 NOTE — Telephone Encounter (Signed)
Patient will do labs tomorrow at appt.

## 2022-04-08 NOTE — Telephone Encounter (Signed)
Patient is rescheduled for tomorrow 04/09/22 at 3:00 pm.

## 2022-04-09 ENCOUNTER — Ambulatory Visit: Payer: Medicare HMO | Admitting: Family Medicine

## 2022-04-10 ENCOUNTER — Other Ambulatory Visit: Payer: Self-pay

## 2022-04-10 ENCOUNTER — Telehealth: Payer: Self-pay | Admitting: Radiology

## 2022-04-10 ENCOUNTER — Emergency Department (HOSPITAL_COMMUNITY): Payer: Medicare HMO

## 2022-04-10 ENCOUNTER — Inpatient Hospital Stay (HOSPITAL_COMMUNITY)
Admission: EM | Admit: 2022-04-10 | Discharge: 2022-04-17 | DRG: 674 | Disposition: A | Discharge status: Home / Self Care | Payer: Medicare HMO | Attending: Internal Medicine | Admitting: Internal Medicine

## 2022-04-10 ENCOUNTER — Ambulatory Visit (INDEPENDENT_AMBULATORY_CARE_PROVIDER_SITE_OTHER)
Admission: RE | Admit: 2022-04-10 | Discharge: 2022-04-10 | Disposition: A | Discharge status: Home / Self Care | Payer: Medicare HMO | Source: Ambulatory Visit | Attending: Family Medicine | Admitting: Family Medicine

## 2022-04-10 ENCOUNTER — Encounter: Payer: Self-pay | Admitting: Family Medicine

## 2022-04-10 ENCOUNTER — Encounter (HOSPITAL_COMMUNITY): Payer: Self-pay

## 2022-04-10 ENCOUNTER — Ambulatory Visit (INDEPENDENT_AMBULATORY_CARE_PROVIDER_SITE_OTHER): Payer: Medicare HMO | Admitting: Family Medicine

## 2022-04-10 VITALS — BP 128/80 | HR 102 | Temp 97.2°F | Ht 70.0 in | Wt 195.0 lb

## 2022-04-10 DIAGNOSIS — E785 Hyperlipidemia, unspecified: Secondary | ICD-10-CM | POA: Diagnosis present

## 2022-04-10 DIAGNOSIS — R7989 Other specified abnormal findings of blood chemistry: Secondary | ICD-10-CM | POA: Diagnosis not present

## 2022-04-10 DIAGNOSIS — H40009 Preglaucoma, unspecified, unspecified eye: Secondary | ICD-10-CM | POA: Diagnosis present

## 2022-04-10 DIAGNOSIS — Z6826 Body mass index (BMI) 26.0-26.9, adult: Secondary | ICD-10-CM

## 2022-04-10 DIAGNOSIS — R21 Rash and other nonspecific skin eruption: Secondary | ICD-10-CM

## 2022-04-10 DIAGNOSIS — Z79890 Hormone replacement therapy (postmenopausal): Secondary | ICD-10-CM

## 2022-04-10 DIAGNOSIS — E669 Obesity, unspecified: Secondary | ICD-10-CM | POA: Diagnosis present

## 2022-04-10 DIAGNOSIS — Z79899 Other long term (current) drug therapy: Secondary | ICD-10-CM

## 2022-04-10 DIAGNOSIS — Z951 Presence of aortocoronary bypass graft: Secondary | ICD-10-CM

## 2022-04-10 DIAGNOSIS — T451X5A Adverse effect of antineoplastic and immunosuppressive drugs, initial encounter: Secondary | ICD-10-CM | POA: Diagnosis present

## 2022-04-10 DIAGNOSIS — Z7984 Long term (current) use of oral hypoglycemic drugs: Secondary | ICD-10-CM

## 2022-04-10 DIAGNOSIS — I252 Old myocardial infarction: Secondary | ICD-10-CM

## 2022-04-10 DIAGNOSIS — M109 Gout, unspecified: Secondary | ICD-10-CM | POA: Diagnosis present

## 2022-04-10 DIAGNOSIS — E876 Hypokalemia: Secondary | ICD-10-CM | POA: Diagnosis not present

## 2022-04-10 DIAGNOSIS — E1159 Type 2 diabetes mellitus with other circulatory complications: Secondary | ICD-10-CM

## 2022-04-10 DIAGNOSIS — Z87891 Personal history of nicotine dependence: Secondary | ICD-10-CM

## 2022-04-10 DIAGNOSIS — D649 Anemia, unspecified: Secondary | ICD-10-CM | POA: Diagnosis not present

## 2022-04-10 DIAGNOSIS — E039 Hypothyroidism, unspecified: Secondary | ICD-10-CM | POA: Diagnosis present

## 2022-04-10 DIAGNOSIS — C3490 Malignant neoplasm of unspecified part of unspecified bronchus or lung: Secondary | ICD-10-CM | POA: Diagnosis not present

## 2022-04-10 DIAGNOSIS — I509 Heart failure, unspecified: Secondary | ICD-10-CM | POA: Diagnosis not present

## 2022-04-10 DIAGNOSIS — R0602 Shortness of breath: Secondary | ICD-10-CM

## 2022-04-10 DIAGNOSIS — Z833 Family history of diabetes mellitus: Secondary | ICD-10-CM

## 2022-04-10 DIAGNOSIS — Z452 Encounter for adjustment and management of vascular access device: Secondary | ICD-10-CM | POA: Diagnosis not present

## 2022-04-10 DIAGNOSIS — N289 Disorder of kidney and ureter, unspecified: Secondary | ICD-10-CM

## 2022-04-10 DIAGNOSIS — E1151 Type 2 diabetes mellitus with diabetic peripheral angiopathy without gangrene: Secondary | ICD-10-CM | POA: Diagnosis present

## 2022-04-10 DIAGNOSIS — I110 Hypertensive heart disease with heart failure: Secondary | ICD-10-CM | POA: Diagnosis not present

## 2022-04-10 DIAGNOSIS — I152 Hypertension secondary to endocrine disorders: Secondary | ICD-10-CM | POA: Diagnosis present

## 2022-04-10 DIAGNOSIS — Z8249 Family history of ischemic heart disease and other diseases of the circulatory system: Secondary | ICD-10-CM

## 2022-04-10 DIAGNOSIS — R059 Cough, unspecified: Secondary | ICD-10-CM

## 2022-04-10 DIAGNOSIS — I739 Peripheral vascular disease, unspecified: Secondary | ICD-10-CM

## 2022-04-10 DIAGNOSIS — H40059 Ocular hypertension, unspecified eye: Secondary | ICD-10-CM | POA: Diagnosis present

## 2022-04-10 DIAGNOSIS — N179 Acute kidney failure, unspecified: Secondary | ICD-10-CM | POA: Diagnosis not present

## 2022-04-10 DIAGNOSIS — E1121 Type 2 diabetes mellitus with diabetic nephropathy: Secondary | ICD-10-CM | POA: Diagnosis present

## 2022-04-10 DIAGNOSIS — M2550 Pain in unspecified joint: Secondary | ICD-10-CM | POA: Diagnosis not present

## 2022-04-10 DIAGNOSIS — C3491 Malignant neoplasm of unspecified part of right bronchus or lung: Secondary | ICD-10-CM | POA: Diagnosis not present

## 2022-04-10 DIAGNOSIS — Z7982 Long term (current) use of aspirin: Secondary | ICD-10-CM

## 2022-04-10 DIAGNOSIS — E1169 Type 2 diabetes mellitus with other specified complication: Secondary | ICD-10-CM | POA: Diagnosis present

## 2022-04-10 DIAGNOSIS — I5030 Unspecified diastolic (congestive) heart failure: Secondary | ICD-10-CM | POA: Diagnosis not present

## 2022-04-10 DIAGNOSIS — G992 Myelopathy in diseases classified elsewhere: Secondary | ICD-10-CM | POA: Diagnosis present

## 2022-04-10 DIAGNOSIS — I2510 Atherosclerotic heart disease of native coronary artery without angina pectoris: Secondary | ICD-10-CM | POA: Diagnosis not present

## 2022-04-10 DIAGNOSIS — I5031 Acute diastolic (congestive) heart failure: Secondary | ICD-10-CM

## 2022-04-10 DIAGNOSIS — E8779 Other fluid overload: Secondary | ICD-10-CM | POA: Diagnosis present

## 2022-04-10 DIAGNOSIS — J90 Pleural effusion, not elsewhere classified: Secondary | ICD-10-CM | POA: Diagnosis not present

## 2022-04-10 DIAGNOSIS — Z825 Family history of asthma and other chronic lower respiratory diseases: Secondary | ICD-10-CM

## 2022-04-10 DIAGNOSIS — I10 Essential (primary) hypertension: Secondary | ICD-10-CM | POA: Diagnosis not present

## 2022-04-10 LAB — COMPREHENSIVE METABOLIC PANEL
ALT: 13 U/L (ref 0–53)
ALT: 17 U/L (ref 0–44)
AST: 26 U/L (ref 0–37)
AST: 35 U/L (ref 15–41)
Albumin: 2 g/dL — ABNORMAL LOW (ref 3.5–5.2)
Albumin: 1.7 g/dL — ABNORMAL LOW (ref 3.5–5.0)
Alkaline Phosphatase: 46 U/L (ref 39–117)
Alkaline Phosphatase: 48 U/L (ref 38–126)
Anion gap: 9 (ref 5–15)
BUN: 15 mg/dL (ref 6–23)
BUN: 17 mg/dL (ref 8–23)
CO2: 26 mEq/L (ref 19–32)
CO2: 23 mmol/L (ref 22–32)
Calcium: 7 mg/dL — ABNORMAL LOW (ref 8.4–10.5)
Calcium: 7.1 mg/dL — ABNORMAL LOW (ref 8.9–10.3)
Chloride: 104 mEq/L (ref 96–112)
Chloride: 106 mmol/L (ref 98–111)
Creatinine, Ser: 2.54 mg/dL — ABNORMAL HIGH (ref 0.40–1.50)
Creatinine, Ser: 2.52 mg/dL — ABNORMAL HIGH (ref 0.61–1.24)
GFR, Estimated: 26 mL/min — ABNORMAL LOW (ref >60)
GFR: 24.52 mL/min — ABNORMAL LOW (ref >60.00)
Glucose, Bld: 104 mg/dL — ABNORMAL HIGH (ref 70–99)
Glucose, Bld: 150 mg/dL — ABNORMAL HIGH (ref 70–99)
Potassium: 2.8 mEq/L — CL (ref 3.5–5.1)
Potassium: 3.2 mmol/L — ABNORMAL LOW (ref 3.5–5.1)
Sodium: 140 mEq/L (ref 135–145)
Sodium: 138 mmol/L (ref 135–145)
Total Bilirubin: 0.5 mg/dL (ref 0.2–1.2)
Total Bilirubin: 0.7 mg/dL (ref 0.3–1.2)
Total Protein: 5 g/dL — ABNORMAL LOW (ref 6.0–8.3)
Total Protein: 5.2 g/dL — ABNORMAL LOW (ref 6.5–8.1)

## 2022-04-10 LAB — URINALYSIS, ROUTINE W REFLEX MICROSCOPIC
Ketones, ur: NEGATIVE
Leukocytes,Ua: NEGATIVE
Nitrite: NEGATIVE
Specific Gravity, Urine: 1.02 (ref 1.000–1.030)
Total Protein, Urine: >=300 — AB
Urine Glucose: NEGATIVE
Urobilinogen, UA: 0.2 (ref 0.0–1.0)
pH: 6 (ref 5.0–8.0)

## 2022-04-10 LAB — CBC WITH DIFFERENTIAL/PLATELET
Abs Immature Granulocytes: 0.03 10*3/uL (ref 0.00–0.07)
Basophils Absolute: 0 10*3/uL (ref 0.0–0.1)
Basophils Absolute: 0 10*3/uL (ref 0.0–0.1)
Basophils Relative: 0.7 % (ref 0.0–3.0)
Basophils Relative: 0 %
Eosinophils Absolute: 0.1 10*3/uL (ref 0.0–0.7)
Eosinophils Absolute: 0 10*3/uL (ref 0.0–0.5)
Eosinophils Relative: 1.4 % (ref 0.0–5.0)
Eosinophils Relative: 0 %
HCT: 28.1 % — ABNORMAL LOW (ref 39.0–52.0)
HCT: 29.9 % — ABNORMAL LOW (ref 39.0–52.0)
Hemoglobin: 9.4 g/dL — ABNORMAL LOW (ref 13.0–17.0)
Hemoglobin: 9.3 g/dL — ABNORMAL LOW (ref 13.0–17.0)
Immature Granulocytes: 1 %
Lymphocytes Relative: 27.6 % (ref 12.0–46.0)
Lymphocytes Relative: 32 %
Lymphs Abs: 1.4 10*3/uL (ref 0.7–4.0)
Lymphs Abs: 1.7 10*3/uL (ref 0.7–4.0)
MCH: 28.3 pg (ref 26.0–34.0)
MCHC: 33.6 g/dL (ref 30.0–36.0)
MCHC: 31.1 g/dL (ref 30.0–36.0)
MCV: 85.9 fl (ref 78.0–100.0)
MCV: 90.9 fL (ref 80.0–100.0)
Monocytes Absolute: 0.5 10*3/uL (ref 0.1–1.0)
Monocytes Absolute: 0.3 10*3/uL (ref 0.1–1.0)
Monocytes Relative: 10.7 % (ref 3.0–12.0)
Monocytes Relative: 5 %
Neutro Abs: 3 10*3/uL (ref 1.4–7.7)
Neutro Abs: 3.5 10*3/uL (ref 1.7–7.7)
Neutrophils Relative %: 59.6 % (ref 43.0–77.0)
Neutrophils Relative %: 62 %
Platelets: 161 10*3/uL (ref 150.0–400.0)
Platelets: 161 10*3/uL (ref 150–400)
RBC: 3.27 Mil/uL — ABNORMAL LOW (ref 4.22–5.81)
RBC: 3.29 MIL/uL — ABNORMAL LOW (ref 4.22–5.81)
RDW: 18.8 % — ABNORMAL HIGH (ref 11.5–15.5)
RDW: 16.8 % — ABNORMAL HIGH (ref 11.5–15.5)
WBC: 5.1 10*3/uL (ref 4.0–10.5)
WBC: 5.5 10*3/uL (ref 4.0–10.5)
nRBC: 0 % (ref 0.0–0.2)

## 2022-04-10 LAB — TROPONIN I (HIGH SENSITIVITY): Troponin I (High Sensitivity): 21 ng/L — ABNORMAL HIGH (ref <18)

## 2022-04-10 LAB — MAGNESIUM: Magnesium: 1.4 mg/dL — ABNORMAL LOW (ref 1.7–2.4)

## 2022-04-10 LAB — URIC ACID: Uric Acid, Serum: 5.1 mg/dL (ref 4.0–7.8)

## 2022-04-10 LAB — BRAIN NATRIURETIC PEPTIDE: Pro B Natriuretic peptide (BNP): 575 pg/mL — ABNORMAL HIGH (ref 0.0–100.0)

## 2022-04-10 MED ORDER — POTASSIUM CHLORIDE 10 MEQ/100ML IV SOLN
10.0000 meq | INTRAVENOUS | Status: AC
  Administered 2022-04-10 (×2): 10 meq via INTRAVENOUS
  Filled 2022-04-10 (×2): qty 100

## 2022-04-10 MED ORDER — FUROSEMIDE 10 MG/ML IJ SOLN
40.0000 mg | Freq: Once | INTRAMUSCULAR | Status: AC
  Administered 2022-04-10: 40 mg via INTRAVENOUS
  Filled 2022-04-10: qty 4

## 2022-04-10 MED ORDER — SODIUM CHLORIDE 0.9% FLUSH
3.0000 mL | Freq: Two times a day (BID) | INTRAVENOUS | Status: DC
  Administered 2022-04-11 – 2022-04-16 (×11): 3 mL via INTRAVENOUS

## 2022-04-10 MED ORDER — POTASSIUM CHLORIDE CRYS ER 20 MEQ PO TBCR: 20.0000 meq | EXTENDED_RELEASE_TABLET | Freq: Two times a day (BID) | ORAL | 0 refills | Status: DC

## 2022-04-10 MED ORDER — FENOFIBRATE 160 MG PO TABS
160.0000 mg | ORAL_TABLET | Freq: Every day | ORAL | Status: DC
  Administered 2022-04-11 – 2022-04-16 (×6): 160 mg via ORAL
  Filled 2022-04-10 (×7): qty 1

## 2022-04-10 MED ORDER — PREDNISONE 20 MG PO TABS
20.0000 mg | ORAL_TABLET | Freq: Every day | ORAL | Status: DC
  Administered 2022-04-11 – 2022-04-14 (×4): 20 mg via ORAL
  Filled 2022-04-10 (×4): qty 1

## 2022-04-10 MED ORDER — HYDROCODONE-ACETAMINOPHEN 5-325 MG PO TABS
1.0000 | ORAL_TABLET | Freq: Once | ORAL | Status: AC
  Administered 2022-04-10: 1 via ORAL
  Filled 2022-04-10: qty 1

## 2022-04-10 MED ORDER — INSULIN ASPART 100 UNIT/ML IJ SOLN
0.0000 [IU] | Freq: Three times a day (TID) | INTRAMUSCULAR | Status: DC
  Administered 2022-04-11: 2 [IU] via SUBCUTANEOUS
  Administered 2022-04-13: 3 [IU] via SUBCUTANEOUS
  Administered 2022-04-13 – 2022-04-14 (×3): 2 [IU] via SUBCUTANEOUS
  Administered 2022-04-15: 3 [IU] via SUBCUTANEOUS
  Administered 2022-04-15 – 2022-04-16 (×2): 2 [IU] via SUBCUTANEOUS
  Administered 2022-04-17: 1 [IU] via SUBCUTANEOUS

## 2022-04-10 MED ORDER — ASPIRIN 81 MG PO TBEC
81.0000 mg | DELAYED_RELEASE_TABLET | Freq: Every day | ORAL | Status: DC
  Administered 2022-04-11: 81 mg via ORAL
  Filled 2022-04-10: qty 1

## 2022-04-10 MED ORDER — ROSUVASTATIN CALCIUM 20 MG PO TABS
20.0000 mg | ORAL_TABLET | Freq: Every day | ORAL | Status: DC
  Administered 2022-04-11 – 2022-04-17 (×7): 20 mg via ORAL
  Filled 2022-04-10 (×7): qty 1

## 2022-04-10 MED ORDER — ENOXAPARIN SODIUM 30 MG/0.3ML IJ SOSY
30.0000 mg | PREFILLED_SYRINGE | INTRAMUSCULAR | Status: DC
  Administered 2022-04-10: 30 mg via SUBCUTANEOUS
  Filled 2022-04-10: qty 0.3

## 2022-04-10 MED ORDER — ACETAMINOPHEN 325 MG PO TABS: 650.0000 mg | ORAL_TABLET | Freq: Four times a day (QID) | ORAL | Status: DC | PRN

## 2022-04-10 MED ORDER — METOPROLOL TARTRATE 25 MG PO TABS
12.5000 mg | ORAL_TABLET | Freq: Two times a day (BID) | ORAL | Status: DC
  Administered 2022-04-10 – 2022-04-17 (×14): 12.5 mg via ORAL
  Filled 2022-04-10 (×14): qty 1

## 2022-04-10 MED ORDER — MAGNESIUM SULFATE IN D5W 1-5 GM/100ML-% IV SOLN
1.0000 g | Freq: Once | INTRAVENOUS | Status: AC
  Administered 2022-04-10: 1 g via INTRAVENOUS
  Filled 2022-04-10: qty 100

## 2022-04-10 MED ORDER — POTASSIUM CHLORIDE 10 MEQ/100ML IV SOLN
10.0000 meq | INTRAVENOUS | Status: DC
  Administered 2022-04-10: 10 meq via INTRAVENOUS
  Filled 2022-04-10: qty 100

## 2022-04-10 MED ORDER — FUROSEMIDE 10 MG/ML IJ SOLN
40.0000 mg | Freq: Two times a day (BID) | INTRAMUSCULAR | Status: DC
  Administered 2022-04-11: 40 mg via INTRAVENOUS
  Filled 2022-04-10: qty 4

## 2022-04-10 MED ORDER — PREDNISONE 10 MG PO TABS: ORAL_TABLET | ORAL | 0 refills | Status: DC

## 2022-04-10 MED ORDER — POTASSIUM CHLORIDE CRYS ER 20 MEQ PO TBCR
40.0000 meq | EXTENDED_RELEASE_TABLET | Freq: Once | ORAL | Status: AC
  Administered 2022-04-10: 40 meq via ORAL
  Filled 2022-04-10: qty 2

## 2022-04-10 MED ORDER — LEVOTHYROXINE SODIUM 50 MCG PO TABS
50.0000 ug | ORAL_TABLET | Freq: Every day | ORAL | Status: DC
  Administered 2022-04-11 – 2022-04-17 (×7): 50 ug via ORAL
  Filled 2022-04-10 (×7): qty 1

## 2022-04-10 MED ORDER — ACETAMINOPHEN 650 MG RE SUPP: 650.0000 mg | Freq: Four times a day (QID) | RECTAL | Status: DC | PRN

## 2022-04-10 MED ORDER — BRIMONIDINE TARTRATE 0.15 % OP SOLN
1.0000 [drp] | Freq: Two times a day (BID) | OPHTHALMIC | Status: DC
  Administered 2022-04-10 – 2022-04-17 (×14): 1 [drp] via OPHTHALMIC
  Filled 2022-04-10: qty 5

## 2022-04-10 MED ORDER — DORZOLAMIDE HCL-TIMOLOL MAL 2-0.5 % OP SOLN
1.0000 [drp] | Freq: Two times a day (BID) | OPHTHALMIC | Status: DC
  Administered 2022-04-10 – 2022-04-17 (×14): 1 [drp] via OPHTHALMIC
  Filled 2022-04-10: qty 10

## 2022-04-10 MED ORDER — POLYETHYLENE GLYCOL 3350 17 G PO PACK: 17.0000 g | PACK | Freq: Every day | ORAL | Status: DC | PRN

## 2022-04-10 MED ORDER — NETARSUDIL-LATANOPROST 0.02-0.005 % OP SOLN
1.0000 [drp] | Freq: Every day | OPHTHALMIC | Status: DC
  Administered 2022-04-11 – 2022-04-16 (×6): 1 [drp] via OPHTHALMIC

## 2022-04-10 MED ORDER — IPRATROPIUM-ALBUTEROL 0.5-2.5 (3) MG/3ML IN SOLN: 3.0000 mL | Freq: Four times a day (QID) | RESPIRATORY_TRACT | Status: DC | PRN

## 2022-04-10 NOTE — Assessment & Plan Note (Signed)
Discussed options.  Recheck creatinine today along with urinalysis.  I question if he has a gout flare on the left wrist/left thumb is causing his pain.  This was more likely to happen with his elevated creatinine.  Discussed prednisone taper in the meantime while would recheck labs and chest x-ray today.  He agrees with plan.  ==================== Addendum.  Given hematuria and proteinuria, in the setting of progressive creatinine elevation and hypokalemia, along with progressive fatigue and diffuse weakness, I think the best option would be for emergency room evaluation and likely admission at [facility].  I called Dr. Alen Blew with oncology, who is on-call.  He agreed with emergency room evaluation and likely admission.  I called the patient and his wife.  They are going to go to [facility] emergency room.  I called the charge nurse at [facility] about the pending arrival.  I appreciate the help of all involved.

## 2022-04-10 NOTE — Telephone Encounter (Signed)
[facility] called a critical potassium - 2.8, results given to Dr Danise Mina and sent to Dr Damita Dunnings

## 2022-04-10 NOTE — Telephone Encounter (Signed)
Called and spoke with patients wife about results; okay per DPR. Advised rx is at pharmacy. Juliann Pulse would really like to speak with Dr. Damita Dunnings about patient. She has some thing she would like to discuss that she can not really discuss in front of patient without everyone getting upset.

## 2022-04-10 NOTE — Telephone Encounter (Signed)
Plz notify - kidneys remain impaired, potassium returned very low.  I'd like him to start taking potassium tablets I've sent to pharmacy 49mEq twice daily for 5 days.  Will await recommendations from Dr Damita Dunnings on further treatment plan.

## 2022-04-10 NOTE — Progress Notes (Signed)
Fu re: Cr elevation and abnormal CXR.  He isn't lightheaded.  Off metformin.  Sugar was 118 this AM, 100 yesterday.  Done with abx.  Still with some SOB.  Still with cough.  Still with edema.  He feels diffusely weaker in general.  Cough his worse at night.  Sputum is clear now.  No fevers.  Swelling in B feet and L hand.  Ulcer on the L lateral ankle.  He had a small amount of cotton stuck to the scab.  Some nausea w/o vomiting.  No blood in stool.    He took his ring off due to edema, d/w pt.   On no meds currently for DM2.    Meds, vitals, and allergies reviewed.   ROS: Per HPI unless specifically indicated in ROS section   No apparent distress, in wheelchair. Neck supple.  No lymphadenopathy. Decreased breath sounds right lung base.  No wheeze. Rrr L>R hand puffy.  Left thumb and wrist tender to palpation. 2+ pitting edema up to the mid shins.   1cm scab L lateral ankle.    30 minutes were devoted to patient care in this encounter (this includes time spent reviewing the patient's file/history, interviewing and examining the patient, counseling/reviewing plan with patient).

## 2022-04-10 NOTE — Telephone Encounter (Signed)
Called pt/wife.  Prev talked with Dr. Alen Blew, who is on call for hem/onc.  We agreed that patient should be at ER for likely admission.  Called patient and wife, they will go to [facility]  I called [facility] about pending arrival.  Routed to Dr. Julien Nordmann as Juluis Rainier and I appreciate the help of all involved.

## 2022-04-10 NOTE — H&P (Signed)
History and Physical   Jorge Adams LHT:[id number] DOB: [birth year] DOA: 04/10/2022  PCP: [name], MD   Patient coming from: Home/PCP  Chief Complaint: Abnormal labs, shortness of breath, edema  HPI: Jorge Adams is a 71 y.o. male with medical history significant of stage III lung cancer status post radiation and chemotherapy now on immunotherapy, hypothyroidism, hypertension, diabetes, hyperlipidemia, obesity, PAD, CAD, borderline glaucoma, cervical spine stenosis, anemia presenting with abnormal labs with PCP in the setting of ongoing shortness of breath and edema.  Patient has had a week of shortness of breath and edema.  Had had some fatigue previously and was recently diagnosed with hypothyroidism when his TSH came back at 8.  He was started on Synthroid but has not had an improvement in his symptoms with this fact have worsening fatigue/shortness of breath.  As above also with associated lower extremity edema that has become more diffuse.  He was evaluated at PCP office today and had lab work done noted to have elevated creatinine, low potassium, elevated BNP sent to the ED for further evaluation.  Of note is currently being treated for lung cancer as above.  He denies fevers, chills, chest pain, abdominal pain, constipation, diarrhea, nausea, vomiting.  ED Course: Vital signs in the ED significant for blood pressure in the 572I 203T systolic.  Respiratory rate in the teens to 20s.  Lab work-up included CMP with potassium 3.2, creatinine 2.5 up from 2 3 weeks ago and one 1 month ago, glucose 150, calcium 7.1, protein 5.2, albumin 1.7.  CBC with hemoglobin stable at 9.3.  Troponin pending.  No BNP in the ED however BNP checked outpatient was elevated to 575 up from already being elevated at 329 days ago.  Urinalysis pending here outpatient showed bilirubin, hemoglobin, white cells, rare bacteria, granular and hyaline cast.  Magnesium level 1.4 in ED.  Chest x-ray pending.   Patient received Norco, Lasix, 40 mEq of p.o. potassium and 20 mEq IV potassium, 1 g IV magnesium.  Review of Systems: As per HPI otherwise all other systems reviewed and are negative.  Past Medical History:  Diagnosis Date   Arthritis    ASHD (arteriosclerotic heart disease)    Atherosclerosis of native arteries of the extremities with intermittent claudication    Cataract    Coronary atherosclerosis of native coronary artery    Diabetes mellitus    NIDDM   Dyslipidemia    ED (erectile dysfunction)    Full dentures    Glaucoma associated with ocular disorder, mild stage    bilateral   Hyperlipidemia    Hypertension    under control, has been on med. > 9 yr.   Impotence of organic origin    Meniscus tear 05/2012   right knee   Myocardial infarction [facility]) 10/2002   Stented coronary artery     Past Surgical History:  Procedure Laterality Date   ANTERIOR CERVICAL DECOMP/DISCECTOMY FUSION N/A 05/07/2018   Procedure: ANTERIOR CERVICAL DECOMPRESSION/DISCECTOMY FUSION CERVICAL FIVE - CERVICAL SIX;  Surgeon: Consuella Lose, MD;  Location: [place];  Service: Neurosurgery;  Laterality: N/A;  ANTERIOR CERVICAL DECOMPRESSION/DISCECTOMY FUSION CERVICAL FIVE - CERVICAL SIX   BRONCHIAL BIOPSY  12/04/2021   Procedure: BRONCHIAL BIOPSIES;  Surgeon: Collene Gobble, MD;  Location: [place];  Service: Cardiopulmonary;;   BRONCHIAL BRUSHINGS  12/04/2021   Procedure: BRONCHIAL BRUSHINGS;  Surgeon: Collene Gobble, MD;  Location: [place];  Service: Cardiopulmonary;;   BRONCHIAL NEEDLE ASPIRATION BIOPSY  12/04/2021  Procedure: BRONCHIAL NEEDLE ASPIRATION BIOPSIES;  Surgeon: Collene Gobble, MD;  Location: [facility] ENDOSCOPY;  Service: Cardiopulmonary;;   CARDIAC CATHETERIZATION  10/23/2002   had 1 stent placed then-- by Dr. Melvern Banker   CARDIAC CATHETERIZATION N/A 12/21/2015   Procedure: Left Heart Cath and Coronary Angiography;  Surgeon: Adrian Prows, MD;  Location: [place] CV LAB;  Service:  Cardiovascular;  Laterality: N/A;   CATARACT EXTRACTION Right 05/2017   CATARACT EXTRACTION Left 07/2017   COLONOSCOPY  10/08/2010   Dr.Patterson   CORONARY ARTERY BYPASS GRAFT N/A 01/02/2016   Procedure: CORONARY ARTERY BYPASS GRAFTING (CABG) TIMES 3 USING LEFT INTERNAL MAMMARY ARTERY AND RIGHT SAPHENOUS LEG VEIN HARVESTED ENDOSCOPICALLY;  Surgeon: Ivin Poot, MD;  Location: [place];  Service: Open Heart Surgery;  Laterality: N/A;   EYE SURGERY     INSERTION OF MESH N/A 05/06/2016   Procedure: INSERTION OF MESH;  Surgeon: Georganna Skeans, MD;  Location: [place];  Service: General;  Laterality: N/A;   KNEE ARTHROSCOPY WITH MEDIAL MENISECTOMY  06/04/2012   Procedure: KNEE ARTHROSCOPY WITH MEDIAL MENISECTOMY;  Surgeon: Ninetta Lights, MD;  Location: [place];  Service: Orthopedics;  Laterality: Right;  RIGHT SCOPE MEDIAL MENISCECTOMY, CHONDROPLASTY, EXCISION LOOSE BODY   LOWER EXTREMITY ANGIOGRAM N/A 11/23/2013   Procedure: LOWER EXTREMITY ANGIOGRAM;  Surgeon: Laverda Page, MD;  Location: [place] CATH LAB;  Service: Cardiovascular;  Laterality: N/A;   NECK SURGERY N/A    02/19/2018   PERIPHERAL VASCULAR CATHETERIZATION N/A 12/21/2015   Procedure: Abdominal Aortogram;  Surgeon: Adrian Prows, MD;  Location: [place] CV LAB;  Service: Cardiovascular;  Laterality: N/A;   PERIPHERAL VASCULAR CATHETERIZATION N/A 12/21/2015   Procedure: Abdominal Aortogram w/Lower Extremity;  Surgeon: Adrian Prows, MD;  Location: [place] CV LAB;  Service: Cardiovascular;  Laterality: N/A;   TEE WITHOUT CARDIOVERSION N/A 01/02/2016   Procedure: TRANSESOPHAGEAL ECHOCARDIOGRAM (TEE);  Surgeon: Ivin Poot, MD;  Location: [place];  Service: Open Heart Surgery;  Laterality: N/A;   UMBILICAL HERNIA REPAIR N/A 05/06/2016   Procedure: HERNIA REPAIR UMBILICAL ADULT;  Surgeon: Georganna Skeans, MD;  Location: [place];  Service: General;  Laterality: N/A;   VIDEO BRONCHOSCOPY N/A 12/04/2021   Procedure: VIDEO  BRONCHOSCOPY WITH FLUORO;  Surgeon: Collene Gobble, MD;  Location: [place];  Service: Cardiopulmonary;  Laterality: N/A;    Social History  reports that he quit smoking about 19 years ago. His smoking use included cigarettes. He has a 87.50 pack-year smoking history. He has never used smokeless tobacco. He reports current alcohol use. He reports that he does not use drugs.  Allergies  Allergen Reactions   Metformin And Related     Held due to creatinine elevation September 2023.    Family History  Problem Relation Age of Onset   Bone cancer Mother    Pneumonia Father    Diabetes Brother    Heart disease Brother        Pacemaker   COPD Brother    Colon cancer Neg Hx    Prostate cancer Neg Hx    Esophageal cancer Neg Hx    Stomach cancer Neg Hx   Reviewed on admission  Prior to Admission medications   Medication Sig Start Date End Date Taking? Authorizing Provider  albuterol (VENTOLIN HFA) 108 (90 Base) MCG/ACT inhaler Inhale 1-2 puffs into the lungs every 6 (six) hours as needed (for cough.  okay to fill with albuterol/proair/ventolin.). Patient taking differently: Inhale 2 puffs into the lungs  3 (three) times daily as needed for shortness of breath. 11/06/21   [name], MD  aspirin EC 81 MG tablet Take 1 tablet (81 mg total) by mouth daily. Okay to restart this medication on 12/05/2021. 12/04/21   Collene Gobble, MD  blood glucose meter kit and supplies Dispense based on pt and insurance preference. Use up to 3 times daily as directed. (Dx. E11.9). 09/08/20   [name], MD  Blood Pressure Monitoring [facility]) MISC Use daily to check BP.  Dx I10 08/15/20   [name], MD  brimonidine (ALPHAGAN P) 0.1 % SOLN Place 2 drops into both eyes 2 (two) times daily.    [provider]  cilostazol (PLETAL) 100 MG tablet Take 1 tablet (100 mg total) by mouth 2 (two) times daily. Okay to restart this medication on 12/05/2021. 12/04/21   Collene Gobble, MD   Cyanocobalamin (VITAMIN B12 PO) Take 1 tablet by mouth in the morning and at bedtime.    [provider]  dorzolamide-timolol (COSOPT) 22.3-6.8 MG/ML ophthalmic solution Place 1 drop into both eyes 2 (two) times daily.    [provider]  FeFum-FePoly-FA-B Cmp-C-Biot (INTEGRA PLUS) CAPS Take 1 capsule by mouth every morning. 03/06/22   Heilingoetter, [name] L, PA-C  fenofibrate (TRICOR) 145 MG tablet TAKE 1 TABLET BY MOUTH EVERY DAY IN THE EVENING 01/03/22   Adrian Prows, MD  HYDROcodone bit-homatropine (HYCODAN) 5-1.5 MG/5ML syrup Take 5 mLs by mouth every 8 (eight) hours as needed for cough (sedation caution.). 04/01/22   [name], MD  ipratropium-albuterol (DUONEB) 0.5-2.5 (3) MG/3ML SOLN Take 3 mLs by nebulization every 6 (six) hours as needed. 02/25/22   Oswald Hillock, MD  levothyroxine (SYNTHROID) 50 MCG tablet Take 1 tablet (50 mcg total) by mouth daily before breakfast. 03/27/22   Curt Bears, MD  metoprolol tartrate (LOPRESSOR) 25 MG tablet Take 0.5 tablets (12.5 mg total) by mouth 2 (two) times daily. Hold for SBP less than 110 02/25/22   Lama, Marge Duncans, MD  Netarsudil-Latanoprost (ROCKLATAN) 0.02-0.005 % SOLN Apply 1 drop to eye at bedtime. In both eyes    [provider]  nitroGLYCERIN (NITROSTAT) 0.4 MG SL tablet Place 1 tablet (0.4 mg total) under the tongue every 5 (five) minutes as needed for chest pain. 11/06/21   [name], MD  omega-3 acid ethyl esters (LOVAZA) 1 g capsule TAKE 1 CAPSULE BY MOUTH TWICE A DAY 01/03/22   Adrian Prows, MD  potassium chloride SA (KLOR-CON M) 20 MEQ tablet Take 1 tablet (20 mEq total) by mouth 2 (two) times daily. 04/10/22   Ria Bush, MD  predniSONE (DELTASONE) 10 MG tablet Take 2 a day for 5 days, then 1 a day for 5 days, with food. Don't take with aleve/ibuprofen. 04/10/22   [name], MD  prochlorperazine (COMPAZINE) 10 MG tablet Take 1 tablet (10 mg total) by mouth every 6 (six) hours as needed for  nausea or vomiting. Patient taking differently: Take 10 mg by mouth 2 (two) times daily as needed for nausea or vomiting. 02/04/22   Heilingoetter, [name] L, PA-C  rosuvastatin (CRESTOR) 20 MG tablet TAKE 1 TABLET BY MOUTH EVERY DAY 07/30/21   Adrian Prows, MD    Physical Exam: Vitals:   04/10/22 1900 04/10/22 1930 04/10/22 2000 04/10/22 2015  BP: (!) 149/98 (!) 154/89 (!) 147/84 (!) 163/90  Pulse: 98 96 94 96  Resp: (!) 24 (!) 24 18 (!) 25  Temp:  SpO2: 94% 95% 96% 97%    Physical Exam Constitutional:      General: He is not in acute distress.    Appearance: Normal appearance. He is obese. He is not ill-appearing.  HENT:     Head: Normocephalic and atraumatic.     Mouth/Throat:     Mouth: Mucous membranes are moist.     Pharynx: Oropharynx is clear.  Eyes:     Extraocular Movements: Extraocular movements intact.     Pupils: Pupils are equal, round, and reactive to light.  Cardiovascular:     Rate and Rhythm: Normal rate and regular rhythm.     Pulses: Normal pulses.     Heart sounds: Normal heart sounds.  Pulmonary:     Effort: Pulmonary effort is normal. No respiratory distress.     Breath sounds: Rales (Trace) present.  Abdominal:     General: Bowel sounds are normal. There is no distension.     Palpations: Abdomen is soft.     Tenderness: There is no abdominal tenderness.  Musculoskeletal:        General: No swelling or deformity.     Right lower leg: Edema present.     Left lower leg: Edema present.  Skin:    General: Skin is warm and dry.  Neurological:     General: No focal deficit present.     Mental Status: Mental status is at baseline.    Labs on Admission: I have personally reviewed following labs and imaging studies  CBC: Recent Labs  Lab 04/10/22 1035 04/10/22 1950  WBC 5.1 5.5  NEUTROABS 3.0 3.5  HGB 9.4* 9.3*  HCT 28.1* 29.9*  MCV 85.9 90.9  PLT 161.0 161    Basic Metabolic Panel: Recent Labs  Lab 04/10/22 1035 04/10/22 1950  NA  140 138  K 2.8* 3.2*  CL 104 106  CO2 26 23  GLUCOSE 104* 150*  BUN 15 17  CREATININE 2.54* 2.52*  CALCIUM 7.0* 7.1*  MG  --  1.4*    GFR: Estimated Creatinine Clearance: 29.7 mL/min (A) (by C-G formula based on SCr of 2.52 mg/dL (H)).  Liver Function Tests: Recent Labs  Lab 04/10/22 1035 04/10/22 1950  AST 26 35  ALT 13 17  ALKPHOS 46 48  BILITOT 0.5 0.7  PROT 5.0* 5.2*  ALBUMIN 2.0* 1.7*    Urine analysis:    Component Value Date/Time   COLORURINE Brown (A) 04/10/2022 1035   APPEARANCEUR Cloudy (A) 04/10/2022 1035   LABSPEC 1.020 04/10/2022 1035   PHURINE 6.0 04/10/2022 1035   GLUCOSEU NEGATIVE 04/10/2022 1035   HGBUR LARGE (A) 04/10/2022 1035   BILIRUBINUR SMALL (A) 04/10/2022 1035   KETONESUR NEGATIVE 04/10/2022 1035   PROTEINUR NEGATIVE 01/01/2016 1531   UROBILINOGEN 0.2 04/10/2022 1035   NITRITE NEGATIVE 04/10/2022 1035   LEUKOCYTESUR NEGATIVE 04/10/2022 1035    Radiological Exams on Admission: DG Chest Port 1 View  Result Date: 04/10/2022 CLINICAL DATA:  Short of breath, hypokalemia EXAM: PORTABLE CHEST 1 VIEW COMPARISON:  04/10/2022, 04/01/2022 FINDINGS: Single frontal view of the chest demonstrates a stable cardiac silhouette. Postsurgical changes from CABG. Continued consolidation at the right lung base, which obscures a known cavitary right lower lobe lesion seen on prior CT. Small right pleural effusion unchanged. No new airspace disease or pneumothorax. No acute bony abnormalities. IMPRESSION: 1. Stable right basilar consolidation and small right effusion, consistent with persistent pneumonia. These changes obscure an underlying cavitary neoplasm described on prior CT 02/22/2022. Electronically Signed  By: Randa Ngo M.D.   On: 04/10/2022 21:01    EKG: Independently reviewed.  Sinus tachycardia 101 bpm.  Mild baseline wander.  RSR prime in V2.  Assessment/Plan Principal Problem:   AKI (acute kidney injury) ([place]) Active Problems:   Hypertension  associated with diabetes ([place])   Hyperlipidemia LDL goal <70   Obesity (BMI 30-39.9)   Type 2 diabetes mellitus with vascular disease ([place])   PAD (peripheral artery disease) ([facility])   CAD (coronary artery disease)   Stenosis of cervical spine with myelopathy ([facility])   Stage III squamous cell carcinoma of right lung ([facility])   Normocytic anemia   Hypomagnesemia   Hypokalemia   AKI Hypokalemia Hypomagnesemia CHF? > Patient presenting with ongoing shortness of breath and edema with worsening creatinine and outpatient found to be 2.5 in ED and potassium of 3.2 as well as magnesium of 1.4. > Patient recently had echo last month with EF 55-60%, G1 DD, normal RV function. > Creatinine has been steadily worsening it was around 1 or less a month ago, had increased to 2 3 weeks ago, now 2.5.  Clear etiology with his elevated BNP of 575 outpatient and his edema there could be acute on chronic diastolic heart failure versus a primary intrarenal issue given lack of BUN elevation.  Possibly related to his lung cancer treatment. > Patient has received IV Lasix in the ED for suspected CHF exacerbation, will monitor response from this with plan to continue but will need to reassess based on morning labs.  If fails to respond may be reasonable to obtain nephrology consultation.  Outpatient urinalysis had bilirubin, hemoglobin, white cells, rare bacteria, granular and hyaline casts. > Patient has also received 60 mEq total potassium repletion and 1 g magnesium repletion. - Monitor on telemetry unit - Continue with Lasix for now - Strict I's and O's, daily weights - Trend renal function and electrolytes - No repeat echo as most recent was a month ago - Follow-up repeat urinalysis  Stage III lung cancer > Status post radiation therapy, status post carboplatin and paclitaxel. > Currently on immunotherapy with Imfinzi > Follows with Dr. Julien Nordmann  Hypothyroidism - Continue home Synthroid  Hypertension -  Continue home metoprolol  Diabetes - SSI  CAD PAD Hyperlipidemia - Continue home aspirin - Continue home rosuvastatin and fenofibrate - Continue home metoprolol  Anemia > Hemoglobin stable at 9.3 - Trend CBC  Gout > Currently receiving prednisone for gout flare - Continue prednisone  Obesity - Noted  Borderline glaucoma - Continue home eyedrops  DVT prophylaxis: Lovenox Code Status:   Full Family Communication:  Updated at bedside Disposition Plan:   Patient is from:  Home  Anticipated DC to:  Home  Anticipated DC date:  1 to 3 days  Anticipated DC barriers: None  Consults called:  None Admission status:  Observation, telemetry  Severity of Illness: The appropriate patient status for this patient is OBSERVATION. Observation status is judged to be reasonable and necessary in order to provide the required intensity of service to ensure the patient's safety. The patient's presenting symptoms, physical exam findings, and initial radiographic and laboratory data in the context of their medical condition is felt to place them at decreased risk for further clinical deterioration. Furthermore, it is anticipated that the patient will be medically stable for discharge from the hospital within 2 midnights of admission.    Marcelyn Bruins MD [facility]  How to contact the [facility] Attending or Consulting provider 7A -  7P or covering provider during after hours [place], for this patient?   Check the care team in [facility] and look for a) attending/consulting TR[facility] provider listed and b) the [facility] team listed Log into [email or web address] and use [place]'s universal password to access. If you do not have the password, please contact the hospital operator. Locate the [facility] provider you are looking for under [facility] and page to a number that you can be directly reached. If you still have difficulty reaching the provider, please page the [facility] (Director on Call) for the Hospitalists  listed on amion for assistance.  04/10/2022, 9:09 PM

## 2022-04-10 NOTE — ED Notes (Signed)
Per pt request, placed male external catheter on pt.

## 2022-04-10 NOTE — ED Triage Notes (Signed)
Pt arrived via POV, c/o low potassium and kidney function from blood work this morning. Also has had swelling to arms and legs.

## 2022-04-10 NOTE — ED Notes (Signed)
Urinal at bedside.  

## 2022-04-10 NOTE — ED Provider Triage Note (Signed)
Emergency Medicine Provider Triage Evaluation Note  Jorge Adams , a 71 y.o. male  was evaluated in triage.  Pt complains of allover swelling of his body for last 2 to 3 weeks.  States that is doctors that his kidney levels are getting worse.  Just finished chemotherapy not too long ago and is started on immunotherapy therapy for his lung cancer.  No history of blood clots.  States that left hand started swelling more yesterday and became more painful.  Sent in by doctor secondary to low potassium, and swelling.  Review of Systems  Positive: edema Negative: Decreased urine output  Physical Exam  BP (!) 144/93 (BP Location: Right Arm)   Pulse 90   Temp 98.8 F (37.1 C)   Resp 18   SpO2 96%  Gen:   Awake, no distress   Resp:  Normal effort  MSK:   Moves extremities without difficulty  Other:  2+ pitting edema of BLE, 2+pitting edema of LUE, 1+ pitting edema of RUE  Medical Decision Making  Medically screening exam initiated at 7:03 PM.  Appropriate orders placed.  Jorge Adams was informed that the remainder of the evaluation will be completed by another provider, this initial triage assessment does not replace that evaluation, and the importance of remaining in the ED until their evaluation is complete.    Osvaldo Shipper, [place] 04/10/22 1904

## 2022-04-10 NOTE — ED Provider Notes (Signed)
[facility] DEPT Provider Note   CSN: [id number] Arrival date & time: 04/10/22  1812     History  Chief Complaint  Patient presents with   Abnormal Lab    Jorge Adams is a 71 y.o. male history of lung cancer, CABG, diastolic heart failure, here presenting with shortness of breath and arm and leg swelling.  This been going on for at least a week or so.  Patient was diagnosed with hypothyroidism and recently started levothyroxine.  Patient states that the levothyroxine has not helped with symptoms.  Instead he got more short of breath and also arm and leg swelling.  He went to the clinic today and had labs drawn that showed BNP elevated to 500.  Patient also had potassium of 2.8.  Patient was sent here for IV potassium and diuresis and admission.  The history is provided by the patient.       Home Medications Prior to Admission medications   Medication Sig Start Date End Date Taking? Authorizing Provider  albuterol (VENTOLIN HFA) 108 (90 Base) MCG/ACT inhaler Inhale 1-2 puffs into the lungs every 6 (six) hours as needed (for cough.  okay to fill with albuterol/proair/ventolin.). Patient taking differently: Inhale 2 puffs into the lungs 3 (three) times daily as needed for shortness of breath. 11/06/21   [name], MD  aspirin EC 81 MG tablet Take 1 tablet (81 mg total) by mouth daily. Okay to restart this medication on 12/05/2021. 12/04/21   Collene Gobble, MD  blood glucose meter kit and supplies Dispense based on pt and insurance preference. Use up to 3 times daily as directed. (Dx. E11.9). 09/08/20   [name], MD  Blood Pressure Monitoring [facility]) MISC Use daily to check BP.  Dx I10 08/15/20   [name], MD  brimonidine (ALPHAGAN P) 0.1 % SOLN Place 2 drops into both eyes 2 (two) times daily.    [provider]  cilostazol (PLETAL) 100 MG tablet Take 1 tablet (100 mg total) by mouth 2 (two) times daily. Okay to  restart this medication on 12/05/2021. 12/04/21   Collene Gobble, MD  Cyanocobalamin (VITAMIN B12 PO) Take 1 tablet by mouth in the morning and at bedtime.    [provider]  dorzolamide-timolol (COSOPT) 22.3-6.8 MG/ML ophthalmic solution Place 1 drop into both eyes 2 (two) times daily.    [provider]  FeFum-FePoly-FA-B Cmp-C-Biot (INTEGRA PLUS) CAPS Take 1 capsule by mouth every morning. 03/06/22   Heilingoetter, [name] L, PA-C  fenofibrate (TRICOR) 145 MG tablet TAKE 1 TABLET BY MOUTH EVERY DAY IN THE EVENING 01/03/22   Adrian Prows, MD  HYDROcodone bit-homatropine (HYCODAN) 5-1.5 MG/5ML syrup Take 5 mLs by mouth every 8 (eight) hours as needed for cough (sedation caution.). 04/01/22   [name], MD  ipratropium-albuterol (DUONEB) 0.5-2.5 (3) MG/3ML SOLN Take 3 mLs by nebulization every 6 (six) hours as needed. 02/25/22   Oswald Hillock, MD  levothyroxine (SYNTHROID) 50 MCG tablet Take 1 tablet (50 mcg total) by mouth daily before breakfast. 03/27/22   Curt Bears, MD  metoprolol tartrate (LOPRESSOR) 25 MG tablet Take 0.5 tablets (12.5 mg total) by mouth 2 (two) times daily. Hold for SBP less than 110 02/25/22   Lama, Marge Duncans, MD  Netarsudil-Latanoprost (ROCKLATAN) 0.02-0.005 % SOLN Apply 1 drop to eye at bedtime. In both eyes    [provider]  nitroGLYCERIN (NITROSTAT) 0.4 MG SL tablet Place 1 tablet (0.4 mg  total) under the tongue every 5 (five) minutes as needed for chest pain. 11/06/21   [name], MD  omega-3 acid ethyl esters (LOVAZA) 1 g capsule TAKE 1 CAPSULE BY MOUTH TWICE A DAY 01/03/22   Adrian Prows, MD  potassium chloride SA (KLOR-CON M) 20 MEQ tablet Take 1 tablet (20 mEq total) by mouth 2 (two) times daily. 04/10/22   Ria Bush, MD  predniSONE (DELTASONE) 10 MG tablet Take 2 a day for 5 days, then 1 a day for 5 days, with food. Don't take with aleve/ibuprofen. 04/10/22   [name], MD  prochlorperazine (COMPAZINE) 10 MG tablet Take 1  tablet (10 mg total) by mouth every 6 (six) hours as needed for nausea or vomiting. Patient taking differently: Take 10 mg by mouth 2 (two) times daily as needed for nausea or vomiting. 02/04/22   Heilingoetter, [name] L, PA-C  rosuvastatin (CRESTOR) 20 MG tablet TAKE 1 TABLET BY MOUTH EVERY DAY 07/30/21   Adrian Prows, MD      Allergies    Metformin and related    Review of Systems   Review of Systems  Cardiovascular:  Positive for leg swelling.  All other systems reviewed and are negative.   Physical Exam Updated Vital Signs BP (!) 163/90   Pulse 96   Temp 98.8 F (37.1 C)   Resp (!) 25   SpO2 97%  Physical Exam Vitals and nursing note reviewed.  Constitutional:      Comments: Chronically ill-appearing  HENT:     Head: Normocephalic.     Nose: Nose normal.     Mouth/Throat:     Mouth: Mucous membranes are moist.  Eyes:     Extraocular Movements: Extraocular movements intact.     Pupils: Pupils are equal, round, and reactive to light.  Cardiovascular:     Rate and Rhythm: Normal rate and regular rhythm.     Pulses: Normal pulses.     Heart sounds: Normal heart sounds.  Pulmonary:     Comments: Crackles bilateral bases Abdominal:     General: Abdomen is flat.     Palpations: Abdomen is soft.  Musculoskeletal:     Cervical back: Normal range of motion and neck supple.     Comments: Patient has anasarca and 2+ edema bilateral arms and legs.  Skin:    General: Skin is warm.     Capillary Refill: Capillary refill takes less than 2 seconds.  Neurological:     General: No focal deficit present.     Mental Status: He is alert and oriented to person, place, and time.  Psychiatric:        Mood and Affect: Mood normal.        Behavior: Behavior normal.     ED Results / Procedures / Treatments   Labs (all labs ordered are listed, but only abnormal results are displayed) Labs Reviewed  COMPREHENSIVE METABOLIC PANEL - Abnormal; Notable for the following components:       Result Value   Potassium 3.2 (*)    Glucose, Bld 150 (*)    Creatinine, Ser 2.52 (*)    Calcium 7.1 (*)    Total Protein 5.2 (*)    Albumin 1.7 (*)    GFR, Estimated 26 (*)    All other components within normal limits  CBC WITH DIFFERENTIAL/PLATELET - Abnormal; Notable for the following components:   RBC 3.29 (*)    Hemoglobin 9.3 (*)    HCT 29.9 (*)  RDW 16.8 (*)    All other components within normal limits  MAGNESIUM - Abnormal; Notable for the following components:   Magnesium 1.4 (*)    All other components within normal limits  URINALYSIS, ROUTINE W REFLEX MICROSCOPIC  TROPONIN I (HIGH SENSITIVITY)    EKG None  Radiology No results found.  Procedures Procedures    Medications Ordered in ED Medications  magnesium sulfate IVPB 1 g 100 mL (has no administration in time range)  potassium chloride 10 mEq in 100 mL IVPB (has no administration in time range)  HYDROcodone-acetaminophen (NORCO/VICODIN) 5-325 MG per tablet 1 tablet (1 tablet Oral Given 04/10/22 2025)  potassium chloride SA (KLOR-CON M) CR tablet 40 mEq (40 mEq Oral Given 04/10/22 2026)  furosemide (LASIX) injection 40 mg (40 mg Intravenous Given 04/10/22 2019)    ED Course/ Medical Decision Making/ A&P                           Medical Decision Making NACHMAN SUNDT is a 70 y.o. male here presenting with anasarca.  Patient has elevated BNP in the clinic and also low potassium.  Patient has history of diastolic heart failure and also on immunotherapy for lung cancer.  Concern for worsening heart failure and kidney failure causing anasarca.  To get CBC and CMP and will give Lasix and replace potassium.  8:36 PM Repeat labs showed potassium of 3.2.  Magnesium is low.  Creatinine is 2.5 which is slightly elevated.  At this point, patient will be admitted for hypomagnesemia and heart failure.    Problems Addressed: Acute diastolic congestive heart failure ([place]): acute illness or injury Hypokalemia:  acute illness or injury Renal insufficiency: acute illness or injury  Amount and/or Complexity of Data Reviewed Radiology: ordered.  Risk Prescription drug management. Decision regarding hospitalization.    Final Clinical Impression(s) / ED Diagnoses Final diagnoses:  None    Rx / DC Orders ED Discharge Orders     None         Drenda Freeze, MD 04/10/22 2038

## 2022-04-10 NOTE — Patient Instructions (Addendum)
Use a warm damp cloth to try to remove some of the cotton from the scab.   Start with 2 tabs of prednisone a day.   If your pain is clearly better or if your sugar is >300, then cut back quicker and let me know.   I'll update your other docs.  Take care.  Glad to see you. Go to the lab on the way out.   If you have mychart we'll likely use that to update you.

## 2022-04-11 ENCOUNTER — Encounter (HOSPITAL_COMMUNITY): Payer: Self-pay | Admitting: Internal Medicine

## 2022-04-11 ENCOUNTER — Observation Stay (HOSPITAL_COMMUNITY): Payer: Medicare HMO

## 2022-04-11 DIAGNOSIS — Z951 Presence of aortocoronary bypass graft: Secondary | ICD-10-CM | POA: Diagnosis not present

## 2022-04-11 DIAGNOSIS — I5030 Unspecified diastolic (congestive) heart failure: Secondary | ICD-10-CM | POA: Diagnosis not present

## 2022-04-11 DIAGNOSIS — E1151 Type 2 diabetes mellitus with diabetic peripheral angiopathy without gangrene: Secondary | ICD-10-CM | POA: Diagnosis present

## 2022-04-11 DIAGNOSIS — Z452 Encounter for adjustment and management of vascular access device: Secondary | ICD-10-CM | POA: Diagnosis not present

## 2022-04-11 DIAGNOSIS — E669 Obesity, unspecified: Secondary | ICD-10-CM | POA: Diagnosis present

## 2022-04-11 DIAGNOSIS — R0602 Shortness of breath: Secondary | ICD-10-CM | POA: Diagnosis not present

## 2022-04-11 DIAGNOSIS — I252 Old myocardial infarction: Secondary | ICD-10-CM | POA: Diagnosis not present

## 2022-04-11 DIAGNOSIS — Z87891 Personal history of nicotine dependence: Secondary | ICD-10-CM | POA: Diagnosis not present

## 2022-04-11 DIAGNOSIS — C3490 Malignant neoplasm of unspecified part of unspecified bronchus or lung: Secondary | ICD-10-CM | POA: Diagnosis not present

## 2022-04-11 DIAGNOSIS — C3491 Malignant neoplasm of unspecified part of right bronchus or lung: Secondary | ICD-10-CM | POA: Diagnosis present

## 2022-04-11 DIAGNOSIS — Z6826 Body mass index (BMI) 26.0-26.9, adult: Secondary | ICD-10-CM | POA: Diagnosis not present

## 2022-04-11 DIAGNOSIS — Z79899 Other long term (current) drug therapy: Secondary | ICD-10-CM | POA: Diagnosis not present

## 2022-04-11 DIAGNOSIS — I2510 Atherosclerotic heart disease of native coronary artery without angina pectoris: Secondary | ICD-10-CM | POA: Diagnosis present

## 2022-04-11 DIAGNOSIS — N179 Acute kidney failure, unspecified: Secondary | ICD-10-CM | POA: Diagnosis present

## 2022-04-11 DIAGNOSIS — E1159 Type 2 diabetes mellitus with other circulatory complications: Secondary | ICD-10-CM | POA: Diagnosis not present

## 2022-04-11 DIAGNOSIS — Z7984 Long term (current) use of oral hypoglycemic drugs: Secondary | ICD-10-CM | POA: Diagnosis not present

## 2022-04-11 DIAGNOSIS — D649 Anemia, unspecified: Secondary | ICD-10-CM | POA: Diagnosis present

## 2022-04-11 DIAGNOSIS — E1169 Type 2 diabetes mellitus with other specified complication: Secondary | ICD-10-CM | POA: Diagnosis present

## 2022-04-11 DIAGNOSIS — E785 Hyperlipidemia, unspecified: Secondary | ICD-10-CM | POA: Diagnosis present

## 2022-04-11 DIAGNOSIS — T451X5A Adverse effect of antineoplastic and immunosuppressive drugs, initial encounter: Secondary | ICD-10-CM | POA: Diagnosis present

## 2022-04-11 DIAGNOSIS — H40009 Preglaucoma, unspecified, unspecified eye: Secondary | ICD-10-CM | POA: Diagnosis present

## 2022-04-11 DIAGNOSIS — E039 Hypothyroidism, unspecified: Secondary | ICD-10-CM | POA: Diagnosis present

## 2022-04-11 DIAGNOSIS — I739 Peripheral vascular disease, unspecified: Secondary | ICD-10-CM | POA: Diagnosis not present

## 2022-04-11 DIAGNOSIS — E876 Hypokalemia: Secondary | ICD-10-CM | POA: Diagnosis present

## 2022-04-11 DIAGNOSIS — I10 Essential (primary) hypertension: Secondary | ICD-10-CM | POA: Diagnosis not present

## 2022-04-11 DIAGNOSIS — I152 Hypertension secondary to endocrine disorders: Secondary | ICD-10-CM | POA: Diagnosis present

## 2022-04-11 DIAGNOSIS — E8779 Other fluid overload: Secondary | ICD-10-CM | POA: Diagnosis present

## 2022-04-11 DIAGNOSIS — E1121 Type 2 diabetes mellitus with diabetic nephropathy: Secondary | ICD-10-CM | POA: Diagnosis present

## 2022-04-11 DIAGNOSIS — Z7982 Long term (current) use of aspirin: Secondary | ICD-10-CM | POA: Diagnosis not present

## 2022-04-11 LAB — URINALYSIS, ROUTINE W REFLEX MICROSCOPIC
Bilirubin Urine: NEGATIVE
Glucose, UA: NEGATIVE mg/dL
Ketones, ur: NEGATIVE mg/dL
Leukocytes,Ua: NEGATIVE
Nitrite: NEGATIVE
Protein, ur: >=300 mg/dL — AB
RBC / HPF: >50 RBC/hpf — ABNORMAL HIGH (ref 0–5)
Specific Gravity, Urine: 1.011 (ref 1.005–1.030)
pH: 5 (ref 5.0–8.0)

## 2022-04-11 LAB — CBC
HCT: 22.5 % — ABNORMAL LOW (ref 39.0–52.0)
Hemoglobin: 7.1 g/dL — ABNORMAL LOW (ref 13.0–17.0)
MCH: 28.4 pg (ref 26.0–34.0)
MCHC: 31.6 g/dL (ref 30.0–36.0)
MCV: 90 fL (ref 80.0–100.0)
Platelets: 117 10*3/uL — ABNORMAL LOW (ref 150–400)
RBC: 2.5 MIL/uL — ABNORMAL LOW (ref 4.22–5.81)
RDW: 17 % — ABNORMAL HIGH (ref 11.5–15.5)
WBC: 3.6 10*3/uL — ABNORMAL LOW (ref 4.0–10.5)
nRBC: 0 % (ref 0.0–0.2)

## 2022-04-11 LAB — PREPARE RBC (CROSSMATCH)

## 2022-04-11 LAB — COMPREHENSIVE METABOLIC PANEL
ALT: 13 U/L (ref 0–44)
AST: 25 U/L (ref 15–41)
Albumin: <1.5 g/dL — ABNORMAL LOW (ref 3.5–5.0)
Alkaline Phosphatase: 35 U/L — ABNORMAL LOW (ref 38–126)
Anion gap: 6 (ref 5–15)
BUN: 18 mg/dL (ref 8–23)
CO2: 24 mmol/L (ref 22–32)
Calcium: 6.8 mg/dL — ABNORMAL LOW (ref 8.9–10.3)
Chloride: 109 mmol/L (ref 98–111)
Creatinine, Ser: 2.62 mg/dL — ABNORMAL HIGH (ref 0.61–1.24)
GFR, Estimated: 25 mL/min — ABNORMAL LOW (ref >60)
Glucose, Bld: 119 mg/dL — ABNORMAL HIGH (ref 70–99)
Potassium: 2.8 mmol/L — ABNORMAL LOW (ref 3.5–5.1)
Sodium: 139 mmol/L (ref 135–145)
Total Bilirubin: 0.4 mg/dL (ref 0.3–1.2)
Total Protein: 4 g/dL — ABNORMAL LOW (ref 6.5–8.1)

## 2022-04-11 LAB — PROTEIN / CREATININE RATIO, URINE
Creatinine, Urine: 83 mg/dL
Protein Creatinine Ratio: 8.39 mg/mg{Cre} — ABNORMAL HIGH (ref 0.00–0.15)
Total Protein, Urine: 696 mg/dL

## 2022-04-11 LAB — GLUCOSE, CAPILLARY
Glucose-Capillary: 92 mg/dL (ref 70–99)
Glucose-Capillary: 112 mg/dL — ABNORMAL HIGH (ref 70–99)
Glucose-Capillary: 181 mg/dL — ABNORMAL HIGH (ref 70–99)
Glucose-Capillary: 157 mg/dL — ABNORMAL HIGH (ref 70–99)

## 2022-04-11 LAB — CREATININE, URINE, RANDOM: Creatinine, Urine: 41 mg/dL

## 2022-04-11 LAB — SODIUM, URINE, RANDOM: Sodium, Ur: 85 mmol/L

## 2022-04-11 LAB — MAGNESIUM: Magnesium: 1.7 mg/dL (ref 1.7–2.4)

## 2022-04-11 MED ORDER — POTASSIUM CHLORIDE CRYS ER 20 MEQ PO TBCR
40.0000 meq | EXTENDED_RELEASE_TABLET | Freq: Two times a day (BID) | ORAL | Status: DC
  Administered 2022-04-11 – 2022-04-12 (×4): 40 meq via ORAL
  Filled 2022-04-11 (×4): qty 2

## 2022-04-11 MED ORDER — SODIUM CHLORIDE 0.9% IV SOLUTION: Freq: Once | INTRAVENOUS | Status: AC

## 2022-04-11 MED ORDER — ENOXAPARIN SODIUM 30 MG/0.3ML IJ SOSY: 30.0000 mg | PREFILLED_SYRINGE | INTRAMUSCULAR | Status: DC

## 2022-04-11 MED ORDER — HYDROCODONE-ACETAMINOPHEN 5-325 MG PO TABS
1.0000 | ORAL_TABLET | Freq: Once | ORAL | Status: AC
  Administered 2022-04-11: 1 via ORAL
  Filled 2022-04-11: qty 1

## 2022-04-11 MED ORDER — FUROSEMIDE 10 MG/ML IJ SOLN
80.0000 mg | Freq: Two times a day (BID) | INTRAMUSCULAR | Status: DC
  Administered 2022-04-11 – 2022-04-15 (×9): 80 mg via INTRAVENOUS
  Filled 2022-04-11 (×9): qty 8

## 2022-04-11 NOTE — Assessment & Plan Note (Addendum)
Dr. Earlie Server was added to the treatment team.  Currently finished one cycle Durvalumab. -Outpatient follow-up with oncology, Dr. Lorna Few.

## 2022-04-11 NOTE — Assessment & Plan Note (Addendum)
-   Patient maintained on home regimen fenofibrate, Crestor

## 2022-04-11 NOTE — Consult Note (Addendum)
Renal Service Consult Note [facility]  AKIVA BRASSFIELD 04/11/2022 Sol Blazing, MD Requesting Physician: Dr. Loleta Books, C.   Reason for Consult: Renal failure HPI: The patient is a 71 y.o. year-old w/ hx of CAD sp CABG, diast HF, hx lung cancer, DM2, HL, HTN, who presented to ED for SOB w/ swelling of arms and legs and poor kidney function from outpt labwork. In ED BP stable, RR in low 20s, creat 2.5, alb 1.7, Ca 7.1, protein 5.2.   Hb 9.3.  pt rec'd IV lasix and po KCl, 1 gm IV magnesium. Pt was admitted. We are asked to see for renal failure.   Pt seen in room, reports swelling in arms and legs. Started about 1 mo ago. He was on carboplatin/ paclitaxel from early June to late July. Then started immunoRx w/ the 1st week of Sept, sp 1 dose only so far.   Has stage IIIb NSCCa of the lung, he presented w/ a large RLL lung mass w/ satellite nodules and suspicious mediastinal LN dx'd in May 2023.   ROS - denies CP, no joint pain, no HA, no blurry vision, no rash, no diarrhea, no nausea/ vomiting, no dysuria, no difficulty voiding   Past Medical History  Past Medical History:  Diagnosis Date   Arthritis    ASHD (arteriosclerotic heart disease)    Atherosclerosis of native arteries of the extremities with intermittent claudication    Cataract    Coronary atherosclerosis of native coronary artery    Diabetes mellitus    NIDDM   Dyslipidemia    ED (erectile dysfunction)    Full dentures    Glaucoma associated with ocular disorder, mild stage    bilateral   Hyperlipidemia    Hypertension    under control, has been on med. > 9 yr.   Impotence of organic origin    Meniscus tear 05/2012   right knee   Myocardial infarction [facility]) 10/2002   Stented coronary artery    Past Surgical History  Past Surgical History:  Procedure Laterality Date   ANTERIOR CERVICAL DECOMP/DISCECTOMY FUSION N/A 05/07/2018   Procedure: ANTERIOR CERVICAL DECOMPRESSION/DISCECTOMY FUSION  CERVICAL FIVE - CERVICAL SIX;  Surgeon: Consuella Lose, MD;  Location: [place];  Service: Neurosurgery;  Laterality: N/A;  ANTERIOR CERVICAL DECOMPRESSION/DISCECTOMY FUSION CERVICAL FIVE - CERVICAL SIX   BRONCHIAL BIOPSY  12/04/2021   Procedure: BRONCHIAL BIOPSIES;  Surgeon: Collene Gobble, MD;  Location: [place];  Service: Cardiopulmonary;;   BRONCHIAL BRUSHINGS  12/04/2021   Procedure: BRONCHIAL BRUSHINGS;  Surgeon: Collene Gobble, MD;  Location: [place];  Service: Cardiopulmonary;;   BRONCHIAL NEEDLE ASPIRATION BIOPSY  12/04/2021   Procedure: BRONCHIAL NEEDLE ASPIRATION BIOPSIES;  Surgeon: Collene Gobble, MD;  Location: [place];  Service: Cardiopulmonary;;   CARDIAC CATHETERIZATION  10/23/2002   had 1 stent placed then-- by Dr. Melvern Banker   CARDIAC CATHETERIZATION N/A 12/21/2015   Procedure: Left Heart Cath and Coronary Angiography;  Surgeon: Adrian Prows, MD;  Location: [place] CV LAB;  Service: Cardiovascular;  Laterality: N/A;   CATARACT EXTRACTION Right 05/2017   CATARACT EXTRACTION Left 07/2017   COLONOSCOPY  10/08/2010   Dr.Patterson   CORONARY ARTERY BYPASS GRAFT N/A 01/02/2016   Procedure: CORONARY ARTERY BYPASS GRAFTING (CABG) TIMES 3 USING LEFT INTERNAL MAMMARY ARTERY AND RIGHT SAPHENOUS LEG VEIN HARVESTED ENDOSCOPICALLY;  Surgeon: Ivin Poot, MD;  Location: [place];  Service: Open Heart Surgery;  Laterality: N/A;   EYE SURGERY  INSERTION OF MESH N/A 05/06/2016   Procedure: INSERTION OF MESH;  Surgeon: Georganna Skeans, MD;  Location: [place];  Service: General;  Laterality: N/A;   KNEE ARTHROSCOPY WITH MEDIAL MENISECTOMY  06/04/2012   Procedure: KNEE ARTHROSCOPY WITH MEDIAL MENISECTOMY;  Surgeon: Ninetta Lights, MD;  Location: [place];  Service: Orthopedics;  Laterality: Right;  RIGHT SCOPE MEDIAL MENISCECTOMY, CHONDROPLASTY, EXCISION LOOSE BODY   LOWER EXTREMITY ANGIOGRAM N/A 11/23/2013   Procedure: LOWER EXTREMITY ANGIOGRAM;  Surgeon:  Laverda Page, MD;  Location: [facility] CATH LAB;  Service: Cardiovascular;  Laterality: N/A;   NECK SURGERY N/A    02/19/2018   PERIPHERAL VASCULAR CATHETERIZATION N/A 12/21/2015   Procedure: Abdominal Aortogram;  Surgeon: Adrian Prows, MD;  Location: [place] CV LAB;  Service: Cardiovascular;  Laterality: N/A;   PERIPHERAL VASCULAR CATHETERIZATION N/A 12/21/2015   Procedure: Abdominal Aortogram w/Lower Extremity;  Surgeon: Adrian Prows, MD;  Location: [place] CV LAB;  Service: Cardiovascular;  Laterality: N/A;   TEE WITHOUT CARDIOVERSION N/A 01/02/2016   Procedure: TRANSESOPHAGEAL ECHOCARDIOGRAM (TEE);  Surgeon: Ivin Poot, MD;  Location: [place];  Service: Open Heart Surgery;  Laterality: N/A;   UMBILICAL HERNIA REPAIR N/A 05/06/2016   Procedure: HERNIA REPAIR UMBILICAL ADULT;  Surgeon: Georganna Skeans, MD;  Location: [place];  Service: General;  Laterality: N/A;   VIDEO BRONCHOSCOPY N/A 12/04/2021   Procedure: VIDEO BRONCHOSCOPY WITH FLUORO;  Surgeon: Collene Gobble, MD;  Location: [place];  Service: Cardiopulmonary;  Laterality: N/A;   Family History  Family History  Problem Relation Age of Onset   Bone cancer Mother    Pneumonia Father    Diabetes Brother    Heart disease Brother        Pacemaker   COPD Brother    Colon cancer Neg Hx    Prostate cancer Neg Hx    Esophageal cancer Neg Hx    Stomach cancer Neg Hx    Social History  reports that he quit smoking about 19 years ago. His smoking use included cigarettes. He has a 87.50 pack-year smoking history. He has never used smokeless tobacco. He reports current alcohol use. He reports that he does not use drugs. Allergies  Allergies  Allergen Reactions   Metformin And Related     Held due to creatinine elevation September 2023.   Home medications Prior to Admission medications   Medication Sig Start Date End Date Taking? Authorizing Provider  albuterol (VENTOLIN HFA) 108 (90 Base) MCG/ACT inhaler Inhale 1-2 puffs into  the lungs every 6 (six) hours as needed (for cough.  okay to fill with albuterol/proair/ventolin.). Patient taking differently: Inhale 2 puffs into the lungs 3 (three) times daily as needed for shortness of breath. 11/06/21  Yes [name], MD  aspirin EC 81 MG tablet Take 1 tablet (81 mg total) by mouth daily. Okay to restart this medication on 12/05/2021. 12/04/21  Yes Collene Gobble, MD  brimonidine (ALPHAGAN P) 0.1 % SOLN Place 2 drops into both eyes 2 (two) times daily.   Yes [provider]  cilostazol (PLETAL) 100 MG tablet Take 1 tablet (100 mg total) by mouth 2 (two) times daily. Okay to restart this medication on 12/05/2021. 12/04/21  Yes Collene Gobble, MD  Cyanocobalamin (VITAMIN B12 PO) Take 1 tablet by mouth in the morning and at bedtime.   Yes [provider]  dorzolamide-timolol (COSOPT) 22.3-6.8 MG/ML ophthalmic solution Place 1 drop into both eyes 2 (two) times daily.  Yes [provider]  FeFum-FePoly-FA-B Cmp-C-Biot (INTEGRA PLUS) CAPS Take 1 capsule by mouth every morning. 03/06/22  Yes Heilingoetter, [name] L, PA-C  fenofibrate (TRICOR) 145 MG tablet TAKE 1 TABLET BY MOUTH EVERY DAY IN THE EVENING Patient taking differently: Take 145 mg by mouth daily. 01/03/22  Yes Adrian Prows, MD  HYDROcodone bit-homatropine (HYCODAN) 5-1.5 MG/5ML syrup Take 5 mLs by mouth every 8 (eight) hours as needed for cough (sedation caution.). 04/01/22  Yes [name], MD  levothyroxine (SYNTHROID) 50 MCG tablet Take 1 tablet (50 mcg total) by mouth daily before breakfast. 03/27/22  Yes Curt Bears, MD  metoprolol tartrate (LOPRESSOR) 25 MG tablet Take 0.5 tablets (12.5 mg total) by mouth 2 (two) times daily. Hold for SBP less than 110 Patient taking differently: Take 25 mg by mouth 2 (two) times daily. Hold for SBP less than 110 02/25/22  Yes Lama, Marge Duncans, MD  Netarsudil-Latanoprost (ROCKLATAN) 0.02-0.005 % SOLN Apply 1 drop to eye at bedtime. In both eyes   Yes  [provider]  nitroGLYCERIN (NITROSTAT) 0.4 MG SL tablet Place 1 tablet (0.4 mg total) under the tongue every 5 (five) minutes as needed for chest pain. 11/06/21  Yes [name], MD  omega-3 acid ethyl esters (LOVAZA) 1 g capsule TAKE 1 CAPSULE BY MOUTH TWICE A DAY Patient taking differently: Take 1 g by mouth 2 (two) times daily. 01/03/22  Yes Adrian Prows, MD  predniSONE (DELTASONE) 10 MG tablet Take 2 a day for 5 days, then 1 a day for 5 days, with food. Don't take with aleve/ibuprofen. 04/10/22  Yes [name], MD  prochlorperazine (COMPAZINE) 10 MG tablet Take 1 tablet (10 mg total) by mouth every 6 (six) hours as needed for nausea or vomiting. Patient taking differently: Take 10 mg by mouth 2 (two) times daily as needed for nausea or vomiting. 02/04/22  Yes Heilingoetter, [name] L, PA-C  rosuvastatin (CRESTOR) 20 MG tablet TAKE 1 TABLET BY MOUTH EVERY DAY Patient taking differently: Take 20 mg by mouth daily. 07/30/21  Yes Adrian Prows, MD  blood glucose meter kit and supplies Dispense based on pt and insurance preference. Use up to 3 times daily as directed. (Dx. E11.9). 09/08/20   [name], MD  Blood Pressure Monitoring [facility]) MISC Use daily to check BP.  Dx I10 08/15/20   [name], MD  ipratropium-albuterol (DUONEB) 0.5-2.5 (3) MG/3ML SOLN Take 3 mLs by nebulization every 6 (six) hours as needed. Patient not taking: Reported on 04/10/2022 02/25/22   Oswald Hillock, MD  potassium chloride SA (KLOR-CON M) 20 MEQ tablet Take 1 tablet (20 mEq total) by mouth 2 (two) times daily. 04/10/22   Ria Bush, MD     Vitals:   04/10/22 2216 04/11/22 0205 04/11/22 0601 04/11/22 1019  BP: (!) 147/75 125/77 131/80 129/79  Pulse: 88 77 79 82  Resp: 19 16 18 16   Temp: 98.7 F (37.1 C) 98.2 F (36.8 C) 98.3 F (36.8 C) 98 F (36.7 C)  TempSrc: Oral Oral Oral Oral  SpO2: 96% 96% 97% 97%   Exam Gen alert, no distress No rash, cyanosis or  gangrene Sclera anicteric, throat clear  No jvd or bruits Chest clear bilat to bases, no rales/ wheezing RRR no MRG Abd soft ntnd no mass or ascites +bs GU normal males MS no joint effusions or deformity Ext diffuse mild-mod pitting LE > UE edema, no wounds or ulcers Neuro is alert, Ox 3 , nf  Home meds include - albuterol, aspirin, fenofibrate, hydrocodone, levothyroxine, metoprolol 2.5 bid, rosuvastatin, kdur 20 bid, duoneb, prns/ vits/ supps     Date   Creat  eGFR    2013- 2022  0.5- 0.9    April- jul 2023 0.67- 0.97    Aug 4- 7, 2023 0.65- 0.97 > 60 ml/min    03/19/22  2.05  34    03/27/22  2.26    04/01/22  2.39     04/10/22  2.52  24 ml/min    9/21   2.62     UA 9/20 - hazy, large Hb, neg ketone/ LE/ nit, protein > 300, rare bact, >50 rbc, 6-10wbc    UP/C ratio 9/21 - 8.39    UCr 83      UNa - pend      CXR 9/20 - IMPRESSION: Stable right basilar consolidation and small right effusion, consistent with persistent pneumonia. These changes obscure an underlying cavitary neoplasm described on prior CT 02/22/2022.        CT chest/ abd/ pelv - 02/22/22, +contrast > IMPRESSION: No evidence of pulmonary embolism.  Interval cavitation of previously noted large right lower lobe mass with what appears to be evolving postobstructive and postradiation changes in the surrounding lung parenchyma, as above. 3. Aortic atherosclerosis, in addition to left main and three-vessel coronary artery disease. Status post median sternotomy for CABG including LIMA to the LAD.      BP's here normal in 110- 130/ 75-80 range, HR 82, RR 16, temp afeb 98.6     RA 97%    Urine sediment (undersigned, 9/21) - 25-50 rbc and 3-6 wbc per hpf, minimal cast formation, no convincing RBC casts  Assessment/ Plan: Renal failure - subacute AKI in pt w/ lung cancer dx'd in May 2023. His renal function was normal during chemotherapy (carbo/ paclitaxel) which was 7 weeks from early June to late July. Then in late August (8/29)  creat was noted to be up to 2.0. For his 1st immunotherapy (which was a week later on approx 9/07) per the family his arms were already swollen and they had a hard time finding a place for the IV. He is admitted now w/ creat up to 2.5 w/ evidence of nephrotic range proteinuria and microhematuria. Carboplatin can be nephrotoxic but usually causes ATN/ tubular injury and would not be expected to cause nephrotic syndrome. Darvulumab has been associated w/ neph syndrome, but his problems (renal failure, swelling) started before the darvulumab was given. For now plan will be to get urine to examine sediment and will send off serologies, get renal US, urine lytes. Will follow.  Volume - has edema of LE/ UE's, CXR w/o any edema. On RA at 97% spO2.  Lung cancer - stage IIIb, sp chemoradiation in June and July. SP immunoRx x 1 in early September ~ 9/7.  CAD h/o CABG HTN - on metoprolol at home HL      Rob Jonnie Finner  MD 04/11/2022, 1:50 PM Recent Labs  Lab 04/10/22 1950 04/11/22 0547  HGB 9.3* 7.1*  ALBUMIN 1.7* <1.5*  CALCIUM 7.1* 6.8*  CREATININE 2.52* 2.62*  K 3.2* 2.8*   Inpatient medications:  sodium chloride   Intravenous Once   aspirin EC  81 mg Oral Daily   brimonidine  1 drop Both Eyes BID   dorzolamide-timolol  1 drop Both Eyes BID   [START ON 04/12/2022] enoxaparin (LOVENOX) injection  30 mg Subcutaneous Q24H   fenofibrate  160 mg Oral Daily  furosemide  80 mg Intravenous BID   insulin aspart  0-9 Units Subcutaneous TID WC   levothyroxine  50 mcg Oral Q0600   metoprolol tartrate  12.5 mg Oral BID   Netarsudil-Latanoprost  1 drop Ophthalmic QHS   potassium chloride  40 mEq Oral BID   predniSONE  20 mg Oral Q breakfast   rosuvastatin  20 mg Oral Daily   sodium chloride flush  3 mL Intravenous Q12H    acetaminophen **OR** acetaminophen, ipratropium-albuterol, polyethylene glycol

## 2022-04-11 NOTE — Hospital Course (Addendum)
Jorge Adams is a 71 y.o. M with squamous lung CA T3N2M0, recently completed carboplatin/paclitaxel and radiation now on durvalumab this month, DM, HTN, HLD, and recently diagnosed hypothyroidism who presented with edema and elevated BNP.  Went to PCP few days ago for week or more of progressive SOB and edema.  PCP found AKI with Cr 2.3, BNP >300 and called and referred to the ER.  In the ER, CXR showed stable RLL opacity, maybe some superimposed edema.  Cr 2.5, troponin 21.      9/20: Admitted on Lasix 9/21: Cr worse, Nephrology and Cardiology consulted. Prednisone started 9/22: Port to be placed for improved access 9/24: Cr improving 9/26: Renal biopsy done

## 2022-04-11 NOTE — Progress Notes (Signed)
  Progress Note   Patient: Jorge Adams GNF:[id number] DOB: [birth year] DOA: 04/10/2022     0 DOS: the patient was seen and examined on 04/11/2022 at 11:10 AM      Brief hospital course: Jorge Adams is a 71 y.o. M with squamous lung CA T3N2M0, recently completed carboplatin/paclitaxel and radiation now on durvalumab this month, DM, HTN, HLD, and recently diagnosed hypothyroidism who presented with edema and elevated BNP.  Went to PCP few days ago for week or more of progressive SOB and edema.  PCP found AKI with Cr 2.3, BNP >300 and called and referred to the ER.  In the ER, CXR showed stable RLL opacity, maybe some superimposed edema.  Cr 2.5, troponin 21.      9/20: Admitted on Lasix 9/21: Cr worse, Nephrology and Cardiology consulted        Assessment and Plan: * AKI (acute kidney injury) ([place]) Cr timing increase is over weeks.  Associated with marked edema, proteinuria. Urine relatively acellular, has granular casts. - Check urine P/C ratio - Consult Nephrology - Continue Lasix - Transfuse 1 unit now     Hypokalemia - Supplement potassium  Hypomagnesemia - Supplement magnesium  Normocytic anemia Hemoglobin down to 7.1.  This is certainly not helping his renal function. Normocytic.  Ferritin and iron saturation good within the last month.  Suspect it is chemotherapy related. - Transfuse 1 unit PRBCs - Lasix  Stage III squamous cell carcinoma of right lung (HCC) Dr. Earlie Adams has been added to the treatment team.  Currently finished one cycle Durvalumab.  CAD (coronary artery disease) - Continue fenofibrate, metoprolol, Crestor - Hold aspirin for possible renal biopsy next week  PAD (peripheral artery disease) (HCC) - Hold aspirin - Continue fenofibrate and Crestor  Type 2 diabetes mellitus with vascular disease (HCC) Glucose controlled - Continue sliding scale corrections  Hyperlipidemia LDL goal <70 - Continue fenofibrate,  Crestor  Hypertension associated with diabetes ([place]) Blood pressure slightly elevated - Continue Lasix, metoprolol          Subjective: Adult male, lying in bed, appears weak and tired, no focal complaints, no rashes, appetite good.     Physical Exam: BP (!) 160/88   Pulse 84   Temp 98.6 F (37 C) (Oral)   Resp 18   SpO2 97%   Elderly adult male, sitting in bed, no acute distress RRR, no murmurs, 3+ pitting in the left arm, 2+ in the right arm, 2-3+ in both lower extremities, JVP normal Respiratory rate normal, lungs clear without rales or wheezes Abdomen soft without tenderness palpation or guarding, no ascites or distention Skin pale, no rashes or bruising or petechiae Attention normal, affect appropriate, judgment and insight appear normal    Data Reviewed: Discussed with cardiology and nephrology Basic metabolic panel shows slightly worse creatinine Potassium 2.8 Hemoglobin down to 7.1 Calcium corrected normal Albumin 1.5  Family Communication: Wife at the bedside    Disposition: Status is: Observation         Author: Edwin Dada, MD 04/11/2022 4:10 PM  For on call review [email or web address].

## 2022-04-11 NOTE — Assessment & Plan Note (Addendum)
Cr timing increase is over weeks.   Baseline Cr 0.7-0.9 Date     SCr 8/7:       0.72 8/29:     2.05 9/6:       2.26 (edema noted) 9/7: Started Imfinzi 9/19:     2.54 (on admission here)  Urine relatively acellular, has granular casts. P/C ratio >8, has marked edema, albumin <1.5 - Continue IV Lasix - Strict I/Os  - Consult Nephrology - Plan for biopsy next week - Follow SPEP, complements, glomerular basement membrane Abx, ANCA, ANA

## 2022-04-11 NOTE — Assessment & Plan Note (Addendum)
Blood pressure still slightly elevated - Continue Lasix, metoprolol

## 2022-04-11 NOTE — TOC Initial Note (Signed)
Transition of Care [facility]) - Initial/Assessment Note    Patient Details  Name: Jorge Adams MRN: [id number] Date of Birth: 1951-01-26  Transition of Care [facility]) CM/SW Contact:    Leeroy Cha, RN Phone Number: 04/11/2022, 7:50 AM  Clinical Narrative:                  Transition of Care [facility]) Screening Note   Patient Details  Name: Jorge Adams Date of Birth: Oct 15, 1950   Transition of Care [facility]) CM/SW Contact:    Leeroy Cha, RN Phone Number: 04/11/2022, 7:50 AM    Transition of Care Department [facility]) has reviewed patient and no TOC needs have been identified at this time. We will continue to monitor patient advancement through interdisciplinary progression rounds. If new patient transition needs arise, please place a TOC consult.    Expected Discharge Plan: Home/Self Care Barriers to Discharge: Continued Medical Work up   Patient Goals and CMS Choice Patient states their goals for this hospitalization and ongoing recovery are:: to go homne CMS Medicare.gov Compare Post Acute Care list provided to:: Patient    Expected Discharge Plan and Services Expected Discharge Plan: Home/Self Care   Discharge Planning Services: CM Consult   Living arrangements for the past 2 months: Single Family Home                                      Prior Living Arrangements/Services Living arrangements for the past 2 months: Single Family Home Lives with:: Spouse Patient language and need for interpreter reviewed:: Yes Do you feel safe going back to the place where you live?: Yes            Criminal Activity/Legal Involvement Pertinent to Current Situation/Hospitalization: No - Comment as needed  Activities of Daily Living Home Assistive Devices/Equipment: Walker (specify type) (seated walker) ADL Screening (condition at time of admission) Patient's cognitive ability adequate to safely complete daily activities?: Yes Is the patient deaf or have  difficulty hearing?: No Does the patient have difficulty seeing, even when wearing glasses/contacts?: No Does the patient have difficulty concentrating, remembering, or making decisions?: No Patient able to express need for assistance with ADLs?: Yes Does the patient have difficulty dressing or bathing?: Yes Independently performs ADLs?: Yes (appropriate for developmental age) Does the patient have difficulty walking or climbing stairs?: Yes Weakness of Legs: Both Weakness of Arms/Hands: Left (Lt weak, poss gout per wife at bedside)  Permission Sought/Granted                  Emotional Assessment Appearance:: Appears stated age   Affect (typically observed): Calm Orientation: : Oriented to Self, Oriented to Place, Oriented to  Time, Oriented to Situation Alcohol / Substance Use: Alcohol Use, Tobacco Use (quit smoking 19 years ago,current occas. etoh use) Psych Involvement: No (comment)  Admission diagnosis:  Hypokalemia [E87.6] Renal insufficiency [Q22.2] Acute diastolic congestive heart failure ([place]) [I50.31] AKI (acute kidney injury) ([place]) [N17.9] Patient Active Problem List   Diagnosis Date Noted   Creatinine elevation 04/10/2022   Hypomagnesemia 04/10/2022   Hypokalemia 04/10/2022   AKI (acute kidney injury) ([place]) 04/10/2022   Serum sickness due to drug 02/28/2022   Rash 02/26/2022   Pressure injury of skin 02/23/2022   Lobar pneumonia ([place]) 02/22/2022   Sepsis ([place]) 02/22/2022   Dysphagia 02/22/2022   Hyponatremia 02/22/2022   Normocytic anemia 02/22/2022   Stage III  squamous cell carcinoma of right lung ([place]) 12/07/2021   Goals of care, counseling/discussion 12/07/2021   Mass of lower lobe of right lung 11/09/2021   Cough 10/29/2021   Acute bronchitis 10/29/2021   Stenosis of cervical spine with myelopathy ([place]) 05/07/2018   Paresthesia 03/15/2018   Health care maintenance 09/04/2017   Snoring 52/58/9483   Umbilical hernia 47/58/3074   CAD (coronary artery  disease) 01/02/2016   Angina pectoris ([place]) 12/18/2015   Routine general medical examination at a health care facility 12/07/2014   Advance care planning 12/07/2014   PAD (peripheral artery disease) ([place]) 12/05/2013   Erectile dysfunction associated with type 2 diabetes mellitus ([place]) 10/31/2013   Type 2 diabetes mellitus with vascular disease ([place]) 10/31/2013   Senile nuclear sclerosis 06/04/2013   Borderline glaucoma with ocular hypertension 06/04/2013   Amblyopia, unspecified 06/04/2013   ASHD (arteriosclerotic heart disease) 01/28/2011   Hypogonadism, male 01/28/2011   Hypertension associated with diabetes ([place]) 01/28/2011   Hyperlipidemia LDL goal <70 01/28/2011   Obesity (BMI 30-39.9) 01/28/2011   PCP:  [name], MD Pharmacy:   [id number] [place], [place] [address] Phone: [phone] Fax: [phone]     Social Determinants of Health (SDOH) Interventions    Readmission Risk Interventions   No data to display

## 2022-04-11 NOTE — Assessment & Plan Note (Addendum)
-   Patient was maintained on home regimen fenofibrate, metoprolol, Crestor. - Aspirin held for biopsy and will be resumed on discharge.

## 2022-04-11 NOTE — Assessment & Plan Note (Addendum)
Supplemented

## 2022-04-11 NOTE — Assessment & Plan Note (Addendum)
-   Aspirin held during the hospitalization, patient maintained on home regimen fenofibrate and Crestor.   -Aspirin 81 mg will be resumed on discharge.  -Outpatient follow-up with PCP.

## 2022-04-11 NOTE — Assessment & Plan Note (Addendum)
Glucose controlled - Patient maintained on sliding scale insulin during the hospitalization.

## 2022-04-11 NOTE — Assessment & Plan Note (Addendum)
7.1 on admission, trasnsfused 1 unit 9/21 Up to 9.1 today. Normocytic.  Ferritin and iron saturation good within the last month.  Suspect it is chemotherapy related.

## 2022-04-11 NOTE — Progress Notes (Signed)
Chaplain engaged in an initial visit with Jorge Adams, his wife, and sister in Sports coach.  Chaplain learned that Jorge Adams and [name] have been married about 12 years and met through a reunion at school.  They were both on the planning committee for the school reunion.  [name] voiced her gratefulness and love for [name].  She has been able to take care of him through her own healthcare battle and thanks God for allowing her the strength to do so.  Both Jorge Adams and his wife are faithful believers.  They have a supportive faith community.  Chaplain offered prayer, support, and listening with them.     04/11/22 1100  Clinical Encounter Type  Visited With Patient and family together  Visit Type Spiritual support  Referral From Patient  Consult/Referral To Chaplain  Spiritual Encounters  Spiritual Needs Prayer

## 2022-04-11 NOTE — Assessment & Plan Note (Addendum)
Slightly better - Continue K

## 2022-04-11 NOTE — Progress Notes (Signed)
Chief Complaint: Patient was seen in consultation today for port placement request   Referring Physician(s): Dr. Loleta Books Dr. Julien Nordmann  Supervising Physician: Arne Cleveland  Patient Status: [facility] - In-pt  History of Present Illness: Jorge Adams is a 71 y.o. male being treated for lung cancer. Has been receiving treatment via PIV but increasingly more difficult. Was scheduled for port placement as an outpt next week but has now been admitted with AKI and probable mild volume overload. Has received IV lasix and is doing some better. IV status remains tenuous. IR is asked to proceed with port placement while he is here to improve access as well as save a return visit. PMHx, meds, labs, imaging, allergies reviewed. Feels better since admission. Family at bedside.   Past Medical History:  Diagnosis Date   Arthritis    ASHD (arteriosclerotic heart disease)    Atherosclerosis of native arteries of the extremities with intermittent claudication    Cataract    Coronary atherosclerosis of native coronary artery    Diabetes mellitus    NIDDM   Dyslipidemia    ED (erectile dysfunction)    Full dentures    Glaucoma associated with ocular disorder, mild stage    bilateral   Hyperlipidemia    Hypertension    under control, has been on med. > 9 yr.   Impotence of organic origin    Meniscus tear 05/2012   right knee   Myocardial infarction [facility]) 10/2002   Stented coronary artery     Past Surgical History:  Procedure Laterality Date   ANTERIOR CERVICAL DECOMP/DISCECTOMY FUSION N/A 05/07/2018   Procedure: ANTERIOR CERVICAL DECOMPRESSION/DISCECTOMY FUSION CERVICAL FIVE - CERVICAL SIX;  Surgeon: Consuella Lose, MD;  Location: [place];  Service: Neurosurgery;  Laterality: N/A;  ANTERIOR CERVICAL DECOMPRESSION/DISCECTOMY FUSION CERVICAL FIVE - CERVICAL SIX   BRONCHIAL BIOPSY  12/04/2021   Procedure: BRONCHIAL BIOPSIES;  Surgeon: Collene Gobble, MD;  Location: [place];   Service: Cardiopulmonary;;   BRONCHIAL BRUSHINGS  12/04/2021   Procedure: BRONCHIAL BRUSHINGS;  Surgeon: Collene Gobble, MD;  Location: [place];  Service: Cardiopulmonary;;   BRONCHIAL NEEDLE ASPIRATION BIOPSY  12/04/2021   Procedure: BRONCHIAL NEEDLE ASPIRATION BIOPSIES;  Surgeon: Collene Gobble, MD;  Location: [place];  Service: Cardiopulmonary;;   CARDIAC CATHETERIZATION  10/23/2002   had 1 stent placed then-- by Dr. Melvern Banker   CARDIAC CATHETERIZATION N/A 12/21/2015   Procedure: Left Heart Cath and Coronary Angiography;  Surgeon: Adrian Prows, MD;  Location: [place] CV LAB;  Service: Cardiovascular;  Laterality: N/A;   CATARACT EXTRACTION Right 05/2017   CATARACT EXTRACTION Left 07/2017   COLONOSCOPY  10/08/2010   Dr.Patterson   CORONARY ARTERY BYPASS GRAFT N/A 01/02/2016   Procedure: CORONARY ARTERY BYPASS GRAFTING (CABG) TIMES 3 USING LEFT INTERNAL MAMMARY ARTERY AND RIGHT SAPHENOUS LEG VEIN HARVESTED ENDOSCOPICALLY;  Surgeon: Ivin Poot, MD;  Location: [place];  Service: Open Heart Surgery;  Laterality: N/A;   EYE SURGERY     INSERTION OF MESH N/A 05/06/2016   Procedure: INSERTION OF MESH;  Surgeon: Georganna Skeans, MD;  Location: [place];  Service: General;  Laterality: N/A;   KNEE ARTHROSCOPY WITH MEDIAL MENISECTOMY  06/04/2012   Procedure: KNEE ARTHROSCOPY WITH MEDIAL MENISECTOMY;  Surgeon: Ninetta Lights, MD;  Location: [place];  Service: Orthopedics;  Laterality: Right;  RIGHT SCOPE MEDIAL MENISCECTOMY, CHONDROPLASTY, EXCISION LOOSE BODY   LOWER EXTREMITY ANGIOGRAM N/A 11/23/2013   Procedure: LOWER EXTREMITY ANGIOGRAM;  Surgeon: Cammy Brochure  Carlynn Herald, MD;  Location: [place] CATH LAB;  Service: Cardiovascular;  Laterality: N/A;   NECK SURGERY N/A    02/19/2018   PERIPHERAL VASCULAR CATHETERIZATION N/A 12/21/2015   Procedure: Abdominal Aortogram;  Surgeon: Adrian Prows, MD;  Location: [place] CV LAB;  Service: Cardiovascular;  Laterality: N/A;   PERIPHERAL  VASCULAR CATHETERIZATION N/A 12/21/2015   Procedure: Abdominal Aortogram w/Lower Extremity;  Surgeon: Adrian Prows, MD;  Location: [place] CV LAB;  Service: Cardiovascular;  Laterality: N/A;   TEE WITHOUT CARDIOVERSION N/A 01/02/2016   Procedure: TRANSESOPHAGEAL ECHOCARDIOGRAM (TEE);  Surgeon: Ivin Poot, MD;  Location: [place];  Service: Open Heart Surgery;  Laterality: N/A;   UMBILICAL HERNIA REPAIR N/A 05/06/2016   Procedure: HERNIA REPAIR UMBILICAL ADULT;  Surgeon: Georganna Skeans, MD;  Location: [place];  Service: General;  Laterality: N/A;   VIDEO BRONCHOSCOPY N/A 12/04/2021   Procedure: VIDEO BRONCHOSCOPY WITH FLUORO;  Surgeon: Collene Gobble, MD;  Location: [place];  Service: Cardiopulmonary;  Laterality: N/A;    Allergies: Metformin and related  Medications:  Current Facility-Administered Medications:    0.9 %  sodium chloride infusion (Manually program via Guardrails IV Fluids), , Intravenous, Once, Danford, Suann Larry, MD   acetaminophen (TYLENOL) tablet 650 mg, 650 mg, Oral, Q6H PRN **OR** acetaminophen (TYLENOL) suppository 650 mg, 650 mg, Rectal, Q6H PRN, Marcelyn Bruins, MD   aspirin EC tablet 81 mg, 81 mg, Oral, Daily, Marcelyn Bruins, MD, 81 mg at 04/11/22 0956   brimonidine (ALPHAGAN) 0.15 % ophthalmic solution 1 drop, 1 drop, Both Eyes, BID, Marcelyn Bruins, MD, 1 drop at 04/11/22 0959   dorzolamide-timolol (COSOPT) 22.3-6.8 MG/ML ophthalmic solution 1 drop, 1 drop, Both Eyes, BID, Marcelyn Bruins, MD, 1 drop at 04/11/22 0957   [START ON 04/12/2022] enoxaparin (LOVENOX) injection 30 mg, 30 mg, Subcutaneous, Q24H, Shyvonne Chastang, PA-C   fenofibrate tablet 160 mg, 160 mg, Oral, Daily, Marcelyn Bruins, MD, 160 mg at 04/11/22 0954   furosemide (LASIX) injection 80 mg, 80 mg, Intravenous, BID, Danford, Suann Larry, MD   insulin aspart (novoLOG) injection 0-9 Units, 0-9 Units, Subcutaneous, TID WC, Marcelyn Bruins, MD   ipratropium-albuterol  (DUONEB) 0.5-2.5 (3) MG/3ML nebulizer solution 3 mL, 3 mL, Nebulization, Q6H PRN, Marcelyn Bruins, MD   levothyroxine (SYNTHROID) tablet 50 mcg, 50 mcg, Oral, Q0600, Marcelyn Bruins, MD, 50 mcg at 04/11/22 0531   metoprolol tartrate (LOPRESSOR) tablet 12.5 mg, 12.5 mg, Oral, BID, Marcelyn Bruins, MD, 12.5 mg at 04/11/22 0956   Netarsudil-Latanoprost 0.02-0.005 % SOLN 1 drop, 1 drop, Ophthalmic, QHS, Marcelyn Bruins, MD   polyethylene glycol (MIRALAX / GLYCOLAX) packet 17 g, 17 g, Oral, Daily PRN, Marcelyn Bruins, MD   potassium chloride SA (KLOR-CON M) CR tablet 40 mEq, 40 mEq, Oral, BID, Danford, Suann Larry, MD, 40 mEq at 04/11/22 1251   predniSONE (DELTASONE) tablet 20 mg, 20 mg, Oral, Q breakfast, Marcelyn Bruins, MD, 20 mg at 04/11/22 0840   rosuvastatin (CRESTOR) tablet 20 mg, 20 mg, Oral, Daily, Marcelyn Bruins, MD, 20 mg at 04/11/22 1660   sodium chloride flush (NS) 0.9 % injection 3 mL, 3 mL, Intravenous, Q12H, Marcelyn Bruins, MD    Family History  Problem Relation Age of Onset   Bone cancer Mother    Pneumonia Father    Diabetes Brother    Heart disease Brother        Pacemaker   COPD Brother    Colon  cancer Neg Hx    Prostate cancer Neg Hx    Esophageal cancer Neg Hx    Stomach cancer Neg Hx     Social History   Socioeconomic History   Marital status: Married    Spouse name: Not on file   Number of children: 2   Years of education: Not on file   Highest education level: Not on file  Occupational History   Occupation: retired  Tobacco Use   Smoking status: Former    Packs/day: 2.50    Years: 35.00    Total pack years: 87.50    Types: Cigarettes    Quit date: 12/02/2002    Years since quitting: 19.3   Smokeless tobacco: Never  Vaping Use   Vaping Use: Never used  Substance and Sexual Activity   Alcohol use: Yes    Comment: rare   Drug use: No   Sexual activity: Not on file  Other Topics Concern   Not on file  Social  History Narrative   Retired Librarian, 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    Doristine Bosworth, [employer] fan   Social Determinants of Health   Financial Resource Strain: Low Risk  (01/31/2022)   Overall Financial Resource Strain (CARDIA)    Difficulty of Paying Living Expenses: Not hard at all  Food Insecurity: No Food Insecurity (04/10/2022)   Hunger Vital Sign    Worried About Running Out of Food in the Last Year: Never true    Ran Out of Food in the Last Year: Never true  Transportation Needs: No Transportation Needs (04/10/2022)   PRAPARE - Hydrologist (Medical): No    Lack of Transportation (Non-Medical): No  Physical Activity: Unknown (01/31/2022)   Exercise Vital Sign    Days of Exercise per Week: Patient refused    Minutes of Exercise per Session: Patient refused  Stress: Stress Concern Present (01/31/2022)   [place]    Feeling of Stress : To some extent  Social Connections: Moderately Integrated (01/31/2022)   Social Connection and Isolation Panel [NHANES]    Frequency of Communication with Friends and Family: More than three times a week    Frequency of Social Gatherings with Friends and Family: Never    Attends Religious Services: More than 4 times per year    Active Member of [employer] or Organizations: No    Attends Archivist Meetings: Never    Marital Status: Married    Review of Systems: A 12 point ROS discussed and pertinent positives are indicated in the HPI above.  All other systems are negative.  Review of Systems  Vital Signs: BP 129/79 (BP Location: Right Arm)   Pulse 82   Temp 98 F (36.7 C) (Oral)   Resp 16   SpO2 97%   Physical Exam Constitutional:      General: He is not in acute distress.    Appearance: Normal appearance. He is not ill-appearing.  HENT:      Mouth/Throat:     Mouth: Mucous membranes are moist.     Pharynx: Oropharynx is clear.  Cardiovascular:     Rate and Rhythm: Normal rate and regular rhythm.     Heart sounds: Normal heart sounds.  Pulmonary:     Effort: Pulmonary effort is normal. No respiratory distress.     Breath sounds:  Normal breath sounds.  Neurological:     General: No focal deficit present.     Mental Status: He is alert and oriented to person, place, and time.  Psychiatric:        Mood and Affect: Mood normal.        Thought Content: Thought content normal.     Imaging: DG Chest Port 1 View  Result Date: 04/10/2022 CLINICAL DATA:  Short of breath, hypokalemia EXAM: PORTABLE CHEST 1 VIEW COMPARISON:  04/10/2022, 04/01/2022 FINDINGS: Single frontal view of the chest demonstrates a stable cardiac silhouette. Postsurgical changes from CABG. Continued consolidation at the right lung base, which obscures a known cavitary right lower lobe lesion seen on prior CT. Small right pleural effusion unchanged. No new airspace disease or pneumothorax. No acute bony abnormalities. IMPRESSION: 1. Stable right basilar consolidation and small right effusion, consistent with persistent pneumonia. These changes obscure an underlying cavitary neoplasm described on prior CT 02/22/2022. Electronically Signed   By: Randa Ngo M.D.   On: 04/10/2022 21:01   DG Chest 2 View  Result Date: 04/01/2022 CLINICAL DATA:  Shortness of breath, cough and lung cancer EXAM: CHEST - 2 VIEW COMPARISON:  02/22/2022 FINDINGS: Previous median sternotomy for coronary bypass. Stable heart size and vascularity. Known cavitary right lower lobe mass is obscured to a similar degree by surrounding right basilar airspace opacities. Compared to the prior study, there is slight increased streaky opacities in both lower lobes concerning for developing superimposed bibasilar pneumonia. Small right effusion noted. No pneumothorax. Trachea midline. Aorta atherosclerotic.  IMPRESSION: Increased streaky bibasilar opacities concerning for superimposed pneumonia. Known right lower lobe cavitary mass is obscured by surrounding right base airspace disease. Electronically Signed   By: Jerilynn Mages.  Shick M.D.   On: 04/01/2022 16:58    Labs:  CBC: Recent Labs    03/27/22 1427 04/10/22 1035 04/10/22 1950 04/11/22 0547  WBC 3.5* 5.1 5.5 3.6*  HGB 8.5* 9.4* 9.3* 7.1*  HCT 26.1* 28.1* 29.9* 22.5*  PLT 142* 161.0 161 117*    COAGS: Recent Labs    02/12/22 0324 02/23/22 0033  INR 1.6* 1.7*  APTT 38*  --     BMP: Recent Labs    03/19/22 0800 03/27/22 1427 04/01/22 1548 04/10/22 1035 04/10/22 1950 04/11/22 0547  NA 139 139 139 140 138 139  K 3.9 3.2* 3.6 2.8* 3.2* 2.8*  CL 109 111 106 104 106 109  CO2 26 23 25 26 23 24   GLUCOSE 94 93 133* 104* 150* 119*  BUN 34* 23 19 15 17 18   CALCIUM 7.8* 7.5* 7.1* 7.0* 7.1* 6.8*  CREATININE 2.05* 2.26* 2.39* 2.54* 2.52* 2.62*  GFRNONAA 34* 30*  --   --  26* 25*    LIVER FUNCTION TESTS: Recent Labs    04/01/22 1548 04/10/22 1035 04/10/22 1950 04/11/22 0547  BILITOT 0.4 0.5 0.7 0.4  AST 26 26 35 25  ALT 13 13 17 13   ALKPHOS 41 46 48 35*  PROT 4.9* 5.0* 5.2* 4.0*  ALBUMIN 2.0* 2.0* 1.7* <1.5*     Assessment and Plan: Lung cancer, ongoing treatments Poor venous access. Plan for port placement tomorrow. Labs reviewed. Risks and benefits of image guided port-a-catheter placement was discussed with the patient including, but not limited to bleeding, infection, pneumothorax, or fibrin sheath development and need for additional procedures.  All of the patient's questions were answered, patient is agreeable to proceed. Consent signed and in chart.    Electronically Signed: Ascencion Dike, PA-C 04/11/2022,  1:51 PM   I spent a total of 20 minutes in face to face in clinical consultation, greater than 50% of which was counseling/coordinating care for port placement

## 2022-04-12 ENCOUNTER — Inpatient Hospital Stay (HOSPITAL_COMMUNITY): Payer: Medicare HMO

## 2022-04-12 DIAGNOSIS — N179 Acute kidney failure, unspecified: Secondary | ICD-10-CM | POA: Diagnosis not present

## 2022-04-12 HISTORY — PX: IR IMAGING GUIDED PORT INSERTION: IMG5740

## 2022-04-12 LAB — CBC
HCT: 28.5 % — ABNORMAL LOW (ref 39.0–52.0)
Hemoglobin: 9.1 g/dL — ABNORMAL LOW (ref 13.0–17.0)
MCH: 28.6 pg (ref 26.0–34.0)
MCHC: 31.9 g/dL (ref 30.0–36.0)
MCV: 89.6 fL (ref 80.0–100.0)
Platelets: 137 10*3/uL — ABNORMAL LOW (ref 150–400)
RBC: 3.18 MIL/uL — ABNORMAL LOW (ref 4.22–5.81)
RDW: 16.4 % — ABNORMAL HIGH (ref 11.5–15.5)
WBC: 3.9 10*3/uL — ABNORMAL LOW (ref 4.0–10.5)
nRBC: 0 % (ref 0.0–0.2)

## 2022-04-12 LAB — TYPE AND SCREEN
ABO/RH(D): O POS
Antibody Screen: NEGATIVE
Unit division: 0

## 2022-04-12 LAB — BPAM RBC
Blood Product Expiration Date: 202310242359
ISSUE DATE / TIME: 202309211516
Unit Type and Rh: 5100

## 2022-04-12 LAB — BASIC METABOLIC PANEL
Anion gap: 6 (ref 5–15)
BUN: 20 mg/dL (ref 8–23)
CO2: 26 mmol/L (ref 22–32)
Calcium: 7.3 mg/dL — ABNORMAL LOW (ref 8.9–10.3)
Chloride: 109 mmol/L (ref 98–111)
Creatinine, Ser: 2.59 mg/dL — ABNORMAL HIGH (ref 0.61–1.24)
GFR, Estimated: 26 mL/min — ABNORMAL LOW (ref >60)
Glucose, Bld: 89 mg/dL (ref 70–99)
Potassium: 3.1 mmol/L — ABNORMAL LOW (ref 3.5–5.1)
Sodium: 141 mmol/L (ref 135–145)

## 2022-04-12 LAB — CREATININE CLEARANCE, URINE, 24 HOUR
Collection Interval-CRCL: 24 hours
Creatinine Clearance: 14 mL/min — ABNORMAL LOW (ref 75–125)
Creatinine, 24H Ur: 540 mg/d — ABNORMAL LOW (ref 800–2000)
Creatinine, Urine: 24 mg/dL
Urine Total Volume-CRCL: 2250 mL

## 2022-04-12 LAB — SURGICAL PCR SCREEN
MRSA, PCR: NEGATIVE
Staphylococcus aureus: NEGATIVE

## 2022-04-12 LAB — GLUCOSE, CAPILLARY
Glucose-Capillary: 86 mg/dL (ref 70–99)
Glucose-Capillary: 105 mg/dL — ABNORMAL HIGH (ref 70–99)
Glucose-Capillary: 93 mg/dL (ref 70–99)
Glucose-Capillary: 277 mg/dL — ABNORMAL HIGH (ref 70–99)

## 2022-04-12 MED ORDER — FENTANYL CITRATE (PF) 100 MCG/2ML IJ SOLN
INTRAMUSCULAR | Status: AC
  Filled 2022-04-12: qty 2

## 2022-04-12 MED ORDER — MIDAZOLAM HCL 2 MG/2ML IJ SOLN
INTRAMUSCULAR | Status: AC
  Filled 2022-04-12: qty 2

## 2022-04-12 MED ORDER — LIDOCAINE-EPINEPHRINE 1 %-1:100000 IJ SOLN
INTRAMUSCULAR | Status: AC | PRN
  Administered 2022-04-12: 10 mL via INTRADERMAL

## 2022-04-12 MED ORDER — MIDAZOLAM HCL 2 MG/2ML IJ SOLN
INTRAMUSCULAR | Status: AC | PRN
  Administered 2022-04-12: 1 mg via INTRAVENOUS

## 2022-04-12 MED ORDER — MUPIROCIN 2 % EX OINT
1.0000 | TOPICAL_OINTMENT | Freq: Two times a day (BID) | CUTANEOUS | Status: AC
  Administered 2022-04-12 – 2022-04-16 (×9): 1 via NASAL
  Filled 2022-04-12 (×2): qty 22

## 2022-04-12 MED ORDER — FENTANYL CITRATE (PF) 100 MCG/2ML IJ SOLN
INTRAMUSCULAR | Status: AC | PRN
  Administered 2022-04-12: 50 ug via INTRAVENOUS

## 2022-04-12 MED ORDER — LIDOCAINE-EPINEPHRINE 1 %-1:100000 IJ SOLN
INTRAMUSCULAR | Status: AC
  Filled 2022-04-12: qty 1

## 2022-04-12 NOTE — Progress Notes (Signed)
PT Cancellation Note  Patient Details Name: Jorge Adams MRN: [id number] DOB: 03/01/1951   Cancelled Treatment:    Reason Eval/Treat Not Completed: Patient declined, no reason specified Pt reports he was already up today and is awaiting port placement.     Myrtis Hopping [place] 04/12/2022, 2:52 PM Jannette Spanner PT, DPT Physical Therapist Acute Rehabilitation Services Preferred contact method: Secure Chat Weekend Pager Only: [phone] Office: [phone]

## 2022-04-12 NOTE — Procedures (Signed)
  Procedure:  R IJ Port catheter placement   Preprocedure diagnosis: The primary encounter diagnosis was Hypokalemia. Diagnoses of Renal insufficiency and Acute diastolic congestive heart failure ([place]) were also pertinent to this visit.  Postprocedure diagnosis: same EBL:    minimal Complications:   none immediate  See full dictation in [employer].  Dillard Cannon MD Main # [phone] Pager  [phone] Mobile [phone]

## 2022-04-12 NOTE — Progress Notes (Signed)
  Progress Note   Patient: Jorge Adams CXK:[id number] DOB: [birth year] DOA: 04/10/2022     1 DOS: the patient was seen and examined on 04/12/2022 at 9:00AM      Brief hospital course: Mr. Antkowiak is a 71 y.o. M with squamous lung CA T3N2M0, recently completed carboplatin/paclitaxel and radiation now on durvalumab this month, DM, HTN, HLD, and recently diagnosed hypothyroidism who presented with edema and elevated BNP.  Went to PCP few days ago for week or more of progressive SOB and edema.  PCP found AKI with Cr 2.3, BNP >300 and called and referred to the ER.  In the ER, CXR showed stable RLL opacity, maybe some superimposed edema.  Cr 2.5, troponin 21.      9/20: Admitted on Lasix 9/21: Cr worse, Nephrology and Cardiology consulted 9/22: Port to be placed for improved access       Assessment and Plan: * AKI (acute kidney injury) ([place]) Cr timing increase is over weeks.   Baseline Cr 0.7-0.9 Date     SCr 8/7:       0.72 8/29:     2.05 9/6:       2.26 (edema noted) 9/7: Started Imfinzi 9/19:     2.54 (on admission here)  Urine relatively acellular, has granular casts. P/C ratio >8, has marked edema, albumin <1.5 - Consult Nephrology - Continue Lasix - Strict I/Os  - Follow SPEP, complements, glomerular basement membrane Abx, ANCA, ANA     Hypokalemia Slightly better - Continue K  Hypomagnesemia - Check Mag  Normocytic anemia 7.1 on admission, trasnsfused 1 unit 9/21 Up to 9.1 today. Normocytic.  Ferritin and iron saturation good within the last month.  Suspect it is chemotherapy related.    Stage III squamous cell carcinoma of right lung (HCC) Dr. Earlie Server has been added to the treatment team.  Currently finished one cycle Durvalumab.  CAD (coronary artery disease) - Continue fenofibrate, metoprolol, Crestor - Hold aspirin for possible renal biopsy next week  PAD (peripheral artery disease) (HCC) - Hold aspirin - Continue fenofibrate and  Crestor  Type 2 diabetes mellitus with vascular disease (HCC) Glucose controlled - Continue sliding scale corrections  Hyperlipidemia LDL goal <70 - Continue fenofibrate, Crestor  Hypertension associated with diabetes ([place]) Blood pressure still slightly elevated - Continue Lasix, metoprolol          Subjective: Patient feels like his swelling is slightly improved, has had no dyspnea, no orthopnea, no confusion, no cough, no fever.     Physical Exam: BP (!) 143/90 (BP Location: Right Arm)   Pulse 77   Temp (!) 97.5 F (36.4 C) (Oral)   Resp 16   SpO2 100%   Adult male, lying in bed, appears weak and tired RRR, systolic murmur noted, 2+ pitting edema in the arms and legs, JVP seems normal Respiratory rate normal, lung sounds diminished overall but no rales or wheezing Abdomen soft without tenderness palpation or guarding, no ascites or distention Attention normal, affect blunted, judgment and insight appear normal Skin overall pale, no bruising     Data Reviewed: Basic metabolic panel shows potassium slightly improved, creatinine slightly improved Renal ultrasound normal Protein creatinine ratio 8.3 Hemoglobin up to 9.1    Family Communication: Wife at the bedside    Disposition: Status is: Inpatient         Author: Edwin Dada, MD 04/12/2022 2:23 PM  For on call review [email or web address].

## 2022-04-12 NOTE — Progress Notes (Signed)
Mobility Specialist - Progress Note   04/12/22 1051  Mobility  HOB Elevated/Bed Position Self regulated  Activity Ambulated with assistance in room;Transferred from bed to chair  Range of Motion/Exercises Active  Level of Assistance Contact guard assist, steadying assist  Assistive Device Front wheel walker  Distance Ambulated (ft) 10 ft  Activity Response Tolerated well  Transport method Ambulatory  $Mobility charge 1 Mobility   Pt received in bed and agreeable to mobility. Pt mentioned he has not gotten up since Wed and was hesitant to get up & move. Pt required warming up with leg extensions & marching in place before ambulating to the recliner. Pt left in recliner with wife & NT in room. Pt has all necessities in reach.   [facility]

## 2022-04-12 NOTE — Progress Notes (Signed)
MEDICATION-RELATED CONSULT NOTE   IR Procedure Consult - Anticoagulant/Antiplatelet PTA/Inpatient Med List Review by Pharmacist    Procedure: R IJ Port catheter placement      Completed: 9/22  Post-Procedural bleeding risk per IR MD assessment:   - low   Antithrombotic medications on inpatient or PTA profile prior to procedure:    - None currently active     Recommended restart time per IR Post-Procedure Guidelines:   - Spoke to Dr. Loleta Books - no need to resume enoxaparin at current time.    Other considerations:      Plan:     - Spoke to Dr. Loleta Books - no need to resume enoxaparin at current time. On SCDs  Royetta Asal, PharmD, BCPS 04/12/2022 4:51 PM

## 2022-04-12 NOTE — Progress Notes (Signed)
Mobility Specialist Cancellation/Refusal Note:    04/12/22 1444  Mobility  Activity Refused mobility     Reason for Cancellation/Refusal: Pt declined mobility at this time. Pt mentioned feeling tired. Will check back as schedule permits.       [facility]

## 2022-04-12 NOTE — TOC Progression Note (Signed)
Transition of Care [facility]) - Progression Note    Patient Details  Name: Jorge Adams MRN: [id number] Date of Birth: 03-10-1951  Transition of Care [facility]) CM/SW Contact  Servando Snare, [place] Phone Number: 04/12/2022, 10:03 AM  Clinical Narrative:     Transition of Care [facility]) Screening Note   Patient Details  Name: Jorge Adams Date of Birth: 07-23-1950   Transition of Care [facility]) CM/SW Contact:    Servando Snare, LCSW Phone Number: 04/12/2022, 10:04 AM    Transition of Care Department [facility]) has reviewed patient and no TOC needs have been identified at this time. We will continue to monitor patient advancement through interdisciplinary progression rounds. If new patient transition needs arise, please place a TOC consult.     Expected Discharge Plan: Home/Self Care Barriers to Discharge: Continued Medical Work up  Expected Discharge Plan and Services Expected Discharge Plan: Home/Self Care   Discharge Planning Services: CM Consult   Living arrangements for the past 2 months: Single Family Home                                       Social Determinants of Health (SDOH) Interventions    Readmission Risk Interventions     No data to display

## 2022-04-12 NOTE — Progress Notes (Signed)
[facility] Progress Note  Subjective: pt seen in room, making lots of urine and swelling is somewhat better  Vitals:   04/11/22 1540 04/11/22 1758 04/11/22 2053 04/12/22 0422  BP: (!) 160/88 (!) 149/89 (!) 141/85 (!) 140/83  Pulse: 84 91 86 79  Resp: 18 18 18 18   Temp: 98.6 F (37 C) 98.7 F (37.1 C) 98.6 F (37 C) 98.4 F (36.9 C)  TempSrc: Oral Oral Oral Oral  SpO2: 97% 98% 97% 95%    Exam: Gen alert, no distress No rash, cyanosis or gangrene Sclera anicteric, throat clear  No jvd or bruits Chest clear bilat to bases, no rales/ wheezing RRR no MRG Abd soft ntnd no mass or ascites +bs GU normal males MS no joint effusions or deformity Ext diffuse mild-mod pitting LE > UE edema, no wounds or ulcers Neuro is alert, Ox 3 , nf    Home meds include - albuterol, aspirin, fenofibrate, hydrocodone, levothyroxine, metoprolol 2.5 bid, rosuvastatin, kdur 20 bid, duoneb, prns/ vits/ supps      Date                          Creat               eGFR    2013- 2022               0.5- 0.9    April- jul 2023           0.67- 0.97    Aug 4- 7, 2023         0.65- 0.97        > 60 ml/min    03/19/22                     2.05                 34    03/27/22                     2.26    04/01/22                     2.39         04/10/22                     2.52                 24 ml/min    9/21                          2.62      UA 9/20 - hazy, large Hb, neg ketone/ LE/ nit, protein > 300, rare bact, >50 rbc, 6-10wbc    UP/C ratio 9/21 - 8.39    UCr 83      UNa - pend      CXR 9/20 - IMPRESSION: Stable right basilar consolidation and small right effusion, consistent with persistent pneumonia. These changes obscure an underlying cavitary neoplasm described on prior CT 02/22/2022.        CT chest/ abd/ pelv - 02/22/22, +contrast > IMPRESSION: No evidence of pulmonary embolism.  Interval cavitation of previously noted large right lower lobe mass with what appears to be evolving  postobstructive and postradiation changes in the surrounding lung parenchyma, as above. 3. Aortic atherosclerosis, in addition to left main and three-vessel coronary artery disease. Status  post median sternotomy for CABG including LIMA to the LAD.      BP's here normal in 110- 130/ 75-80 range, HR 82, RR 16, temp afeb 98.6     RA 97%    Urine sediment (undersigned, 9/21) - 25-50 rbc and 3-6 wbc per hpf, minimal cast formation, no convincing RBC casts   Assessment/ Plan: Renal failure - subacute AKI in pt w/ lung cancer dx'd in May 2023. Renal function was normal during chemotherapy (carbo/ paclitaxel) which was 7 weeks from early June to late July. In late Auguest, creat bumped to 2.0.  He started immunotherapy then on 9/07, darvulumab, but per the family they had a hard time getting an IV in, to give him the 1st dose, due to all the swelling in his arms. Admitted now w/ creat up to 2.5, +nephrotic range proteinuria and microhematuria. Carboplatin can be nephrotoxic but causes ATN/ tubular injury, not nephrotic syndrome. Darvulumab has been associated w/ neph syndrome, but his problems (renal failure, swelling) started before the darvulumab was given. Serologies pending. Nephrotic syndrome as a paraneoplastic issue is well-described and is usually membranous GN.  Will need kidney biopsy, have consulted IR for bx next week. Creat stable today. Getting IV lasix, cont 80 bid.   Volume - has edema of LE/ UE's, CXR clear. Suspect 3rd spacing d/t low albumin. Cont lasix as above.  Lung cancer - stage IIIb, sp chemoradiation in June and July. SP immunoRx x 1 in early September ~ 9/7.  CAD h/o CABG HTN - on metoprolol at home HL         Kelly Splinter 04/12/2022, 8:23 AM   Recent Labs  Lab 04/10/22 1950 04/11/22 0547 04/12/22 0620  HGB 9.3* 7.1* 9.1*  ALBUMIN 1.7* <1.5*  --   CALCIUM 7.1* 6.8* 7.3*  CREATININE 2.52* 2.62* 2.59*  K 3.2* 2.8* 3.1*   No results for input(s): "IRON", "TIBC",  "FERRITIN" in the last 168 hours. Inpatient medications:  brimonidine  1 drop Both Eyes BID   dorzolamide-timolol  1 drop Both Eyes BID   enoxaparin (LOVENOX) injection  30 mg Subcutaneous Q24H   fenofibrate  160 mg Oral Daily   furosemide  80 mg Intravenous BID   insulin aspart  0-9 Units Subcutaneous TID WC   levothyroxine  50 mcg Oral Q0600   metoprolol tartrate  12.5 mg Oral BID   Netarsudil-Latanoprost  1 drop Ophthalmic QHS   potassium chloride  40 mEq Oral BID   predniSONE  20 mg Oral Q breakfast   rosuvastatin  20 mg Oral Daily   sodium chloride flush  3 mL Intravenous Q12H    acetaminophen **OR** acetaminophen, ipratropium-albuterol, polyethylene glycol

## 2022-04-13 DIAGNOSIS — N179 Acute kidney failure, unspecified: Secondary | ICD-10-CM | POA: Diagnosis not present

## 2022-04-13 LAB — BASIC METABOLIC PANEL
Anion gap: 7 (ref 5–15)
BUN: 22 mg/dL (ref 8–23)
CO2: 28 mmol/L (ref 22–32)
Calcium: 7.2 mg/dL — ABNORMAL LOW (ref 8.9–10.3)
Chloride: 107 mmol/L (ref 98–111)
Creatinine, Ser: 2.35 mg/dL — ABNORMAL HIGH (ref 0.61–1.24)
GFR, Estimated: 29 mL/min — ABNORMAL LOW (ref >60)
Glucose, Bld: 104 mg/dL — ABNORMAL HIGH (ref 70–99)
Potassium: 3 mmol/L — ABNORMAL LOW (ref 3.5–5.1)
Sodium: 142 mmol/L (ref 135–145)

## 2022-04-13 LAB — CBC
HCT: 30.2 % — ABNORMAL LOW (ref 39.0–52.0)
Hemoglobin: 9.6 g/dL — ABNORMAL LOW (ref 13.0–17.0)
MCH: 28.5 pg (ref 26.0–34.0)
MCHC: 31.8 g/dL (ref 30.0–36.0)
MCV: 89.6 fL (ref 80.0–100.0)
Platelets: 153 10*3/uL (ref 150–400)
RBC: 3.37 MIL/uL — ABNORMAL LOW (ref 4.22–5.81)
RDW: 16.4 % — ABNORMAL HIGH (ref 11.5–15.5)
WBC: 4.6 10*3/uL (ref 4.0–10.5)
nRBC: 0 % (ref 0.0–0.2)

## 2022-04-13 LAB — MAGNESIUM: Magnesium: 1.3 mg/dL — ABNORMAL LOW (ref 1.7–2.4)

## 2022-04-13 LAB — C3 COMPLEMENT: C3 Complement: 113 mg/dL (ref 82–167)

## 2022-04-13 LAB — GLUCOSE, CAPILLARY
Glucose-Capillary: 91 mg/dL (ref 70–99)
Glucose-Capillary: 224 mg/dL — ABNORMAL HIGH (ref 70–99)
Glucose-Capillary: 169 mg/dL — ABNORMAL HIGH (ref 70–99)
Glucose-Capillary: 215 mg/dL — ABNORMAL HIGH (ref 70–99)

## 2022-04-13 LAB — GLOMERULAR BASEMENT MEMBRANE ANTIBODIES: GBM Ab: <0.2 units (ref 0.0–0.9)

## 2022-04-13 LAB — C4 COMPLEMENT: Complement C4, Body Fluid: 30 mg/dL (ref 12–38)

## 2022-04-13 MED ORDER — POTASSIUM CHLORIDE CRYS ER 20 MEQ PO TBCR
40.0000 meq | EXTENDED_RELEASE_TABLET | Freq: Three times a day (TID) | ORAL | Status: DC
  Administered 2022-04-13 (×3): 40 meq via ORAL
  Filled 2022-04-13 (×4): qty 2

## 2022-04-13 MED ORDER — CHLORHEXIDINE GLUCONATE CLOTH 2 % EX PADS
6.0000 | MEDICATED_PAD | Freq: Every day | CUTANEOUS | Status: DC
  Administered 2022-04-13 – 2022-04-17 (×5): 6 via TOPICAL

## 2022-04-13 MED ORDER — MAGNESIUM SULFATE 2 GM/50ML IV SOLN
2.0000 g | Freq: Once | INTRAVENOUS | Status: AC
  Administered 2022-04-13: 2 g via INTRAVENOUS
  Filled 2022-04-13: qty 50

## 2022-04-13 NOTE — Progress Notes (Signed)
Mobility Specialist - Progress Note   04/13/22 1536  Mobility  HOB Elevated/Bed Position Self regulated  Activity Ambulated with assistance in hallway  Range of Motion/Exercises Active  Level of Assistance Contact guard assist, steadying assist  Assistive Device Front wheel walker  Distance Ambulated (ft) 140 ft  Activity Response Tolerated well  Transport method Ambulatory  $Mobility charge 1 Mobility   Pt received in bed and agreeable to mobility. No complaints during ambulation. Pt to recliner after session with all needs met.     [facility]

## 2022-04-13 NOTE — Progress Notes (Signed)
PT Cancellation Note  Patient Details Name: Jorge Adams MRN: [id number] DOB: [birth year]   Cancelled Treatment:    Reason Eval/Treat Not Completed:  Attempted PT eval again on today-pt declined to participate. He did report he walked with mobility specialist. Requested from patient that he try to work with PT on tomorrow so we can peform PT evaluation and make recommendations. If patient refuses PT a 3rd time on tomorrow, we will sign off.   If patient prefers to work with mobility specialist only, please discontinue PT orders. Thanks.    Weston Anna [facility] 04/13/2022, 3:55 PM

## 2022-04-13 NOTE — Progress Notes (Signed)
  Progress Note   Patient: AGAMJOT KILGALLON QVZ:[id number] DOB: 21-Jan-1951 DOA: 04/10/2022     2 DOS: the patient was seen and examined on 04/13/2022 at 9:57 AM      Brief hospital course: Mr. Anselmi is a 71 y.o. M with squamous lung CA T3N2M0, recently completed carboplatin/paclitaxel and radiation now on durvalumab this month, DM, HTN, HLD, and recently diagnosed hypothyroidism who was admitted with nephrotic syndrome      Assessment and Plan: * AKI (acute kidney injury) ([place]) Creatinine slightly better today.  Net -600 cc in response to Lasix Complements normal - Consult Nephrology - Continue Lasix - Strict I/Os  - Follow SPEP,  glomerular basement membrane Abx, ANCA, ANA -Continue prednisone started yesterday    Hypokalemia Unchanged - Continue potassium supplements, increase  Hypomagnesemia Mag 1.3 -Supplement magnesium  Normocytic anemia Chemotherapy related Hemoglobin 9.6, no change    Stage III squamous cell carcinoma of right lung (HCC)   CAD (coronary artery disease) - Continue fenofibrate, metoprolol, Crestor - Hold aspirin for possible renal biopsy next week  PAD (peripheral artery disease) (HCC) - Hold aspirin - Continue fenofibrate and Crestor  Type 2 diabetes mellitus with vascular disease (HCC) Glucose controlled - Continue sliding scale corrections  Hyperlipidemia LDL goal <70 - Continue fenofibrate, Crestor  Hypertension associated with diabetes (HCC) Blood pressure controlled - Continue Lasix, metoprolol          Subjective: Patient swelling is slightly better, no fever, no hematuria, no confusion     Physical Exam: BP (!) 140/87 (BP Location: Right Arm)   Pulse 78   Temp 97.8 F (36.6 C) (Oral)   Resp 18   Wt 86.6 kg   SpO2 95%   BMI 27.39 kg/m   Adult male, ambulating in the hall, lying in bed, interactive and appropriate RRR, no murmurs, still 3+ pitting edema, worse in the left arm, but also the right arm,  in the legs, anasarcic Respiratory rate normal, lung sounds diminished but no rales or wheezes Abdomen soft without tenderness palpation or guarding, no ascites or distention Skin without rashes Attention normal, affect appropriate, judgment and insight appear normal    Data Reviewed: Basic metabolic panel notable for creatinine down to 2.35, potassium 3 Hemogram notable for hemoglobin 9.6, no change from yesterday Complements normal  Family Communication: Wife at the bedside    Disposition: Status is: Inpatient         Author: Edwin Dada, MD 04/13/2022 10:45 AM  For on call review [email or web address].

## 2022-04-13 NOTE — Progress Notes (Signed)
[facility] Progress Note  Subjective: pt seen in room, swelling continues to improve, walked in hall w/ PT which is an improvement.   Vitals:   04/12/22 1600 04/12/22 2120 04/13/22 0500 04/13/22 0506  BP: (!) 162/85 (!) 141/84  (!) 140/87  Pulse: 66 84  78  Resp: 10 18  18   Temp:  98.4 F (36.9 C)  97.8 F (36.6 C)  TempSrc:  Oral  Oral  SpO2: 96% 95%  95%  Weight:   86.6 kg     Exam: Gen alert, no distress No jvd or bruits Chest clear bilat to bases RRR no MRG Abd soft ntnd no mass or ascites +bs Ext diffuse mild-mod pitting LE > UE edema Neuro is alert, Ox 3 , nf    Home meds include - albuterol, aspirin, fenofibrate, hydrocodone, levothyroxine, metoprolol 2.5 bid, rosuvastatin, kdur 20 bid, duoneb, prns/ vits/ supps      Date                          Creat               eGFR    2013- 2022               0.5- 0.9    April- jul 2023           0.67- 0.97    Aug 4- 7, 2023         0.65- 0.97        > 60 ml/min    03/19/22                     2.05                 34    03/27/22                     2.26    04/01/22                     2.39         04/10/22                     2.52                 24 ml/min    9/21                          2.62      UA 9/20 - hazy, large Hb, neg ketone/ LE/ nit, protein > 300, rare bact, >50 rbc, 6-10wbc    UP/C ratio = 8.39    UCr 83, 24, 41     UNa 85     CXR 9/20 - IMPRESSION: Stable right basilar consolidation and small right effusion, consistent with persistent pneumonia. These changes obscure an underlying cavitary neoplasm described on prior CT 02/22/2022.        CT chest/ abd/ pelv - 02/22/22, +contrast > IMPRESSION: No evidence of pulmonary embolism.  Interval cavitation of previously noted large right lower lobe mass with what appears to be evolving postobstructive and postradiation changes in the surrounding lung parenchyma, as above. 3. Aortic atherosclerosis, in addition to left main and three-vessel coronary artery  disease. Status post median sternotomy for CABG including LIMA to the LAD.      BP's here normal in 110- 130/ 75-80 range,  HR 82, RR 16, temp afeb 98.6     RA 97%    Urine sediment (undersigned, 9/21) - 25-50 rbc and 3-6 wbc per hpf, minimal cast formation, no convincing RBC casts   Assessment/ Plan: Renal failure - subacute AKI in pt w/ lung cancer dx'd in May 2023. Renal function was normal during chemotherapy (carbo/ paclitaxel) which was 7 weeks from early June to late July. In late Auguest, creat bumped to 2.0.  He started immunotherapy then on 9/07, darvulumab, but per the family they had a hard time getting an IV in, to give him the 1st dose, due to all the swelling in his arms. Admitted now w/ creat up to 2.5, +nephrotic range proteinuria and microhematuria. Sediment shows rbc's somewhat dysmorphic, but no sig cast formations. Carboplatin can be nephrotoxic but causes ATN/ tubular injury not nephrotic syndrome. Darvulumab has been associated w/ neph syndrome, but his problems started before the darvulumab was given. Suspect this is paraneoplastic. 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  Needs biopsy. IR have him on schedule for renal biopsy Tuesday next week. Creat stable, down a bit, on IV lasix 80 bid.  Will continue to diuresis.  Hypokalemia - getting 40 po kcl tid today, agree. Follow up in am.  Volume - has edema of LE/ UE's, CXR clear. Suspect 3rd spacing d/t low albumin. Cont lasix as above.  Lung cancer - stage IIIb, sp chemoradiation in June and July. SP immunoRx x 1 in early September ~ 9/7.  CAD h/o CABG HTN - on metoprolol at home HL  Kelly Splinter 04/13/2022, 10:10 AM  Recent Labs  Lab 04/10/22 1950 04/11/22 0547 04/12/22 0620 04/13/22 0629  HGB 9.3* 7.1* 9.1* 9.6*  ALBUMIN 1.7* <1.5*  --   --   CALCIUM 7.1* 6.8* 7.3* 7.2*  CREATININE 2.52* 2.62* 2.59* 2.35*  K 3.2* 2.8* 3.1* 3.0*    No results for input(s): "IRON", "TIBC", "FERRITIN" in the last 168  hours. Inpatient medications:  brimonidine  1 drop Both Eyes BID   Chlorhexidine Gluconate Cloth  6 each Topical Daily   dorzolamide-timolol  1 drop Both Eyes BID   fenofibrate  160 mg Oral Daily   furosemide  80 mg Intravenous BID   insulin aspart  0-9 Units Subcutaneous TID WC   levothyroxine  50 mcg Oral Q0600   metoprolol tartrate  12.5 mg Oral BID   mupirocin ointment  1 Application Nasal BID   Netarsudil-Latanoprost  1 drop Ophthalmic QHS   potassium chloride  40 mEq Oral TID   predniSONE  20 mg Oral Q breakfast   rosuvastatin  20 mg Oral Daily   sodium chloride flush  3 mL Intravenous Q12H    acetaminophen **OR** acetaminophen, ipratropium-albuterol, polyethylene glycol

## 2022-04-14 DIAGNOSIS — M109 Gout, unspecified: Secondary | ICD-10-CM

## 2022-04-14 DIAGNOSIS — N179 Acute kidney failure, unspecified: Secondary | ICD-10-CM | POA: Diagnosis not present

## 2022-04-14 LAB — GLUCOSE, CAPILLARY
Glucose-Capillary: 90 mg/dL (ref 70–99)
Glucose-Capillary: 163 mg/dL — ABNORMAL HIGH (ref 70–99)
Glucose-Capillary: 196 mg/dL — ABNORMAL HIGH (ref 70–99)
Glucose-Capillary: 131 mg/dL — ABNORMAL HIGH (ref 70–99)

## 2022-04-14 LAB — BASIC METABOLIC PANEL
Anion gap: 7 (ref 5–15)
BUN: 21 mg/dL (ref 8–23)
CO2: 29 mmol/L (ref 22–32)
Calcium: 7.2 mg/dL — ABNORMAL LOW (ref 8.9–10.3)
Chloride: 102 mmol/L (ref 98–111)
Creatinine, Ser: 2.28 mg/dL — ABNORMAL HIGH (ref 0.61–1.24)
GFR, Estimated: 30 mL/min — ABNORMAL LOW (ref >60)
Glucose, Bld: 95 mg/dL (ref 70–99)
Potassium: 3 mmol/L — ABNORMAL LOW (ref 3.5–5.1)
Sodium: 138 mmol/L (ref 135–145)

## 2022-04-14 MED ORDER — PROCHLORPERAZINE EDISYLATE 10 MG/2ML IJ SOLN
10.0000 mg | Freq: Four times a day (QID) | INTRAMUSCULAR | Status: DC | PRN
  Administered 2022-04-14: 10 mg via INTRAVENOUS
  Filled 2022-04-14: qty 2

## 2022-04-14 MED ORDER — POTASSIUM CHLORIDE 10 MEQ/50ML IV SOLN
10.0000 meq | INTRAVENOUS | Status: DC
  Filled 2022-04-14 (×2): qty 50

## 2022-04-14 MED ORDER — CALCIUM GLUCONATE-NACL 1-0.675 GM/50ML-% IV SOLN
1.0000 g | Freq: Once | INTRAVENOUS | Status: AC
  Administered 2022-04-14: 1000 mg via INTRAVENOUS
  Filled 2022-04-14: qty 50

## 2022-04-14 MED ORDER — POTASSIUM CHLORIDE 20 MEQ PO PACK
40.0000 meq | PACK | Freq: Three times a day (TID) | ORAL | Status: DC
  Administered 2022-04-14 (×3): 40 meq via ORAL
  Filled 2022-04-14 (×4): qty 2

## 2022-04-14 MED ORDER — MAGNESIUM SULFATE 2 GM/50ML IV SOLN: 2.0000 g | Freq: Once | INTRAVENOUS | Status: DC

## 2022-04-14 MED ORDER — PREDNISONE 5 MG PO TABS
10.0000 mg | ORAL_TABLET | Freq: Every day | ORAL | Status: DC
  Administered 2022-04-15 – 2022-04-16 (×2): 10 mg via ORAL
  Filled 2022-04-14 (×2): qty 2

## 2022-04-14 MED ORDER — POTASSIUM CHLORIDE 10 MEQ/50ML IV SOLN
10.0000 meq | INTRAVENOUS | Status: AC
  Administered 2022-04-14 (×4): 10 meq via INTRAVENOUS
  Filled 2022-04-14 (×4): qty 50

## 2022-04-14 MED ORDER — MAGNESIUM SULFATE 2 GM/50ML IV SOLN
2.0000 g | Freq: Once | INTRAVENOUS | Status: AC
  Administered 2022-04-14: 2 g via INTRAVENOUS
  Filled 2022-04-14: qty 50

## 2022-04-14 NOTE — Progress Notes (Signed)
Progress Note   Patient: Jorge Adams [id number]:[id number] DOB: May 02, 1951 DOA: 04/10/2022     3 DOS: the patient was seen and examined on 04/14/2022 at 10:09AM      Brief hospital course: Mr. Jorge Adams is a 71 y.o. M with squamous lung CA T3N2M0, recently completed carboplatin/paclitaxel and radiation now on durvalumab this month, DM, HTN, HLD, and recently diagnosed hypothyroidism who presented with edema and elevated BNP.  Went to PCP few days ago for week or more of progressive SOB and edema.  PCP found AKI with Cr 2.3, BNP >300 and called and referred to the ER.  In the ER, CXR showed stable RLL opacity, maybe some superimposed edema.  Cr 2.5, troponin 21.      9/20: Admitted on Lasix 9/21: Cr worse, Nephrology and Cardiology consulted. Prednisone started 9/22: Port to be placed for improved access 9/24: Cr improving      Assessment and Plan: * AKI (acute kidney injury) ([place]) Cr timing increase is over weeks.   Baseline Cr 0.7-0.9 Date     SCr 8/7:       0.72 8/29:     2.05 9/6:       2.26 (edema noted) 9/7: Started Imfinzi 9/19:     2.54 (on admission here)  Urine relatively acellular, has granular casts. P/C ratio >8, has marked edema, albumin <1.5 - Continue IV Lasix - Strict I/Os  - Consult Nephrology - Plan for biopsy next week - Follow SPEP, complements, glomerular basement membrane Abx, ANCA, ANA     Hypocalcemia Corrects to low normal - Supp Ca  Gout PCP started gout for left wrist swelling. Now feel more confident this was from anasarca from nephrotic syndrome, not gout. - Taper off prednisone in next 3 days   Hypokalemia No change - Continue Supp K again  Hypomagnesemia - Supp Mag  Normocytic anemia 7.1 on admission, trasnsfused 1 unit 9/21 Up to 9.1 today. Normocytic.  Ferritin and iron saturation good within the last month.  Suspect it is chemotherapy related.    Stage III squamous cell carcinoma of right lung (HCC) Dr. Earlie Adams  has been added to the treatment team.  Currently finished one cycle Durvalumab.  CAD (coronary artery disease) - Continue fenofibrate, metoprolol, Crestor - Hold aspirin for possible renal biopsy next week  PAD (peripheral artery disease) (HCC) - Hold aspirin - Continue fenofibrate and Crestor  Type 2 diabetes mellitus with vascular disease (HCC) Glucose controlled - Continue sliding scale corrections  Hyperlipidemia LDL goal <70 - Continue fenofibrate, Crestor  Hypertension associated with diabetes ([place]) Blood pressure still slightly elevated - Continue Lasix, metoprolol          Subjective: Patient feels like his swelling is better, his creatinine  , Fever, respiratory symptoms.     Physical Exam: BP (!) 140/85 (BP Location: Right Arm)   Pulse 82   Temp (!) 97.4 F (36.3 C) (Oral)   Resp 16   Wt 83.2 kg   SpO2 95%   BMI 26.32 kg/m   Elderly adult male, pale, lying in bed, no acute distress RRR, no murmurs, improving peripheral edema Respiratory rate normal, lungs clear without rales or wheezes Abdomen soft without tenderness palpation or guarding, no ascites or distention Attention normal, affect normal, judgment insight appear normal   Data Reviewed: Discussed with nephrology Basic metabolic panel shows persistent hypokalemia, hypocalcemia, hypomagnesemia, creatinine slightly better  Family Communication: Wife at the bedside    Disposition: Status is: Inpatient  Author: Edwin Dada, MD 04/14/2022 3:24 PM  For on call review [email or web address]

## 2022-04-14 NOTE — Progress Notes (Addendum)
Referring Physician(s): Schertz,R  Supervising Physician: Mir, Sharen Heck  Patient Status:  [facility] IP  Chief Complaint: Lung cancer, bilateral extremity edema, nephrotic  syndrome with subacute renal failure   Subjective: Patient known to IR service from Port-A-Cath placement on 04/12/2022.  He has a history of stage IIIb lung cancer, DM,coronary artery disease with prior MI/stent/ CABG, hypertension, hyperlipidemia, and now with nephrotic syndrome with subacute renal failure.  Request received from nephrology service for random core renal biopsy.  She currently denies fever, headache, chest pain, worsening dyspnea, cough, abdominal/back pain, nausea, vomiting or bleeding.  He does have bilateral upper and lower extremity edema. Additional med history as below.   Past Medical History:  Diagnosis Date   Arthritis    ASHD (arteriosclerotic heart disease)    Atherosclerosis of native arteries of the extremities with intermittent claudication    Cataract    Coronary atherosclerosis of native coronary artery    Diabetes mellitus    NIDDM   Dyslipidemia    ED (erectile dysfunction)    Full dentures    Glaucoma associated with ocular disorder, mild stage    bilateral   Hyperlipidemia    Hypertension    under control, has been on med. > 9 yr.   Impotence of organic origin    Meniscus tear 05/2012   right knee   Myocardial infarction [facility]) 10/2002   Stented coronary artery    Past Surgical History:  Procedure Laterality Date   ANTERIOR CERVICAL DECOMP/DISCECTOMY FUSION N/A 05/07/2018   Procedure: ANTERIOR CERVICAL DECOMPRESSION/DISCECTOMY FUSION CERVICAL FIVE - CERVICAL SIX;  Surgeon: Consuella Lose, MD;  Location: [place];  Service: Neurosurgery;  Laterality: N/A;  ANTERIOR CERVICAL DECOMPRESSION/DISCECTOMY FUSION CERVICAL FIVE - CERVICAL SIX   BRONCHIAL BIOPSY  12/04/2021   Procedure: BRONCHIAL BIOPSIES;  Surgeon: Collene Gobble, MD;  Location: [place];  Service:  Cardiopulmonary;;   BRONCHIAL BRUSHINGS  12/04/2021   Procedure: BRONCHIAL BRUSHINGS;  Surgeon: Collene Gobble, MD;  Location: [place];  Service: Cardiopulmonary;;   BRONCHIAL NEEDLE ASPIRATION BIOPSY  12/04/2021   Procedure: BRONCHIAL NEEDLE ASPIRATION BIOPSIES;  Surgeon: Collene Gobble, MD;  Location: [place];  Service: Cardiopulmonary;;   CARDIAC CATHETERIZATION  10/23/2002   had 1 stent placed then-- by Dr. Melvern Banker   CARDIAC CATHETERIZATION N/A 12/21/2015   Procedure: Left Heart Cath and Coronary Angiography;  Surgeon: Adrian Prows, MD;  Location: [place] CV LAB;  Service: Cardiovascular;  Laterality: N/A;   CATARACT EXTRACTION Right 05/2017   CATARACT EXTRACTION Left 07/2017   COLONOSCOPY  10/08/2010   Dr.Patterson   CORONARY ARTERY BYPASS GRAFT N/A 01/02/2016   Procedure: CORONARY ARTERY BYPASS GRAFTING (CABG) TIMES 3 USING LEFT INTERNAL MAMMARY ARTERY AND RIGHT SAPHENOUS LEG VEIN HARVESTED ENDOSCOPICALLY;  Surgeon: Ivin Poot, MD;  Location: [place];  Service: Open Heart Surgery;  Laterality: N/A;   EYE SURGERY     INSERTION OF MESH N/A 05/06/2016   Procedure: INSERTION OF MESH;  Surgeon: Georganna Skeans, MD;  Location: [place];  Service: General;  Laterality: N/A;   IR IMAGING GUIDED PORT INSERTION  04/12/2022   KNEE ARTHROSCOPY WITH MEDIAL MENISECTOMY  06/04/2012   Procedure: KNEE ARTHROSCOPY WITH MEDIAL MENISECTOMY;  Surgeon: Ninetta Lights, MD;  Location: [place];  Service: Orthopedics;  Laterality: Right;  RIGHT SCOPE MEDIAL MENISCECTOMY, CHONDROPLASTY, EXCISION LOOSE BODY   LOWER EXTREMITY ANGIOGRAM N/A 11/23/2013   Procedure: LOWER EXTREMITY ANGIOGRAM;  Surgeon: Laverda Page, MD;  Location: [facility] CATH  LAB;  Service: Cardiovascular;  Laterality: N/A;   NECK SURGERY N/A    02/19/2018   PERIPHERAL VASCULAR CATHETERIZATION N/A 12/21/2015   Procedure: Abdominal Aortogram;  Surgeon: Adrian Prows, MD;  Location: [place] CV LAB;  Service: Cardiovascular;   Laterality: N/A;   PERIPHERAL VASCULAR CATHETERIZATION N/A 12/21/2015   Procedure: Abdominal Aortogram w/Lower Extremity;  Surgeon: Adrian Prows, MD;  Location: [place] CV LAB;  Service: Cardiovascular;  Laterality: N/A;   TEE WITHOUT CARDIOVERSION N/A 01/02/2016   Procedure: TRANSESOPHAGEAL ECHOCARDIOGRAM (TEE);  Surgeon: Ivin Poot, MD;  Location: [place];  Service: Open Heart Surgery;  Laterality: N/A;   UMBILICAL HERNIA REPAIR N/A 05/06/2016   Procedure: HERNIA REPAIR UMBILICAL ADULT;  Surgeon: Georganna Skeans, MD;  Location: [place];  Service: General;  Laterality: N/A;   VIDEO BRONCHOSCOPY N/A 12/04/2021   Procedure: VIDEO BRONCHOSCOPY WITH FLUORO;  Surgeon: Collene Gobble, MD;  Location: [place];  Service: Cardiopulmonary;  Laterality: N/A;      Allergies: Metformin and related  Medications: Prior to Admission medications   Medication Sig Start Date End Date Taking? Authorizing Provider  albuterol (VENTOLIN HFA) 108 (90 Base) MCG/ACT inhaler Inhale 1-2 puffs into the lungs every 6 (six) hours as needed (for cough.  okay to fill with albuterol/proair/ventolin.). Patient taking differently: Inhale 2 puffs into the lungs 3 (three) times daily as needed for shortness of breath. 11/06/21  Yes [name], MD  aspirin EC 81 MG tablet Take 1 tablet (81 mg total) by mouth daily. Okay to restart this medication on 12/05/2021. 12/04/21  Yes Collene Gobble, MD  brimonidine (ALPHAGAN P) 0.1 % SOLN Place 2 drops into both eyes 2 (two) times daily.   Yes [provider]  cilostazol (PLETAL) 100 MG tablet Take 1 tablet (100 mg total) by mouth 2 (two) times daily. Okay to restart this medication on 12/05/2021. 12/04/21  Yes Collene Gobble, MD  Cyanocobalamin (VITAMIN B12 PO) Take 1 tablet by mouth in the morning and at bedtime.   Yes [provider]  dorzolamide-timolol (COSOPT) 22.3-6.8 MG/ML ophthalmic solution Place 1 drop into both eyes 2 (two) times daily.   Yes  [provider]  FeFum-FePoly-FA-B Cmp-C-Biot (INTEGRA PLUS) CAPS Take 1 capsule by mouth every morning. 03/06/22  Yes Heilingoetter, [name] L, PA-C  fenofibrate (TRICOR) 145 MG tablet TAKE 1 TABLET BY MOUTH EVERY DAY IN THE EVENING Patient taking differently: Take 145 mg by mouth daily. 01/03/22  Yes Adrian Prows, MD  HYDROcodone bit-homatropine (HYCODAN) 5-1.5 MG/5ML syrup Take 5 mLs by mouth every 8 (eight) hours as needed for cough (sedation caution.). 04/01/22  Yes [name], MD  levothyroxine (SYNTHROID) 50 MCG tablet Take 1 tablet (50 mcg total) by mouth daily before breakfast. 03/27/22  Yes Curt Bears, MD  metoprolol tartrate (LOPRESSOR) 25 MG tablet Take 0.5 tablets (12.5 mg total) by mouth 2 (two) times daily. Hold for SBP less than 110 Patient taking differently: Take 25 mg by mouth 2 (two) times daily. Hold for SBP less than 110 02/25/22  Yes Lama, Marge Duncans, MD  Netarsudil-Latanoprost (ROCKLATAN) 0.02-0.005 % SOLN Apply 1 drop to eye at bedtime. In both eyes   Yes [provider]  nitroGLYCERIN (NITROSTAT) 0.4 MG SL tablet Place 1 tablet (0.4 mg total) under the tongue every 5 (five) minutes as needed for chest pain. 11/06/21  Yes [name], MD  omega-3 acid ethyl esters (LOVAZA) 1 g capsule TAKE 1 CAPSULE BY MOUTH TWICE A  DAY Patient taking differently: Take 1 g by mouth 2 (two) times daily. 01/03/22  Yes Adrian Prows, MD  predniSONE (DELTASONE) 10 MG tablet Take 2 a day for 5 days, then 1 a day for 5 days, with food. Don't take with aleve/ibuprofen. 04/10/22  Yes [name], MD  prochlorperazine (COMPAZINE) 10 MG tablet Take 1 tablet (10 mg total) by mouth every 6 (six) hours as needed for nausea or vomiting. Patient taking differently: Take 10 mg by mouth 2 (two) times daily as needed for nausea or vomiting. 02/04/22  Yes Heilingoetter, [name] L, PA-C  rosuvastatin (CRESTOR) 20 MG tablet TAKE 1 TABLET BY MOUTH EVERY DAY Patient taking differently: Take  20 mg by mouth daily. 07/30/21  Yes Adrian Prows, MD  blood glucose meter kit and supplies Dispense based on pt and insurance preference. Use up to 3 times daily as directed. (Dx. E11.9). 09/08/20   [name], MD  Blood Pressure Monitoring [facility]) MISC Use daily to check BP.  Dx I10 08/15/20   [name], MD  ipratropium-albuterol (DUONEB) 0.5-2.5 (3) MG/3ML SOLN Take 3 mLs by nebulization every 6 (six) hours as needed. Patient not taking: Reported on 04/10/2022 02/25/22   Oswald Hillock, MD  potassium chloride SA (KLOR-CON M) 20 MEQ tablet Take 1 tablet (20 mEq total) by mouth 2 (two) times daily. 04/10/22   Ria Bush, MD     Vital Signs: BP (!) 149/83   Pulse 86   Temp 98 F (36.7 C) (Oral)   Resp 16   Wt 183 lb 6.8 oz (83.2 kg)   SpO2 94%   BMI 26.32 kg/m   Physical Exam awake, alert.  Chest with slightly diminished breath sounds right base, left clear.  clean intact right chest wall Port-A-Cath.  Heart with regular rate/ rhythm.  Abdomen soft, positive bowel sounds, nontender.  Bilateral LE>UE edema noted  Imaging: IR IMAGING GUIDED PORT INSERTION  Result Date: 04/12/2022 CLINICAL DATA:  Lung carcinoma, needs durable venous access for treatment regimen EXAM: TUNNELED PORT CATHETER PLACEMENT WITH ULTRASOUND AND FLUOROSCOPIC GUIDANCE FLUOROSCOPY: Radiation Exposure Index (as provided by the fluoroscopic device): 5 mGy air Kerma ANESTHESIA/SEDATION: Intravenous Fentanyl 46mg and Versed 146mwere administered as conscious sedation during continuous monitoring of the patient's level of consciousness and physiological / cardiorespiratory status by the radiology RN, with a total moderate sedation time of 17 minutes. TECHNIQUE: The procedure, risks, benefits, and alternatives were explained to the patient. Questions regarding the procedure were encouraged and answered. The patient understands and consents to the procedure. Patency of the right IJ vein was confirmed with  ultrasound with image documentation. An appropriate skin site was determined. Skin site was marked. Region was prepped using maximum barrier technique including cap and mask, sterile gown, sterile gloves, large sterile sheet, and Chlorhexidine as cutaneous antisepsis. The region was infiltrated locally with 1% lidocaine. Under real-time ultrasound guidance, the right IJ vein was accessed with a 21 gauge micropuncture needle; the needle tip within the vein was confirmed with ultrasound image documentation. Needle was exchanged over a 018 guidewire for transitional dilator, and vascular measurement was performed. A small incision was made on the right anterior chest wall and a subcutaneous pocket fashioned. The power-injectable port was positioned and its catheter tunneled to the right IJ dermatotomy site. The transitional dilator was exchanged over an Amplatz wire for a peel-away sheath, through which the port catheter, which had been trimmed to the appropriate length, was advanced and positioned under fluoroscopy  with its tip at the cavoatrial junction. Spot chest radiograph confirms good catheter position and no pneumothorax. The port was flushed per protocol. The pocket was closed with deep interrupted and subcuticular continuous 3-0 Monocryl sutures. The incisions were covered with Dermabond then covered with a sterile dressing. The patient tolerated the procedure well. COMPLICATIONS: COMPLICATIONS None immediate IMPRESSION: Technically successful right IJ power-injectable port catheter placement. Ready for routine use. Electronically Signed   By: Lucrezia Europe M.D.   On: 04/12/2022 16:15   US RENAL  Result Date: 04/11/2022 CLINICAL DATA:  Acute renal injury EXAM: RENAL / URINARY TRACT ULTRASOUND COMPLETE COMPARISON:  None Available. FINDINGS: Right Kidney: Renal measurements: 12.8 x 6.2 x 5.9 cm = volume: 270 mL. Echogenicity within normal limits. No mass or hydronephrosis visualized. Left Kidney: Renal  measurements: 14.4 x 7.3 x 5.9 cm = volume: 323 mL.  3 Bladder: Appears normal for degree of bladder distention. Other: None. IMPRESSION: 1. Normal renal ultrasound. 2. Normal bladder. Electronically Signed   By: Suzy Bouchard M.D.   On: 04/11/2022 15:52   DG Chest Port 1 View  Result Date: 04/10/2022 CLINICAL DATA:  Short of breath, hypokalemia EXAM: PORTABLE CHEST 1 VIEW COMPARISON:  04/10/2022, 04/01/2022 FINDINGS: Single frontal view of the chest demonstrates a stable cardiac silhouette. Postsurgical changes from CABG. Continued consolidation at the right lung base, which obscures a known cavitary right lower lobe lesion seen on prior CT. Small right pleural effusion unchanged. No new airspace disease or pneumothorax. No acute bony abnormalities. IMPRESSION: 1. Stable right basilar consolidation and small right effusion, consistent with persistent pneumonia. These changes obscure an underlying cavitary neoplasm described on prior CT 02/22/2022. Electronically Signed   By: Randa Ngo M.D.   On: 04/10/2022 21:01    Labs:  CBC: Recent Labs    04/10/22 1950 04/11/22 0547 04/12/22 0620 04/13/22 0629  WBC 5.5 3.6* 3.9* 4.6  HGB 9.3* 7.1* 9.1* 9.6*  HCT 29.9* 22.5* 28.5* 30.2*  PLT 161 117* 137* 153    COAGS: Recent Labs    02/12/22 0324 02/23/22 0033  INR 1.6* 1.7*  APTT 38*  --     BMP: Recent Labs    04/11/22 0547 04/12/22 0620 04/13/22 0629 04/14/22 0500  NA 139 141 142 138  K 2.8* 3.1* 3.0* 3.0*  CL 109 109 107 102  CO2 _0 GLUCOSE 119* 89 104* 95  BUN _1 CALCIUM 6.8* 7.3* 7.2* 7.2*  CREATININE 2.62* 2.59* 2.35* 2.28*  GFRNONAA 25* 26* 29* 30*    LIVER FUNCTION TESTS: Recent Labs    04/01/22 1548 04/10/22 1035 04/10/22 1950 04/11/22 0547  BILITOT 0.4 0.5 0.7 0.4  AST 26 26 35 25  ALT _2 ALKPHOS 41 46 48 35*  PROT 4.9* 5.0* 5.2* 4.0*  ALBUMIN 2.0* 2.0* 1.7* <1.5*    Assessment and Plan: Patient known to IR service  from Port-A-Cath placement on 04/12/2022.  He has a history of stage IIIb lung cancer, DM,coronary artery disease with prior MI/stent/ CABG, hypertension, hyperlipidemia, and now with nephrotic syndrome with subacute renal failure.  Request received from nephrology service for random core renal biopsy.  Risks and benefits of procedure was discussed with the patient/spouse  including, but not limited to bleeding, infection, damage to adjacent structures or low yield requiring additional tests.  All of the questions were answered and there is agreement to proceed.  Consent signed and in chart. Procedure tent scheduled  for either 9/25 or 9/26.  Check am labs  Electronically Signed: D. Rowe Robert, PA-C 04/14/2022, 11:17 AM   I spent a total of 25 Minutes at the the patient's bedside AND on the patient's hospital floor or unit, greater than 50% of which was counseling/coordinating care for image guided random core renal biopsy    Patient ID: ODES LOLLI, male   DOB: 02-07-1951, 71 y.o.   MRN: [id number]

## 2022-04-14 NOTE — Progress Notes (Signed)
PT Cancellation Note  Patient Details Name: Jorge Adams MRN: [id number] DOB: [birth year]   Cancelled Treatment:     Pt has been walking with NT in hallway  ( length of hallway). Screened from PT standpoint. Pt states he has RW at home and no need for HHPT. Educated to pt and patients family member if they feel he is declining at home they can ask oncologist or PCP for HHPT or OPPPT if he needs more. At this time no PT needs. Encourage pt to walk with nursing and mobility specialists.    Clide Dales 04/14/2022, 4:50 PM Gatha Mayer, PT, MPT Acute Rehabilitation Services Office: [phone] If a weekend: [facility] w/e pager [phone] 04/14/2022

## 2022-04-14 NOTE — Assessment & Plan Note (Addendum)
PCP started gout for left wrist swelling. Now feel more confident this was from anasarca from nephrotic syndrome, not gout. - Tapered off. -Outpatient follow-up.

## 2022-04-14 NOTE — Assessment & Plan Note (Addendum)
Corrects to low normal -Outpatient follow-up with hematology/oncology.

## 2022-04-14 NOTE — Progress Notes (Signed)
[facility] Progress Note  Subjective: pt seen in room, swelling continues to improve, creat down 2.2, UOP 1.7 L yest and 1.5 L today so far. Getting IV/ po KCl today and IV mag.   Vitals:   04/13/22 2108 04/14/22 0500 04/14/22 0506 04/14/22 0924  BP: (!) 141/82  (!) 147/92 (!) 149/83  Pulse: 90  86   Resp: 18  16   Temp: 98.1 F (36.7 C)  98 F (36.7 C)   TempSrc: Oral  Oral   SpO2: 97%  94%   Weight:  83.2 kg      Exam: Gen alert, no distress No jvd or bruits Chest clear bilat to bases RRR no MRG Abd soft ntnd no mass or ascites +bs Ext diffuse mild-mod pitting LE > UE edema Neuro is alert, Ox 3 , nf    Home meds include - albuterol, aspirin, fenofibrate, hydrocodone, levothyroxine, metoprolol 2.5 bid, rosuvastatin, kdur 20 bid, duoneb, prns/ vits/ supps      Date                          Creat               eGFR    2013- 2022               0.5- 0.9    April- jul 2023           0.67- 0.97    Aug 4- 7, 2023         0.65- 0.97        > 60 ml/min    03/19/22                     2.05                 34    03/27/22                     2.26    04/01/22                     2.39         04/10/22                     2.52                 24 ml/min    9/21                          2.62      UA 9/20 - hazy, large Hb, neg ketone/ LE/ nit, protein > 300, rare bact, >50 rbc, 6-10wbc    UP/C ratio = 8.39    UCr 83, 24, 41     UNa 85     CXR 9/20 - IMPRESSION: Stable right basilar consolidation and small right effusion, consistent with persistent pneumonia. These changes obscure an underlying cavitary neoplasm described on prior CT 02/22/2022.        CT chest/ abd/ pelv - 02/22/22, +contrast > IMPRESSION: No evidence of pulmonary embolism.  Interval cavitation of previously noted large right lower lobe mass with what appears to be evolving postobstructive and postradiation changes in the surrounding lung parenchyma, as above. 3. Aortic atherosclerosis, in addition to left main  and three-vessel coronary artery disease. Status post median sternotomy for CABG including LIMA to the LAD.  BP's here normal in 110- 130/ 75-80 range, HR 82, RR 16, temp afeb 98.6     RA 97%    Urine sediment (undersigned, 9/21) - 25-50 rbc and 3-6 wbc per hpf, minimal cast formation, no convincing RBC casts   Assessment/ Plan: Renal failure - subacute AKI in pt w/ lung cancer dx'd in May 2023. Renal function was normal during chemotherapy (carbo/ paclitaxel) which was 7 weeks from early June to late July. In late Auguest, creat bumped to 2.0.  He started immunotherapy then on 9/07, darvulumab, but per the family they had a hard time getting an IV in, to give him the 1st dose, due to all the swelling in his arms. Admitted now w/ creat up to 2.5, +nephrotic range proteinuria and microhematuria. Sediment shows rbc's somewhat dysmorphic, but no cast formations. Carboplatin can be nephrotoxic but causes ATN/ tubular injury not nephrotic syndrome. Darvulumab has been reported w/ neph syndrome, but his swelling and renal failure predated the 1st dose of darvulumab. May be paraneoplastic. 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  IR consulting for renal bx Monday or Tuesday this week. Creat down a bit. Continue IV lasix another 24- 48 hrs or so.  Hypokalemia/ hypomagnesemia - getting 40 po kcl tid again today w/ IV KCl and IV Mg.  Volume - has edema of LE/ UE's which is improving, CXR clear. Cont lasix as above.  Lung cancer - stage IIIb, sp chemoradiation through June + July. Then started immunoRx 1st dose on Sept 7.  CAD h/o CABG HTN - on metoprolol at home HL  Kelly Splinter 04/14/2022, 12:29 PM  Recent Labs  Lab 04/10/22 1950 04/11/22 0547 04/12/22 0620 04/13/22 0629 04/14/22 0500  HGB 9.3* 7.1* 9.1* 9.6*  --   ALBUMIN 1.7* <1.5*  --   --   --   CALCIUM 7.1* 6.8* 7.3* 7.2* 7.2*  CREATININE 2.52* 2.62* 2.59* 2.35* 2.28*  K 3.2* 2.8* 3.1* 3.0* 3.0*    No results for input(s):  "IRON", "TIBC", "FERRITIN" in the last 168 hours. Inpatient medications:  brimonidine  1 drop Both Eyes BID   Chlorhexidine Gluconate Cloth  6 each Topical Daily   dorzolamide-timolol  1 drop Both Eyes BID   fenofibrate  160 mg Oral Daily   furosemide  80 mg Intravenous BID   insulin aspart  0-9 Units Subcutaneous TID WC   levothyroxine  50 mcg Oral Q0600   metoprolol tartrate  12.5 mg Oral BID   mupirocin ointment  1 Application Nasal BID   Netarsudil-Latanoprost  1 drop Ophthalmic QHS   potassium chloride  40 mEq Oral TID   predniSONE  20 mg Oral Q breakfast   rosuvastatin  20 mg Oral Daily   sodium chloride flush  3 mL Intravenous Q12H    potassium chloride 10 mEq (04/14/22 1226)   acetaminophen **OR** acetaminophen, ipratropium-albuterol, polyethylene glycol

## 2022-04-15 DIAGNOSIS — N179 Acute kidney failure, unspecified: Secondary | ICD-10-CM | POA: Diagnosis not present

## 2022-04-15 LAB — CBC WITH DIFFERENTIAL/PLATELET
Abs Immature Granulocytes: 0.06 10*3/uL (ref 0.00–0.07)
Basophils Absolute: 0 10*3/uL (ref 0.0–0.1)
Basophils Relative: 1 %
Eosinophils Absolute: 0.2 10*3/uL (ref 0.0–0.5)
Eosinophils Relative: 3 %
HCT: 32.8 % — ABNORMAL LOW (ref 39.0–52.0)
Hemoglobin: 10.2 g/dL — ABNORMAL LOW (ref 13.0–17.0)
Immature Granulocytes: 1 %
Lymphocytes Relative: 32 %
Lymphs Abs: 1.8 10*3/uL (ref 0.7–4.0)
MCH: 28.4 pg (ref 26.0–34.0)
MCHC: 31.1 g/dL (ref 30.0–36.0)
MCV: 91.4 fL (ref 80.0–100.0)
Monocytes Absolute: 0.5 10*3/uL (ref 0.1–1.0)
Monocytes Relative: 9 %
Neutro Abs: 3 10*3/uL (ref 1.7–7.7)
Neutrophils Relative %: 54 %
Platelets: 177 10*3/uL (ref 150–400)
RBC: 3.59 MIL/uL — ABNORMAL LOW (ref 4.22–5.81)
RDW: 16 % — ABNORMAL HIGH (ref 11.5–15.5)
WBC: 5.6 10*3/uL (ref 4.0–10.5)
nRBC: 0 % (ref 0.0–0.2)

## 2022-04-15 LAB — BASIC METABOLIC PANEL
Anion gap: 8 (ref 5–15)
BUN: 22 mg/dL (ref 8–23)
CO2: 27 mmol/L (ref 22–32)
Calcium: 7.3 mg/dL — ABNORMAL LOW (ref 8.9–10.3)
Chloride: 103 mmol/L (ref 98–111)
Creatinine, Ser: 2.31 mg/dL — ABNORMAL HIGH (ref 0.61–1.24)
GFR, Estimated: 29 mL/min — ABNORMAL LOW (ref >60)
Glucose, Bld: 87 mg/dL (ref 70–99)
Potassium: 4.2 mmol/L (ref 3.5–5.1)
Sodium: 138 mmol/L (ref 135–145)

## 2022-04-15 LAB — ANCA TITERS
Atypical P-ANCA titer: <1:20 {titer}
C-ANCA: <1:20 {titer}
P-ANCA: <1:20 {titer}

## 2022-04-15 LAB — GLUCOSE, CAPILLARY
Glucose-Capillary: 101 mg/dL — ABNORMAL HIGH (ref 70–99)
Glucose-Capillary: 229 mg/dL — ABNORMAL HIGH (ref 70–99)
Glucose-Capillary: 170 mg/dL — ABNORMAL HIGH (ref 70–99)
Glucose-Capillary: 163 mg/dL — ABNORMAL HIGH (ref 70–99)

## 2022-04-15 LAB — KAPPA/LAMBDA LIGHT CHAINS
Kappa free light chain: 115.5 mg/L — ABNORMAL HIGH (ref 3.3–19.4)
Kappa, lambda light chain ratio: 1.37 (ref 0.26–1.65)
Lambda free light chains: 84.2 mg/L — ABNORMAL HIGH (ref 5.7–26.3)

## 2022-04-15 LAB — MAGNESIUM: Magnesium: 2 mg/dL (ref 1.7–2.4)

## 2022-04-15 LAB — PROTIME-INR
INR: 1.1 (ref 0.8–1.2)
Prothrombin Time: 13.7 seconds (ref 11.4–15.2)

## 2022-04-15 MED ORDER — POTASSIUM CHLORIDE 20 MEQ PO PACK: 40.0000 meq | PACK | Freq: Every day | ORAL | Status: DC

## 2022-04-15 NOTE — Care Management Important Message (Signed)
Important Message  Patient Details IM Letter given to the Patient. Name: Jorge Adams MRN: [id number] Date of Birth: September 01, 1950   Medicare Important Message Given:  Yes     Kerin Salen 04/15/2022, 10:08 AM

## 2022-04-15 NOTE — Progress Notes (Signed)
  Progress Note   Patient: Jorge Adams PVG:[id number] DOB: 1950-08-12 DOA: 04/10/2022     4 DOS: the patient was seen and examined on 04/15/2022 at 9:25AM      Brief hospital course: Mr. Poole is a 71 y.o. M with squamous lung CA T3N2M0, recently completed carboplatin/paclitaxel and radiation now on durvalumab this month, DM, HTN, HLD, and recently diagnosed hypothyroidism who presented with edema and elevated BNP.  Went to PCP few days ago for week or more of progressive SOB and edema.  PCP found AKI with Cr 2.3, BNP >300 and called and referred to the ER.  In the ER, CXR showed stable RLL opacity, maybe some superimposed edema.  Cr 2.5, troponin 21.      9/20: Admitted on Lasix 9/21: Cr worse, Nephrology and Cardiology consulted. Prednisone started 9/22: Port to be placed for improved access 9/24: Cr improving 9/25: Cr stable, biopsy planned for tomorrow      Assessment and Plan: * AKI (acute kidney injury) ([place]) Creatinine stable from yesterday, net -1.8 L yesterday, 5.8 L on admission.  Still quite edematous. - I will continue IV Lasix for now, defer timing of transition to torsemide to nephrology - Continue strict I's and O's - Plan for biopsy tomorrow - Consult Nephrology, appreciate expertise - Plan for biopsy next week - Follow SPEP, ANCA, ANA      Gout PCP started gout for left wrist swelling prior to admission. Now feel more confident this was from anasarca from nephrotic syndrome, not gout. - Taper prednisone  Hypokalemia K finally normalized.  - Back off on K supplement  Hypomagnesemia Treated and resolevd  Normocytic anemia 7.1 on admission, trasnsfused 1 unit 9/21 Ferritin and iron saturation good within the last month.   Hgb stable   Stage III squamous cell carcinoma of right lung (HCC)  CAD (coronary artery disease) PVD Hypertension Hyperlipidemia BP elevated - Continue fenofibrate, metoprolol, Crestor - ContinueL asix - Hold  aspirin for biopsy  Type 2 diabetes mellitus with vascular disease (HCC) Glucose controlled - Continue sliding scale corrections           Subjective: Patient still has some swelling and a little weeping of his left arm today, no real change in swelling from yesterday although overall this is better than admission.  No fever, confusion, respiratory symptoms.     Physical Exam: BP (!) 152/97   Pulse 82   Temp 98.1 F (36.7 C) (Oral)   Resp 16   Wt 84.6 kg   SpO2 92%   BMI 26.76 kg/m   Adult male, lying in bed, appears pale and tired, but appropriate and interactive RRR, no murmurs, pitting edema in the arms and legs, anasarcic, JVP normal Respiratory rate normal, lungs clear without rales or wheezes Abdomen soft without tenderness palpation or guarding, no ascites or distention Attention normal, affect appropriate, judgment insight appear normal    Data Reviewed: Basic metabolic panel shows normalized potassium, creatinine stable at 2.3, calcium corrects to normal with albumin Complete blood count still anemic  Family Communication: Wife at the bedside    Disposition: Status is: Inpatient         Author: Edwin Dada, MD 04/15/2022 1:06 PM  For on call review [email or web address].

## 2022-04-15 NOTE — Progress Notes (Signed)
[facility] Progress Note   Assessment/ Plan:   Assessment/ Plan: Renal failure - subacute AKI in pt w/ lung cancer dx'd in May 2023. Renal function was normal during chemotherapy (carbo/ paclitaxel) which was 7 weeks from early June to late July. In late Auguest, creat bumped to 2.0.  He started immunotherapy then on 9/07, darvulumab, but per the family they had a hard time getting an IV in, to give him the 1st dose, due to all the swelling in his arms. Admitted now w/ creat up to 2.5, +nephrotic range proteinuria and microhematuria. Sediment shows rbc's somewhat dysmorphic, but no cast formations. Carboplatin can be nephrotoxic but causes ATN/ tubular injury not nephrotic syndrome. Darvulumab has been reported w/ neph syndrome, but his swelling and renal failure predated the 1st dose of darvulumab. May be paraneoplastic. 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    - IR consulted for biopsy, appreciate assistance  - continue IV Lasix today Hypokalemia/ hypomagnesemia - getting 40 po kcl tid again today w/ IV KCl and IV Mg.  Volume - has edema of LE/ UE's which is improving, CXR clear. Cont lasix as above.  Lung cancer - stage IIIb, sp chemoradiation through June + July. Then started immunoRx 1st dose on Sept 7.  CAD h/o CABG HTN - on metoprolol at home HL  Subjective:    Seen in room, wife at bedside.  Feeling OK.  Renal biopsy scheduled for tomorrow.     Objective:   BP 128/83 (BP Location: Right Arm)   Pulse 88   Temp 98.1 F (36.7 C) (Oral)   Resp 16   Wt 84.6 kg   SpO2 96%   BMI 26.76 kg/m   Physical Exam: Gen:NAD, lying in bed CVS: RRR Resp: clear anteriorly, muffled at bases Abd: soft, somewhat distended Ext: 1+ LE edema ACCESS: R port a cath  Labs: BMET Recent Labs  Lab 04/10/22 1035 04/10/22 1950 04/11/22 0547 04/12/22 0620 04/13/22 0629 04/14/22 0500 04/15/22 0630  NA 140 138 139 141 142 138 138  K 2.8* 3.2* 2.8* 3.1* 3.0* 3.0* 4.2   CL 104 106 109 109 107 102 103  CO2 26 23 24 26 28 29 27   GLUCOSE 104* 150* 119* 89 104* 95 87  BUN 15 17 18 20 22 21 22   CREATININE 2.54* 2.52* 2.62* 2.59* 2.35* 2.28* 2.31*  CALCIUM 7.0* 7.1* 6.8* 7.3* 7.2* 7.2* 7.3*   CBC Recent Labs  Lab 04/10/22 1035 04/10/22 1950 04/11/22 0547 04/12/22 0620 04/13/22 0629 04/15/22 0630  WBC 5.1 5.5 3.6* 3.9* 4.6 5.6  NEUTROABS 3.0 3.5  --   --   --  3.0  HGB 9.4* 9.3* 7.1* 9.1* 9.6* 10.2*  HCT 28.1* 29.9* 22.5* 28.5* 30.2* 32.8*  MCV 85.9 90.9 90.0 89.6 89.6 91.4  PLT 161.0 161 117* 137* 153 177      Medications:     brimonidine  1 drop Both Eyes BID   Chlorhexidine Gluconate Cloth  6 each Topical Daily   dorzolamide-timolol  1 drop Both Eyes BID   fenofibrate  160 mg Oral Daily   furosemide  80 mg Intravenous BID   insulin aspart  0-9 Units Subcutaneous TID WC   levothyroxine  50 mcg Oral Q0600   metoprolol tartrate  12.5 mg Oral BID   mupirocin ointment  1 Application Nasal BID   Netarsudil-Latanoprost  1 drop Ophthalmic QHS   [START ON 04/16/2022] potassium chloride  40 mEq Oral Daily   predniSONE  10 mg Oral Q breakfast   rosuvastatin  20 mg Oral Daily   sodium chloride flush  3 mL Intravenous Q12H     Madelon Lips MD 04/15/2022, 1:35 PM

## 2022-04-15 NOTE — Progress Notes (Signed)
Mobility Specialist - Progress Note   04/15/22 1154  Mobility  Activity Ambulated with assistance in hallway  Level of Assistance Standby assist, set-up cues, supervision of patient - no hands on  Assistive Device Front wheel walker  Distance Ambulated (ft) 250 ft  Activity Response Tolerated well  $Mobility charge 1 Mobility   Pt received in chair and agreed for mobility, no c/o pain nor discomfort. Pt to bed with all needs met and family in room.   Roderick Pee Mobility Specialist

## 2022-04-15 NOTE — Progress Notes (Signed)
Mobility Specialist - Progress Note   04/15/22 0908  Mobility  Activity Ambulated with assistance in hallway  Level of Assistance Standby assist, set-up cues, supervision of patient - no hands on  Assistive Device Front wheel walker  Distance Ambulated (ft) 100 ft  Activity Response Tolerated well  $Mobility charge 1 Mobility   Pt received in bed and agreed to mobility. Pt had a BM prior to ambulating in hallway. Pt fatigued from BM and legs tired so returned to bed early with all needs met and family in room.   Roderick Pee Mobility Specialist

## 2022-04-16 ENCOUNTER — Inpatient Hospital Stay (HOSPITAL_COMMUNITY): Payer: Medicare HMO

## 2022-04-16 DIAGNOSIS — N179 Acute kidney failure, unspecified: Secondary | ICD-10-CM | POA: Diagnosis not present

## 2022-04-16 LAB — BASIC METABOLIC PANEL
Anion gap: 6 (ref 5–15)
BUN: 27 mg/dL — ABNORMAL HIGH (ref 8–23)
CO2: 29 mmol/L (ref 22–32)
Calcium: 7.6 mg/dL — ABNORMAL LOW (ref 8.9–10.3)
Chloride: 101 mmol/L (ref 98–111)
Creatinine, Ser: 2.39 mg/dL — ABNORMAL HIGH (ref 0.61–1.24)
GFR, Estimated: 28 mL/min — ABNORMAL LOW (ref >60)
Glucose, Bld: 93 mg/dL (ref 70–99)
Potassium: 3.2 mmol/L — ABNORMAL LOW (ref 3.5–5.1)
Sodium: 136 mmol/L (ref 135–145)

## 2022-04-16 LAB — PROTIME-INR
INR: 1.1 (ref 0.8–1.2)
Prothrombin Time: 13.9 seconds (ref 11.4–15.2)

## 2022-04-16 LAB — PROTEIN ELECTROPHORESIS, SERUM
A/G Ratio: 0.6 — ABNORMAL LOW (ref 0.7–1.7)
Albumin ELP: 1.5 g/dL — ABNORMAL LOW (ref 2.9–4.4)
Alpha-1-Globulin: 0.3 g/dL (ref 0.0–0.4)
Alpha-2-Globulin: 0.7 g/dL (ref 0.4–1.0)
Beta Globulin: 0.8 g/dL (ref 0.7–1.3)
Gamma Globulin: 0.8 g/dL (ref 0.4–1.8)
Globulin, Total: 2.4 g/dL (ref 2.2–3.9)
Total Protein ELP: 3.9 g/dL — ABNORMAL LOW (ref 6.0–8.5)

## 2022-04-16 LAB — GLUCOSE, CAPILLARY
Glucose-Capillary: 91 mg/dL (ref 70–99)
Glucose-Capillary: 98 mg/dL (ref 70–99)
Glucose-Capillary: 188 mg/dL — ABNORMAL HIGH (ref 70–99)
Glucose-Capillary: 131 mg/dL — ABNORMAL HIGH (ref 70–99)

## 2022-04-16 LAB — CBC
HCT: 28.6 % — ABNORMAL LOW (ref 39.0–52.0)
Hemoglobin: 9.1 g/dL — ABNORMAL LOW (ref 13.0–17.0)
MCH: 28.8 pg (ref 26.0–34.0)
MCHC: 31.8 g/dL (ref 30.0–36.0)
MCV: 90.5 fL (ref 80.0–100.0)
Platelets: 155 10*3/uL (ref 150–400)
RBC: 3.16 MIL/uL — ABNORMAL LOW (ref 4.22–5.81)
RDW: 15.9 % — ABNORMAL HIGH (ref 11.5–15.5)
WBC: 4.8 10*3/uL (ref 4.0–10.5)
nRBC: 0 % (ref 0.0–0.2)

## 2022-04-16 LAB — ANTINUCLEAR ANTIBODIES, IFA: ANA Ab, IFA: NEGATIVE

## 2022-04-16 MED ORDER — FENTANYL CITRATE (PF) 100 MCG/2ML IJ SOLN
INTRAMUSCULAR | Status: AC
  Filled 2022-04-16: qty 2

## 2022-04-16 MED ORDER — GELATIN ABSORBABLE 12-7 MM EX MISC
CUTANEOUS | Status: AC
  Administered 2022-04-16: 1
  Filled 2022-04-16: qty 1

## 2022-04-16 MED ORDER — POTASSIUM CHLORIDE 20 MEQ PO PACK
40.0000 meq | PACK | Freq: Two times a day (BID) | ORAL | Status: DC
  Administered 2022-04-16 – 2022-04-17 (×3): 40 meq via ORAL
  Filled 2022-04-16 (×4): qty 2

## 2022-04-16 MED ORDER — MIDAZOLAM HCL 2 MG/2ML IJ SOLN
INTRAMUSCULAR | Status: AC
  Filled 2022-04-16: qty 2

## 2022-04-16 MED ORDER — TORSEMIDE 20 MG PO TABS
20.0000 mg | ORAL_TABLET | Freq: Every day | ORAL | Status: DC
  Administered 2022-04-16: 20 mg via ORAL
  Filled 2022-04-16: qty 1

## 2022-04-16 MED ORDER — TORSEMIDE 20 MG PO TABS
80.0000 mg | ORAL_TABLET | Freq: Two times a day (BID) | ORAL | Status: DC
  Administered 2022-04-16 – 2022-04-17 (×2): 80 mg via ORAL
  Filled 2022-04-16 (×3): qty 4

## 2022-04-16 MED ORDER — LIDOCAINE HCL 1 % IJ SOLN
INTRAMUSCULAR | Status: AC
  Administered 2022-04-16: 10 mL
  Filled 2022-04-16: qty 20

## 2022-04-16 NOTE — Procedures (Signed)
Interventional Radiology Procedure Note  Procedure: Ultrasound guided renal biopsy  Findings: Please refer to procedural dictation for full description. Right inferior pole 16 ga core x 2.   Gelfoam slurry needle track embolization.  Complications: None immediate  Estimated Blood Loss: < 5 mL  Recommendations: Strict 3 hour bedrest. Follow up Pathology results.   Ruthann Cancer, MD

## 2022-04-16 NOTE — Progress Notes (Signed)
PT Cancellation Note  Patient Details Name: VESTAL MARKIN MRN: [id number] DOB: [birth year]   Cancelled Treatment:    Reason Eval/Treat Not Completed: PT screened, no needs identified, will sign off Pt screened by PT on 9/24.  No needs identified.  Pt also ambulated good distance with mobility specialist yesterday.  PT to sign off.   Kati L Payson 04/16/2022, 9:05 AM Arlyce Dice, DPT Physical Therapist [facility] Preferred contact method: Secure Chat Weekend Pager Only: [phone] Office: [phone]

## 2022-04-16 NOTE — Progress Notes (Signed)
Mobility Specialist - Progress Note   04/16/22 1454  Mobility  HOB Elevated/Bed Position Self regulated  Activity Ambulated with assistance in hallway  Range of Motion/Exercises Active  Level of Assistance Minimal assist, patient does 75% or more  Assistive Device Front wheel walker  Distance Ambulated (ft) 125 ft  Activity Response Tolerated well  Transport method Ambulatory  $Mobility charge 1 Mobility   Pt received in bed and agreeable to mobility. No complaints during ambulation. Upon returning assisted pt w/ bathroom ADLs.  Pt to bed after session with all needs met.    Maya  Mobility Specialist  

## 2022-04-16 NOTE — Progress Notes (Signed)
Mobility Specialist - Progress Note   04/16/22 1011  Mobility  HOB Elevated/Bed Position Self regulated  Activity Ambulated with assistance in hallway  Range of Motion/Exercises Active  Level of Assistance Contact guard assist, steadying assist  Assistive Device Front wheel walker  Distance Ambulated (ft) 140 ft  Activity Response Tolerated well  Transport method Ambulatory  $Mobility charge 1 Mobility   Pt received in bed and agreeable to mobility. C/o of feeling weak towards EOS.  Pt to bed after session with all needs met.    [facility]

## 2022-04-16 NOTE — Progress Notes (Signed)
Progress Note   Patient: Jorge Adams OVF:[id number] DOB: 25-Jan-1951 DOA: 04/10/2022     5 DOS: the patient was seen and examined on 04/16/2022 at 11:50AM      Brief hospital course: Jorge Adams is a 71 y.o. M with squamous lung CA T3N2M0, recently completed carboplatin/paclitaxel and radiation now on durvalumab this month, DM, HTN, HLD, and recently diagnosed hypothyroidism who presented with edema and elevated BNP.  Went to PCP few days ago for week or more of progressive SOB and edema.  PCP found AKI with Cr 2.3, BNP >300 and called and referred to the ER.  In the ER, CXR showed stable RLL opacity, maybe some superimposed edema.  Cr 2.5, troponin 21.      9/20: Admitted on Lasix 9/21: Cr worse, Nephrology and Cardiology consulted. Prednisone started 9/22: Port to be placed for improved access 9/24: Cr improving 9/26: Renal biopsy done     Assessment and Plan: * AKI (acute kidney injury) ([place]) Cr timing increase is over weeks.   Baseline Cr 0.7-0.9 Date     SCr 8/7:       0.72 8/29:     2.05 9/6:       2.26 (edema noted) 9/7: Started Imfinzi 9/19:     2.54 (on admission here)  Urine relatively acellular, has granular casts. P/C ratio >8, has marked edema, albumin <1.5  ANCA, serum light chain ratio, Anti-GBM Ab and complements normal.  Cr has stabilized with diuresis at 2.3.   - Transition to torsemide - Monitor overnight after biopsy - Outpatient follow up with Nephrology - Follow SPEP, ANA outpatient     Hypocalcemia Corrects to low normal  Gout PCP started gout for left wrist swelling. Now feel more confident this was from anasarca from nephrotic syndrome, not gout. - Tapered off today  Hypokalemia Requiring high doses of supplements. - Continue K  Hypomagnesemia Supplemented  Normocytic anemia 7.1 on admission, trasnsfused 1 unit 9/21 Up to 9.1 today. Normocytic.  Ferritin and iron saturation good within the last month.  Suspect it is  chemotherapy related.    Stage III squamous cell carcinoma of right lung (HCC) Jorge Adams has been added to the treatment team.  Currently finished one cycle Durvalumab.  CAD (coronary artery disease) - Continue fenofibrate, metoprolol, Crestor - Hold aspirin for biopsy  PAD (peripheral artery disease) (HCC) - Hold aspirin - Continue fenofibrate and Crestor  Type 2 diabetes mellitus with vascular disease (HCC) Glucose controlled - Continue sliding scale corrections  Hyperlipidemia LDL goal <70 - Continue fenofibrate, Crestor  Hypertension associated with diabetes ([place]) Blood pressure controlled - Continue metoprolol          Subjective: Feeling well.  Swelling no change.  A little weeping of the arm again.  No back pain after biopsy.     Physical Exam: BP 130/74 (BP Location: Right Arm)   Pulse 74   Temp 98.2 F (36.8 C) (Oral)   Resp 18   Wt 84.6 kg   SpO2 95%   BMI 26.76 kg/m   Elderly adult male, lying in bed, sleeping, arouses easily and is interactive RRR, no murmurs, marked peripheral edema, unchanged from yesterday Respiratory rate normal, lungs clear without rales or wheezes Abdomen soft without tenderness palpation or guarding Attention normal, affect normal, sleepy and goes back to sleep after our conversation, face metric, speech fluent    Data Reviewed: Basic metabolic panel shows creatinine slightly up to 2.3 Serum free light chain ratio normal ANCA  negative Potassium down to 3.2 Hemoglobin 9.1 and stable Glucose improved   Family Communication: Wife at the bedside    Disposition: Status is: Inpatient 71 year old male presented with nephrotic syndrome.  Had renal biopsy Tuesday, if stable Wednesday can go home with Nephroloy follow up        Author: Edwin Dada, MD 04/16/2022 7:29 PM  For on call review [email or web address].

## 2022-04-16 NOTE — Progress Notes (Signed)
[facility] Progress Note   Assessment/ Plan:   Assessment/ Plan: Renal failure - subacute AKI in pt w/ lung cancer dx'd in May 2023. Renal function was normal during chemotherapy (carbo/ paclitaxel) which was 7 weeks from early June to late July. In late Auguest, creat bumped to 2.0.  He started immunotherapy then on 9/07, darvulumab, but per the family they had a hard time getting an IV in, to give him the 1st dose, due to all the swelling in his arms. Admitted now w/ creat up to 2.5, +nephrotic range proteinuria and microhematuria. Sediment shows rbc's somewhat dysmorphic, but no cast formations. Carboplatin can be nephrotoxic but causes ATN/ tubular injury not nephrotic syndrome. Darvulumab has been reported w/ neph syndrome, but his swelling and renal failure predated the 1st dose of darvulumab. May be paraneoplastic. 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    - IR consulted for biopsy, appreciate assistance- occurred today  - switched to PO torsemide 80 mg BID  - potentially home tomorrow with folllowup with me to discuss biopsy Hypokalemia/ hypomagnesemia - getting 40 po kcl tid again today w/ IV KCl and IV Mg.  Volume - has edema of LE/ UE's which is improving, CXR clear. Cont lasix as above.  Lung cancer - stage IIIb, sp chemoradiation through June + July. Then started immunoRx 1st dose on Sept 7.  CAD h/o CABG HTN - on metoprolol at home HL  Subjective:    S/p renal biopsy- appreciate IR assistance.      Objective:   BP 129/77 (BP Location: Left Arm)   Pulse 69   Temp 98.6 F (37 C) (Oral)   Resp 12   Wt 84.6 kg   SpO2 100%   BMI 26.76 kg/m   Physical Exam: Gen: NAD, lying in bed CVS: RRR Resp: clear anteriorly, muffled at bases Abd: soft, somewhat distended Ext: 1+ LE edema ACCESS: R port a cath  Labs: BMET Recent Labs  Lab 04/10/22 1950 04/11/22 0547 04/12/22 0620 04/13/22 0629 04/14/22 0500 04/15/22 0630 04/16/22 0700  NA 138  139 141 142 138 138 136  K 3.2* 2.8* 3.1* 3.0* 3.0* 4.2 3.2*  CL 106 109 109 107 102 103 101  CO2 23 24 26 28 29 27 29   GLUCOSE 150* 119* 89 104* 95 87 93  BUN 17 18 20 22 21 22  27*  CREATININE 2.52* 2.62* 2.59* 2.35* 2.28* 2.31* 2.39*  CALCIUM 7.1* 6.8* 7.3* 7.2* 7.2* 7.3* 7.6*   CBC Recent Labs  Lab 04/10/22 1035 04/10/22 1950 04/11/22 0547 04/12/22 0620 04/13/22 0629 04/15/22 0630 04/16/22 0700  WBC 5.1 5.5   < > 3.9* 4.6 5.6 4.8  NEUTROABS 3.0 3.5  --   --   --  3.0  --   HGB 9.4* 9.3*   < > 9.1* 9.6* 10.2* 9.1*  HCT 28.1* 29.9*   < > 28.5* 30.2* 32.8* 28.6*  MCV 85.9 90.9   < > 89.6 89.6 91.4 90.5  PLT 161.0 161   < > 137* 153 177 155   < > = values in this interval not displayed.      Medications:     brimonidine  1 drop Both Eyes BID   Chlorhexidine Gluconate Cloth  6 each Topical Daily   dorzolamide-timolol  1 drop Both Eyes BID   fenofibrate  160 mg Oral Daily   fentaNYL       insulin aspart  0-9 Units Subcutaneous TID WC   levothyroxine  50  mcg Oral Q0600   metoprolol tartrate  12.5 mg Oral BID   midazolam       mupirocin ointment  1 Application Nasal BID   Netarsudil-Latanoprost  1 drop Ophthalmic QHS   potassium chloride  40 mEq Oral BID   predniSONE  10 mg Oral Q breakfast   rosuvastatin  20 mg Oral Daily   sodium chloride flush  3 mL Intravenous Q12H   torsemide  80 mg Oral BID     Madelon Lips MD 04/16/2022, 1:44 PM

## 2022-04-17 ENCOUNTER — Other Ambulatory Visit: Payer: Self-pay | Admitting: Family Medicine

## 2022-04-17 ENCOUNTER — Ambulatory Visit (HOSPITAL_COMMUNITY): Payer: Medicare HMO

## 2022-04-17 ENCOUNTER — Other Ambulatory Visit (HOSPITAL_COMMUNITY): Payer: Medicare HMO

## 2022-04-17 ENCOUNTER — Other Ambulatory Visit: Payer: Self-pay | Admitting: Cardiology

## 2022-04-17 DIAGNOSIS — E876 Hypokalemia: Secondary | ICD-10-CM | POA: Diagnosis not present

## 2022-04-17 DIAGNOSIS — E7800 Pure hypercholesterolemia, unspecified: Secondary | ICD-10-CM

## 2022-04-17 DIAGNOSIS — I2510 Atherosclerotic heart disease of native coronary artery without angina pectoris: Secondary | ICD-10-CM | POA: Diagnosis not present

## 2022-04-17 DIAGNOSIS — N179 Acute kidney failure, unspecified: Secondary | ICD-10-CM | POA: Diagnosis not present

## 2022-04-17 DIAGNOSIS — E785 Hyperlipidemia, unspecified: Secondary | ICD-10-CM | POA: Diagnosis not present

## 2022-04-17 LAB — BASIC METABOLIC PANEL
Anion gap: 7 (ref 5–15)
BUN: 29 mg/dL — ABNORMAL HIGH (ref 8–23)
CO2: 29 mmol/L (ref 22–32)
Calcium: 7.5 mg/dL — ABNORMAL LOW (ref 8.9–10.3)
Chloride: 102 mmol/L (ref 98–111)
Creatinine, Ser: 2.41 mg/dL — ABNORMAL HIGH (ref 0.61–1.24)
GFR, Estimated: 28 mL/min — ABNORMAL LOW (ref >60)
Glucose, Bld: 89 mg/dL (ref 70–99)
Potassium: 3.5 mmol/L (ref 3.5–5.1)
Sodium: 138 mmol/L (ref 135–145)

## 2022-04-17 LAB — CBC
HCT: 27.4 % — ABNORMAL LOW (ref 39.0–52.0)
Hemoglobin: 8.6 g/dL — ABNORMAL LOW (ref 13.0–17.0)
MCH: 28.8 pg (ref 26.0–34.0)
MCHC: 31.4 g/dL (ref 30.0–36.0)
MCV: 91.6 fL (ref 80.0–100.0)
Platelets: 145 10*3/uL — ABNORMAL LOW (ref 150–400)
RBC: 2.99 MIL/uL — ABNORMAL LOW (ref 4.22–5.81)
RDW: 15.7 % — ABNORMAL HIGH (ref 11.5–15.5)
WBC: 4.4 10*3/uL (ref 4.0–10.5)
nRBC: 0 % (ref 0.0–0.2)

## 2022-04-17 LAB — GLUCOSE, CAPILLARY
Glucose-Capillary: 89 mg/dL (ref 70–99)
Glucose-Capillary: 147 mg/dL — ABNORMAL HIGH (ref 70–99)

## 2022-04-17 MED ORDER — TORSEMIDE 20 MG PO TABS: 80.0000 mg | ORAL_TABLET | Freq: Two times a day (BID) | ORAL | 1 refills | Status: DC

## 2022-04-17 MED ORDER — HEPARIN SOD (PORK) LOCK FLUSH 100 UNIT/ML IV SOLN
500.0000 [IU] | Freq: Once | INTRAVENOUS | Status: AC
  Administered 2022-04-17: 500 [IU] via INTRAVENOUS
  Filled 2022-04-17: qty 5

## 2022-04-17 MED ORDER — POTASSIUM CHLORIDE ER 10 MEQ PO TBCR: 40.0000 meq | EXTENDED_RELEASE_TABLET | Freq: Two times a day (BID) | ORAL | 1 refills | Status: DC

## 2022-04-17 NOTE — Progress Notes (Signed)
[facility] Progress Note   Assessment/ Plan:   Assessment/ Plan: Renal failure - subacute AKI in pt w/ lung cancer dx'd in May 2023. Renal function was normal during chemotherapy (carbo/ paclitaxel) which was 7 weeks from early June to late July. In late Auguest, creat bumped to 2.0.  He started immunotherapy then on 9/07, darvulumab, but per the family they had a hard time getting an IV in, to give him the 1st dose, due to all the swelling in his arms. Admitted now w/ creat up to 2.5, +nephrotic range proteinuria and microhematuria. Sediment shows rbc's somewhat dysmorphic, but no cast formations. Carboplatin can be nephrotoxic but causes ATN/ tubular injury not nephrotic syndrome. Darvulumab has been reported w/ neph syndrome, but his swelling and renal failure predated the 1st dose of darvulumab. May be paraneoplastic. 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    - IR consulted for biopsy, appreciate assistance- occurred 04/16/22  - switched to PO torsemide 80 mg BID  - on K supps- continue  - OK from my perspective to d/c today on PO torsemide and K supps with close followup in clinic in 1-2 weeks (I have already requested the appt) to discuss plan.  I told pt and wife that if the biopsy result needed immediate attention in the interim then I would call them to let them know  - needs to d/c on a baby ASA 81 mg daily as well.  If albumin remains low then we will need to discuss empiric anticoagulation  Hypokalemia/ hypomagnesemia - repleting Volume - has edema of LE/ UE's which is improving, CXR clear. Cont lasix as above.  Lung cancer - stage IIIb, sp chemoradiation through June + July. Then started immunoRx 1st dose on Sept 7.  CAD h/o CABG HTN - on metoprolol at home HL  Subjective:   Seen in room.  Feeling OK.  Contineus to improve in regards to volume status.     Objective:   BP (!) 157/99 (BP Location: Right Arm)   Pulse 76   Temp 98.7 F (37.1 C) (Oral)    Resp 18   Wt 82.5 kg   SpO2 96%   BMI 26.10 kg/m   Physical Exam: Gen: NAD, lying in bed CVS: RRR Resp: clear anteriorly, basilar breath sounds improving Abd: soft, distention improved Ext: 1+ LE edema ACCESS: R port a cath  Labs: BMET Recent Labs  Lab 04/11/22 0547 04/12/22 0620 04/13/22 0629 04/14/22 0500 04/15/22 0630 04/16/22 0700 04/17/22 0514  NA 139 141 142 138 138 136 138  K 2.8* 3.1* 3.0* 3.0* 4.2 3.2* 3.5  CL 109 109 107 102 103 101 102  CO2 24 26 28 29 27 29 29   GLUCOSE 119* 89 104* 95 87 93 89  BUN 18 20 22 21 22  27* 29*  CREATININE 2.62* 2.59* 2.35* 2.28* 2.31* 2.39* 2.41*  CALCIUM 6.8* 7.3* 7.2* 7.2* 7.3* 7.6* 7.5*   CBC Recent Labs  Lab 04/10/22 1950 04/11/22 0547 04/13/22 0629 04/15/22 0630 04/16/22 0700 04/17/22 0514  WBC 5.5   < > 4.6 5.6 4.8 4.4  NEUTROABS 3.5  --   --  3.0  --   --   HGB 9.3*   < > 9.6* 10.2* 9.1* 8.6*  HCT 29.9*   < > 30.2* 32.8* 28.6* 27.4*  MCV 90.9   < > 89.6 91.4 90.5 91.6  PLT 161   < > 153 177 155 145*   < > = values in this interval  not displayed.      Medications:     brimonidine  1 drop Both Eyes BID   Chlorhexidine Gluconate Cloth  6 each Topical Daily   dorzolamide-timolol  1 drop Both Eyes BID   fenofibrate  160 mg Oral Daily   insulin aspart  0-9 Units Subcutaneous TID WC   levothyroxine  50 mcg Oral Q0600   metoprolol tartrate  12.5 mg Oral BID   Netarsudil-Latanoprost  1 drop Ophthalmic QHS   potassium chloride  40 mEq Oral BID   rosuvastatin  20 mg Oral Daily   sodium chloride flush  3 mL Intravenous Q12H   torsemide  80 mg Oral BID     Madelon Lips MD 04/17/2022, 1:11 PM

## 2022-04-17 NOTE — Discharge Summary (Signed)
Physician Discharge Summary  ABSHIR PAOLINI VVZ:[id number] DOB: Dec 28, 1950 DOA: 04/10/2022  PCP: [name], MD  Admit date: 04/10/2022 Discharge date: 04/17/2022  Time spent: 55 minutes  Recommendations for Outpatient Follow-up:  Follow-up with [name], MD in 2 weeks.  On follow-up patient will need a basic metabolic profile, magnesium level done to follow-up on electrolytes and renal function.  Patient will need a CBC done to follow-up on counts. Follow-up with Dr. Hollie Salk, nephrology.  Office will call with an appointment time.  On follow-up biopsy results need to be followed up upon as well as further recommendations for probable nephrotic syndrome. Follow-up with Dr. Lorna Few, oncology as scheduled 04/24/2022.   Discharge Diagnoses:  Principal Problem:   AKI (acute kidney injury) ([place]) Active Problems:   Hypertension associated with diabetes ([place])   Hyperlipidemia LDL goal <70   Type 2 diabetes mellitus with vascular disease ([place])   PAD (peripheral artery disease) ([place])   CAD (coronary artery disease)   Stage III squamous cell carcinoma of right lung (HCC)   Normocytic anemia   Hypomagnesemia   Hypokalemia   Gout   Hypocalcemia   Discharge Condition: Stable and improved.  Diet recommendation: Heart healthy  Filed Weights   04/14/22 0500 04/15/22 0500 04/17/22 0619  Weight: 83.2 kg 84.6 kg 82.5 kg    History of present illness:  HPI per Dr. Tye Maryland is a 71 y.o. male with medical history significant of stage III lung cancer status post radiation and chemotherapy now on immunotherapy, hypothyroidism, hypertension, diabetes, hyperlipidemia, obesity, PAD, CAD, borderline glaucoma, cervical spine stenosis, anemia presenting with abnormal labs with PCP in the setting of ongoing shortness of breath and edema.   Patient has had a week of shortness of breath and edema.  Had had some fatigue previously and was recently diagnosed with  hypothyroidism when his TSH came back at 8.  He was started on Synthroid but has not had an improvement in his symptoms with this fact have worsening fatigue/shortness of breath.  As above also with associated lower extremity edema that has become more diffuse.   He was evaluated at PCP office today and had lab work done noted to have elevated creatinine, low potassium, elevated BNP sent to the ED for further evaluation.   Of note is currently being treated for lung cancer as above.   He denies fevers, chills, chest pain, abdominal pain, constipation, diarrhea, nausea, vomiting.   ED Course: Vital signs in the ED significant for blood pressure in the 544B 201E systolic.  Respiratory rate in the teens to 20s.  Lab work-up included CMP with potassium 3.2, creatinine 2.5 up from 2 3 weeks ago and one 1 month ago, glucose 150, calcium 7.1, protein 5.2, albumin 1.7.  CBC with hemoglobin stable at 9.3.  Troponin pending.  No BNP in the ED however BNP checked outpatient was elevated to 575 up from already being elevated at 329 days ago.  Urinalysis pending here outpatient showed bilirubin, hemoglobin, white cells, rare bacteria, granular and hyaline cast.  Magnesium level 1.4 in ED.  Chest x-ray pending.  Patient received Norco, Lasix, 40 mEq of p.o. potassium and 20 mEq IV potassium, 1 g IV magnesium. Hospital Course:   Assessment and Plan: * AKI (acute kidney injury) ([place]) Cr timing increase is over weeks.   Baseline Cr 0.7-0.9 Date     SCr 8/7:       0.72 8/29:     2.05 9/6:  2.26 (edema noted) 9/7: Started Imfinzi 9/19:     2.54 (on admission here)  Urine relatively acellular, has granular casts. P/C ratio >8, has marked edema, albumin <1.5  ANCA, serum light chain ratio, Anti-GBM Ab and complements normal.  Cr has stabilized with diuresis at 2.3.   -#2 consulted and followed the patient throughout the hospitalization. -Patient initially was on IV diuretics and subsequently transition  to oral torsemide per nephrology recommendations. -IR was consulted and renal biopsy obtained 04/16/2022 without any complications. - Outpatient follow up with Nephrology - Follow SPEP, ANA outpatient -Patient improved clinically and will be discharged on oral torsemide as well as oral potassium supplementation. -Outpatient follow-up with nephrology and PCP.     Hypocalcemia Corrects to low normal -Outpatient follow-up with hematology/oncology.  Gout PCP started gout for left wrist swelling. Now feel more confident this was from anasarca from nephrotic syndrome, not gout. - Tapered off. -Outpatient follow-up.  Hypokalemia Requiring high doses of supplements. -Patient maintained on oral potassium supplementation during the hospitalization which she will be discharged on. -Outpatient follow-up with PCP and nephrology.  Hypomagnesemia Supplemented  Normocytic anemia 7.1 on admission, trasnsfused 1 unit 9/21 Normocytic.  Ferritin and iron saturation good within the last month.  Suspect it is chemotherapy related. -Hemoglobin stabilized at 8.6 by day of discharge. -Outpatient follow-up with hematology/oncology and PCP.    Stage III squamous cell carcinoma of right lung (HCC) Dr. Earlie Server was added to the treatment team.  Currently finished one cycle Durvalumab. -Outpatient follow-up with oncology, Dr. Lorna Few.  CAD (coronary artery disease) - Patient was maintained on home regimen fenofibrate, metoprolol, Crestor. - Aspirin held for biopsy and will be resumed on discharge.    PAD (peripheral artery disease) (HCC) - Aspirin held during the hospitalization, patient maintained on home regimen fenofibrate and Crestor.   -Aspirin 81 mg will be resumed on discharge.  -Outpatient follow-up with PCP.  Type 2 diabetes mellitus with vascular disease (HCC) Glucose controlled - Patient maintained on sliding scale insulin during the hospitalization.   Hyperlipidemia LDL goal  <70 - Patient maintained on home regimen fenofibrate, Crestor  Hypertension associated with diabetes ([place]) Blood pressure controlled on home regimen metoprolol which patient was maintained on. -Outpatient follow-up.        Procedures: Ultrasound-guided renal biopsy per IR, Dr. Serafina Royals 04/16/2022 Right IJ Port-A-Cath placement by IR: Dr. Vernard Gambles 04/12/2022 Chest x-ray 04/10/2022 Renal ultrasound 04/11/2022  Consultations: Nephrology: Dr.Schertz 04/11/2022  Discharge Exam: Vitals:   04/17/22 0619 04/17/22 0958  BP: 137/74 (!) 157/99  Pulse: 75 76  Resp: 14 18  Temp: 98.2 F (36.8 C) 98.7 F (37.1 C)  SpO2: 91% 96%    General: NAD Cardiovascular: RRR no murmurs rubs or gallops.  No JVD.  Trace lower extremity edema. Respiratory: Lungs clear to auscultation bilaterally.  No wheezes, no crackles, no rhonchi.  Fair air movement.  Speaking in full sentences.  Discharge Instructions   Discharge Instructions     Diet - low sodium heart healthy   Complete by: As directed    Discharge wound care:   Complete by: As directed    Pressure Injury 02/22/22 Sacrum Upper Stage 2 -  Partial thickness loss of dermis presenting as a shallow open injury with a red, pink wound bed without slough. Pink wound bed   Increase activity slowly   Complete by: As directed       Allergies as of 04/17/2022       Reactions   Metformin And  Related    Held due to creatinine elevation September 2023.        Medication List     STOP taking these medications    potassium chloride SA 20 MEQ tablet Commonly known as: KLOR-CON M   predniSONE 10 MG tablet Commonly known as: DELTASONE       TAKE these medications    albuterol 108 (90 Base) MCG/ACT inhaler Commonly known as: VENTOLIN HFA Inhale 1-2 puffs into the lungs every 6 (six) hours as needed (for cough.  okay to fill with albuterol/proair/ventolin.). What changed:  how much to take when to take this reasons to take this    aspirin EC 81 MG tablet Take 1 tablet (81 mg total) by mouth daily. Okay to restart this medication on 12/05/2021.   blood glucose meter kit and supplies Dispense based on pt and insurance preference. Use up to 3 times daily as directed. (Dx. E11.9).   brimonidine 0.1 % Soln Commonly known as: ALPHAGAN P Place 2 drops into both eyes 2 (two) times daily.   cilostazol 100 MG tablet Commonly known as: PLETAL TAKE 1 TABLET BY MOUTH TWICE A DAY What changed: additional instructions   dorzolamide-timolol 22.3-6.8 MG/ML ophthalmic solution Commonly known as: COSOPT Place 1 drop into both eyes 2 (two) times daily.   fenofibrate 145 MG tablet Commonly known as: TRICOR TAKE 1 TABLET BY MOUTH EVERY DAY IN THE EVENING What changed:  how much to take how to take this when to take this   HYDROcodone bit-homatropine 5-1.5 MG/5ML syrup Commonly known as: HYCODAN Take 5 mLs by mouth every 8 (eight) hours as needed for cough (sedation caution.).   Integra Plus Caps Take 1 capsule by mouth every morning.   ipratropium-albuterol 0.5-2.5 (3) MG/3ML Soln Commonly known as: DUONEB Take 3 mLs by nebulization every 6 (six) hours as needed.   levothyroxine 50 MCG tablet Commonly known as: Synthroid Take 1 tablet (50 mcg total) by mouth daily before breakfast.   metoprolol tartrate 25 MG tablet Commonly known as: LOPRESSOR Take 0.5 tablets (12.5 mg total) by mouth 2 (two) times daily. Hold for SBP less than 110 What changed: how much to take   nitroGLYCERIN 0.4 MG SL tablet Commonly known as: NITROSTAT Place 1 tablet (0.4 mg total) under the tongue every 5 (five) minutes as needed for chest pain.   omega-3 acid ethyl esters 1 g capsule Commonly known as: LOVAZA TAKE 1 CAPSULE BY MOUTH TWICE A DAY   potassium chloride 10 MEQ tablet Commonly known as: KLOR-CON Take 4 tablets (40 mEq total) by mouth 2 (two) times daily.   prochlorperazine 10 MG tablet Commonly known as: COMPAZINE Take  1 tablet (10 mg total) by mouth every 6 (six) hours as needed for nausea or vomiting. What changed: when to take this   [place] 0.02-0.005 % Soln Generic drug: Netarsudil-Latanoprost Apply 1 drop to eye at bedtime. In both eyes   rosuvastatin 20 MG tablet Commonly known as: CRESTOR TAKE 1 TABLET BY MOUTH EVERY DAY   Sphygmomanometer Misc Use daily to check BP.  Dx I10   torsemide 20 MG tablet Commonly known as: DEMADEX Take 4 tablets (80 mg total) by mouth 2 (two) times daily.   VITAMIN B12 PO Take 1 tablet by mouth in the morning and at bedtime.               Discharge Care Instructions  (From admission, onward)           Start  Ordered   04/17/22 0000  Discharge wound care:       Comments: Pressure Injury 02/22/22 Sacrum Upper Stage 2 -  Partial thickness loss of dermis presenting as a shallow open injury with a red, pink wound bed without slough. Pink wound bed   04/17/22 1405           Allergies  Allergen Reactions   Metformin And Related     Held due to creatinine elevation September 2023.    Follow-up Information     [name], MD. Schedule an appointment as soon as possible for a visit in 2 week(s).   Specialty: Family Medicine Contact information: [place] [address] [phone]         Madelon Lips, MD Follow up.   Specialty: Nephrology Why: Office will call with appointment time. Contact information: [place] [address] [phone]         Curt Bears, MD Follow up on 04/24/2022.   Specialty: Oncology Why: Follow-up as scheduled. Contact information: [place] [address] [phone]                  The results of significant diagnostics from this hospitalization (including imaging, microbiology, ancillary and laboratory) are listed below for reference.    Significant Diagnostic Studies: US BIOPSY (KIDNEY)  Result Date:  04/16/2022 INDICATION: 71 year old male with history of acute kidney injury of indeterminate etiology. EXAM: ULTRASOUND GUIDED RENAL BIOPSY COMPARISON:  None Available. MEDICATIONS: None. ANESTHESIA/SEDATION: Fentanyl 50 mcg IV; Versed 1 mg IV Total Moderate Sedation time: 11 minutes; The patient was continuously monitored during the procedure by the interventional radiology nurse under my direct supervision. COMPLICATIONS: None immediate. PROCEDURE: Informed written consent was obtained from the patient after a discussion of the risks, benefits and alternatives to treatment. The patient understands and consents the procedure. A timeout was performed prior to the initiation of the procedure. Ultrasound scanning was performed of the bilateral flanks. The inferior pole of the right kidney was selected for biopsy due to location and sonographic window. The procedure was planned. The operative site was prepped and draped in the usual sterile fashion. The overlying soft tissues were anesthetized with 1% lidocaine with epinephrine. A 16 gauge core needle biopsy device was advanced into the inferior cortex of the right kidney and 2 core biopsies were obtained under direct ultrasound guidance. Images were saved for documentation purposes. The biopsy device was removed while injecting Gel-Foam slurry under ultrasound guidance and hemostasis was obtained with brief manual compression. Post procedural scanning was negative for significant post procedural hemorrhage or additional complication. A dressing was placed. The patient tolerated the procedure well without immediate post procedural complication. IMPRESSION: Technically successful ultrasound guided right renal biopsy. Ruthann Cancer, MD [facility] Radiology Electronically Signed   By: Ruthann Cancer M.D.   On: 04/16/2022 11:52   IR IMAGING GUIDED PORT INSERTION  Result Date: 04/12/2022 CLINICAL DATA:  Lung carcinoma,  needs durable venous access for treatment regimen EXAM: TUNNELED PORT CATHETER PLACEMENT WITH ULTRASOUND AND FLUOROSCOPIC GUIDANCE FLUOROSCOPY: Radiation Exposure Index (as provided by the fluoroscopic device): 5 mGy air Kerma ANESTHESIA/SEDATION: Intravenous Fentanyl 107mg and Versed 161mwere administered as conscious sedation during continuous monitoring of the patient's level of consciousness and physiological / cardiorespiratory status by the radiology RN, with a total moderate sedation time of 17 minutes. TECHNIQUE: The procedure, risks, benefits, and alternatives were explained to the patient. Questions  regarding the procedure were encouraged and answered. The patient understands and consents to the procedure. Patency of the right IJ vein was confirmed with ultrasound with image documentation. An appropriate skin site was determined. Skin site was marked. Region was prepped using maximum barrier technique including cap and mask, sterile gown, sterile gloves, large sterile sheet, and Chlorhexidine as cutaneous antisepsis. The region was infiltrated locally with 1% lidocaine. Under real-time ultrasound guidance, the right IJ vein was accessed with a 21 gauge micropuncture needle; the needle tip within the vein was confirmed with ultrasound image documentation. Needle was exchanged over a 018 guidewire for transitional dilator, and vascular measurement was performed. A small incision was made on the right anterior chest wall and a subcutaneous pocket fashioned. The power-injectable port was positioned and its catheter tunneled to the right IJ dermatotomy site. The transitional dilator was exchanged over an Amplatz wire for a peel-away sheath, through which the port catheter, which had been trimmed to the appropriate length, was advanced and positioned under fluoroscopy with its tip at the cavoatrial junction. Spot chest radiograph confirms good catheter position and no pneumothorax. The port was flushed per  protocol. The pocket was closed with deep interrupted and subcuticular continuous 3-0 Monocryl sutures. The incisions were covered with Dermabond then covered with a sterile dressing. The patient tolerated the procedure well. COMPLICATIONS: COMPLICATIONS None immediate IMPRESSION: Technically successful right IJ power-injectable port catheter placement. Ready for routine use. Electronically Signed   By: Lucrezia Europe M.D.   On: 04/12/2022 16:15   DG Chest 2 View  Result Date: 04/11/2022 CLINICAL DATA:  Cough EXAM: CHEST - 2 VIEW COMPARISON:  04/01/2022 FINDINGS: Cardiac shadow is enlarged but stable. Postsurgical changes are again noted. Patchy airspace opacity is again noted in the right base stable in appearance from the prior exam and as well as a prior CT examination from August of 2023. The known cavitary neoplasm is somewhat obscured by the adjacent infiltrates. No bony abnormality is noted. Left lung is clear. IMPRESSION: Persistent right basilar infiltrate and cavitary mass. Electronically Signed   By: Inez Catalina M.D.   On: 04/11/2022 22:51   US RENAL  Result Date: 04/11/2022 CLINICAL DATA:  Acute renal injury EXAM: RENAL / URINARY TRACT ULTRASOUND COMPLETE COMPARISON:  None Available. FINDINGS: Right Kidney: Renal measurements: 12.8 x 6.2 x 5.9 cm = volume: 270 mL. Echogenicity within normal limits. No mass or hydronephrosis visualized. Left Kidney: Renal measurements: 14.4 x 7.3 x 5.9 cm = volume: 323 mL.  3 Bladder: Appears normal for degree of bladder distention. Other: None. IMPRESSION: 1. Normal renal ultrasound. 2. Normal bladder. Electronically Signed   By: Suzy Bouchard M.D.   On: 04/11/2022 15:52   DG Chest Port 1 View  Result Date: 04/10/2022 CLINICAL DATA:  Short of breath, hypokalemia EXAM: PORTABLE CHEST 1 VIEW COMPARISON:  04/10/2022, 04/01/2022 FINDINGS: Single frontal view of the chest demonstrates a stable cardiac silhouette. Postsurgical changes from CABG. Continued  consolidation at the right lung base, which obscures a known cavitary right lower lobe lesion seen on prior CT. Small right pleural effusion unchanged. No new airspace disease or pneumothorax. No acute bony abnormalities. IMPRESSION: 1. Stable right basilar consolidation and small right effusion, consistent with persistent pneumonia. These changes obscure an underlying cavitary neoplasm described on prior CT 02/22/2022. Electronically Signed   By: Randa Ngo M.D.   On: 04/10/2022 21:01   DG Chest 2 View  Result Date: 04/01/2022 CLINICAL DATA:  Shortness of breath, cough and  lung cancer EXAM: CHEST - 2 VIEW COMPARISON:  02/22/2022 FINDINGS: Previous median sternotomy for coronary bypass. Stable heart size and vascularity. Known cavitary right lower lobe mass is obscured to a similar degree by surrounding right basilar airspace opacities. Compared to the prior study, there is slight increased streaky opacities in both lower lobes concerning for developing superimposed bibasilar pneumonia. Small right effusion noted. No pneumothorax. Trachea midline. Aorta atherosclerotic. IMPRESSION: Increased streaky bibasilar opacities concerning for superimposed pneumonia. Known right lower lobe cavitary mass is obscured by surrounding right base airspace disease. Electronically Signed   By: Jerilynn Mages.  Shick M.D.   On: 04/01/2022 16:58    Microbiology: Recent Results (from the past 240 hour(s))  Surgical PCR screen     Status: None   Collection Time: 04/12/22  9:54 AM   Specimen: Nasal Mucosa; Nasal Swab  Result Value Ref Range Status   MRSA, PCR NEGATIVE NEGATIVE Final   Staphylococcus aureus NEGATIVE NEGATIVE Final    Comment: (NOTE) The Xpert SA Assay (FDA approved for NASAL specimens in patients 9 years of age and older), is one component of a comprehensive surveillance program. It is not intended to diagnose infection nor to guide or monitor treatment. Performed at [facility], [place]  [address] [place], [place] [address]      Labs: Basic Metabolic Panel: Recent Labs  Lab 04/10/22 1950 04/11/22 0547 04/12/22 0620 04/13/22 0629 04/14/22 0500 04/15/22 0630 04/16/22 0700 04/17/22 0514  NA 138 139   < > 142 138 138 136 138  K 3.2* 2.8*   < > 3.0* 3.0* 4.2 3.2* 3.5  CL 106 109   < > 107 102 103 101 102  CO2 23 24   < > _0 GLUCOSE 150* 119*   < > 104* 95 87 93 89  BUN 17 18   < > _1 27* 29*  CREATININE 2.52* 2.62*   < > 2.35* 2.28* 2.31* 2.39* 2.41*  CALCIUM 7.1* 6.8*   < > 7.2* 7.2* 7.3* 7.6* 7.5*  MG 1.4* 1.7  --  1.3*  --  2.0  --   --    < > = values in this interval not displayed.   Liver Function Tests: Recent Labs  Lab 04/10/22 1950 04/11/22 0547  AST 35 25  ALT 17 13  ALKPHOS 48 35*  BILITOT 0.7 0.4  PROT 5.2* 4.0*  ALBUMIN 1.7* <1.5*   No results for input(s): "LIPASE", "AMYLASE" in the last 168 hours. No results for input(s): "AMMONIA" in the last 168 hours. CBC: Recent Labs  Lab 04/10/22 1950 04/11/22 0547 04/12/22 0620 04/13/22 0629 04/15/22 0630 04/16/22 0700 04/17/22 0514  WBC 5.5   < > 3.9* 4.6 5.6 4.8 4.4  NEUTROABS 3.5  --   --   --  3.0  --   --   HGB 9.3*   < > 9.1* 9.6* 10.2* 9.1* 8.6*  HCT 29.9*   < > 28.5* 30.2* 32.8* 28.6* 27.4*  MCV 90.9   < > 89.6 89.6 91.4 90.5 91.6  PLT 161   < > 137* 153 177 155 145*   < > = values in this interval not displayed.   Cardiac Enzymes: No results for input(s): "CKTOTAL", "CKMB", "CKMBINDEX", "TROPONINI" in the last 168 hours. BNP: BNP (last 3 results) Recent Labs    02/22/22 0810  BNP 65.3    ProBNP (last 3 results) Recent Labs    04/01/22 1548 04/10/22 1035  PROBNP 320.0* 575.0*    CBG: Recent Labs  Lab 04/16/22 1129 04/16/22 1640 04/16/22 2130 04/17/22 0728 04/17/22 1242  GLUCAP 98 188* 131* 89 147*       Signed:  Irine Seal MD.  [facility] 04/17/2022, 2:20 PM

## 2022-04-17 NOTE — TOC Transition Note (Signed)
Transition of Care [facility]) - CM/SW Discharge Note   Patient Details  Name: Jorge Adams MRN: [id number] Date of Birth: 1951-06-08  Transition of Care [facility] CM/SW Contact:  Leeroy Cha, RN Phone Number: 04/17/2022, 2:49 PM   Clinical Narrative:    Patient discharged to return home with self care. No toc needs present.   Final next level of care: Home/Self Care Barriers to Discharge: Barriers Resolved   Patient Goals and CMS Choice Patient states their goals for this hospitalization and ongoing recovery are:: to go homne CMS Medicare.gov Compare Post Acute Care list provided to:: Patient    Discharge Placement                       Discharge Plan and Services   Discharge Planning Services: CM Consult                                 Social Determinants of Health (SDOH) Interventions     Readmission Risk Interventions   No data to display

## 2022-04-17 NOTE — Progress Notes (Signed)
Patient discharged to home with wife, discharge instructions reviewed with patient and wife who verbalized understanding.

## 2022-04-18 ENCOUNTER — Telehealth: Payer: Self-pay | Admitting: *Deleted

## 2022-04-18 ENCOUNTER — Other Ambulatory Visit: Payer: Self-pay

## 2022-04-18 NOTE — Patient Outreach (Signed)
  Care Coordination [facility] Note Transition Care Management Unsuccessful Follow-up Telephone Call  Date of discharge and from where:  [facility] [phone]  Attempts:  1st Attempt  Reason for unsuccessful TCM follow-up call:  Left voice message  [place] Management [phone]

## 2022-04-18 NOTE — Patient Outreach (Signed)
  Care Coordination [facility] Note Transition Care Management Follow-up Telephone Call Date of discharge and from where: [facility] [phone] How have you been since you were released from the hospital? Doing ok Any questions or concerns? No  Items Reviewed: Did the pt receive and understand the discharge instructions provided? Yes  Medications obtained and verified? Yes Start K+ and Torsemide Other? No  Any new allergies since your discharge? No  Dietary orders reviewed? Yes Heart Healthy diet Do you have support at home? Yes   Home Care and Equipment/Supplies: Were home health services ordered? no If so, what is the name of the agency? N/a  Has the agency set up a time to come to the patient's home? not applicable Were any new equipment or medical supplies ordered?  No What is the name of the medical supply agency? N/a Were you able to get the supplies/equipment? not applicable Do you have any questions related to the use of the equipment or supplies? No  Functional Questionnaire: (I = Independent and D = Dependent) ADLs: I  Bathing/Dressing- I  Meal Prep- D  Eating- I  Maintaining continence- I  Transferring/Ambulation- I  Managing Meds- I  Follow up appointments reviewed:  PCP Hospital f/u appt confirmed? Y Scheduled to see Dr Damita Dunnings  on  [id number] 8:30 [facility] f/u appt confirmed? Yes  Scheduled to see Dr Madelon Lips 10/05/02023 3:30 Dr Julien Nordmann 04/24/2022 10:15. Are transportation arrangements needed? No  If their condition worsens, is the pt aware to call PCP or go to the Emergency Dept.? Yes Was the patient provided with contact information for the PCP's office or ED? Yes Was to pt encouraged to call back with questions or concerns? Yes  SDOH assessments and interventions completed:   Yes  Care Coordination Interventions Activated:  Yes   Care Coordination Interventions:  No Care Coordination interventions needed at this time.   Encounter Outcome:  Pt.  Visit Completed     [facility] [phone]

## 2022-04-19 ENCOUNTER — Other Ambulatory Visit: Payer: Self-pay | Admitting: Internal Medicine

## 2022-04-19 ENCOUNTER — Ambulatory Visit: Payer: Medicare HMO | Admitting: Cardiology

## 2022-04-21 ENCOUNTER — Encounter: Payer: Self-pay | Admitting: Internal Medicine

## 2022-04-22 ENCOUNTER — Encounter (HOSPITAL_COMMUNITY): Payer: Self-pay

## 2022-04-22 LAB — SURGICAL PATHOLOGY

## 2022-04-24 ENCOUNTER — Inpatient Hospital Stay: Payer: Medicare HMO | Attending: Internal Medicine

## 2022-04-24 ENCOUNTER — Ambulatory Visit: Payer: Medicare HMO

## 2022-04-24 ENCOUNTER — Ambulatory Visit: Payer: Medicare HMO | Admitting: Internal Medicine

## 2022-04-24 ENCOUNTER — Inpatient Hospital Stay: Payer: Medicare HMO

## 2022-04-24 ENCOUNTER — Encounter: Payer: Self-pay | Admitting: Internal Medicine

## 2022-04-24 ENCOUNTER — Other Ambulatory Visit: Payer: Medicare HMO

## 2022-04-24 ENCOUNTER — Inpatient Hospital Stay (HOSPITAL_BASED_OUTPATIENT_CLINIC_OR_DEPARTMENT_OTHER): Payer: Medicare HMO | Admitting: Internal Medicine

## 2022-04-24 DIAGNOSIS — Z5112 Encounter for antineoplastic immunotherapy: Secondary | ICD-10-CM | POA: Diagnosis not present

## 2022-04-24 DIAGNOSIS — C3431 Malignant neoplasm of lower lobe, right bronchus or lung: Secondary | ICD-10-CM | POA: Insufficient documentation

## 2022-04-24 DIAGNOSIS — Z79899 Other long term (current) drug therapy: Secondary | ICD-10-CM | POA: Insufficient documentation

## 2022-04-24 DIAGNOSIS — C3491 Malignant neoplasm of unspecified part of right bronchus or lung: Secondary | ICD-10-CM | POA: Diagnosis not present

## 2022-04-24 LAB — CBC WITH DIFFERENTIAL (CANCER CENTER ONLY)
Abs Immature Granulocytes: 0.01 10*3/uL (ref 0.00–0.07)
Basophils Absolute: 0 10*3/uL (ref 0.0–0.1)
Basophils Relative: 1 %
Eosinophils Absolute: 0.3 10*3/uL (ref 0.0–0.5)
Eosinophils Relative: 6 %
HCT: 29.1 % — ABNORMAL LOW (ref 39.0–52.0)
Hemoglobin: 9.3 g/dL — ABNORMAL LOW (ref 13.0–17.0)
Immature Granulocytes: 0 %
Lymphocytes Relative: 25 %
Lymphs Abs: 1.3 10*3/uL (ref 0.7–4.0)
MCH: 29.1 pg (ref 26.0–34.0)
MCHC: 32 g/dL (ref 30.0–36.0)
MCV: 90.9 fL (ref 80.0–100.0)
Monocytes Absolute: 0.5 10*3/uL (ref 0.1–1.0)
Monocytes Relative: 8 %
Neutro Abs: 3.3 10*3/uL (ref 1.7–7.7)
Neutrophils Relative %: 60 %
Platelet Count: 187 10*3/uL (ref 150–400)
RBC: 3.2 MIL/uL — ABNORMAL LOW (ref 4.22–5.81)
RDW: 15.2 % (ref 11.5–15.5)
WBC Count: 5.4 10*3/uL (ref 4.0–10.5)
nRBC: 0 % (ref 0.0–0.2)

## 2022-04-24 LAB — CMP (CANCER CENTER ONLY)
ALT: 17 U/L (ref 0–44)
AST: 25 U/L (ref 15–41)
Albumin: 2.4 g/dL — ABNORMAL LOW (ref 3.5–5.0)
Alkaline Phosphatase: 37 U/L — ABNORMAL LOW (ref 38–126)
Anion gap: 6 (ref 5–15)
BUN: 22 mg/dL (ref 8–23)
CO2: 28 mmol/L (ref 22–32)
Calcium: 7.7 mg/dL — ABNORMAL LOW (ref 8.9–10.3)
Chloride: 103 mmol/L (ref 98–111)
Creatinine: 2.87 mg/dL — ABNORMAL HIGH (ref 0.61–1.24)
GFR, Estimated: 23 mL/min — ABNORMAL LOW (ref >60)
Glucose, Bld: 189 mg/dL — ABNORMAL HIGH (ref 70–99)
Potassium: 3.9 mmol/L (ref 3.5–5.1)
Sodium: 137 mmol/L (ref 135–145)
Total Bilirubin: 0.4 mg/dL (ref 0.3–1.2)
Total Protein: 5.2 g/dL — ABNORMAL LOW (ref 6.5–8.1)

## 2022-04-24 LAB — TSH: TSH: 6.297 u[IU]/mL — ABNORMAL HIGH (ref 0.350–4.500)

## 2022-04-24 MED ORDER — HEPARIN SOD (PORK) LOCK FLUSH 100 UNIT/ML IV SOLN
500.0000 [IU] | Freq: Once | INTRAVENOUS | Status: AC | PRN
  Administered 2022-04-24: 500 [IU]

## 2022-04-24 MED ORDER — SODIUM CHLORIDE 0.9 % IV SOLN: Freq: Once | INTRAVENOUS | Status: AC

## 2022-04-24 MED ORDER — SODIUM CHLORIDE 0.9% FLUSH
10.0000 mL | INTRAVENOUS | Status: DC | PRN
  Administered 2022-04-24: 10 mL

## 2022-04-24 MED ORDER — SODIUM CHLORIDE 0.9 % IV SOLN
1500.0000 mg | Freq: Once | INTRAVENOUS | Status: AC
  Administered 2022-04-24: 1500 mg via INTRAVENOUS
  Filled 2022-04-24: qty 30

## 2022-04-24 NOTE — Patient Instructions (Signed)
[facility]  Discharge Instructions: Thank you for choosing [facility] to provide your oncology and hematology care.   If you have a lab appointment with the Cancer Center, please go directly to the Cancer Center and check in at the registration area.   Wear comfortable clothing and clothing appropriate for easy access to any Portacath or PICC line.   We strive to give you quality time with your provider. You may need to reschedule your appointment if you arrive late (15 or more minutes).  Arriving late affects you and other patients whose appointments are after yours.  Also, if you miss three or more appointments without notifying the office, you may be dismissed from the clinic at the provider's discretion.      For prescription refill requests, have your pharmacy contact our office and allow 72 hours for refills to be completed.    Today you received the following chemotherapy and/or immunotherapy agents; Imfinzi      To help prevent nausea and vomiting after your treatment, we encourage you to take your nausea medication as directed.  BELOW ARE SYMPTOMS THAT SHOULD BE REPORTED IMMEDIATELY: *FEVER GREATER THAN 100.4 F (38 C) OR HIGHER *CHILLS OR SWEATING *NAUSEA AND VOMITING THAT IS NOT CONTROLLED WITH YOUR NAUSEA MEDICATION *UNUSUAL SHORTNESS OF BREATH *UNUSUAL BRUISING OR BLEEDING *URINARY PROBLEMS (pain or burning when urinating, or frequent urination) *BOWEL PROBLEMS (unusual diarrhea, constipation, pain near the anus) TENDERNESS IN MOUTH AND THROAT WITH OR WITHOUT PRESENCE OF ULCERS (sore throat, sores in mouth, or a toothache) UNUSUAL RASH, SWELLING OR PAIN  UNUSUAL VAGINAL DISCHARGE OR ITCHING   Items with * indicate a potential emergency and should be followed up as soon as possible or go to the Emergency Department if any problems should occur.  Please show the CHEMOTHERAPY ALERT CARD or IMMUNOTHERAPY ALERT CARD at check-in to  the Emergency Department and triage nurse.  Should you have questions after your visit or need to cancel or reschedule your appointment, please contact [facility]  Dept: [phone]  and follow the prompts.  Office hours are 8:00 a.m. to 4:30 p.m. Monday - Friday. Please note that voicemails left after 4:00 p.m. may not be returned until the following business day.  We are closed weekends and major holidays. You have access to a nurse at all times for urgent questions. Please call the main number to the clinic Dept: [phone] and follow the prompts.   For any non-urgent questions, you may also contact your provider using MyChart. We now offer e-Visits for anyone 18 and older to request care online for non-urgent symptoms. For details visit [email or web address].   Also download the MyChart app! Go to the app store, search "MyChart", open the app, select [facility], and log in with your MyChart username and password.  Masks are optional in the cancer centers. If you would like for your care team to wear a mask while they are taking care of you, please let them know. You may have one support person who is at least 73 years old accompany you for your appointments. 

## 2022-04-24 NOTE — Progress Notes (Signed)
[place] [phone]   [phone]  OFFICE PROGRESS NOTE  [name], MD [place] [address]  DIAGNOSIS:  Stage IIIb (T4, N0/N2, M0) non-small cell lung cancer, squamous cell carcinoma presented with large right lower lobe lung mass in addition to satellite nodules close to the mass and suspicious mediastinal lymphadenopathy diagnosed in May 2023   PD-L1 expression 1%   PRIOR THERAPY:   A course of concurrent chemoradiation with weekly carboplatin for AUC of 2 and paclitaxel 45 Mg/M2.  Last dose 02/04/22.  Status post 6 cycles.   CURRENT THERAPY: Consolidation immunotherapy with Imfinzi 1500 mg IV every 4 weeks starting next week on 03/27/2022.  Status post 1 cycle.  INTERVAL HISTORY: Jorge Adams 71 y.o. male returns to the clinic today for follow-up visit accompanied by his wife.  The patient lost a lot of weight recently after his extensive diuresis during his hospitalization.  He is feeling a little bit better today with no significant chest pain, shortness of breath except with exertion and no cough or hemoptysis.  He has no nausea, vomiting, diarrhea or constipation.  He was found to have acute kidney injury based on recent biopsy of the kidney for evaluation of the worsening renal failure.  The patient has no fever or chills.  He is here today for evaluation before starting cycle #2 of his consolidation immunotherapy.  MEDICAL HISTORY: Past Medical History:  Diagnosis Date   Arthritis    ASHD (arteriosclerotic heart disease)    Atherosclerosis of native arteries of the extremities with intermittent claudication    Cataract    Coronary atherosclerosis of native coronary artery    Diabetes mellitus    NIDDM   Dyslipidemia    ED (erectile dysfunction)    Full dentures    Glaucoma associated with ocular disorder, mild stage    bilateral   Hyperlipidemia    Hypertension    under control, has been on  med. > 9 yr.   Impotence of organic origin    Meniscus tear 05/2012   right knee   Myocardial infarction [facility]) 10/2002   Stented coronary artery     ALLERGIES:  is allergic to metformin and related.  MEDICATIONS:  Current Outpatient Medications  Medication Sig Dispense Refill   albuterol (VENTOLIN HFA) 108 (90 Base) MCG/ACT inhaler Inhale 1-2 puffs into the lungs every 6 (six) hours as needed (for cough.  okay to fill with albuterol/proair/ventolin.). (Patient taking differently: Inhale 2 puffs into the lungs 3 (three) times daily as needed for shortness of breath.) 8 g 2   aspirin EC 81 MG tablet Take 1 tablet (81 mg total) by mouth daily. Okay to restart this medication on 12/05/2021. 30 tablet 11   blood glucose meter kit and supplies Dispense based on pt and insurance preference. Use up to 3 times daily as directed. (Dx. E11.9). 1 each 12   Blood Pressure Monitoring (SPHYGMOMANOMETER) MISC Use daily to check BP.  Dx I10 1 each 0   brimonidine (ALPHAGAN P) 0.1 % SOLN Place 2 drops into both eyes 2 (two) times daily.     cilostazol (PLETAL) 100 MG tablet TAKE 1 TABLET BY MOUTH TWICE A DAY 180 tablet 1   Cyanocobalamin (VITAMIN B12 PO) Take 1 tablet by mouth in the morning and at bedtime.     dorzolamide-timolol (COSOPT) 22.3-6.8 MG/ML ophthalmic solution Place 1 drop into both eyes 2 (two) times daily.  FeFum-FePoly-FA-B Cmp-C-Biot (INTEGRA PLUS) CAPS Take 1 capsule by mouth every morning. 30 capsule 2   fenofibrate (TRICOR) 145 MG tablet TAKE 1 TABLET BY MOUTH EVERY DAY IN THE EVENING (Patient taking differently: Take 145 mg by mouth daily.) 90 tablet 1   HYDROcodone bit-homatropine (HYCODAN) 5-1.5 MG/5ML syrup Take 5 mLs by mouth every 8 (eight) hours as needed for cough (sedation caution.). 120 mL 0   ipratropium-albuterol (DUONEB) 0.5-2.5 (3) MG/3ML SOLN Take 3 mLs by nebulization every 6 (six) hours as needed. (Patient not taking: Reported on 04/10/2022) 360 mL 1   levothyroxine  (SYNTHROID) 50 MCG tablet TAKE 1 TABLET BY MOUTH DAILY BEFORE BREAKFAST 30 tablet 1   metFORMIN (GLUCOPHAGE) 500 MG tablet Take 1,000 mg by mouth 2 (two) times daily. (Patient not taking: Reported on 04/24/2022)     metoprolol tartrate (LOPRESSOR) 25 MG tablet Take 0.5 tablets (12.5 mg total) by mouth 2 (two) times daily. Hold for SBP less than 110 (Patient taking differently: Take 25 mg by mouth 2 (two) times daily. Hold for SBP less than 110) 90 tablet 4   Netarsudil-Latanoprost (ROCKLATAN) 0.02-0.005 % SOLN Apply 1 drop to eye at bedtime. In both eyes     nitroGLYCERIN (NITROSTAT) 0.4 MG SL tablet Place 1 tablet (0.4 mg total) under the tongue every 5 (five) minutes as needed for chest pain. 25 tablet 5   omega-3 acid ethyl esters (LOVAZA) 1 g capsule TAKE 1 CAPSULE BY MOUTH TWICE A DAY (Patient taking differently: Take 1 g by mouth 2 (two) times daily.) 180 capsule 1   potassium chloride (KLOR-CON) 10 MEQ tablet Take 4 tablets (40 mEq total) by mouth 2 (two) times daily. 240 tablet 1   prochlorperazine (COMPAZINE) 10 MG tablet Take 1 tablet (10 mg total) by mouth every 6 (six) hours as needed for nausea or vomiting. (Patient taking differently: Take 10 mg by mouth 2 (two) times daily as needed for nausea or vomiting.) 30 tablet 2   rosuvastatin (CRESTOR) 20 MG tablet TAKE 1 TABLET BY MOUTH EVERY DAY 30 tablet 0   torsemide (DEMADEX) 20 MG tablet Take 4 tablets (80 mg total) by mouth 2 (two) times daily. 240 tablet 1   No current facility-administered medications for this visit.    SURGICAL HISTORY:  Past Surgical History:  Procedure Laterality Date   ANTERIOR CERVICAL DECOMP/DISCECTOMY FUSION N/A 05/07/2018   Procedure: ANTERIOR CERVICAL DECOMPRESSION/DISCECTOMY FUSION CERVICAL FIVE - CERVICAL SIX;  Surgeon: Consuella Lose, MD;  Location: [place];  Service: Neurosurgery;  Laterality: N/A;  ANTERIOR CERVICAL DECOMPRESSION/DISCECTOMY FUSION CERVICAL FIVE - CERVICAL SIX   BRONCHIAL BIOPSY   12/04/2021   Procedure: BRONCHIAL BIOPSIES;  Surgeon: Collene Gobble, MD;  Location: [place];  Service: Cardiopulmonary;;   BRONCHIAL BRUSHINGS  12/04/2021   Procedure: BRONCHIAL BRUSHINGS;  Surgeon: Collene Gobble, MD;  Location: [place];  Service: Cardiopulmonary;;   BRONCHIAL NEEDLE ASPIRATION BIOPSY  12/04/2021   Procedure: BRONCHIAL NEEDLE ASPIRATION BIOPSIES;  Surgeon: Collene Gobble, MD;  Location: [place];  Service: Cardiopulmonary;;   CARDIAC CATHETERIZATION  10/23/2002   had 1 stent placed then-- by Dr. Melvern Banker   CARDIAC CATHETERIZATION N/A 12/21/2015   Procedure: Left Heart Cath and Coronary Angiography;  Surgeon: Adrian Prows, MD;  Location: [facility];  Service: Cardiovascular;  Laterality: N/A;   CATARACT EXTRACTION Right 05/2017   CATARACT EXTRACTION Left 07/2017   COLONOSCOPY  10/08/2010   Dr.Patterson   CORONARY ARTERY BYPASS GRAFT N/A 01/02/2016   Procedure:  CORONARY ARTERY BYPASS GRAFTING (CABG) TIMES 3 USING LEFT INTERNAL MAMMARY ARTERY AND RIGHT SAPHENOUS LEG VEIN HARVESTED ENDOSCOPICALLY;  Surgeon: Ivin Poot, MD;  Location: [facility];  Service: Open Heart Surgery;  Laterality: N/A;   EYE SURGERY     INSERTION OF MESH N/A 05/06/2016   Procedure: INSERTION OF MESH;  Surgeon: Georganna Skeans, MD;  Location: [place];  Service: General;  Laterality: N/A;   IR IMAGING GUIDED PORT INSERTION  04/12/2022   KNEE ARTHROSCOPY WITH MEDIAL MENISECTOMY  06/04/2012   Procedure: KNEE ARTHROSCOPY WITH MEDIAL MENISECTOMY;  Surgeon: Ninetta Lights, MD;  Location: [facility];  Service: Orthopedics;  Laterality: Right;  RIGHT SCOPE MEDIAL MENISCECTOMY, CHONDROPLASTY, EXCISION LOOSE BODY   LOWER EXTREMITY ANGIOGRAM N/A 11/23/2013   Procedure: LOWER EXTREMITY ANGIOGRAM;  Surgeon: Laverda Page, MD;  Location: [facility] C[facility];  Service: Cardiovascular;  Laterality: N/A;   NECK SURGERY N/A    02/19/2018   PERIPHERAL VASCULAR CATHETERIZATION N/A 12/21/2015    Procedure: Abdominal Aortogram;  Surgeon: Adrian Prows, MD;  Location: [place] [facility];  Service: Cardiovascular;  Laterality: N/A;   PERIPHERAL VASCULAR CATHETERIZATION N/A 12/21/2015   Procedure: Abdominal Aortogram w/Lower Extremity;  Surgeon: Adrian Prows, MD;  Location: [place] [facility];  Service: Cardiovascular;  Laterality: N/A;   TEE WITHOUT CARDIOVERSION N/A 01/02/2016   Procedure: TRANSESOPHAGEAL ECHOCARDIOGRAM (TEE);  Surgeon: Ivin Poot, MD;  Location: [facility];  Service: Open Heart Surgery;  Laterality: N/A;   UMBILICAL HERNIA REPAIR N/A 05/06/2016   Procedure: HERNIA REPAIR UMBILICAL ADULT;  Surgeon: Georganna Skeans, MD;  Location: [facility];  Service: General;  Laterality: N/A;   VIDEO BRONCHOSCOPY N/A 12/04/2021   Procedure: VIDEO BRONCHOSCOPY WITH FLUORO;  Surgeon: Collene Gobble, MD;  Location: [facility];  Service: Cardiopulmonary;  Laterality: N/A;    REVIEW OF SYSTEMS:  A comprehensive review of systems was negative except for: Constitutional: positive for anorexia, fatigue, and weight loss Respiratory: positive for dyspnea on exertion Musculoskeletal: positive for muscle weakness   PHYSICAL EXAMINATION: General appearance: alert, cooperative, fatigued, and no distress Head: Normocephalic, without obvious abnormality, atraumatic Neck: no adenopathy, no JVD, supple, symmetrical, trachea midline, and thyroid not enlarged, symmetric, no tenderness/mass/nodules Lymph nodes: Cervical, supraclavicular, and axillary nodes normal. Resp: clear to auscultation bilaterally Back: symmetric, no curvature. ROM normal. No CVA tenderness. Cardio: regular rate and rhythm, S1, S2 normal, no murmur, click, rub or gallop GI: soft, non-tender; bowel sounds normal; no masses,  no organomegaly Extremities: extremities normal, atraumatic, no cyanosis or edema  ECOG PERFORMANCE STATUS: 1 - Symptomatic but completely ambulatory  Blood pressure 121/67, pulse 87, temperature 97.6 F (36.4 C),  temperature source Oral, resp. rate 18, height 5' 10"  (1.778 m), weight 167 lb 1 oz (75.8 kg), SpO2 98 %.  LABORATORY DATA: Lab Results  Component Value Date   WBC 5.4 04/24/2022   HGB 9.3 (L) 04/24/2022   HCT 29.1 (L) 04/24/2022   MCV 90.9 04/24/2022   PLT 187 04/24/2022      Chemistry      Component Value Date/Time   NA 137 04/24/2022 0947   NA 143 04/10/2018 1157   K 3.9 04/24/2022 0947   CL 103 04/24/2022 0947   CO2 28 04/24/2022 0947   BUN 22 04/24/2022 0947   BUN 8 04/10/2018 1157   CREATININE 2.87 (H) 04/24/2022 0947   CREATININE 0.78 05/02/2017 0000   CREATININE 0.71 10/26/2012 0903      Component Value Date/Time  CALCIUM 7.7 (L) 04/24/2022 0947   ALKPHOS 37 (L) 04/24/2022 0947   AST 25 04/24/2022 0947   ALT 17 04/24/2022 0947   BILITOT 0.4 04/24/2022 0947       RADIOGRAPHIC STUDIES: US BIOPSY (KIDNEY)  Result Date: 04/16/2022 INDICATION: 71 year old male with history of acute kidney injury of indeterminate etiology. EXAM: ULTRASOUND GUIDED RENAL BIOPSY COMPARISON:  None Available. MEDICATIONS: None. ANESTHESIA/SEDATION: Fentanyl 50 mcg IV; Versed 1 mg IV Total Moderate Sedation time: 11 minutes; The patient was continuously monitored during the procedure by the interventional radiology nurse under my direct supervision. COMPLICATIONS: None immediate. PROCEDURE: Informed written consent was obtained from the patient after a discussion of the risks, benefits and alternatives to treatment. The patient understands and consents the procedure. A timeout was performed prior to the initiation of the procedure. Ultrasound scanning was performed of the bilateral flanks. The inferior pole of the right kidney was selected for biopsy due to location and sonographic window. The procedure was planned. The operative site was prepped and draped in the usual sterile fashion. The overlying soft tissues were anesthetized with 1% lidocaine with epinephrine. A 16 gauge core needle biopsy  device was advanced into the inferior cortex of the right kidney and 2 core biopsies were obtained under direct ultrasound guidance. Images were saved for documentation purposes. The biopsy device was removed while injecting Gel-Foam slurry under ultrasound guidance and hemostasis was obtained with brief manual compression. Post procedural scanning was negative for significant post procedural hemorrhage or additional complication. A dressing was placed. The patient tolerated the procedure well without immediate post procedural complication. IMPRESSION: Technically successful ultrasound guided right renal biopsy. Ruthann Cancer, MD [facility] Electronically Signed   By: Ruthann Cancer M.D.   On: 04/16/2022 11:52   IR IMAGING GUIDED PORT INSERTION  Result Date: 04/12/2022 CLINICAL DATA:  Lung carcinoma, needs durable venous access for treatment regimen EXAM: TUNNELED PORT CATHETER PLACEMENT WITH ULTRASOUND AND FLUOROSCOPIC GUIDANCE FLUOROSCOPY: Radiation Exposure Index (as provided by the fluoroscopic device): 5 mGy air Kerma ANESTHESIA/SEDATION: Intravenous Fentanyl 4mg and Versed 121mwere administered as conscious sedation during continuous monitoring of the patient's level of consciousness and physiological / cardiorespiratory status by the radiology RN, with a total moderate sedation time of 17 minutes. TECHNIQUE: The procedure, risks, benefits, and alternatives were explained to the patient. Questions regarding the procedure were encouraged and answered. The patient understands and consents to the procedure. Patency of the right IJ vein was confirmed with ultrasound with image documentation. An appropriate skin site was determined. Skin site was marked. Region was prepped using maximum barrier technique including cap and mask, sterile gown, sterile gloves, large sterile sheet, and Chlorhexidine as cutaneous antisepsis. The region was infiltrated  locally with 1% lidocaine. Under real-time ultrasound guidance, the right IJ vein was accessed with a 21 gauge micropuncture needle; the needle tip within the vein was confirmed with ultrasound image documentation. Needle was exchanged over a 018 guidewire for transitional dilator, and vascular measurement was performed. A small incision was made on the right anterior chest wall and a subcutaneous pocket fashioned. The power-injectable port was positioned and its catheter tunneled to the right IJ dermatotomy site. The transitional dilator was exchanged over an Amplatz wire for a peel-away sheath, through which the port catheter, which had been trimmed to the appropriate length, was advanced and positioned under fluoroscopy with its tip at the cavoatrial junction. Spot chest radiograph confirms good catheter position and no pneumothorax. The  port was flushed per protocol. The pocket was closed with deep interrupted and subcuticular continuous 3-0 Monocryl sutures. The incisions were covered with Dermabond then covered with a sterile dressing. The patient tolerated the procedure well. COMPLICATIONS: COMPLICATIONS None immediate IMPRESSION: Technically successful right IJ power-injectable port catheter placement. Ready for routine use. Electronically Signed   By: Lucrezia Europe M.D.   On: 04/12/2022 16:15   DG Chest 2 View  Result Date: 04/11/2022 CLINICAL DATA:  Cough EXAM: CHEST - 2 VIEW COMPARISON:  04/01/2022 FINDINGS: Cardiac shadow is enlarged but stable. Postsurgical changes are again noted. Patchy airspace opacity is again noted in the right base stable in appearance from the prior exam and as well as a prior CT examination from August of 2023. The known cavitary neoplasm is somewhat obscured by the adjacent infiltrates. No bony abnormality is noted. Left lung is clear. IMPRESSION: Persistent right basilar infiltrate and cavitary mass. Electronically Signed   By: Inez Catalina M.D.   On: 04/11/2022 22:51   US  RENAL  Result Date: 04/11/2022 CLINICAL DATA:  Acute renal injury EXAM: RENAL / URINARY TRACT ULTRASOUND COMPLETE COMPARISON:  None Available. FINDINGS: Right Kidney: Renal measurements: 12.8 x 6.2 x 5.9 cm = volume: 270 mL. Echogenicity within normal limits. No mass or hydronephrosis visualized. Left Kidney: Renal measurements: 14.4 x 7.3 x 5.9 cm = volume: 323 mL.  3 Bladder: Appears normal for degree of bladder distention. Other: None. IMPRESSION: 1. Normal renal ultrasound. 2. Normal bladder. Electronically Signed   By: Suzy Bouchard M.D.   On: 04/11/2022 15:52   DG Chest Port 1 View  Result Date: 04/10/2022 CLINICAL DATA:  Short of breath, hypokalemia EXAM: PORTABLE CHEST 1 VIEW COMPARISON:  04/10/2022, 04/01/2022 FINDINGS: Single frontal view of the chest demonstrates a stable cardiac silhouette. Postsurgical changes from CABG. Continued consolidation at the right lung base, which obscures a known cavitary right lower lobe lesion seen on prior CT. Small right pleural effusion unchanged. No new airspace disease or pneumothorax. No acute bony abnormalities. IMPRESSION: 1. Stable right basilar consolidation and small right effusion, consistent with persistent pneumonia. These changes obscure an underlying cavitary neoplasm described on prior CT 02/22/2022. Electronically Signed   By: Randa Ngo M.D.   On: 04/10/2022 21:01   DG Chest 2 View  Result Date: 04/01/2022 CLINICAL DATA:  Shortness of breath, cough and lung cancer EXAM: CHEST - 2 VIEW COMPARISON:  02/22/2022 FINDINGS: Previous median sternotomy for coronary bypass. Stable heart size and vascularity. Known cavitary right lower lobe mass is obscured to a similar degree by surrounding right basilar airspace opacities. Compared to the prior study, there is slight increased streaky opacities in both lower lobes concerning for developing superimposed bibasilar pneumonia. Small right effusion noted. No pneumothorax. Trachea midline. Aorta  atherosclerotic. IMPRESSION: Increased streaky bibasilar opacities concerning for superimposed pneumonia. Known right lower lobe cavitary mass is obscured by surrounding right base airspace disease. Electronically Signed   By: Jerilynn Mages.  Shick M.D.   On: 04/01/2022 16:58    ASSESSMENT AND PLAN: This is a very pleasant 71 years old white male with Stage IIIb (T4, N0/N2, M0) non-small cell lung cancer, squamous cell carcinoma presented with large right lower lobe lung mass in addition to satellite nodules close to the mass and suspicious mediastinal lymphadenopathy diagnosed in May 2023. The patient underwent a course of concurrent chemoradiation with weekly carboplatin for AUC of 2 and paclitaxel 45 Mg/M2 status post 6 cycles.  The patient has been tolerating this treatment  well with no concerning adverse effect except for fatigue. The patient has partial response to this treatment. He is currently undergoing consolidation treatment with immunotherapy with Imfinzi 1500 Mg IV every 4 weeks status post 1 cycle.  He tolerated the first cycle of his treatment well but was admitted to the hospital few weeks ago with worsening fatigue and weakness as well as acute kidney injury.  He had extensive diuresis during his hospitalization and lost a lot of weight. I had a lengthy discussion with the patient and his wife today about his current condition and further treatment options.  I gave the patient the option of continuous observation and monitoring versus proceeding with immunotherapy as planned.  The patient and his wife are interested in continuing his current treatment plan with Imfinzi.  He will proceed with cycle #2 today. I will see him back for follow-up visit in 4 weeks with the start of cycle #3. The patient was advised to call immediately if he has any other concerning symptoms in the interval. The patient voices understanding of current disease status and treatment options and is in agreement with the current  care plan.  All questions were answered. The patient knows to call the clinic with any problems, questions or concerns. We can certainly see the patient much sooner if necessary.  The total time spent in the appointment was 25 minutes.  Disclaimer: This note was dictated with voice recognition software. Similar sounding words can inadvertently be transcribed and may not be corrected upon review.

## 2022-04-24 NOTE — Progress Notes (Signed)
Okay to treat with creat 2.87 per Dr. Julien Nordmann

## 2022-04-25 DIAGNOSIS — C3490 Malignant neoplasm of unspecified part of unspecified bronchus or lung: Secondary | ICD-10-CM | POA: Diagnosis not present

## 2022-04-25 DIAGNOSIS — E785 Hyperlipidemia, unspecified: Secondary | ICD-10-CM | POA: Diagnosis not present

## 2022-04-25 DIAGNOSIS — I129 Hypertensive chronic kidney disease with stage 1 through stage 4 chronic kidney disease, or unspecified chronic kidney disease: Secondary | ICD-10-CM | POA: Diagnosis not present

## 2022-04-25 DIAGNOSIS — N02B9 Other recurrent and persistent immunoglobulin A nephropathy: Secondary | ICD-10-CM | POA: Diagnosis not present

## 2022-04-25 DIAGNOSIS — N179 Acute kidney failure, unspecified: Secondary | ICD-10-CM | POA: Diagnosis not present

## 2022-04-25 DIAGNOSIS — I10 Essential (primary) hypertension: Secondary | ICD-10-CM | POA: Diagnosis not present

## 2022-04-27 DIAGNOSIS — J189 Pneumonia, unspecified organism: Secondary | ICD-10-CM | POA: Diagnosis not present

## 2022-04-29 ENCOUNTER — Telehealth: Payer: Self-pay | Admitting: Internal Medicine

## 2022-04-29 NOTE — Telephone Encounter (Signed)
Called patient regarding November and December appointments, left a voicemail.

## 2022-04-30 ENCOUNTER — Inpatient Hospital Stay: Payer: Medicare HMO | Admitting: Nutrition

## 2022-05-01 ENCOUNTER — Telehealth: Payer: Self-pay | Admitting: Family Medicine

## 2022-05-01 DIAGNOSIS — N179 Acute kidney failure, unspecified: Secondary | ICD-10-CM | POA: Diagnosis not present

## 2022-05-01 NOTE — Telephone Encounter (Signed)
Duly noted.

## 2022-05-01 NOTE — Telephone Encounter (Signed)
Pt's wife, Juliann Pulse called stating she wanted to speak to [place] or Janett Billow before her & the pt's appt on 05/02/22. Juliann Pulse thinks the pt is depressed, not eating or sleeping & Juliann Pulse is very concerned. Juliann Pulse didn't want to speak on issue in front of the pt due to the pt reactions from possibly being in denial. Call back # [phone]

## 2022-05-02 ENCOUNTER — Encounter: Payer: Self-pay | Admitting: Family Medicine

## 2022-05-02 ENCOUNTER — Ambulatory Visit (INDEPENDENT_AMBULATORY_CARE_PROVIDER_SITE_OTHER): Payer: Medicare HMO | Admitting: Family Medicine

## 2022-05-02 VITALS — BP 102/60 | HR 81 | Temp 97.0°F | Ht 70.0 in | Wt 158.0 lb

## 2022-05-02 DIAGNOSIS — Z23 Encounter for immunization: Secondary | ICD-10-CM

## 2022-05-02 DIAGNOSIS — R7989 Other specified abnormal findings of blood chemistry: Secondary | ICD-10-CM

## 2022-05-02 DIAGNOSIS — C3491 Malignant neoplasm of unspecified part of right bronchus or lung: Secondary | ICD-10-CM

## 2022-05-02 DIAGNOSIS — R059 Cough, unspecified: Secondary | ICD-10-CM | POA: Diagnosis not present

## 2022-05-02 LAB — COMPREHENSIVE METABOLIC PANEL
ALT: 20 U/L (ref 0–53)
AST: 37 U/L (ref 0–37)
Albumin: 2.7 g/dL — ABNORMAL LOW (ref 3.5–5.2)
Alkaline Phosphatase: 43 U/L (ref 39–117)
BUN: 34 mg/dL — ABNORMAL HIGH (ref 6–23)
CO2: 26 mEq/L (ref 19–32)
Calcium: 8.4 mg/dL (ref 8.4–10.5)
Chloride: 100 mEq/L (ref 96–112)
Creatinine, Ser: 2.84 mg/dL — ABNORMAL HIGH (ref 0.40–1.50)
GFR: 21.43 mL/min — ABNORMAL LOW (ref >60.00)
Glucose, Bld: 93 mg/dL (ref 70–99)
Potassium: 4.5 mEq/L (ref 3.5–5.1)
Sodium: 136 mEq/L (ref 135–145)
Total Bilirubin: 0.5 mg/dL (ref 0.2–1.2)
Total Protein: 6.4 g/dL (ref 6.0–8.3)

## 2022-05-02 LAB — CBC WITH DIFFERENTIAL/PLATELET
Basophils Absolute: 0.1 10*3/uL (ref 0.0–0.1)
Basophils Relative: 0.8 % (ref 0.0–3.0)
Eosinophils Absolute: 0.9 10*3/uL — ABNORMAL HIGH (ref 0.0–0.7)
Eosinophils Relative: 12.4 % — ABNORMAL HIGH (ref 0.0–5.0)
HCT: 30.9 % — ABNORMAL LOW (ref 39.0–52.0)
Hemoglobin: 10.3 g/dL — ABNORMAL LOW (ref 13.0–17.0)
Lymphocytes Relative: 29.2 % (ref 12.0–46.0)
Lymphs Abs: 2 10*3/uL (ref 0.7–4.0)
MCHC: 33.3 g/dL (ref 30.0–36.0)
MCV: 88 fl (ref 78.0–100.0)
Monocytes Absolute: 0.5 10*3/uL (ref 0.1–1.0)
Monocytes Relative: 7.5 % (ref 3.0–12.0)
Neutro Abs: 3.5 10*3/uL (ref 1.4–7.7)
Neutrophils Relative %: 50.1 % (ref 43.0–77.0)
Platelets: 270 10*3/uL (ref 150.0–400.0)
RBC: 3.51 Mil/uL — ABNORMAL LOW (ref 4.22–5.81)
RDW: 16.2 % — ABNORMAL HIGH (ref 11.5–15.5)
WBC: 7 10*3/uL (ref 4.0–10.5)

## 2022-05-02 LAB — MAGNESIUM: Magnesium: 1.2 mg/dL — ABNORMAL LOW (ref 1.5–2.5)

## 2022-05-02 MED ORDER — TRIAMCINOLONE ACETONIDE 0.1 % EX CREA: 1.0000 | TOPICAL_CREAM | Freq: Two times a day (BID) | CUTANEOUS | 3 refills | Status: AC

## 2022-05-02 MED ORDER — TORSEMIDE 20 MG PO TABS: 40.0000 mg | ORAL_TABLET | Freq: Two times a day (BID) | ORAL | Status: AC

## 2022-05-02 MED ORDER — HYDROCODONE BIT-HOMATROP MBR 5-1.5 MG/5ML PO SOLN: 5.0000 mL | Freq: Three times a day (TID) | ORAL | 0 refills | Status: DC | PRN

## 2022-05-02 MED ORDER — MIRTAZAPINE 7.5 MG PO TABS: 7.5000 mg | ORAL_TABLET | Freq: Every day | ORAL | 2 refills | Status: DC

## 2022-05-02 NOTE — Progress Notes (Signed)
D/w pt about inpatient course.  Status post diuresis.  Losing weight and mood is lower.  Appetite is lower.  Trying to drink boost mult times a day. D/w pt about weight and grief vs depression.  Sugar controlled off metformin, usually ~110.  We will plan on rechecking his labs today.  I can update the renal clinic.  He is having trouble swallowing his potassium pills.  We talked about options, potentially including liquid potassium.  No fevers.  He still has a cough and needs a refill on his cough syrup.  Left wrist/hand pain has improved in the meantime.  We talked about his goals of care and at this point he wants to go through with active treatment, and I think that makes sense.  Meds, vitals, and allergies reviewed.   ROS: Per HPI unless specifically indicated in ROS section   Itchy rash on the upper back, not dermatomal.    Nad but downcast demeanor.  Speech is fluent. Ncat Neck supple, no LA Rrr Ctab Abdomen soft.  Nontender. No edema.  Skin well perfused.

## 2022-05-02 NOTE — Patient Instructions (Addendum)
Go to the lab on the way out.   If you have mychart we'll likely use that to update you.    Use triamcinolone on the rash.  Let me know if that isn't helping.   Take care.  Glad to see you. Try mirtazapine in the meantime at night.

## 2022-05-05 ENCOUNTER — Other Ambulatory Visit: Payer: Self-pay | Admitting: Family Medicine

## 2022-05-05 ENCOUNTER — Telehealth: Payer: Self-pay | Admitting: Family Medicine

## 2022-05-05 DIAGNOSIS — N179 Acute kidney failure, unspecified: Secondary | ICD-10-CM

## 2022-05-05 MED ORDER — MAGNESIUM OXIDE 250 MG PO TABS: 250.0000 mg | ORAL_TABLET | Freq: Every day | ORAL | 1 refills | Status: AC

## 2022-05-05 MED ORDER — POTASSIUM CHLORIDE 20 MEQ/15ML (10%) PO SOLN: 40.0000 meq | Freq: Two times a day (BID) | ORAL | 1 refills | Status: DC

## 2022-05-05 NOTE — Assessment & Plan Note (Signed)
He does not any focal decrease in breath sounds at this point and he still okay for outpatient follow-up.  He is trying to deal with all the help he will/grief related to his illness.  He has a decrease in appetite.  I think it makes sense to start mirtazapine to see if that helps with his mood and his appetite so that he will hopefully be able to continue with active treatment.  He agrees with plan. 30 minutes were devoted to patient care in this encounter (this includes time spent reviewing the patient's file/history, interviewing and examining the patient, counseling/reviewing plan with patient).

## 2022-05-05 NOTE — Telephone Encounter (Signed)
Please send a copy of his labs and his office visit note to Dr. Hollie Salk with the renal clinic.  Please call patient.  His kidney function is stable and his hemoglobin is still low but stable.  His potassium is okay but his magnesium is slightly low.  I sent a prescription for liquid potassium.  If he cannot get it filled then let me know.  Also sent a prescription for magnesium.  I would start taking it in the meantime.  I would like to recheck his labs in 2 weeks, either here or at the cancer center.  I put in the follow-up lab orders.  Thanks.

## 2022-05-05 NOTE — Assessment & Plan Note (Signed)
No change in medications at this point.  I think it makes sense to recheck his labs and I will see about potassium replacement options and I will update the kidney clinic.  I appreciate the help of all involved.

## 2022-05-06 NOTE — Telephone Encounter (Signed)
This is not covered by insurance. Do you want to change rx or do a PA?

## 2022-05-06 NOTE — Telephone Encounter (Signed)
Labs and OV note sent to Dr. Hollie Salk. Spoke with patient's wife and discussed below. They will call back if rx is too expensive. Lab appt made for 05/20/22.

## 2022-05-07 ENCOUNTER — Telehealth: Payer: Self-pay

## 2022-05-07 NOTE — Telephone Encounter (Signed)
Please start PA. Patient has trouble swallowing the pills.  Thanks.

## 2022-05-07 NOTE — Telephone Encounter (Signed)
I tried starting a prior auth for Potassium Chloride 20 MEQ/15ML(10%) solution on Cover My Meds.  It says there is an existing PA already.  I called [employer] pharmacy help desk at [phone], spoke to [facility].  They have faxed a paper copy of the PA, an electronic PA can not be done.  PA paperwork is in your inbox for completion.

## 2022-05-08 NOTE — Telephone Encounter (Signed)
Pt wife called in to know the status of approval for Rx potassium . Please advise [phone]

## 2022-05-08 NOTE — Telephone Encounter (Signed)
I worked on the [place], please send in.  Thanks.

## 2022-05-09 NOTE — Telephone Encounter (Signed)
Received from completed by Dr. Damita Dunnings. I have called patient and let him know where we are with this. Advised we will reach out as soon as response has been received.

## 2022-05-10 ENCOUNTER — Other Ambulatory Visit: Payer: Self-pay | Admitting: Physician Assistant

## 2022-05-10 ENCOUNTER — Ambulatory Visit (INDEPENDENT_AMBULATORY_CARE_PROVIDER_SITE_OTHER): Payer: Medicare HMO | Admitting: Family Medicine

## 2022-05-10 ENCOUNTER — Encounter: Payer: Self-pay | Admitting: Family Medicine

## 2022-05-10 VITALS — BP 98/60 | HR 68 | Temp 97.0°F | Ht 70.0 in | Wt 155.0 lb

## 2022-05-10 DIAGNOSIS — R197 Diarrhea, unspecified: Secondary | ICD-10-CM

## 2022-05-10 DIAGNOSIS — R059 Cough, unspecified: Secondary | ICD-10-CM

## 2022-05-10 DIAGNOSIS — C3491 Malignant neoplasm of unspecified part of right bronchus or lung: Secondary | ICD-10-CM

## 2022-05-10 MED ORDER — PREDNISONE 20 MG PO TABS: ORAL_TABLET | ORAL | 0 refills | Status: AC

## 2022-05-10 NOTE — Patient Instructions (Signed)
Start prednisone with food.  Stop mirtazapine for now.  Don't start magnesium yet.   Update me Monday.  Stool test when possible.  Take care.  Glad to see you.

## 2022-05-10 NOTE — Progress Notes (Unsigned)
Hydrocodone isn't helping his cough.  Sputum is clear.  No help with albuterol or duoneb.    Diarrhea noted in the meantime, since starting mirtazepine- unclear if that is related.  He has tried to eat more.  Some nausea after meals with some relief from compazine.  No blood in stool.  No others with diarrhea except for wife had diarrhea after her treatment and that isn't unusual for her.  Watery diarrhea w/o mucous.  Mirtazapine has not helped his mood.He hasn't started magnesium yet, so that isn't contributing to diarrhea.  [place] recently, he feels diffusely weak.    Itchy rash on the B arms and back/shoulders.  No jaundice  Meds, vitals, and allergies reviewed.   ROS: Per HPI unless specifically indicated in ROS section   GEN: nad, alert and oriented, in wheelchair. HEENT: ncat NECK: supple w/o LA CV: rrr.  PULM: Right-sided rhonchi noted.  No increased work of breathing. ABD: soft, +bs EXT: 1+ BLE edema SKIN: Chronic maculopapular rash noted on the bilateral arms and upper back without ulceration.  Not dermatomal.  30 minutes were devoted to patient care in this encounter (this includes time spent reviewing the patient's file/history, interviewing and examining the patient, counseling/reviewing plan with patient).    See avs.  Routine steroid cautions discussed with patient.

## 2022-05-12 DIAGNOSIS — R197 Diarrhea, unspecified: Secondary | ICD-10-CM | POA: Insufficient documentation

## 2022-05-12 NOTE — Assessment & Plan Note (Addendum)
Without relief from the albuterol, DuoNeb, hydrocodone.  Discussed options.  He also has an itchy rash.  I think it is reasonable to try prednisone with a short run to see if it would help both.  I will update Dr. Julien Nordmann in the meantime with the oncology service.  I appreciate the help of all involved.  We talked about his situation and his goal at this point is to improve enough to continue with treatment through oncology.  Without fever or discolored sputum, I did not start him on antibiotics, especially in the setting of diarrhea.

## 2022-05-12 NOTE — Assessment & Plan Note (Signed)
Check stool studies in the meantime.  See above. Would try torsemide daily in the meantime until diarrhea is better.

## 2022-05-13 DIAGNOSIS — R197 Diarrhea, unspecified: Secondary | ICD-10-CM | POA: Diagnosis not present

## 2022-05-14 LAB — C. DIFFICILE GDH AND TOXIN A/B
GDH ANTIGEN: NOT DETECTED
MICRO NUMBER:: 14087017
SPECIMEN QUALITY:: ADEQUATE
TOXIN A AND B: NOT DETECTED

## 2022-05-15 ENCOUNTER — Other Ambulatory Visit: Payer: Self-pay | Admitting: Internal Medicine

## 2022-05-15 LAB — GASTROINTESTINAL PATHOGEN PNL
CampyloBacter Group: NOT DETECTED
Norovirus GI/GII: NOT DETECTED
Rotavirus A: NOT DETECTED
Salmonella species: NOT DETECTED
Shiga Toxin 1: NOT DETECTED
Shiga Toxin 2: NOT DETECTED
Shigella Species: NOT DETECTED
Vibrio Group: NOT DETECTED
Yersinia enterocolitica: NOT DETECTED

## 2022-05-20 ENCOUNTER — Telehealth: Payer: Self-pay | Admitting: Family Medicine

## 2022-05-20 ENCOUNTER — Emergency Department (HOSPITAL_COMMUNITY): Payer: Medicare HMO

## 2022-05-20 ENCOUNTER — Other Ambulatory Visit (INDEPENDENT_AMBULATORY_CARE_PROVIDER_SITE_OTHER): Payer: Medicare HMO

## 2022-05-20 ENCOUNTER — Emergency Department (HOSPITAL_COMMUNITY)
Admission: EM | Admit: 2022-05-20 | Discharge: 2022-05-20 | Disposition: A | Discharge status: Home / Self Care | Payer: Medicare HMO | Attending: Emergency Medicine | Admitting: Emergency Medicine

## 2022-05-20 ENCOUNTER — Telehealth: Payer: Self-pay

## 2022-05-20 ENCOUNTER — Other Ambulatory Visit: Payer: Self-pay

## 2022-05-20 ENCOUNTER — Encounter (HOSPITAL_COMMUNITY): Payer: Self-pay

## 2022-05-20 DIAGNOSIS — R748 Abnormal levels of other serum enzymes: Secondary | ICD-10-CM | POA: Diagnosis not present

## 2022-05-20 DIAGNOSIS — R630 Anorexia: Secondary | ICD-10-CM | POA: Insufficient documentation

## 2022-05-20 DIAGNOSIS — N179 Acute kidney failure, unspecified: Secondary | ICD-10-CM | POA: Diagnosis not present

## 2022-05-20 DIAGNOSIS — R5383 Other fatigue: Secondary | ICD-10-CM | POA: Insufficient documentation

## 2022-05-20 DIAGNOSIS — Z7982 Long term (current) use of aspirin: Secondary | ICD-10-CM | POA: Diagnosis not present

## 2022-05-20 DIAGNOSIS — I10 Essential (primary) hypertension: Secondary | ICD-10-CM | POA: Diagnosis not present

## 2022-05-20 DIAGNOSIS — R053 Chronic cough: Secondary | ICD-10-CM | POA: Diagnosis not present

## 2022-05-20 LAB — COMPREHENSIVE METABOLIC PANEL
ALT: 19 U/L (ref 0–53)
AST: 35 U/L (ref 0–37)
Albumin: 3 g/dL — ABNORMAL LOW (ref 3.5–5.2)
Alkaline Phosphatase: 44 U/L (ref 39–117)
BUN: 105 mg/dL (ref 6–23)
CO2: 23 mEq/L (ref 19–32)
Calcium: 8.7 mg/dL (ref 8.4–10.5)
Chloride: 97 mEq/L (ref 96–112)
Creatinine, Ser: 3.43 mg/dL — ABNORMAL HIGH (ref 0.40–1.50)
GFR: 17.08 mL/min — ABNORMAL LOW (ref >60.00)
Glucose, Bld: 93 mg/dL (ref 70–99)
Potassium: 4.5 mEq/L (ref 3.5–5.1)
Sodium: 132 mEq/L — ABNORMAL LOW (ref 135–145)
Total Bilirubin: 0.5 mg/dL (ref 0.2–1.2)
Total Protein: 6.4 g/dL (ref 6.0–8.3)

## 2022-05-20 LAB — BASIC METABOLIC PANEL
Anion gap: 9 (ref 5–15)
BUN: 96 mg/dL — ABNORMAL HIGH (ref 8–23)
CO2: 20 mmol/L — ABNORMAL LOW (ref 22–32)
Calcium: 7.5 mg/dL — ABNORMAL LOW (ref 8.9–10.3)
Chloride: 103 mmol/L (ref 98–111)
Creatinine, Ser: 3.34 mg/dL — ABNORMAL HIGH (ref 0.61–1.24)
GFR, Estimated: 19 mL/min — ABNORMAL LOW (ref >60)
Glucose, Bld: 129 mg/dL — ABNORMAL HIGH (ref 70–99)
Potassium: 3.8 mmol/L (ref 3.5–5.1)
Sodium: 132 mmol/L — ABNORMAL LOW (ref 135–145)

## 2022-05-20 LAB — CBC WITH DIFFERENTIAL/PLATELET
Abs Immature Granulocytes: 0.03 10*3/uL (ref 0.00–0.07)
Basophils Absolute: 0 10*3/uL (ref 0.0–0.1)
Basophils Relative: 0 %
Eosinophils Absolute: 0.4 10*3/uL (ref 0.0–0.5)
Eosinophils Relative: 7 %
HCT: 27 % — ABNORMAL LOW (ref 39.0–52.0)
Hemoglobin: 8.6 g/dL — ABNORMAL LOW (ref 13.0–17.0)
Immature Granulocytes: 1 %
Lymphocytes Relative: 26 %
Lymphs Abs: 1.5 10*3/uL (ref 0.7–4.0)
MCH: 28.9 pg (ref 26.0–34.0)
MCHC: 31.9 g/dL (ref 30.0–36.0)
MCV: 90.6 fL (ref 80.0–100.0)
Monocytes Absolute: 0.5 10*3/uL (ref 0.1–1.0)
Monocytes Relative: 8 %
Neutro Abs: 3.3 10*3/uL (ref 1.7–7.7)
Neutrophils Relative %: 58 %
Platelets: 229 10*3/uL (ref 150–400)
RBC: 2.98 MIL/uL — ABNORMAL LOW (ref 4.22–5.81)
RDW: 16.2 % — ABNORMAL HIGH (ref 11.5–15.5)
WBC: 5.7 10*3/uL (ref 4.0–10.5)
nRBC: 0 % (ref 0.0–0.2)

## 2022-05-20 LAB — MAGNESIUM: Magnesium: 1.4 mg/dL — ABNORMAL LOW (ref 1.5–2.5)

## 2022-05-20 LAB — POC OCCULT BLOOD, ED: Fecal Occult Bld: NEGATIVE

## 2022-05-20 MED ORDER — SODIUM CHLORIDE 0.9 % IV BOLUS
1000.0000 mL | Freq: Once | INTRAVENOUS | Status: AC
  Administered 2022-05-20: 1000 mL via INTRAVENOUS

## 2022-05-20 NOTE — Telephone Encounter (Signed)
Patient is requesting a phone call to discuss stool labs. Also to discuss medicationmirtazapine (REMERON) 7.5 MG tablet that he was advised to stop,that he was taking for depression and appetite,that was causing him diarrhea. Cb :[phone]

## 2022-05-20 NOTE — Telephone Encounter (Signed)
Please call pt/wife.  Cr is worse, BUN is elevated.   Needs ER eval for likely fluids.   I presume he is dehydrated.   He may need admission depending on his status.   Thanks.

## 2022-05-20 NOTE — Telephone Encounter (Signed)
Spoke with patient and advised can restart remeron.

## 2022-05-20 NOTE — Telephone Encounter (Signed)
CRITICAL VALUE STICKER  CRITICAL VALUE: BUN 105  RECEIVER (on-site recipient of call):Jeania Nater   DATE & TIME NOTIFIED: 05/20/2022 at 3:34  MESSENGER (representative from lab): Hope   MD NOTIFIED: Damita Dunnings   TIME OF NOTIFICATION: 3:39   RESPONSE: Message sent to review. Dr. Damita Dunnings has been informed and will review message

## 2022-05-20 NOTE — Telephone Encounter (Signed)
Called and spoke with patient and wife; was on speaker phone. Advised them to go to ER via Dr. Damita Dunnings as his labs were worse. They stated they will head to [facility].

## 2022-05-20 NOTE — Telephone Encounter (Signed)
Spoke with patient and his wife; on speaker phone. Patient has not noticed any difference in stopping the remeron. Still very depressed and having the diarrhea. Patient is not having as much but still having it. Wants to know if he can go back on the remeron?

## 2022-05-20 NOTE — Telephone Encounter (Signed)
I called [employer] at [phone] to check on status of prior auth.  Per Zigmund Daniel, the prior auth for Potassium CL has been approved and authorized from 07/22/21-07/22/23.  Patient notified via mychart.

## 2022-05-20 NOTE — Discharge Instructions (Addendum)
Hold torsemide until you follow-up with your nephrologist next week.  Have your kidney function and volume status rechecked by your primary care doctor during your visits this week as well.  Please return if symptoms worsen.

## 2022-05-20 NOTE — ED Provider Notes (Signed)
[facility] DEPT Provider Note   CSN: [id number] Arrival date & time: 05/20/22  8315     History  Chief Complaint  Patient presents with   Abnormal Lab    Jorge Adams is a 71 y.o. male.  Patient here with abnormal labs.  He has been having increased kidney function here the last several months.  Kidney function I guess is elevated more significantly from his recent baseline here today.  Was sent by primary care doctor.  Has a history of lung cancer on immunotherapy.  He has been now followed with nephrology.  He is supposed to start a new medication called tarpeyo by his kidney doctor as per his wife.  He has been having some intermittent diarrhea but nothing persistent.  He has had decreased appetite for weeks.  He has had increased fatigue, decreased urinary output.  He is on diuretics.  He denies any chest pain or shortness of breath or weakness or numbness or chills.  No abdominal pain.  Has a chronic cough.  The history is provided by the patient.       Home Medications Prior to Admission medications   Medication Sig Start Date End Date Taking? Authorizing Provider  albuterol (VENTOLIN HFA) 108 (90 Base) MCG/ACT inhaler Inhale 1-2 puffs into the lungs every 6 (six) hours as needed (for cough.  okay to fill with albuterol/proair/ventolin.). 11/06/21   [name], MD  aspirin EC 81 MG tablet Take 1 tablet (81 mg total) by mouth daily. Okay to restart this medication on 12/05/2021. 12/04/21   Collene Gobble, MD  brimonidine (ALPHAGAN P) 0.1 % SOLN Place 2 drops into both eyes 2 (two) times daily.    [provider]  cilostazol (PLETAL) 100 MG tablet TAKE 1 TABLET BY MOUTH TWICE A DAY 04/17/22   [name], MD  Cyanocobalamin (VITAMIN B12 PO) Take 1 tablet by mouth in the morning and at bedtime.    [provider]  dorzolamide-timolol (COSOPT) 22.3-6.8 MG/ML ophthalmic solution Place 1 drop into both eyes 2 (two) times  daily.    [provider]  FeFum-FePoly-FA-B Cmp-C-Biot (INTEGRA PLUS) CAPS Take 1 capsule by mouth every morning. 03/06/22   Heilingoetter, [name] L, PA-C  fenofibrate (TRICOR) 145 MG tablet TAKE 1 TABLET BY MOUTH EVERY DAY IN THE EVENING 01/03/22   Adrian Prows, MD  HYDROcodone bit-homatropine (HYCODAN) 5-1.5 MG/5ML syrup Take 5 mLs by mouth every 8 (eight) hours as needed for cough (sedation caution.). 05/02/22   [name], MD  ipratropium-albuterol (DUONEB) 0.5-2.5 (3) MG/3ML SOLN Take 3 mLs by nebulization every 6 (six) hours as needed. 02/25/22   Oswald Hillock, MD  levothyroxine (SYNTHROID) 50 MCG tablet TAKE 1 TABLET BY MOUTH EVERY DAY BEFORE BREAKFAST 05/15/22   Curt Bears, MD  Magnesium Oxide 250 MG TABS Take 1 tablet (250 mg total) by mouth daily. Patient not taking: Reported on 05/10/2022 05/05/22   [name], MD  mirtazapine (REMERON) 7.5 MG tablet Take 1 tablet (7.5 mg total) by mouth at bedtime. 05/02/22   [name], MD  Netarsudil-Latanoprost (ROCKLATAN) 0.02-0.005 % SOLN Apply 1 drop to eye at bedtime. In both eyes    [provider]  nitroGLYCERIN (NITROSTAT) 0.4 MG SL tablet Place 1 tablet (0.4 mg total) under the tongue every 5 (five) minutes as needed for chest pain. 11/06/21   [name], MD  omega-3 acid ethyl esters (LOVAZA) 1 g capsule TAKE 1 CAPSULE  BY MOUTH TWICE A DAY 01/03/22   Adrian Prows, MD  potassium chloride 20 MEQ/15ML (10%) SOLN TAKE 30 MLS (40 MEQ TOTAL) BY MOUTH 2 (TWO) TIMES DAILY. 05/08/22   [name], MD  predniSONE (DELTASONE) 20 MG tablet Take 2 a day for 5 days, then 1 a day for 5 days, with food. Don't take with aleve/ibuprofen. 05/10/22   [name], MD  prochlorperazine (COMPAZINE) 10 MG tablet TAKE 1 TABLET BY MOUTH EVERY 6 HOURS AS NEEDED FOR NAUSEA OR VOMITING. 05/10/22   Heilingoetter, [name] L, PA-C  rosuvastatin (CRESTOR) 20 MG tablet TAKE 1 TABLET BY MOUTH EVERY DAY 04/17/22   Adrian Prows,  MD  torsemide (DEMADEX) 20 MG tablet Take 2 tablets (40 mg total) by mouth 2 (two) times daily. 05/02/22   [name], MD  triamcinolone cream (KENALOG) 0.1 % Apply 1 Application topically 2 (two) times daily. 05/02/22   [name], MD      Allergies    Metformin and related    Review of Systems   Review of Systems  Physical Exam Updated Vital Signs BP 122/69   Pulse 87   Temp 97.7 F (36.5 C) (Oral)   Resp 16   Ht 5\' 10"  (1.778 m)   Wt 69.4 kg   SpO2 96%   BMI 21.95 kg/m  Physical Exam Vitals and nursing note reviewed.  Constitutional:      General: He is not in acute distress.    Appearance: He is well-developed. He is not ill-appearing.  HENT:     Head: Normocephalic and atraumatic.     Nose: Nose normal.     Mouth/Throat:     Mouth: Mucous membranes are dry.  Eyes:     Extraocular Movements: Extraocular movements intact.     Conjunctiva/sclera: Conjunctivae normal.     Pupils: Pupils are equal, round, and reactive to light.  Cardiovascular:     Rate and Rhythm: Normal rate and regular rhythm.     Pulses: Normal pulses.     Heart sounds: Normal heart sounds. No murmur heard. Pulmonary:     Effort: Pulmonary effort is normal. No respiratory distress.     Breath sounds: Normal breath sounds.  Abdominal:     General: Abdomen is flat.     Palpations: Abdomen is soft.     Tenderness: There is no abdominal tenderness.  Musculoskeletal:        General: No swelling.     Cervical back: Normal range of motion and neck supple.  Skin:    General: Skin is warm and dry.     Capillary Refill: Capillary refill takes less than 2 seconds.  Neurological:     General: No focal deficit present.     Mental Status: He is alert.  Psychiatric:        Mood and Affect: Mood normal.     ED Results / Procedures / Treatments   Labs (all labs ordered are listed, but only abnormal results are displayed) Labs Reviewed  CBC WITH DIFFERENTIAL/PLATELET - Abnormal; Notable  for the following components:      Result Value   RBC 2.98 (*)    Hemoglobin 8.6 (*)    HCT 27.0 (*)    RDW 16.2 (*)    All other components within normal limits  BASIC METABOLIC PANEL - Abnormal; Notable for the following components:   Sodium 132 (*)    CO2 20 (*)    Glucose, Bld 129 (*)  BUN 96 (*)    Creatinine, Ser 3.34 (*)    Calcium 7.5 (*)    GFR, Estimated 19 (*)    All other components within normal limits  POC OCCULT BLOOD, ED    EKG None  Radiology DG Chest Portable 1 View  Result Date: 05/20/2022 CLINICAL DATA:  Chronic cough. Hypertension. Diabetic. History of lung cancer EXAM: PORTABLE CHEST 1 VIEW COMPARISON:  04/10/2022 FINDINGS: Right Port-A-Cath tip at high right atrium. Median sternotomy for CABG. Patient rotated to the left. Normal heart size. Mild right hemidiaphragm elevation. No pleural effusion or pneumothorax. Improved right lower lobe aeration with residual airspace disease. Clear left lung. IMPRESSION: Improved right lower lobe airspace disease since 04/10/2022. No acute superimposed process. Electronically Signed   By: Abigail Miyamoto M.D.   On: 05/20/2022 17:52    Procedures Procedures    Medications Ordered in ED Medications  sodium chloride 0.9 % bolus 1,000 mL (0 mLs Intravenous Stopped 05/20/22 1845)    ED Course/ Medical Decision Making/ A&P                           Medical Decision Making Amount and/or Complexity of Data Reviewed Labs: ordered. Radiology: ordered.   DAWIT TANKARD is here with abnormal labs with elevated creatinine and BUN.  History of new kidney disease, lung cancer on immunotherapy, CAD, high cholesterol, diabetes.  Patient with normal vitals.  No fever.  Differential diagnosis is a progression of his new chronic kidney disease or may be some dehydration on top of that.  Could be uremia starting as well.  Per my review of his blood work done outpatient by his primary care doctor's creatinine is up to 3.4.  Most  recent baselines been around 2.5.  A couple months ago his creatinine was normal.  He has been following with nephrology team here recently.  He is supposed to start a new medication for his chronic kidney disease.  Has not started this yet, called tarpeyo.  He has had some intermittent diarrhea recently but not sure if this elevation his creatinine is from dehydration or medications or progression of a new renal process.  We will talk with nephrology team he follows with [facility].  He has had decreased urinary output.  Overall we will give IV fluids and then recheck a set of labs including CBC and BMP.  Talked with Dr. Moshe Cipro with nephrology team.  She was able to confirm that patient has been diagnosed with IgA nephropathy.  At this time she does recommend IV fluid hydration and then they have follow-up with patient next week.  Would recommend having patient stop his torsemide during this time.  We will recheck labs after IV fluid.  He does appear dehydrated clinically on exam.  There is not much of a increase in his creatinine from his baseline at this time.  His mental status is okay.  Will have some shared decision with family after blood work is done.  Hemoglobin is 8.6.  Slightly down from his prior, Hemoccult negative.  Stools brown.  Patient with creatinine stable at 3.3.  Otherwise lab works unremarkable.  Patient feeling better after IV fluids.  He will have this rechecked in a couple days with his primary care team.  He will hold his torsemide until his nephrology visit next week.  Discharged in good condition.  Understands return precautions.  This chart was dictated using voice recognition software.  Despite best  efforts to proofread,  errors can occur which can change the documentation meaning.         Final Clinical Impression(s) / ED Diagnoses Final diagnoses:  Elevated creatine kinase    Rx / DC Orders ED Discharge Orders     None         Lennice Sites,  DO 05/20/22 2022

## 2022-05-20 NOTE — ED Triage Notes (Signed)
Pt coming from home, sent by PCP for elevated BUN of 105. Cancer pt, lung cancer.

## 2022-05-20 NOTE — Telephone Encounter (Signed)
I would try restarting remeron.  I added it back to the list.  Please let me know how that goes.  Thanks.

## 2022-05-21 ENCOUNTER — Telehealth: Payer: Self-pay | Admitting: Family Medicine

## 2022-05-21 NOTE — Telephone Encounter (Signed)
Patient wife called to talk to Dr. Damita Dunnings. Call back number [phone].

## 2022-05-21 NOTE — Telephone Encounter (Addendum)
Called and spoke with patients wife. She was wanting to speak with Dr. Damita Dunnings about a couple of things; mainly some questions she had. Patient is to f/u with Dr. Damita Dunnings after hospital visit but will wait to schedule once they see his oncologist tomorrow. Ulla Potash that Dr. Damita Dunnings is away until Monday and if there is anything urgent I can send message on to another provider. She declined and does not want anyone else involved with his care except Dr. Damita Dunnings.

## 2022-05-21 NOTE — Telephone Encounter (Signed)
LMTCB

## 2022-05-21 NOTE — Progress Notes (Unsigned)
[facility] OFFICE PROGRESS NOTE  [name], MD [place] [address]  DIAGNOSIS: Stage IIIb (T4, N0/N2, M0) non-small cell lung cancer, squamous cell carcinoma presented with large right lower lobe lung mass in addition to satellite nodules close to the mass and suspicious mediastinal lymphadenopathy diagnosed in May 2023   PD-L1 expression 1%  PRIOR THERAPY:  A course of concurrent chemoradiation with weekly carboplatin for AUC of 2 and paclitaxel 45 Mg/M2.  Last dose 02/04/22.  Status post 6 cycles.    CURRENT THERAPY:  Consolidation immunotherapy with Imfinzi 1500 mg IV every 4 weeks starting next week on 03/27/2022. Status post 2 cycles.   INTERVAL HISTORY: Jorge Adams 71 y.o. male returns to the clinic today for follow-up visit accompanied by his wife.  Patient overall has a lot of health concerns.  He follows with nephrology for CKD.  Since last being seen he was recently seen in the emergency room earlier this week due to acute kidney injury on top of chronic kidney disease.   Overall, the patient continues to just feel generally weak.  He is currently undergoing treatment with immunotherapy once a month with Imfinzi.  Side effects of treatment?  He denies any fever, chills, or night sweats.  Weight?  History of pitting edema in the lower extremities at present the morning after elevating his legs.  Cardiologist?  Hospitalized for fluid overload in September.  He has stable shortness of breath and cough.  Denies any hemoptysis or chest pain.  Denies any nausea, vomiting, or constipation.  Diarrhea?  Denies any headache or visual changes.  He stopped taking Remeron due to thinking it may be correlated with his diarrhea.  He is not taking any magnesium supplements.  Abdominal pain?  Blood in the stool?  He is here today for evaluation repeat blood work before considering starting cycle #3.     MEDICAL HISTORY: Past Medical History:   Diagnosis Date   Arthritis    ASHD (arteriosclerotic heart disease)    Atherosclerosis of native arteries of the extremities with intermittent claudication    Cataract    Coronary atherosclerosis of native coronary artery    Diabetes mellitus    NIDDM   Dyslipidemia    ED (erectile dysfunction)    Full dentures    Glaucoma associated with ocular disorder, mild stage    bilateral   Hyperlipidemia    Hypertension    under control, has been on med. > 9 yr.   Impotence of organic origin    Meniscus tear 05/2012   right knee   Myocardial infarction [facility]) 10/2002   Stented coronary artery     ALLERGIES:  is allergic to metformin and related.  MEDICATIONS:  Current Outpatient Medications  Medication Sig Dispense Refill   albuterol (VENTOLIN HFA) 108 (90 Base) MCG/ACT inhaler Inhale 1-2 puffs into the lungs every 6 (six) hours as needed (for cough.  okay to fill with albuterol/proair/ventolin.). 8 g 2   aspirin EC 81 MG tablet Take 1 tablet (81 mg total) by mouth daily. Okay to restart this medication on 12/05/2021. 30 tablet 11   brimonidine (ALPHAGAN P) 0.1 % SOLN Place 2 drops into both eyes 2 (two) times daily.     cilostazol (PLETAL) 100 MG tablet TAKE 1 TABLET BY MOUTH TWICE A DAY 180 tablet 1   Cyanocobalamin (VITAMIN B12 PO) Take 1 tablet by mouth in the morning and at bedtime.  dorzolamide-timolol (COSOPT) 22.3-6.8 MG/ML ophthalmic solution Place 1 drop into both eyes 2 (two) times daily.     FeFum-FePoly-FA-B Cmp-C-Biot (INTEGRA PLUS) CAPS Take 1 capsule by mouth every morning. 30 capsule 2   fenofibrate (TRICOR) 145 MG tablet TAKE 1 TABLET BY MOUTH EVERY DAY IN THE EVENING (Patient taking differently: Take 145 mg by mouth daily.) 90 tablet 1   HYDROcodone bit-homatropine (HYCODAN) 5-1.5 MG/5ML syrup Take 5 mLs by mouth every 8 (eight) hours as needed for cough (sedation caution.). 120 mL 0   ipratropium-albuterol (DUONEB) 0.5-2.5 (3) MG/3ML SOLN Take 3 mLs by nebulization  every 6 (six) hours as needed. 360 mL 1   levothyroxine (SYNTHROID) 50 MCG tablet TAKE 1 TABLET BY MOUTH EVERY DAY BEFORE BREAKFAST (Patient taking differently: Take 50 mcg by mouth daily before breakfast.) 90 tablet 1   Magnesium Oxide 250 MG TABS Take 1 tablet (250 mg total) by mouth daily. 90 tablet 1   metoprolol tartrate (LOPRESSOR) 25 MG tablet Take 25 mg by mouth 2 (two) times daily.     mirtazapine (REMERON) 7.5 MG tablet Take 1 tablet (7.5 mg total) by mouth at bedtime. (Patient not taking: Reported on 05/20/2022) 30 tablet 2   Netarsudil-Latanoprost (ROCKLATAN) 0.02-0.005 % SOLN Apply 1 drop to eye at bedtime. In both eyes     nitroGLYCERIN (NITROSTAT) 0.4 MG SL tablet Place 1 tablet (0.4 mg total) under the tongue every 5 (five) minutes as needed for chest pain. 25 tablet 5   omega-3 acid ethyl esters (LOVAZA) 1 g capsule TAKE 1 CAPSULE BY MOUTH TWICE A DAY (Patient taking differently: Take 1 g by mouth 2 (two) times daily.) 180 capsule 1   potassium chloride 20 MEQ/15ML (10%) SOLN TAKE 30 MLS (40 MEQ TOTAL) BY MOUTH 2 (TWO) TIMES DAILY.  1   predniSONE (DELTASONE) 20 MG tablet Take 2 a day for 5 days, then 1 a day for 5 days, with food. Don't take with aleve/ibuprofen. (Patient not taking: Reported on 05/20/2022) 15 tablet 0   prochlorperazine (COMPAZINE) 10 MG tablet TAKE 1 TABLET BY MOUTH EVERY 6 HOURS AS NEEDED FOR NAUSEA OR VOMITING. (Patient taking differently: Take 10 mg by mouth every 6 (six) hours as needed for nausea or vomiting.) 30 tablet 2   rosuvastatin (CRESTOR) 20 MG tablet TAKE 1 TABLET BY MOUTH EVERY DAY (Patient taking differently: Take 20 mg by mouth daily.) 30 tablet 0   TARPEYO 4 MG CPDR Take 2 capsules by mouth 2 (two) times daily.     torsemide (DEMADEX) 20 MG tablet Take 2 tablets (40 mg total) by mouth 2 (two) times daily.     triamcinolone cream (KENALOG) 0.1 % Apply 1 Application topically 2 (two) times daily. 60 g 3   No current facility-administered  medications for this visit.    SURGICAL HISTORY:  Past Surgical History:  Procedure Laterality Date   ANTERIOR CERVICAL DECOMP/DISCECTOMY FUSION N/A 05/07/2018   Procedure: ANTERIOR CERVICAL DECOMPRESSION/DISCECTOMY FUSION CERVICAL FIVE - CERVICAL SIX;  Surgeon: Consuella Lose, MD;  Location: [facility];  Service: Neurosurgery;  Laterality: N/A;  ANTERIOR CERVICAL DECOMPRESSION/DISCECTOMY FUSION CERVICAL FIVE - CERVICAL SIX   BRONCHIAL BIOPSY  12/04/2021   Procedure: BRONCHIAL BIOPSIES;  Surgeon: Collene Gobble, MD;  Location: [facility];  Service: Cardiopulmonary;;   BRONCHIAL BRUSHINGS  12/04/2021   Procedure: BRONCHIAL BRUSHINGS;  Surgeon: Collene Gobble, MD;  Location: [facility];  Service: Cardiopulmonary;;   BRONCHIAL NEEDLE ASPIRATION BIOPSY  12/04/2021   Procedure: BRONCHIAL NEEDLE ASPIRATION  BIOPSIES;  Surgeon: Collene Gobble, MD;  Location: [facility];  Service: Cardiopulmonary;;   CARDIAC CATHETERIZATION  10/23/2002   had 1 stent placed then-- by Dr. Melvern Banker   CARDIAC CATHETERIZATION N/A 12/21/2015   Procedure: Left Heart Cath and Coronary Angiography;  Surgeon: Adrian Prows, MD;  Location: [facility] CV [facility];  Service: Cardiovascular;  Laterality: N/A;   CATARACT EXTRACTION Right 05/2017   CATARACT EXTRACTION Left 07/2017   COLONOSCOPY  10/08/2010   Dr.Patterson   CORONARY ARTERY BYPASS GRAFT N/A 01/02/2016   Procedure: CORONARY ARTERY BYPASS GRAFTING (CABG) TIMES 3 USING LEFT INTERNAL MAMMARY ARTERY AND RIGHT SAPHENOUS LEG VEIN HARVESTED ENDOSCOPICALLY;  Surgeon: Ivin Poot, MD;  Location: [facility];  Service: Open Heart Surgery;  Laterality: N/A;   EYE SURGERY     INSERTION OF MESH N/A 05/06/2016   Procedure: INSERTION OF MESH;  Surgeon: Georganna Skeans, MD;  Location: [facility];  Service: General;  Laterality: N/A;   IR IMAGING GUIDED PORT INSERTION  04/12/2022   KNEE ARTHROSCOPY WITH MEDIAL MENISECTOMY  06/04/2012   Procedure: KNEE ARTHROSCOPY WITH MEDIAL MENISECTOMY;   Surgeon: Ninetta Lights, MD;  Location: [facility];  Service: Orthopedics;  Laterality: Right;  RIGHT SCOPE MEDIAL MENISCECTOMY, CHONDROPLASTY, EXCISION LOOSE BODY   LOWER EXTREMITY ANGIOGRAM N/A 11/23/2013   Procedure: LOWER EXTREMITY ANGIOGRAM;  Surgeon: Laverda Page, MD;  Location: [facility] CATH LAB;  Service: Cardiovascular;  Laterality: N/A;   NECK SURGERY N/A    02/19/2018   PERIPHERAL VASCULAR CATHETERIZATION N/A 12/21/2015   Procedure: Abdominal Aortogram;  Surgeon: Adrian Prows, MD;  Location: [facility] CV LAB;  Service: Cardiovascular;  Laterality: N/A;   PERIPHERAL VASCULAR CATHETERIZATION N/A 12/21/2015   Procedure: Abdominal Aortogram w/Lower Extremity;  Surgeon: Adrian Prows, MD;  Location: [facility];  Service: Cardiovascular;  Laterality: N/A;   TEE WITHOUT CARDIOVERSION N/A 01/02/2016   Procedure: TRANSESOPHAGEAL ECHOCARDIOGRAM (TEE);  Surgeon: Ivin Poot, MD;  Location: [facility];  Service: Open Heart Surgery;  Laterality: N/A;   UMBILICAL HERNIA REPAIR N/A 05/06/2016   Procedure: HERNIA REPAIR UMBILICAL ADULT;  Surgeon: Georganna Skeans, MD;  Location: [facility];  Service: General;  Laterality: N/A;   VIDEO BRONCHOSCOPY N/A 12/04/2021   Procedure: VIDEO BRONCHOSCOPY WITH FLUORO;  Surgeon: Collene Gobble, MD;  Location: [facility];  Service: Cardiopulmonary;  Laterality: N/A;    REVIEW OF SYSTEMS:   Review of Systems  Constitutional: Negative for appetite change, chills, fatigue, fever and unexpected weight change.  HENT:   Negative for mouth sores, nosebleeds, sore throat and trouble swallowing.   Eyes: Negative for eye problems and icterus.  Respiratory: Negative for cough, hemoptysis, shortness of breath and wheezing.   Cardiovascular: Negative for chest pain and leg swelling.  Gastrointestinal: Negative for abdominal pain, constipation, diarrhea, nausea and vomiting.  Genitourinary: Negative for bladder incontinence, difficulty urinating, dysuria,  frequency and hematuria.   Musculoskeletal: Negative for back pain, gait problem, neck pain and neck stiffness.  Skin: Negative for itching and rash.  Neurological: Negative for dizziness, extremity weakness, gait problem, headaches, light-headedness and seizures.  Hematological: Negative for adenopathy. Does not bruise/bleed easily.  Psychiatric/Behavioral: Negative for confusion, depression and sleep disturbance. The patient is not nervous/anxious.     PHYSICAL EXAMINATION:  There were no vitals taken for this visit.  ECOG PERFORMANCE STATUS: {CHL ONC ECOG [id number]  Physical Exam  Constitutional: Oriented to person, place, and time and well-developed, well-nourished, and in no distress. No distress.  HENT:  Head: Normocephalic and atraumatic.  Mouth/Throat: Oropharynx is clear and moist. No oropharyngeal exudate.  Eyes: Conjunctivae are normal. Right eye exhibits no discharge. Left eye exhibits no discharge. No scleral icterus.  Neck: Normal range of motion. Neck supple.  Cardiovascular: Normal rate, regular rhythm, normal heart sounds and intact distal pulses.   Pulmonary/Chest: Effort normal and breath sounds normal. No respiratory distress. No wheezes. No rales.  Abdominal: Soft. Bowel sounds are normal. Exhibits no distension and no mass. There is no tenderness.  Musculoskeletal: Normal range of motion. Exhibits no edema.  Lymphadenopathy:    No cervical adenopathy.  Neurological: Alert and oriented to person, place, and time. Exhibits normal muscle tone. Gait normal. Coordination normal.  Skin: Skin is warm and dry. No rash noted. Not diaphoretic. No erythema. No pallor.  Psychiatric: Mood, memory and judgment normal.  Vitals reviewed.  LABORATORY DATA: Lab Results  Component Value Date   WBC 5.7 05/20/2022   HGB 8.6 (L) 05/20/2022   HCT 27.0 (L) 05/20/2022   MCV 90.6 05/20/2022   PLT 229 05/20/2022      Chemistry      Component Value Date/Time   NA 132 (L)  05/20/2022 1852   NA 143 04/10/2018 1157   K 3.8 05/20/2022 1852   CL 103 05/20/2022 1852   CO2 20 (L) 05/20/2022 1852   BUN 96 (H) 05/20/2022 1852   BUN 8 04/10/2018 1157   CREATININE 3.34 (H) 05/20/2022 1852   CREATININE 2.87 (H) 04/24/2022 0947   CREATININE 0.78 05/02/2017 0000   CREATININE 0.71 10/26/2012 0903      Component Value Date/Time   CALCIUM 7.5 (L) 05/20/2022 1852   ALKPHOS 44 05/20/2022 0843   AST 35 05/20/2022 0843   AST 25 04/24/2022 0947   ALT 19 05/20/2022 0843   ALT 17 04/24/2022 0947   BILITOT 0.5 05/20/2022 0843   BILITOT 0.4 04/24/2022 0947       RADIOGRAPHIC STUDIES:  DG Chest Portable 1 View  Result Date: 05/20/2022 CLINICAL DATA:  Chronic cough. Hypertension. Diabetic. History of lung cancer EXAM: PORTABLE CHEST 1 VIEW COMPARISON:  04/10/2022 FINDINGS: Right Port-A-Cath tip at high right atrium. Median sternotomy for CABG. Patient rotated to the left. Normal heart size. Mild right hemidiaphragm elevation. No pleural effusion or pneumothorax. Improved right lower lobe aeration with residual airspace disease. Clear left lung. IMPRESSION: Improved right lower lobe airspace disease since 04/10/2022. No acute superimposed process. Electronically Signed   By: Abigail Miyamoto M.D.   On: 05/20/2022 17:52     ASSESSMENT/PLAN:  This is a very pleasant 71 year old Caucasian male diagnosed with stage IIIb (T4, N0/N2, M0) non-small cell lung cancer, squamous cell carcinoma.  He presented with a large right lower lobe lung mass in addition to satellite nodules close to the mass and suspicious mediastinal lymphadenopathy.  He was diagnosed in May 2023.   His PD-L1 expression is 1%.  The patient completed a course of concurrent chemoradiation with weekly carboplatin for an AUC of 2 and paclitaxel 45 mg per metered square.  He is status post 6 cycles.  The patient has been having fatigue and radiation-induced esophagitis.  The patient's last day radiation was on  02/08/2022.   Restaging CT scan at that time showed a positive response to treatment.  It is a decrease in size of the right lower lobe lung mass which is now cavitary.   Eventually started consolidation immunotherapy with Imfinzi 1500 mg IV daily for 4 weeks.  He status post 2  cycles.  Patient was seen with Dr. Julien Nordmann today.  Overall, the patient is continuing to endorse generalized weakness.   Patient was seen with Dr. Julien Nordmann today.  Labs were reviewed.  Recommend that he ***???  I will arrange for restaging CT scan of the chest without IV contrast due to his renal insufficiency.  Pathogen panel and C. difficile testing?  The patient was advised to call immediately if she has any concerning symptoms in the interval. The patient voices understanding of current disease status and treatment options and is in agreement with the current care plan. All questions were answered. The patient knows to call the clinic with any problems, questions or concerns. We can certainly see the patient much sooner if necessary     No orders of the defined types were placed in this encounter.    I spent {CHL ONC TIME VISIT - YZJQD:[id number]} counseling the patient face to face. The total time spent in the appointment was {CHL ONC TIME VISIT - RFVOH:[id number]}.  Kirill Chatterjee L Lyann Hagstrom, PA-C 05/21/22

## 2022-05-22 ENCOUNTER — Inpatient Hospital Stay: Payer: Medicare HMO

## 2022-05-22 ENCOUNTER — Inpatient Hospital Stay: Payer: Medicare HMO | Attending: Internal Medicine | Admitting: Physician Assistant

## 2022-05-22 ENCOUNTER — Telehealth: Payer: Self-pay

## 2022-05-22 ENCOUNTER — Other Ambulatory Visit: Payer: Medicare HMO

## 2022-05-22 VITALS — BP 111/70 | HR 90 | Temp 97.8°F | Resp 17 | Ht 70.0 in | Wt 156.8 lb

## 2022-05-22 DIAGNOSIS — C3491 Malignant neoplasm of unspecified part of right bronchus or lung: Secondary | ICD-10-CM

## 2022-05-22 DIAGNOSIS — C3431 Malignant neoplasm of lower lobe, right bronchus or lung: Secondary | ICD-10-CM | POA: Diagnosis not present

## 2022-05-22 DIAGNOSIS — R918 Other nonspecific abnormal finding of lung field: Secondary | ICD-10-CM

## 2022-05-22 DIAGNOSIS — R7989 Other specified abnormal findings of blood chemistry: Secondary | ICD-10-CM

## 2022-05-22 DIAGNOSIS — N189 Chronic kidney disease, unspecified: Secondary | ICD-10-CM | POA: Insufficient documentation

## 2022-05-22 DIAGNOSIS — R5383 Other fatigue: Secondary | ICD-10-CM | POA: Diagnosis not present

## 2022-05-22 LAB — CMP (CANCER CENTER ONLY)
ALT: 18 U/L (ref 0–44)
AST: 31 U/L (ref 15–41)
Albumin: 2.7 g/dL — ABNORMAL LOW (ref 3.5–5.0)
Alkaline Phosphatase: 39 U/L (ref 38–126)
Anion gap: 7 (ref 5–15)
BUN: 96 mg/dL — ABNORMAL HIGH (ref 8–23)
CO2: 23 mmol/L (ref 22–32)
Calcium: 8.3 mg/dL — ABNORMAL LOW (ref 8.9–10.3)
Chloride: 105 mmol/L (ref 98–111)
Creatinine: 3.06 mg/dL (ref 0.61–1.24)
GFR, Estimated: 21 mL/min — ABNORMAL LOW (ref >60)
Glucose, Bld: 97 mg/dL (ref 70–99)
Potassium: 4.8 mmol/L (ref 3.5–5.1)
Sodium: 135 mmol/L (ref 135–145)
Total Bilirubin: 0.4 mg/dL (ref 0.3–1.2)
Total Protein: 6.3 g/dL — ABNORMAL LOW (ref 6.5–8.1)

## 2022-05-22 LAB — CBC WITH DIFFERENTIAL (CANCER CENTER ONLY)
Abs Immature Granulocytes: 0.03 10*3/uL (ref 0.00–0.07)
Basophils Absolute: 0 10*3/uL (ref 0.0–0.1)
Basophils Relative: 1 %
Eosinophils Absolute: 0.7 10*3/uL — ABNORMAL HIGH (ref 0.0–0.5)
Eosinophils Relative: 10 %
HCT: 29.5 % — ABNORMAL LOW (ref 39.0–52.0)
Hemoglobin: 9.8 g/dL — ABNORMAL LOW (ref 13.0–17.0)
Immature Granulocytes: 0 %
Lymphocytes Relative: 22 %
Lymphs Abs: 1.6 10*3/uL (ref 0.7–4.0)
MCH: 29.5 pg (ref 26.0–34.0)
MCHC: 33.2 g/dL (ref 30.0–36.0)
MCV: 88.9 fL (ref 80.0–100.0)
Monocytes Absolute: 0.6 10*3/uL (ref 0.1–1.0)
Monocytes Relative: 8 %
Neutro Abs: 4.3 10*3/uL (ref 1.7–7.7)
Neutrophils Relative %: 59 %
Platelet Count: 224 10*3/uL (ref 150–400)
RBC: 3.32 MIL/uL — ABNORMAL LOW (ref 4.22–5.81)
RDW: 16.5 % — ABNORMAL HIGH (ref 11.5–15.5)
WBC Count: 7.3 10*3/uL (ref 4.0–10.5)
nRBC: 0 % (ref 0.0–0.2)

## 2022-05-22 LAB — TSH: TSH: 5.087 u[IU]/mL — ABNORMAL HIGH (ref 0.350–4.500)

## 2022-05-22 MED ORDER — SODIUM CHLORIDE 0.9% FLUSH
10.0000 mL | INTRAVENOUS | Status: AC | PRN
  Administered 2022-05-22: 10 mL

## 2022-05-22 MED ORDER — SODIUM CHLORIDE 0.9 % IV SOLN: Freq: Once | INTRAVENOUS | Status: AC

## 2022-05-22 NOTE — Telephone Encounter (Signed)
CRITICAL VALUE STICKER  CRITICAL VALUE: CREATININE 3.06  RECEIVER (on-site recipient of call): Amiya Escamilla P. LPN  DATE & TIME NOTIFIED: 05/22/22 10:32am  MESSENGER (representative from lab): Deidre Ala  MD NOTIFIED: Rubin Payor Heilingoetter, PA  TIME OF NOTIFICATION: 10:36am

## 2022-05-22 NOTE — Patient Instructions (Signed)

## 2022-05-24 ENCOUNTER — Other Ambulatory Visit: Payer: Self-pay | Admitting: Family Medicine

## 2022-05-24 LAB — T4: T4, Total: 7.3 ug/dL (ref 4.5–12.0)

## 2022-05-24 NOTE — Telephone Encounter (Signed)
Refill request Remeron Last refill 05/02/22 #30/2 Pharmacy is requesting a 90 day supply

## 2022-05-29 DIAGNOSIS — N02B9 Other recurrent and persistent immunoglobulin A nephropathy: Secondary | ICD-10-CM | POA: Diagnosis not present

## 2022-05-29 DIAGNOSIS — N390 Urinary tract infection, site not specified: Secondary | ICD-10-CM | POA: Diagnosis not present

## 2022-05-29 DIAGNOSIS — E785 Hyperlipidemia, unspecified: Secondary | ICD-10-CM | POA: Diagnosis not present

## 2022-05-29 DIAGNOSIS — C3490 Malignant neoplasm of unspecified part of unspecified bronchus or lung: Secondary | ICD-10-CM | POA: Diagnosis not present

## 2022-05-29 DIAGNOSIS — I10 Essential (primary) hypertension: Secondary | ICD-10-CM | POA: Diagnosis not present

## 2022-05-29 DIAGNOSIS — N179 Acute kidney failure, unspecified: Secondary | ICD-10-CM | POA: Diagnosis not present

## 2022-05-30 ENCOUNTER — Other Ambulatory Visit: Payer: Self-pay | Admitting: Cardiology

## 2022-05-30 DIAGNOSIS — E7800 Pure hypercholesterolemia, unspecified: Secondary | ICD-10-CM

## 2022-05-31 ENCOUNTER — Other Ambulatory Visit: Payer: Self-pay | Admitting: Physician Assistant

## 2022-05-31 DIAGNOSIS — C3491 Malignant neoplasm of unspecified part of right bronchus or lung: Secondary | ICD-10-CM

## 2022-06-03 ENCOUNTER — Telehealth: Payer: Self-pay | Admitting: Internal Medicine

## 2022-06-03 NOTE — Telephone Encounter (Signed)
Rescheduled 11/29 appointment to 12/04 due to provider pal, patient has been called and notified.

## 2022-06-04 ENCOUNTER — Telehealth: Payer: Self-pay | Admitting: Family Medicine

## 2022-06-04 DIAGNOSIS — R059 Cough, unspecified: Secondary | ICD-10-CM

## 2022-06-04 DIAGNOSIS — N179 Acute kidney failure, unspecified: Secondary | ICD-10-CM | POA: Diagnosis not present

## 2022-06-04 DIAGNOSIS — C3491 Malignant neoplasm of unspecified part of right bronchus or lung: Secondary | ICD-10-CM

## 2022-06-04 MED ORDER — HYDROCODONE BIT-HOMATROP MBR 5-1.5 MG/5ML PO SOLN: 5.0000 mL | Freq: Three times a day (TID) | ORAL | 0 refills | Status: AC | PRN

## 2022-06-04 NOTE — Telephone Encounter (Signed)
  Encourage patient to contact the pharmacy for refills or they can request refills through [facility]  Did the patient contact the pharmacy:  yes   LAST APPOINTMENT DATE:  Please schedule appointment if longer than 1 year 05/10/2022 NEXT APPOINTMENT DATE:  MEDICATION:HYDROcodone bit-homatropine (HYCODAN) 5-1.5 MG/5ML syrup   Is the patient out of medication? yes  If not, how much is left?  Is this a 90 day supply: no  PHARMACY: [id number] - [place], [place] [address] Phone: [phone]  Fax: [phone]      Let patient know to contact pharmacy at the end of the day to make sure medication is ready.  Please notify patient to allow 48-72 hours to process

## 2022-06-04 NOTE — Telephone Encounter (Signed)
LOV - 05/10/22 NOV - not scheduled RF - 05/02/22 #120/0

## 2022-06-04 NOTE — Telephone Encounter (Signed)
Sent. Thanks.   

## 2022-06-12 ENCOUNTER — Encounter: Payer: Self-pay | Admitting: Family Medicine

## 2022-06-12 ENCOUNTER — Other Ambulatory Visit: Payer: Self-pay | Admitting: Family Medicine

## 2022-06-17 ENCOUNTER — Encounter (HOSPITAL_COMMUNITY): Payer: Self-pay

## 2022-06-17 ENCOUNTER — Ambulatory Visit (HOSPITAL_COMMUNITY)
Admission: RE | Admit: 2022-06-17 | Discharge: 2022-06-17 | Disposition: A | Discharge status: Home / Self Care | Payer: Medicare HMO | Source: Ambulatory Visit | Attending: Physician Assistant | Admitting: Physician Assistant

## 2022-06-17 ENCOUNTER — Other Ambulatory Visit: Payer: Self-pay | Admitting: Physician Assistant

## 2022-06-17 DIAGNOSIS — J189 Pneumonia, unspecified organism: Secondary | ICD-10-CM | POA: Diagnosis present

## 2022-06-17 DIAGNOSIS — J440 Chronic obstructive pulmonary disease with acute lower respiratory infection: Secondary | ICD-10-CM | POA: Diagnosis present

## 2022-06-17 DIAGNOSIS — E039 Hypothyroidism, unspecified: Secondary | ICD-10-CM | POA: Diagnosis present

## 2022-06-17 DIAGNOSIS — I82451 Acute embolism and thrombosis of right peroneal vein: Secondary | ICD-10-CM | POA: Diagnosis present

## 2022-06-17 DIAGNOSIS — N179 Acute kidney failure, unspecified: Secondary | ICD-10-CM | POA: Diagnosis not present

## 2022-06-17 DIAGNOSIS — I5032 Chronic diastolic (congestive) heart failure: Secondary | ICD-10-CM | POA: Diagnosis not present

## 2022-06-17 DIAGNOSIS — J069 Acute upper respiratory infection, unspecified: Secondary | ICD-10-CM

## 2022-06-17 DIAGNOSIS — E785 Hyperlipidemia, unspecified: Secondary | ICD-10-CM | POA: Diagnosis not present

## 2022-06-17 DIAGNOSIS — N189 Chronic kidney disease, unspecified: Secondary | ICD-10-CM | POA: Diagnosis not present

## 2022-06-17 DIAGNOSIS — G992 Myelopathy in diseases classified elsewhere: Secondary | ICD-10-CM | POA: Diagnosis present

## 2022-06-17 DIAGNOSIS — R451 Restlessness and agitation: Secondary | ICD-10-CM | POA: Diagnosis not present

## 2022-06-17 DIAGNOSIS — B59 Pneumocystosis: Secondary | ICD-10-CM | POA: Diagnosis present

## 2022-06-17 DIAGNOSIS — Z1152 Encounter for screening for COVID-19: Secondary | ICD-10-CM | POA: Diagnosis not present

## 2022-06-17 DIAGNOSIS — E669 Obesity, unspecified: Secondary | ICD-10-CM | POA: Diagnosis present

## 2022-06-17 DIAGNOSIS — R079 Chest pain, unspecified: Secondary | ICD-10-CM | POA: Diagnosis not present

## 2022-06-17 DIAGNOSIS — E1159 Type 2 diabetes mellitus with other circulatory complications: Secondary | ICD-10-CM | POA: Diagnosis not present

## 2022-06-17 DIAGNOSIS — Z79899 Other long term (current) drug therapy: Secondary | ICD-10-CM | POA: Diagnosis not present

## 2022-06-17 DIAGNOSIS — M7989 Other specified soft tissue disorders: Secondary | ICD-10-CM | POA: Diagnosis not present

## 2022-06-17 DIAGNOSIS — C3431 Malignant neoplasm of lower lobe, right bronchus or lung: Secondary | ICD-10-CM | POA: Diagnosis present

## 2022-06-17 DIAGNOSIS — R7989 Other specified abnormal findings of blood chemistry: Secondary | ICD-10-CM | POA: Diagnosis not present

## 2022-06-17 DIAGNOSIS — J439 Emphysema, unspecified: Secondary | ICD-10-CM | POA: Diagnosis not present

## 2022-06-17 DIAGNOSIS — Z515 Encounter for palliative care: Secondary | ICD-10-CM | POA: Diagnosis not present

## 2022-06-17 DIAGNOSIS — J9690 Respiratory failure, unspecified, unspecified whether with hypoxia or hypercapnia: Secondary | ICD-10-CM | POA: Diagnosis not present

## 2022-06-17 DIAGNOSIS — I82432 Acute embolism and thrombosis of left popliteal vein: Secondary | ICD-10-CM | POA: Diagnosis present

## 2022-06-17 DIAGNOSIS — D649 Anemia, unspecified: Secondary | ICD-10-CM | POA: Diagnosis not present

## 2022-06-17 DIAGNOSIS — R748 Abnormal levels of other serum enzymes: Secondary | ICD-10-CM | POA: Diagnosis not present

## 2022-06-17 DIAGNOSIS — R52 Pain, unspecified: Secondary | ICD-10-CM | POA: Diagnosis not present

## 2022-06-17 DIAGNOSIS — I2510 Atherosclerotic heart disease of native coronary artery without angina pectoris: Secondary | ICD-10-CM | POA: Diagnosis not present

## 2022-06-17 DIAGNOSIS — I129 Hypertensive chronic kidney disease with stage 1 through stage 4 chronic kidney disease, or unspecified chronic kidney disease: Secondary | ICD-10-CM | POA: Diagnosis not present

## 2022-06-17 DIAGNOSIS — E7989 Other specified disorders of purine and pyrimidine metabolism: Secondary | ICD-10-CM | POA: Diagnosis present

## 2022-06-17 DIAGNOSIS — N170 Acute kidney failure with tubular necrosis: Secondary | ICD-10-CM | POA: Diagnosis present

## 2022-06-17 DIAGNOSIS — R0600 Dyspnea, unspecified: Secondary | ICD-10-CM | POA: Diagnosis not present

## 2022-06-17 DIAGNOSIS — Z7189 Other specified counseling: Secondary | ICD-10-CM | POA: Diagnosis not present

## 2022-06-17 DIAGNOSIS — E876 Hypokalemia: Secondary | ICD-10-CM | POA: Diagnosis not present

## 2022-06-17 DIAGNOSIS — E43 Unspecified severe protein-calorie malnutrition: Secondary | ICD-10-CM | POA: Diagnosis present

## 2022-06-17 DIAGNOSIS — Z4682 Encounter for fitting and adjustment of non-vascular catheter: Secondary | ICD-10-CM | POA: Diagnosis not present

## 2022-06-17 DIAGNOSIS — R188 Other ascites: Secondary | ICD-10-CM | POA: Diagnosis present

## 2022-06-17 DIAGNOSIS — R0902 Hypoxemia: Secondary | ICD-10-CM | POA: Diagnosis not present

## 2022-06-17 DIAGNOSIS — R4589 Other symptoms and signs involving emotional state: Secondary | ICD-10-CM | POA: Diagnosis not present

## 2022-06-17 DIAGNOSIS — I739 Peripheral vascular disease, unspecified: Secondary | ICD-10-CM | POA: Diagnosis not present

## 2022-06-17 DIAGNOSIS — N184 Chronic kidney disease, stage 4 (severe): Secondary | ICD-10-CM | POA: Diagnosis present

## 2022-06-17 DIAGNOSIS — C3491 Malignant neoplasm of unspecified part of right bronchus or lung: Secondary | ICD-10-CM | POA: Diagnosis not present

## 2022-06-17 DIAGNOSIS — E1122 Type 2 diabetes mellitus with diabetic chronic kidney disease: Secondary | ICD-10-CM | POA: Diagnosis present

## 2022-06-17 DIAGNOSIS — R9431 Abnormal electrocardiogram [ECG] [EKG]: Secondary | ICD-10-CM | POA: Diagnosis not present

## 2022-06-17 DIAGNOSIS — R918 Other nonspecific abnormal finding of lung field: Secondary | ICD-10-CM | POA: Insufficient documentation

## 2022-06-17 DIAGNOSIS — R0602 Shortness of breath: Secondary | ICD-10-CM | POA: Diagnosis not present

## 2022-06-17 DIAGNOSIS — I5033 Acute on chronic diastolic (congestive) heart failure: Secondary | ICD-10-CM | POA: Diagnosis present

## 2022-06-17 DIAGNOSIS — J80 Acute respiratory distress syndrome: Secondary | ICD-10-CM | POA: Diagnosis present

## 2022-06-17 DIAGNOSIS — R531 Weakness: Secondary | ICD-10-CM | POA: Diagnosis not present

## 2022-06-17 DIAGNOSIS — D849 Immunodeficiency, unspecified: Secondary | ICD-10-CM | POA: Diagnosis present

## 2022-06-17 DIAGNOSIS — I70203 Unspecified atherosclerosis of native arteries of extremities, bilateral legs: Secondary | ICD-10-CM | POA: Diagnosis present

## 2022-06-17 DIAGNOSIS — C3490 Malignant neoplasm of unspecified part of unspecified bronchus or lung: Secondary | ICD-10-CM | POA: Diagnosis not present

## 2022-06-17 DIAGNOSIS — J90 Pleural effusion, not elsewhere classified: Secondary | ICD-10-CM | POA: Diagnosis not present

## 2022-06-17 DIAGNOSIS — Z66 Do not resuscitate: Secondary | ICD-10-CM | POA: Diagnosis present

## 2022-06-17 DIAGNOSIS — I21A1 Myocardial infarction type 2: Secondary | ICD-10-CM | POA: Diagnosis present

## 2022-06-17 DIAGNOSIS — J9601 Acute respiratory failure with hypoxia: Secondary | ICD-10-CM | POA: Diagnosis not present

## 2022-06-17 DIAGNOSIS — D630 Anemia in neoplastic disease: Secondary | ICD-10-CM | POA: Diagnosis present

## 2022-06-17 DIAGNOSIS — J479 Bronchiectasis, uncomplicated: Secondary | ICD-10-CM | POA: Diagnosis not present

## 2022-06-17 DIAGNOSIS — I152 Hypertension secondary to endocrine disorders: Secondary | ICD-10-CM | POA: Diagnosis present

## 2022-06-17 MED ORDER — DOXYCYCLINE HYCLATE 100 MG PO TABS: 100.0000 mg | ORAL_TABLET | Freq: Two times a day (BID) | ORAL | 0 refills | Status: AC

## 2022-06-17 NOTE — Progress Notes (Signed)
I called the patient after receiving his scan results. His scan showed possible infection/inflammation. He reports cough and shortness of breath. No fevers. He sounded well on the phone. I let him know I am sending an antibiotic to the pharmacy. We will see him as scheduled next week to review the remainder of the scan, which did show interval response to treatment. In the meantime, he will take antibiotics. He expressed understanding with the instructions.

## 2022-06-18 ENCOUNTER — Inpatient Hospital Stay (HOSPITAL_COMMUNITY)
Admission: EM | Admit: 2022-06-18 | Discharge: 2022-07-22 | DRG: 208 | Disposition: E | Payer: Medicare HMO | Attending: Student | Admitting: Student

## 2022-06-18 ENCOUNTER — Other Ambulatory Visit: Payer: Self-pay

## 2022-06-18 ENCOUNTER — Telehealth: Payer: Self-pay | Admitting: Medical Oncology

## 2022-06-18 ENCOUNTER — Encounter (HOSPITAL_COMMUNITY): Payer: Self-pay

## 2022-06-18 ENCOUNTER — Emergency Department (HOSPITAL_COMMUNITY): Payer: Medicare HMO

## 2022-06-18 ENCOUNTER — Telehealth: Payer: Self-pay | Admitting: Family Medicine

## 2022-06-18 DIAGNOSIS — R531 Weakness: Secondary | ICD-10-CM | POA: Diagnosis not present

## 2022-06-18 DIAGNOSIS — C3490 Malignant neoplasm of unspecified part of unspecified bronchus or lung: Secondary | ICD-10-CM | POA: Diagnosis not present

## 2022-06-18 DIAGNOSIS — N058 Unspecified nephritic syndrome with other morphologic changes: Secondary | ICD-10-CM | POA: Diagnosis present

## 2022-06-18 DIAGNOSIS — Z6822 Body mass index (BMI) 22.0-22.9, adult: Secondary | ICD-10-CM

## 2022-06-18 DIAGNOSIS — M7989 Other specified soft tissue disorders: Secondary | ICD-10-CM | POA: Diagnosis not present

## 2022-06-18 DIAGNOSIS — I152 Hypertension secondary to endocrine disorders: Secondary | ICD-10-CM | POA: Diagnosis present

## 2022-06-18 DIAGNOSIS — J90 Pleural effusion, not elsewhere classified: Secondary | ICD-10-CM | POA: Diagnosis not present

## 2022-06-18 DIAGNOSIS — T4275XA Adverse effect of unspecified antiepileptic and sedative-hypnotic drugs, initial encounter: Secondary | ICD-10-CM | POA: Diagnosis not present

## 2022-06-18 DIAGNOSIS — R748 Abnormal levels of other serum enzymes: Secondary | ICD-10-CM | POA: Diagnosis not present

## 2022-06-18 DIAGNOSIS — R0600 Dyspnea, unspecified: Secondary | ICD-10-CM | POA: Diagnosis not present

## 2022-06-18 DIAGNOSIS — R590 Localized enlarged lymph nodes: Secondary | ICD-10-CM | POA: Diagnosis present

## 2022-06-18 DIAGNOSIS — Z9911 Dependence on respirator [ventilator] status: Secondary | ICD-10-CM

## 2022-06-18 DIAGNOSIS — Z888 Allergy status to other drugs, medicaments and biological substances status: Secondary | ICD-10-CM

## 2022-06-18 DIAGNOSIS — I5032 Chronic diastolic (congestive) heart failure: Secondary | ICD-10-CM | POA: Diagnosis not present

## 2022-06-18 DIAGNOSIS — R52 Pain, unspecified: Secondary | ICD-10-CM | POA: Diagnosis not present

## 2022-06-18 DIAGNOSIS — R188 Other ascites: Secondary | ICD-10-CM | POA: Diagnosis present

## 2022-06-18 DIAGNOSIS — E039 Hypothyroidism, unspecified: Secondary | ICD-10-CM | POA: Diagnosis present

## 2022-06-18 DIAGNOSIS — I82451 Acute embolism and thrombosis of right peroneal vein: Secondary | ICD-10-CM | POA: Diagnosis present

## 2022-06-18 DIAGNOSIS — Z8249 Family history of ischemic heart disease and other diseases of the circulatory system: Secondary | ICD-10-CM

## 2022-06-18 DIAGNOSIS — Z87891 Personal history of nicotine dependence: Secondary | ICD-10-CM

## 2022-06-18 DIAGNOSIS — R9431 Abnormal electrocardiogram [ECG] [EKG]: Secondary | ICD-10-CM | POA: Diagnosis not present

## 2022-06-18 DIAGNOSIS — M1990 Unspecified osteoarthritis, unspecified site: Secondary | ICD-10-CM | POA: Diagnosis present

## 2022-06-18 DIAGNOSIS — E876 Hypokalemia: Secondary | ICD-10-CM | POA: Diagnosis present

## 2022-06-18 DIAGNOSIS — R451 Restlessness and agitation: Secondary | ICD-10-CM

## 2022-06-18 DIAGNOSIS — I2510 Atherosclerotic heart disease of native coronary artery without angina pectoris: Secondary | ICD-10-CM | POA: Diagnosis present

## 2022-06-18 DIAGNOSIS — N179 Acute kidney failure, unspecified: Secondary | ICD-10-CM | POA: Diagnosis not present

## 2022-06-18 DIAGNOSIS — Z833 Family history of diabetes mellitus: Secondary | ICD-10-CM

## 2022-06-18 DIAGNOSIS — I70203 Unspecified atherosclerosis of native arteries of extremities, bilateral legs: Secondary | ICD-10-CM | POA: Diagnosis present

## 2022-06-18 DIAGNOSIS — J8410 Pulmonary fibrosis, unspecified: Secondary | ICD-10-CM | POA: Diagnosis present

## 2022-06-18 DIAGNOSIS — D849 Immunodeficiency, unspecified: Secondary | ICD-10-CM | POA: Diagnosis present

## 2022-06-18 DIAGNOSIS — I252 Old myocardial infarction: Secondary | ICD-10-CM

## 2022-06-18 DIAGNOSIS — R7989 Other specified abnormal findings of blood chemistry: Secondary | ICD-10-CM | POA: Diagnosis present

## 2022-06-18 DIAGNOSIS — E1169 Type 2 diabetes mellitus with other specified complication: Secondary | ICD-10-CM | POA: Diagnosis present

## 2022-06-18 DIAGNOSIS — R1310 Dysphagia, unspecified: Secondary | ICD-10-CM | POA: Diagnosis present

## 2022-06-18 DIAGNOSIS — B59 Pneumocystosis: Principal | ICD-10-CM | POA: Diagnosis present

## 2022-06-18 DIAGNOSIS — Z79890 Hormone replacement therapy (postmenopausal): Secondary | ICD-10-CM

## 2022-06-18 DIAGNOSIS — E7989 Other specified disorders of purine and pyrimidine metabolism: Secondary | ICD-10-CM | POA: Diagnosis present

## 2022-06-18 DIAGNOSIS — N184 Chronic kidney disease, stage 4 (severe): Secondary | ICD-10-CM | POA: Diagnosis present

## 2022-06-18 DIAGNOSIS — E1122 Type 2 diabetes mellitus with diabetic chronic kidney disease: Secondary | ICD-10-CM | POA: Diagnosis present

## 2022-06-18 DIAGNOSIS — Z515 Encounter for palliative care: Secondary | ICD-10-CM | POA: Diagnosis not present

## 2022-06-18 DIAGNOSIS — E1159 Type 2 diabetes mellitus with other circulatory complications: Secondary | ICD-10-CM | POA: Diagnosis not present

## 2022-06-18 DIAGNOSIS — Z87440 Personal history of urinary (tract) infections: Secondary | ICD-10-CM

## 2022-06-18 DIAGNOSIS — C3491 Malignant neoplasm of unspecified part of right bronchus or lung: Secondary | ICD-10-CM | POA: Diagnosis present

## 2022-06-18 DIAGNOSIS — R0902 Hypoxemia: Secondary | ICD-10-CM | POA: Diagnosis not present

## 2022-06-18 DIAGNOSIS — R0602 Shortness of breath: Secondary | ICD-10-CM

## 2022-06-18 DIAGNOSIS — J9601 Acute respiratory failure with hypoxia: Secondary | ICD-10-CM | POA: Diagnosis present

## 2022-06-18 DIAGNOSIS — Z79899 Other long term (current) drug therapy: Secondary | ICD-10-CM

## 2022-06-18 DIAGNOSIS — D649 Anemia, unspecified: Secondary | ICD-10-CM | POA: Diagnosis not present

## 2022-06-18 DIAGNOSIS — G992 Myelopathy in diseases classified elsewhere: Secondary | ICD-10-CM | POA: Diagnosis present

## 2022-06-18 DIAGNOSIS — E1151 Type 2 diabetes mellitus with diabetic peripheral angiopathy without gangrene: Secondary | ICD-10-CM | POA: Diagnosis present

## 2022-06-18 DIAGNOSIS — R627 Adult failure to thrive: Secondary | ICD-10-CM | POA: Diagnosis present

## 2022-06-18 DIAGNOSIS — B9620 Unspecified Escherichia coli [E. coli] as the cause of diseases classified elsewhere: Secondary | ICD-10-CM | POA: Diagnosis present

## 2022-06-18 DIAGNOSIS — I82432 Acute embolism and thrombosis of left popliteal vein: Secondary | ICD-10-CM | POA: Diagnosis present

## 2022-06-18 DIAGNOSIS — Z7189 Other specified counseling: Secondary | ICD-10-CM | POA: Diagnosis not present

## 2022-06-18 DIAGNOSIS — I5033 Acute on chronic diastolic (congestive) heart failure: Secondary | ICD-10-CM | POA: Diagnosis present

## 2022-06-18 DIAGNOSIS — Z951 Presence of aortocoronary bypass graft: Secondary | ICD-10-CM

## 2022-06-18 DIAGNOSIS — C3431 Malignant neoplasm of lower lobe, right bronchus or lung: Secondary | ICD-10-CM | POA: Diagnosis present

## 2022-06-18 DIAGNOSIS — I493 Ventricular premature depolarization: Secondary | ICD-10-CM | POA: Diagnosis present

## 2022-06-18 DIAGNOSIS — R339 Retention of urine, unspecified: Secondary | ICD-10-CM | POA: Diagnosis not present

## 2022-06-18 DIAGNOSIS — N170 Acute kidney failure with tubular necrosis: Secondary | ICD-10-CM | POA: Diagnosis present

## 2022-06-18 DIAGNOSIS — R4589 Other symptoms and signs involving emotional state: Secondary | ICD-10-CM | POA: Diagnosis not present

## 2022-06-18 DIAGNOSIS — J440 Chronic obstructive pulmonary disease with acute lower respiratory infection: Secondary | ICD-10-CM | POA: Diagnosis present

## 2022-06-18 DIAGNOSIS — R918 Other nonspecific abnormal finding of lung field: Secondary | ICD-10-CM | POA: Diagnosis not present

## 2022-06-18 DIAGNOSIS — E782 Mixed hyperlipidemia: Secondary | ICD-10-CM | POA: Diagnosis present

## 2022-06-18 DIAGNOSIS — Z825 Family history of asthma and other chronic lower respiratory diseases: Secondary | ICD-10-CM

## 2022-06-18 DIAGNOSIS — M109 Gout, unspecified: Secondary | ICD-10-CM | POA: Diagnosis present

## 2022-06-18 DIAGNOSIS — Z981 Arthrodesis status: Secondary | ICD-10-CM

## 2022-06-18 DIAGNOSIS — Z1152 Encounter for screening for COVID-19: Secondary | ICD-10-CM | POA: Diagnosis not present

## 2022-06-18 DIAGNOSIS — J9690 Respiratory failure, unspecified, unspecified whether with hypoxia or hypercapnia: Secondary | ICD-10-CM | POA: Diagnosis not present

## 2022-06-18 DIAGNOSIS — I952 Hypotension due to drugs: Secondary | ICD-10-CM | POA: Diagnosis not present

## 2022-06-18 DIAGNOSIS — Z1612 Extended spectrum beta lactamase (ESBL) resistance: Secondary | ICD-10-CM | POA: Diagnosis present

## 2022-06-18 DIAGNOSIS — E785 Hyperlipidemia, unspecified: Secondary | ICD-10-CM | POA: Diagnosis not present

## 2022-06-18 DIAGNOSIS — E8809 Other disorders of plasma-protein metabolism, not elsewhere classified: Secondary | ICD-10-CM | POA: Diagnosis present

## 2022-06-18 DIAGNOSIS — N390 Urinary tract infection, site not specified: Secondary | ICD-10-CM | POA: Diagnosis present

## 2022-06-18 DIAGNOSIS — Z66 Do not resuscitate: Secondary | ICD-10-CM | POA: Diagnosis present

## 2022-06-18 DIAGNOSIS — Z9221 Personal history of antineoplastic chemotherapy: Secondary | ICD-10-CM

## 2022-06-18 DIAGNOSIS — Z955 Presence of coronary angioplasty implant and graft: Secondary | ICD-10-CM

## 2022-06-18 DIAGNOSIS — E669 Obesity, unspecified: Secondary | ICD-10-CM | POA: Diagnosis present

## 2022-06-18 DIAGNOSIS — Z4682 Encounter for fitting and adjustment of non-vascular catheter: Secondary | ICD-10-CM | POA: Diagnosis not present

## 2022-06-18 DIAGNOSIS — E43 Unspecified severe protein-calorie malnutrition: Secondary | ICD-10-CM | POA: Diagnosis present

## 2022-06-18 DIAGNOSIS — Z923 Personal history of irradiation: Secondary | ICD-10-CM

## 2022-06-18 DIAGNOSIS — E875 Hyperkalemia: Secondary | ICD-10-CM | POA: Diagnosis not present

## 2022-06-18 DIAGNOSIS — J189 Pneumonia, unspecified organism: Secondary | ICD-10-CM | POA: Diagnosis present

## 2022-06-18 DIAGNOSIS — R197 Diarrhea, unspecified: Secondary | ICD-10-CM | POA: Diagnosis present

## 2022-06-18 DIAGNOSIS — Z7982 Long term (current) use of aspirin: Secondary | ICD-10-CM

## 2022-06-18 DIAGNOSIS — R253 Fasciculation: Secondary | ICD-10-CM | POA: Diagnosis not present

## 2022-06-18 DIAGNOSIS — H409 Unspecified glaucoma: Secondary | ICD-10-CM | POA: Diagnosis present

## 2022-06-18 DIAGNOSIS — I21A1 Myocardial infarction type 2: Secondary | ICD-10-CM | POA: Diagnosis present

## 2022-06-18 DIAGNOSIS — F419 Anxiety disorder, unspecified: Secondary | ICD-10-CM | POA: Diagnosis present

## 2022-06-18 DIAGNOSIS — I739 Peripheral vascular disease, unspecified: Secondary | ICD-10-CM | POA: Diagnosis present

## 2022-06-18 DIAGNOSIS — J80 Acute respiratory distress syndrome: Secondary | ICD-10-CM | POA: Diagnosis present

## 2022-06-18 DIAGNOSIS — E872 Acidosis, unspecified: Secondary | ICD-10-CM | POA: Diagnosis not present

## 2022-06-18 DIAGNOSIS — D630 Anemia in neoplastic disease: Secondary | ICD-10-CM | POA: Diagnosis present

## 2022-06-18 DIAGNOSIS — M4802 Spinal stenosis, cervical region: Secondary | ICD-10-CM | POA: Diagnosis present

## 2022-06-18 DIAGNOSIS — Z809 Family history of malignant neoplasm, unspecified: Secondary | ICD-10-CM

## 2022-06-18 LAB — URINALYSIS, COMPLETE (UACMP) WITH MICROSCOPIC
Bilirubin Urine: NEGATIVE
Glucose, UA: NEGATIVE mg/dL
Ketones, ur: NEGATIVE mg/dL
Nitrite: NEGATIVE
Protein, ur: >=300 mg/dL — AB
RBC / HPF: >50 RBC/hpf — ABNORMAL HIGH (ref 0–5)
Specific Gravity, Urine: 1.014 (ref 1.005–1.030)
WBC, UA: >50 WBC/hpf — ABNORMAL HIGH (ref 0–5)
pH: 5 (ref 5.0–8.0)

## 2022-06-18 LAB — COMPREHENSIVE METABOLIC PANEL
ALT: 21 U/L (ref 0–44)
AST: 43 U/L — ABNORMAL HIGH (ref 15–41)
Albumin: 2 g/dL — ABNORMAL LOW (ref 3.5–5.0)
Alkaline Phosphatase: 52 U/L (ref 38–126)
Anion gap: 9 (ref 5–15)
BUN: 57 mg/dL — ABNORMAL HIGH (ref 8–23)
CO2: 23 mmol/L (ref 22–32)
Calcium: 7.5 mg/dL — ABNORMAL LOW (ref 8.9–10.3)
Chloride: 100 mmol/L (ref 98–111)
Creatinine, Ser: 2.42 mg/dL — ABNORMAL HIGH (ref 0.61–1.24)
GFR, Estimated: 28 mL/min — ABNORMAL LOW (ref >60)
Glucose, Bld: 201 mg/dL — ABNORMAL HIGH (ref 70–99)
Potassium: 3.1 mmol/L — ABNORMAL LOW (ref 3.5–5.1)
Sodium: 132 mmol/L — ABNORMAL LOW (ref 135–145)
Total Bilirubin: 0.8 mg/dL (ref 0.3–1.2)
Total Protein: 5.5 g/dL — ABNORMAL LOW (ref 6.5–8.1)

## 2022-06-18 LAB — BLOOD GAS, VENOUS
Acid-Base Excess: 0.7 mmol/L (ref 0.0–2.0)
Acid-Base Excess: 0.5 mmol/L (ref 0.0–2.0)
Bicarbonate: 25.4 mmol/L (ref 20.0–28.0)
Bicarbonate: 25.4 mmol/L (ref 20.0–28.0)
O2 Saturation: 65.1 %
O2 Saturation: 65.8 %
Patient temperature: 37
Patient temperature: 37
pCO2, Ven: 40 mmHg — ABNORMAL LOW (ref 44–60)
pCO2, Ven: 41 mmHg — ABNORMAL LOW (ref 44–60)
pH, Ven: 7.41 (ref 7.25–7.43)
pH, Ven: 7.4 (ref 7.25–7.43)
pO2, Ven: 36 mmHg (ref 32–45)
pO2, Ven: 36 mmHg (ref 32–45)

## 2022-06-18 LAB — PROTIME-INR
INR: 1.2 (ref 0.8–1.2)
Prothrombin Time: 15 seconds (ref 11.4–15.2)

## 2022-06-18 LAB — IRON AND TIBC
Iron: 15 ug/dL — ABNORMAL LOW (ref 45–182)
Saturation Ratios: 8 % — ABNORMAL LOW (ref 17.9–39.5)
TIBC: 178 ug/dL — ABNORMAL LOW (ref 250–450)
UIBC: 163 ug/dL

## 2022-06-18 LAB — CBC WITH DIFFERENTIAL/PLATELET
Abs Immature Granulocytes: 0.02 10*3/uL (ref 0.00–0.07)
Basophils Absolute: 0 10*3/uL (ref 0.0–0.1)
Basophils Relative: 0 %
Eosinophils Absolute: 0 10*3/uL (ref 0.0–0.5)
Eosinophils Relative: 0 %
HCT: 25.9 % — ABNORMAL LOW (ref 39.0–52.0)
Hemoglobin: 8.6 g/dL — ABNORMAL LOW (ref 13.0–17.0)
Immature Granulocytes: 0 %
Lymphocytes Relative: 15 %
Lymphs Abs: 0.7 10*3/uL (ref 0.7–4.0)
MCH: 29.8 pg (ref 26.0–34.0)
MCHC: 33.2 g/dL (ref 30.0–36.0)
MCV: 89.6 fL (ref 80.0–100.0)
Monocytes Absolute: 0.1 10*3/uL (ref 0.1–1.0)
Monocytes Relative: 2 %
Neutro Abs: 3.7 10*3/uL (ref 1.7–7.7)
Neutrophils Relative %: 83 %
Platelets: 190 10*3/uL (ref 150–400)
RBC: 2.89 MIL/uL — ABNORMAL LOW (ref 4.22–5.81)
RDW: 16.8 % — ABNORMAL HIGH (ref 11.5–15.5)
WBC: 4.5 10*3/uL (ref 4.0–10.5)
nRBC: 0 % (ref 0.0–0.2)

## 2022-06-18 LAB — CBG MONITORING, ED
Glucose-Capillary: 126 mg/dL — ABNORMAL HIGH (ref 70–99)
Glucose-Capillary: 184 mg/dL — ABNORMAL HIGH (ref 70–99)

## 2022-06-18 LAB — PHOSPHORUS: Phosphorus: 4.3 mg/dL (ref 2.5–4.6)

## 2022-06-18 LAB — FOLATE: Folate: 32.9 ng/mL (ref >5.9)

## 2022-06-18 LAB — CK: Total CK: 63 U/L (ref 49–397)

## 2022-06-18 LAB — RETICULOCYTES
Immature Retic Fract: 11.8 % (ref 2.3–15.9)
RBC.: 2.71 MIL/uL — ABNORMAL LOW (ref 4.22–5.81)
Retic Count, Absolute: 43.4 10*3/uL (ref 19.0–186.0)
Retic Ct Pct: 1.6 % (ref 0.4–3.1)

## 2022-06-18 LAB — MAGNESIUM
Magnesium: 1.5 mg/dL — ABNORMAL LOW (ref 1.7–2.4)
Magnesium: 1.3 mg/dL — ABNORMAL LOW (ref 1.7–2.4)

## 2022-06-18 LAB — FERRITIN: Ferritin: 2780 ng/mL — ABNORMAL HIGH (ref 24–336)

## 2022-06-18 LAB — TROPONIN I (HIGH SENSITIVITY): Troponin I (High Sensitivity): 2602 ng/L (ref <18)

## 2022-06-18 LAB — PROCALCITONIN: Procalcitonin: 0.73 ng/mL

## 2022-06-18 LAB — RESP PANEL BY RT-PCR (FLU A&B, COVID) ARPGX2
Influenza A by PCR: NEGATIVE
Influenza B by PCR: NEGATIVE
SARS Coronavirus 2 by RT PCR: NEGATIVE

## 2022-06-18 LAB — LACTIC ACID, PLASMA: Lactic Acid, Venous: 0.9 mmol/L (ref 0.5–1.9)

## 2022-06-18 LAB — VITAMIN B12: Vitamin B-12: 607 pg/mL (ref 180–914)

## 2022-06-18 LAB — TSH: TSH: 1.56 u[IU]/mL (ref 0.350–4.500)

## 2022-06-18 LAB — BRAIN NATRIURETIC PEPTIDE: B Natriuretic Peptide: 544.9 pg/mL — ABNORMAL HIGH (ref 0.0–100.0)

## 2022-06-18 MED ORDER — SODIUM CHLORIDE 0.9 % IV SOLN
2.0000 g | INTRAVENOUS | Status: DC
  Administered 2022-06-18 – 2022-06-21 (×4): 2 g via INTRAVENOUS
  Filled 2022-06-18 (×4): qty 12.5

## 2022-06-18 MED ORDER — LACTATED RINGERS IV BOLUS
500.0000 mL | Freq: Once | INTRAVENOUS | Status: AC
  Administered 2022-06-18: 500 mL via INTRAVENOUS

## 2022-06-18 MED ORDER — ALBUTEROL SULFATE (2.5 MG/3ML) 0.083% IN NEBU: 3.0000 mL | INHALATION_SOLUTION | Freq: Four times a day (QID) | RESPIRATORY_TRACT | Status: DC | PRN

## 2022-06-18 MED ORDER — INSULIN ASPART 100 UNIT/ML IJ SOLN
0.0000 [IU] | INTRAMUSCULAR | Status: DC
  Administered 2022-06-18: 2 [IU] via SUBCUTANEOUS
  Administered 2022-06-19: 1 [IU] via SUBCUTANEOUS
  Administered 2022-06-19: 7 [IU] via SUBCUTANEOUS
  Administered 2022-06-20: 2 [IU] via SUBCUTANEOUS
  Administered 2022-06-20: 5 [IU] via SUBCUTANEOUS
  Administered 2022-06-20: 3 [IU] via SUBCUTANEOUS
  Administered 2022-06-20: 2 [IU] via SUBCUTANEOUS
  Administered 2022-06-20: 3 [IU] via SUBCUTANEOUS
  Administered 2022-06-21: 1 [IU] via SUBCUTANEOUS
  Administered 2022-06-21: 3 [IU] via SUBCUTANEOUS
  Administered 2022-06-21: 5 [IU] via SUBCUTANEOUS
  Administered 2022-06-22: 1 [IU] via SUBCUTANEOUS
  Administered 2022-06-22: 2 [IU] via SUBCUTANEOUS
  Administered 2022-06-22: 1 [IU] via SUBCUTANEOUS
  Administered 2022-06-22 (×2): 2 [IU] via SUBCUTANEOUS
  Administered 2022-06-22: 3 [IU] via SUBCUTANEOUS
  Administered 2022-06-23: 2 [IU] via SUBCUTANEOUS
  Administered 2022-06-23 (×3): 1 [IU] via SUBCUTANEOUS
  Administered 2022-06-24: 2 [IU] via SUBCUTANEOUS
  Administered 2022-06-24: 3 [IU] via SUBCUTANEOUS
  Administered 2022-06-25: 2 [IU] via SUBCUTANEOUS
  Administered 2022-06-25: 3 [IU] via SUBCUTANEOUS
  Administered 2022-06-25 (×2): 2 [IU] via SUBCUTANEOUS
  Administered 2022-06-25: 3 [IU] via SUBCUTANEOUS
  Administered 2022-06-25: 1 [IU] via SUBCUTANEOUS
  Administered 2022-06-26 (×2): 2 [IU] via SUBCUTANEOUS
  Administered 2022-06-26: 3 [IU] via SUBCUTANEOUS
  Filled 2022-06-18: qty 0.09

## 2022-06-18 MED ORDER — DOXYCYCLINE HYCLATE 100 MG PO TABS
100.0000 mg | ORAL_TABLET | Freq: Once | ORAL | Status: AC
  Administered 2022-06-18: 100 mg via ORAL
  Filled 2022-06-18: qty 1

## 2022-06-18 MED ORDER — POTASSIUM CHLORIDE CRYS ER 20 MEQ PO TBCR: 40.0000 meq | EXTENDED_RELEASE_TABLET | Freq: Once | ORAL | Status: DC

## 2022-06-18 MED ORDER — HYDROCODONE-ACETAMINOPHEN 5-325 MG PO TABS: 1.0000 | ORAL_TABLET | ORAL | Status: DC | PRN

## 2022-06-18 MED ORDER — SODIUM CHLORIDE 0.9 % IV SOLN
1.0000 g | Freq: Once | INTRAVENOUS | Status: AC
  Administered 2022-06-18: 1 g via INTRAVENOUS
  Filled 2022-06-18: qty 10

## 2022-06-18 MED ORDER — CALCIUM GLUCONATE-NACL 1-0.675 GM/50ML-% IV SOLN
1.0000 g | Freq: Once | INTRAVENOUS | Status: AC
  Administered 2022-06-18: 1000 mg via INTRAVENOUS
  Filled 2022-06-18: qty 50

## 2022-06-18 MED ORDER — SODIUM CHLORIDE 0.9 % IV SOLN: INTRAVENOUS | Status: AC

## 2022-06-18 MED ORDER — VANCOMYCIN HCL 1250 MG/250ML IV SOLN
1250.0000 mg | Freq: Once | INTRAVENOUS | Status: AC
  Administered 2022-06-19: 1250 mg via INTRAVENOUS
  Filled 2022-06-18: qty 250

## 2022-06-18 MED ORDER — SODIUM CHLORIDE 0.9 % IV BOLUS
500.0000 mL | Freq: Once | INTRAVENOUS | Status: AC
  Administered 2022-06-18: 500 mL via INTRAVENOUS

## 2022-06-18 MED ORDER — HEPARIN (PORCINE) 25000 UT/250ML-% IV SOLN
1000.0000 [IU]/h | INTRAVENOUS | Status: DC
  Administered 2022-06-18: 850 [IU]/h via INTRAVENOUS
  Filled 2022-06-18 (×2): qty 250

## 2022-06-18 MED ORDER — ACETAMINOPHEN 325 MG PO TABS
650.0000 mg | ORAL_TABLET | Freq: Four times a day (QID) | ORAL | Status: DC | PRN
  Administered 2022-06-20: 650 mg via ORAL
  Filled 2022-06-18: qty 2

## 2022-06-18 MED ORDER — HEPARIN BOLUS VIA INFUSION
4000.0000 [IU] | Freq: Once | INTRAVENOUS | Status: AC
  Administered 2022-06-18: 4000 [IU] via INTRAVENOUS
  Filled 2022-06-18: qty 4000

## 2022-06-18 MED ORDER — POTASSIUM CHLORIDE 20 MEQ PO PACK: 40.0000 meq | PACK | Freq: Two times a day (BID) | ORAL | Status: DC

## 2022-06-18 MED ORDER — PREDNISONE 20 MG PO TABS
40.0000 mg | ORAL_TABLET | Freq: Every day | ORAL | Status: DC
  Administered 2022-06-19: 40 mg via ORAL
  Filled 2022-06-18: qty 2

## 2022-06-18 MED ORDER — MAGNESIUM SULFATE IN D5W 1-5 GM/100ML-% IV SOLN
1.0000 g | Freq: Once | INTRAVENOUS | Status: AC
  Administered 2022-06-18: 1 g via INTRAVENOUS
  Filled 2022-06-18: qty 100

## 2022-06-18 MED ORDER — POTASSIUM CHLORIDE 20 MEQ PO PACK
40.0000 meq | PACK | Freq: Once | ORAL | Status: AC
  Administered 2022-06-18: 40 meq via ORAL
  Filled 2022-06-18: qty 2

## 2022-06-18 MED ORDER — ACETAMINOPHEN 650 MG RE SUPP: 650.0000 mg | Freq: Four times a day (QID) | RECTAL | Status: DC | PRN

## 2022-06-18 MED ORDER — VANCOMYCIN HCL 1250 MG/250ML IV SOLN: 1250.0000 mg | INTRAVENOUS | Status: DC

## 2022-06-18 MED ORDER — MAGNESIUM OXIDE -MG SUPPLEMENT 400 (240 MG) MG PO TABS
400.0000 mg | ORAL_TABLET | Freq: Once | ORAL | Status: AC
  Administered 2022-06-18: 400 mg via ORAL
  Filled 2022-06-18: qty 1

## 2022-06-18 NOTE — Assessment & Plan Note (Signed)
Replace check magnesium level replace as needed check phosphate replace electrolytes and follow

## 2022-06-18 NOTE — Progress Notes (Signed)
ANTICOAGULATION CONSULT NOTE - Initial Consult  Pharmacy Consult for heparin Indication: chest pain/ACS  Allergies  Allergen Reactions   Metformin And Related     Held due to creatinine elevation September 2023.   Budesonide Rash    Drug rash with oral Tarpeyo     Patient Measurements: Weight: 71.1 kg (156 lb 12 oz) Heparin Dosing Weight: 71kg  Vital Signs: Temp: 97.5 F (36.4 C) (11/28 2038) Temp Source: Oral (11/28 2038) BP: 132/78 (11/28 1850) Pulse Rate: 85 (11/28 1850)  Labs: Recent Labs    06/11/2022 1705  HGB 8.6*  HCT 25.9*  PLT 190  LABPROT 15.0  INR 1.2  CREATININE 2.42*  CKTOTAL 63  TROPONINIHS 2,602*    Estimated Creatinine Clearance: 27.3 mL/min (A) (by C-G formula based on SCr of 2.42 mg/dL (H)).   Medical History: Past Medical History:  Diagnosis Date   Arthritis    ASHD (arteriosclerotic heart disease)    Atherosclerosis of native arteries of the extremities with intermittent claudication    Cataract    Coronary atherosclerosis of native coronary artery    Diabetes mellitus    NIDDM   Dyslipidemia    ED (erectile dysfunction)    Full dentures    Glaucoma associated with ocular disorder, mild stage    bilateral   Hyperlipidemia    Hypertension    under control, has been on med. > 9 yr.   Impotence of organic origin    Meniscus tear 05/2012   right knee   Myocardial infarction [facility]) 10/2002   Stented coronary artery      Assessment: 71 yo male presents with SOB and fatigue, medical history significant of squamous carcinoma of the right lung, Stage III, hypertension, hyperlipidemia, obesity, CAD, type 2 diabetes, PAD gout dysphagia electrolyte disbalance this cervical spine stenosis with myelopathy. Pharmacy to dose heparin drip for ACS/STEMI.  No prior AC noted PT/INR WNL Hgb 8.6, plts WNL Trop 2602 Scr 2.42   Goal of Therapy:  Heparin level 0.3-0.7 units/ml Monitor platelets by anticoagulation protocol: Yes   Plan:  Heparin  bolus 4000 units x 1 Start heparin drip at 850 units/hr Heparin level in 8 hours Daily CBC   Dolly Rias RPh 05/25/2022, 10:54 PM

## 2022-06-18 NOTE — ED Notes (Addendum)
After one min without oxygen via [place] pt spo2 dropped to 72 when placed on oxygen again spo2 recovered to 90 within 2 min

## 2022-06-18 NOTE — Progress Notes (Signed)
Pharmacy Antibiotic Note  Jorge Adams is a 71 y.o. male admitted on 06/14/2022 with PNA.  Pharmacy has been consulted for vancomycin and cefepime dosing. Elevated SCr noted on admission. This appears to be his recent baseline, although SCr was < 1.0 as recent as this past August.  Plan: Vancomycin 1250 mg IV q48 hr (est AUC 464 based on SCr 2.42; Vd 0.72) Measure vancomycin AUC at steady state as indicated SCr daily while on vanc MRSA PCR ordered; f/u and narrow vanc as appropriate Cefepime 2 g IV q24 hr     Temp (24hrs), Avg:97.5 F (36.4 C), Min:97.4 F (36.3 C), Max:97.5 F (36.4 C)  Recent Labs  Lab 05/25/2022 1705  WBC 4.5  CREATININE 2.42*  LATICACIDVEN 0.9    CrCl cannot be calculated (Unknown ideal weight.).    Allergies  Allergen Reactions   Metformin And Related     Held due to creatinine elevation September 2023.   Budesonide Rash    Drug rash with oral Tarpeyo     Thank you for allowing pharmacy to be a part of this patient's care.  Joda Braatz A 06/07/2022 9:26 PM

## 2022-06-18 NOTE — H&P (Addendum)
Jorge Adams HEN:[id number] DOB: 08-12-1950 DOA: 06/01/2022   PCP: [name], MD   Outpatient Specialists:  NEphrology: Dr. Hollie Salk    Oncology  Dr. Curt Bears    Patient arrived to ER on 05/29/2022 at 1346 Referred by Attending Audley Hose, MD   Patient coming from:    home Lives  With family    Chief Complaint:   Chief Complaint  Patient presents with   Shortness of Breath    HPI: Jorge Adams is a 71 y.o. male with medical history significant of squamous carcinoma of the right lung, Stage III, hypertension, hyperlipidemia, obesity, CAD, type 2 diabetes, PAD gout dysphagia electrolyte disbalance this cervical spine stenosis with myelopathy   Presented with shortness of breath and fatigue Patient has been having a cough for the past 4 days and shortness of breath on Monday the he had CT scan that showed possible inflammation patient has been taking doxycycline for 2 doses but not making any progress.  Has had decreased p.o. intake and feeling dehydrated Has been getting progressively weak cannot even walk for the past 2 days Has known history of stage III lung cancer and currently undergoing chemotherapy Of note last week he was told that he has UTI and was treated with unknown antibiotics as well  Subjective fever Wife states he had diarrhea on Thursday and Friday now stopped After taking meds for UTI Does not drink or smoke Sating 88% on RA needing 3 L   Initial COVID TEST  NEGATIVE   Lab Results  Component Value Date   SARSCOV2NAA NEGATIVE 06/14/2022   [place] NEGATIVE 02/22/2022   [place] NEGATIVE 02/12/2022   SARSCOV2NAA RESULT: NEGATIVE 11/30/2021     Regarding pertinent Chronic problems:    Hyperlipidemia -  TRICOR, crestor Lipid Panel     Component Value Date/Time   CHOL 129 10/29/2021 1016   CHOL 155 05/02/2017 0000   TRIG 80.0 10/29/2021 1016   TRIG 300 05/02/2017 0000   HDL 54.70 10/29/2021 1016   HDL 40  05/02/2017 0000   CHOLHDL 2 10/29/2021 1016   VLDL 16.0 10/29/2021 1016   LDLCALC 58 10/29/2021 1016   LDLCALC 55 05/02/2017 0000   LDLDIRECT 70.0 11/29/2015 0847     HTN on metoprolol   chronic CHF diastolic  - last echo August 3614 Grade I diastolic dysfunction (impaired  relaxation).  On Demadex    CAD  - On Aspirin, statin, betablocker, Plavix                 - *followed by cardiology                - last cardiac cath  The ASCVD Risk score (Arnett DK, et al., 2019) failed to calculate for the following reasons:   The patient has a prior MI or stroke diagnosis  PAD on Pletal  LUNG Ca - last chemo in [place] and XRT    DM 2 -  Lab Results  Component Value Date   HGBA1C 6.6 (H) 02/22/2022   diet controlled   Hypothyroidism:  Lab Results  Component Value Date   TSH 5.087 (H) 05/22/2022   on synthroid       COPD - not  followed by pulmonology   not  on baseline oxygen    On Duoneb    CKD stage IIV- baseline Cr 3.0 CrCl cannot be calculated (Unknown ideal weight.). Patient states he is taking prednisone for renal disease  Lab  Results  Component Value Date   CREATININE 2.42 (H) 05/23/2022   CREATININE 3.06 (HH) 05/22/2022   CREATININE 3.34 (H) 05/20/2022    Chronic anemia - baseline hg Hemoglobin & Hematocrit  Recent Labs    05/20/22 1852 05/22/22 0934 06/08/2022 1705  HGB 8.6* 9.8* 8.6*     While in ER:    CXR - 1. Worsening coarse interstitial and right upper lung airspace opacities. 2. Small right effusion.  CT  chest  06/17/2022-  IMPRESSION: 1. Interval response to therapy as evidenced by decrease in size of a cavitary mass in the right lower lobe. 2. Interval improvement in interstitial thickening and peribronchovascular nodularity in the right middle and right lower lobes. 3. New patchy ground-glass in the upper lobes and left lower lobe with associated areas of consolidation in the right upper lobe. Findings are likely infectious/inflammatory  in etiology given the relatively short interval from 02/22/2022. 4. Enlarging low right paratracheal lymph node, possibly reactive. Recommend attention on follow-up. 5. Small right pleural effusion, increased. 6. Cylindrical bronchiectasis. 7. Cholelithiasis. 8.  Aortic atherosclerosis (ICD10-I70.0). 9. Enlarged pulmonic trunk, indicative of pulmonary arterial hypertension. 10.  Emphysema (ICD10-J43.9).  Following Medications were ordered in ER: Medications  lactated ringers bolus 500 mL (has no administration in time range)  magnesium oxide (MAG-OX) tablet 400 mg (has no administration in time range)  calcium gluconate 1 g/ 50 mL sodium chloride IVPB (has no administration in time range)  potassium chloride (KLOR-CON) packet 40 mEq (has no administration in time range)  sodium chloride 0.9 % bolus 500 mL (500 mLs Intravenous New Bag/Given 06/19/2022 1752)  cefTRIAXone (ROCEPHIN) 1 g in sodium chloride 0.9 % 100 mL IVPB (0 g Intravenous Stopped 05/25/2022 1830)  doxycycline (VIBRA-TABS) tablet 100 mg (100 mg Oral Given 06/09/2022 1749)        ED Triage Vitals  Enc Vitals Group     BP 06/01/2022 1620 106/72     Pulse Rate 05/30/2022 1620 (!) 102     Resp 06/20/2022 1620 (!) 23     Temp 06/07/2022 1620 (!) 97.4 F (36.3 C)     Temp Source 05/25/2022 1620 Oral     SpO2 06/10/2022 1620 93 %     Weight --      Height --      Head Circumference --      Peak Flow --      Pain Score 06/05/2022 1618 4     Pain Loc --      Pain Edu? --      Excl. in [place]? --   TMAX(24)@     _________________________________________ Significant initial  Findings: Abnormal Labs Reviewed  CBC WITH DIFFERENTIAL/PLATELET - Abnormal; Notable for the following components:      Result Value   RBC 2.89 (*)    Hemoglobin 8.6 (*)    HCT 25.9 (*)    RDW 16.8 (*)    All other components within normal limits  COMPREHENSIVE METABOLIC PANEL - Abnormal; Notable for the following components:   Sodium 132 (*)    Potassium 3.1 (*)     Glucose, Bld 201 (*)    BUN 57 (*)    Creatinine, Ser 2.42 (*)    Calcium 7.5 (*)    Total Protein 5.5 (*)    Albumin 2.0 (*)    AST 43 (*)    GFR, Estimated 28 (*)    All other components within normal limits  MAGNESIUM - Abnormal; Notable for the following components:  Magnesium 1.5 (*)    All other components within normal limits  BRAIN NATRIURETIC PEPTIDE - Abnormal; Notable for the following components:   B Natriuretic Peptide 544.9 (*)    All other components within normal limits  BLOOD GAS, VENOUS - Abnormal; Notable for the following components:   pCO2, Ven 40 (*)    All other components within normal limits     ECG: Ordered Personally reviewed and interpreted by me showing: HR : 97 Rhythm: Sinus rhythm RSR' in V1 or V2, right VCD or RVH Borderline prolonged QT interval QTC 438     The recent clinical data is shown below. Vitals:   06/01/2022 1730 05/22/2022 1745 06/08/2022 1800 06/03/2022 1850  BP: (!) 141/87 (!) 145/81 129/78 132/78  Pulse: 89 89 89 85  Resp: 19 17 20 20   Temp:      TempSrc:      SpO2: 98% 99% 98% 97%     WBC     Component Value Date/Time   WBC 4.5 06/06/2022 1705   LYMPHSABS 0.7 06/05/2022 1705   MONOABS 0.1 05/27/2022 1705   EOSABS 0.0 06/17/2022 1705   BASOSABS 0.0 06/16/2022 1705      Procalcitonin   Ordered   UA  ordered    Results for orders placed or performed during the hospital encounter of 04/10/22  Surgical PCR screen     Status: None   Collection Time: 04/12/22  9:54 AM   Specimen: Nasal Mucosa; Nasal Swab  Result Value Ref Range Status   MRSA, PCR NEGATIVE NEGATIVE Final   Staphylococcus aureus NEGATIVE NEGATIVE Final    Comment: (NOTE) The Xpert SA Assay (FDA approved for NASAL specimens in patients 23 years of age and older), is one component of a comprehensive surveillance program. It is not intended to diagnose infection nor to guide or monitor treatment. Performed at [facility], [place]  [address] [place], [place] [address]      _______________________________________________ Hospitalist was called for admission for CAD acute respiratory failure   The following Work up has been ordered so far:  Orders Placed This Encounter  Procedures   Blood culture (routine x 2)   Resp Panel by RT-PCR (Flu A&B, Covid) Anterior Nasal Swab   DG Chest Portable 1 View   CBC with Differential   Comprehensive metabolic panel   Magnesium   Brain natriuretic peptide   Blood gas, venous (at [facility] and [facility], not at [facility])   Cardiac monitoring   Nursing Communication Okay to access port   Consult to hospitalist   Pulse oximetry, continuous   EKG 12-Lead     OTHER Significant initial  Findings:  labs showing:  Recent Labs  Lab 05/25/2022 1705  NA 132*  K 3.1*  CO2 23  GLUCOSE 201*  BUN 57*  CREATININE 2.42*  CALCIUM 7.5*  MG 1.5*    Cr   stable,    Lab Results  Component Value Date   CREATININE 2.42 (H) 06/12/2022   CREATININE 3.06 (HH) 05/22/2022   CREATININE 3.34 (H) 05/20/2022    Recent Labs  Lab 05/26/2022 1705  AST 43*  ALT 21  ALKPHOS 52  BILITOT 0.8  PROT 5.5*  ALBUMIN 2.0*   Lab Results  Component Value Date   CALCIUM 7.5 (L) 05/27/2022   PHOS 3.6 01/11/2016       Plt: Lab Results  Component Value Date   PLT 190 05/26/2022    COVID-19 Labs  No results for input(s): "DDIMER", "  FERRITIN", "LDH", "CRP" in the last 72 hours.  Lab Results  Component Value Date   SARSCOV2NAA NEGATIVE 02/22/2022   SARSCOV2NAA NEGATIVE 02/12/2022   SARSCOV2NAA RESULT: NEGATIVE 11/30/2021    Venous  Blood Gas result:  pH   Latest Reference Range & Units Most Recent  pH, Ven 7.25 - 7.43  7.41 06/13/2022 17:46  pCO2, Ven 44 - 60 mmHg 40 (L) 05/25/2022 17:46  pO2, Ven 32 - 45 mmHg 36 06/11/2022 17:46  (L): Data is abnormally low  Recent Labs  Lab 05/29/2022 1705  WBC 4.5  NEUTROABS 3.7  HGB 8.6*  HCT 25.9*  MCV 89.6  PLT 190    HG/HCT   stable,     Component  Value Date/Time   HGB 8.6 (L) 05/31/2022 1705   HGB 9.8 (L) 05/22/2022 0934   HCT 25.9 (L) 06/02/2022 1705   MCV 89.6 05/29/2022 1705     BNP (last 3 results) Recent Labs    02/22/22 0810 06/07/2022 1705  BNP 65.3 544.9*    DM  labs:  HbA1C: Recent Labs    10/29/21 1016 02/22/22 0810  HGBA1C 6.4 6.6*    CBG (last 3)  No results for input(s): "GLUCAP" in the last 72 hours.    Cultures:    Component Value Date/Time   SDES  02/22/2022 0950    BLOOD SITE NOT SPECIFIED Performed at [facility], [place] [address], [place], [place] [address]    SPECREQUEST  02/22/2022 0950    BOTTLES DRAWN AEROBIC AND ANAEROBIC Blood Culture adequate volume Performed at [place] [address] [place], [place] [address]    CULT  02/22/2022 0950    NO GROWTH 5 DAYS Performed at [facility], [place] [address] [place], [place] [address]    REPTSTATUS 02/27/2022 FINAL 02/22/2022 0950     Radiological Exams on Admission: DG Chest Portable 1 View  Result Date: 06/20/2022 CLINICAL DATA:  Per chart: Patient has shortness of breath and feeling weak where he cannot walk the past 2 days. He received a CT scan yesterday that showed an infection. Started doxycycline. Patient has lung cancer stage 3, currently getting chemo. EXAM: PORTABLE CHEST - 1 VIEW COMPARISON:  05/20/2022 FINDINGS: Stable right IJ port to the proximal right atrium. Worsening coarse interstitial throughout both lungs, with some coarse airspace opacities throughout the right upper lung. Heart size and mediastinal contours are within normal limits. CABG markers. Blunting of the right lateral costophrenic angle suggesting small effusion. Sternotomy wires.  Cervical fixation hardware partially visualized. IMPRESSION: 1. Worsening coarse interstitial and right upper lung airspace opacities. 2. Small right effusion. Electronically Signed   By: Lucrezia Europe M.D.   On: 06/10/2022 19:33   CT Chest Wo  Contrast  Result Date: 06/17/2022 CLINICAL DATA:  Non-small cell lung cancer, assess treatment response. Ongoing immunotherapy. Chronic cough. Shortness of breath. * Tracking Code: BO * EXAM: CT CHEST WITHOUT CONTRAST TECHNIQUE: Multidetector CT imaging of the chest was performed following the standard protocol without IV contrast. RADIATION DOSE REDUCTION: This exam was performed according to the departmental dose-optimization program which includes automated exposure control, adjustment of the mA and/or kV according to patient size and/or use of iterative reconstruction technique. COMPARISON:  02/22/2022. FINDINGS: Cardiovascular: Right IJ Port-A-Cath terminates in the high right atrium. Atherosclerotic calcification of the aorta and aortic valve. Enlarged pulmonic trunk. Heart is at the upper limits of normal in size. No pericardial effusion. Mediastinum/Nodes: 1.4 cm low right paratracheal  lymph node, increased from 9 mm. Additional mediastinal lymph nodes are not enlarged by CT size criteria. Hilar regions are difficult to evaluate without IV contrast. No axillary adenopathy. Esophagus is grossly unremarkable. Lungs/Pleura: Centrilobular and paraseptal emphysema. Thick-walled cavitary mass in the right lower lobe has decreased in size, now measuring 2.6 x 3.9 cm (5/74), previously approximately 5.3 x 7.5 cm. Interstitial thickening and nodularity in the right middle and right lower lobes have improved in the interval as well. New patchy ground-glass in the upper lobes and left lower lobe, with scattered consolidation in the right upper lobe, new. Cylindrical bronchiectasis. Small right effusion, increased. No left pleural fluid. Airway is otherwise unremarkable. Upper Abdomen: Visualized portion of the liver is grossly unremarkable. Stones in the gallbladder. Visualized portions of the adrenal glands and kidneys are unremarkable. Spleen measures at the upper limits of normal, 12.9 cm. Visualized portions of  the pancreas, stomach and bowel are grossly unremarkable. Atherosclerotic calcification of the aorta. Renal vascular calcifications. No pathologically enlarged lymph nodes. Musculoskeletal: Degenerative changes in the spine. No worrisome lytic or sclerotic lesions. IMPRESSION: 1. Interval response to therapy as evidenced by decrease in size of a cavitary mass in the right lower lobe. 2. Interval improvement in interstitial thickening and peribronchovascular nodularity in the right middle and right lower lobes. 3. New patchy ground-glass in the upper lobes and left lower lobe with associated areas of consolidation in the right upper lobe. Findings are likely infectious/inflammatory in etiology given the relatively short interval from 02/22/2022. 4. Enlarging low right paratracheal lymph node, possibly reactive. Recommend attention on follow-up. 5. Small right pleural effusion, increased. 6. Cylindrical bronchiectasis. 7. Cholelithiasis. 8.  Aortic atherosclerosis (ICD10-I70.0). 9. Enlarged pulmonic trunk, indicative of pulmonary arterial hypertension. 10.  Emphysema (ICD10-J43.9). Electronically Signed   By: Lorin Picket M.D.   On: 06/17/2022 09:04   _______________________________________________________________________________________________________ Latest  Blood pressure 132/78, pulse 85, temperature (!) 97.4 F (36.3 C), temperature source Oral, resp. rate 20, SpO2 97 %.   Vitals  labs and radiology finding personally reviewed  Review of Systems:    Pertinent positives include:    Fevers, chills, fatigue, shortness of breath at rest.   dyspnea on exertion, Constitutional:  No weight loss, night sweats,weight loss  HEENT:  No headaches, Difficulty swallowing,Tooth/dental problems,Sore throat,  No sneezing, itching, ear ache, nasal congestion, post nasal drip,  Cardio-vascular:  No chest pain, Orthopnea, PND, anasarca, dizziness, palpitations.no Bilateral lower extremity swelling  GI:  No  heartburn, indigestion, abdominal pain, nausea, vomiting, diarrhea, change in bowel habits, loss of appetite, melena, blood in stool, hematemesis Resp:  no  No excess mucus, no productive cough, No non-productive cough, No coughing up of blood.No change in color of mucus.No wheezing. Skin:  no rash or lesions. No jaundice GU:  no dysuria, change in color of urine, no urgency or frequency. No straining to urinate.  No flank pain.  Musculoskeletal:  No joint pain or no joint swelling. No decreased range of motion. No back pain.  Psych:  No change in mood or affect. No depression or anxiety. No memory loss.  Neuro: no localizing neurological complaints, no tingling, no weakness, no double vision, no gait abnormality, no slurred speech, no confusion  All systems reviewed and apart from [place] all are negative _______________________________________________________________________________________________ Past Medical History:   Past Medical History:  Diagnosis Date   Arthritis    ASHD (arteriosclerotic heart disease)    Atherosclerosis of native arteries of the extremities with intermittent claudication  Cataract    Coronary atherosclerosis of native coronary artery    Diabetes mellitus    NIDDM   Dyslipidemia    ED (erectile dysfunction)    Full dentures    Glaucoma associated with ocular disorder, mild stage    bilateral   Hyperlipidemia    Hypertension    under control, has been on med. > 9 yr.   Impotence of organic origin    Meniscus tear 05/2012   right knee   Myocardial infarction [facility]) 10/2002   Stented coronary artery       Past Surgical History:  Procedure Laterality Date   ANTERIOR CERVICAL DECOMP/DISCECTOMY FUSION N/A 05/07/2018   Procedure: ANTERIOR CERVICAL DECOMPRESSION/DISCECTOMY FUSION CERVICAL FIVE - CERVICAL SIX;  Surgeon: Consuella Lose, MD;  Location: [facility];  Service: Neurosurgery;  Laterality: N/A;  ANTERIOR CERVICAL DECOMPRESSION/DISCECTOMY FUSION  CERVICAL FIVE - CERVICAL SIX   BRONCHIAL BIOPSY  12/04/2021   Procedure: BRONCHIAL BIOPSIES;  Surgeon: Collene Gobble, MD;  Location: [place];  Service: Cardiopulmonary;;   BRONCHIAL BRUSHINGS  12/04/2021   Procedure: BRONCHIAL BRUSHINGS;  Surgeon: Collene Gobble, MD;  Location: [place];  Service: Cardiopulmonary;;   BRONCHIAL NEEDLE ASPIRATION BIOPSY  12/04/2021   Procedure: BRONCHIAL NEEDLE ASPIRATION BIOPSIES;  Surgeon: Collene Gobble, MD;  Location: [place];  Service: Cardiopulmonary;;   CARDIAC CATHETERIZATION  10/23/2002   had 1 stent placed then-- by Dr. Melvern Banker   CARDIAC CATHETERIZATION N/A 12/21/2015   Procedure: Left Heart Cath and Coronary Angiography;  Surgeon: Adrian Prows, MD;  Location: [place];  Service: Cardiovascular;  Laterality: N/A;   CATARACT EXTRACTION Right 05/2017   CATARACT EXTRACTION Left 07/2017   COLONOSCOPY  10/08/2010   Dr.Patterson   CORONARY ARTERY BYPASS GRAFT N/A 01/02/2016   Procedure: CORONARY ARTERY BYPASS GRAFTING (CABG) TIMES 3 USING LEFT INTERNAL MAMMARY ARTERY AND RIGHT SAPHENOUS LEG VEIN HARVESTED ENDOSCOPICALLY;  Surgeon: Ivin Poot, MD;  Location: [facility];  Service: Open Heart Surgery;  Laterality: N/A;   EYE SURGERY     INSERTION OF MESH N/A 05/06/2016   Procedure: INSERTION OF MESH;  Surgeon: Georganna Skeans, MD;  Location: [place];  Service: General;  Laterality: N/A;   IR IMAGING GUIDED PORT INSERTION  04/12/2022   KNEE ARTHROSCOPY WITH MEDIAL MENISECTOMY  06/04/2012   Procedure: KNEE ARTHROSCOPY WITH MEDIAL MENISECTOMY;  Surgeon: Ninetta Lights, MD;  Location: [place];  Service: Orthopedics;  Laterality: Right;  RIGHT SCOPE MEDIAL MENISCECTOMY, CHONDROPLASTY, EXCISION LOOSE BODY   LOWER EXTREMITY ANGIOGRAM N/A 11/23/2013   Procedure: LOWER EXTREMITY ANGIOGRAM;  Surgeon: Laverda Page, MD;  Location: [facility] CATH LAB;  Service: Cardiovascular;  Laterality: N/A;   NECK SURGERY N/A    02/19/2018    PERIPHERAL VASCULAR CATHETERIZATION N/A 12/21/2015   Procedure: Abdominal Aortogram;  Surgeon: Adrian Prows, MD;  Location: [place] [facility];  Service: Cardiovascular;  Laterality: N/A;   PERIPHERAL VASCULAR CATHETERIZATION N/A 12/21/2015   Procedure: Abdominal Aortogram w/Lower Extremity;  Surgeon: Adrian Prows, MD;  Location: [place] [facility];  Service: Cardiovascular;  Laterality: N/A;   TEE WITHOUT CARDIOVERSION N/A 01/02/2016   Procedure: TRANSESOPHAGEAL ECHOCARDIOGRAM (TEE);  Surgeon: Ivin Poot, MD;  Location: [place] [facility];  Service: Open Heart Surgery;  Laterality: N/A;   UMBILICAL HERNIA REPAIR N/A 05/06/2016   Procedure: HERNIA REPAIR UMBILICAL ADULT;  Surgeon: Georganna Skeans, MD;  Location: [place];  Service: General;  Laterality: N/A;   VIDEO BRONCHOSCOPY N/A 12/04/2021   Procedure:  VIDEO BRONCHOSCOPY WITH FLUORO;  Surgeon: Collene Gobble, MD;  Location: [facility] ENDOSCOPY;  Service: Cardiopulmonary;  Laterality: N/A;    Social History:  Ambulatory  walker       reports that he quit smoking about 19 years ago. His smoking use included cigarettes. He has a 87.50 pack-year smoking history. He has never used smokeless tobacco. He reports that he does not currently use alcohol. He reports that he does not use drugs.     Family History:   Family History  Problem Relation Age of Onset   Bone cancer Mother    Pneumonia Father    Diabetes Brother    Heart disease Brother        Pacemaker   COPD Brother    Colon cancer Neg Hx    Prostate cancer Neg Hx    Esophageal cancer Neg Hx    Stomach cancer Neg Hx    ______________________________________________________________________________________________ Allergies: Allergies  Allergen Reactions   Metformin And Related     Held due to creatinine elevation September 2023.   Budesonide Rash    Drug rash with oral [place]      Prior to Admission medications   Medication Sig Start Date End Date Taking? Authorizing Provider  albuterol  (VENTOLIN HFA) 108 (90 Base) MCG/ACT inhaler Inhale 1-2 puffs into the lungs every 6 (six) hours as needed (for cough.  okay to fill with albuterol/proair/ventolin.). 11/06/21   [name], MD  aspirin EC 81 MG tablet Take 1 tablet (81 mg total) by mouth daily. Okay to restart this medication on 12/05/2021. 12/04/21   Collene Gobble, MD  brimonidine (ALPHAGAN P) 0.1 % SOLN Place 2 drops into both eyes 2 (two) times daily.    [provider]  cilostazol (PLETAL) 100 MG tablet TAKE 1 TABLET BY MOUTH TWICE A DAY 04/17/22   [name], MD  Cyanocobalamin (VITAMIN B12 PO) Take 1 tablet by mouth in the morning and at bedtime.    [provider]  dorzolamide-timolol (COSOPT) 22.3-6.8 MG/ML ophthalmic solution Place 1 drop into both eyes 2 (two) times daily.    [provider]  doxycycline (VIBRA-TABS) 100 MG tablet Take 1 tablet (100 mg total) by mouth 2 (two) times daily. 06/17/22   Heilingoetter, [name] L, PA-C  FeFum-FePoly-FA-B Cmp-C-Biot (FOLIVANE-PLUS) CAPS TAKE 1 CAPSULE BY MOUTH EVERY DAY IN THE MORNING 05/31/22   Heilingoetter, [name] L, PA-C  fenofibrate (TRICOR) 145 MG tablet TAKE 1 TABLET BY MOUTH EVERY DAY IN THE EVENING Patient taking differently: Take 145 mg by mouth daily. 01/03/22   Adrian Prows, MD  HYDROcodone bit-homatropine (HYCODAN) 5-1.5 MG/5ML syrup Take 5 mLs by mouth every 8 (eight) hours as needed for cough (sedation caution.). 06/04/22   [name], MD  ipratropium-albuterol (DUONEB) 0.5-2.5 (3) MG/3ML SOLN Take 3 mLs by nebulization every 6 (six) hours as needed. 02/25/22   Oswald Hillock, MD  levothyroxine (SYNTHROID) 50 MCG tablet TAKE 1 TABLET BY MOUTH EVERY DAY BEFORE BREAKFAST Patient taking differently: Take 50 mcg by mouth daily before breakfast. 05/15/22   Curt Bears, MD  Magnesium Oxide 250 MG TABS Take 1 tablet (250 mg total) by mouth daily. 05/05/22   [name], MD  metoprolol tartrate (LOPRESSOR) 25 MG tablet Take  25 mg by mouth 2 (two) times daily. 05/20/22   [provider]  mirtazapine (REMERON) 7.5 MG tablet TAKE 1 TABLET BY MOUTH AT BEDTIME. 05/26/22   [name], MD  Netarsudil-Latanoprost [facility])  0.02-0.005 % SOLN Apply 1 drop to eye at bedtime. In both eyes    [provider]  nitroGLYCERIN (NITROSTAT) 0.4 MG SL tablet Place 1 tablet (0.4 mg total) under the tongue every 5 (five) minutes as needed for chest pain. 11/06/21   [name], MD  omega-3 acid ethyl esters (LOVAZA) 1 g capsule TAKE 1 CAPSULE BY MOUTH TWICE A DAY Patient taking differently: Take 1 g by mouth 2 (two) times daily. 01/03/22   Adrian Prows, MD  potassium chloride 20 MEQ/15ML (10%) SOLN TAKE 30 MLS (40 MEQ TOTAL) BY MOUTH 2 (TWO) TIMES DAILY. 05/08/22   [name], MD  predniSONE (DELTASONE) 20 MG tablet Take 2 a day for 5 days, then 1 a day for 5 days, with food. Don't take with aleve/ibuprofen. Patient not taking: Reported on 05/20/2022 05/10/22   [name], MD  prochlorperazine (COMPAZINE) 10 MG tablet TAKE 1 TABLET BY MOUTH EVERY 6 HOURS AS NEEDED FOR NAUSEA OR VOMITING. Patient taking differently: Take 10 mg by mouth every 6 (six) hours as needed for nausea or vomiting. 05/10/22   Heilingoetter, [name] L, PA-C  rosuvastatin (CRESTOR) 20 MG tablet TAKE 1 TABLET BY MOUTH EVERY DAY Patient taking differently: Take 20 mg by mouth daily. 04/17/22   Adrian Prows, MD  torsemide (DEMADEX) 20 MG tablet Take 2 tablets (40 mg total) by mouth 2 (two) times daily. 05/02/22   [name], MD  triamcinolone cream (KENALOG) 0.1 % Apply 1 Application topically 2 (two) times daily. 05/02/22   [name], MD    ___________________________________________________________________________________________________ Physical Exam:    06/20/2022    6:50 PM 06/01/2022    6:00 PM 05/24/2022    5:45 PM  Vitals with BMI  Systolic [phone]  Diastolic 78 78 81  Pulse 85 89 89     1.  General:  in No  Acute distress    Chronically ill   -appearing 2. Psychological: Alert and   Oriented 3. Head/ENT:   Dry Mucous Membranes                          Head Non traumatic, neck supple                          Poor Dentition 4. SKIN:  decreased Skin turgor,  Skin clean Dry and intact no rash 5. Heart: Regular rate and rhythm no  Murmur, no Rub or gallop 6. Lungs: no wheezes some crackles , diminished on the right 7. Abdomen: Soft,  non-tender, Non distended bowel sounds present 8. Lower extremities: no clubbing, cyanosis, no  edema 9. Neurologically Grossly intact, moving all 4 extremities equally   10. MSK: Normal range of motion    Chart has been reviewed  ______________________________________________________________________________________________  Assessment/Plan 71 y.o. male with medical history significant of squamous carcinoma of the right lung, Stage III, hypertension, hyperlipidemia, obesity, CAD, type 2 diabetes, PAD gout dysphagia electrolyte disbalance this cervical spine stenosis with myelopathy    Admitted for CAP, acute respiratory failure   Present on Admission:  CAP (community acquired pneumonia)  Hypertension associated with diabetes ([place])  Hyperlipidemia LDL goal <70  Type 2 diabetes mellitus with vascular disease ([place])  CAD (coronary artery disease)  Hypokalemia  PAD (peripheral artery disease) (HCC)  Stage III squamous cell carcinoma of right lung (HCC)  Normocytic anemia  Hypomagnesemia  CKD (chronic kidney disease), stage  IV (HCC)  Chronic diastolic CHF (congestive heart failure) (HCC)  Acute respiratory failure with hypoxia (HCC)  Elevated troponin     CAP (community acquired pneumonia) Failed outpatient treatment history of chemotherapy Given suspected compromised immune system Due to brought spectrum coverage for right now with cefepime and bank.  Check MRSA.  Obtain blood cultures sputum cultures check for Legionella and strep  pneumo  Hypertension associated with diabetes ([place]) Allow permissive hypertension for tonight  Hyperlipidemia LDL goal <70 Continue Crestor 20 mg a day and Tricor 145 mg a day  Type 2 diabetes mellitus with vascular disease ([place]) Order sliding scale check hemoglobin A1c check TSH  CAD (coronary artery disease) Continue aspirin Crestor 20 mg p.o. daily hold beta-blocker for tonight restart when able  Hypokalemia Replace check magnesium level replace as needed check phosphate replace electrolytes and follow  PAD (peripheral artery disease) (HCC) Continue Pletal And Crestor 20 mg p.o. daily  Stage III squamous cell carcinoma of right lung ([place]) Emailed Dr. Earlie Server the patient is being admitted to the hospital  Normocytic anemia Chronic but progressive will check anemia panel transfuse as needed  Hypomagnesemia Replace and recheck  CKD (chronic kidney disease), stage IV (HCC)  -chronic avoid nephrotoxic medications such as NSAIDs, Vanco Zosyn combo,  avoid hypotension, continue to follow renal function   Chronic diastolic CHF (congestive heart failure) (HCC) - currently appears to be slightly on the dry side, hold home diuretics for tonight and restart when appears euvolemic, carefuly follow fluid status and Cr   Acute respiratory failure with hypoxia (HCC)  this patient has acute respiratory failure with Hypoxia  as documented by the presence of following: O2 saturatio< 90% on RA   Likely due to:   Pneumonia,   Provide O2 therapy and titrate as needed  Continuous pulse ox   check Pulse ox with ambulation prior to discharge    flutter valve ordered   Elevated troponin No chest pain discussed with cardiology most likely demand ischemia will obtain echogram for right now cover definitive heparin per pharmacy until results of echogram is back.  Given CKD probably very poor clearance.  Continue serial EKGs    Other plan as per orders.  DVT prophylaxis:  SCD     Code  Status:    Code Status: Prior FULL CODE  as per patient   I had personally discussed CODE STATUS with patient and family    Family Communication:   Family  at  Bedside  plan of care was discussed   with  Wife,    Disposition Plan:     To home once workup is complete and patient is stable   Following barriers for discharge:                            Electrolytes corrected                               Anemia stable                             white count improving able to transition to PO antibiotics                     Would benefit from PT/OT eval prior to DC  Ordered  Consults called: emailed Oncology that pt has been admitted  Admission status:  ED Disposition     ED Disposition  [place]: [facility] [[id number]]  Level of Care: Telemetry [5]  Admit to tele based on following criteria: Other see comments  Comments: acute hypoxia  May admit patient to [facility] or [facility] if equivalent level of care is available:: No  Covid Evaluation: Symptomatic Person Under Investigation (PUI) or recent exposure (last 10 days) *Testing Required*  Diagnosis: CAP (community acquired pneumonia) [379024]  Admitting Physician: Toy Baker [[id number]  Attending Physician: Toy Baker [[id number]]  Certification:: I certify this patient will need inpatient services for at least 2 midnights  Estimated Length of Stay: 2           inpatient     I Expect 2 midnight stay secondary to severity of patient's current illness need for inpatient interventions justified by the following:  hemodynamic instability despite optimal treatment ( hypoxia,  )  Severe lab/radiological/exam abnormalities including:    CAP and extensive comorbidities including:   CHF   COPD/asthma  CKD  malignancy,    That are currently affecting medical management.   I expect  patient to be hospitalized for 2 midnights  requiring inpatient medical care.  Patient is at high risk for adverse outcome (such as loss of life or disability) if not treated.  Indication for inpatient stay as follows:    New or worsening hypoxia   Need for IV antibiotics, IV fluids,      Level of care     progressive    Lab Results  Component Value Date   [place] NEGATIVE 02/22/2022     Precautions: admitted as   Covid Negative  k for false native test result   Jorge Adams 05/27/2022, 10:48 PM    [facility]     after 2 AM please page floor coverage PA If 7AM-7PM, please contact the day team taking care of the patient using [email or web address]   Patient was evaluated in the context of the global COVID-19 pandemic, which necessitated consideration that the patient might be at risk for infection with the SARS-CoV-2 virus that causes COVID-19. Institutional protocols and algorithms that pertain to the evaluation of patients at risk for COVID-19 are in a state of rapid change based on information released by regulatory bodies including the CDC and federal and state organizations. These policies and algorithms were followed during the patient's care.

## 2022-06-18 NOTE — Telephone Encounter (Signed)
Pt's wife, Juliann Pulse called stating before, Damita Dunnings called the EMS for pt due to him being dehydrated & Damita Dunnings called ER to put in orders with fluid. Juliann Pulse mentioned it shorten the wait time at the ER. Juliann Pulse is asking could it be done again for pt? Call back # [phone]

## 2022-06-18 NOTE — Telephone Encounter (Signed)
Please call ER about pending arrival.  Thanks.

## 2022-06-18 NOTE — Assessment & Plan Note (Signed)
this patient has acute respiratory failure with Hypoxia  as documented by the presence of following: O2 saturatio< 90% on RA   Likely due to:   Pneumonia,   Provide O2 therapy and titrate as needed  Continuous pulse ox   check Pulse ox with ambulation prior to discharge    flutter valve ordered

## 2022-06-18 NOTE — Assessment & Plan Note (Signed)
Failed outpatient treatment history of chemotherapy Given suspected compromised immune system Due to brought spectrum coverage for right now with cefepime and bank.  Check MRSA.  Obtain blood cultures sputum cultures check for Legionella and strep pneumo

## 2022-06-18 NOTE — ED Triage Notes (Signed)
Patient has shortness of breath and feeling weak where he cannot walk the past 2 days. He received a CT scan yesterday that showed an infection. Started doxycycline. Patient has lung cancer stage 3, currently getting chemo.

## 2022-06-18 NOTE — Telephone Encounter (Signed)
Per EMR patient has already arrived at ED

## 2022-06-18 NOTE — Assessment & Plan Note (Signed)
No chest pain discussed with cardiology most likely demand ischemia will obtain echogram for right now cover definitive heparin per pharmacy until results of echogram is back.  Given CKD probably very poor clearance.  Continue serial EKGs

## 2022-06-18 NOTE — Telephone Encounter (Signed)
Spoke with Dr. Damita Dunnings who advised pt to go to ED if symptoms had not improved with the treatments they have tried at home.  Called and spoke with Juliann Pulse, pt's wife. She verbalizes understanding that pt needs to go to ED.  Did ask if Dr. Damita Dunnings could call ED, I let her know that when pt arrives to ED he would be triaged and evaluated for needs.  She verbalized understanding.

## 2022-06-18 NOTE — Assessment & Plan Note (Signed)
Allow permissive hypertension for tonight 

## 2022-06-18 NOTE — Assessment & Plan Note (Signed)
Order sliding scale check hemoglobin A1c check TSH

## 2022-06-18 NOTE — Assessment & Plan Note (Signed)
-  chronic avoid nephrotoxic medications such as NSAIDs, Vanco Zosyn combo,  avoid hypotension, continue to follow renal function  

## 2022-06-18 NOTE — ED Provider Notes (Incomplete)
[facility] DEPT Provider Note   CSN: [id number] Arrival date & time: 05/31/2022  1346     History {Add pertinent medical, surgical, social history, OB history to HPI:1} Chief Complaint  Patient presents with  . Shortness of Breath    Jorge Adams is a 71 y.o. male with squamous of carcinoma of right lung stage III, HTN, HLD, obesity, CAD, T2DM, PAD, gout, dysphagia, hypomagnesemia/hypokalemia, hypocalcemia, diarrhea, cervical spine stenosis with myelopathy who presents with shortness of breath.    Pt complains of shortness of breath and weakness for approximately one week.  States that he has just been very weak, and had multiple episodes of diarrhea.  Endorses increased shortness of breath, worse with exertion, and even with rolling over in bed. He had a CT chest w/o contrast performed yesterday as part of routine cancer surveillance and was called to say that showed multilobar pneumonia.  Has been on doxycycline for 24 hours, taken two doses, without improvement of symptoms. Also was told last week that he had a UTI and was treated with 2 doses of powder per wife.  For patient's lung cancer he had done chemo and radiation several years ago and immunotherapy but has not been on any treatment recently.   Shortness of Breath      Home Medications Prior to Admission medications   Medication Sig Start Date End Date Taking? Authorizing Provider  albuterol (VENTOLIN HFA) 108 (90 Base) MCG/ACT inhaler Inhale 1-2 puffs into the lungs every 6 (six) hours as needed (for cough.  okay to fill with albuterol/proair/ventolin.). 11/06/21   [name], MD  aspirin EC 81 MG tablet Take 1 tablet (81 mg total) by mouth daily. Okay to restart this medication on 12/05/2021. 12/04/21   Collene Gobble, MD  brimonidine (ALPHAGAN P) 0.1 % SOLN Place 2 drops into both eyes 2 (two) times daily.    [provider]  cilostazol (PLETAL) 100 MG tablet TAKE 1 TABLET  BY MOUTH TWICE A DAY 04/17/22   [name], MD  Cyanocobalamin (VITAMIN B12 PO) Take 1 tablet by mouth in the morning and at bedtime.    [provider]  dorzolamide-timolol (COSOPT) 22.3-6.8 MG/ML ophthalmic solution Place 1 drop into both eyes 2 (two) times daily.    [provider]  doxycycline (VIBRA-TABS) 100 MG tablet Take 1 tablet (100 mg total) by mouth 2 (two) times daily. 06/17/22   Heilingoetter, [name] L, PA-C  FeFum-FePoly-FA-B Cmp-C-Biot (FOLIVANE-PLUS) CAPS TAKE 1 CAPSULE BY MOUTH EVERY DAY IN THE MORNING 05/31/22   Heilingoetter, [name] L, PA-C  fenofibrate (TRICOR) 145 MG tablet TAKE 1 TABLET BY MOUTH EVERY DAY IN THE EVENING Patient taking differently: Take 145 mg by mouth daily. 01/03/22   Adrian Prows, MD  HYDROcodone bit-homatropine (HYCODAN) 5-1.5 MG/5ML syrup Take 5 mLs by mouth every 8 (eight) hours as needed for cough (sedation caution.). 06/04/22   [name], MD  ipratropium-albuterol (DUONEB) 0.5-2.5 (3) MG/3ML SOLN Take 3 mLs by nebulization every 6 (six) hours as needed. 02/25/22   Oswald Hillock, MD  levothyroxine (SYNTHROID) 50 MCG tablet TAKE 1 TABLET BY MOUTH EVERY DAY BEFORE BREAKFAST Patient taking differently: Take 50 mcg by mouth daily before breakfast. 05/15/22   Curt Bears, MD  Magnesium Oxide 250 MG TABS Take 1 tablet (250 mg total) by mouth daily. 05/05/22   [name], MD  metoprolol tartrate (LOPRESSOR) 25 MG tablet Take 25 mg by mouth 2 (two) times daily. 05/20/22  [provider]  mirtazapine (REMERON) 7.5 MG tablet TAKE 1 TABLET BY MOUTH AT BEDTIME. 05/26/22   [name], MD  Netarsudil-Latanoprost (ROCKLATAN) 0.02-0.005 % SOLN Apply 1 drop to eye at bedtime. In both eyes    [provider]  nitroGLYCERIN (NITROSTAT) 0.4 MG SL tablet Place 1 tablet (0.4 mg total) under the tongue every 5 (five) minutes as needed for chest pain. 11/06/21   [name], MD  omega-3 acid ethyl esters  (LOVAZA) 1 g capsule TAKE 1 CAPSULE BY MOUTH TWICE A DAY Patient taking differently: Take 1 g by mouth 2 (two) times daily. 01/03/22   Adrian Prows, MD  potassium chloride 20 MEQ/15ML (10%) SOLN TAKE 30 MLS (40 MEQ TOTAL) BY MOUTH 2 (TWO) TIMES DAILY. 05/08/22   [name], MD  predniSONE (DELTASONE) 20 MG tablet Take 2 a day for 5 days, then 1 a day for 5 days, with food. Don't take with aleve/ibuprofen. Patient not taking: Reported on 05/20/2022 05/10/22   [name], MD  prochlorperazine (COMPAZINE) 10 MG tablet TAKE 1 TABLET BY MOUTH EVERY 6 HOURS AS NEEDED FOR NAUSEA OR VOMITING. Patient taking differently: Take 10 mg by mouth every 6 (six) hours as needed for nausea or vomiting. 05/10/22   Heilingoetter, [name] L, PA-C  rosuvastatin (CRESTOR) 20 MG tablet TAKE 1 TABLET BY MOUTH EVERY DAY Patient taking differently: Take 20 mg by mouth daily. 04/17/22   Adrian Prows, MD  torsemide (DEMADEX) 20 MG tablet Take 2 tablets (40 mg total) by mouth 2 (two) times daily. 05/02/22   [name], MD  triamcinolone cream (KENALOG) 0.1 % Apply 1 Application topically 2 (two) times daily. 05/02/22   [name], MD      Allergies    Metformin and related and Budesonide    Review of Systems   Review of Systems  Respiratory:  Positive for shortness of breath.    Review of systems positive for f/c.  A 10 point review of systems was performed and is negative unless otherwise reported in HPI.  Physical Exam Updated Vital Signs BP 106/72   Pulse (!) 102   Temp (!) 97.4 F (36.3 C) (Oral)   Resp (!) 23   SpO2 (!) 88%  Physical Exam General: Chronically ill appearing male, lying in bed.  HEENT: PERRLA, Sclera anicteric, MMM, trachea midline. Cardiology: RRR, no murmurs/rubs/gallops. BL radial and DP pulses equal bilaterally.  Resp: Tachypneic rate with mildly increased respiratory effort. CTAB, no wheezes, rhonchi, crackles.  Abd: Soft, non-tender, non-distended. No rebound  tenderness or guarding.  GU: Deferred. MSK: No peripheral edema or signs of trauma. Extremities without deformity or TTP. No cyanosis or clubbing. Skin: warm, dry. No rashes or lesions. Back: No CVA tenderness Neuro: A&Ox4, CNs II-XII grossly intact. MAEs. Sensation grossly intact.  Psych: Normal mood and affect.   ED Results / Procedures / Treatments   Labs (all labs ordered are listed, but only abnormal results are displayed) Labs Reviewed  CBC WITH DIFFERENTIAL/PLATELET - Abnormal; Notable for the following components:      Result Value   RBC 2.89 (*)    Hemoglobin 8.6 (*)    HCT 25.9 (*)    RDW 16.8 (*)    All other components within normal limits  COMPREHENSIVE METABOLIC PANEL - Abnormal; Notable for the following components:   Sodium 132 (*)    Potassium 3.1 (*)    Glucose, Bld 201 (*)    BUN 57 (*)  Creatinine, Ser 2.42 (*)    Calcium 7.5 (*)    Total Protein 5.5 (*)    Albumin 2.0 (*)    AST 43 (*)    GFR, Estimated 28 (*)    All other components within normal limits  MAGNESIUM - Abnormal; Notable for the following components:   Magnesium 1.5 (*)    All other components within normal limits  BRAIN NATRIURETIC PEPTIDE - Abnormal; Notable for the following components:   B Natriuretic Peptide 544.9 (*)    All other components within normal limits  BLOOD GAS, VENOUS - Abnormal; Notable for the following components:   pCO2, Ven 40 (*)    All other components within normal limits  BLOOD GAS, VENOUS - Abnormal; Notable for the following components:   pCO2, Ven 41 (*)    All other components within normal limits  MAGNESIUM - Abnormal; Notable for the following components:   Magnesium 1.3 (*)    All other components within normal limits  URINALYSIS, COMPLETE (UACMP) WITH MICROSCOPIC - Abnormal; Notable for the following components:   Color, Urine AMBER (*)    APPearance CLOUDY (*)    Hgb urine dipstick LARGE (*)    Protein, ur >=300 (*)    Leukocytes,Ua LARGE (*)     RBC / HPF >50 (*)    WBC, UA >50 (*)    Bacteria, UA MANY (*)    All other components within normal limits  IRON AND TIBC - Abnormal; Notable for the following components:   Iron 15 (*)    TIBC 178 (*)    Saturation Ratios 8 (*)    All other components within normal limits  FERRITIN - Abnormal; Notable for the following components:   Ferritin 2,780 (*)    All other components within normal limits  RETICULOCYTES - Abnormal; Notable for the following components:   RBC. 2.71 (*)    All other components within normal limits  CBG MONITORING, ED - Abnormal; Notable for the following components:   Glucose-Capillary 184 (*)    All other components within normal limits  TROPONIN I (HIGH SENSITIVITY) - Abnormal; Notable for the following components:   Troponin I (High Sensitivity) 2,602 (*)    All other components within normal limits  RESP PANEL BY RT-PCR (FLU A&B, COVID) ARPGX2  CULTURE, BLOOD (ROUTINE X 2)  CULTURE, BLOOD (ROUTINE X 2)  RESPIRATORY PANEL BY PCR  EXPECTORATED SPUTUM ASSESSMENT W GRAM STAIN, RFLX TO RESP C  URINE CULTURE  MRSA NEXT GEN BY PCR, NASAL  LACTIC ACID, PLASMA  CK  PHOSPHORUS  TSH  PROTIME-INR  VITAMIN B12  PROCALCITONIN  PREALBUMIN  FOLATE  PROCALCITONIN  LEGIONELLA PNEUMOPHILA SEROGP 1 UR AG  STREP PNEUMONIAE URINARY ANTIGEN  CREATININE, SERUM  CBC  TROPONIN I (HIGH SENSITIVITY)    EKG EKG Interpretation  Date/Time:  Tuesday June 18 2022 16:20:18 EST Ventricular Rate:  97 PR Interval:  162 QRS Duration: 103 QT Interval:  376 QTC Calculation: 478 R Axis:   71 Text Interpretation: Sinus rhythm RSR' in V1 or V2, right VCD or RVH Borderline prolonged QT interval Confirmed by Cindee Lame [phone]) on 06/03/2022 5:23:46 PM  Radiology CT Chest Wo Contrast  Result Date: 06/17/2022 CLINICAL DATA:  Non-small cell lung cancer, assess treatment response. Ongoing immunotherapy. Chronic cough. Shortness of breath. * Tracking Code: BO * EXAM:  CT CHEST WITHOUT CONTRAST TECHNIQUE: Multidetector CT imaging of the chest was performed following the standard protocol without IV contrast. RADIATION DOSE REDUCTION: This  exam was performed according to the departmental dose-optimization program which includes automated exposure control, adjustment of the mA and/or kV according to patient size and/or use of iterative reconstruction technique. COMPARISON:  02/22/2022. FINDINGS: Cardiovascular: Right IJ Port-A-Cath terminates in the high right atrium. Atherosclerotic calcification of the aorta and aortic valve. Enlarged pulmonic trunk. Heart is at the upper limits of normal in size. No pericardial effusion. Mediastinum/Nodes: 1.4 cm low right paratracheal lymph node, increased from 9 mm. Additional mediastinal lymph nodes are not enlarged by CT size criteria. Hilar regions are difficult to evaluate without IV contrast. No axillary adenopathy. Esophagus is grossly unremarkable. Lungs/Pleura: Centrilobular and paraseptal emphysema. Thick-walled cavitary mass in the right lower lobe has decreased in size, now measuring 2.6 x 3.9 cm (5/74), previously approximately 5.3 x 7.5 cm. Interstitial thickening and nodularity in the right middle and right lower lobes have improved in the interval as well. New patchy ground-glass in the upper lobes and left lower lobe, with scattered consolidation in the right upper lobe, new. Cylindrical bronchiectasis. Small right effusion, increased. No left pleural fluid. Airway is otherwise unremarkable. Upper Abdomen: Visualized portion of the liver is grossly unremarkable. Stones in the gallbladder. Visualized portions of the adrenal glands and kidneys are unremarkable. Spleen measures at the upper limits of normal, 12.9 cm. Visualized portions of the pancreas, stomach and bowel are grossly unremarkable. Atherosclerotic calcification of the aorta. Renal vascular calcifications. No pathologically enlarged lymph nodes. Musculoskeletal:  Degenerative changes in the spine. No worrisome lytic or sclerotic lesions. IMPRESSION: 1. Interval response to therapy as evidenced by decrease in size of a cavitary mass in the right lower lobe. 2. Interval improvement in interstitial thickening and peribronchovascular nodularity in the right middle and right lower lobes. 3. New patchy ground-glass in the upper lobes and left lower lobe with associated areas of consolidation in the right upper lobe. Findings are likely infectious/inflammatory in etiology given the relatively short interval from 02/22/2022. 4. Enlarging low right paratracheal lymph node, possibly reactive. Recommend attention on follow-up. 5. Small right pleural effusion, increased. 6. Cylindrical bronchiectasis. 7. Cholelithiasis. 8.  Aortic atherosclerosis (ICD10-I70.0). 9. Enlarged pulmonic trunk, indicative of pulmonary arterial hypertension. 10.  Emphysema (ICD10-J43.9). Electronically Signed   By: Lorin Picket M.D.   On: 06/17/2022 09:04    Procedures Procedures  {Document cardiac monitor, telemetry assessment procedure when appropriate:1}  Medications Ordered in ED Medications  sodium chloride 0.9 % bolus 500 mL (has no administration in time range)  cefTRIAXone (ROCEPHIN) 1 g in sodium chloride 0.9 % 100 mL IVPB (has no administration in time range)  doxycycline (VIBRA-TABS) tablet 100 mg (has no administration in time range)    ED Course/ Medical Decision Making/ A&P                          Medical Decision Making Amount and/or Complexity of Data Reviewed Labs: ordered. Radiology: ordered.  Risk OTC drugs. Prescription drug management. Decision regarding hospitalization.   Patient is overall chronically ill-appearing, hypoxic to the 80s on RA now requiring 3L [place]. He is mildly tachypneic with mildly increased WOB.   DDX for dyspnea is most likely the pneumonia demonstrated on CT yesterday given cough/SOB/subjective fever, but DDX also includes but is not  limited to: Cardiac- CHF, Myocardial Ischemia, Valvular heart disease, Arrhythmia Respiratory - Pneumonia / pulmonary effusion / cavitary lung disease. Patient has BL breath sounds, lower c/f PTX. With CT demonstrating pneumonia yesterday, although patient is a cancer  patient, lower c/f PE and with no signs/symptoms of DVT, lower concern. Other - Sepsis, Anemia    Patient's lab demonstrate sodium 132, potassium 3.1, glucose 201, BUN 57, creatinine 2.42, calcium 7.5, needing 1.5.  Patient's magnesium, potassium, and calcium were repleted.  BNP 544.9.  pH 7.41, pCO2 40.  Lactate 0.9.  WBC 4.5, hemoglobin 8.6  I have personally reviewed and interpreted all labs and imaging.  Patient was maintained on a cardiac monitor.  I have personally interpreted the telemetry as Sinus tachycardia.     {Document critical care time when appropriate:1} {Document review of labs and clinical decision tools ie heart score, Chads2Vasc2 etc:1}  {Document your independent review of radiology images, and any outside records:1} {Document your discussion with family members, caretakers, and with consultants:1} {Document social determinants of health affecting pt's care:1} {Document your decision making why or why not admission, treatments were needed:1} Final Clinical Impression(s) / ED Diagnoses Final diagnoses:  None    Rx / DC Orders ED Discharge Orders     None        This note was created using dictation software, which may contain spelling or grammatical errors.

## 2022-06-18 NOTE — ED Provider Triage Note (Addendum)
Emergency Medicine Provider Triage Evaluation Note  Jorge Adams , a 71 y.o. male  was evaluated in triage.  Pt complains of shortness of breath and weakness for the last couple days.  States that he has just been very weak, and had multiple episodes of diarrhea.  Endorses increased shortness of breath, and had a CT scan done yesterday by his oncology PA that showed multilobar pneumonia.  Has been on doxycycline for 24 hours without improvement of symptoms.  Review of Systems  Positive: Shortness of breath, diarrhea Negative: Fever  Physical Exam  BP 106/72   Pulse (!) 102   Temp (!) 97.4 F (36.3 C) (Oral)   Resp (!) 23   SpO2 93%  Gen:   Awake, no distress   Resp:  Normal effort, diminished lung sounds MSK:   Moves extremities without difficulty  Other:    Medical Decision Making  Medically screening exam initiated at 4:24 PM.  Appropriate orders placed.  Jorge Adams was informed that the remainder of the evaluation will be completed by another provider, this initial triage assessment does not replace that evaluation, and the importance of remaining in the ED until their evaluation is complete.    Osvaldo Shipper, [place] 06/14/2022 1626   Patient began tripoding, O2 sats desatting to 82% on RA, bumped up to 4L with 88%. Informed RN, patient moved to room 10.    Osvaldo Shipper, [place] 06/08/2022 1638

## 2022-06-18 NOTE — Telephone Encounter (Signed)
Called and spoke with Juliann Pulse, pt's spouse.  She reports that pt starting not feeling well on Friday, cough and SOB.  Went for CT Scan Monday 06/05/2022 scheduled by oncologist that showed "possible infection/inflammation" PA prescribed Doxycyline.  Pt has taken 2 doses and reports no improvement in how he is feeling.  Wife reports that she has gotten him liquid IV and body armour drinks and that pt is drinking well but not feeling any better.  Would like to know if he should go to ED for IV fluids.

## 2022-06-18 NOTE — Assessment & Plan Note (Signed)
Continue Pletal And Crestor 20 mg p.o. daily

## 2022-06-18 NOTE — Telephone Encounter (Signed)
Pt in ED  Sent by PCP because he '' may be dehydrated".

## 2022-06-18 NOTE — Assessment & Plan Note (Signed)
Chronic but progressive will check anemia panel transfuse as needed

## 2022-06-18 NOTE — Assessment & Plan Note (Signed)
Emailed Dr. Earlie Server the patient is being admitted to the hospital

## 2022-06-18 NOTE — Assessment & Plan Note (Signed)
Replace and recheck

## 2022-06-18 NOTE — Assessment & Plan Note (Signed)
Continue aspirin Crestor 20 mg p.o. daily hold beta-blocker for tonight restart when able

## 2022-06-18 NOTE — ED Notes (Signed)
2 RNs attempted to draw blood cultures w/o success.  Phlebotomy unable to assist.

## 2022-06-18 NOTE — Subjective & Objective (Signed)
Patient has been having a cough for the past 4 days and shortness of breath on Monday the he had CT scan that showed possible inflammation patient has been taking doxycycline for 2 doses but not making any progress.  Has had decreased p.o. intake and feeling dehydrated Has been getting progressively weak cannot even walk for the past 2 days Has known history of stage III lung cancer and currently undergoing chemotherapy Of note last week he was told that he has UTI and was treated with unknown antibiotics as well

## 2022-06-18 NOTE — ED Notes (Signed)
Single set of blood cultures drawn from chest port.

## 2022-06-18 NOTE — Assessment & Plan Note (Signed)
-   currently appears to be slightly on the dry side, hold home diuretics for tonight and restart when appears euvolemic, carefuly follow fluid status and Cr

## 2022-06-18 NOTE — Assessment & Plan Note (Signed)
Continue Crestor 20 mg a day and Tricor 145 mg a day

## 2022-06-18 NOTE — ED Provider Notes (Signed)
[facility] DEPT Provider Note   CSN: [id number] Arrival date & time: 05/30/2022  1346     History {Add pertinent medical, surgical, social history, OB history to HPI:1} Chief Complaint  Patient presents with   Shortness of Breath    NYEEM STOKE is a 71 y.o. male with squamous of carcinoma of right lung stage III, HTN, HLD, obesity, CAD, T2DM, PAD, gout, dysphagia, hypomagnesemia/hypokalemia, hypocalcemia, diarrhea, cervical spine stenosis with myelopathy who presents with shortness of breath.    Pt complains of shortness of breath and weakness for approximately one week.  States that he has just been very weak, and had multiple episodes of diarrhea.  Endorses increased shortness of breath, worse with exertion, and even with rolling over in bed. He had a CT chest w/o contrast performed yesterday as part of routine cancer surveillance and was called to say that showed multilobar pneumonia.  Has been on doxycycline for 24 hours, taken two doses, without improvement of symptoms. Also was told last week that he had a UTI and was treated with 2 doses of powder per wife.    Shortness of Breath      Home Medications Prior to Admission medications   Medication Sig Start Date End Date Taking? Authorizing Provider  albuterol (VENTOLIN HFA) 108 (90 Base) MCG/ACT inhaler Inhale 1-2 puffs into the lungs every 6 (six) hours as needed (for cough.  okay to fill with albuterol/proair/ventolin.). 11/06/21   [name], MD  aspirin EC 81 MG tablet Take 1 tablet (81 mg total) by mouth daily. Okay to restart this medication on 12/05/2021. 12/04/21   Collene Gobble, MD  brimonidine (ALPHAGAN P) 0.1 % SOLN Place 2 drops into both eyes 2 (two) times daily.    [provider]  cilostazol (PLETAL) 100 MG tablet TAKE 1 TABLET BY MOUTH TWICE A DAY 04/17/22   [name], MD  Cyanocobalamin (VITAMIN B12 PO) Take 1 tablet by mouth in the morning and at bedtime.     [provider]  dorzolamide-timolol (COSOPT) 22.3-6.8 MG/ML ophthalmic solution Place 1 drop into both eyes 2 (two) times daily.    [provider]  doxycycline (VIBRA-TABS) 100 MG tablet Take 1 tablet (100 mg total) by mouth 2 (two) times daily. 06/17/22   Heilingoetter, [name] L, PA-C  FeFum-FePoly-FA-B Cmp-C-Biot (FOLIVANE-PLUS) CAPS TAKE 1 CAPSULE BY MOUTH EVERY DAY IN THE MORNING 05/31/22   Heilingoetter, [name] L, PA-C  fenofibrate (TRICOR) 145 MG tablet TAKE 1 TABLET BY MOUTH EVERY DAY IN THE EVENING Patient taking differently: Take 145 mg by mouth daily. 01/03/22   Adrian Prows, MD  HYDROcodone bit-homatropine (HYCODAN) 5-1.5 MG/5ML syrup Take 5 mLs by mouth every 8 (eight) hours as needed for cough (sedation caution.). 06/04/22   [name], MD  ipratropium-albuterol (DUONEB) 0.5-2.5 (3) MG/3ML SOLN Take 3 mLs by nebulization every 6 (six) hours as needed. 02/25/22   Oswald Hillock, MD  levothyroxine (SYNTHROID) 50 MCG tablet TAKE 1 TABLET BY MOUTH EVERY DAY BEFORE BREAKFAST Patient taking differently: Take 50 mcg by mouth daily before breakfast. 05/15/22   Curt Bears, MD  Magnesium Oxide 250 MG TABS Take 1 tablet (250 mg total) by mouth daily. 05/05/22   [name], MD  metoprolol tartrate (LOPRESSOR) 25 MG tablet Take 25 mg by mouth 2 (two) times daily. 05/20/22   [provider]  mirtazapine (REMERON) 7.5 MG tablet TAKE 1 TABLET BY MOUTH AT BEDTIME. 05/26/22   Elsie Stain  S, MD  Netarsudil-Latanoprost (ROCKLATAN) 0.02-0.005 % SOLN Apply 1 drop to eye at bedtime. In both eyes    [provider]  nitroGLYCERIN (NITROSTAT) 0.4 MG SL tablet Place 1 tablet (0.4 mg total) under the tongue every 5 (five) minutes as needed for chest pain. 11/06/21   [name], MD  omega-3 acid ethyl esters (LOVAZA) 1 g capsule TAKE 1 CAPSULE BY MOUTH TWICE A DAY Patient taking differently: Take 1 g by mouth 2 (two) times daily. 01/03/22   Adrian Prows,  MD  potassium chloride 20 MEQ/15ML (10%) SOLN TAKE 30 MLS (40 MEQ TOTAL) BY MOUTH 2 (TWO) TIMES DAILY. 05/08/22   [name], MD  predniSONE (DELTASONE) 20 MG tablet Take 2 a day for 5 days, then 1 a day for 5 days, with food. Don't take with aleve/ibuprofen. Patient not taking: Reported on 05/20/2022 05/10/22   [name], MD  prochlorperazine (COMPAZINE) 10 MG tablet TAKE 1 TABLET BY MOUTH EVERY 6 HOURS AS NEEDED FOR NAUSEA OR VOMITING. Patient taking differently: Take 10 mg by mouth every 6 (six) hours as needed for nausea or vomiting. 05/10/22   Heilingoetter, [name] L, PA-C  rosuvastatin (CRESTOR) 20 MG tablet TAKE 1 TABLET BY MOUTH EVERY DAY Patient taking differently: Take 20 mg by mouth daily. 04/17/22   Adrian Prows, MD  torsemide (DEMADEX) 20 MG tablet Take 2 tablets (40 mg total) by mouth 2 (two) times daily. 05/02/22   [name], MD  triamcinolone cream (KENALOG) 0.1 % Apply 1 Application topically 2 (two) times daily. 05/02/22   [name], MD      Allergies    Metformin and related and Budesonide    Review of Systems   Review of Systems  Respiratory:  Positive for shortness of breath.    Review of systems positive/negative for ***.  A 10 point review of systems was performed and is negative unless otherwise reported in HPI.  Physical Exam Updated Vital Signs BP 106/72   Pulse (!) 102   Temp (!) 97.4 F (36.3 C) (Oral)   Resp (!) 23   SpO2 (!) 88%  Physical Exam General: Normal appearing ***male/male, lying in bed.  HEENT: PERRLA, Sclera anicteric, MMM, trachea midline. Cardiology: RRR, no murmurs/rubs/gallops. BL radial and DP pulses equal bilaterally.  Resp: Normal respiratory rate and effort. CTAB, no wheezes, rhonchi, crackles.  Abd: Soft, non-tender, non-distended. No rebound tenderness or guarding.  GU: Deferred. MSK: No peripheral edema or signs of trauma. Extremities without deformity or TTP. No cyanosis or clubbing. Skin: warm,  dry. No rashes or lesions. Back: No CVA tenderness Neuro: A&Ox4, CNs II-XII grossly intact. MAEs. Sensation grossly intact.  Psych: Normal mood and affect.   ED Results / Procedures / Treatments   Labs (all labs ordered are listed, but only abnormal results are displayed) Labs Reviewed  CULTURE, BLOOD (ROUTINE X 2)  CULTURE, BLOOD (ROUTINE X 2)  RESP PANEL BY RT-PCR (FLU A&B, COVID) [id number]  CBC WITH DIFFERENTIAL/PLATELET  COMPREHENSIVE METABOLIC PANEL  MAGNESIUM    EKG EKG Interpretation  Date/Time:  Tuesday June 18 2022 16:20:18 EST Ventricular Rate:  97 PR Interval:  162 QRS Duration: 103 QT Interval:  376 QTC Calculation: 478 R Axis:   71 Text Interpretation: Sinus rhythm RSR' in V1 or V2, right VCD or RVH Borderline prolonged QT interval Confirmed by Cindee Lame [phone]) on 05/29/2022 5:23:46 PM  Radiology CT Chest Wo Contrast  Result Date: 06/17/2022 CLINICAL DATA:  Non-small cell  lung cancer, assess treatment response. Ongoing immunotherapy. Chronic cough. Shortness of breath. * Tracking Code: BO * EXAM: CT CHEST WITHOUT CONTRAST TECHNIQUE: Multidetector CT imaging of the chest was performed following the standard protocol without IV contrast. RADIATION DOSE REDUCTION: This exam was performed according to the departmental dose-optimization program which includes automated exposure control, adjustment of the mA and/or kV according to patient size and/or use of iterative reconstruction technique. COMPARISON:  02/22/2022. FINDINGS: Cardiovascular: Right IJ Port-A-Cath terminates in the high right atrium. Atherosclerotic calcification of the aorta and aortic valve. Enlarged pulmonic trunk. Heart is at the upper limits of normal in size. No pericardial effusion. Mediastinum/Nodes: 1.4 cm low right paratracheal lymph node, increased from 9 mm. Additional mediastinal lymph nodes are not enlarged by CT size criteria. Hilar regions are difficult to evaluate without IV contrast. No  axillary adenopathy. Esophagus is grossly unremarkable. Lungs/Pleura: Centrilobular and paraseptal emphysema. Thick-walled cavitary mass in the right lower lobe has decreased in size, now measuring 2.6 x 3.9 cm (5/74), previously approximately 5.3 x 7.5 cm. Interstitial thickening and nodularity in the right middle and right lower lobes have improved in the interval as well. New patchy ground-glass in the upper lobes and left lower lobe, with scattered consolidation in the right upper lobe, new. Cylindrical bronchiectasis. Small right effusion, increased. No left pleural fluid. Airway is otherwise unremarkable. Upper Abdomen: Visualized portion of the liver is grossly unremarkable. Stones in the gallbladder. Visualized portions of the adrenal glands and kidneys are unremarkable. Spleen measures at the upper limits of normal, 12.9 cm. Visualized portions of the pancreas, stomach and bowel are grossly unremarkable. Atherosclerotic calcification of the aorta. Renal vascular calcifications. No pathologically enlarged lymph nodes. Musculoskeletal: Degenerative changes in the spine. No worrisome lytic or sclerotic lesions. IMPRESSION: 1. Interval response to therapy as evidenced by decrease in size of a cavitary mass in the right lower lobe. 2. Interval improvement in interstitial thickening and peribronchovascular nodularity in the right middle and right lower lobes. 3. New patchy ground-glass in the upper lobes and left lower lobe with associated areas of consolidation in the right upper lobe. Findings are likely infectious/inflammatory in etiology given the relatively short interval from 02/22/2022. 4. Enlarging low right paratracheal lymph node, possibly reactive. Recommend attention on follow-up. 5. Small right pleural effusion, increased. 6. Cylindrical bronchiectasis. 7. Cholelithiasis. 8.  Aortic atherosclerosis (ICD10-I70.0). 9. Enlarged pulmonic trunk, indicative of pulmonary arterial hypertension. 10.   Emphysema (ICD10-J43.9). Electronically Signed   By: Lorin Picket M.D.   On: 06/17/2022 09:04    Procedures Procedures  {Document cardiac monitor, telemetry assessment procedure when appropriate:1}  Medications Ordered in ED Medications  sodium chloride 0.9 % bolus 500 mL (has no administration in time range)  cefTRIAXone (ROCEPHIN) 1 g in sodium chloride 0.9 % 100 mL IVPB (has no administration in time range)  doxycycline (VIBRA-TABS) tablet 100 mg (has no administration in time range)    ED Course/ Medical Decision Making/ A&P                          Medical Decision Making Amount and/or Complexity of Data Reviewed Labs: ordered.  Risk Decision regarding hospitalization.   Patient is overall chronically ill-appearing  DDX for dyspnea includes but is not limited to:  Cardiac- CHF, Myocardial Ischemia, Valvular heart disease, Arrythmia, Cardiac tamponade   Respiratory - Pneumonia / atelectasis / pulmonary effusion / cavitary lung disease, Pneumothorax, COPD/ reactive airway disease, PE, ARDS   Other -  Sepsis, Anemia      I have personally reviewed and interpreted all labs and imaging.  Patient was maintained on a cardiac monitor.  I have personally interpreted the telemetry as ***     {Document critical care time when appropriate:1} {Document review of labs and clinical decision tools ie heart score, Chads2Vasc2 etc:1}  {Document your independent review of radiology images, and any outside records:1} {Document your discussion with family members, caretakers, and with consultants:1} {Document social determinants of health affecting pt's care:1} {Document your decision making why or why not admission, treatments were needed:1} Final Clinical Impression(s) / ED Diagnoses Final diagnoses:  None    Rx / DC Orders ED Discharge Orders     None        This note was created using dictation software, which may contain spelling or grammatical errors.

## 2022-06-19 ENCOUNTER — Other Ambulatory Visit: Payer: Medicare HMO

## 2022-06-19 ENCOUNTER — Inpatient Hospital Stay (HOSPITAL_COMMUNITY): Payer: Medicare HMO

## 2022-06-19 ENCOUNTER — Ambulatory Visit: Payer: Medicare HMO

## 2022-06-19 ENCOUNTER — Ambulatory Visit: Payer: Medicare HMO | Admitting: Internal Medicine

## 2022-06-19 ENCOUNTER — Encounter: Payer: Medicare HMO | Admitting: Dietician

## 2022-06-19 DIAGNOSIS — N390 Urinary tract infection, site not specified: Secondary | ICD-10-CM | POA: Diagnosis present

## 2022-06-19 DIAGNOSIS — E1159 Type 2 diabetes mellitus with other circulatory complications: Secondary | ICD-10-CM | POA: Diagnosis not present

## 2022-06-19 DIAGNOSIS — J189 Pneumonia, unspecified organism: Secondary | ICD-10-CM | POA: Diagnosis not present

## 2022-06-19 DIAGNOSIS — R9431 Abnormal electrocardiogram [ECG] [EKG]: Secondary | ICD-10-CM

## 2022-06-19 DIAGNOSIS — N184 Chronic kidney disease, stage 4 (severe): Secondary | ICD-10-CM

## 2022-06-19 DIAGNOSIS — I82451 Acute embolism and thrombosis of right peroneal vein: Secondary | ICD-10-CM

## 2022-06-19 DIAGNOSIS — M7989 Other specified soft tissue disorders: Secondary | ICD-10-CM

## 2022-06-19 DIAGNOSIS — I82432 Acute embolism and thrombosis of left popliteal vein: Secondary | ICD-10-CM

## 2022-06-19 DIAGNOSIS — R7989 Other specified abnormal findings of blood chemistry: Secondary | ICD-10-CM

## 2022-06-19 DIAGNOSIS — C3491 Malignant neoplasm of unspecified part of right bronchus or lung: Secondary | ICD-10-CM | POA: Diagnosis not present

## 2022-06-19 DIAGNOSIS — R748 Abnormal levels of other serum enzymes: Secondary | ICD-10-CM

## 2022-06-19 DIAGNOSIS — J9601 Acute respiratory failure with hypoxia: Secondary | ICD-10-CM | POA: Diagnosis not present

## 2022-06-19 LAB — RESPIRATORY PANEL BY PCR

## 2022-06-19 LAB — CBG MONITORING, ED
Glucose-Capillary: 62 mg/dL — ABNORMAL LOW (ref 70–99)
Glucose-Capillary: 74 mg/dL (ref 70–99)
Glucose-Capillary: 81 mg/dL (ref 70–99)
Glucose-Capillary: 67 mg/dL — ABNORMAL LOW (ref 70–99)
Glucose-Capillary: 158 mg/dL — ABNORMAL HIGH (ref 70–99)
Glucose-Capillary: 262 mg/dL — ABNORMAL HIGH (ref 70–99)
Glucose-Capillary: 340 mg/dL — ABNORMAL HIGH (ref 70–99)

## 2022-06-19 LAB — COMPREHENSIVE METABOLIC PANEL
ALT: 20 U/L (ref 0–44)
AST: 54 U/L — ABNORMAL HIGH (ref 15–41)
Albumin: 1.7 g/dL — ABNORMAL LOW (ref 3.5–5.0)
Alkaline Phosphatase: 51 U/L (ref 38–126)
Anion gap: 12 (ref 5–15)
BUN: 51 mg/dL — ABNORMAL HIGH (ref 8–23)
CO2: 21 mmol/L — ABNORMAL LOW (ref 22–32)
Calcium: 7.6 mg/dL — ABNORMAL LOW (ref 8.9–10.3)
Chloride: 104 mmol/L (ref 98–111)
Creatinine, Ser: 2.25 mg/dL — ABNORMAL HIGH (ref 0.61–1.24)
GFR, Estimated: 30 mL/min — ABNORMAL LOW (ref >60)
Glucose, Bld: 101 mg/dL — ABNORMAL HIGH (ref 70–99)
Potassium: 3.3 mmol/L — ABNORMAL LOW (ref 3.5–5.1)
Sodium: 137 mmol/L (ref 135–145)
Total Bilirubin: 0.7 mg/dL (ref 0.3–1.2)
Total Protein: 5.2 g/dL — ABNORMAL LOW (ref 6.5–8.1)

## 2022-06-19 LAB — ECHOCARDIOGRAM COMPLETE
AR max vel: 3 cm2
AR max vel: 3 cm2
AV Area VTI: 2.47 cm2
AV Area VTI: 2.47 cm2
AV Area mean vel: 2.73 cm2
AV Area mean vel: 2.73 cm2
AV Mean grad: 4 mmHg
AV Mean grad: 4 mmHg
AV Peak grad: 7.1 mmHg
AV Peak grad: 7.1 mmHg
Ao pk vel: 1.33 m/s
Ao pk vel: 1.33 m/s
Area-P 1/2: 5.06 cm2
Area-P 1/2: 5.06 cm2
Calc EF: 54.6 %
Calc EF: 54.6 %
S' Lateral: 2.7 cm
S' Lateral: 2.7 cm
Single Plane A2C EF: 58 %
Single Plane A2C EF: 58 %
Single Plane A4C EF: 51.7 %
Single Plane A4C EF: 51.7 %
Weight: 2507.95 oz

## 2022-06-19 LAB — CREATININE, SERUM
Creatinine, Ser: 2.27 mg/dL — ABNORMAL HIGH (ref 0.61–1.24)
GFR, Estimated: 30 mL/min — ABNORMAL LOW (ref >60)

## 2022-06-19 LAB — HEPARIN LEVEL (UNFRACTIONATED)
Heparin Unfractionated: <0.1 IU/mL — ABNORMAL LOW (ref 0.30–0.70)
Heparin Unfractionated: <0.1 IU/mL — ABNORMAL LOW (ref 0.30–0.70)

## 2022-06-19 LAB — CBC
HCT: 26.2 % — ABNORMAL LOW (ref 39.0–52.0)
HCT: 27.7 % — ABNORMAL LOW (ref 39.0–52.0)
Hemoglobin: 8.4 g/dL — ABNORMAL LOW (ref 13.0–17.0)
Hemoglobin: 9 g/dL — ABNORMAL LOW (ref 13.0–17.0)
MCH: 29.1 pg (ref 26.0–34.0)
MCH: 29.2 pg (ref 26.0–34.0)
MCHC: 32.1 g/dL (ref 30.0–36.0)
MCHC: 32.5 g/dL (ref 30.0–36.0)
MCV: 90.7 fL (ref 80.0–100.0)
MCV: 89.9 fL (ref 80.0–100.0)
Platelets: 193 10*3/uL (ref 150–400)
Platelets: 204 10*3/uL (ref 150–400)
RBC: 2.89 MIL/uL — ABNORMAL LOW (ref 4.22–5.81)
RBC: 3.08 MIL/uL — ABNORMAL LOW (ref 4.22–5.81)
RDW: 16.7 % — ABNORMAL HIGH (ref 11.5–15.5)
RDW: 16.9 % — ABNORMAL HIGH (ref 11.5–15.5)
WBC: 5.4 10*3/uL (ref 4.0–10.5)
WBC: 5.5 10*3/uL (ref 4.0–10.5)
nRBC: 0 % (ref 0.0–0.2)
nRBC: 0 % (ref 0.0–0.2)

## 2022-06-19 LAB — PROCALCITONIN: Procalcitonin: 0.53 ng/mL

## 2022-06-19 LAB — TROPONIN I (HIGH SENSITIVITY)
Troponin I (High Sensitivity): 2762 ng/L (ref <18)
Troponin I (High Sensitivity): 2990 ng/L (ref <18)
Troponin I (High Sensitivity): 2574 ng/L (ref <18)
Troponin I (High Sensitivity): 2623 ng/L (ref <18)

## 2022-06-19 LAB — MRSA NEXT GEN BY PCR, NASAL
MRSA by PCR Next Gen: NOT DETECTED
MRSA by PCR Next Gen: NOT DETECTED

## 2022-06-19 LAB — MAGNESIUM: Magnesium: 1.9 mg/dL (ref 1.7–2.4)

## 2022-06-19 LAB — PHOSPHORUS: Phosphorus: 3.9 mg/dL (ref 2.5–4.6)

## 2022-06-19 LAB — SEDIMENTATION RATE: Sed Rate: 40 mm/hr — ABNORMAL HIGH (ref 0–16)

## 2022-06-19 LAB — STREP PNEUMONIAE URINARY ANTIGEN: Strep Pneumo Urinary Antigen: NEGATIVE

## 2022-06-19 LAB — PREALBUMIN: Prealbumin: 14 mg/dL — ABNORMAL LOW (ref 18–38)

## 2022-06-19 MED ORDER — PANTOPRAZOLE SODIUM 40 MG PO TBEC
40.0000 mg | DELAYED_RELEASE_TABLET | Freq: Every day | ORAL | Status: DC
  Administered 2022-06-19 – 2022-06-22 (×4): 40 mg via ORAL
  Filled 2022-06-19 (×5): qty 1

## 2022-06-19 MED ORDER — SODIUM CHLORIDE 0.9% FLUSH
10.0000 mL | Freq: Two times a day (BID) | INTRAVENOUS | Status: DC
  Administered 2022-06-19 – 2022-06-21 (×5): 10 mL
  Administered 2022-06-21: 20 mL
  Administered 2022-06-22: 10 mL
  Administered 2022-06-22: 20 mL
  Administered 2022-06-23 – 2022-06-25 (×6): 10 mL

## 2022-06-19 MED ORDER — METOPROLOL SUCCINATE ER 25 MG PO TB24
25.0000 mg | ORAL_TABLET | Freq: Every day | ORAL | Status: DC
  Administered 2022-06-19 – 2022-06-22 (×4): 25 mg via ORAL
  Filled 2022-06-19 (×4): qty 1

## 2022-06-19 MED ORDER — POTASSIUM CHLORIDE CRYS ER 10 MEQ PO TBCR
10.0000 meq | EXTENDED_RELEASE_TABLET | ORAL | Status: AC
  Administered 2022-06-19: 10 meq via ORAL
  Filled 2022-06-19: qty 1

## 2022-06-19 MED ORDER — ASPIRIN 81 MG PO TBEC
81.0000 mg | DELAYED_RELEASE_TABLET | Freq: Every day | ORAL | Status: DC
  Administered 2022-06-19 – 2022-06-22 (×4): 81 mg via ORAL
  Filled 2022-06-19 (×5): qty 1

## 2022-06-19 MED ORDER — ROSUVASTATIN CALCIUM 20 MG PO TABS
20.0000 mg | ORAL_TABLET | Freq: Every day | ORAL | Status: DC
  Administered 2022-06-19 – 2022-06-25 (×7): 20 mg via ORAL
  Filled 2022-06-19 (×7): qty 1

## 2022-06-19 MED ORDER — FENOFIBRATE 160 MG PO TABS
160.0000 mg | ORAL_TABLET | Freq: Every day | ORAL | Status: DC
  Administered 2022-06-19 – 2022-06-25 (×7): 160 mg via ORAL
  Filled 2022-06-19 (×7): qty 1

## 2022-06-19 MED ORDER — POTASSIUM CHLORIDE 40 MEQ/15ML (20%) PO SOLN
10.0000 meq | Freq: Two times a day (BID) | ORAL | Status: DC
  Filled 2022-06-19: qty 1

## 2022-06-19 MED ORDER — MIRTAZAPINE 15 MG PO TABS
7.5000 mg | ORAL_TABLET | Freq: Every day | ORAL | Status: DC
  Administered 2022-06-19 – 2022-06-21 (×3): 7.5 mg via ORAL
  Filled 2022-06-19 (×3): qty 1

## 2022-06-19 MED ORDER — MAGNESIUM CHLORIDE 64 MG PO TBEC
1.0000 | DELAYED_RELEASE_TABLET | Freq: Every day | ORAL | Status: DC
  Administered 2022-06-19 – 2022-06-20 (×2): 64 mg via ORAL
  Filled 2022-06-19 (×5): qty 1

## 2022-06-19 MED ORDER — ISOSORB DINITRATE-HYDRALAZINE 20-37.5 MG PO TABS
1.0000 | ORAL_TABLET | Freq: Three times a day (TID) | ORAL | Status: DC
  Administered 2022-06-19 – 2022-06-22 (×8): 1 via ORAL
  Filled 2022-06-19 (×13): qty 1

## 2022-06-19 MED ORDER — BRIMONIDINE TARTRATE 0.15 % OP SOLN
2.0000 [drp] | Freq: Two times a day (BID) | OPHTHALMIC | Status: DC
  Administered 2022-06-19 – 2022-06-26 (×14): 2 [drp] via OPHTHALMIC
  Filled 2022-06-19: qty 5

## 2022-06-19 MED ORDER — LEVOTHYROXINE SODIUM 50 MCG PO TABS
50.0000 ug | ORAL_TABLET | Freq: Every day | ORAL | Status: DC
  Administered 2022-06-19 – 2022-06-23 (×5): 50 ug via ORAL
  Filled 2022-06-19 (×5): qty 1

## 2022-06-19 MED ORDER — IPRATROPIUM-ALBUTEROL 0.5-2.5 (3) MG/3ML IN SOLN
3.0000 mL | Freq: Four times a day (QID) | RESPIRATORY_TRACT | Status: DC
  Administered 2022-06-19 – 2022-06-26 (×28): 3 mL via RESPIRATORY_TRACT
  Filled 2022-06-19 (×4): qty 3
  Filled 2022-06-19: qty 42
  Filled 2022-06-19 (×23): qty 3

## 2022-06-19 MED ORDER — CILOSTAZOL 100 MG PO TABS
100.0000 mg | ORAL_TABLET | Freq: Two times a day (BID) | ORAL | Status: DC
  Administered 2022-06-19 – 2022-06-22 (×7): 100 mg via ORAL
  Filled 2022-06-19 (×8): qty 1

## 2022-06-19 MED ORDER — POTASSIUM CHLORIDE CRYS ER 10 MEQ PO TBCR: 10.0000 meq | EXTENDED_RELEASE_TABLET | Freq: Two times a day (BID) | ORAL | Status: DC

## 2022-06-19 MED ORDER — METHYLPREDNISOLONE SODIUM SUCC 125 MG IJ SOLR
125.0000 mg | Freq: Every day | INTRAMUSCULAR | Status: DC
  Administered 2022-06-19 – 2022-06-26 (×8): 125 mg via INTRAVENOUS
  Filled 2022-06-19 (×9): qty 2

## 2022-06-19 MED ORDER — SODIUM CHLORIDE 0.9% FLUSH: 10.0000 mL | INTRAVENOUS | Status: DC | PRN

## 2022-06-19 MED ORDER — HEPARIN NICU/PEDS BOLUS VIA INFUSION
4000.0000 [IU] | Freq: Once | INTRAVENOUS | Status: AC
  Administered 2022-06-19: 4000 [IU] via INTRAVENOUS
  Filled 2022-06-19: qty 4000

## 2022-06-19 MED ORDER — POTASSIUM CHLORIDE 20 MEQ/15ML (10%) PO SOLN
10.0000 meq | Freq: Two times a day (BID) | ORAL | Status: DC
  Administered 2022-06-19 – 2022-06-22 (×5): 10 meq via ORAL
  Filled 2022-06-19 (×8): qty 15

## 2022-06-19 MED ORDER — CHLORHEXIDINE GLUCONATE CLOTH 2 % EX PADS
6.0000 | MEDICATED_PAD | Freq: Every day | CUTANEOUS | Status: DC
  Administered 2022-06-19 – 2022-06-26 (×8): 6 via TOPICAL

## 2022-06-19 MED ORDER — ORAL CARE MOUTH RINSE: 15.0000 mL | OROMUCOSAL | Status: DC | PRN

## 2022-06-19 MED ORDER — MAGNESIUM SULFATE IN D5W 1-5 GM/100ML-% IV SOLN
1.0000 g | Freq: Once | INTRAVENOUS | Status: AC
  Administered 2022-06-19: 1 g via INTRAVENOUS
  Filled 2022-06-19: qty 100

## 2022-06-19 MED ORDER — TORSEMIDE 20 MG PO TABS
20.0000 mg | ORAL_TABLET | Freq: Two times a day (BID) | ORAL | Status: DC
  Administered 2022-06-19 – 2022-06-23 (×7): 20 mg via ORAL
  Filled 2022-06-19 (×10): qty 1

## 2022-06-19 MED ORDER — PERFLUTREN LIPID MICROSPHERE
1.0000 mL | INTRAVENOUS | Status: AC | PRN
  Administered 2022-06-19: 2 mL via INTRAVENOUS

## 2022-06-19 MED ORDER — IPRATROPIUM-ALBUTEROL 0.5-2.5 (3) MG/3ML IN SOLN
3.0000 mL | RESPIRATORY_TRACT | Status: DC | PRN
  Administered 2022-06-19: 3 mL via RESPIRATORY_TRACT
  Filled 2022-06-19: qty 3

## 2022-06-19 MED ORDER — POTASSIUM CHLORIDE 20 MEQ/15ML (10%) PO SOLN
10.0000 meq | ORAL | Status: AC
  Administered 2022-06-19 (×2): 10 meq via ORAL
  Filled 2022-06-19 (×2): qty 1

## 2022-06-19 MED ORDER — HEPARIN BOLUS VIA INFUSION
2000.0000 [IU] | Freq: Once | INTRAVENOUS | Status: AC
  Administered 2022-06-19: 2000 [IU] via INTRAVENOUS
  Filled 2022-06-19: qty 2000

## 2022-06-19 MED ORDER — HEPARIN (PORCINE) 25000 UT/250ML-% IV SOLN
1650.0000 [IU]/h | INTRAVENOUS | Status: DC
  Administered 2022-06-20: 1450 [IU]/h via INTRAVENOUS
  Administered 2022-06-21 – 2022-06-26 (×9): 1650 [IU]/h via INTRAVENOUS
  Filled 2022-06-19 (×9): qty 250

## 2022-06-19 NOTE — Progress Notes (Signed)
Jorge Adams   DOB:[birth year]   ZO#:[id number]   JWJ#:[id number]  Oncology follow up   Subjective: Patient is well-known to our service (his wife is my patient), he is under my partner Dr. Worthy Flank care for his stage III lung cancer.  Chart reviewed.  He presented to emergency room for worsening shortness of breath, which has been ongoing for a few weeks.  He denies any fever, or chills at home.  He does have productive cough with clear mucus.  No hemoptysis.  He is on high flow oxygen, alert, oriented, was able to talk.  I spoke with his wife Jorge Adams also.   Objective:  Vitals:   06/19/22 1530 06/19/22 1600  BP:  123/61  Adams: 100 (!) 111  Resp: (!) 21 (!) 26  Temp:  98.6 F (37 C)  SpO2: 98% 97%    Body mass index is 22.49 kg/m.  Intake/Output Summary (Last 24 hours) at 06/19/2022 1802 Last data filed at 06/19/2022 1641 Gross per 24 hour  Intake 2006.03 ml  Output --  Net 2006.03 ml     Sclerae unicteric  Oropharynx clear  No peripheral adenopathy  Lungs (+) diffuse rhonchi bilateral, more on the right  Heart regular rate and rhythm  Abdomen benign  MSK no focal spinal tenderness, no peripheral edema  Neuro nonfocal    CBG (last 3)  Recent Labs    06/19/22 0803 06/19/22 1202 06/19/22 1615  GLUCAP 67* 158* 262*     Labs:  Urine Studies No results for input(s): "UHGB", "CRYS" in the last 72 hours.  Invalid input(s): "UACOL", "UAPR", "USPG", "UPH", "UTP", "UGL", "UKET", "UBIL", "UNIT", "UROB", "ULEU", "UEPI", "UWBC", "URBC", "UBAC", "CAST", "UCOM", "BILUA"  Basic Metabolic Panel: Recent Labs  Lab 06/10/2022 1705 05/27/2022 2116 06/19/22 0515 06/19/22 0825  NA 132*  --   --  137  K 3.1*  --   --  3.3*  CL 100  --   --  104  CO2 23  --   --  21*  GLUCOSE 201*  --   --  101*  BUN 57*  --   --  51*  CREATININE 2.42*  --  2.27* 2.25*  CALCIUM 7.5*  --   --  7.6*  MG 1.5* 1.3* 1.9  --   PHOS 4.3  --  3.9  --    GFR Estimated Creatinine Clearance: 29.4  mL/min (A) (by C-G formula based on SCr of 2.25 mg/dL (H)). Liver Function Tests: Recent Labs  Lab 06/10/2022 1705 06/19/22 0825  AST 43* 54*  ALT 21 20  ALKPHOS 52 51  BILITOT 0.8 0.7  PROT 5.5* 5.2*  ALBUMIN 2.0* 1.7*   No results for input(s): "LIPASE", "AMYLASE" in the last 168 hours. No results for input(s): "AMMONIA" in the last 168 hours. Coagulation profile Recent Labs  Lab 05/30/2022 1705  INR 1.2    CBC: Recent Labs  Lab 06/11/2022 1705 06/19/22 0515 06/19/22 0825  WBC 4.5 5.4 5.5  NEUTROABS 3.7  --   --   HGB 8.6* 8.4* 9.0*  HCT 25.9* 26.2* 27.7*  MCV 89.6 90.7 89.9  PLT 190 193 204   Cardiac Enzymes: Recent Labs  Lab 06/04/2022 1705  CKTOTAL 63   BNP: Invalid input(s): "POCBNP" CBG: Recent Labs  Lab 06/19/22 0539 06/19/22 0609 06/19/22 0803 06/19/22 1202 06/19/22 1615  GLUCAP 74 81 67* 158* 262*   D-Dimer No results for input(s): "DDIMER" in the last 72 hours. Hgb A1c No results for input(s): "  HGBA1C" in the last 72 hours. Lipid Profile No results for input(s): "CHOL", "HDL", "LDLCALC", "TRIG", "CHOLHDL", "LDLDIRECT" in the last 72 hours. Thyroid function studies Recent Labs    06/08/2022 2116  TSH 1.560   Anemia work up Recent Labs    06/12/2022 2116  VITAMINB12 607  FOLATE 32.9  FERRITIN 2,780*  TIBC 178*  IRON 15*  RETICCTPCT 1.6   Microbiology Recent Results (from the past 240 hour(s))  Resp Panel by RT-PCR (Flu A&B, Covid) Porta Cath     Status: None   Collection Time: 06/14/2022  5:44 PM   Specimen: Porta Cath; Nasal Swab  Result Value Ref Range Status   SARS Coronavirus 2 by RT PCR NEGATIVE NEGATIVE Final    Comment: (NOTE) SARS-CoV-2 target nucleic acids are NOT DETECTED.  The SARS-CoV-2 RNA is generally detectable in upper respiratory specimens during the acute phase of infection. The lowest concentration of SARS-CoV-2 viral copies this assay can detect is 138 copies/mL. A negative result does not preclude  SARS-Cov-2 infection and should not be used as the sole basis for treatment or other patient management decisions. A negative result may occur with  improper specimen collection/handling, submission of specimen other than nasopharyngeal swab, presence of viral mutation(s) within the areas targeted by this assay, and inadequate number of viral copies(<138 copies/mL). A negative result must be combined with clinical observations, patient history, and epidemiological information. The expected result is Negative.  Fact Sheet for Patients:  EntrepreneurPulse.com.au  Fact Sheet for Healthcare Providers:  IncredibleEmployment.be  This test is no t yet approved or cleared by the [place] FDA and  has been authorized for detection and/or diagnosis of SARS-CoV-2 by FDA under an Emergency Use Authorization (EUA). This EUA will remain  in effect (meaning this test can be used) for the duration of the COVID-19 declaration under Section 564(b)(1) of the Act, 21 U.S.C.section 360bbb-3(b)(1), unless the authorization is terminated  or revoked sooner.       Influenza A by PCR NEGATIVE NEGATIVE Final   Influenza B by PCR NEGATIVE NEGATIVE Final    Comment: (NOTE) The Xpert Xpress SARS-CoV-2/FLU/RSV plus assay is intended as an aid in the diagnosis of influenza from Nasopharyngeal swab specimens and should not be used as a sole basis for treatment. Nasal washings and aspirates are unacceptable for Xpert Xpress SARS-CoV-2/FLU/RSV testing.  Fact Sheet for Patients: EntrepreneurPulse.com.au  Fact Sheet for Healthcare Providers: IncredibleEmployment.be  This test is not yet approved or cleared by the [place] FDA and has been authorized for detection and/or diagnosis of SARS-CoV-2 by FDA under an Emergency Use Authorization (EUA). This EUA will remain in effect (meaning this test can be used) for the duration of  the COVID-19 declaration under Section 564(b)(1) of the Act, 21 U.S.C. section 360bbb-3(b)(1), unless the authorization is terminated or revoked.  Performed at [facility], [place] [address] [place], [place] [address]   Respiratory (~20 pathogens) panel by PCR     Status: None   Collection Time: 05/28/2022  5:44 PM   Specimen: Nasopharyngeal Swab; Respiratory  Result Value Ref Range Status   Adenovirus NOT DETECTED NOT DETECTED Final   Coronavirus 229E NOT DETECTED NOT DETECTED Final    Comment: (NOTE) The Coronavirus on the Respiratory Panel, DOES NOT test for the novel  Coronavirus (2019 nCoV)    Coronavirus HKU1 NOT DETECTED NOT DETECTED Final   Coronavirus NL63 NOT DETECTED NOT DETECTED Final   Coronavirus OC43 NOT DETECTED NOT DETECTED Final  Metapneumovirus NOT DETECTED NOT DETECTED Final   Rhinovirus / Enterovirus NOT DETECTED NOT DETECTED Final   Influenza A NOT DETECTED NOT DETECTED Final   Influenza B NOT DETECTED NOT DETECTED Final   Parainfluenza Virus 1 NOT DETECTED NOT DETECTED Final   Parainfluenza Virus 2 NOT DETECTED NOT DETECTED Final   Parainfluenza Virus 3 NOT DETECTED NOT DETECTED Final   Parainfluenza Virus 4 NOT DETECTED NOT DETECTED Final   Respiratory Syncytial Virus NOT DETECTED NOT DETECTED Final   Bordetella pertussis NOT DETECTED NOT DETECTED Final   Bordetella Parapertussis NOT DETECTED NOT DETECTED Final   Chlamydophila pneumoniae NOT DETECTED NOT DETECTED Final   Mycoplasma pneumoniae NOT DETECTED NOT DETECTED Final    Comment: Performed at [facility], [place] [address] [place], [place] [address]  Blood culture (routine x 2)     Status: None (Preliminary result)   Collection Time: 05/23/2022  5:46 PM   Specimen: BLOOD  Result Value Ref Range Status   Specimen Description   Final    BLOOD RIGHT CHEST Performed at [facility] [address] [place], [place] [address]    Special Requests   Final     BOTTLES DRAWN AEROBIC AND ANAEROBIC Blood Culture adequate volume Performed at [facility] [address], [place], [place] [address]    Culture   Final    NO GROWTH < 12 HOURS Performed at [facility] [address] [place], [place] [address]    Report Status PENDING  Incomplete  MRSA Next Gen by PCR, Nasal     Status: None   Collection Time: 06/03/2022  9:11 PM   Specimen: Nasal Mucosa; Nasal Swab  Result Value Ref Range Status   MRSA by PCR Next Gen NOT DETECTED NOT DETECTED Final    Comment: (NOTE) The GeneXpert MRSA Assay (FDA approved for NASAL specimens only), is one component of a comprehensive MRSA colonization surveillance program. It is not intended to diagnose MRSA infection nor to guide or monitor treatment for MRSA infections. Test performance is not FDA approved in patients less than 11 years old. Performed at [facility], [place] [address] [place], [place] [address]       Studies:  ECHOCARDIOGRAM COMPLETE  Result Date: 06/19/2022    ECHOCARDIOGRAM REPORT   Patient Name:   Jorge Adams Date of Exam: 06/19/2022 Medical Rec #:  [id number]          Height:       70.0 in Accession #:    [id number]         Weight:       156.7 lb Date of Birth:  1950/09/22         BSA:          1.882 m Patient Age:    52 years           BP:           143/76 mmHg Patient Gender: M                  HR:           111 bpm. Exam Location:  Inpatient Procedure: 2D Echo and Intracardiac Opacification Agent Indications:     elevated troponins  History:         Patient has no prior history of Echocardiogram examinations,                  most recent  02/24/2022. CAD, PAD; Risk Factors:Hypertension and                  Dyslipidemia.  Sonographer:     Harvie Junior Referring Phys:  [place] Diagnosing Phys: [place] IMPRESSIONS  1. Wall motion challenging to assess with tachycardia and challenging windows. Left ventricular ejection  fraction, by estimation, is 55 to 60%. The left ventricle has normal function. The left ventricle has no regional wall motion abnormalities. There is mild left ventricular hypertrophy. Left ventricular diastolic parameters are indeterminate.  2. Right ventricular systolic function is normal. The right ventricular size is normal.  3. No evidence of mitral valve regurgitation.  4. Aortic valve regurgitation is not visualized. Aortic valve sclerosis/calcification is present, without any evidence of aortic stenosis.  5. The inferior vena cava is normal in size with greater than 50% respiratory variability, suggesting right atrial pressure of 3 mmHg. Comparison(s): No significant change from prior study. FINDINGS  Left Ventricle: Wall motion challenging to assess with tachycardia and challenging windows. Left ventricular ejection fraction, by estimation, is 55 to 60%. The left ventricle has normal function. The left ventricle has no regional wall motion abnormalities. Definity contrast agent was given IV to delineate the left ventricular endocardial borders. The left ventricular internal cavity size was normal in size. There is mild left ventricular hypertrophy. Abnormal (paradoxical) septal motion consistent with post-operative status. Left ventricular diastolic parameters are indeterminate. Right Ventricle: The right ventricular size is normal. Right ventricular systolic function is normal. Left Atrium: Left atrial size was normal in size. Right Atrium: Right atrial size was normal in size. Pericardium: There is no evidence of pericardial effusion. Mitral Valve: No evidence of mitral valve regurgitation. Tricuspid Valve: Tricuspid valve regurgitation is mild. Aortic Valve: Aortic valve regurgitation is not visualized. Aortic valve sclerosis/calcification is present, without any evidence of aortic stenosis. Aortic valve mean gradient measures 4.0 mmHg. Aortic valve peak gradient measures 7.1 mmHg. Aortic valve  area, by  VTI measures 2.47 cm. Pulmonic Valve: Pulmonic valve regurgitation is not visualized. Venous: The inferior vena cava is normal in size with greater than 50% respiratory variability, suggesting right atrial pressure of 3 mmHg. IAS/Shunts: The interatrial septum was not well visualized.  LEFT VENTRICLE PLAX 2D LVIDd:         4.20 cm     Diastology LVIDs:         2.70 cm     LV e' medial:    12.10 cm/s LV PW:         0.90 cm     LV E/e' medial:  6.6 LV IVS:        1.00 cm     LV e' lateral:   10.90 cm/s LVOT diam:     2.20 cm     LV E/e' lateral: 7.3 LV SV:         47 LV SV Index:   25 LVOT Area:     3.80 cm  LV Volumes (MOD) LV vol d, MOD A2C: 97.0 ml LV vol d, MOD A4C: 91.8 ml LV vol s, MOD A2C: 40.7 ml LV vol s, MOD A4C: 44.3 ml LV SV MOD A2C:     56.3 ml LV SV MOD A4C:     91.8 ml LV SV MOD BP:      52.8 ml RIGHT VENTRICLE RV Basal diam:  4.20 cm RV Mid diam:    3.30 cm RV S prime:     12.60 cm/s TAPSE (M-mode):  1.9 cm LEFT ATRIUM             Index        RIGHT ATRIUM           Index LA Vol (A2C):   29.0 ml 15.41 ml/m  RA Area:     15.60 cm LA Vol (A4C):   30.6 ml 16.26 ml/m  RA Volume:   35.30 ml  18.76 ml/m LA Biplane Vol: 32.3 ml 17.16 ml/m  AORTIC VALVE                    PULMONIC VALVE AV Area (Vmax):    3.00 cm     PV Vmax:       0.88 m/s AV Area (Vmean):   2.73 cm     PV Peak grad:  3.1 mmHg AV Area (VTI):     2.47 cm AV Vmax:           133.00 cm/s AV Vmean:          89.600 cm/s AV VTI:            0.191 m AV Peak Grad:      7.1 mmHg AV Mean Grad:      4.0 mmHg LVOT Vmax:         105.00 cm/s LVOT Vmean:        64.300 cm/s LVOT VTI:          0.124 m LVOT/AV VTI ratio: 0.65  AORTA Ao Root diam: 3.70 cm Ao Asc diam:  3.40 cm MITRAL VALVE                TRICUSPID VALVE MV Area (PHT): 5.06 cm     TR Peak grad:   42.0 mmHg MV Decel Time: 150 msec     TR Vmax:        324.00 cm/s MV E velocity: 80.10 cm/s MV A velocity: 114.00 cm/s  SHUNTS MV E/A ratio:  0.70         Systemic VTI:  0.12 m                              Systemic Diam: 2.20 cm Phineas Inches Electronically signed by Phineas Inches Signature Date/Time: 06/19/2022/12:17:33 PM    Final (Updated)    ECHOCARDIOGRAM COMPLETE  Result Date: 06/19/2022    ECHOCARDIOGRAM REPORT   Patient Name:   Jorge Adams Date of Exam: 06/19/2022 Medical Rec #:  [id number]          Height:       70.0 in Accession #:    [id number]         Weight:       156.7 lb Date of Birth:  September 17, 1950         BSA:          1.882 m Patient Age:    51 years           BP:           143/76 mmHg Patient Gender: M                  HR:           111 bpm. Exam Location:  Inpatient Procedure: 2D Echo and Intracardiac Opacification Agent Indications:    elevated troponins  History:        Patient has no prior history of Echocardiogram examinations,  most recent 02/24/2022. CAD, PAD; Risk Factors:Hypertension and                 Dyslipidemia.  Sonographer:    Harvie Junior Referring Phys: [place]  1. Wall motion challenging to assess with tachycardia and challenging windows. Left ventricular ejection fraction, by estimation, is 55 to 60%. The left ventricle has normal function. The left ventricle has no regional wall motion abnormalities. There is mild left ventricular hypertrophy. Left ventricular diastolic parameters are indeterminate.  2. Right ventricular systolic function is normal. The right ventricular size is normal.  3. No evidence of mitral valve regurgitation.  4. Aortic valve regurgitation is not visualized. Aortic valve sclerosis/calcification is present, without any evidence of aortic stenosis.  5. The inferior vena cava is normal in size with greater than 50% respiratory variability, suggesting right atrial pressure of 3 mmHg. Comparison(s): No significant change from prior study. FINDINGS  Left Ventricle: Wall motion challenging to assess with tachycardia and challenging windows. Left ventricular ejection fraction, by estimation, is 55 to 60%.  The left ventricle has normal function. The left ventricle has no regional wall motion abnormalities. Definity contrast agent was given IV to delineate the left ventricular endocardial borders. The left ventricular internal cavity size was normal in size. There is mild left ventricular hypertrophy. Abnormal (paradoxical) septal motion consistent with post-operative status. Left ventricular diastolic parameters are indeterminate. Right Ventricle: The right ventricular size is normal. Right ventricular systolic function is normal. Left Atrium: Left atrial size was normal in size. Right Atrium: Right atrial size was normal in size. Pericardium: There is no evidence of pericardial effusion. Mitral Valve: No evidence of mitral valve regurgitation. Tricuspid Valve: Tricuspid valve regurgitation is mild. Aortic Valve: Aortic valve regurgitation is not visualized. Aortic valve sclerosis/calcification is present, without any evidence of aortic stenosis. Aortic valve mean gradient measures 4.0 mmHg. Aortic valve peak gradient measures 7.1 mmHg. Aortic valve  area, by VTI measures 2.47 cm. Pulmonic Valve: Pulmonic valve regurgitation is not visualized. Venous: The inferior vena cava is normal in size with greater than 50% respiratory variability, suggesting right atrial pressure of 3 mmHg. IAS/Shunts: The interatrial septum was not well visualized.  LEFT VENTRICLE PLAX 2D LVIDd:         4.20 cm     Diastology LVIDs:         2.70 cm     LV e' medial:    12.10 cm/s LV PW:         0.90 cm     LV E/e' medial:  6.6 LV IVS:        1.00 cm     LV e' lateral:   10.90 cm/s LVOT diam:     2.20 cm     LV E/e' lateral: 7.3 LV SV:         47 LV SV Index:   25 LVOT Area:     3.80 cm  LV Volumes (MOD) LV vol d, MOD A2C: 97.0 ml LV vol d, MOD A4C: 91.8 ml LV vol s, MOD A2C: 40.7 ml LV vol s, MOD A4C: 44.3 ml LV SV MOD A2C:     56.3 ml LV SV MOD A4C:     91.8 ml LV SV MOD BP:      52.8 ml RIGHT VENTRICLE RV Basal diam:  4.20 cm RV Mid diam:     3.30 cm RV S prime:     12.60 cm/s TAPSE (M-mode): 1.9 cm LEFT ATRIUM  Index        RIGHT ATRIUM           Index LA Vol (A2C):   29.0 ml 15.41 ml/m  RA Area:     15.60 cm LA Vol (A4C):   30.6 ml 16.26 ml/m  RA Volume:   35.30 ml  18.76 ml/m LA Biplane Vol: 32.3 ml 17.16 ml/m  AORTIC VALVE                    PULMONIC VALVE AV Area (Vmax):    3.00 cm     PV Vmax:       0.88 m/s AV Area (Vmean):   2.73 cm     PV Peak grad:  3.1 mmHg AV Area (VTI):     2.47 cm AV Vmax:           133.00 cm/s AV Vmean:          89.600 cm/s AV VTI:            0.191 m AV Peak Grad:      7.1 mmHg AV Mean Grad:      4.0 mmHg LVOT Vmax:         105.00 cm/s LVOT Vmean:        64.300 cm/s LVOT VTI:          0.124 m LVOT/AV VTI ratio: 0.65  AORTA Ao Root diam: 3.70 cm Ao Asc diam:  3.40 cm MITRAL VALVE                TRICUSPID VALVE MV Area (PHT): 5.06 cm     TR Peak grad:   42.0 mmHg MV Decel Time: 150 msec     TR Vmax:        324.00 cm/s MV E velocity: 80.10 cm/s MV A velocity: 114.00 cm/s  SHUNTS MV E/A ratio:  0.70         Systemic VTI:  0.12 m                             Systemic Diam: 2.20 cm Phineas Inches Electronically signed by Phineas Inches Signature Date/Time: 06/19/2022/12:17:33 PM    Final    VAS [place] LOWER EXTREMITY VENOUS (DVT)  Result Date: 06/19/2022  Lower Venous DVT Study Patient Name:  Jorge Adams  Date of Exam:   06/19/2022 Medical Rec #: [id number]           Accession #:    [id number] Date of Birth: 06-Oct-1950          Patient Gender: M Patient Age:   71 years Exam Location[facility]  [facility] Procedure:      VAS [place] LOWER EXTREMITY VENOUS (DVT) Referring Phys: Adrian Prows --------------------------------------------------------------------------------  Indications: Swelling.  Risk Factors: Cancer. Limitations: Poor ultrasound/tissue interface. Comparison Study: No prior studies. Performing Technologist: Oliver Hum RVT  Examination Guidelines: A complete evaluation includes B-mode imaging,  spectral Doppler, color Doppler, and power Doppler as needed of all accessible portions of each vessel. Bilateral testing is considered an integral part of a complete examination. Limited examinations for reoccurring indications may be performed as noted. The reflux portion of the exam is performed with the patient in reverse Trendelenburg.  +---------+---------------+---------+-----------+----------+--------------+ RIGHT    CompressibilityPhasicitySpontaneityPropertiesThrombus Aging +---------+---------------+---------+-----------+----------+--------------+ CFV      Full           Yes      Yes                                 +---------+---------------+---------+-----------+----------+--------------+  SFJ      Full                                                        +---------+---------------+---------+-----------+----------+--------------+ FV Prox  Full                                                        +---------+---------------+---------+-----------+----------+--------------+ FV Mid   Full           Yes      Yes                                 +---------+---------------+---------+-----------+----------+--------------+ FV DistalFull                                                        +---------+---------------+---------+-----------+----------+--------------+ PFV      Full                                                        +---------+---------------+---------+-----------+----------+--------------+ POP      Full           Yes      Yes                                 +---------+---------------+---------+-----------+----------+--------------+ PTV      Full                                                        +---------+---------------+---------+-----------+----------+--------------+ PERO     Partial                                      Acute          +---------+---------------+---------+-----------+----------+--------------+    +---------+---------------+---------+-----------+----------+--------------+ LEFT     CompressibilityPhasicitySpontaneityPropertiesThrombus Aging +---------+---------------+---------+-----------+----------+--------------+ CFV      Full           Yes      Yes                                 +---------+---------------+---------+-----------+----------+--------------+ SFJ      Full                                                        +---------+---------------+---------+-----------+----------+--------------+  FV Prox  Full                                                        +---------+---------------+---------+-----------+----------+--------------+ FV Mid   Full           Yes      Yes                                 +---------+---------------+---------+-----------+----------+--------------+ FV DistalFull           Yes      Yes                                 +---------+---------------+---------+-----------+----------+--------------+ PFV      Full                                                        +---------+---------------+---------+-----------+----------+--------------+ POP      None           No       No                                  +---------+---------------+---------+-----------+----------+--------------+ PTV      Full                                                        +---------+---------------+---------+-----------+----------+--------------+ PERO     Full                                                        +---------+---------------+---------+-----------+----------+--------------+    Summary: RIGHT: - Findings consistent with acute deep vein thrombosis involving the right peroneal veins. - No cystic structure found in the popliteal fossa.  LEFT: - Findings consistent with acute deep vein thrombosis involving the left popliteal vein. - No cystic structure found in the popliteal fossa.  *See table(s) above for measurements and  observations.    Preliminary    DG Chest Portable 1 View  Result Date: 06/09/2022 CLINICAL DATA:  Per chart: Patient has shortness of breath and feeling weak where he cannot walk the past 2 days. He received a CT scan yesterday that showed an infection. Started doxycycline. Patient has lung cancer stage 3, currently getting chemo. EXAM: PORTABLE CHEST - 1 VIEW COMPARISON:  05/20/2022 FINDINGS: Stable right IJ port to the proximal right atrium. Worsening coarse interstitial throughout both lungs, with some coarse airspace opacities throughout the right upper lung. Heart size and mediastinal contours are within normal limits. CABG markers. Blunting of the right lateral costophrenic angle suggesting small effusion. Sternotomy wires.  Cervical fixation hardware partially visualized. IMPRESSION: 1. Worsening coarse  interstitial and right upper lung airspace opacities. 2. Small right effusion. Electronically Signed   By: Lucrezia Europe M.D.   On: 06/15/2022 19:33    Assessment: 71 y.o. male with stage III NSCLC  Acute respiratory failure with hypoxia, secondary to pneumonia versus pneumonitis, possible PE Acute bilateral lower extremity DVT Community-acquired pneumonia versus immunotherapy related pneumonitis CHF CAD DM HTN PAD CKD Stage III squamous cell carcinoma of right lower lobe, Adipost concurrent chemoradiation, and 2 doses of adjuvant durvalumab, last dose 04/24/2022    Plan:  -I have reviewed his CT chest images myself, he has diffuse bilateral patchy groundglass in the right upper lobe and left lower lobe, concerning for pneumonia versus pneumonitis.  His last dose immunotherapy was about 2 months ago, but certainly pneumonitis can still happen a few months after last dose.  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 -he is also on broad antibiotics and IV heparin for  thrombosis -appreciate ICU team and hospitalist team's care  -He has responded just to chemoradiation his stage III lung cancer, he is not terminal from cancer standpoint, he is full code which is appropriate  -I did discuss the severity with patient and his wife, he may need intubation and life support give his severe hypoxia  -I will f/u tomorrow    Truitt Merle, MD 06/19/2022  6:02 PM

## 2022-06-19 NOTE — Progress Notes (Signed)
ANTICOAGULATION CONSULT NOTE - Follow Up Consult  Pharmacy Consult for Heparin Indication: chest pain/ACS, pulmonary embolus, and DVT  Allergies  Allergen Reactions   Metformin And Related     Held due to creatinine elevation September 2023.   Budesonide Rash    Drug rash with oral [place]     Patient Measurements: Weight: 71.1 kg (156 lb 12 oz) Heparin Dosing Weight: TBW  Vital Signs: Temp: 98.6 F (37 C) (11/29 1600) Temp Source: Oral (11/29 1600) BP: 123/61 (11/29 1600) Pulse Rate: 111 (11/29 1600)  Labs: Recent Labs    06/04/2022 1705 05/30/2022 2230 06/19/22 0215 06/19/22 0515 06/19/22 0825  HGB 8.6*  --   --  8.4* 9.0*  HCT 25.9*  --   --  26.2* 27.7*  PLT 190  --   --  193 204  LABPROT 15.0  --   --   --   --   INR 1.2  --   --   --   --   HEPARINUNFRC  --   --   --   --  <0.10*  CREATININE 2.42*  --   --  2.27* 2.25*  CKTOTAL 63  --   --   --   --   TROPONINIHS 2,602*   < > 2,990* 2,574* 2,623*   < > = values in this interval not displayed.    Estimated Creatinine Clearance: 29.4 mL/min (A) (by C-G formula based on SCr of 2.25 mg/dL (H)).   Medications:  Infusions:   ceFEPime (MAXIPIME) IV Stopped (06/19/22 0809)   heparin 1,000 Units/hr (06/19/22 0762)    Assessment: 71 yo male presents with SOB and fatigue, medical history significant of squamous carcinoma of the right lung, Stage III, hypertension, hyperlipidemia, obesity, CAD, type 2 diabetes, PAD gout dysphagia electrolyte disbalance this cervical spine stenosis with myelopathy. Pharmacy to dose heparin drip for ACS/STEMI.  Dopplers + bilateral LE DVT Unable to obtain CTa due to renal function, but treating empirically for PE.   Today, 06/19/2022: Heparin level < 0.1, remains subtherapeutic on heparin 1000 units/hr - Currently infusing through peripheral IV (not the port).  RN confirms no pauses, interruptions, or IV issues.   CBC: Hgb remains low/improved to 9, Plt WNL Troponins: 2762, 2574,  2623 SCr 2.25 No bleeding or complications reported.  Goal of Therapy:  Heparin level 0.3-0.7 units/ml Monitor platelets by anticoagulation protocol: Yes   Plan:  Give heparin 2000 units bolus IV x 1 Increase to heparin IV infusion at 1250 units/hr Heparin level 8 hours after rate change Daily heparin level and CBC   Gretta Arab PharmD, BCPS [facility] main pharmacy [phone] 06/19/2022 6:10 PM

## 2022-06-19 NOTE — Progress Notes (Signed)
OT Cancellation Note  Patient Details Name: Jorge Adams MRN: [id number] DOB: Mar 01, 1951   Cancelled Treatment:    Reason Eval/Treat Not Completed: Medical issues which prohibited therapy Patient is currently on non-rebreather with increased O2 needs. OT to continue to follow and check back as schedule will allow.  Rennie Plowman, MS Acute Rehabilitation Department Office# [phone]  06/19/2022, 12:43 PM

## 2022-06-19 NOTE — Progress Notes (Signed)
PROGRESS NOTE    TC KAPUSTA  JOI:[id number] DOB: 29-Apr-1951 DOA: 05/25/2022 PCP: [name], MD   Brief Narrative:  The patient is a 71 year old Caucasian male with past medical history significant for but not limited to stage IIIb squamous carcinoma of the right lung, hypertension, hyperlipidemia, CAD, diabetes mellitus type 2, PAD, history of gout, history of dysphagia as well as electrolyte disbalance as well as cervical spinal stenosis with myelopathy and other comorbidities who presents with worsening shortness of breath and fatigue.  Patient noted that he had a cough for the last 4 days and worsening shortness of breath and had a CT scan that showed possible inflammation and he has been taking doxycycline for 2 weeks without making progress.  He has had decreased p.o. intake and has been feeling dehydrated and worsened to the point where he got progressively weak to the point where it was difficult from walking.  He currently sees oncology Dr. Earlie Server is currently undergoing chemotherapy and of note the last week he was told he had UTI and was treated with unknown antibiotic as well.  Patient has had a subjective fever and states that he had diarrhea on Thursday and Friday which is now stopped after taking the meds for his UTI.  In the ED he had to be placed on a nonrebreather given that he significantly desaturated.  Troponins were significantly elevated and cardiology was consulted as well as pulmonary.  Will notify the oncology team via epic given that he is hospitalized.  Further workup reveals that he has extensive volume overload and acute on chronic diastolic CHF so cardiology has recommended diuresing.  Patient was found to have extensive lower extremity venous DVTs bilaterally and he has been anticoagulated heparin drip.  Currently he is being admitted and treated for the following but not limited to:   Assessment and Plan:  Acute Respiratory Failure with Hypoxia likely  secondary to suspected pneumonia versus pneumonitis, possible PE as well as acute on Chronic Diastolic CHF -Is admitted to the inpatient progressive unit but we have changed him to the stepdown unit given his significant oxygen requirement and high risk for decompensation -SpO2: 91 % O2 Flow Rate (L/min): 10 L/min -Pulmonary and cardiology has been consulted and appreciate further evaluation recommendations -Cardiology recommends obtaining an echocardiogram and treating empirically for PE -Unfortunately unable to obtain a CTA given his renal function and pulmonary recommends holding off given his tenuous respiratory status currently -Pulmonary feels that his lung findings are related to immunomodulators induced pneumonitis -Will empirically treat him for a PE and have started anticoagulation with a heparin drip -May recommends obtaining ESR and starting IV steroids -Continue diuresis per cardiology and echocardiogram as below -CT Scan done 06/17/22 showed "Interval response to therapy as evidenced by decrease in size of a cavitary mass in the right lower lobe. Interval improvement in interstitial thickening and peribronchovascular nodularity in the right middle and right lower lobes. New patchy  ground-glass in the upper lobes and left lower lobe with associated areas of consolidation in the right upper lobe. Findings are likely infectious/inflammatory in etiology given the relatively short interval from 02/22/2022. Enlarging low right paratracheal lymph node, possibly reactive. recommend attention on follow-up. Small right pleural effusion, increased. Cylindrical bronchiectasis. Cholelithiasis. Aortic atherosclerosis (ICD10-I70.0). Enlarged pulmonic trunk, indicative of pulmonary arterial hypertension. Emphysema" -Pulmonary feels that if he ends up intubated he would benefit from bronchoscopy and they are recommending oxygen saturations greater than 90% -Need an amatory home O2 screen prior  to  discharge as well as repeat chest x-ray in the a.m. -Continue with albuterol neb 3 mL every 6 as needed for cough as well as antibiotic coverage for HCAP with cefepime -Currently on scheduled bronchodilators with DuoNeb 3 mL 4 times daily as well as IV as needed every 4 hours for wheezing or shortness of breath  Acute Bilateral DVT concern for PE -LE Venous Duplex done and showed "Findings consistent with acute deep vein thrombosis involving the right peroneal veins. No cystic structure found in the popliteal fossa.Findings consistent with acute deep vein thrombosis involving the left popliteal vein. No cystic structure found in the popliteal fossa."  -Currently has been started on heparin drip and will continue for now will need oncology involvement given his history of lung cancer to transition likely to Lovenox or DOAC  Hypothyroidism -Continue levothyroxine 50 mcg p.o. daily before breakfast -Can check TSH in the morning  CAP (community acquired pneumonia) versus pneumonitis -Failed outpatient treatment history of chemotherapy -Given suspected compromised immune system -Due to brought spectrum coverage for right now with cefepime and IV Vancomycin which has now been discontinued  given Negative MRSA.   -Obtain blood cultures sputum cultures check for Legionella and Strep Pneumo -PCT was 0.53 -Pulmonary recommending starting steroids and have initiated methylprednisolone 125 mg IV daily   Hypertension associated with diabetes ([place]) -Allow permissive hypertension for tonight   Hyperlipidemia LDL goal <70 -Continue Rosuvastatin 20 mg a day and Fenofibrate 160 mg a day   Type 2 Diabetes mellitus with vascular disease (HCC) -Continue with sensitive NovoLog sliding scale insulin every 4 hours and continue to Monitor blood sugars per protocol -CBGs ranging from 62-158   CAD (Coronary Artery Disease) -Continue Aspirin and Rosuvastatin 20 mg p.o. daily hold beta-blocker for tonight restart  when able   Hypokalemia -Potassium remains on the lower side at 3.3 and will replete with IV KCl -Continue to monitor and replete as necessary -Repeat CMP in a.m.   PAD (peripheral artery disease) (HCC) -Continue Aspirin 81 mg p.o. daily, Cilostazol 100 g p.o. daily and Rosuvastatin 20 mg p.o. daily   Stage III squamous cell carcinoma of right lung (HCC) -Added Dr. Earlie Server to the care teams for further evaluation   Normocytic Anemia -Chronic but progressive  -Patient's hemoglobin/hematocrit is now 8.4/26.2 and is now improved to 9.0/27.7 Check anemia panel and Continue monitor for signs and symptoms of bleeding; no overt bleeding noted -Repeat CBC in a.m.    CKD (chronic kidney disease), stage IV (HCC) Metabolic Acidosis  -Chronic  -Avoid nephrotoxic medications such as NSAIDs, Vanco Zosyn combo as well as hypotension and dehydration to ensure adequate renal perfusion and avoid IV contrast if possible -Patient's BUNs/creatinine has gone from 57/2.42 -> 51/2.25 -Patient CO2 is now 21, anion gap is 12, chloride level is 104 -Continue To monitor trend and repeat CMP in a.m.  Acute on Chronic diastolic CHF (congestive heart failure) [facility]) -Cardiology has been consulted and resuming Torsemide 20 mg p.o. twice daily -Appreciate cardiology assistance and they have resumed metoprolol succinate 25 mg p.o. daily, rosuvastatin 20 mg p.o. daily, BiDil 1 tab p.o. 3 times daily and aspirin 81 mg p.o. daily -Cardiology recommends following up on BMP serially over the next 2 to 3 days and he is not on any ACE inhibitors due to his renal failure -Strict I's and O's and daily weights -Continue to monitor for signs and symptoms of volume overload  Hypoalbuminemia -Albumin level is now 1.7 -Continue to monitor and  trend   Elevated Troponin -No chest pain discussed with cardiology most likely demand ischemia will obtain echogram for right now cover definitive heparin per pharmacy until results of  echocardiogram.   -Patient's troponin went from 2990 -> 2754 -> 2623 -Mains on heparin drip for anticoagulant for DVT and suspected PE -Patient did have an abnormal EKG but troponin has been flat around 3000 and cardiology feels that this could either be a cardiomyopathy versus a PE in the setting with EKG abnormality   Suspected UTI (urinary tract infection) -Patient's urinalysis showed a cloudy appearance with amber-colored urine, large hemoglobin, large leukocytes, greater than 300 protein, many bacteria, greater than 50 RBCs per high-power field, 0-5 squamous epithelial cells, greater than 50 WBCs with urine culture pending -Currently remains on IV ceftriaxone   DVT prophylaxis: SCDs Start: 06/19/22 0810    Code Status: Full Code Family Communication: Discussed with wife at bedside  Disposition Plan:  Level of care: Stepdown Status is: Inpatient Remains inpatient appropriate because: Patient remains on a significant amount of oxygen and will need further clinical improvement prior to safe discharge disposition   Consultants:  Cardiology Pulmonology Will notify Medical Oncology and Dr. Burr Medico to see the patient this evening   Procedures:  LE VENOUS DUPLEX   Lower Venous DVT Study  Patient Name:  KAHRON KAUTH  Date of Exam:   06/19/2022 Medical Rec #: [id number]           Accession #:    [id number] Date of Birth: Jun 05, 1951          Patient Gender: M Patient Age:   67 years Exam Location:  [facility] Procedure:      VAS [place] LOWER EXTREMITY VENOUS (DVT) Referring Phys: Adrian Prows   --------------------------------------------------------------------------- -----   Indications: Swelling.   Risk Factors: Cancer. Limitations: Poor ultrasound/tissue interface. Comparison Study: No prior studies.  Performing Technologist: Oliver Hum RVT    Examination Guidelines: A complete evaluation includes B-mode imaging, spectral Doppler, color Doppler, and power  Doppler as needed of all accessible portions of each vessel. Bilateral testing is considered an integral part of a complete examination. Limited examinations for reoccurring indications may be performed as noted. The reflux portion of the exam is performed with the patient in reverse Trendelenburg.     +---------+---------------+---------+-----------+----------+--------------+  RIGHT    CompressibilityPhasicitySpontaneityPropertiesThrombus Aging +---------+---------------+---------+-----------+----------+--------------+  CFV      Full           Yes      Yes                                  +---------+---------------+---------+-----------+----------+--------------+  SFJ      Full                                                         +---------+---------------+---------+-----------+----------+--------------+  FV Prox  Full                                                         +---------+---------------+---------+-----------+----------+--------------+  FV Mid   Full  Yes      Yes                                  +---------+---------------+---------+-----------+----------+--------------+  FV DistalFull                                                         +---------+---------------+---------+-----------+----------+--------------+  PFV      Full                                                         +---------+---------------+---------+-----------+----------+--------------+  POP      Full           Yes      Yes                                  +---------+---------------+---------+-----------+----------+--------------+  PTV      Full                                                         +---------+---------------+---------+-----------+----------+--------------+  PERO     Partial                                      Acute           +---------+---------------+---------+-----------+----------+--------------+         +---------+---------------+---------+-----------+----------+--------------+  LEFT     CompressibilityPhasicitySpontaneityPropertiesThrombus Aging +---------+---------------+---------+-----------+----------+--------------+  CFV      Full           Yes      Yes                                  +---------+---------------+---------+-----------+----------+--------------+  SFJ      Full                                                         +---------+---------------+---------+-----------+----------+--------------+  FV Prox  Full                                                         +---------+---------------+---------+-----------+----------+--------------+  FV Mid   Full           Yes      Yes                                  +---------+---------------+---------+-----------+----------+--------------+  FV DistalFull           Yes      Yes                                  +---------+---------------+---------+-----------+----------+--------------+  PFV      Full                                                         +---------+---------------+---------+-----------+----------+--------------+  POP      None           No       No                                   +---------+---------------+---------+-----------+----------+--------------+  PTV      Full                                                         +---------+---------------+---------+-----------+----------+--------------+  PERO     Full                                                         +---------+---------------+---------+-----------+----------+--------------+           Summary: RIGHT: - Findings consistent with acute deep vein thrombosis involving the right peroneal veins. - No cystic structure found in the popliteal fossa.   LEFT: -  Findings consistent with acute deep vein thrombosis involving the left popliteal vein. - No cystic structure found in the popliteal fossa.     ECHOCARDIOGRAM MPRESSIONS     1. Wall motion challenging to assess with tachycardia and challenging  windows. Left ventricular ejection fraction, by estimation, is 55 to 60%.  The left ventricle has normal function. The left ventricle has no regional  wall motion abnormalities. There  is mild left ventricular hypertrophy. Left ventricular diastolic  parameters are indeterminate.   2. Right ventricular systolic function is normal. The right ventricular  size is normal.   3. No evidence of mitral valve regurgitation.   4. Aortic valve regurgitation is not visualized. Aortic valve  sclerosis/calcification is present, without any evidence of aortic  stenosis.   5. The inferior vena cava is normal in size with greater than 50%  respiratory variability, suggesting right atrial pressure of 3 mmHg.   Comparison(s): No significant change from prior study.   FINDINGS   Left Ventricle: Wall motion challenging to assess with tachycardia and  challenging windows. Left ventricular ejection fraction, by estimation, is  55 to 60%. The left ventricle has normal function. The left ventricle has  no regional wall motion  abnormalities. Definity contrast agent was given IV to delineate the left  ventricular endocardial borders. The left ventricular internal cavity size  was normal in size. There is mild left ventricular hypertrophy. Abnormal  (paradoxical) septal motion  consistent with post-operative status. Left  ventricular diastolic  parameters are indeterminate.   Right Ventricle: The right ventricular size is normal. Right ventricular  systolic function is normal.   Left Atrium: Left atrial size was normal in size.   Right Atrium: Right atrial size was normal in size.   Pericardium: There is no evidence of pericardial effusion.   Mitral Valve:  No evidence of mitral valve regurgitation.   Tricuspid Valve: Tricuspid valve regurgitation is mild.   Aortic Valve: Aortic valve regurgitation is not visualized. Aortic valve  sclerosis/calcification is present, without any evidence of aortic  stenosis. Aortic valve mean gradient measures 4.0 mmHg. Aortic valve peak  gradient measures 7.1 mmHg. Aortic valve   area, by VTI measures 2.47 cm.   Pulmonic Valve: Pulmonic valve regurgitation is not visualized.   Venous: The inferior vena cava is normal in size with greater than 50%  respiratory variability, suggesting right atrial pressure of 3 mmHg.   IAS/Shunts: The interatrial septum was not well visualized.     LEFT VENTRICLE  PLAX 2D  LVIDd:         4.20 cm     Diastology  LVIDs:         2.70 cm     LV e' medial:    12.10 cm/s  LV PW:         0.90 cm     LV E/e' medial:  6.6  LV IVS:        1.00 cm     LV e' lateral:   10.90 cm/s  LVOT diam:     2.20 cm     LV E/e' lateral: 7.3  LV SV:         47  LV SV Index:   25  LVOT Area:     3.80 cm    LV Volumes (MOD)  LV vol d, MOD A2C: 97.0 ml  LV vol d, MOD A4C: 91.8 ml  LV vol s, MOD A2C: 40.7 ml  LV vol s, MOD A4C: 44.3 ml  LV SV MOD A2C:     56.3 ml  LV SV MOD A4C:     91.8 ml  LV SV MOD BP:      52.8 ml   RIGHT VENTRICLE  RV Basal diam:  4.20 cm  RV Mid diam:    3.30 cm  RV S prime:     12.60 cm/s  TAPSE (M-mode): 1.9 cm   LEFT ATRIUM             Index        RIGHT ATRIUM           Index  LA Vol (A2C):   29.0 ml 15.41 ml/m  RA Area:     15.60 cm  LA Vol (A4C):   30.6 ml 16.26 ml/m  RA Volume:   35.30 ml  18.76 ml/m  LA Biplane Vol: 32.3 ml 17.16 ml/m   AORTIC VALVE                    PULMONIC VALVE  AV Area (Vmax):    3.00 cm     PV Vmax:       0.88 m/s  AV Area (Vmean):   2.73 cm     PV Peak grad:  3.1 mmHg  AV Area (VTI):     2.47 cm  AV Vmax:           133.00 cm/s  AV Vmean:  89.600 cm/s  AV VTI:            0.191 m  AV Peak Grad:      7.1 mmHg   AV Mean Grad:      4.0 mmHg  LVOT Vmax:         105.00 cm/s  LVOT Vmean:        64.300 cm/s  LVOT VTI:          0.124 m  LVOT/AV VTI ratio: 0.65    AORTA  Ao Root diam: 3.70 cm  Ao Asc diam:  3.40 cm   MITRAL VALVE                TRICUSPID VALVE  MV Area (PHT): 5.06 cm     TR Peak grad:   42.0 mmHg  MV Decel Time: 150 msec     TR Vmax:        324.00 cm/s  MV E velocity: 80.10 cm/s  MV A velocity: 114.00 cm/s  SHUNTS  MV E/A ratio:  0.70         Systemic VTI:  0.12 m                              Systemic Diam: 2.20 cm   Antimicrobials:  Anti-infectives (From admission, onward)    Start     Dose/Rate Route Frequency Ordered Stop   06/19/22 2200  vancomycin (VANCOREADY) IVPB 1250 mg/250 mL  Status:  Discontinued        1,250 mg 166.7 mL/hr over 90 Minutes Intravenous Every 48 hours 06/13/2022 2126 06/19/22 1214   05/29/2022 2200  vancomycin (VANCOREADY) IVPB 1250 mg/250 mL        1,250 mg 166.7 mL/hr over 90 Minutes Intravenous  Once 06/04/2022 2126 06/19/22 0808   06/08/2022 2200  ceFEPIme (MAXIPIME) 2 g in sodium chloride 0.9 % 100 mL IVPB        2 g 200 mL/hr over 30 Minutes Intravenous Every 24 hours 05/25/2022 2126     06/16/2022 1630  cefTRIAXone (ROCEPHIN) 1 g in sodium chloride 0.9 % 100 mL IVPB        1 g 200 mL/hr over 30 Minutes Intravenous  Once 06/01/2022 1624 06/10/2022 1830   06/05/2022 1630  doxycycline (VIBRA-TABS) tablet 100 mg        100 mg Oral  Once 06/15/2022 1624 06/15/2022 1749       Subjective: And examined at bedside and he is still feeling short of breath but denies any chest pain.  Wife states that he is doing a little worse today than he was yesterday.  States that he breathes easier with the nonrebreather.  No nausea or vomiting.  Denies any lightheadedness or dizziness but states that he does not wear oxygen at home.  No other concerns or complaints this time.  Objective: Vitals:   06/19/22 0921 06/19/22 1100 06/19/22 1200 06/19/22 1300  BP:  (!) 143/76 136/73  103/78  Pulse:  (!) 108 (!) 111 (!) 124  Resp:  (!) 21 (!) 28 (!) 29  Temp: 99.1 F (37.3 C)   98.9 F (37.2 C)  TempSrc: Axillary   Oral  SpO2:  92% 94% 91%  Weight:        Intake/Output Summary (Last 24 hours) at 06/19/2022 1422 Last data filed at 06/19/2022 0809 Gross per 24 hour  Intake 1006.03 ml  Output --  Net 1006.03 ml  Filed Weights   05/30/2022 2209  Weight: 71.1 kg   Examination: Physical Exam:  Constitutional: Chronically ill-appearing Caucasian male currently in mild respiratory distress Respiratory: Diminished to auscultation bilaterally with coarse breath sounds and some slight rhonchi and crackles, no wheezing, rales. Normal respiratory effort and patient is not tachypenic. No accessory muscle use.  Wearing quite a bit of supplemental oxygen via nasal cannula Cardiovascular: RRR, no murmurs / rubs / gallops. S1 and S2 auscultated.  As 1-2 Abdomen: Soft, non-tender, distended secondary body habitus yes bowel sounds positive.  GU: Deferred. Musculoskeletal: No clubbing / cyanosis of digits/nails.  Normal strength and muscle tone.  Skin: No rashes, lesions, ulcers on a limited skin evaluation. No induration; Warm and dry.  Neurologic: CN 2-12 grossly intact with no focal deficits. Romberg sign and cerebellar reflexes not assessed.  Psychiatric: Normal judgment and insight. Alert and oriented x 3. Normal mood and appropriate affect.   Data Reviewed: I have personally reviewed following labs and imaging studies  CBC: Recent Labs  Lab 05/25/2022 1705 06/19/22 0515 06/19/22 0825  WBC 4.5 5.4 5.5  NEUTROABS 3.7  --   --   HGB 8.6* 8.4* 9.0*  HCT 25.9* 26.2* 27.7*  MCV 89.6 90.7 89.9  PLT 190 193 287   Basic Metabolic Panel: Recent Labs  Lab 06/12/2022 1705 06/07/2022 2116 06/19/22 0515 06/19/22 0825  NA 132*  --   --  137  K 3.1*  --   --  3.3*  CL 100  --   --  104  CO2 23  --   --  21*  GLUCOSE 201*  --   --  101*  BUN 57*  --   --  51*  CREATININE  2.42*  --  2.27* 2.25*  CALCIUM 7.5*  --   --  7.6*  MG 1.5* 1.3* 1.9  --   PHOS 4.3  --  3.9  --    GFR: Estimated Creatinine Clearance: 29.4 mL/min (A) (by C-G formula based on SCr of 2.25 mg/dL (H)). Liver Function Tests: Recent Labs  Lab 05/28/2022 1705 06/19/22 0825  AST 43* 54*  ALT 21 20  ALKPHOS 52 51  BILITOT 0.8 0.7  PROT 5.5* 5.2*  ALBUMIN 2.0* 1.7*   No results for input(s): "LIPASE", "AMYLASE" in the last 168 hours. No results for input(s): "AMMONIA" in the last 168 hours. Coagulation Profile: Recent Labs  Lab 05/25/2022 1705  INR 1.2   Cardiac Enzymes: Recent Labs  Lab 05/28/2022 1705  CKTOTAL 63   BNP (last 3 results) Recent Labs    04/01/22 1548 04/10/22 1035  PROBNP 320.0* 575.0*   HbA1C: No results for input(s): "HGBA1C" in the last 72 hours. CBG: Recent Labs  Lab 06/19/22 0503 06/19/22 0539 06/19/22 0609 06/19/22 0803 06/19/22 1202  GLUCAP 62* 74 81 67* 158*   Lipid Profile: No results for input(s): "CHOL", "HDL", "LDLCALC", "TRIG", "CHOLHDL", "LDLDIRECT" in the last 72 hours. Thyroid Function Tests: Recent Labs    06/02/2022 2116  TSH 1.560   Anemia Panel: Recent Labs    06/17/2022 2116  VITAMINB12 607  FOLATE 32.9  FERRITIN 2,780*  TIBC 178*  IRON 15*  RETICCTPCT 1.6   Sepsis Labs: Recent Labs  Lab 06/01/2022 1705 06/19/22 0515  PROCALCITON 0.73 0.53  LATICACIDVEN 0.9  --    Recent Results (from the past 240 hour(s))  Resp Panel by RT-PCR (Flu A&B, Covid) Porta Cath     Status: None   Collection Time: 06/17/2022  5:44 PM   Specimen: Porta Cath; Nasal Swab  Result Value Ref Range Status   SARS Coronavirus 2 by RT PCR NEGATIVE NEGATIVE Final    Comment: (NOTE) SARS-CoV-2 target nucleic acids are NOT DETECTED.  The SARS-CoV-2 RNA is generally detectable in upper respiratory specimens during the acute phase of infection. The lowest concentration of SARS-CoV-2 viral copies this assay can detect is 138 copies/mL. A negative  result does not preclude SARS-Cov-2 infection and should not be used as the sole basis for treatment or other patient management decisions. A negative result may occur with  improper specimen collection/handling, submission of specimen other than nasopharyngeal swab, presence of viral mutation(s) within the areas targeted by this assay, and inadequate number of viral copies(<138 copies/mL). A negative result must be combined with clinical observations, patient history, and epidemiological information. The expected result is Negative.  Fact Sheet for Patients:  EntrepreneurPulse.com.au  Fact Sheet for Healthcare Providers:  IncredibleEmployment.be  This test is no t yet approved or cleared by the [place] FDA and  has been authorized for detection and/or diagnosis of SARS-CoV-2 by FDA under an Emergency Use Authorization (EUA). This EUA will remain  in effect (meaning this test can be used) for the duration of the COVID-19 declaration under Section 564(b)(1) of the Act, 21 U.S.C.section 360bbb-3(b)(1), unless the authorization is terminated  or revoked sooner.       Influenza A by PCR NEGATIVE NEGATIVE Final   Influenza B by PCR NEGATIVE NEGATIVE Final    Comment: (NOTE) The Xpert Xpress SARS-CoV-2/FLU/RSV plus assay is intended as an aid in the diagnosis of influenza from Nasopharyngeal swab specimens and should not be used as a sole basis for treatment. Nasal washings and aspirates are unacceptable for Xpert Xpress SARS-CoV-2/FLU/RSV testing.  Fact Sheet for Patients: EntrepreneurPulse.com.au  Fact Sheet for Healthcare Providers: IncredibleEmployment.be  This test is not yet approved or cleared by the [place] FDA and has been authorized for detection and/or diagnosis of SARS-CoV-2 by FDA under an Emergency Use Authorization (EUA). This EUA will remain in effect (meaning this test can be used)  for the duration of the COVID-19 declaration under Section 564(b)(1) of the Act, 21 U.S.C. section 360bbb-3(b)(1), unless the authorization is terminated or revoked.  Performed at [facility], [place] [address], [place], [place] [address]   Respiratory (~20 pathogens) panel by PCR     Status: None   Collection Time: 06/16/2022  5:44 PM   Specimen: Nasopharyngeal Swab; Respiratory  Result Value Ref Range Status   Adenovirus NOT DETECTED NOT DETECTED Final   Coronavirus 229E NOT DETECTED NOT DETECTED Final    Comment: (NOTE) The Coronavirus on the Respiratory Panel, DOES NOT test for the novel  Coronavirus (2019 nCoV)    Coronavirus HKU1 NOT DETECTED NOT DETECTED Final   Coronavirus NL63 NOT DETECTED NOT DETECTED Final   Coronavirus OC43 NOT DETECTED NOT DETECTED Final   Metapneumovirus NOT DETECTED NOT DETECTED Final   Rhinovirus / Enterovirus NOT DETECTED NOT DETECTED Final   Influenza A NOT DETECTED NOT DETECTED Final   Influenza B NOT DETECTED NOT DETECTED Final   Parainfluenza Virus 1 NOT DETECTED NOT DETECTED Final   Parainfluenza Virus 2 NOT DETECTED NOT DETECTED Final   Parainfluenza Virus 3 NOT DETECTED NOT DETECTED Final   Parainfluenza Virus 4 NOT DETECTED NOT DETECTED Final   Respiratory Syncytial Virus NOT DETECTED NOT DETECTED Final   Bordetella pertussis NOT DETECTED NOT DETECTED Final   Bordetella Parapertussis NOT  DETECTED NOT DETECTED Final   Chlamydophila pneumoniae NOT DETECTED NOT DETECTED Final   Mycoplasma pneumoniae NOT DETECTED NOT DETECTED Final    Comment: Performed at [facility], [place] [address], [place], [place] [address]  Blood culture (routine x 2)     Status: None (Preliminary result)   Collection Time: 06/04/2022  5:46 PM   Specimen: BLOOD  Result Value Ref Range Status   Specimen Description   Final    BLOOD RIGHT CHEST Performed at [facility] [address], [place], [place] [address]    Special  Requests   Final    BOTTLES DRAWN AEROBIC AND ANAEROBIC Blood Culture adequate volume Performed at [facility] [address], [place], [place] [address]    Culture   Final    NO GROWTH < 12 HOURS Performed at [facility] [address], [place], [place] [address]    Report Status PENDING  Incomplete  MRSA Next Gen by PCR, Nasal     Status: None   Collection Time: 05/31/2022  9:11 PM   Specimen: Nasal Mucosa; Nasal Swab  Result Value Ref Range Status   MRSA by PCR Next Gen NOT DETECTED NOT DETECTED Final    Comment: (NOTE) The GeneXpert MRSA Assay (FDA approved for NASAL specimens only), is one component of a comprehensive MRSA colonization surveillance program. It is not intended to diagnose MRSA infection nor to guide or monitor treatment for MRSA infections. Test performance is not FDA approved in patients less than 61 years old. Performed at [facility], [place] [address], [place], [place] [address]     Radiology Studies: ECHOCARDIOGRAM COMPLETE  Result Date: 06/19/2022    ECHOCARDIOGRAM REPORT   Patient Name:   AMRON GUERRETTE Date of Exam: 06/19/2022 Medical Rec #:  [id number]          Height:       70.0 in Accession #:    [id number]         Weight:       156.7 lb Date of Birth:  1950/09/10         BSA:          1.882 m Patient Age:    52 years           BP:           143/76 mmHg Patient Gender: M                  HR:           111 bpm. Exam Location:  Inpatient Procedure: 2D Echo and Intracardiac Opacification Agent Indications:     elevated troponins  History:         Patient has no prior history of Echocardiogram examinations,                  most recent 02/24/2022. CAD, PAD; Risk Factors:Hypertension and                  Dyslipidemia.  Sonographer:     Harvie Junior Referring Phys:  [place] Diagnosing Phys: [place] IMPRESSIONS  1. Wall motion challenging to assess with tachycardia and challenging windows. Left  ventricular ejection fraction, by estimation, is 55 to 60%. The left ventricle has normal function. The left ventricle has no regional wall motion abnormalities. There is mild left ventricular hypertrophy. Left ventricular diastolic parameters are indeterminate.  2. Right ventricular systolic function is  normal. The right ventricular size is normal.  3. No evidence of mitral valve regurgitation.  4. Aortic valve regurgitation is not visualized. Aortic valve sclerosis/calcification is present, without any evidence of aortic stenosis.  5. The inferior vena cava is normal in size with greater than 50% respiratory variability, suggesting right atrial pressure of 3 mmHg. Comparison(s): No significant change from prior study. FINDINGS  Left Ventricle: Wall motion challenging to assess with tachycardia and challenging windows. Left ventricular ejection fraction, by estimation, is 55 to 60%. The left ventricle has normal function. The left ventricle has no regional wall motion abnormalities. Definity contrast agent was given IV to delineate the left ventricular endocardial borders. The left ventricular internal cavity size was normal in size. There is mild left ventricular hypertrophy. Abnormal (paradoxical) septal motion consistent with post-operative status. Left ventricular diastolic parameters are indeterminate. Right Ventricle: The right ventricular size is normal. Right ventricular systolic function is normal. Left Atrium: Left atrial size was normal in size. Right Atrium: Right atrial size was normal in size. Pericardium: There is no evidence of pericardial effusion. Mitral Valve: No evidence of mitral valve regurgitation. Tricuspid Valve: Tricuspid valve regurgitation is mild. Aortic Valve: Aortic valve regurgitation is not visualized. Aortic valve sclerosis/calcification is present, without any evidence of aortic stenosis. Aortic valve mean gradient measures 4.0 mmHg. Aortic valve peak gradient measures 7.1 mmHg.  Aortic valve  area, by VTI measures 2.47 cm. Pulmonic Valve: Pulmonic valve regurgitation is not visualized. Venous: The inferior vena cava is normal in size with greater than 50% respiratory variability, suggesting right atrial pressure of 3 mmHg. IAS/Shunts: The interatrial septum was not well visualized.  LEFT VENTRICLE PLAX 2D LVIDd:         4.20 cm     Diastology LVIDs:         2.70 cm     LV e' medial:    12.10 cm/s LV PW:         0.90 cm     LV E/e' medial:  6.6 LV IVS:        1.00 cm     LV e' lateral:   10.90 cm/s LVOT diam:     2.20 cm     LV E/e' lateral: 7.3 LV SV:         47 LV SV Index:   25 LVOT Area:     3.80 cm  LV Volumes (MOD) LV vol d, MOD A2C: 97.0 ml LV vol d, MOD A4C: 91.8 ml LV vol s, MOD A2C: 40.7 ml LV vol s, MOD A4C: 44.3 ml LV SV MOD A2C:     56.3 ml LV SV MOD A4C:     91.8 ml LV SV MOD BP:      52.8 ml RIGHT VENTRICLE RV Basal diam:  4.20 cm RV Mid diam:    3.30 cm RV S prime:     12.60 cm/s TAPSE (M-mode): 1.9 cm LEFT ATRIUM             Index        RIGHT ATRIUM           Index LA Vol (A2C):   29.0 ml 15.41 ml/m  RA Area:     15.60 cm LA Vol (A4C):   30.6 ml 16.26 ml/m  RA Volume:   35.30 ml  18.76 ml/m LA Biplane Vol: 32.3 ml 17.16 ml/m  AORTIC VALVE  PULMONIC VALVE AV Area (Vmax):    3.00 cm     PV Vmax:       0.88 m/s AV Area (Vmean):   2.73 cm     PV Peak grad:  3.1 mmHg AV Area (VTI):     2.47 cm AV Vmax:           133.00 cm/s AV Vmean:          89.600 cm/s AV VTI:            0.191 m AV Peak Grad:      7.1 mmHg AV Mean Grad:      4.0 mmHg LVOT Vmax:         105.00 cm/s LVOT Vmean:        64.300 cm/s LVOT VTI:          0.124 m LVOT/AV VTI ratio: 0.65  AORTA Ao Root diam: 3.70 cm Ao Asc diam:  3.40 cm MITRAL VALVE                TRICUSPID VALVE MV Area (PHT): 5.06 cm     TR Peak grad:   42.0 mmHg MV Decel Time: 150 msec     TR Vmax:        324.00 cm/s MV E velocity: 80.10 cm/s MV A velocity: 114.00 cm/s  SHUNTS MV E/A ratio:  0.70         Systemic VTI:   0.12 m                             Systemic Diam: 2.20 cm Phineas Inches Electronically signed by Phineas Inches Signature Date/Time: 06/19/2022/12:17:33 PM    Final (Updated)    ECHOCARDIOGRAM COMPLETE  Result Date: 06/19/2022    ECHOCARDIOGRAM REPORT   Patient Name:   KENI ELISON Date of Exam: 06/19/2022 Medical Rec #:  [id number]          Height:       70.0 in Accession #:    [id number]         Weight:       156.7 lb Date of Birth:  12/04/1950         BSA:          1.882 m Patient Age:    45 years           BP:           143/76 mmHg Patient Gender: M                  HR:           111 bpm. Exam Location:  Inpatient Procedure: 2D Echo and Intracardiac Opacification Agent Indications:    elevated troponins  History:        Patient has no prior history of Echocardiogram examinations,                 most recent 02/24/2022. CAD, PAD; Risk Factors:Hypertension and                 Dyslipidemia.  Sonographer:    Harvie Junior Referring Phys: [place]  1. Wall motion challenging to assess with tachycardia and challenging windows. Left ventricular ejection fraction, by estimation, is 55 to 60%. The left ventricle has normal function. The left ventricle has no regional wall motion abnormalities. There is mild left ventricular hypertrophy. Left ventricular diastolic parameters are indeterminate.  2. Right  ventricular systolic function is normal. The right ventricular size is normal.  3. No evidence of mitral valve regurgitation.  4. Aortic valve regurgitation is not visualized. Aortic valve sclerosis/calcification is present, without any evidence of aortic stenosis.  5. The inferior vena cava is normal in size with greater than 50% respiratory variability, suggesting right atrial pressure of 3 mmHg. Comparison(s): No significant change from prior study. FINDINGS  Left Ventricle: Wall motion challenging to assess with tachycardia and challenging windows. Left ventricular ejection fraction, by  estimation, is 55 to 60%. The left ventricle has normal function. The left ventricle has no regional wall motion abnormalities. Definity contrast agent was given IV to delineate the left ventricular endocardial borders. The left ventricular internal cavity size was normal in size. There is mild left ventricular hypertrophy. Abnormal (paradoxical) septal motion consistent with post-operative status. Left ventricular diastolic parameters are indeterminate. Right Ventricle: The right ventricular size is normal. Right ventricular systolic function is normal. Left Atrium: Left atrial size was normal in size. Right Atrium: Right atrial size was normal in size. Pericardium: There is no evidence of pericardial effusion. Mitral Valve: No evidence of mitral valve regurgitation. Tricuspid Valve: Tricuspid valve regurgitation is mild. Aortic Valve: Aortic valve regurgitation is not visualized. Aortic valve sclerosis/calcification is present, without any evidence of aortic stenosis. Aortic valve mean gradient measures 4.0 mmHg. Aortic valve peak gradient measures 7.1 mmHg. Aortic valve  area, by VTI measures 2.47 cm. Pulmonic Valve: Pulmonic valve regurgitation is not visualized. Venous: The inferior vena cava is normal in size with greater than 50% respiratory variability, suggesting right atrial pressure of 3 mmHg. IAS/Shunts: The interatrial septum was not well visualized.  LEFT VENTRICLE PLAX 2D LVIDd:         4.20 cm     Diastology LVIDs:         2.70 cm     LV e' medial:    12.10 cm/s LV PW:         0.90 cm     LV E/e' medial:  6.6 LV IVS:        1.00 cm     LV e' lateral:   10.90 cm/s LVOT diam:     2.20 cm     LV E/e' lateral: 7.3 LV SV:         47 LV SV Index:   25 LVOT Area:     3.80 cm  LV Volumes (MOD) LV vol d, MOD A2C: 97.0 ml LV vol d, MOD A4C: 91.8 ml LV vol s, MOD A2C: 40.7 ml LV vol s, MOD A4C: 44.3 ml LV SV MOD A2C:     56.3 ml LV SV MOD A4C:     91.8 ml LV SV MOD BP:      52.8 ml RIGHT VENTRICLE RV Basal  diam:  4.20 cm RV Mid diam:    3.30 cm RV S prime:     12.60 cm/s TAPSE (M-mode): 1.9 cm LEFT ATRIUM             Index        RIGHT ATRIUM           Index LA Vol (A2C):   29.0 ml 15.41 ml/m  RA Area:     15.60 cm LA Vol (A4C):   30.6 ml 16.26 ml/m  RA Volume:   35.30 ml  18.76 ml/m LA Biplane Vol: 32.3 ml 17.16 ml/m  AORTIC VALVE  PULMONIC VALVE AV Area (Vmax):    3.00 cm     PV Vmax:       0.88 m/s AV Area (Vmean):   2.73 cm     PV Peak grad:  3.1 mmHg AV Area (VTI):     2.47 cm AV Vmax:           133.00 cm/s AV Vmean:          89.600 cm/s AV VTI:            0.191 m AV Peak Grad:      7.1 mmHg AV Mean Grad:      4.0 mmHg LVOT Vmax:         105.00 cm/s LVOT Vmean:        64.300 cm/s LVOT VTI:          0.124 m LVOT/AV VTI ratio: 0.65  AORTA Ao Root diam: 3.70 cm Ao Asc diam:  3.40 cm MITRAL VALVE                TRICUSPID VALVE MV Area (PHT): 5.06 cm     TR Peak grad:   42.0 mmHg MV Decel Time: 150 msec     TR Vmax:        324.00 cm/s MV E velocity: 80.10 cm/s MV A velocity: 114.00 cm/s  SHUNTS MV E/A ratio:  0.70         Systemic VTI:  0.12 m                             Systemic Diam: 2.20 cm Phineas Inches Electronically signed by Phineas Inches Signature Date/Time: 06/19/2022/12:17:33 PM    Final    VAS [place] LOWER EXTREMITY VENOUS (DVT)  Result Date: 06/19/2022  Lower Venous DVT Study Patient Name:  MALLORY ENRIQUES  Date of Exam:   06/19/2022 Medical Rec #: [id number]           Accession #:    [id number] Date of Birth: 05-23-1951          Patient Gender: M Patient Age:   34 years Exam Location:  [facility] Procedure:      VAS [place] LOWER EXTREMITY VENOUS (DVT) Referring Phys: Adrian Prows --------------------------------------------------------------------------------  Indications: Swelling.  Risk Factors: Cancer. Limitations: Poor ultrasound/tissue interface. Comparison Study: No prior studies. Performing Technologist: Oliver Hum RVT  Examination Guidelines: A complete evaluation  includes B-mode imaging, spectral Doppler, color Doppler, and power Doppler as needed of all accessible portions of each vessel. Bilateral testing is considered an integral part of a complete examination. Limited examinations for reoccurring indications may be performed as noted. The reflux portion of the exam is performed with the patient in reverse Trendelenburg.  +---------+---------------+---------+-----------+----------+--------------+ RIGHT    CompressibilityPhasicitySpontaneityPropertiesThrombus Aging +---------+---------------+---------+-----------+----------+--------------+ CFV      Full           Yes      Yes                                 +---------+---------------+---------+-----------+----------+--------------+ SFJ      Full                                                        +---------+---------------+---------+-----------+----------+--------------+  FV Prox  Full                                                        +---------+---------------+---------+-----------+----------+--------------+ FV Mid   Full           Yes      Yes                                 +---------+---------------+---------+-----------+----------+--------------+ FV DistalFull                                                        +---------+---------------+---------+-----------+----------+--------------+ PFV      Full                                                        +---------+---------------+---------+-----------+----------+--------------+ POP      Full           Yes      Yes                                 +---------+---------------+---------+-----------+----------+--------------+ PTV      Full                                                        +---------+---------------+---------+-----------+----------+--------------+ PERO     Partial                                      Acute           +---------+---------------+---------+-----------+----------+--------------+   +---------+---------------+---------+-----------+----------+--------------+ LEFT     CompressibilityPhasicitySpontaneityPropertiesThrombus Aging +---------+---------------+---------+-----------+----------+--------------+ CFV      Full           Yes      Yes                                 +---------+---------------+---------+-----------+----------+--------------+ SFJ      Full                                                        +---------+---------------+---------+-----------+----------+--------------+ FV Prox  Full                                                        +---------+---------------+---------+-----------+----------+--------------+  FV Mid   Full           Yes      Yes                                 +---------+---------------+---------+-----------+----------+--------------+ FV DistalFull           Yes      Yes                                 +---------+---------------+---------+-----------+----------+--------------+ PFV      Full                                                        +---------+---------------+---------+-----------+----------+--------------+ POP      None           No       No                                  +---------+---------------+---------+-----------+----------+--------------+ PTV      Full                                                        +---------+---------------+---------+-----------+----------+--------------+ PERO     Full                                                        +---------+---------------+---------+-----------+----------+--------------+    Summary: RIGHT: - Findings consistent with acute deep vein thrombosis involving the right peroneal veins. - No cystic structure found in the popliteal fossa.  LEFT: - Findings consistent with acute deep vein thrombosis involving the left popliteal vein. - No cystic  structure found in the popliteal fossa.  *See table(s) above for measurements and observations.    Preliminary    DG Chest Portable 1 View  Result Date: 06/02/2022 CLINICAL DATA:  Per chart: Patient has shortness of breath and feeling weak where he cannot walk the past 2 days. He received a CT scan yesterday that showed an infection. Started doxycycline. Patient has lung cancer stage 3, currently getting chemo. EXAM: PORTABLE CHEST - 1 VIEW COMPARISON:  05/20/2022 FINDINGS: Stable right IJ port to the proximal right atrium. Worsening coarse interstitial throughout both lungs, with some coarse airspace opacities throughout the right upper lung. Heart size and mediastinal contours are within normal limits. CABG markers. Blunting of the right lateral costophrenic angle suggesting small effusion. Sternotomy wires.  Cervical fixation hardware partially visualized. IMPRESSION: 1. Worsening coarse interstitial and right upper lung airspace opacities. 2. Small right effusion. Electronically Signed   By: Lucrezia Europe M.D.   On: 05/31/2022 19:33    Scheduled Meds:  aspirin EC  81 mg Oral Daily   brimonidine  2 drop Both Eyes BID   Chlorhexidine Gluconate Cloth  6 each Topical Daily   cilostazol  100 mg Oral BID   fenofibrate  160 mg Oral Daily   insulin aspart  0-9 Units Subcutaneous Q4H   ipratropium-albuterol  3 mL Nebulization QID   isosorbide-hydrALAZINE  1 tablet Oral TID   levothyroxine  50 mcg Oral QAC breakfast   magnesium chloride  1 tablet Oral Daily   methylPREDNISolone (SOLU-MEDROL) injection  125 mg Intravenous Daily   metoprolol succinate  25 mg Oral Daily   mirtazapine  7.5 mg Oral QHS   pantoprazole  40 mg Oral Daily   potassium chloride  10 mEq Oral Q1 Hr x 3   potassium chloride  10 mEq Oral BID   rosuvastatin  20 mg Oral Daily   sodium chloride flush  10-40 mL Intracatheter Q12H   torsemide  20 mg Oral BID   Continuous Infusions:  ceFEPime (MAXIPIME) IV Stopped (06/19/22 0809)    heparin 1,000 Units/hr (06/19/22 0958)    LOS: 1 day   Raiford Noble, DO [facility] Available via Epic secure chat 7am-7pm After these hours, please refer to coverage provider listed on [email or web address] 06/19/2022, 2:22 PM

## 2022-06-19 NOTE — Final Consult Note (Signed)
CARDIOLOGY CONSULT NOTE  Patient ID: BRENTTON WARDLOW MRN: [id number] DOB/AGE: 1951-02-07 71 y.o.  Admit date: 06/11/2022 Referring [place], [place], [place] Primary Physician:  [name], MD Reason for Consultation  Shortness of breath, abnormal cardiac markers  Patient ID: GAUTHAM HEWINS, male    DOB: Jul 22, 1951, 71 y.o.   MRN: [id number]  Chief Complaint  Patient presents with  . Shortness of Breath   HPI:    YVAN DORITY  is a 71 y.o. coronary artery disease status post CABG 3 in 12/2015, known peripheral artery disease (angiography in 2015 revealing bilateral SFA cauliflower-like severe diffuse mixed atheresclerotic lesions with bilateral severe disease in the anterior and peroneal arteries), type II diagnosed mellitus controlled with stage IV chronic kidney disease, mixed hyperlipidemia and hypertension.   Diagnosed with stage IIIb non-small cell lung cancer in May 2023.  He underwent chemoradiation therapy and also immunotherapy which has been on hold due to patient's acute illness.  He is now admitted to the hospital with acute respiratory distress, probable multilobar pneumonia, probable UTI and severe hypoxemia and presented to the emergency room where he was started on therapy with oxygen supplementation and antibiotics and is now being treated for possible pneumonia and elevated serum troponin, was also started on IV heparin.  His wife is present at the bedside.  Patient has been gradually getting worse over the last several weeks with continued weight gain.  He has been careful with his diet.  He is also noticed bilateral leg edema, abdominal distention and marked dyspnea.  Over the past 2 to 3 days, he has not been able to do anything much at home, previously used to walk with a help of walker.  He has also developed marked generalized weakness in which his wife has to help him out of a chair or even out of the car.  He had routine CT scan of the chest  done yesterday which had revealed multilobar pneumonia, last week was also diagnosed with UTI, with worsening symptoms he presented to the ED.  Denies chest pain, palpitations, fever or chills.  Past Medical History:  Diagnosis Date  . Arthritis   . ASHD (arteriosclerotic heart disease)   . Atherosclerosis of native arteries of the extremities with intermittent claudication   . Cataract   . Coronary atherosclerosis of native coronary artery   . Diabetes mellitus    NIDDM  . Dyslipidemia   . ED (erectile dysfunction)   . Full dentures   . Glaucoma associated with ocular disorder, mild stage    bilateral  . Hyperlipidemia   . Hypertension    under control, has been on med. > 9 yr.  . Impotence of organic origin   . Meniscus tear 05/2012   right knee  . Myocardial infarction ([place]) 10/2002  . Stented coronary artery    Past Surgical History:  Procedure Laterality Date  . ANTERIOR CERVICAL DECOMP/DISCECTOMY FUSION N/A 05/07/2018   Procedure: ANTERIOR CERVICAL DECOMPRESSION/DISCECTOMY FUSION CERVICAL FIVE - CERVICAL SIX;  Surgeon: Consuella Lose, MD;  Location: [place];  Service: Neurosurgery;  Laterality: N/A;  ANTERIOR CERVICAL DECOMPRESSION/DISCECTOMY FUSION CERVICAL FIVE - CERVICAL SIX  . BRONCHIAL BIOPSY  12/04/2021   Procedure: BRONCHIAL BIOPSIES;  Surgeon: Collene Gobble, MD;  Location: [facility] ENDOSCOPY;  Service: Cardiopulmonary;;  . BRONCHIAL BRUSHINGS  12/04/2021   Procedure: BRONCHIAL BRUSHINGS;  Surgeon: Collene Gobble, MD;  Location: [facility] ENDOSCOPY;  Service: Cardiopulmonary;;  . BRONCHIAL NEEDLE ASPIRATION BIOPSY  12/04/2021  Procedure: BRONCHIAL NEEDLE ASPIRATION BIOPSIES;  Surgeon: Collene Gobble, MD;  Location: [facility] ENDOSCOPY;  Service: Cardiopulmonary;;  . CARDIAC CATHETERIZATION  10/23/2002   had 1 stent placed then-- by Dr. Melvern Banker  . CARDIAC CATHETERIZATION N/A 12/21/2015   Procedure: Left Heart Cath and Coronary Angiography;  Surgeon: Adrian Prows, MD;  Location: [place] CV LAB;  Service: Cardiovascular;  Laterality: N/A;  . CATARACT EXTRACTION Right 05/2017  . CATARACT EXTRACTION Left 07/2017  . COLONOSCOPY  10/08/2010   Dr.Patterson  . CORONARY ARTERY BYPASS GRAFT N/A 01/02/2016   Procedure: CORONARY ARTERY BYPASS GRAFTING (CABG) TIMES 3 USING LEFT INTERNAL MAMMARY ARTERY AND RIGHT SAPHENOUS LEG VEIN HARVESTED ENDOSCOPICALLY;  Surgeon: Ivin Poot, MD;  Location: [place];  Service: Open Heart Surgery;  Laterality: N/A;  . EYE SURGERY    . INSERTION OF MESH N/A 05/06/2016   Procedure: INSERTION OF MESH;  Surgeon: Georganna Skeans, MD;  Location: [place];  Service: General;  Laterality: N/A;  . IR IMAGING GUIDED PORT INSERTION  04/12/2022  . KNEE ARTHROSCOPY WITH MEDIAL MENISECTOMY  06/04/2012   Procedure: KNEE ARTHROSCOPY WITH MEDIAL MENISECTOMY;  Surgeon: Ninetta Lights, MD;  Location: [place];  Service: Orthopedics;  Laterality: Right;  RIGHT SCOPE MEDIAL MENISCECTOMY, CHONDROPLASTY, EXCISION LOOSE BODY  . LOWER EXTREMITY ANGIOGRAM N/A 11/23/2013   Procedure: LOWER EXTREMITY ANGIOGRAM;  Surgeon: Laverda Page, MD;  Location: [facility] CATH LAB;  Service: Cardiovascular;  Laterality: N/A;  . NECK SURGERY N/A    02/19/2018  . PERIPHERAL VASCULAR CATHETERIZATION N/A 12/21/2015   Procedure: Abdominal Aortogram;  Surgeon: Adrian Prows, MD;  Location: [place] CV LAB;  Service: Cardiovascular;  Laterality: N/A;  . PERIPHERAL VASCULAR CATHETERIZATION N/A 12/21/2015   Procedure: Abdominal Aortogram w/Lower Extremity;  Surgeon: Adrian Prows, MD;  Location: [place] CV LAB;  Service: Cardiovascular;  Laterality: N/A;  . TEE WITHOUT CARDIOVERSION N/A 01/02/2016   Procedure: TRANSESOPHAGEAL ECHOCARDIOGRAM (TEE);  Surgeon: Ivin Poot, MD;  Location: [place];  Service: Open Heart Surgery;  Laterality: N/A;  . UMBILICAL HERNIA REPAIR N/A 05/06/2016   Procedure: HERNIA REPAIR UMBILICAL ADULT;  Surgeon: Georganna Skeans, MD;  Location: [place];   Service: General;  Laterality: N/A;  . VIDEO BRONCHOSCOPY N/A 12/04/2021   Procedure: VIDEO BRONCHOSCOPY WITH FLUORO;  Surgeon: Collene Gobble, MD;  Location: [place];  Service: Cardiopulmonary;  Laterality: N/A;   Social History   Tobacco Use  . Smoking status: Former    Packs/day: 2.50    Years: 35.00    Total pack years: 87.50    Types: Cigarettes    Quit date: 12/02/2002    Years since quitting: 19.5  . Smokeless tobacco: Never  Substance Use Topics  . Alcohol use: Not Currently    Comment: rare    Family History  Problem Relation Age of Onset  . Bone cancer Mother   . Pneumonia Father   . Diabetes Brother   . Heart disease Brother        Pacemaker  . COPD Brother   . Colon cancer Neg Hx   . Prostate cancer Neg Hx   . Esophageal cancer Neg Hx   . Stomach cancer Neg Hx     Marital Status: Married  ROS  Review of Systems  Constitutional: Positive for malaise/fatigue and weight gain. Negative for chills, decreased appetite and diaphoresis.  Cardiovascular:  Positive for dyspnea on exertion and leg swelling. Negative for chest pain.  Respiratory:  Positive for cough.  Musculoskeletal:  Positive for muscle weakness.  Gastrointestinal:  Positive for bloating. Negative for melena.  Neurological:  Positive for weakness.   Objective      06/19/2022    9:00 AM 06/19/2022    8:00 AM 06/19/2022    5:09 AM  Vitals with BMI  Systolic 784 696 295  Diastolic 82 87 82  Pulse  111 92    Blood pressure (!) 164/82, pulse (!) 111, temperature 99.1 F (37.3 C), temperature source Axillary, resp. rate (!) 30, weight 71.1 kg, SpO2 93 %.    Physical Exam Constitutional:      General: He is in acute distress.     Appearance: He is ill-appearing.  Neck:     Vascular: JVD present. No carotid bruit.  Cardiovascular:     Rate and Rhythm: Normal rate and regular rhythm.     Pulses:          Dorsalis pedis pulses are 0 on the right side and 0 on the left side.        Posterior tibial pulses are 0 on the right side and 0 on the left side.     Heart sounds: Normal heart sounds. No murmur heard.    No gallop.  Pulmonary:     Effort: Pulmonary effort is normal.     Breath sounds: Examination of the right-lower field reveals decreased breath sounds. Examination of the left-lower field reveals decreased breath sounds. Decreased breath sounds and rales (Diffuse, bilateral, no wheezing.) present.  Abdominal:     General: Bowel sounds are normal. There is distension.     Palpations: Abdomen is soft.     Tenderness: There is no abdominal tenderness.  Musculoskeletal:     Right lower leg: Edema (2+ pitting, nontender, below knee) present.     Left lower leg: Edema (2+ pitting, nontender below knee) present.  Skin:    Coloration: Skin is pale.  Neurological:     Mental Status: He is alert.    Laboratory examination:   Recent Labs    05/22/22 0934 05/27/2022 1705 06/19/22 0515 06/19/22 0825  NA 135 132*  --  137  K 4.8 3.1*  --  3.3*  CL 105 100  --  104  CO2 23 23  --  21*  GLUCOSE 97 201*  --  101*  BUN 96* 57*  --  51*  CREATININE 3.06* 2.42* 2.27* 2.25*  CALCIUM 8.3* 7.5*  --  7.6*  GFRNONAA 21* 28* 30* 30*   estimated creatinine clearance is 29.4 mL/min (A) (by C-G formula based on SCr of 2.25 mg/dL (H)).     Latest Ref Rng & Units 06/19/2022    8:25 AM 06/19/2022    5:15 AM 05/26/2022    5:05 PM  CMP  Glucose 70 - 99 mg/dL 101   201   BUN 8 - 23 mg/dL 51   57   Creatinine 0.61 - 1.24 mg/dL 2.25  2.27  2.42   Sodium 135 - 145 mmol/L 137   132   Potassium 3.5 - 5.1 mmol/L 3.3   3.1   Chloride 98 - 111 mmol/L 104   100   CO2 22 - 32 mmol/L 21   23   Calcium 8.9 - 10.3 mg/dL 7.6   7.5   Total Protein 6.5 - 8.1 g/dL 5.2   5.5   Total Bilirubin 0.3 - 1.2 mg/dL 0.7   0.8   Alkaline Phos 38 - 126 U/L 51   52  AST 15 - 41 U/L 54   43   ALT 0 - 44 U/L 20   21       Latest Ref Rng & Units 06/19/2022    8:25 AM 06/19/2022    5:15 AM  06/09/2022    5:05 PM  CBC  WBC 4.0 - 10.5 K/uL 5.5  5.4  4.5   Hemoglobin 13.0 - 17.0 g/dL 9.0  8.4  8.6   Hematocrit 39.0 - 52.0 % 27.7  26.2  25.9   Platelets 150 - 400 K/uL 204  193  190    Lipid Panel Recent Labs    10/29/21 1016  CHOL 129  TRIG 80.0  LDLCALC 58  VLDL 16.0  HDL 54.70  CHOLHDL 2    HEMOGLOBIN A1C Lab Results  Component Value Date   HGBA1C 6.6 (H) 02/22/2022   MPG 142.72 02/22/2022   TSH Recent Labs    04/24/22 0947 05/22/22 0934 05/31/2022 2116  TSH 6.297* 5.087* 1.560   BNP (last 3 results) Recent Labs    02/22/22 0810 06/02/2022 1705  BNP 65.3 544.9*   Cardiac Panel (last 3 results) Recent Labs    06/08/2022 1705 06/03/2022 2230 06/19/22 0215 06/19/22 0515 06/19/22 0825  CKTOTAL 63  --   --   --   --   TROPONINIHS 2,602*   < > 2,990* 2,574* 2,623*   < > = values in this interval not displayed.     Medications and allergies   Allergies  Allergen Reactions  . Metformin And Related     Held due to creatinine elevation September 2023.  . Budesonide Rash    Drug rash with oral [place]      Current Meds  Medication Sig  . albuterol (VENTOLIN HFA) 108 (90 Base) MCG/ACT inhaler Inhale 1-2 puffs into the lungs every 6 (six) hours as needed (for cough.  okay to fill with albuterol/proair/ventolin.). (Patient taking differently: Inhale 1-2 puffs into the lungs every 6 (six) hours as needed for shortness of breath.)  . aspirin EC 81 MG tablet Take 1 tablet (81 mg total) by mouth daily. Okay to restart this medication on 12/05/2021.  . brimonidine (ALPHAGAN P) 0.1 % SOLN Place 2 drops into both eyes 2 (two) times daily.  . cilostazol (PLETAL) 100 MG tablet TAKE 1 TABLET BY MOUTH TWICE A DAY  . Cyanocobalamin (VITAMIN B12 PO) Take 1 tablet by mouth in the morning and at bedtime.  . dorzolamide-timolol (COSOPT) 22.3-6.8 MG/ML ophthalmic solution Place 1 drop into both eyes 2 (two) times daily.  Marland Kitchen doxycycline (VIBRA-TABS) 100 MG tablet Take 1  tablet (100 mg total) by mouth 2 (two) times daily.  Marland Kitchen FeFum-FePoly-FA-B Cmp-C-Biot (FOLIVANE-PLUS) CAPS TAKE 1 CAPSULE BY MOUTH EVERY DAY IN THE MORNING (Patient taking differently: Take 1 capsule by mouth daily.)  . fenofibrate (TRICOR) 145 MG tablet TAKE 1 TABLET BY MOUTH EVERY DAY IN THE EVENING (Patient taking differently: Take 145 mg by mouth daily.)  . HYDROcodone bit-homatropine (HYCODAN) 5-1.5 MG/5ML syrup Take 5 mLs by mouth every 8 (eight) hours as needed for cough (sedation caution.).  Marland Kitchen ipratropium-albuterol (DUONEB) 0.5-2.5 (3) MG/3ML SOLN Take 3 mLs by nebulization every 6 (six) hours as needed. (Patient taking differently: Take 3 mLs by nebulization every 6 (six) hours as needed (for shortness of breath).)  . levothyroxine (SYNTHROID) 50 MCG tablet TAKE 1 TABLET BY MOUTH EVERY DAY BEFORE BREAKFAST (Patient taking differently: Take 50 mcg by mouth daily before breakfast.)  . Magnesium Oxide  250 MG TABS Take 1 tablet (250 mg total) by mouth daily.  . metoprolol tartrate (LOPRESSOR) 25 MG tablet Take 25 mg by mouth 2 (two) times daily.  . mirtazapine (REMERON) 7.5 MG tablet TAKE 1 TABLET BY MOUTH AT BEDTIME.  Marland Kitchen Netarsudil-Latanoprost (ROCKLATAN) 0.02-0.005 % SOLN Place 1 drop into both eyes at bedtime.  . nitroGLYCERIN (NITROSTAT) 0.4 MG SL tablet Place 1 tablet (0.4 mg total) under the tongue every 5 (five) minutes as needed for chest pain.  Marland Kitchen omega-3 acid ethyl esters (LOVAZA) 1 g capsule TAKE 1 CAPSULE BY MOUTH TWICE A DAY (Patient taking differently: Take 1 g by mouth 2 (two) times daily.)  . pantoprazole (PROTONIX) 40 MG tablet Take 40 mg by mouth daily.  . potassium chloride 20 MEQ/15ML (10%) SOLN TAKE 30 MLS (40 MEQ TOTAL) BY MOUTH 2 (TWO) TIMES DAILY.  Marland Kitchen predniSONE (DELTASONE) 20 MG tablet Take 2 a day for 5 days, then 1 a day for 5 days, with food. Don't take with aleve/ibuprofen. (Patient taking differently: Take 40 mg by mouth daily with breakfast.)  . prochlorperazine  (COMPAZINE) 10 MG tablet TAKE 1 TABLET BY MOUTH EVERY 6 HOURS AS NEEDED FOR NAUSEA OR VOMITING. (Patient taking differently: Take 10 mg by mouth every 6 (six) hours as needed for nausea or vomiting.)  . rosuvastatin (CRESTOR) 20 MG tablet TAKE 1 TABLET BY MOUTH EVERY DAY (Patient taking differently: Take 20 mg by mouth daily.)  . torsemide (DEMADEX) 20 MG tablet Take 2 tablets (40 mg total) by mouth 2 (two) times daily. (Patient taking differently: Take 40 mg by mouth daily as needed (Fluid).)  . triamcinolone cream (KENALOG) 0.1 % Apply 1 Application topically 2 (two) times daily. (Patient taking differently: Apply 1 Application topically daily as needed (For rash).)    Scheduled Meds: . aspirin EC  81 mg Oral Daily  . brimonidine  2 drop Both Eyes BID  . Chlorhexidine Gluconate Cloth  6 each Topical Daily  . cilostazol  100 mg Oral BID  . fenofibrate  160 mg Oral Daily  . insulin aspart  0-9 Units Subcutaneous Q4H  . ipratropium-albuterol  3 mL Nebulization QID  . levothyroxine  50 mcg Oral QAC breakfast  . mirtazapine  7.5 mg Oral QHS  . predniSONE  40 mg Oral Q breakfast  . rosuvastatin  20 mg Oral Daily  . sodium chloride flush  10-40 mL Intracatheter Q12H   Continuous Infusions: . ceFEPime (MAXIPIME) IV Stopped (06/19/22 0809)  . heparin 1,000 Units/hr (06/19/22 0958)  . vancomycin     PRN Meds:.acetaminophen **OR** acetaminophen, albuterol, HYDROcodone-acetaminophen, ipratropium-albuterol, sodium chloride flush   No intake/output data recorded. Total I/O In: 1006 [IV Piggyback:1006] Out: -   Net IO Since Admission: 1,006.03 mL [06/19/22 1033]   Radiology:   DG Chest Portable 1 View  Result Date: 06/12/2022 CLINICAL DATA:  Per chart: Patient has shortness of breath and feeling weak where he cannot walk the past 2 days. He received a CT scan yesterday that showed an infection. Started doxycycline. Patient has lung cancer stage 3, currently getting chemo. EXAM: PORTABLE  CHEST - 1 VIEW COMPARISON:  05/20/2022 FINDINGS: Stable right IJ port to the proximal right atrium. Worsening coarse interstitial throughout both lungs, with some coarse airspace opacities throughout the right upper lung. Heart size and mediastinal contours are within normal limits. CABG markers. Blunting of the right lateral costophrenic angle suggesting small effusion. Sternotomy wires.  Cervical fixation hardware partially visualized. IMPRESSION: 1. Worsening coarse interstitial  and right upper lung airspace opacities. 2. Small right effusion. Electronically Signed   By: Lucrezia Europe M.D.   On: 06/09/2022 19:33    Cardiac Studies:   Lexiscan Myoview Stress Test 03/22/2019: 1. Stress EKG is non-diagnostic, as this is pharmacological stress test. Myocardial perfusion imaging is normal. Left ventricular ejection fraction is  66% with normal wall motion. Low risk study. 2. Compared to the study done on 12/08/2015, inferior ischemia on the present.  Echocardiogram 02/24/2022:   1. Left ventricular ejection fraction, by estimation, is 55 to 60%. The left ventricle has normal function. The left ventricle has no regional wall motion abnormalities. There is mild concentric left ventricular hypertrophy. Left ventricular diastolic parameters are consistent with Grade I diastolic dysfunction (impaired relaxation).  2. Right ventricular systolic function is normal. The right ventricular size is normal. There is normal pulmonary artery systolic pressure.  3. The mitral valve is normal in structure. No evidence of mitral valve regurgitation. No evidence of mitral stenosis.  4. The aortic valve is tricuspid. Aortic valve regurgitation is not visualized. Aortic valve sclerosis is present, with no evidence of aortic valve stenosis.  5. The inferior vena cava is normal in size with greater than 50% respiratory variability, suggesting right atrial pressure of 3 mmHg.  EKG:  EKG 06/19/2022: Sinus tachycardia at rate of  125 bpm, normal axis, T wave inversion V1 to V3, new from prior EKG.  May suggest ischemia versus RV strain.  No ST segment changes of ischemia.  Low-voltage complexes.  Telemetry 06/19/2022: Rare PVCs otherwise sinus tachycardia.  Assessment   1.  Acute hypoxemic respiratory failure secondary to complaint of pulmonary fibrosis, probable pneumonia, acute on chronic diastolic heart failure. 2.  Abnormal EKG and abnormal serum troponin.  Serum troponins have been flat around 3000, this may suggest either a cardiomyopathy versus with the EKG abnormality, acute respiratory distress, history of malignancy, low physical activity, pulmonary embolism is also highly likely. 3.  Chronic stage IV kidney disease prohibiting use of contrast to evaluate for PE.  Serum creatinine has remained stable. 4.  Acute on chronic diastolic heart failure, patient is volume overloaded with elevated JVD in spite of respiratory distress able to make this out, also has ascites on physical exam and 2+ bilateral below-knee pitting leg edema.  Could not make out organomegaly due to obesity and distended abdomen.  Recommendations:   CONSUELO THAYNE is a 71 y.o. coronary artery disease status post CABG 3 in 12/2015, known peripheral artery disease (angiography in 2015 revealing bilateral SFA cauliflower-like severe diffuse mixed atheresclerotic lesions with bilateral severe disease in the anterior and peroneal arteries), type II diagnosed mellitus controlled with stage IV chronic kidney disease, mixed hyperlipidemia and hypertension.   Diagnosed with stage IIIb non-small cell lung cancer in May 2023.  He underwent chemoradiation therapy and also immunotherapy which has been on hold due to patient's acute illness.  He is now admitted to the hospital with acute respiratory distress, probable multilobar pneumonia, probable UTI and severe hypoxemia and presented to the emergency room.  I would start the patient on torsemide 20 mg  twice daily and follow-up on his urine output closely and also daily monitoring of his weight.  Would follow-up on BNP serially over the next 2 to 3 days.  Not on ACE inhibitors or ARB due to renal failure.  His blood pressure is elevated, I will also start him on BiDil 1 p.o. 3 times daily, and low-dose of beta-blocker with metoprolol succinate  25 mg daily.  Will follow-up on echocardiogram.  Continue IV heparin for now, will wait for echocardiogram, lower extremity venous duplex ordered to exclude DVT.  If DVT is present, would probably treat him as acute PE as well if negative, could consider VQ scan although may have artifact, perfusion defect may at least give [place] an indication if there is a large or a moderate-sized PE.  He has low potassium, I have repleted the potassium as well.  Orders placed today by me: Started BiDil 1 p.o. 3 times daily with parameters to hold for SBP <130 mmHg, metoprolol succinate 25 mg daily, torsemide 20 mg p.o. twice daily, potassium 10 mEq every hour x 3 and also 10 mEq twice daily, strict I's and O's, daily weights.  I have discussed my findings, my thought process with Dr. Erskine Emery, pulmonary critical care.  This was a 1 hour critical care consult, I spent 45 minutes in evaluation of patient's acute life-threatening illness, review of charts and communication.   Adrian Prows, MD, [facility] 06/19/2022, 10:33 AM Office: [phone]

## 2022-06-19 NOTE — ED Notes (Signed)
Pt wanted to be placed back on nonrebreather, states he feels like he breathes better with this on. This RN placed NRB back on pt. Pt resting comfortably at this time

## 2022-06-19 NOTE — ED Notes (Signed)
Gave pt his morning medicines. Crushed and placed in apple sauce. He took off his mask to take these and O2 dropped down into the 70s. Pt got very short of breath while taking these meds. Placed nonrebreather back on pt and he felt better after this. O2 came back up into 90s

## 2022-06-19 NOTE — ED Notes (Signed)
Pt found to have an spo2 at 78% on 6L [place] . Pt placed on NRB at 15 L spo2 increased to 80%.

## 2022-06-19 NOTE — Progress Notes (Signed)
ANTICOAGULATION CONSULT NOTE  Pharmacy Consult for heparin Indication: chest pain/ACS  Allergies  Allergen Reactions   Metformin And Related     Held due to creatinine elevation September 2023.   Budesonide Rash    Drug rash with oral [place]     Patient Measurements: Height: 70 inches Weight: 71.1 kg (156 lb 12 oz) Heparin Dosing Weight: 71kg  Vital Signs: Temp: 98 F (36.7 C) (11/29 0509) Temp Source: Oral (11/29 0509) BP: 164/82 (11/29 0900) Pulse Rate: 111 (11/29 0800)  Labs: Recent Labs    05/22/2022 1705 05/29/2022 2230 06/19/22 0215 06/19/22 0515 06/19/22 0825  HGB 8.6*  --   --  8.4* 9.0*  HCT 25.9*  --   --  26.2* 27.7*  PLT 190  --   --  193 204  LABPROT 15.0  --   --   --   --   INR 1.2  --   --   --   --   HEPARINUNFRC  --   --   --   --  <0.10*  CREATININE 2.42*  --   --  2.27* 2.25*  CKTOTAL 63  --   --   --   --   TROPONINIHS 2,602* 2,762* 2,990* 2,574*  --      Estimated Creatinine Clearance: 29.4 mL/min (A) (by C-G formula based on SCr of 2.25 mg/dL (H)).  Jorge Adams presents with SOB and fatigue, medical history significant of squamous carcinoma of the right lung, Stage III, hypertension, hyperlipidemia, obesity, CAD, type 2 diabetes, PAD gout dysphagia electrolyte disbalance this cervical spine stenosis with myelopathy. Pharmacy to dose heparin drip for ACS/STEMI.  Today, 06/19/2022 First heparin level undetectable - per d/w RN, heparin running through peripheral site and level drawn from port CBC: Hgb low but stable SCr 2.25, CKDIV No bleeding reported   Goal of Therapy:  Heparin level 0.3-0.7 units/ml Monitor platelets by anticoagulation protocol: Yes   Plan:  Heparin bolus 4000 units x 1 Increase heparin drip to 1000 units/hr Heparin level in 8 hours Daily CBC   Jorge Adams, PharmD, BCPS Pharmacy: [phone] 06/19/2022, 9:21 AM

## 2022-06-19 NOTE — Consult Note (Signed)
NAME:  Jorge Adams, MRN:  [id number], DOB:  12/05/1950, LOS: 1 ADMISSION DATE:  06/10/2022, CONSULTATION DATE:  06/19/22 REFERRING MD:  Alfredia Ferguson, CHIEF COMPLAINT:  SOB   History of Present Illness:  71 year old man with 3b squamous cell lung carcinoma of RLL (tx hx as below) presenting with 3 months of cough and 3 weeks progressive dyspnea, orthopnea.  +wheezing and subjective fevers.  On no O2 at home.  Here requiring NRB.  Trops elevated.  Pct 0.7>>0.5.  CXR with upper lobe interstitial changes on top of chronic R changes from lung ca.  Started on HCAP coverage and PCCM consulted.  Pertinent  Medical History  3b NSCLC: last radiation 02/08/2022.  S/P carboplatin, paclitaxel x 6 cycles also finished in July.  He is now s/p 2 cycles of consolidation immunotherapy with imfinzi  last dose seems to have been beginning of this month.  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 rate (!) 30, weight 71.1 kg, SpO2 93 %.        Intake/Output Summary (Last 24 hours) at 06/19/2022 0932 Last data filed at 06/19/2022 0809 Gross per 24 hour  Intake 1006.03 ml  Output --  Net 1006.03 ml   Filed Weights   05/24/2022 2209  Weight: 71.1 kg    Examination: General: Ill appearing man mildly tachypneic with accessory muscle use HENT: pursed lip breathing, MMM, trachea midline Lungs: notable crackles R>L Cardiovascular: tachy, ext warm, occasional PVCs on monitor Abdomen: soft, +BS Extremities: minimal if any edema Neuro: Moves all 4 ext to command Skin: old sternotomy scar noted  Trop up but  flat EKG TWI, no ST depression or elevation CT and CXR personally reviewed  Resolved Hospital Problem list   N/A  Assessment & Plan:  Acute hypoxemic resp failure due to acute pneumonitis vs. Atypical pulmonary edema.  My suspicion is this is just immunomodulator-induced pneumonitis based on timing with respiratory-distress associated myocardial demand mismatch in patient with known CAD, prior CABG.    Bedside echo shows hyperdynamic function to my eye but will await formal read.  No EKG changes or symptoms to suggest myocarditis.  Question raised of VTE: can do LE duplex to start.  Already on heparin as part of ?ACS pathway.  VQ can be used but this would need to be interpreted in context of underlying architectural distortion from lung cancer.  Would hold on this for now given his tenuous resp status.  Hx 3b lung ca with FTT- full code for now but does not want prolonged life support.   I am worried about his weight loss, poor appetite since completing   - f/u formal echo; if shows any signs of volume overload could consider albumin/lasix challenge, clinical exam not really c/w volume overload - check ESR, start IV steroids - Cefepime fine for now but doubt bacterial - LE duplex, VQ scan if this is negative and resp status stable/improved tomorrow - Continue heparin drip - Oxygen to sats 90% or greater - If ends up intubated would benefit from [employer] (right click and "Reselect all SmartList Selections" daily)  Per primary.  Labs  CBC: Recent Labs  Lab 05/25/2022 1705 06/19/22 0515 06/19/22 0825  WBC 4.5 5.4 5.5  NEUTROABS 3.7  --   --   HGB 8.6* 8.4* 9.0*  HCT 25.9* 26.2* 27.7*  MCV 89.6 90.7 89.9  PLT 190 193 364    Basic Metabolic Panel: Recent Labs  Lab 06/16/2022 1705 06/15/2022 2116 06/19/22 0515 06/19/22 0825  NA 132*  --   --  137  K 3.1*  --   --  3.3*  CL 100  --   --  104  CO2 23  --   --  21*  GLUCOSE 201*  --   --  101*  BUN 57*  --   --   51*  CREATININE 2.42*  --  2.27* 2.25*  CALCIUM 7.5*  --   --  7.6*  MG 1.5* 1.3* 1.9  --   PHOS 4.3  --  3.9  --    GFR: Estimated Creatinine Clearance: 29.4 mL/min (A) (by C-G formula based on SCr of 2.25 mg/dL (H)). Recent Labs  Lab 05/26/2022 1705 06/19/22 0515 06/19/22 0825  PROCALCITON 0.73 0.53  --   WBC 4.5 5.4 5.5  LATICACIDVEN 0.9  --   --     Liver Function Tests: Recent Labs  Lab 06/20/2022 1705 06/19/22 0825  AST 43* 54*  ALT 21 20  ALKPHOS 52 51  BILITOT 0.8 0.7  PROT 5.5* 5.2*  ALBUMIN 2.0* 1.7*   No results for input(s): "LIPASE", "AMYLASE" in the last 168 hours. No results for input(s): "AMMONIA" in the last 168 hours.  ABG    Component Value Date/Time   PHART 7.326 (L) 01/02/2016 2059   PCO2ART 41.8 01/02/2016 2059   PO2ART 62.0 (L) 01/02/2016 2059   HCO3 25.4 05/22/2022 2116   TCO2 29 01/05/2016 1654   ACIDBASEDEF 4.0 (H) 01/02/2016 2059   O2SAT 65.8 06/05/2022 2116     Coagulation Profile: Recent Labs  Lab 06/06/2022 1705  INR 1.2    Cardiac Enzymes: Recent Labs  Lab 06/03/2022 1705  CKTOTAL 63    HbA1C: Hgb A1c MFr Bld  Date/Time Value Ref Range Status  02/22/2022 08:10 AM 6.6 (H) 4.8 - 5.6 % Final    Comment:    (NOTE) Pre diabetes:          5.7%-6.4%  Diabetes:              >6.4%  Glycemic control for   <7.0% adults with diabetes   10/29/2021 10:16 AM 6.4 4.6 - 6.5 % Final    Comment:    Glycemic Control Guidelines for People with Diabetes:Non Diabetic:  <6%Goal of Therapy: <7%Additional Action Suggested:  >8%     CBG: Recent Labs  Lab 06/17/2022 2358 06/19/22 0503 06/19/22 0539 06/19/22 0609 06/19/22 0803  GLUCAP 126* 62* 74 81 67*    Review of Systems:    Positive Symptoms in bold:  Constitutional fevers, chills, weight loss, fatigue, anorexia, malaise  Eyes decreased vision, double vision, eye irritation  Ears, Nose, Mouth, Throat sore throat, trouble swallowing, sinus congestion  Cardiovascular chest pain,  paroxysmal nocturnal dyspnea, lower ext edema, palpitations   Respiratory SOB, cough, DOE, hemoptysis, wheezing  Gastrointestinal nausea, vomiting, diarrhea  Genitourinary burning with urination, trouble urinating  Musculoskeletal joint aches, joint swelling, back pain  Integumentary  rashes, skin lesions  Neurological focal weakness, focal numbness, trouble speaking, headaches  Psychiatric depression, anxiety, confusion  Endocrine polyuria, polydipsia, cold intolerance, heat intolerance  Hematologic abnormal bruising, abnormal bleeding,  unexplained nose bleeds  Allergic/Immunologic recurrent infections, hives, swollen lymph nodes     Past Medical History:  He,  has a past medical history of Arthritis, ASHD (arteriosclerotic heart disease), Atherosclerosis of native arteries of the extremities with intermittent claudication, Cataract, Coronary atherosclerosis of native coronary artery, Diabetes mellitus, Dyslipidemia, ED (erectile dysfunction), Full dentures, Glaucoma associated with ocular disorder, mild stage, Hyperlipidemia, Hypertension, Impotence of organic origin, Meniscus tear (05/2012), Myocardial infarction ([place]) (10/2002), and Stented coronary artery.   Surgical History:   Past Surgical History:  Procedure Laterality Date   ANTERIOR CERVICAL DECOMP/DISCECTOMY FUSION N/A 05/07/2018   Procedure: ANTERIOR CERVICAL DECOMPRESSION/DISCECTOMY FUSION CERVICAL FIVE - CERVICAL SIX;  Surgeon: Consuella Lose, MD;  Location: [place];  Service: Neurosurgery;  Laterality: N/A;  ANTERIOR CERVICAL DECOMPRESSION/DISCECTOMY FUSION CERVICAL FIVE - CERVICAL SIX   BRONCHIAL BIOPSY  12/04/2021   Procedure: BRONCHIAL BIOPSIES;  Surgeon: Collene Gobble, MD;  Location: [place];  Service: Cardiopulmonary;;   BRONCHIAL BRUSHINGS  12/04/2021   Procedure: BRONCHIAL BRUSHINGS;  Surgeon: Collene Gobble, MD;  Location: [place];  Service: Cardiopulmonary;;   BRONCHIAL NEEDLE ASPIRATION BIOPSY   12/04/2021   Procedure: BRONCHIAL NEEDLE ASPIRATION BIOPSIES;  Surgeon: Collene Gobble, MD;  Location: [place];  Service: Cardiopulmonary;;   CARDIAC CATHETERIZATION  10/23/2002   had 1 stent placed then-- by Dr. Melvern Banker   CARDIAC CATHETERIZATION N/A 12/21/2015   Procedure: Left Heart Cath and Coronary Angiography;  Surgeon: Adrian Prows, MD;  Location: [place] [facility];  Service: Cardiovascular;  Laterality: N/A;   CATARACT EXTRACTION Right 05/2017   CATARACT EXTRACTION Left 07/2017   COLONOSCOPY  10/08/2010   Dr.Patterson   CORONARY ARTERY BYPASS GRAFT N/A 01/02/2016   Procedure: CORONARY ARTERY BYPASS GRAFTING (CABG) TIMES 3 USING LEFT INTERNAL MAMMARY ARTERY AND RIGHT SAPHENOUS LEG VEIN HARVESTED ENDOSCOPICALLY;  Surgeon: Ivin Poot, MD;  Location: [place];  Service: Open Heart Surgery;  Laterality: N/A;   EYE SURGERY     INSERTION OF MESH N/A 05/06/2016   Procedure: INSERTION OF MESH;  Surgeon: Georganna Skeans, MD;  Location: [place];  Service: General;  Laterality: N/A;   IR IMAGING GUIDED PORT INSERTION  04/12/2022   KNEE ARTHROSCOPY WITH MEDIAL MENISECTOMY  06/04/2012   Procedure: KNEE ARTHROSCOPY WITH MEDIAL MENISECTOMY;  Surgeon: Ninetta Lights, MD;  Location: [place];  Service: Orthopedics;  Laterality: Right;  RIGHT SCOPE MEDIAL MENISCECTOMY, CHONDROPLASTY, EXCISION LOOSE BODY   LOWER EXTREMITY ANGIOGRAM N/A 11/23/2013   Procedure: LOWER EXTREMITY ANGIOGRAM;  Surgeon: Laverda Page, MD;  Location: [facility];  Service: Cardiovascular;  Laterality: N/A;   NECK SURGERY N/A    02/19/2018   PERIPHERAL VASCULAR CATHETERIZATION N/A 12/21/2015   Procedure: Abdominal Aortogram;  Surgeon: Adrian Prows, MD;  Location: [place] [facility];  Service: Cardiovascular;  Laterality: N/A;   PERIPHERAL VASCULAR CATHETERIZATION N/A 12/21/2015   Procedure: Abdominal Aortogram w/Lower Extremity;  Surgeon: Adrian Prows, MD;  Location: [place] [facility];  Service: Cardiovascular;   Laterality: N/A;   TEE WITHOUT CARDIOVERSION N/A 01/02/2016   Procedure: TRANSESOPHAGEAL ECHOCARDIOGRAM (TEE);  Surgeon: Ivin Poot, MD;  Location: [place];  Service: Open Heart Surgery;  Laterality: N/A;   UMBILICAL HERNIA REPAIR N/A 05/06/2016   Procedure: HERNIA REPAIR UMBILICAL ADULT;  Surgeon: Georganna Skeans, MD;  Location: [place];  Service: General;  Laterality: N/A;   VIDEO BRONCHOSCOPY N/A 12/04/2021   Procedure: VIDEO BRONCHOSCOPY WITH FLUORO;  Surgeon: Collene Gobble, MD;  Location:  [place] [facility];  Service: Cardiopulmonary;  Laterality: N/A;     Social History:   reports that he quit smoking about 19 years ago. His smoking use included cigarettes. He has a 87.50 pack-year smoking history. He has never used smokeless tobacco. He reports that he does not currently use alcohol. He reports that he does not use drugs.   Family History:  His family history includes Bone cancer in his mother; COPD in his brother; Diabetes in his brother; Heart disease in his brother; Pneumonia in his father. There is no history of Colon cancer, Prostate cancer, Esophageal cancer, or Stomach cancer.   Allergies Allergies  Allergen Reactions   Metformin And Related     Held due to creatinine elevation September 2023.   Budesonide Rash    Drug rash with oral Tarpeyo      Home Medications  Prior to Admission medications   Medication Sig Start Date End Date Taking? Authorizing Provider  albuterol (VENTOLIN HFA) 108 (90 Base) MCG/ACT inhaler Inhale 1-2 puffs into the lungs every 6 (six) hours as needed (for cough.  okay to fill with albuterol/proair/ventolin.). Patient taking differently: Inhale 1-2 puffs into the lungs every 6 (six) hours as needed for shortness of breath. 11/06/21  Yes [name], MD  aspirin EC 81 MG tablet Take 1 tablet (81 mg total) by mouth daily. Okay to restart this medication on 12/05/2021. 12/04/21  Yes Collene Gobble, MD  brimonidine (ALPHAGAN P) 0.1 % SOLN Place 2 drops  into both eyes 2 (two) times daily.   Yes [provider]  cilostazol (PLETAL) 100 MG tablet TAKE 1 TABLET BY MOUTH TWICE A DAY 04/17/22  Yes [name], MD  Cyanocobalamin (VITAMIN B12 PO) Take 1 tablet by mouth in the morning and at bedtime.   Yes [provider]  dorzolamide-timolol (COSOPT) 22.3-6.8 MG/ML ophthalmic solution Place 1 drop into both eyes 2 (two) times daily.   Yes [provider]  doxycycline (VIBRA-TABS) 100 MG tablet Take 1 tablet (100 mg total) by mouth 2 (two) times daily. 06/17/22  Yes Heilingoetter, [name] L, PA-C  FeFum-FePoly-FA-B Cmp-C-Biot (FOLIVANE-PLUS) CAPS TAKE 1 CAPSULE BY MOUTH EVERY DAY IN THE MORNING Patient taking differently: Take 1 capsule by mouth daily. 05/31/22  Yes Heilingoetter, [name] L, PA-C  fenofibrate (TRICOR) 145 MG tablet TAKE 1 TABLET BY MOUTH EVERY DAY IN THE EVENING Patient taking differently: Take 145 mg by mouth daily. 01/03/22  Yes Adrian Prows, MD  HYDROcodone bit-homatropine (HYCODAN) 5-1.5 MG/5ML syrup Take 5 mLs by mouth every 8 (eight) hours as needed for cough (sedation caution.). 06/04/22  Yes [name], MD  ipratropium-albuterol (DUONEB) 0.5-2.5 (3) MG/3ML SOLN Take 3 mLs by nebulization every 6 (six) hours as needed. Patient taking differently: Take 3 mLs by nebulization every 6 (six) hours as needed (for shortness of breath). 02/25/22  Yes Oswald Hillock, MD  levothyroxine (SYNTHROID) 50 MCG tablet TAKE 1 TABLET BY MOUTH EVERY DAY BEFORE BREAKFAST Patient taking differently: Take 50 mcg by mouth daily before breakfast. 05/15/22  Yes Curt Bears, MD  Magnesium Oxide 250 MG TABS Take 1 tablet (250 mg total) by mouth daily. 05/05/22  Yes [name], MD  metoprolol tartrate (LOPRESSOR) 25 MG tablet Take 25 mg by mouth 2 (two) times daily. 05/20/22  Yes [provider]  mirtazapine (REMERON) 7.5 MG tablet TAKE 1 TABLET BY MOUTH AT BEDTIME. 05/26/22  Yes [name], MD   Netarsudil-Latanoprost (ROCKLATAN) 0.02-0.005 % SOLN Place  1 drop into both eyes at bedtime.   Yes [provider]  nitroGLYCERIN (NITROSTAT) 0.4 MG SL tablet Place 1 tablet (0.4 mg total) under the tongue every 5 (five) minutes as needed for chest pain. 11/06/21  Yes [name], MD  omega-3 acid ethyl esters (LOVAZA) 1 g capsule TAKE 1 CAPSULE BY MOUTH TWICE A DAY Patient taking differently: Take 1 g by mouth 2 (two) times daily. 01/03/22  Yes Adrian Prows, MD  pantoprazole (PROTONIX) 40 MG tablet Take 40 mg by mouth daily. 05/29/22  Yes [provider]  potassium chloride 20 MEQ/15ML (10%) SOLN TAKE 30 MLS (40 MEQ TOTAL) BY MOUTH 2 (TWO) TIMES DAILY. 05/08/22  Yes [name], MD  predniSONE (DELTASONE) 20 MG tablet Take 2 a day for 5 days, then 1 a day for 5 days, with food. Don't take with aleve/ibuprofen. Patient taking differently: Take 40 mg by mouth daily with breakfast. 05/10/22  Yes [name], MD  prochlorperazine (COMPAZINE) 10 MG tablet TAKE 1 TABLET BY MOUTH EVERY 6 HOURS AS NEEDED FOR NAUSEA OR VOMITING. Patient taking differently: Take 10 mg by mouth every 6 (six) hours as needed for nausea or vomiting. 05/10/22  Yes Heilingoetter, [name] L, PA-C  rosuvastatin (CRESTOR) 20 MG tablet TAKE 1 TABLET BY MOUTH EVERY DAY Patient taking differently: Take 20 mg by mouth daily. 04/17/22  Yes Adrian Prows, MD  torsemide (DEMADEX) 20 MG tablet Take 2 tablets (40 mg total) by mouth 2 (two) times daily. Patient taking differently: Take 40 mg by mouth daily as needed (Fluid). 05/02/22  Yes [name], MD  triamcinolone cream (KENALOG) 0.1 % Apply 1 Application topically 2 (two) times daily. Patient taking differently: Apply 1 Application topically daily as needed (For rash). 05/02/22  Yes [name], MD     Critical care time: 35 minutes

## 2022-06-19 NOTE — Progress Notes (Signed)
Bilateral lower extremity venous duplex has been completed. Preliminary results can be found in CV Proc through chart review.  Results were given to Dr. Einar Gip.   06/19/22 11:42 AM Carlos Levering RVT

## 2022-06-19 NOTE — Progress Notes (Signed)
  Echocardiogram 2D Echocardiogram has been performed.  Jorge Adams 06/19/2022, 12:10 PM

## 2022-06-19 NOTE — Assessment & Plan Note (Signed)
Await results of urine culture for now should be able to cover with cefepime

## 2022-06-19 NOTE — Progress Notes (Signed)
  PT Cancellation Note  Patient Details Name: SHERMAN DONALDSON MRN: [id number] DOB: [birth year]   Cancelled Treatment:    Reason Eval/Treat Not Completed: Patient not medically ready, n NRB mask  [place] Office [phone] Weekend pager-[phone]  Claretha Cooper 06/19/2022, 10:59 AM

## 2022-06-20 ENCOUNTER — Inpatient Hospital Stay (HOSPITAL_COMMUNITY): Payer: Medicare HMO

## 2022-06-20 DIAGNOSIS — J189 Pneumonia, unspecified organism: Secondary | ICD-10-CM | POA: Diagnosis not present

## 2022-06-20 DIAGNOSIS — E1159 Type 2 diabetes mellitus with other circulatory complications: Secondary | ICD-10-CM | POA: Diagnosis not present

## 2022-06-20 DIAGNOSIS — J9601 Acute respiratory failure with hypoxia: Secondary | ICD-10-CM | POA: Diagnosis not present

## 2022-06-20 DIAGNOSIS — R748 Abnormal levels of other serum enzymes: Secondary | ICD-10-CM | POA: Diagnosis not present

## 2022-06-20 LAB — GLUCOSE, CAPILLARY
Glucose-Capillary: 103 mg/dL — ABNORMAL HIGH (ref 70–99)
Glucose-Capillary: 181 mg/dL — ABNORMAL HIGH (ref 70–99)
Glucose-Capillary: 191 mg/dL — ABNORMAL HIGH (ref 70–99)
Glucose-Capillary: 211 mg/dL — ABNORMAL HIGH (ref 70–99)
Glucose-Capillary: 226 mg/dL — ABNORMAL HIGH (ref 70–99)
Glucose-Capillary: 297 mg/dL — ABNORMAL HIGH (ref 70–99)

## 2022-06-20 LAB — CBC WITH DIFFERENTIAL/PLATELET
Abs Immature Granulocytes: 0.02 10*3/uL (ref 0.00–0.07)
Abs Immature Granulocytes: 0.03 10*3/uL (ref 0.00–0.07)
Basophils Absolute: 0 10*3/uL (ref 0.0–0.1)
Basophils Absolute: 0 10*3/uL (ref 0.0–0.1)
Basophils Relative: 0 %
Basophils Relative: 0 %
Eosinophils Absolute: 0 10*3/uL (ref 0.0–0.5)
Eosinophils Absolute: 0 10*3/uL (ref 0.0–0.5)
Eosinophils Relative: 0 %
Eosinophils Relative: 0 %
HCT: 21.7 % — ABNORMAL LOW (ref 39.0–52.0)
HCT: 22.8 % — ABNORMAL LOW (ref 39.0–52.0)
Hemoglobin: 7 g/dL — ABNORMAL LOW (ref 13.0–17.0)
Hemoglobin: 7.4 g/dL — ABNORMAL LOW (ref 13.0–17.0)
Immature Granulocytes: 0 %
Immature Granulocytes: 1 %
Lymphocytes Relative: 8 %
Lymphocytes Relative: 10 %
Lymphs Abs: 0.4 10*3/uL — ABNORMAL LOW (ref 0.7–4.0)
Lymphs Abs: 0.6 10*3/uL — ABNORMAL LOW (ref 0.7–4.0)
MCH: 29.4 pg (ref 26.0–34.0)
MCH: 29.6 pg (ref 26.0–34.0)
MCHC: 32.3 g/dL (ref 30.0–36.0)
MCHC: 32.5 g/dL (ref 30.0–36.0)
MCV: 91.2 fL (ref 80.0–100.0)
MCV: 91.2 fL (ref 80.0–100.0)
Monocytes Absolute: 0.1 10*3/uL (ref 0.1–1.0)
Monocytes Absolute: 0.1 10*3/uL (ref 0.1–1.0)
Monocytes Relative: 2 %
Monocytes Relative: 2 %
Neutro Abs: 4.6 10*3/uL (ref 1.7–7.7)
Neutro Abs: 5.3 10*3/uL (ref 1.7–7.7)
Neutrophils Relative %: 90 %
Neutrophils Relative %: 87 %
Platelets: 176 10*3/uL (ref 150–400)
Platelets: 169 10*3/uL (ref 150–400)
RBC: 2.38 MIL/uL — ABNORMAL LOW (ref 4.22–5.81)
RBC: 2.5 MIL/uL — ABNORMAL LOW (ref 4.22–5.81)
RDW: 16.3 % — ABNORMAL HIGH (ref 11.5–15.5)
RDW: 16.5 % — ABNORMAL HIGH (ref 11.5–15.5)
WBC: 5.1 10*3/uL (ref 4.0–10.5)
WBC: 6.1 10*3/uL (ref 4.0–10.5)
nRBC: 0 % (ref 0.0–0.2)
nRBC: 0 % (ref 0.0–0.2)

## 2022-06-20 LAB — HEPARIN LEVEL (UNFRACTIONATED)
Heparin Unfractionated: 0.2 IU/mL — ABNORMAL LOW (ref 0.30–0.70)
Heparin Unfractionated: 0.23 IU/mL — ABNORMAL LOW (ref 0.30–0.70)
Heparin Unfractionated: 0.43 IU/mL (ref 0.30–0.70)

## 2022-06-20 LAB — PHOSPHORUS: Phosphorus: 4.1 mg/dL (ref 2.5–4.6)

## 2022-06-20 LAB — RETICULOCYTES
Immature Retic Fract: 11.8 % (ref 2.3–15.9)
RBC.: 2.38 MIL/uL — ABNORMAL LOW (ref 4.22–5.81)
Retic Count, Absolute: 46.9 10*3/uL (ref 19.0–186.0)
Retic Ct Pct: 2 % (ref 0.4–3.1)

## 2022-06-20 LAB — PROCALCITONIN: Procalcitonin: 0.79 ng/mL

## 2022-06-20 LAB — FOLATE: Folate: 14.9 ng/mL (ref >5.9)

## 2022-06-20 LAB — FERRITIN: Ferritin: 3644 ng/mL — ABNORMAL HIGH (ref 24–336)

## 2022-06-20 LAB — COMPREHENSIVE METABOLIC PANEL
ALT: 18 U/L (ref 0–44)
AST: 40 U/L (ref 15–41)
Albumin: 1.6 g/dL — ABNORMAL LOW (ref 3.5–5.0)
Alkaline Phosphatase: 50 U/L (ref 38–126)
Anion gap: 8 (ref 5–15)
BUN: 60 mg/dL — ABNORMAL HIGH (ref 8–23)
CO2: 21 mmol/L — ABNORMAL LOW (ref 22–32)
Calcium: 7.3 mg/dL — ABNORMAL LOW (ref 8.9–10.3)
Chloride: 105 mmol/L (ref 98–111)
Creatinine, Ser: 2.55 mg/dL — ABNORMAL HIGH (ref 0.61–1.24)
GFR, Estimated: 26 mL/min — ABNORMAL LOW (ref >60)
Glucose, Bld: 249 mg/dL — ABNORMAL HIGH (ref 70–99)
Potassium: 4 mmol/L (ref 3.5–5.1)
Sodium: 134 mmol/L — ABNORMAL LOW (ref 135–145)
Total Bilirubin: 0.5 mg/dL (ref 0.3–1.2)
Total Protein: 4.6 g/dL — ABNORMAL LOW (ref 6.5–8.1)

## 2022-06-20 LAB — BRAIN NATRIURETIC PEPTIDE: B Natriuretic Peptide: 467 pg/mL — ABNORMAL HIGH (ref 0.0–100.0)

## 2022-06-20 LAB — IRON AND TIBC
Iron: 24 ug/dL — ABNORMAL LOW (ref 45–182)
Saturation Ratios: 16 % — ABNORMAL LOW (ref 17.9–39.5)
TIBC: 150 ug/dL — ABNORMAL LOW (ref 250–450)
UIBC: 126 ug/dL

## 2022-06-20 LAB — LEGIONELLA PNEUMOPHILA SEROGP 1 UR AG: L. pneumophila Serogp 1 Ur Ag: NEGATIVE

## 2022-06-20 LAB — MAGNESIUM: Magnesium: 1.7 mg/dL (ref 1.7–2.4)

## 2022-06-20 LAB — VITAMIN B12: Vitamin B-12: 444 pg/mL (ref 180–914)

## 2022-06-20 MED ORDER — SODIUM CHLORIDE 0.9% IV SOLUTION: Freq: Once | INTRAVENOUS | Status: DC

## 2022-06-20 MED ORDER — HEPARIN BOLUS VIA INFUSION
2000.0000 [IU] | Freq: Once | INTRAVENOUS | Status: AC
  Administered 2022-06-20: 2000 [IU] via INTRAVENOUS
  Filled 2022-06-20: qty 2000

## 2022-06-20 MED ORDER — SODIUM CHLORIDE 0.9 % IV BOLUS
250.0000 mL | Freq: Once | INTRAVENOUS | Status: AC
  Administered 2022-06-20: 250 mL via INTRAVENOUS

## 2022-06-20 MED ORDER — DEXMEDETOMIDINE HCL IN NACL 200 MCG/50ML IV SOLN
0.4000 ug/kg/h | INTRAVENOUS | Status: DC
  Administered 2022-06-20: 0.4 ug/kg/h via INTRAVENOUS
  Filled 2022-06-20: qty 50

## 2022-06-20 MED FILL — Potassium Chloride Oral Soln 10% (20 MEQ/15ML): ORAL | Qty: 15 | Status: AC

## 2022-06-20 NOTE — Progress Notes (Signed)
06/20/2022  Getting tired with HHFNC.  Wants to avoid intubation if at all possible which I agree with.  Added BIPAP and precedex, this seems to have settled things out for now.  Okay with vent if things progress, hopefully will start to respond soon.  Family updated at bedside  Erskine Emery MD PCCM

## 2022-06-20 NOTE — Progress Notes (Signed)
OT Cancellation Note  Patient Details Name: MAKIH STEFANKO MRN: [id number] DOB: 04-21-1951   Cancelled Treatment:    Reason Eval/Treat Not Completed: Medical issues which prohibited therapy. RN requests therapist hold today due to increased oxygenation needs and low BP. Will follow as able.  Jemya Depierro L Sherisse Fullilove 06/20/2022, 2:07 PM

## 2022-06-20 NOTE — Progress Notes (Signed)
Initial Nutrition Assessment  DOCUMENTATION CODES:   Non-severe (moderate) malnutrition in context of chronic illness  INTERVENTION:  - Continue Carb Modified diet as medically appropriate.  - Boost Breeze po BID, each supplement provides 250 kcal and 9 grams of protein  - Patient drinks at home and enjoys.  - Encourage intake at all meals and of supplements as tolerated.  - Continue Remeron as medically appropriate - can act as an appetite stimulant. - Monitor weight trends.    NUTRITION DIAGNOSIS:   Moderate Malnutrition related to chronic illness (stage 3 lung cancer on chemotherapy) as evidenced by moderate muscle depletion, moderate fat depletion, percent weight loss (23% in 7 months).  GOAL:   Patient will meet greater than or equal to 90% of their needs  MONITOR:   PO intake, Supplement acceptance, Weight trends  REASON FOR ASSESSMENT:   Malnutrition Screening Tool    ASSESSMENT:   71 y.o. male with medical history significant of squamous carcinoma of the right lung Stage III, HTN, HLD, obesity, CAD, DMII, PAD, gout, dysphagia, cervical spine stenosis with myelopathy who presented with SOB found to have acute respiratory failure with community acquired pneumonia.   Met with patient at bedside this AM. He reports a UBW of 224# and weight loss starting in May due to starting chemo. Per EMR, patient has not weighed more than 220# since 2019. However, per EMR pt weighed at 205# in April and has since lost to admission weigh of 157# - a 48# or 23% weight loss in 7 months, severe. Patient confirms this is likely accurate.  Patient states he feels he has been eating decently the past few months. Usually has a breakfast and a dinner in addition to [employer] or Ensure Clear 2-3 times a day. Appetite is "up and down".  Current appetite is better than it has been recently and patient reports eating well at dinner last night. He admits he did not have a breakfast this morning due  to being on BiPAP. Feels he could eat a lunch soon. Pt is agreeable to receive Boost Breeze during admission to support intake.    Medications reviewed and include: Insulin, Synthroid, Mg daily, Remeron, K+ BID daily  Labs reviewed:  Creatinine 2.72 HA1C 6.6 Blood Glucose 91-211 x24 hours   NUTRITION - FOCUSED PHYSICAL EXAM:  Flowsheet Row Most Recent Value  Orbital Region Moderate depletion  Upper Arm Region Moderate depletion  Thoracic and Lumbar Region Moderate depletion  Buccal Region Moderate depletion  Temple Region Severe depletion  Clavicle Bone Region Mild depletion  Clavicle and Acromion Bone Region Mild depletion  Scapular Bone Region Unable to assess  Dorsal Hand Mild depletion  Patellar Region Moderate depletion  Anterior Thigh Region Moderate depletion  Posterior Calf Region Moderate depletion  Edema (RD Assessment) Mild  Hair Reviewed  Eyes Reviewed  Mouth Reviewed  Skin Reviewed  Nails Reviewed       Diet Order:   Diet Order             Diet Carb Modified Fluid consistency: Thin; Room service appropriate? Yes  Diet effective now                   EDUCATION NEEDS:  No education needs have been identified at this time  Skin:  Skin Assessment: Reviewed RN Assessment  Last BM:  11/29  Height:  Ht Readings from Last 1 Encounters:  05/22/22 _0  (1.778 m)   Weight:  Wt Readings from Last 1  Encounters:  06/10/2022 71.1 kg   Ideal Body Weight:     BMI:  Body mass index is 25.15 kg/m.  Estimated Nutritional Needs:  Kcal:  2150-2500 kcals Protein:  105-120 grams Fluid:  >/= 2.1L    Samson Frederic RD, LDN For contact information, refer to [facility].

## 2022-06-20 NOTE — Progress Notes (Signed)
ANTICOAGULATION CONSULT NOTE - Follow Up Consult  Pharmacy Consult for Heparin Indication: chest pain/ACS, pulmonary embolus, and DVT  Allergies  Allergen Reactions   Metformin And Related     Held due to creatinine elevation September 2023.   Budesonide Rash    Drug rash with oral Tarpeyo     Patient Measurements: Weight: 80.7 kg (177 lb 14.6 oz) Heparin Dosing Weight: TBW  Vital Signs: Temp: 99.6 F (37.6 C) (11/30 0431) Temp Source: Oral (11/30 0431) BP: 124/55 (11/30 0500) Pulse Rate: 95 (11/30 0500)  Labs: Recent Labs    05/26/2022 1705 05/23/2022 2230 06/19/22 0215 06/19/22 0515 06/19/22 0825 06/19/22 1838 06/20/22 0422  HGB 8.6*  --   --  8.4* 9.0*  --  7.0*  HCT 25.9*  --   --  26.2* 27.7*  --  21.7*  PLT 190  --   --  193 204  --  176  LABPROT 15.0  --   --   --   --   --   --   INR 1.2  --   --   --   --   --   --   HEPARINUNFRC  --   --   --   --  <0.10* <0.10* 0.20*  CREATININE 2.42*  --   --  2.27* 2.25*  --  2.55*  CKTOTAL 63  --   --   --   --   --   --   TROPONINIHS 2,602*   < > 2,990* 2,574* 2,623*  --   --    < > = values in this interval not displayed.     Estimated Creatinine Clearance: 26.6 mL/min (A) (by C-G formula based on SCr of 2.55 mg/dL (H)).   Medications:  Infusions:   ceFEPime (MAXIPIME) IV Stopped (06/19/22 2241)   heparin 1,250 Units/hr (06/20/22 0516)    Assessment: 71 yo male presents with SOB and fatigue, medical history significant of squamous carcinoma of the right lung, Stage III, hypertension, hyperlipidemia, obesity, CAD, type 2 diabetes, PAD gout dysphagia electrolyte disbalance this cervical spine stenosis with myelopathy. Pharmacy to dose heparin drip for ACS/STEMI.  Dopplers + bilateral LE DVT Unable to obtain CTa due to renal function, but treating empirically for PE.   Today, 06/20/2022: Heparin level 0.2 subtherapeutic on 1250 units/hr CBC: Hgb down to 7, plts WNL SCr up to 2.55 No bleeding or complications  reported.  Goal of Therapy:  Heparin level 0.3-0.7 units/ml Monitor platelets by anticoagulation protocol: Yes   Plan:  Increase to heparin IV infusion to 1450 units/hr Heparin level 8 hours after rate change Daily heparin level and CBC  Dolly Rias RPh 06/20/2022, 5:25 AM

## 2022-06-20 NOTE — Progress Notes (Signed)
Patient placed on BIPAP @ 14/6 45%. Patient appears to be tolerating at this time. RT will continue to monitor

## 2022-06-20 NOTE — Progress Notes (Signed)
ANTICOAGULATION CONSULT NOTE - Follow Up Consult  Pharmacy Consult for Heparin Indication: chest pain/ACS, DVT/PE   Allergies  Allergen Reactions   Metformin And Related     Held due to creatinine elevation September 2023.   Budesonide Rash    Drug rash with oral [place]     Patient Measurements: Weight: 80.7 kg (177 lb 14.6 oz) Heparin Dosing Weight:  80.7 kg  Vital Signs: Temp: 98.2 F (36.8 C) (11/30 1200) Temp Source: Axillary (11/30 1200) BP: 107/54 (11/30 1400) Pulse Rate: 109 (11/30 1400)  Labs: Recent Labs    05/26/2022 1705 05/30/2022 2230 06/19/22 0215 06/19/22 0515 06/19/22 0515 06/19/22 0825 06/19/22 1838 06/20/22 0422 06/20/22 0940 06/20/22 1417  HGB 8.6*  --   --  8.4*  --  9.0*  --  7.0* 7.4*  --   HCT 25.9*  --   --  26.2*  --  27.7*  --  21.7* 22.8*  --   PLT 190  --   --  193  --  204  --  176 169  --   LABPROT 15.0  --   --   --   --   --   --   --   --   --   INR 1.2  --   --   --   --   --   --   --   --   --   HEPARINUNFRC  --   --   --   --    < > <0.10* <0.10* 0.20*  --  0.23*  CREATININE 2.42*  --   --  2.27*  --  2.25*  --  2.55*  --   --   CKTOTAL 63  --   --   --   --   --   --   --   --   --   TROPONINIHS 2,602*   < > 2,990* 2,574*  --  2,623*  --   --   --   --    < > = values in this interval not displayed.    Estimated Creatinine Clearance: 26.6 mL/min (A) (by C-G formula based on SCr of 2.55 mg/dL (H)).   Assessment: AC/Heme: IV heparin for ACS + DVT and presumed PE LE doppler: Findings consistent with acute deep vein thrombosis involving the right peroneal veins and left popliteal vein.  - Hep level 0.23 remains low, Hgb 7.4, Plts 169   Goal of Therapy:  Heparin level 0.3-0.7 units/ml Monitor platelets by anticoagulation protocol: Yes   Plan:  Bolus heparin 2000 units and increase to 1650 units/hr. Recheck heparin level in 6-8 hrs. Daily HL and CBC  Haynes Giannotti S. Alford Highland, PharmD, BCPS Clinical Staff  Pharmacist [email or web address] Alford Highland, [place] 06/20/2022,3:09 PM

## 2022-06-20 NOTE — Progress Notes (Signed)
   NAME:  Jorge Adams, MRN:  [id number], DOB:  12/07/1950, LOS: 2 ADMISSION DATE:  06/03/2022, CONSULTATION DATE:  06/19/22 REFERRING MD:  Alfredia Ferguson, CHIEF COMPLAINT:  SOB   History of Present Illness:  71 year old man with 3b squamous cell lung carcinoma of RLL (tx hx as below) presenting with 3 months of cough and 3 weeks progressive dyspnea, orthopnea.  +wheezing and subjective fevers.  On no O2 at home.  Found to have acute hypoxemic respiratory failure due to multifocal pneumonitis and presumed PE with +LE duplex.  Pertinent  Medical History  3b NSCLC: last radiation 02/08/2022.  S/P carboplatin, paclitaxel x 6 cycles also finished in July.  He is now s/p 2 cycles of consolidation immunotherapy with imfinzi  last dose seems to have been beginning of this month.  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  Resp status remains tenuous.  Objective   Blood pressure (!) 148/63, pulse 95, temperature 99.6 F (37.6 C), temperature source Oral, resp. rate (!) 30, weight 80.7 kg, SpO2 100 %.    FiO2 (%):  [100 %] 100 %   Intake/Output Summary (Last 24 hours) at 06/20/2022 0731 Last data filed at 06/20/2022 3154 Gross per 24 hour  Intake 2861.08 ml  Output 300 ml  Net 2561.08 ml    Filed Weights   06/05/2022 2209 06/20/22 0431  Weight: 71.1 kg 80.7 kg    Examination: Ill appearing man with less air hunger than yesterday Rhonci on R, clear on L, mildly tachypneic Ext warm, trace edema Heart sounds less tachy today AOx3  Trop up but flat EKG TWI, no ST depression or elevation CT and CXR personally reviewed  Resolved Hospital Problem list   N/A  Assessment & Plan:  Acute hypoxemic resp  failure multifactorial from PE, multifocal pneumonitis about 2 months out from last immunotherapy and 1 month out from symptoms starting. Working diagnosis is inflammatory pneumonitis complicated by PE within past week that led to precipitous decline.  Silent aspiration in differential. Hx 3b lung ca with FTT, poor PO ? From radiation esophagitis  - 5-7 days cefepime - Heparin gtt - Trial of HHFNC instead of NRB - Solumedrol - Once O2 needs more reasonable would like MBSS +/- esophagram - Will follow  Best Practice (right click and "Reselect all SmartList Selections" daily)  Per primary.  Erskine Emery MD PCCM

## 2022-06-20 NOTE — Progress Notes (Signed)
PROGRESS NOTE    KEVEON AMSLER  WUJ:[id number] DOB: [birth year] DOA: 06/09/2022 PCP: [name], MD   Brief Narrative:  The patient is a 71 year old Caucasian male with past medical history significant for but not limited to stage IIIb squamous carcinoma of the right lung, hypertension, hyperlipidemia, CAD, diabetes mellitus type 2, PAD, history of gout, history of dysphagia as well as electrolyte disbalance as well as cervical spinal stenosis with myelopathy and other comorbidities who presents with worsening shortness of breath and fatigue.  Patient noted that he had a cough for the last 4 days and worsening shortness of breath and had a CT scan that showed possible inflammation and he has been taking doxycycline for 2 weeks without making progress.  He has had decreased p.o. intake and has been feeling dehydrated and worsened to the point where he got progressively weak to the point where it was difficult from walking.  He currently sees oncology Dr. Earlie Server is currently undergoing chemotherapy and of note the last week he was told he had UTI and was treated with unknown antibiotic as well.  Patient has had a subjective fever and states that he had diarrhea on Thursday and Friday which is now stopped after taking the meds for his UTI.  In the ED he had to be placed on a nonrebreather given that he significantly desaturated.  Troponins were significantly elevated and cardiology was consulted as well as pulmonary.  Will notify the oncology team via epic given that he is hospitalized.  Further workup reveals that he has extensive volume overload and acute on chronic diastolic CHF so cardiology has recommended diuresing.  Patient was found to have extensive lower extremity venous DVTs bilaterally and he has been anticoagulated heparin drip.     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  They recommend continuing  broad-spectrum antibiotics and IV heparin for thrombosis.  Given his worsening and decompensation patient will be placed on BiPAP and oncology did discuss the severity with the patient and his wife that he may need intubation or life support.  His blood count did drop a little bit today so he is typed and screened and transfused 1 unit of PRBCs given his tenuous respiratory status pulmonary is going to decide whether to intubate or not around 6 PM this evening  Assessment and Plan:  Acute Respiratory Failure with Hypoxia likely secondary to suspected pneumonia versus pneumonitis, possible PE as well as acute on Chronic Diastolic CHF -Is admitted to the inpatient progressive unit but we have changed him to the stepdown unit given his significant oxygen requirement and high risk for decompensation -SpO2: 91 % O2 Flow Rate (L/min): 10 L/min -Pulmonary and cardiology has been consulted and appreciate further evaluation recommendations -Cardiology recommends obtaining an echocardiogram and treating empirically for PE -Unfortunately unable to obtain a CTA given his renal function and pulmonary recommends holding off given his tenuous respiratory status currently -Pulmonary feels that his lung findings are related to immunomodulators induced pneumonitis -Will empirically treat him for a PE and have started anticoagulation with a heparin drip -Pulmonary recommends obtaining ESR which was 40 and starting IV steroids -Continue diuresis per cardiology and echocardiogram as below -CT Scan done 06/17/22 showed "Interval response to therapy as evidenced by decrease in size of a cavitary mass in the right lower lobe. Interval improvement in interstitial thickening and peribronchovascular nodularity in the right middle and right lower lobes. New patchy  ground-glass in  the upper lobes and left lower lobe with associated areas of consolidation in the right upper lobe. Findings are likely infectious/inflammatory in  etiology given the relatively short interval from 02/22/2022. Enlarging low right paratracheal lymph node, possibly reactive. recommend attention on follow-up. Small right pleural effusion, increased. Cylindrical bronchiectasis. Cholelithiasis. Aortic atherosclerosis (ICD10-I70.0). Enlarged pulmonic trunk, indicative of pulmonary arterial hypertension. Emphysema" -Pulmonary feels that if he ends up intubated he would benefit from bronchoscopy and they are recommending oxygen saturations greater than 90% -He will Need an Ambulatory home O2 screen prior to discharge as well as repeat chest x-ray in the a.m. -CXR this AM Showed "Diffuse interstitial and alveolar opacity throughout the RIGHT and LEFT chest with moderate increased since previous imaging. A baseline there is some interstitial prominence and ground-glass that was seen on previous imaging now more diffuse. Superimposed  edema, infection of for pneumonitis are considered. Potential slight interval enlargement of pleural fluid in the RIGHT chest since previous imaging." -Continue with albuterol neb 3 mL every 6 as needed for cough as well as antibiotic coverage for HCAP with cefepime -Currently on scheduled bronchodilators with DuoNeb 3 mL 4 times daily as well as IV as needed every 4 hours for wheezing or shortness of breath -Given his worsening shortness of breath he was transitioned to heated high flow nasal cannula and then subsequently BiPAP and is now 14/6 BiPAP at 45%; pulmonary is going to decide whether to intubate him or not later this evening   Acute Bilateral DVT concern for PE -LE Venous Duplex done and showed "Findings consistent with acute deep vein thrombosis involving the right peroneal veins. No cystic structure found in the popliteal fossa.Findings consistent with acute deep vein thrombosis involving the left popliteal vein. No cystic structure found in the popliteal fossa."  -Currently has been started on heparin drip and will  continue for now will need oncology involvement given his history of lung cancer to transition likely to Lovenox or DOAC   Hypothyroidism -Continue levothyroxine 50 mcg p.o. daily before breakfast -TSH was checked and is now 1.560   CAP (community acquired pneumonia) versus pneumonitis -Failed outpatient treatment history of chemotherapy -Given suspected compromised immune system -Due to brought spectrum coverage for right now with cefepime and IV Vancomycin which has now been discontinued  given Negative MRSA.   -Obtain blood cultures sputum cultures check for Legionella and Strep Pneumo -PCT was 0.53 -> 0.79 -Pulmonary recommending starting steroids and have initiated methylprednisolone 125 mg IV daily and will continue    Hypertension associated with diabetes ([facility]) -Allow permissive hypertension for tonight   Hyperlipidemia LDL goal <70 -Continue Rosuvastatin 20 mg a day and Fenofibrate 160 mg a day   Type 2 Diabetes mellitus with vascular disease (HCC) -Continue with sensitive NovoLog sliding scale insulin every 4 hours and continue to Monitor blood sugars per protocol -CBGs ranging from 62-158   CAD (Coronary Artery Disease) -Continue Aspirin and Rosuvastatin 20 mg p.o. daily hold beta-blocker for tonight restart when able   Hypokalemia -Potassium is now 4.0  -Continue to monitor and replete as necessary -Repeat CMP in a.m.   PAD (peripheral artery disease) (HCC) -Continue Aspirin 81 mg p.o. daily, Cilostazol 100 g p.o. daily and Rosuvastatin 20 mg p.o. daily   Stage III squamous cell carcinoma of right lung (HCC) -Added Dr. Julien Nordmann to the care teams for further evaluation and Dr. Burr Medico saw the patient on his behalf given that Dr. Julien Nordmann is out on PAL -Dr. Burr Medico agrees with the  plan of care and recommended and discussed with the patient is a very high risk for further decompensation and that he may need intubation and life support given his severe hypoxia.  Given that they  feel that he is pulmonary to address chemoradiation to his stage III lung cancer and that he is not terminal from a cancer standpoint and feel that FULL CODE is still appropriate   Normocytic Anemia -Chronic but progressive  -Patient's hemoglobin/hematocrit trend has gone from 8.4/26.2 -> 9.0/27.7 -> 7.0/21.7 -> 7.4/22.8 and will type and screen and transfuse 1 unit pRBC  -Checked Anemia Panel and is now showing an iron level of 24, UIBC of 126, TIBC 150, saturation ratios of 16%, ferritin level 3644, folate of 14.9, vitamin B12 444 -Continue monitor for signs and symptoms of bleeding; no overt bleeding noted -Repeat CBC in a.m.    CKD (chronic kidney disease), stage IV (HCC) Metabolic Acidosis  -Chronic  -Avoid nephrotoxic medications such as NSAIDs, Vanco Zosyn combo as well as hypotension and dehydration to ensure adequate renal perfusion and avoid IV contrast if possible -Patient's BUNs/creatinine has gone from 57/2.42 -> 51/2.25 -> 60/2.55 -Patient CO2 is now 21, anion gap is 8, chloride level is 105 -Continue To monitor trend and repeat CMP in a.m.   Acute on Chronic diastolic CHF (congestive heart failure) [facility]) -Cardiology has been consulted and resuming Torsemide 20 mg p.o. twice daily -Appreciate cardiology assistance and they have resumed metoprolol succinate 25 mg p.o. daily, rosuvastatin 20 mg p.o. daily, BiDil 1 tab p.o. 3 times daily and aspirin 81 mg p.o. daily -BNP was 467.0 this AM  -Cardiology recommends following up on BMP serially over the next 2 to 3 days and he is not on any ACE inhibitors due to his renal failure -Strict I's and O's and daily weights; patient was +2.953 L -Continue to monitor for signs and symptoms of volume overload -CXR this AM showed "Diffuse interstitial and alveolar opacity throughout the RIGHT and LEFT chest with moderate increased since previous imaging. A baseline there is some interstitial prominence and ground-glass that was seen on previous  imaging now more diffuse. Superimposed edema, infection of for pneumonitis are considered. Potential slight interval enlargement of pleural fluid in the RIGHT chest since previous imaging." -Repeat chest x-ray in a.m.   Hypoalbuminemia -Albumin level is now 1.7 yesterday and today is 1.6 -Continue to monitor and trend   Elevated Troponin -No chest pain discussed with cardiology most likely demand ischemia will obtain echogram for right now cover definitive heparin per pharmacy until results of echocardiogram.   -Patient's troponin went from 2990 -> 2754 -> 2623 -Mains on heparin drip for anticoagulant for DVT and suspected PE -Patient did have an abnormal EKG but troponin has been flat around 3000 and cardiology feels that this could either be a cardiomyopathy versus a PE in the setting with EKG abnormality   Suspected UTI (urinary tract infection) -Patient's urinalysis showed a cloudy appearance with amber-colored urine, large hemoglobin, large leukocytes, greater than 300 protein, many bacteria, greater than 50 RBCs per high-power field, 0-5 squamous epithelial cells, greater than 50 WBCs with urine culture pending -Currently remains on IV Cefepime and will continue  DVT prophylaxis: SCDs Start: 06/19/22 0810    Code Status: Full Code Family Communication: Discussed with wife at bedside  Disposition Plan:  Level of care: Stepdown Status is: Inpatient Remains inpatient appropriate because: Has had further respiratory worsening and is now on BiPAP and will need close monitoring and  may end up being intubated this evening   Consultants:  Cardiology Pulmonary Medical Oncology  Procedures:  As delineated  Antimicrobials:  Anti-infectives (From admission, onward)    Start     Dose/Rate Route Frequency Ordered Stop   06/19/22 2200  vancomycin (VANCOREADY) IVPB 1250 mg/250 mL  Status:  Discontinued        1,250 mg 166.7 mL/hr over 90 Minutes Intravenous Every 48 hours 06/07/2022 2126  06/19/22 1214   05/25/2022 2200  vancomycin (VANCOREADY) IVPB 1250 mg/250 mL        1,250 mg 166.7 mL/hr over 90 Minutes Intravenous  Once 06/20/2022 2126 06/19/22 0808   06/09/2022 2200  ceFEPIme (MAXIPIME) 2 g in sodium chloride 0.9 % 100 mL IVPB        2 g 200 mL/hr over 30 Minutes Intravenous Every 24 hours 05/25/2022 2126     06/20/2022 1630  cefTRIAXone (ROCEPHIN) 1 g in sodium chloride 0.9 % 100 mL IVPB        1 g 200 mL/hr over 30 Minutes Intravenous  Once 06/17/2022 1624 06/17/2022 1830   06/13/2022 1630  doxycycline (VIBRA-TABS) tablet 100 mg        100 mg Oral  Once 06/09/2022 1624 05/25/2022 1749       Subjective: Seen and examined at bedside and the patient was doing about the same and thinks his respiratory status has not improved very much at all.  Continues to feel short of breath and felt dyspneic.  Denies any nausea or vomiting.  No other concerns or complaints at this time.  Objective: Vitals:   06/20/22 1553 06/20/22 1606 06/20/22 1637 06/20/22 1700  BP:  (!) 89/50  (!) 109/46  Pulse:  (!) 103 (!) 103 (!) 103  Resp:  16 19 (!) 22  Temp:  98.5 F (36.9 C)    TempSrc:      SpO2: 94%  92% 92%  Weight:        Intake/Output Summary (Last 24 hours) at 06/20/2022 1740 Last data filed at 06/20/2022 1709 Gross per 24 hour  Intake 1247.71 ml  Output 300 ml  Net 947.71 ml   Filed Weights   05/27/2022 2209 06/20/22 0431  Weight: 71.1 kg 80.7 kg    Examination: Physical Exam:  Constitutional: WN/WD chronically ill-appearing Caucasian elderly male in some respiratory distress on a nonrebreather Respiratory: Diminished to auscultation bilaterally with coarse breath sounds with some rhonchi and crackles.,  No appreciable wheezing or rales.  Has not increased respiratory effort and rate and is not using any accessory muscles but is wearing supplemental oxygen via nasal cannula as well as a nonrebreather Cardiovascular: RRR, no murmurs / rubs / gallops. S1 and S2 auscultated.  Has 1+  lower extremity edema Abdomen: Soft, non-tender, distended secondary to body habitus. Bowel sounds positive.  GU: Deferred. Musculoskeletal: No clubbing / cyanosis of digits/nails. No joint deformity upper and lower extremities. Skin: No rashes, lesions, ulcers on limited skin evaluation. No induration; Warm and dry.  Neurologic: CN 2-12 grossly intact with no focal deficits. Romberg sign and cerebellar reflexes not assessed.  Psychiatric: Normal judgment and insight. Alert and oriented x 3.  Slightly anxious mood and appropriate affect.   Data Reviewed: I have personally reviewed following labs and imaging studies  CBC: Recent Labs  Lab 05/30/2022 1705 06/19/22 0515 06/19/22 0825 06/20/22 0422 06/20/22 0940  WBC 4.5 5.4 5.5 5.1 6.1  NEUTROABS 3.7  --   --  4.6 5.3  HGB 8.6* 8.4* 9.0*  7.0* 7.4*  HCT 25.9* 26.2* 27.7* 21.7* 22.8*  MCV 89.6 90.7 89.9 91.2 91.2  PLT 190 193 204 176 888   Basic Metabolic Panel: Recent Labs  Lab 06/06/2022 1705 05/23/2022 2116 06/19/22 0515 06/19/22 0825 06/20/22 0422  NA 132*  --   --  137 134*  K 3.1*  --   --  3.3* 4.0  CL 100  --   --  104 105  CO2 23  --   --  21* 21*  GLUCOSE 201*  --   --  101* 249*  BUN 57*  --   --  51* 60*  CREATININE 2.42*  --  2.27* 2.25* 2.55*  CALCIUM 7.5*  --   --  7.6* 7.3*  MG 1.5* 1.3* 1.9  --  1.7  PHOS 4.3  --  3.9  --  4.1   GFR: Estimated Creatinine Clearance: 26.6 mL/min (A) (by C-G formula based on SCr of 2.55 mg/dL (H)). Liver Function Tests: Recent Labs  Lab 05/29/2022 1705 06/19/22 0825 06/20/22 0422  AST 43* 54* 40  ALT _0 ALKPHOS 52 51 50  BILITOT 0.8 0.7 0.5  PROT 5.5* 5.2* 4.6*  ALBUMIN 2.0* 1.7* 1.6*   No results for input(s): "LIPASE", "AMYLASE" in the last 168 hours. No results for input(s): "AMMONIA" in the last 168 hours. Coagulation Profile: Recent Labs  Lab 05/28/2022 1705  INR 1.2   Cardiac Enzymes: Recent Labs  Lab 05/27/2022 1705  CKTOTAL 63   BNP (last 3  results) Recent Labs    04/01/22 1548 04/10/22 1035  PROBNP 320.0* 575.0*   HbA1C: No results for input(s): "HGBA1C" in the last 72 hours. CBG: Recent Labs  Lab 06/20/22 0017 06/20/22 0412 06/20/22 0824 06/20/22 1232 06/20/22 1627  GLUCAP 297* 226* 103* 181* 211*   Lipid Profile: No results for input(s): "CHOL", "HDL", "LDLCALC", "TRIG", "CHOLHDL", "LDLDIRECT" in the last 72 hours. Thyroid Function Tests: Recent Labs    05/23/2022 2116  TSH 1.560   Anemia Panel: Recent Labs    06/04/2022 2116 06/20/22 0422  VITAMINB12 607 444  FOLATE 32.9 14.9  FERRITIN 2,780* 3,644*  TIBC 178* 150*  IRON 15* 24*  RETICCTPCT 1.6 2.0   Sepsis Labs: Recent Labs  Lab 06/02/2022 1705 06/19/22 0515 06/20/22 0422  PROCALCITON 0.73 0.53 0.79  LATICACIDVEN 0.9  --   --     Recent Results (from the past 240 hour(s))  Resp Panel by RT-PCR (Flu A&B, Covid) Porta Cath     Status: None   Collection Time: 05/22/2022  5:44 PM   Specimen: Porta Cath; Nasal Swab  Result Value Ref Range Status   SARS Coronavirus 2 by RT PCR NEGATIVE NEGATIVE Final    Comment: (NOTE) SARS-CoV-2 target nucleic acids are NOT DETECTED.  The SARS-CoV-2 RNA is generally detectable in upper respiratory specimens during the acute phase of infection. The lowest concentration of SARS-CoV-2 viral copies this assay can detect is 138 copies/mL. A negative result does not preclude SARS-Cov-2 infection and should not be used as the sole basis for treatment or other patient management decisions. A negative result may occur with  improper specimen collection/handling, submission of specimen other than nasopharyngeal swab, presence of viral mutation(s) within the areas targeted by this assay, and inadequate number of viral copies(<138 copies/mL). A negative result must be combined with clinical observations, patient history, and epidemiological information. The expected result is Negative.  Fact Sheet for Patients:   EntrepreneurPulse.com.au  Fact Sheet for  Healthcare Providers:  IncredibleEmployment.be  This test is no t yet approved or cleared by the [place] and  has been authorized for detection and/or diagnosis of SARS-CoV-2 by FDA under an Emergency Use Authorization (EUA). This EUA will remain  in effect (meaning this test can be used) for the duration of the COVID-19 declaration under Section 564(b)(1) of the Act, 21 U.S.C.section 360bbb-3(b)(1), unless the authorization is terminated  or revoked sooner.       Influenza A by PCR NEGATIVE NEGATIVE Final   Influenza B by PCR NEGATIVE NEGATIVE Final    Comment: (NOTE) The Xpert Xpress SARS-CoV-2/FLU/RSV plus assay is intended as an aid in the diagnosis of influenza from Nasopharyngeal swab specimens and should not be used as a sole basis for treatment. Nasal washings and aspirates are unacceptable for Xpert Xpress SARS-CoV-2/FLU/RSV testing.  Fact Sheet for Patients: EntrepreneurPulse.com.au  Fact Sheet for Healthcare Providers: IncredibleEmployment.be  This test is not yet approved or cleared by the [place] FDA and has been authorized for detection and/or diagnosis of SARS-CoV-2 by FDA under an Emergency Use Authorization (EUA). This EUA will remain in effect (meaning this test can be used) for the duration of the COVID-19 declaration under Section 564(b)(1) of the Act, 21 U.S.C. section 360bbb-3(b)(1), unless the authorization is terminated or revoked.  Performed at [facility], [place] [address], [place], [place] [address]   Respiratory (~20 pathogens) panel by PCR     Status: None   Collection Time: 06/02/2022  5:44 PM   Specimen: Nasopharyngeal Swab; Respiratory  Result Value Ref Range Status   Adenovirus NOT DETECTED NOT DETECTED Final   Coronavirus 229E NOT DETECTED NOT DETECTED Final    Comment: (NOTE) The Coronavirus  on the Respiratory Panel, DOES NOT test for the novel  Coronavirus (2019 nCoV)    Coronavirus HKU1 NOT DETECTED NOT DETECTED Final   Coronavirus NL63 NOT DETECTED NOT DETECTED Final   Coronavirus OC43 NOT DETECTED NOT DETECTED Final   Metapneumovirus NOT DETECTED NOT DETECTED Final   Rhinovirus / Enterovirus NOT DETECTED NOT DETECTED Final   Influenza A NOT DETECTED NOT DETECTED Final   Influenza B NOT DETECTED NOT DETECTED Final   Parainfluenza Virus 1 NOT DETECTED NOT DETECTED Final   Parainfluenza Virus 2 NOT DETECTED NOT DETECTED Final   Parainfluenza Virus 3 NOT DETECTED NOT DETECTED Final   Parainfluenza Virus 4 NOT DETECTED NOT DETECTED Final   Respiratory Syncytial Virus NOT DETECTED NOT DETECTED Final   Bordetella pertussis NOT DETECTED NOT DETECTED Final   Bordetella Parapertussis NOT DETECTED NOT DETECTED Final   Chlamydophila pneumoniae NOT DETECTED NOT DETECTED Final   Mycoplasma pneumoniae NOT DETECTED NOT DETECTED Final    Comment: Performed at [facility], [place]. [address], [place], [place] [address]  Blood culture (routine x 2)     Status: None (Preliminary result)   Collection Time: 05/24/2022  5:46 PM   Specimen: BLOOD  Result Value Ref Range Status   Specimen Description   Final    BLOOD RIGHT CHEST Performed at [place] [address], [place], [place] [address]    Special Requests   Final    BOTTLES DRAWN AEROBIC AND ANAEROBIC Blood Culture adequate volume Performed at [place] [address], [place], [place] [address]    Culture   Final    NO GROWTH 2 DAYS Performed at [place] [address], [place], [place] [address]  Report Status PENDING  Incomplete  Urine Culture     Status: Abnormal (Preliminary result)   Collection Time: 06/13/2022  8:53 PM   Specimen: Urine, Clean Catch  Result Value Ref Range Status   Specimen Description   Final    URINE, CLEAN CATCH Performed at [facility], [place] [address], [place], [place] [address]    Special Requests   Final    NONE Performed at [facility], [place] [address], [place], [place] [address]    Culture (A)  Final    40,000 COLONIES/mL ESCHERICHIA COLI SUSCEPTIBILITIES TO FOLLOW Performed at [facility], [place] [address] [place], [place] [address]    Report Status PENDING  Incomplete  MRSA Next Gen by PCR, Nasal     Status: None   Collection Time: 06/03/2022  9:11 PM   Specimen: Nasal Mucosa; Nasal Swab  Result Value Ref Range Status   MRSA by PCR Next Gen NOT DETECTED NOT DETECTED Final    Comment: (NOTE) The GeneXpert MRSA Assay (FDA approved for NASAL specimens only), is one component of a comprehensive MRSA colonization surveillance program. It is not intended to diagnose MRSA infection nor to guide or monitor treatment for MRSA infections. Test performance is not FDA approved in patients less than 70 years old. Performed at [facility], [place] [address], [place], [place] [address]   MRSA Next Gen by PCR, Nasal     Status: None   Collection Time: 06/19/22  8:39 PM   Specimen: Nasal Mucosa; Nasal Swab  Result Value Ref Range Status   MRSA by PCR Next Gen NOT DETECTED NOT DETECTED Final    Comment: (NOTE) The GeneXpert MRSA Assay (FDA approved for NASAL specimens only), is one component of a comprehensive MRSA colonization surveillance program. It is not intended to diagnose MRSA infection nor to guide or monitor treatment for MRSA infections. Test performance is not FDA approved in patients less than 61 years old. Performed at [facility], [place] [address], [place], [place] [address]     Radiology Studies: DG CHEST PORT 1 VIEW  Result Date: 06/20/2022 CLINICAL DATA:  71 year old male presenting with shortness of breath. EXAM: PORTABLE CHEST 1 VIEW COMPARISON:  June 18, 2022. FINDINGS: EKG leads project over the chest.  RIGHT-sided Port-A-Cath terminates at the caval to atrial junction as before. Changes of median sternotomy for CABG redemonstrated. Cardiomediastinal contours and hilar structures are stable. Diffuse interstitial and alveolar opacity throughout the RIGHT and LEFT chest with moderate increased since previous imaging. Suggestion of developing Peri fissural consolidative changes along the minor fissure in the RIGHT chest and at the RIGHT lung base with potential pleural fluid. IMPRESSION: 1. Diffuse interstitial and alveolar opacity throughout the RIGHT and LEFT chest with moderate increased since previous imaging. A baseline there is some interstitial prominence and ground-glass that was seen on previous imaging now more diffuse. Superimposed edema, infection of for pneumonitis are considered. 2. Potential slight interval enlargement of pleural fluid in the RIGHT chest since previous imaging. Electronically Signed   By: Zetta Bills M.D.   On: 06/20/2022 08:14   VAS [place] LOWER EXTREMITY VENOUS (DVT)  Result Date: 06/19/2022  Lower Venous DVT Study Patient Name:  FLETCHER OSTERMILLER  Date of Exam:   06/19/2022 Medical Rec #: [id number]           Accession #:    [id number] Date of Birth: [birth year]  Patient Gender: M Patient Age:   28 years Exam Location:  [facility] Procedure:      VAS [place] LOWER EXTREMITY VENOUS (DVT) Referring Phys: Adrian Prows --------------------------------------------------------------------------------  Indications: Swelling.  Risk Factors: Cancer. Limitations: Poor ultrasound/tissue interface. Comparison Study: No prior studies. Performing Technologist: Oliver Hum RVT  Examination Guidelines: A complete evaluation includes B-mode imaging, spectral Doppler, color Doppler, and power Doppler as needed of all accessible portions of each vessel. Bilateral testing is considered an integral part of a complete examination. Limited examinations for reoccurring indications may be  performed as noted. The reflux portion of the exam is performed with the patient in reverse Trendelenburg.  +---------+---------------+---------+-----------+----------+--------------+ RIGHT    CompressibilityPhasicitySpontaneityPropertiesThrombus Aging +---------+---------------+---------+-----------+----------+--------------+ CFV      Full           Yes      Yes                                 +---------+---------------+---------+-----------+----------+--------------+ SFJ      Full                                                        +---------+---------------+---------+-----------+----------+--------------+ FV Prox  Full                                                        +---------+---------------+---------+-----------+----------+--------------+ FV Mid   Full           Yes      Yes                                 +---------+---------------+---------+-----------+----------+--------------+ FV DistalFull                                                        +---------+---------------+---------+-----------+----------+--------------+ PFV      Full                                                        +---------+---------------+---------+-----------+----------+--------------+ POP      Full           Yes      Yes                                 +---------+---------------+---------+-----------+----------+--------------+ PTV      Full                                                        +---------+---------------+---------+-----------+----------+--------------+ PERO  Partial                                      Acute          +---------+---------------+---------+-----------+----------+--------------+   +---------+---------------+---------+-----------+----------+--------------+ LEFT     CompressibilityPhasicitySpontaneityPropertiesThrombus Aging +---------+---------------+---------+-----------+----------+--------------+ CFV      Full            Yes      Yes                                 +---------+---------------+---------+-----------+----------+--------------+ SFJ      Full                                                        +---------+---------------+---------+-----------+----------+--------------+ FV Prox  Full                                                        +---------+---------------+---------+-----------+----------+--------------+ FV Mid   Full           Yes      Yes                                 +---------+---------------+---------+-----------+----------+--------------+ FV DistalFull           Yes      Yes                                 +---------+---------------+---------+-----------+----------+--------------+ PFV      Full                                                        +---------+---------------+---------+-----------+----------+--------------+ POP      None           No       No                                  +---------+---------------+---------+-----------+----------+--------------+ PTV      Full                                                        +---------+---------------+---------+-----------+----------+--------------+ PERO     Full                                                        +---------+---------------+---------+-----------+----------+--------------+     Summary:  RIGHT: - Findings consistent with acute deep vein thrombosis involving the right peroneal veins. - No cystic structure found in the popliteal fossa.  LEFT: - Findings consistent with acute deep vein thrombosis involving the left popliteal vein. - No cystic structure found in the popliteal fossa.  *See table(s) above for measurements and observations. Electronically signed by Harold Barban MD on 06/19/2022 at 7:45:56 PM.    Final    ECHOCARDIOGRAM COMPLETE  Result Date: 06/19/2022    ECHOCARDIOGRAM REPORT   Patient Name:   ADEMOLA VERT Date of Exam: 06/19/2022 Medical  Rec #:  [id number]          Height:       70.0 in Accession #:    [id number]         Weight:       156.7 lb Date of Birth:  [birth year]         BSA:          1.882 m Patient Age:    75 years           BP:           143/76 mmHg Patient Gender: M                  HR:           111 bpm. Exam Location:  Inpatient Procedure: 2D Echo and Intracardiac Opacification Agent Indications:     elevated troponins  History:         Patient has no prior history of Echocardiogram examinations,                  most recent 02/24/2022. CAD, PAD; Risk Factors:Hypertension and                  Dyslipidemia.  Sonographer:     Harvie Junior Referring Phys:  [place] Diagnosing Phys: [place] IMPRESSIONS  1. Wall motion challenging to assess with tachycardia and challenging windows. Left ventricular ejection fraction, by estimation, is 55 to 60%. The left ventricle has normal function. The left ventricle has no regional wall motion abnormalities. There is mild left ventricular hypertrophy. Left ventricular diastolic parameters are indeterminate.  2. Right ventricular systolic function is normal. The right ventricular size is normal.  3. No evidence of mitral valve regurgitation.  4. Aortic valve regurgitation is not visualized. Aortic valve sclerosis/calcification is present, without any evidence of aortic stenosis.  5. The inferior vena cava is normal in size with greater than 50% respiratory variability, suggesting right atrial pressure of 3 mmHg. Comparison(s): No significant change from prior study. FINDINGS  Left Ventricle: Wall motion challenging to assess with tachycardia and challenging windows. Left ventricular ejection fraction, by estimation, is 55 to 60%. The left ventricle has normal function. The left ventricle has no regional wall motion abnormalities. Definity contrast agent was given IV to delineate the left ventricular endocardial borders. The left ventricular internal cavity size was normal in size. There  is mild left ventricular hypertrophy. Abnormal (paradoxical) septal motion consistent with post-operative status. Left ventricular diastolic parameters are indeterminate. Right Ventricle: The right ventricular size is normal. Right ventricular systolic function is normal. Left Atrium: Left atrial size was normal in size. Right Atrium: Right atrial size was normal in size. Pericardium: There is no evidence of pericardial effusion. Mitral Valve: No evidence of mitral valve regurgitation. Tricuspid Valve: Tricuspid valve regurgitation is mild. Aortic Valve: Aortic valve regurgitation is not visualized. Aortic valve sclerosis/calcification is  present, without any evidence of aortic stenosis. Aortic valve mean gradient measures 4.0 mmHg. Aortic valve peak gradient measures 7.1 mmHg. Aortic valve  area, by VTI measures 2.47 cm. Pulmonic Valve: Pulmonic valve regurgitation is not visualized. Venous: The inferior vena cava is normal in size with greater than 50% respiratory variability, suggesting right atrial pressure of 3 mmHg. IAS/Shunts: The interatrial septum was not well visualized.  LEFT VENTRICLE PLAX 2D LVIDd:         4.20 cm     Diastology LVIDs:         2.70 cm     LV e' medial:    12.10 cm/s LV PW:         0.90 cm     LV E/e' medial:  6.6 LV IVS:        1.00 cm     LV e' lateral:   10.90 cm/s LVOT diam:     2.20 cm     LV E/e' lateral: 7.3 LV SV:         47 LV SV Index:   25 LVOT Area:     3.80 cm  LV Volumes (MOD) LV vol d, MOD A2C: 97.0 ml LV vol d, MOD A4C: 91.8 ml LV vol s, MOD A2C: 40.7 ml LV vol s, MOD A4C: 44.3 ml LV SV MOD A2C:     56.3 ml LV SV MOD A4C:     91.8 ml LV SV MOD BP:      52.8 ml RIGHT VENTRICLE RV Basal diam:  4.20 cm RV Mid diam:    3.30 cm RV S prime:     12.60 cm/s TAPSE (M-mode): 1.9 cm LEFT ATRIUM             Index        RIGHT ATRIUM           Index LA Vol (A2C):   29.0 ml 15.41 ml/m  RA Area:     15.60 cm LA Vol (A4C):   30.6 ml 16.26 ml/m  RA Volume:   35.30 ml  18.76 ml/m LA  Biplane Vol: 32.3 ml 17.16 ml/m  AORTIC VALVE                    PULMONIC VALVE AV Area (Vmax):    3.00 cm     PV Vmax:       0.88 m/s AV Area (Vmean):   2.73 cm     PV Peak grad:  3.1 mmHg AV Area (VTI):     2.47 cm AV Vmax:           133.00 cm/s AV Vmean:          89.600 cm/s AV VTI:            0.191 m AV Peak Grad:      7.1 mmHg AV Mean Grad:      4.0 mmHg LVOT Vmax:         105.00 cm/s LVOT Vmean:        64.300 cm/s LVOT VTI:          0.124 m LVOT/AV VTI ratio: 0.65  AORTA Ao Root diam: 3.70 cm Ao Asc diam:  3.40 cm MITRAL VALVE                TRICUSPID VALVE MV Area (PHT): 5.06 cm     TR Peak grad:   42.0 mmHg MV Decel Time: 150 msec     TR  Vmax:        324.00 cm/s MV E velocity: 80.10 cm/s MV A velocity: 114.00 cm/s  SHUNTS MV E/A ratio:  0.70         Systemic VTI:  0.12 m                             Systemic Diam: 2.20 cm Phineas Inches Electronically signed by Phineas Inches Signature Date/Time: 06/19/2022/12:17:33 PM    Final (Updated)    ECHOCARDIOGRAM COMPLETE  Result Date: 06/19/2022    ECHOCARDIOGRAM REPORT   Patient Name:   AERIK POLAN Date of Exam: 06/19/2022 Medical Rec #:  [id number]          Height:       70.0 in Accession #:    [id number]         Weight:       156.7 lb Date of Birth:  Jan 29, 1951         BSA:          1.882 m Patient Age:    35 years           BP:           143/76 mmHg Patient Gender: M                  HR:           111 bpm. Exam Location:  Inpatient Procedure: 2D Echo and Intracardiac Opacification Agent Indications:    elevated troponins  History:        Patient has no prior history of Echocardiogram examinations,                 most recent 02/24/2022. CAD, PAD; Risk Factors:Hypertension and                 Dyslipidemia.  Sonographer:    Harvie Junior Referring Phys: [place]  1. Wall motion challenging to assess with tachycardia and challenging windows. Left ventricular ejection fraction, by estimation, is 55 to 60%. The left ventricle has  normal function. The left ventricle has no regional wall motion abnormalities. There is mild left ventricular hypertrophy. Left ventricular diastolic parameters are indeterminate.  2. Right ventricular systolic function is normal. The right ventricular size is normal.  3. No evidence of mitral valve regurgitation.  4. Aortic valve regurgitation is not visualized. Aortic valve sclerosis/calcification is present, without any evidence of aortic stenosis.  5. The inferior vena cava is normal in size with greater than 50% respiratory variability, suggesting right atrial pressure of 3 mmHg. Comparison(s): No significant change from prior study. FINDINGS  Left Ventricle: Wall motion challenging to assess with tachycardia and challenging windows. Left ventricular ejection fraction, by estimation, is 55 to 60%. The left ventricle has normal function. The left ventricle has no regional wall motion abnormalities. Definity contrast agent was given IV to delineate the left ventricular endocardial borders. The left ventricular internal cavity size was normal in size. There is mild left ventricular hypertrophy. Abnormal (paradoxical) septal motion consistent with post-operative status. Left ventricular diastolic parameters are indeterminate. Right Ventricle: The right ventricular size is normal. Right ventricular systolic function is normal. Left Atrium: Left atrial size was normal in size. Right Atrium: Right atrial size was normal in size. Pericardium: There is no evidence of pericardial effusion. Mitral Valve: No evidence of mitral valve regurgitation. Tricuspid Valve: Tricuspid valve regurgitation is mild. Aortic Valve: Aortic valve regurgitation  is not visualized. Aortic valve sclerosis/calcification is present, without any evidence of aortic stenosis. Aortic valve mean gradient measures 4.0 mmHg. Aortic valve peak gradient measures 7.1 mmHg. Aortic valve  area, by VTI measures 2.47 cm. Pulmonic Valve: Pulmonic valve  regurgitation is not visualized. Venous: The inferior vena cava is normal in size with greater than 50% respiratory variability, suggesting right atrial pressure of 3 mmHg. IAS/Shunts: The interatrial septum was not well visualized.  LEFT VENTRICLE PLAX 2D LVIDd:         4.20 cm     Diastology LVIDs:         2.70 cm     LV e' medial:    12.10 cm/s LV PW:         0.90 cm     LV E/e' medial:  6.6 LV IVS:        1.00 cm     LV e' lateral:   10.90 cm/s LVOT diam:     2.20 cm     LV E/e' lateral: 7.3 LV SV:         47 LV SV Index:   25 LVOT Area:     3.80 cm  LV Volumes (MOD) LV vol d, MOD A2C: 97.0 ml LV vol d, MOD A4C: 91.8 ml LV vol s, MOD A2C: 40.7 ml LV vol s, MOD A4C: 44.3 ml LV SV MOD A2C:     56.3 ml LV SV MOD A4C:     91.8 ml LV SV MOD BP:      52.8 ml RIGHT VENTRICLE RV Basal diam:  4.20 cm RV Mid diam:    3.30 cm RV S prime:     12.60 cm/s TAPSE (M-mode): 1.9 cm LEFT ATRIUM             Index        RIGHT ATRIUM           Index LA Vol (A2C):   29.0 ml 15.41 ml/m  RA Area:     15.60 cm LA Vol (A4C):   30.6 ml 16.26 ml/m  RA Volume:   35.30 ml  18.76 ml/m LA Biplane Vol: 32.3 ml 17.16 ml/m  AORTIC VALVE                    PULMONIC VALVE AV Area (Vmax):    3.00 cm     PV Vmax:       0.88 m/s AV Area (Vmean):   2.73 cm     PV Peak grad:  3.1 mmHg AV Area (VTI):     2.47 cm AV Vmax:           133.00 cm/s AV Vmean:          89.600 cm/s AV VTI:            0.191 m AV Peak Grad:      7.1 mmHg AV Mean Grad:      4.0 mmHg LVOT Vmax:         105.00 cm/s LVOT Vmean:        64.300 cm/s LVOT VTI:          0.124 m LVOT/AV VTI ratio: 0.65  AORTA Ao Root diam: 3.70 cm Ao Asc diam:  3.40 cm MITRAL VALVE                TRICUSPID VALVE MV Area (PHT): 5.06 cm     TR Peak grad:   42.0 mmHg MV Decel Time:  150 msec     TR Vmax:        324.00 cm/s MV E velocity: 80.10 cm/s MV A velocity: 114.00 cm/s  SHUNTS MV E/A ratio:  0.70         Systemic VTI:  0.12 m                             Systemic Diam: 2.20 cm Phineas Inches  Electronically signed by Phineas Inches Signature Date/Time: 06/19/2022/12:17:33 PM    Final    DG Chest Portable 1 View  Result Date: 06/13/2022 CLINICAL DATA:  Per chart: Patient has shortness of breath and feeling weak where he cannot walk the past 2 days. He received a CT scan yesterday that showed an infection. Started doxycycline. Patient has lung cancer stage 3, currently getting chemo. EXAM: PORTABLE CHEST - 1 VIEW COMPARISON:  05/20/2022 FINDINGS: Stable right IJ port to the proximal right atrium. Worsening coarse interstitial throughout both lungs, with some coarse airspace opacities throughout the right upper lung. Heart size and mediastinal contours are within normal limits. CABG markers. Blunting of the right lateral costophrenic angle suggesting small effusion. Sternotomy wires.  Cervical fixation hardware partially visualized. IMPRESSION: 1. Worsening coarse interstitial and right upper lung airspace opacities. 2. Small right effusion. Electronically Signed   By: Lucrezia Europe M.D.   On: 06/07/2022 19:33    Scheduled Meds:  sodium chloride   Intravenous Once   aspirin EC  81 mg Oral Daily   brimonidine  2 drop Both Eyes BID   Chlorhexidine Gluconate Cloth  6 each Topical Daily   cilostazol  100 mg Oral BID   fenofibrate  160 mg Oral Daily   insulin aspart  0-9 Units Subcutaneous Q4H   ipratropium-albuterol  3 mL Nebulization QID   isosorbide-hydrALAZINE  1 tablet Oral TID   levothyroxine  50 mcg Oral QAC breakfast   magnesium chloride  1 tablet Oral Daily   methylPREDNISolone (SOLU-MEDROL) injection  125 mg Intravenous Daily   metoprolol succinate  25 mg Oral Daily   mirtazapine  7.5 mg Oral QHS   pantoprazole  40 mg Oral Daily   potassium chloride  10 mEq Oral BID   rosuvastatin  20 mg Oral Daily   sodium chloride flush  10-40 mL Intracatheter Q12H   torsemide  20 mg Oral BID   Continuous Infusions:  ceFEPime (MAXIPIME) IV Stopped (06/19/22 2241)   dexmedetomidine (PRECEDEX)  IV infusion Stopped (06/20/22 1616)   heparin 1,650 Units/hr (06/20/22 1709)    LOS: 2 days   Raiford Noble, DO [facility] Available via Epic secure chat 7am-7pm After these hours, please refer to coverage provider listed on [email or web address] 06/20/2022, 5:40 PM

## 2022-06-20 NOTE — Progress Notes (Signed)
Patient was placed on heated high flow @ 35L / 80%. Patient appears to be tolerating well at this time. RT will continue to monitor as wean as tolerated.

## 2022-06-20 NOTE — Plan of Care (Signed)
  Problem: Education: Goal: Ability to describe self-care measures that may prevent or decrease complications (Diabetes Survival Skills Education) will improve Outcome: Progressing Goal: Individualized Educational Video(s) Outcome: Progressing   Problem: Coping: Goal: Ability to adjust to condition or change in health will improve Outcome: Progressing   Problem: Fluid Volume: Goal: Ability to maintain a balanced intake and output will improve Outcome: Progressing   Problem: Health Behavior/Discharge Planning: Goal: Ability to identify and utilize available resources and services will improve Outcome: Progressing Goal: Ability to manage health-related needs will improve Outcome: Progressing   Problem: Metabolic: Goal: Ability to maintain appropriate glucose levels will improve Outcome: Progressing   Problem: Nutritional: Goal: Maintenance of adequate nutrition will improve Outcome: Progressing Goal: Progress toward achieving an optimal weight will improve Outcome: Progressing   Problem: Skin Integrity: Goal: Risk for impaired skin integrity will decrease Outcome: Progressing   Problem: Tissue Perfusion: Goal: Adequacy of tissue perfusion will improve Outcome: Progressing   Problem: Education: Goal: Knowledge of General Education information will improve Description: Including pain rating scale, medication(s)/side effects and non-pharmacologic comfort measures Outcome: Progressing   Problem: Health Behavior/Discharge Planning: Goal: Ability to manage health-related needs will improve Outcome: Progressing   Problem: Clinical Measurements: Goal: Ability to maintain clinical measurements within normal limits will improve Outcome: Progressing Goal: Will remain free from infection Outcome: Progressing Goal: Diagnostic test results will improve Outcome: Progressing Goal: Respiratory complications will improve Outcome: Progressing Goal: Cardiovascular complication will  be avoided Outcome: Progressing   Problem: Activity: Goal: Risk for activity intolerance will decrease Outcome: Progressing   Problem: Nutrition: Goal: Adequate nutrition will be maintained Outcome: Progressing   Problem: Coping: Goal: Level of anxiety will decrease Outcome: Progressing   Problem: Elimination: Goal: Will not experience complications related to bowel motility Outcome: Progressing Goal: Will not experience complications related to urinary retention Outcome: Progressing   Problem: Safety: Goal: Ability to remain free from injury will improve Outcome: Progressing   Problem: Skin Integrity: Goal: Risk for impaired skin integrity will decrease Outcome: Progressing   Problem: Clinical Measurements: Goal: Ability to maintain clinical measurements within normal limits will improve Outcome: Progressing Goal: Respiratory complications will improve Outcome: Progressing   Problem: Activity: Goal: Risk for activity intolerance will decrease Outcome: Progressing   Problem: Coping: Goal: Level of anxiety will decrease Outcome: Progressing

## 2022-06-20 NOTE — Progress Notes (Signed)
PT Cancellation Note  Patient Details Name: Jorge Adams MRN: [id number] DOB: [birth year]   Cancelled Treatment:    Reason Eval/Treat Not Completed: Medical issues which prohibited therapy, remains on NRB . [place] Office [phone] Weekend pager-[phone]    Claretha Cooper 06/20/2022, 7:05 AM

## 2022-06-20 NOTE — TOC Initial Note (Signed)
Transition of Care [facility]) - Initial/Assessment Note    Patient Details  Name: Jorge Adams MRN: [id number] Date of Birth: 1951-02-27  [facility]) CM/SW Contact:    Leeroy Cha, RN Phone Number: 06/20/2022, 7:31 AM  Clinical Narrative:                  Transition of Care [facility]) Screening Note   Patient Details  Name: Jorge Adams Date of Birth: 22-May-1951   Transition of [facility]) CM/SW Contact:    Leeroy Cha, RN Phone Number: 06/20/2022, 7:31 AM    Transition of Care Department [facility]) has reviewed patient and no TOC needs have been identified at this time. We will continue to monitor patient advancement through interdisciplinary progression rounds. If new patient transition needs arise, please place a TOC consult.    Expected Discharge Plan: Home/Self Care Barriers to Discharge: Continued Medical Work up   Patient Goals and CMS Choice Patient states their goals for this hospitalization and ongoing recovery are:: to return home CMS Medicare.gov Compare Post Acute Care list provided to:: Patient Choice offered to / list presented to : Patient  Expected Discharge Plan and Services Expected Discharge Plan: Home/Self Care   Discharge Planning Services: CM Consult   Living arrangements for the past 2 months: Single Family Home                                      Prior Living Arrangements/Services Living arrangements for the past 2 months: Single Family Home Lives with:: Spouse Patient language and need for interpreter reviewed:: Yes Do you feel safe going back to the place where you live?: Yes            Criminal Activity/Legal Involvement Pertinent to Current Situation/Hospitalization: No - Comment as needed  Activities of Daily Living   ADL Screening (condition at time of admission) Patient's cognitive ability adequate to safely complete daily activities?: Yes Is the patient deaf or have difficulty hearing?:  No Does the patient have difficulty seeing, even when wearing glasses/contacts?: No Does the patient have difficulty concentrating, remembering, or making decisions?: No Patient able to express need for assistance with ADLs?: Yes Does the patient have difficulty dressing or bathing?: No Independently performs ADLs?: No  Permission Sought/Granted                  Emotional Assessment Appearance:: Appears stated age Attitude/Demeanor/Rapport: Engaged Affect (typically observed): Calm Orientation: : Oriented to Self, Oriented to Place, Oriented to  Time, Oriented to Situation Alcohol / Substance Use: Never Used Psych Involvement: No (comment)  Admission diagnosis:  Hypocalcemia [E83.51] Hypokalemia [E87.6] Hypomagnesemia [E83.42] SOB (shortness of breath) [R06.02] CAP (community acquired pneumonia) [J18.9] Acute respiratory failure with hypoxia ([place]) [J96.01] Multifocal pneumonia [J18.9] Patient Active Problem List   Diagnosis Date Noted   UTI (urinary tract infection) 06/19/2022   Nonspecific abnormal electrocardiogram (ECG) (EKG) 06/19/2022   CKD (chronic kidney disease) stage 4, GFR 15-29 ml/min ([place]) 06/19/2022   Abnormal cardiac enzyme level 06/19/2022   CAP (community acquired pneumonia) 05/28/2022   CKD (chronic kidney disease), stage IV ([place]) 06/15/2022   Chronic diastolic CHF (congestive heart failure) ([place]) 05/31/2022   Acute respiratory failure with hypoxia ([place]) 05/23/2022   Elevated troponin 06/08/2022   Elevated serum creatinine 05/22/2022   Diarrhea 05/12/2022   Gout 04/14/2022   Hypocalcemia 04/14/2022   Creatinine elevation 04/10/2022  Hypomagnesemia 04/10/2022   Hypokalemia 04/10/2022   AKI (acute kidney injury) ([place]) 04/10/2022   Serum sickness due to drug 02/28/2022   Rash 02/26/2022   Pressure injury of skin 02/23/2022   Lobar pneumonia ([place]) 02/22/2022   Sepsis ([place]) 02/22/2022   Dysphagia 02/22/2022   Hyponatremia 02/22/2022   Normocytic  anemia 02/22/2022   Stage III squamous cell carcinoma of right lung ([place]) 12/07/2021   Goals of care, counseling/discussion 12/07/2021   Mass of lower lobe of right lung 11/09/2021   Cough 10/29/2021   Acute bronchitis 10/29/2021   Stenosis of cervical spine with myelopathy ([place]) 05/07/2018   Paresthesia 03/15/2018   Health care maintenance 09/04/2017   Snoring 92/05/9416   Umbilical hernia 40/81/4481   CAD (coronary artery disease) 01/02/2016   Angina pectoris ([place]) 12/18/2015   Routine general medical examination at a health care facility 12/07/2014   Advance care planning 12/07/2014   PAD (peripheral artery disease) ([place]) 12/05/2013   Erectile dysfunction associated with type 2 diabetes mellitus ([place]) 10/31/2013   Type 2 diabetes mellitus with vascular disease ([place]) 10/31/2013   Senile nuclear sclerosis 06/04/2013   Borderline glaucoma with ocular hypertension 06/04/2013   Amblyopia, unspecified 06/04/2013   ASHD (arteriosclerotic heart disease) 01/28/2011   Hypogonadism, male 01/28/2011   Hypertension associated with diabetes ([place]) 01/28/2011   Hyperlipidemia LDL goal <70 01/28/2011   Obesity (BMI 30-39.9) 01/28/2011   PCP:  [name], MD Pharmacy:   [id number] - [place], [place] [address] Phone: [phone] Fax: [phone]     Social Determinants of Health (SDOH) Interventions    Readmission Risk Interventions   No data to display

## 2022-06-20 NOTE — Progress Notes (Signed)
RT placed pt back on HHF [place] so he could eat his meal.

## 2022-06-20 NOTE — Progress Notes (Signed)
RT placed patient on HF 15L , Patient appears to be tolerating well at this time

## 2022-06-20 NOTE — Progress Notes (Signed)
ANTICOAGULATION CONSULT NOTE - Follow Up Consult  Pharmacy Consult for Heparin Indication: chest pain/ACS, DVT/PE   Allergies  Allergen Reactions   Metformin And Related     Held due to creatinine elevation September 2023.   Budesonide Rash    Drug rash with oral Tarpeyo     Patient Measurements: Weight: 80.7 kg (177 lb 14.6 oz) Heparin Dosing Weight:  80.7 kg  Vital Signs: Temp: 98.1 F (36.7 C) (11/30 2000) Temp Source: Axillary (11/30 2000) BP: 116/61 (11/30 2300) Pulse Rate: 93 (11/30 2300)  Labs: Recent Labs    06/03/2022 1705 06/07/2022 2230 06/19/22 0215 06/19/22 0515 06/19/22 0825 06/19/22 1838 06/20/22 0422 06/20/22 0940 06/20/22 1417 06/20/22 2252  HGB 8.6*  --   --  8.4* 9.0*  --  7.0* 7.4*  --   --   HCT 25.9*  --   --  26.2* 27.7*  --  21.7* 22.8*  --   --   PLT 190  --   --  193 204  --  176 169  --   --   LABPROT 15.0  --   --   --   --   --   --   --   --   --   INR 1.2  --   --   --   --   --   --   --   --   --   HEPARINUNFRC  --   --   --   --  <0.10*   < > 0.20*  --  0.23* 0.43  CREATININE 2.42*  --   --  2.27* 2.25*  --  2.55*  --   --   --   CKTOTAL 63  --   --   --   --   --   --   --   --   --   TROPONINIHS 2,602*   < > 2,990* 2,574* 2,623*  --   --   --   --   --    < > = values in this interval not displayed.     Estimated Creatinine Clearance: 26.6 mL/min (A) (by C-G formula based on SCr of 2.55 mg/dL (H)).   Assessment: AC/Heme: IV heparin for ACS + DVT and presumed PE LE doppler: Findings consistent with acute deep vein thrombosis involving the right peroneal veins and left popliteal vein.  - Hep level 0.23 remains low, Hgb 7.4, Plts 169   2252 HL 0.43 therapeutic on 1650 units/hr No bleeding noted  Goal of Therapy:  Heparin level 0.3-0.7 units/ml Monitor platelets by anticoagulation protocol: Yes   Plan:  Continue heparin drip at 1650 units/hr. Recheck heparin level in 8 hrs. Daily HL and CBC  Dolly Rias  RPh 06/20/2022, 11:12 PM

## 2022-06-21 ENCOUNTER — Other Ambulatory Visit: Payer: Self-pay | Admitting: Cardiology

## 2022-06-21 ENCOUNTER — Inpatient Hospital Stay (HOSPITAL_COMMUNITY): Payer: Medicare HMO

## 2022-06-21 DIAGNOSIS — J9601 Acute respiratory failure with hypoxia: Secondary | ICD-10-CM | POA: Diagnosis not present

## 2022-06-21 DIAGNOSIS — E7800 Pure hypercholesterolemia, unspecified: Secondary | ICD-10-CM

## 2022-06-21 LAB — GLUCOSE, CAPILLARY
Glucose-Capillary: 166 mg/dL — ABNORMAL HIGH (ref 70–99)
Glucose-Capillary: 212 mg/dL — ABNORMAL HIGH (ref 70–99)
Glucose-Capillary: 259 mg/dL — ABNORMAL HIGH (ref 70–99)
Glucose-Capillary: 91 mg/dL (ref 70–99)
Glucose-Capillary: 98 mg/dL (ref 70–99)
Glucose-Capillary: 99 mg/dL (ref 70–99)

## 2022-06-21 LAB — COMPREHENSIVE METABOLIC PANEL
ALT: 18 U/L (ref 0–44)
AST: 45 U/L — ABNORMAL HIGH (ref 15–41)
Albumin: 1.8 g/dL — ABNORMAL LOW (ref 3.5–5.0)
Alkaline Phosphatase: 77 U/L (ref 38–126)
Anion gap: 9 (ref 5–15)
BUN: 65 mg/dL — ABNORMAL HIGH (ref 8–23)
CO2: 20 mmol/L — ABNORMAL LOW (ref 22–32)
Calcium: 8 mg/dL — ABNORMAL LOW (ref 8.9–10.3)
Chloride: 109 mmol/L (ref 98–111)
Creatinine, Ser: 2.72 mg/dL — ABNORMAL HIGH (ref 0.61–1.24)
GFR, Estimated: 24 mL/min — ABNORMAL LOW (ref >60)
Glucose, Bld: 107 mg/dL — ABNORMAL HIGH (ref 70–99)
Potassium: 4.8 mmol/L (ref 3.5–5.1)
Sodium: 138 mmol/L (ref 135–145)
Total Bilirubin: 0.6 mg/dL (ref 0.3–1.2)
Total Protein: 5.4 g/dL — ABNORMAL LOW (ref 6.5–8.1)

## 2022-06-21 LAB — BPAM RBC
Blood Product Expiration Date: 202312262359
ISSUE DATE / TIME: 202311301539
Unit Type and Rh: 5100

## 2022-06-21 LAB — CBC WITH DIFFERENTIAL/PLATELET
Abs Immature Granulocytes: 0.03 10*3/uL (ref 0.00–0.07)
Basophils Absolute: 0 10*3/uL (ref 0.0–0.1)
Basophils Relative: 0 %
Eosinophils Absolute: 0 10*3/uL (ref 0.0–0.5)
Eosinophils Relative: 0 %
HCT: 27.6 % — ABNORMAL LOW (ref 39.0–52.0)
Hemoglobin: 9.1 g/dL — ABNORMAL LOW (ref 13.0–17.0)
Immature Granulocytes: 0 %
Lymphocytes Relative: 8 %
Lymphs Abs: 0.6 10*3/uL — ABNORMAL LOW (ref 0.7–4.0)
MCH: 29.3 pg (ref 26.0–34.0)
MCHC: 33 g/dL (ref 30.0–36.0)
MCV: 88.7 fL (ref 80.0–100.0)
Monocytes Absolute: 0.2 10*3/uL (ref 0.1–1.0)
Monocytes Relative: 2 %
Neutro Abs: 7.1 10*3/uL (ref 1.7–7.7)
Neutrophils Relative %: 90 %
Platelets: 188 10*3/uL (ref 150–400)
RBC: 3.11 MIL/uL — ABNORMAL LOW (ref 4.22–5.81)
RDW: 16.8 % — ABNORMAL HIGH (ref 11.5–15.5)
WBC: 7.9 10*3/uL (ref 4.0–10.5)
nRBC: 0 % (ref 0.0–0.2)

## 2022-06-21 LAB — URINE CULTURE: Culture: 40000 — AB

## 2022-06-21 LAB — TYPE AND SCREEN
ABO/RH(D): O POS
Antibody Screen: NEGATIVE
Unit division: 0

## 2022-06-21 LAB — BRAIN NATRIURETIC PEPTIDE: B Natriuretic Peptide: 359 pg/mL — ABNORMAL HIGH (ref 0.0–100.0)

## 2022-06-21 LAB — LACTATE DEHYDROGENASE: LDH: 380 U/L — ABNORMAL HIGH (ref 98–192)

## 2022-06-21 LAB — HEPARIN LEVEL (UNFRACTIONATED): Heparin Unfractionated: 0.53 IU/mL (ref 0.30–0.70)

## 2022-06-21 LAB — PHOSPHORUS: Phosphorus: 4.1 mg/dL (ref 2.5–4.6)

## 2022-06-21 LAB — MAGNESIUM: Magnesium: 1.7 mg/dL (ref 1.7–2.4)

## 2022-06-21 LAB — SEDIMENTATION RATE: Sed Rate: 80 mm/hr — ABNORMAL HIGH (ref 0–16)

## 2022-06-21 LAB — C-REACTIVE PROTEIN: CRP: 12.2 mg/dL — ABNORMAL HIGH (ref <1.0)

## 2022-06-21 MED ORDER — MELATONIN 5 MG PO TABS
5.0000 mg | ORAL_TABLET | Freq: Once | ORAL | Status: AC | PRN
  Administered 2022-06-22: 5 mg via ORAL
  Filled 2022-06-21: qty 1

## 2022-06-21 MED ORDER — BOOST / RESOURCE BREEZE PO LIQD CUSTOM
1.0000 | Freq: Two times a day (BID) | ORAL | Status: DC
  Administered 2022-06-21 – 2022-06-22 (×2): 1 via ORAL

## 2022-06-21 MED ORDER — NETARSUDIL-LATANOPROST 0.02-0.005 % OP SOLN
1.0000 [drp] | Freq: Every day | OPHTHALMIC | Status: DC
  Administered 2022-06-21 – 2022-06-25 (×5): 1 [drp] via OPHTHALMIC

## 2022-06-21 MED ORDER — DORZOLAMIDE HCL-TIMOLOL MAL 2-0.5 % OP SOLN
1.0000 [drp] | Freq: Two times a day (BID) | OPHTHALMIC | Status: DC
  Administered 2022-06-21 – 2022-06-26 (×10): 1 [drp] via OPHTHALMIC
  Filled 2022-06-21: qty 10

## 2022-06-21 NOTE — Progress Notes (Signed)
Pharmacy Antibiotic Note  Jorge Adams is a 71 y.o. male admitted on 05/22/2022 with PNA.  Pharmacy was consulted for vancomycin and cefepime dosing. Vancomycin discontinued on 11/29 and remains on day 4 of cefepime (provider notes state to continue cefepime for total of 7 days).  Plan: Continue Cefepime 2 g IV q24 hr  Weight: 79.5 kg (175 lb 4.3 oz)  Temp (24hrs), Avg:98.3 F (36.8 C), Min:98 F (36.7 C), Max:98.6 F (37 C)  Recent Labs  Lab 06/16/2022 1705 06/19/22 0515 06/19/22 0825 06/20/22 0422 06/20/22 0940 06/21/22 0439  WBC 4.5 5.4 5.5 5.1 6.1 7.9  CREATININE 2.42* 2.27* 2.25* 2.55*  --  2.72*  LATICACIDVEN 0.9  --   --   --   --   --      Estimated Creatinine Clearance: 25 mL/min (A) (by C-G formula based on SCr of 2.72 mg/dL (H)).    Allergies  Allergen Reactions   Metformin And Related     Held due to creatinine elevation September 2023.   Budesonide Rash    Drug rash with oral Tarpeyo     Thank you for allowing pharmacy to be a part of this patient's care.  Suzzanne Cloud, PharmD, BCPS 06/21/2022 11:20 AM

## 2022-06-21 NOTE — Progress Notes (Signed)
OT Cancellation Note  Patient Details Name: Jorge Adams MRN: [id number] DOB: 05/06/1951   Cancelled Treatment:    Reason Eval/Treat Not Completed: Medical issues which prohibited therapy. RN requests therapy hold due to tenuous respiratory status. Will follow.   [name] [name] 06/21/2022, 10:58 AM

## 2022-06-21 NOTE — Progress Notes (Addendum)
eLink Physician-Brief Progress Note Patient Name: Jorge Adams DOB: Jan 15, 1951 MRN: [id number]   Date of Service  06/21/2022  HPI/Events of Note  Notified by bedside RN that patient is anxious but is concerned about resuming Precedex as it had dropped his BP during the day Patient seen awake and tolerating BiPap  BP 128/64  eICU Interventions  Discussed with bedside RN who suggested melatonin which I have agreed to and ordered     Intervention Category Intermediate Interventions: Other:  Judd Lien 06/21/2022, 11:40 PM

## 2022-06-21 NOTE — Progress Notes (Signed)
PT Cancellation Note  Patient Details Name: Jorge Adams MRN: [id number] DOB: Dec 06, 1950   Cancelled Treatment:     PT deferred this date at request or RN 2* pt's tenuous respiratory status.  Will follow.   Tishara Pizano 06/21/2022, 11:40 AM

## 2022-06-21 NOTE — Progress Notes (Signed)
ANTICOAGULATION CONSULT NOTE - Follow Up Consult  Pharmacy Consult for Heparin Indication: chest pain/ACS, DVT/PE   Allergies  Allergen Reactions   Metformin And Related     Held due to creatinine elevation September 2023.   Budesonide Rash    Drug rash with oral Tarpeyo     Patient Measurements: Weight: 79.5 kg (175 lb 4.3 oz) Heparin Dosing Weight:  80.7 kg  Vital Signs: Temp: 98.5 F (36.9 C) (12/01 0828) Temp Source: Axillary (12/01 0828) BP: 182/96 (12/01 0915) Pulse Rate: 110 (12/01 0915)  Labs: Recent Labs    06/11/2022 1705 06/17/2022 2230 06/19/22 0215 06/19/22 0515 06/19/22 0825 06/19/22 1838 06/20/22 0422 06/20/22 0940 06/20/22 1417 06/20/22 2252 06/21/22 0439  HGB 8.6*  --   --  8.4* 9.0*  --  7.0* 7.4*  --   --  9.1*  HCT 25.9*  --   --  26.2* 27.7*  --  21.7* 22.8*  --   --  27.6*  PLT 190  --   --  193 204  --  176 169  --   --  188  LABPROT 15.0  --   --   --   --   --   --   --   --   --   --   INR 1.2  --   --   --   --   --   --   --   --   --   --   HEPARINUNFRC  --   --   --   --  <0.10*   < > 0.20*  --  0.23* 0.43 0.53  CREATININE 2.42*  --   --  2.27* 2.25*  --  2.55*  --   --   --  2.72*  CKTOTAL 63  --   --   --   --   --   --   --   --   --   --   TROPONINIHS 2,602*   < > 2,990* 2,574* 2,623*  --   --   --   --   --   --    < > = values in this interval not displayed.     Estimated Creatinine Clearance: 25 mL/min (A) (by C-G formula based on SCr of 2.72 mg/dL (H)).   Assessment: AC/Heme: IV heparin for ACS + DVT and presumed PE LE doppler: Findings consistent with acute deep vein thrombosis involving the right peroneal veins and left popliteal vein.   0439 HL 0.53 therapeutic with heparin infusing at  1650 units/hr Hgb up 9.1, PLT WNL No bleeding noted  Goal of Therapy:  Heparin level 0.3-0.7 units/ml Monitor platelets by anticoagulation protocol: Yes   Plan:  Continue heparin drip at 1650 units/hr. Monitor daily heparin level,  CBC, signs/symptoms of bleeding    Thank you for allowing pharmacy to be a part of this patient's care.  Royetta Asal, PharmD, BCPS Clinical Pharmacist [place] Please utilize Amion for appropriate phone number to reach the unit pharmacist ([place]) 06/21/2022 11:15 AM

## 2022-06-21 NOTE — Progress Notes (Signed)
NAME:  Jorge Adams, MRN:  [id number], DOB:  12-02-1950, LOS: 3 ADMISSION DATE:  06/16/2022, CONSULTATION DATE:  06/19/22 REFERRING MD:  Alfredia Ferguson CHIEF COMPLAINT:  respiratory failure   History of Present Illness:  Jorge Adams is a 71 year old male with Stage IIIb squamous cell carcinoma non-small cell lung cancer with large right lower lobe mass with satellite nodules and mediastinal adenopathy diagnosed in May 2023. He completed 6 cycles caboplatin/paclitaxel, last dose 02/04/22. He was delayed in starting consolidative immunotherapy due to deconditioning and radiation induced esophagitis. He was treated with steroid taper in 02/2022 for a couple of weeks. He then started durvalumab on 9/7. He was admitted to the hospital 9/20 to 9/27 for acute kidney injury and nephrotic syndrome. He had renal biopsy on 9/26 with findings of ATN and Focal Proliferative Glomerulonephritis with IgA dominant immune-complex deposition. He has been following with renal and patient's wife reports he was started on steroid treatment for the renal disease. He had second durvalumab infusion on 04/24/22.  He presented 11/28 with 3 months of cough and 3 weeks progressive dyspnea, orthopnea. +wheezing and subjective fevers. On no O2 at home. Found to have acute hypoxemic respiratory failure due to multifocal pneumonitis and presumed PE with +LE duplex.   He has been started on heparin drip for DVT and presumed PE. Echo 11/29 does not indicate right heart strain.   Pertinent  Medical History  3b NSCLC: last radiation 02/08/2022.  S/P carboplatin, paclitaxel x 6 cycles also finished in July.  He is now s/p 2 cycles of consolidation immunotherapy with imfinzi  last dose seems to have been beginning of this month.   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 He slowly recovered back to the 90s. He remains on bipap this morning. No increase in work of breathing per nursing. Patient is without complaints at this time. Wife is at bedside.  Objective   Blood pressure 138/79, pulse (!) 111, temperature 98 F (36.7 C), temperature source Axillary, resp. rate (!) 26, weight 79.5 kg, SpO2 95 %.    FiO2 (%):  [45 %-100 %] 100 %   Intake/Output Summary (Last 24 hours) at 06/21/2022 0809 Last data filed at 06/21/2022 0555 Gross per 24 hour  Intake 983.04 ml  Output 1900 ml  Net -916.96 ml   Filed Weights   06/11/2022 2209 06/20/22 0431 06/21/22 0500  Weight: 71.1 kg 80.7 kg 79.5 kg    Examination: General: elderly male, mild distress, bipap inplace HENT: Sclera anicteric, moist mucous membranes Lungs: course breath sounds, crackles on right, diminished on right, no wheezing Cardiovascular: tachycardic, no murmurs Abdomen: soft, non-tender, non-distended, BS+ Extremities: warm, no edema Neuro: alert, oriented, moving all extremities GU: n/a  Resolved Hospital Problem list     Assessment & Plan:  Acute Hypoxemic Respiratory Failure Drug induced Pneumonitis Suspected Pulmonary Emboli Stage IIIB non-small cell lung cancer - Goal SpO2 92-95% - Continue alternating between HHFNC/Non-rebreather and Bipap - Continue cefepime for total of 7 days - Check LDH and fungitell for concern of pneumocystis given history of high steroid use recently -  continue solumedrol for concern of pneumonitis  - Diuresis with torsemide 20mg  daily   Acute Bilateral DVT - continue heparin drip per pharmacy  Focal Proliferative Glomerulonephritis with IgA dominant immune-complex deposition CKD IIIb-IV Metabolic Acidosis -  Nephrology consulted - Cr remains near his baseline since 03/2022 - Non-oliguric at this time - monitor electrolytes and serumb bicarb  Acute on Chronic Diastolic Heart Failure Hypertension Coronary Artery Disease PAD Type II NSTEMI - Continue bidil, metoprolol  - Continue aspirin 81 mg daily, cilostazol 100mg  daily, and rosuvastatin 20mg  daily - daily torsemide  Diabetes Mellitus II with vascular disease - SSI  Best Practice (right click and "Reselect all SmartList Selections" daily)   Diet/type: NPO w/ oral meds DVT prophylaxis: systemic heparin GI prophylaxis: N/A Lines: N/A Foley:  N/A Code Status:  full code Last date of multidisciplinary goals of care discussion [12/1, plan to hold off on intubation as long as possible]  Labs   CBC: Recent Labs  Lab 06/09/2022 1705 06/19/22 0515 06/19/22 0825 06/20/22 0422 06/20/22 0940 06/21/22 0439  WBC 4.5 5.4 5.5 5.1 6.1 7.9  NEUTROABS 3.7  --   --  4.6 5.3 7.1  HGB 8.6* 8.4* 9.0* 7.0* 7.4* 9.1*  HCT 25.9* 26.2* 27.7* 21.7* 22.8* 27.6*  MCV 89.6 90.7 89.9 91.2 91.2 88.7  PLT 190 193 204 176 169 952    Basic Metabolic Panel: Recent Labs  Lab 05/29/2022 1705 06/08/2022 2116 06/19/22 0515 06/19/22 0825 06/20/22 0422 06/21/22 0439  NA 132*  --   --  137 134* 138  K 3.1*  --   --  3.3* 4.0 4.8  CL 100  --   --  104 105 109  CO2 23  --   --  21* 21* 20*  GLUCOSE 201*  --   --  101* 249* 107*  BUN 57*  --   --  51* 60* 65*  CREATININE 2.42*  --  2.27* 2.25* 2.55* 2.72*  CALCIUM 7.5*  --   --  7.6* 7.3* 8.0*  MG 1.5* 1.3* 1.9  --  1.7 1.7  PHOS 4.3  --  3.9  --  4.1 4.1   GFR: Estimated Creatinine Clearance: 25 mL/min (A) (by C-G formula based on SCr of 2.72 mg/dL (H)). Recent Labs  Lab 06/01/2022 1705 06/19/22 0515 06/19/22 0825 06/20/22 0422 06/20/22 0940 06/21/22 0439  PROCALCITON 0.73 0.53  --  0.79  --   --   WBC 4.5 5.4 5.5 5.1 6.1 7.9  LATICACIDVEN 0.9  --   --   --   --   --     Liver Function  Tests: Recent Labs  Lab 06/06/2022 1705 06/19/22 0825 06/20/22 0422 06/21/22 0439  AST 43* 54* 40 45*  ALT 21 20 18 18   ALKPHOS 52 51 50 77  BILITOT 0.8 0.7 0.5 0.6  PROT 5.5* 5.2* 4.6* 5.4*  ALBUMIN 2.0* 1.7* 1.6* 1.8*   No results for input(s): "LIPASE", "AMYLASE" in the last 168 hours. No results for input(s): "AMMONIA" in the last 168 hours.  ABG    Component Value Date/Time   PHART 7.326 (L) 01/02/2016 2059   PCO2ART 41.8 01/02/2016 2059   PO2ART 62.0 (L) 01/02/2016 2059   HCO3 25.4 06/12/2022 2116   TCO2 29 01/05/2016 1654   ACIDBASEDEF 4.0 (H) 01/02/2016 2059   O2SAT 65.8 06/05/2022 2116     Coagulation Profile: Recent Labs  Lab 06/05/2022 1705  INR 1.2    Cardiac Enzymes: Recent Labs  Lab  06/13/2022 1705  CKTOTAL 63    HbA1C: Hgb A1c MFr Bld  Date/Time Value Ref Range Status  02/22/2022 08:10 AM 6.6 (H) 4.8 - 5.6 % Final    Comment:    (NOTE) Pre diabetes:          5.7%-6.4%  Diabetes:              >6.4%  Glycemic control for   <7.0% adults with diabetes   10/29/2021 10:16 AM 6.4 4.6 - 6.5 % Final    Comment:    Glycemic Control Guidelines for People with Diabetes:Non Diabetic:  <6%Goal of Therapy: <7%Additional Action Suggested:  >8%     CBG: Recent Labs  Lab 06/20/22 0824 06/20/22 1232 06/20/22 1627 06/20/22 1956 06/21/22 0454  GLUCAP 103* 181* 211* 191* 91     Critical care time: 85 minutes    Freda Jackson, MD [facility] Office: [phone]   See Amion for personal pager PCCM on call pager [phone] until 7pm. Please call [place] 7p-7a. [phone]

## 2022-06-21 DEATH — deceased

## 2022-06-22 ENCOUNTER — Inpatient Hospital Stay (HOSPITAL_COMMUNITY): Payer: Medicare HMO

## 2022-06-22 DIAGNOSIS — J9601 Acute respiratory failure with hypoxia: Secondary | ICD-10-CM | POA: Diagnosis not present

## 2022-06-22 LAB — CBC
HCT: 26.1 % — ABNORMAL LOW (ref 39.0–52.0)
Hemoglobin: 8.4 g/dL — ABNORMAL LOW (ref 13.0–17.0)
MCH: 29.1 pg (ref 26.0–34.0)
MCHC: 32.2 g/dL (ref 30.0–36.0)
MCV: 90.3 fL (ref 80.0–100.0)
Platelets: 177 10*3/uL (ref 150–400)
RBC: 2.89 MIL/uL — ABNORMAL LOW (ref 4.22–5.81)
RDW: 16.9 % — ABNORMAL HIGH (ref 11.5–15.5)
WBC: 5.9 10*3/uL (ref 4.0–10.5)
nRBC: 0 % (ref 0.0–0.2)

## 2022-06-22 LAB — GLUCOSE, CAPILLARY
Glucose-Capillary: 111 mg/dL — ABNORMAL HIGH (ref 70–99)
Glucose-Capillary: 129 mg/dL — ABNORMAL HIGH (ref 70–99)
Glucose-Capillary: 137 mg/dL — ABNORMAL HIGH (ref 70–99)
Glucose-Capillary: 155 mg/dL — ABNORMAL HIGH (ref 70–99)
Glucose-Capillary: 170 mg/dL — ABNORMAL HIGH (ref 70–99)
Glucose-Capillary: 225 mg/dL — ABNORMAL HIGH (ref 70–99)
Glucose-Capillary: 69 mg/dL — ABNORMAL LOW (ref 70–99)

## 2022-06-22 LAB — COMPREHENSIVE METABOLIC PANEL
ALT: 18 U/L (ref 0–44)
AST: 38 U/L (ref 15–41)
Albumin: 1.7 g/dL — ABNORMAL LOW (ref 3.5–5.0)
Alkaline Phosphatase: 108 U/L (ref 38–126)
Anion gap: 9 (ref 5–15)
BUN: 63 mg/dL — ABNORMAL HIGH (ref 8–23)
CO2: 21 mmol/L — ABNORMAL LOW (ref 22–32)
Calcium: 8 mg/dL — ABNORMAL LOW (ref 8.9–10.3)
Chloride: 109 mmol/L (ref 98–111)
Creatinine, Ser: 2.72 mg/dL — ABNORMAL HIGH (ref 0.61–1.24)
GFR, Estimated: 24 mL/min — ABNORMAL LOW (ref >60)
Glucose, Bld: 127 mg/dL — ABNORMAL HIGH (ref 70–99)
Potassium: 4 mmol/L (ref 3.5–5.1)
Sodium: 139 mmol/L (ref 135–145)
Total Bilirubin: 0.6 mg/dL (ref 0.3–1.2)
Total Protein: 5.2 g/dL — ABNORMAL LOW (ref 6.5–8.1)

## 2022-06-22 LAB — BLOOD GAS, ARTERIAL
Acid-base deficit: 4.2 mmol/L — ABNORMAL HIGH (ref 0.0–2.0)
Bicarbonate: 22.1 mmol/L (ref 20.0–28.0)
O2 Saturation: 100 %
Patient temperature: 37
pCO2 arterial: 45 mmHg (ref 32–48)
pH, Arterial: 7.3 — ABNORMAL LOW (ref 7.35–7.45)
pO2, Arterial: 225 mmHg — ABNORMAL HIGH (ref 83–108)

## 2022-06-22 LAB — HEPARIN LEVEL (UNFRACTIONATED): Heparin Unfractionated: 0.41 IU/mL (ref 0.30–0.70)

## 2022-06-22 LAB — BRAIN NATRIURETIC PEPTIDE: B Natriuretic Peptide: 443.6 pg/mL — ABNORMAL HIGH (ref 0.0–100.0)

## 2022-06-22 MED ORDER — FENTANYL CITRATE (PF) 100 MCG/2ML IJ SOLN
INTRAMUSCULAR | Status: AC
  Administered 2022-06-22: 50 ug
  Filled 2022-06-22: qty 2

## 2022-06-22 MED ORDER — MIDAZOLAM HCL 2 MG/2ML IJ SOLN
2.0000 mg | Freq: Once | INTRAMUSCULAR | Status: AC
  Administered 2022-06-22: 2 mg via INTRAVENOUS
  Filled 2022-06-22: qty 2

## 2022-06-22 MED ORDER — NOREPINEPHRINE 4 MG/250ML-% IV SOLN
INTRAVENOUS | Status: AC
  Filled 2022-06-22: qty 250

## 2022-06-22 MED ORDER — FENTANYL BOLUS VIA INFUSION
25.0000 ug | INTRAVENOUS | Status: DC | PRN
  Administered 2022-06-26: 100 ug via INTRAVENOUS

## 2022-06-22 MED ORDER — MIDAZOLAM HCL 2 MG/2ML IJ SOLN
INTRAMUSCULAR | Status: AC
  Filled 2022-06-22: qty 2

## 2022-06-22 MED ORDER — DOCUSATE SODIUM 50 MG/5ML PO LIQD
100.0000 mg | Freq: Two times a day (BID) | ORAL | Status: DC
  Administered 2022-06-22 – 2022-06-26 (×8): 100 mg
  Filled 2022-06-22 (×8): qty 10

## 2022-06-22 MED ORDER — NOREPINEPHRINE 4 MG/250ML-% IV SOLN
0.0000 ug/min | INTRAVENOUS | Status: DC
  Administered 2022-06-22: 16 ug/min via INTRAVENOUS
  Administered 2022-06-22: 6 ug/min via INTRAVENOUS
  Administered 2022-06-23: 12 ug/min via INTRAVENOUS
  Administered 2022-06-23: 6 ug/min via INTRAVENOUS
  Administered 2022-06-23 – 2022-06-25 (×3): 8 ug/min via INTRAVENOUS
  Administered 2022-06-25: 9 ug/min via INTRAVENOUS
  Administered 2022-06-25: 8 ug/min via INTRAVENOUS
  Administered 2022-06-26: 6 ug/min via INTRAVENOUS
  Filled 2022-06-22 (×10): qty 250

## 2022-06-22 MED ORDER — SODIUM CHLORIDE 0.9 % IV SOLN
1.0000 g | Freq: Two times a day (BID) | INTRAVENOUS | Status: DC
  Administered 2022-06-22 – 2022-06-26 (×9): 1 g via INTRAVENOUS
  Filled 2022-06-22 (×9): qty 20

## 2022-06-22 MED ORDER — POTASSIUM CHLORIDE 20 MEQ/15ML (10%) PO SOLN
10.0000 meq | Freq: Two times a day (BID) | ORAL | Status: DC
  Administered 2022-06-22 – 2022-06-23 (×3): 10 meq
  Filled 2022-06-22 (×4): qty 15

## 2022-06-22 MED ORDER — CILOSTAZOL 100 MG PO TABS
100.0000 mg | ORAL_TABLET | Freq: Two times a day (BID) | ORAL | Status: DC
  Administered 2022-06-22 – 2022-06-26 (×8): 100 mg
  Filled 2022-06-22 (×8): qty 1

## 2022-06-22 MED ORDER — ATOVAQUONE 750 MG/5ML PO SUSP
750.0000 mg | Freq: Two times a day (BID) | ORAL | Status: DC
  Administered 2022-06-22: 750 mg via ORAL
  Filled 2022-06-22: qty 5

## 2022-06-22 MED ORDER — FENTANYL 2500MCG IN NS 250ML (10MCG/ML) PREMIX INFUSION
25.0000 ug/h | INTRAVENOUS | Status: DC
  Administered 2022-06-22: 25 ug/h via INTRAVENOUS
  Administered 2022-06-23: 100 ug/h via INTRAVENOUS
  Administered 2022-06-26: 175 ug/h via INTRAVENOUS
  Filled 2022-06-22 (×5): qty 250

## 2022-06-22 MED ORDER — FENTANYL CITRATE PF 50 MCG/ML IJ SOSY: 25.0000 ug | PREFILLED_SYRINGE | Freq: Once | INTRAMUSCULAR | Status: AC

## 2022-06-22 MED ORDER — MORPHINE SULFATE (PF) 2 MG/ML IV SOLN
2.0000 mg | Freq: Once | INTRAVENOUS | Status: AC
  Administered 2022-06-22: 2 mg via INTRAVENOUS
  Filled 2022-06-22: qty 1

## 2022-06-22 MED ORDER — POLYETHYLENE GLYCOL 3350 17 G PO PACK
17.0000 g | PACK | Freq: Every day | ORAL | Status: DC
  Administered 2022-06-22 – 2022-06-26 (×5): 17 g
  Filled 2022-06-22 (×6): qty 1

## 2022-06-22 MED ORDER — ATOVAQUONE 750 MG/5ML PO SUSP
750.0000 mg | Freq: Two times a day (BID) | ORAL | Status: DC
  Administered 2022-06-23 – 2022-06-26 (×7): 750 mg
  Filled 2022-06-22 (×7): qty 5

## 2022-06-22 MED ORDER — MIRTAZAPINE 15 MG PO TABS
7.5000 mg | ORAL_TABLET | Freq: Every day | ORAL | Status: DC
  Administered 2022-06-22 – 2022-06-25 (×4): 7.5 mg
  Filled 2022-06-22 (×4): qty 1

## 2022-06-22 MED ORDER — PROPOFOL 1000 MG/100ML IV EMUL
0.0000 ug/kg/min | INTRAVENOUS | Status: DC
  Administered 2022-06-22: 5 ug/kg/min via INTRAVENOUS
  Administered 2022-06-23: 10 ug/kg/min via INTRAVENOUS
  Administered 2022-06-23: 20 ug/kg/min via INTRAVENOUS
  Administered 2022-06-24: 10 ug/kg/min via INTRAVENOUS
  Administered 2022-06-25 – 2022-06-26 (×4): 20 ug/kg/min via INTRAVENOUS
  Filled 2022-06-22 (×8): qty 100

## 2022-06-22 MED ORDER — ROCURONIUM BROMIDE 10 MG/ML (PF) SYRINGE
PREFILLED_SYRINGE | INTRAVENOUS | Status: AC
  Administered 2022-06-22: 100 mg
  Filled 2022-06-22: qty 10

## 2022-06-22 MED ORDER — FENTANYL CITRATE PF 50 MCG/ML IJ SOSY
50.0000 ug | PREFILLED_SYRINGE | Freq: Once | INTRAMUSCULAR | Status: AC
  Administered 2022-06-22: 50 ug via INTRAVENOUS
  Filled 2022-06-22: qty 1

## 2022-06-22 MED ORDER — ETOMIDATE 2 MG/ML IV SOLN
INTRAVENOUS | Status: AC
  Administered 2022-06-22: 20 mg
  Filled 2022-06-22: qty 20

## 2022-06-22 MED ORDER — MIDAZOLAM HCL 2 MG/2ML IJ SOLN
INTRAMUSCULAR | Status: AC
  Administered 2022-06-22: 2 mg
  Filled 2022-06-22: qty 2

## 2022-06-22 NOTE — Procedures (Signed)
Intubation Procedure Note  Jorge Adams  [id number]  05-10-1951  Date:06/22/22  Time:5:51 PM   Provider Performing:Arilla Hice B Kailana Benninger    Procedure: Intubation (31500)  Indication(s) Respiratory Failure  Consent Risks of the procedure as well as the alternatives and risks of each were explained to the patient and/or caregiver.  Consent for the procedure was obtained and is signed in the bedside chart   Anesthesia Etomidate, Versed, Fentanyl, and Rocuronium   Time Out Verified patient identification, verified procedure, site/side was marked, verified correct patient position, special equipment/implants available, medications/allergies/relevant history reviewed, required imaging and test results available.   Sterile Technique Usual hand hygeine, masks, and gloves were used   Procedure Description Patient positioned in bed supine.  Sedation given as noted above.  Patient was intubated with endotracheal tube using Glidescope.  View was Grade 1 full glottis .  Number of attempts was 1.  Colorimetric CO2 detector was consistent with tracheal placement.   Complications/Tolerance None; patient tolerated the procedure well. Chest X-ray is ordered to verify placement.   EBL Minimal   Specimen(s) None

## 2022-06-22 NOTE — Progress Notes (Signed)
ANTICOAGULATION CONSULT NOTE - Follow Up Consult  Pharmacy Consult for Heparin Indication: chest pain/ACS, DVT/PE   Allergies  Allergen Reactions   Metformin And Related     Held due to creatinine elevation September 2023.   Budesonide Rash    Drug rash with oral Tarpeyo     Patient Measurements: Weight: 79.5 kg (175 lb 4.3 oz) Heparin Dosing Weight:  80.7 kg  Vital Signs: Temp: 97.6 F (36.4 C) (12/02 0329) Temp Source: Oral (12/02 0329) BP: 178/90 (12/02 0400) Pulse Rate: 110 (12/02 0400)  Labs: Recent Labs    06/19/22 0825 06/19/22 1838 06/20/22 0422 06/20/22 0940 06/20/22 1417 06/20/22 2252 06/21/22 0439 06/22/22 0500  HGB 9.0*  --  7.0* 7.4*  --   --  9.1* 8.4*  HCT 27.7*  --  21.7* 22.8*  --   --  27.6* 26.1*  PLT 204  --  176 169  --   --  188 177  HEPARINUNFRC <0.10*   < > 0.20*  --    < > 0.43 0.53 0.41  CREATININE 2.25*  --  2.55*  --   --   --  2.72*  --   TROPONINIHS 2,623*  --   --   --   --   --   --   --    < > = values in this interval not displayed.     Estimated Creatinine Clearance: 25 mL/min (A) (by C-G formula based on SCr of 2.72 mg/dL (H)).   Assessment: AC/Heme: IV heparin for ACS + DVT and presumed PE LE doppler: Findings consistent with acute deep vein thrombosis involving the right peroneal veins and left popliteal vein.   06/22/2022 HL 0.41 therapeutic on 1650 units/hr Hgb 8.4, plts 177 No bleeding noted   Goal of Therapy:  Heparin level 0.3-0.7 units/ml Monitor platelets by anticoagulation protocol: Yes   Plan:  Continue heparin drip at 1650 units/hr. Monitor daily heparin level, CBC, signs/symptoms of bleeding    Thank you for allowing pharmacy to be a part of this patient's care.  Dolly Rias RPh 06/22/2022, 5:42 AM

## 2022-06-22 NOTE — Progress Notes (Signed)
NAME:  Jorge Adams, MRN:  [id number], DOB:  [birth year], LOS: 4 ADMISSION DATE:  06/10/2022, CONSULTATION DATE:  06/19/22 REFERRING MD:  Alfredia Ferguson CHIEF COMPLAINT:  respiratory failure   History of Present Illness:  Jorge Adams is a 71 year old male with Stage IIIb squamous cell carcinoma non-small cell lung cancer with large right lower lobe mass with satellite nodules and mediastinal adenopathy diagnosed in May 2023. He completed 6 cycles caboplatin/paclitaxel, last dose 02/04/22. He was delayed in starting consolidative immunotherapy due to deconditioning and radiation induced esophagitis. He was treated with steroid taper in 02/2022 for a couple of weeks. He then started durvalumab on 9/7. He was admitted to the hospital 9/20 to 9/27 for acute kidney injury and nephrotic syndrome. He had renal biopsy on 9/26 with findings of ATN and Focal Proliferative Glomerulonephritis with IgA dominant immune-complex deposition. He has been following with renal and patient's wife reports he was started on steroid treatment for the renal disease. He had second durvalumab infusion on 04/24/22.  He presented 11/28 with 3 months of cough and 3 weeks progressive dyspnea, orthopnea. +wheezing and subjective fevers. On no O2 at home. Found to have acute hypoxemic respiratory failure due to multifocal pneumonitis and presumed PE with +LE duplex.   He has been started on heparin drip for DVT and presumed PE. Echo 11/29 does not indicate right heart strain.   Pertinent  Medical History  3b NSCLC: last radiation 02/08/2022.  S/P carboplatin, paclitaxel x 6 cycles also finished in July.  He is now s/p 2 cycles of consolidation immunotherapy with imfinzi  last dose seems to have been beginning of this month.   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 rate (!) 28, weight 81 kg, SpO2 94 %.    FiO2 (%):  [60 %-100 %] 99 %   Intake/Output Summary (Last 24 hours) at 06/22/2022 0802 Last data filed at 06/22/2022 8546 Gross per 24 hour  Intake 606.85 ml  Output 2000 ml  Net -1393.15 ml   Filed Weights   06/20/22 0431 06/21/22 0500 06/22/22 0645  Weight: 80.7 kg 79.5 kg 81 kg    Examination: General: elderly male, moderate distress, bipap inplace HENT: Sclera anicteric, moist mucous membranes Lungs: course breath sounds, crackles on right, diminished on right, no wheezing Cardiovascular: tachycardic, no murmurs Abdomen: soft, non-tender, non-distended, BS+ Extremities: warm, no edema Neuro: alert, oriented, moving all extremities GU: n/a  Resolved Hospital Problem list     Assessment & Plan:  Acute Hypoxemic Respiratory Failure Drug induced Pneumonitis Suspected Pulmonary Emboli Stage IIIB non-small cell lung cancer - Goal SpO2 92-95% - Continue alternating between HHFNC/Non-rebreather and Bipap - Change from cefepime to meropenem and add atovaquone for empiric pneumocystis treatment as his respiratory failure progresses. LDH elevated. Follow up fungitell - continue solumedrol for concern of pneumonitis  - Diuresis with torsemide 20mg  twice daily   Acute Bilateral DVT - continue heparin drip per pharmacy  Focal Proliferative Glomerulonephritis with IgA dominant  immune-complex deposition CKD IIIb-IV Metabolic Acidosis - Nephrology consulted - Cr remains near his baseline since 03/2022 - Non-oliguric at this time - monitor electrolytes and serumb bicarb  Acute on Chronic  Diastolic Heart Failure Hypertension Coronary Artery Disease PAD Type II NSTEMI - Continue bidil, metoprolol  - Continue aspirin 81 mg daily, cilostazol 100mg  daily, and rosuvastatin 20mg  daily - twice daily torsemide  Diabetes Mellitus II with vascular disease - SSI  Best Practice (right click and "Reselect all SmartList Selections" daily)   Diet/type: Regular consistency (see orders) DVT prophylaxis: systemic heparin GI prophylaxis: N/A Lines: N/A Foley:  N/A Code Status:  full code Last date of multidisciplinary goals of care discussion [12/1, plan to hold off on intubation as long as possible]  Labs   CBC: Recent Labs  Lab 06/17/2022 1705 06/19/22 0515 06/19/22 0825 06/20/22 0422 06/20/22 0940 06/21/22 0439 06/22/22 0500  WBC 4.5   < > 5.5 5.1 6.1 7.9 5.9  NEUTROABS 3.7  --   --  4.6 5.3 7.1  --   HGB 8.6*   < > 9.0* 7.0* 7.4* 9.1* 8.4*  HCT 25.9*   < > 27.7* 21.7* 22.8* 27.6* 26.1*  MCV 89.6   < > 89.9 91.2 91.2 88.7 90.3  PLT 190   < > 204 176 169 188 177   < > = values in this interval not displayed.    Basic Metabolic Panel: Recent Labs  Lab 05/23/2022 1705 06/07/2022 2116 06/19/22 0515 06/19/22 0825 06/20/22 0422 06/21/22 0439  NA 132*  --   --  137 134* 138  K 3.1*  --   --  3.3* 4.0 4.8  CL 100  --   --  104 105 109  CO2 23  --   --  21* 21* 20*  GLUCOSE 201*  --   --  101* 249* 107*  BUN 57*  --   --  51* 60* 65*  CREATININE 2.42*  --  2.27* 2.25* 2.55* 2.72*  CALCIUM 7.5*  --   --  7.6* 7.3* 8.0*  MG 1.5* 1.3* 1.9  --  1.7 1.7  PHOS 4.3  --  3.9  --  4.1 4.1   GFR: Estimated Creatinine Clearance: 25 mL/min (A) (by C-G formula based on SCr of 2.72 mg/dL (H)). Recent Labs  Lab 05/31/2022 1705 06/19/22 0515 06/19/22 0825 06/20/22 0422 06/20/22 0940 06/21/22 0439 06/22/22 0500  PROCALCITON 0.73 0.53  --  0.79  --   --   --   WBC 4.5 5.4   < > 5.1 6.1 7.9 5.9  LATICACIDVEN 0.9  --   --   --   --   --   --    < > = values in this interval not  displayed.    Liver Function Tests: Recent Labs  Lab 05/22/2022 1705 06/19/22 0825 06/20/22 0422 06/21/22 0439  AST 43* 54* 40 45*  ALT 21 20 18 18   ALKPHOS 52 51 50 77  BILITOT 0.8 0.7 0.5 0.6  PROT 5.5* 5.2* 4.6* 5.4*  ALBUMIN 2.0* 1.7* 1.6* 1.8*   No results for input(s): "LIPASE", "AMYLASE" in the last 168 hours. No results for input(s): "AMMONIA" in the last 168 hours.  ABG    Component Value Date/Time   PHART 7.326 (L) 01/02/2016 2059   PCO2ART 41.8 01/02/2016 2059   PO2ART 62.0 (L) 01/02/2016 2059   HCO3 25.4 06/13/2022 2116   TCO2 29 01/05/2016 1654   ACIDBASEDEF 4.0 (H) 01/02/2016 2059  O2SAT 65.8 06/12/2022 2116     Coagulation Profile: Recent Labs  Lab 06/14/2022 1705  INR 1.2    Cardiac Enzymes: Recent Labs  Lab 05/30/2022 1705  CKTOTAL 63    HbA1C: Hgb A1c MFr Bld  Date/Time Value Ref Range Status  02/22/2022 08:10 AM 6.6 (H) 4.8 - 5.6 % Final    Comment:    (NOTE) Pre diabetes:          5.7%-6.4%  Diabetes:              >6.4%  Glycemic control for   <7.0% adults with diabetes   10/29/2021 10:16 AM 6.4 4.6 - 6.5 % Final    Comment:    Glycemic Control Guidelines for People with Diabetes:Non Diabetic:  <6%Goal of Therapy: <7%Additional Action Suggested:  >8%     CBG: Recent Labs  Lab 06/21/22 1601 06/21/22 2000 06/21/22 2337 06/22/22 0327 06/22/22 0755  GLUCAP 212* 259* 166* 225* 111*     Critical care time: 35 minutes    Freda Jackson, MD [facility] Office: [phone]   See Amion for personal pager PCCM on call pager [phone] until 7pm. Please call Elink 7p-7a. [phone]

## 2022-06-22 NOTE — Progress Notes (Signed)
Patient on Bipap during [facility] check

## 2022-06-22 NOTE — Progress Notes (Signed)
Assisted MD with Intubation.  Pt tolerated well.  CXR ordered  SPO2 98%

## 2022-06-22 NOTE — Progress Notes (Signed)
Patient with diffuse twitching of face, upper and lower extremities after intubation. Blood pressure is elevated 200/140s.  Starting propofol and giving 2mg  versed. EEG ordered. Once sedation is adequately increased, may add PRN anti-hypertensives.  Freda Jackson, MD [facility] Office: [phone]   See Amion for personal pager PCCM on call pager [phone] until 7pm. Please call Elink 7p-7a. [phone]

## 2022-06-22 NOTE — Progress Notes (Addendum)
RT pulled back EET 2 cm from 27@lip  to 25@the  lip per CCM order. Patient tolerated fairley well

## 2022-06-22 NOTE — Progress Notes (Addendum)
Pharmacy Antibiotic Note  Jorge Adams is a 71 y.o. male admitted on 05/24/2022 with pneumonitis and suspected UTI.  Patient initiated on cefepime 11/28.  Today, urine culture sensitivity resulted as ESBL E.Coli.  Provider has stopped cefepime and consulted Pharmacy for meropenem dosing.  Plan: Meropenem 1 g IV every 12 hours Monitor clinical progress, renal function, and LOT   Height: 5\' 10"  (177.8 cm) Weight: 81 kg (178 lb 9.2 oz) IBW/kg (Calculated) : 73  Temp (24hrs), Avg:97.7 F (36.5 C), Min:97.4 F (36.3 C), Max:98.1 F (36.7 C)  Recent Labs  Lab 06/16/2022 1705 06/19/22 0515 06/19/22 0825 06/20/22 0422 06/20/22 0940 06/21/22 0439 06/22/22 0500 06/22/22 1056  WBC 4.5 5.4 5.5 5.1 6.1 7.9 5.9  --   CREATININE 2.42* 2.27* 2.25* 2.55*  --  2.72*  --  2.72*  LATICACIDVEN 0.9  --   --   --   --   --   --   --     Estimated Creatinine Clearance: 25 mL/min (A) (by C-G formula based on SCr of 2.72 mg/dL (H)).    Allergies  Allergen Reactions   Metformin And Related     Held due to creatinine elevation September 2023.   Budesonide Rash    Drug rash with oral Tarpeyo     Antimicrobials this admission: 11/28 cefepime >> 12/2 12/2 meropenem >>   Microbiology results: 11/28 BCx: ngtd 11/28 UCx: ESBL E. Coli  11/29 MRSA PCR: not detected  Thank you for allowing pharmacy to be a part of this patient's care.  Suzzanne Cloud, PharmD, BCPS 06/22/2022 1:32 PM

## 2022-06-23 DIAGNOSIS — J9601 Acute respiratory failure with hypoxia: Secondary | ICD-10-CM | POA: Diagnosis not present

## 2022-06-23 LAB — CBC
HCT: 28.2 % — ABNORMAL LOW (ref 39.0–52.0)
Hemoglobin: 9 g/dL — ABNORMAL LOW (ref 13.0–17.0)
MCH: 29.1 pg (ref 26.0–34.0)
MCHC: 31.9 g/dL (ref 30.0–36.0)
MCV: 91.3 fL (ref 80.0–100.0)
Platelets: 231 10*3/uL (ref 150–400)
RBC: 3.09 MIL/uL — ABNORMAL LOW (ref 4.22–5.81)
RDW: 16.8 % — ABNORMAL HIGH (ref 11.5–15.5)
WBC: 8.6 10*3/uL (ref 4.0–10.5)
nRBC: 0 % (ref 0.0–0.2)

## 2022-06-23 LAB — GLUCOSE, CAPILLARY
Glucose-Capillary: 133 mg/dL — ABNORMAL HIGH (ref 70–99)
Glucose-Capillary: 93 mg/dL (ref 70–99)
Glucose-Capillary: 124 mg/dL — ABNORMAL HIGH (ref 70–99)
Glucose-Capillary: 161 mg/dL — ABNORMAL HIGH (ref 70–99)
Glucose-Capillary: 145 mg/dL — ABNORMAL HIGH (ref 70–99)
Glucose-Capillary: 122 mg/dL — ABNORMAL HIGH (ref 70–99)
Glucose-Capillary: 108 mg/dL — ABNORMAL HIGH (ref 70–99)

## 2022-06-23 LAB — BLOOD GAS, ARTERIAL
Acid-base deficit: 5.8 mmol/L — ABNORMAL HIGH (ref 0.0–2.0)
Bicarbonate: 19.5 mmol/L — ABNORMAL LOW (ref 20.0–28.0)
O2 Saturation: 99.9 %
Patient temperature: 37
pCO2 arterial: 37 mmHg (ref 32–48)
pH, Arterial: 7.33 — ABNORMAL LOW (ref 7.35–7.45)
pO2, Arterial: 101 mmHg (ref 83–108)

## 2022-06-23 LAB — BASIC METABOLIC PANEL
Anion gap: 10 (ref 5–15)
BUN: 73 mg/dL — ABNORMAL HIGH (ref 8–23)
CO2: 20 mmol/L — ABNORMAL LOW (ref 22–32)
Calcium: 8 mg/dL — ABNORMAL LOW (ref 8.9–10.3)
Chloride: 108 mmol/L (ref 98–111)
Creatinine, Ser: 3.02 mg/dL — ABNORMAL HIGH (ref 0.61–1.24)
GFR, Estimated: 21 mL/min — ABNORMAL LOW (ref >60)
Glucose, Bld: 189 mg/dL — ABNORMAL HIGH (ref 70–99)
Potassium: 4.9 mmol/L (ref 3.5–5.1)
Sodium: 138 mmol/L (ref 135–145)

## 2022-06-23 LAB — MAGNESIUM: Magnesium: 1.5 mg/dL — ABNORMAL LOW (ref 1.7–2.4)

## 2022-06-23 LAB — CULTURE, BLOOD (ROUTINE X 2)
Culture: NO GROWTH
Special Requests: ADEQUATE

## 2022-06-23 LAB — HEPARIN LEVEL (UNFRACTIONATED): Heparin Unfractionated: 0.52 IU/mL (ref 0.30–0.70)

## 2022-06-23 LAB — TRIGLYCERIDES: Triglycerides: 77 mg/dL (ref <150)

## 2022-06-23 MED ORDER — ASPIRIN 81 MG PO CHEW
81.0000 mg | CHEWABLE_TABLET | Freq: Every day | ORAL | Status: DC
  Administered 2022-06-23 – 2022-06-26 (×4): 81 mg
  Filled 2022-06-23 (×4): qty 1

## 2022-06-23 MED ORDER — LEVOTHYROXINE SODIUM 50 MCG PO TABS
50.0000 ug | ORAL_TABLET | Freq: Every day | ORAL | Status: DC
  Administered 2022-06-24 – 2022-06-26 (×3): 50 ug
  Filled 2022-06-23 (×3): qty 1

## 2022-06-23 MED ORDER — MAGNESIUM SULFATE 2 GM/50ML IV SOLN
2.0000 g | Freq: Once | INTRAVENOUS | Status: AC
  Administered 2022-06-23: 2 g via INTRAVENOUS
  Filled 2022-06-23: qty 50

## 2022-06-23 MED ORDER — PANTOPRAZOLE SODIUM 40 MG IV SOLR
40.0000 mg | Freq: Every day | INTRAVENOUS | Status: DC
  Administered 2022-06-23 – 2022-06-26 (×4): 40 mg via INTRAVENOUS
  Filled 2022-06-23 (×4): qty 10

## 2022-06-23 NOTE — Progress Notes (Signed)
ANTICOAGULATION CONSULT NOTE - Follow Up Consult  Pharmacy Consult for Heparin Indication: chest pain/ACS, DVT/PE   Allergies  Allergen Reactions   Metformin And Related     Held due to creatinine elevation September 2023.   Budesonide Rash    Drug rash with oral Tarpeyo     Patient Measurements: Height: 5\' 10"  (177.8 cm) Weight: 81.5 kg (179 lb 10.8 oz) IBW/kg (Calculated) : 73 Heparin Dosing Weight:  80.7 kg  Vital Signs: Temp: 98.9 F (37.2 C) (12/03 0405) Temp Source: Oral (12/03 0405) BP: 124/64 (12/03 0400) Pulse Rate: 87 (12/03 0400)  Labs: Recent Labs    06/21/22 0439 06/22/22 0500 06/22/22 1056 06/23/22 0400  HGB 9.1* 8.4*  --  9.0*  HCT 27.6* 26.1*  --  28.2*  PLT 188 177  --  231  HEPARINUNFRC 0.53 0.41  --  0.52  CREATININE 2.72*  --  2.72*  --      Estimated Creatinine Clearance: 25 mL/min (A) (by C-G formula based on SCr of 2.72 mg/dL (H)).   Assessment: AC/Heme: IV heparin for ACS + DVT and presumed PE LE doppler: Findings consistent with acute deep vein thrombosis involving the right peroneal veins and left popliteal vein.   06/23/2022 HL 0.52 therapeutic on 1650 units/hr Hgb 9 plts 231 No bleeding noted per RN   Goal of Therapy:  Heparin level 0.3-0.7 units/ml Monitor platelets by anticoagulation protocol: Yes   Plan:  Continue heparin drip at 1650 units/hr. Monitor daily heparin level, CBC, signs/symptoms of bleeding    Thank you for allowing pharmacy to be a part of this patient's care.  Dolly Rias RPh 06/23/2022, 4:35 AM

## 2022-06-23 NOTE — Progress Notes (Signed)
PT Cancellation Note  Patient Details Name: Jorge Adams MRN: [id number] DOB: [birth year]   Cancelled Treatment:     PT order received but eval not completed 2* pt decline in status over last several days.  Pt now on vent.  PT service will sign off, please reorder as pt condition warrants.   Tikia Skilton 06/23/2022, 7:01 AM

## 2022-06-23 NOTE — Progress Notes (Signed)
NAME:  BRAULIO KIEDROWSKI, MRN:  [id number], DOB:  09/19/1950, LOS: 5 ADMISSION DATE:  06/04/2022, CONSULTATION DATE:  06/19/22 REFERRING MD:  Alfredia Ferguson CHIEF COMPLAINT:  respiratory failure   History of Present Illness:  Isaak Delmundo is a 71 year old male with Stage IIIb squamous cell carcinoma non-small cell lung cancer with large right lower lobe mass with satellite nodules and mediastinal adenopathy diagnosed in May 2023. He completed 6 cycles caboplatin/paclitaxel, last dose 02/04/22. He was delayed in starting consolidative immunotherapy due to deconditioning and radiation induced esophagitis. He was treated with steroid taper in 02/2022 for a couple of weeks. He then started durvalumab on 9/7. He was admitted to the hospital 9/20 to 9/27 for acute kidney injury and nephrotic syndrome. He had renal biopsy on 9/26 with findings of ATN and Focal Proliferative Glomerulonephritis with IgA dominant immune-complex deposition. He has been following with renal and patient's wife reports he was started on steroid treatment for the renal disease. He had second durvalumab infusion on 04/24/22.  He presented 11/28 with 3 months of cough and 3 weeks progressive dyspnea, orthopnea. +wheezing and subjective fevers. On no O2 at home. Found to have acute hypoxemic respiratory failure due to multifocal pneumonitis and presumed PE with +LE duplex.   He has been started on heparin drip for DVT and presumed PE. Echo 11/29 does not indicate right heart strain.   Pertinent  Medical History  3b NSCLC: last radiation 02/08/2022.  S/P carboplatin, paclitaxel x 6 cycles also finished in July.  He is now s/p 2 cycles of consolidation immunotherapy with imfinzi  last dose seems to have been beginning of this month.   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 rate (!) 26, height 5\' 10"  (1.778 m), weight 81.5 kg, SpO2 95 %.    Vent Mode: PRVC FiO2 (%):  [50 %-100 %] 50 % Set Rate:  [26 bmp] 26 bmp Vt Set:  [580 mL] 580 mL PEEP:  [8 cmH20] 8 cmH20 Plateau Pressure:  [24 cmH20-30 cmH20] 30 cmH20   Intake/Output Summary (Last 24 hours) at 06/23/2022 0747 Last data filed at 06/23/2022 0700 Gross per 24 hour  Intake 1448.44 ml  Output 550 ml  Net 898.44 ml   Filed Weights   06/22/22 0645 06/22/22 1155 06/23/22 0405  Weight: 81 kg 81 kg 81.5 kg    Examination: General: elderly male, intubated sedated HENT: Sclera anicteric, moist mucous membranes Lungs: bronchial breath sounds on right, no wheezing Cardiovascular: rrr, no murmurs Abdomen: soft, non-tender, non-distended, BS+ Extremities: warm, no edema Neuro: sedated, moving all extremities GU: n/a  Resolved Hospital Problem list     Assessment & Plan:  Acute Hypoxemic Respiratory Failure Drug induced Pneumonitis Suspected Pulmonary Emboli Stage IIIB non-small cell lung cancer - Continue mechanical ventilation - Continue meropenem and atovaquone for empiric pneumocystis treatment. LDH elevated. Follow up fungitell. Follow up pneumocystis PCR from 12/2 tracheal aspirate - continue solumedrol  for concern of pneumonitis  - Hold diuresis with hypotension and elevated Cr  Hypotension from sedation - levophed for MAP 65 or greater  Acute Bilateral DVT - continue heparin drip per pharmacy  Focal Proliferative Glomerulonephritis with IgA dominant immune-complex  deposition CKD IIIb-IV Metabolic Acidosis - Nephrology consulted - Cr elevated with episodes of hypotension since intubation - Stop diuresis at this time - Non-oliguric at this time - monitor electrolytes and serumb bicarb  Acute on Chronic Diastolic Heart Failure Hypertension Coronary Artery Disease PAD Type II NSTEMI - hold bidil, metoprolol due to hypotension  - Continue aspirin 81 mg daily, cilostazol 100mg  daily, and rosuvastatin 20mg  daily  Diabetes Mellitus II with vascular disease - SSI  Nutrition - tube feeds  Urinary Retention - place foley  Best Practice (right click and "Reselect all SmartList Selections" daily)   Diet/type: tubefeeds DVT prophylaxis: systemic heparin GI prophylaxis: N/A Lines: N/A Foley:  Yes, and it is still needed Code Status:  full code Last date of multidisciplinary goals of care discussion [12/1, plan to hold off on intubation as long as possible]  Labs   CBC: Recent Labs  Lab 06/17/2022 1705 06/19/22 0515 06/20/22 0422 06/20/22 0940 06/21/22 0439 06/22/22 0500 06/23/22 0400  WBC 4.5   < > 5.1 6.1 7.9 5.9 8.6  NEUTROABS 3.7  --  4.6 5.3 7.1  --   --   HGB 8.6*   < > 7.0* 7.4* 9.1* 8.4* 9.0*  HCT 25.9*   < > 21.7* 22.8* 27.6* 26.1* 28.2*  MCV 89.6   < > 91.2 91.2 88.7 90.3 91.3  PLT 190   < > 176 169 188 177 231   < > = values in this interval not displayed.    Basic Metabolic Panel: Recent Labs  Lab 06/19/2022 1705 05/24/2022 2116 06/19/22 0515 06/19/22 0825 06/20/22 0422 06/21/22 0439 06/22/22 1056 06/23/22 0400  NA 132*  --   --  137 134* 138 139 138  K 3.1*  --   --  3.3* 4.0 4.8 4.0 4.9  CL 100  --   --  104 105 109 109 108  CO2 23  --   --  21* 21* 20* 21* 20*  GLUCOSE 201*  --   --  101* 249* 107* 127* 189*  BUN 57*  --   --  51* 60* 65* 63* 73*  CREATININE 2.42*  --  2.27* 2.25* 2.55* 2.72* 2.72* 3.02*  CALCIUM 7.5*  --   --  7.6* 7.3* 8.0* 8.0* 8.0*  MG 1.5* 1.3* 1.9  --  1.7 1.7  --  1.5*  PHOS 4.3  --   3.9  --  4.1 4.1  --   --    GFR: Estimated Creatinine Clearance: 22.5 mL/min (A) (by C-G formula based on SCr of 3.02 mg/dL (H)). Recent Labs  Lab 06/07/2022 1705 06/19/22 0515 06/19/22 0825 06/20/22 0422 06/20/22 0940 06/21/22 0439 06/22/22 0500 06/23/22 0400  PROCALCITON 0.73 0.53  --  0.79  --   --   --   --   WBC 4.5 5.4   < > 5.1 6.1 7.9 5.9 8.6  LATICACIDVEN 0.9  --   --   --   --   --   --   --    < > = values in this interval not displayed.    Liver Function Tests: Recent Labs  Lab 06/06/2022 1705 06/19/22 0825 06/20/22 0422 06/21/22 0439 06/22/22 1056  AST 43* 54* 40 45*  38  ALT 21 20 18 18 18   ALKPHOS 52 51 50 77 108  BILITOT 0.8 0.7 0.5 0.6 0.6  PROT 5.5* 5.2* 4.6* 5.4* 5.2*  ALBUMIN 2.0* 1.7* 1.6* 1.8* 1.7*   No results for input(s): "LIPASE", "AMYLASE" in the last 168 hours. No results for input(s): "AMMONIA" in the last 168 hours.  ABG    Component Value Date/Time   PHART 7.3 (L) 06/22/2022 1820   PCO2ART 45 06/22/2022 1820   PO2ART 225 (H) 06/22/2022 1820   HCO3 22.1 06/22/2022 1820   TCO2 29 01/05/2016 1654   ACIDBASEDEF 4.2 (H) 06/22/2022 1820   O2SAT 100 06/22/2022 1820     Coagulation Profile: Recent Labs  Lab 06/09/2022 1705  INR 1.2    Cardiac Enzymes: Recent Labs  Lab 06/17/2022 1705  CKTOTAL 63    HbA1C: Hgb A1c MFr Bld  Date/Time Value Ref Range Status  02/22/2022 08:10 AM 6.6 (H) 4.8 - 5.6 % Final    Comment:    (NOTE) Pre diabetes:          5.7%-6.4%  Diabetes:              >6.4%  Glycemic control for   <7.0% adults with diabetes   10/29/2021 10:16 AM 6.4 4.6 - 6.5 % Final    Comment:    Glycemic Control Guidelines for People with Diabetes:Non Diabetic:  <6%Goal of Therapy: <7%Additional Action Suggested:  >8%     CBG: Recent Labs  Lab 06/22/22 1555 06/22/22 1601 06/22/22 1942 06/22/22 2340 06/23/22 0332  GLUCAP 69* 155* 170* 129* 133*     Critical care time: 35 minutes    Freda Jackson, MD [place]  [facility] Office: [phone]   See Amion for personal pager PCCM on call pager [phone] until 7pm. Please call Elink 7p-7a. [phone]

## 2022-06-23 NOTE — Progress Notes (Signed)
OT Cancellation Note  Patient Details Name: Jorge Adams MRN: [id number] DOB: 1950-11-28   Cancelled Treatment:    Reason Eval/Treat Not Completed: Patient not medically ready. Patient has been intubated and seated. Will sign off for now. Reorder when patient is medically ready for therapy.  Amador Braddy L Sophi Calligan 06/23/2022, 7:10 AM

## 2022-06-24 ENCOUNTER — Inpatient Hospital Stay (HOSPITAL_COMMUNITY)
Admit: 2022-06-24 | Discharge: 2022-06-24 | Disposition: A | Discharge status: Home / Self Care | Payer: Medicare HMO | Attending: Student | Admitting: Student

## 2022-06-24 ENCOUNTER — Inpatient Hospital Stay (HOSPITAL_COMMUNITY): Payer: Medicare HMO

## 2022-06-24 ENCOUNTER — Inpatient Hospital Stay: Payer: Medicare HMO

## 2022-06-24 ENCOUNTER — Inpatient Hospital Stay: Payer: Medicare HMO | Admitting: Internal Medicine

## 2022-06-24 DIAGNOSIS — J80 Acute respiratory distress syndrome: Secondary | ICD-10-CM

## 2022-06-24 LAB — CBC
HCT: 29.9 % — ABNORMAL LOW (ref 39.0–52.0)
Hemoglobin: 9.6 g/dL — ABNORMAL LOW (ref 13.0–17.0)
MCH: 28.8 pg (ref 26.0–34.0)
MCHC: 32.1 g/dL (ref 30.0–36.0)
MCV: 89.8 fL (ref 80.0–100.0)
Platelets: 168 10*3/uL (ref 150–400)
RBC: 3.33 MIL/uL — ABNORMAL LOW (ref 4.22–5.81)
RDW: 16.7 % — ABNORMAL HIGH (ref 11.5–15.5)
WBC: 5.5 10*3/uL (ref 4.0–10.5)
nRBC: 0 % (ref 0.0–0.2)

## 2022-06-24 LAB — BASIC METABOLIC PANEL
Anion gap: 12 (ref 5–15)
BUN: 84 mg/dL — ABNORMAL HIGH (ref 8–23)
CO2: 18 mmol/L — ABNORMAL LOW (ref 22–32)
Calcium: 8.2 mg/dL — ABNORMAL LOW (ref 8.9–10.3)
Chloride: 109 mmol/L (ref 98–111)
Creatinine, Ser: 3.46 mg/dL — ABNORMAL HIGH (ref 0.61–1.24)
GFR, Estimated: 18 mL/min — ABNORMAL LOW (ref >60)
Glucose, Bld: 97 mg/dL (ref 70–99)
Potassium: 5.8 mmol/L — ABNORMAL HIGH (ref 3.5–5.1)
Sodium: 139 mmol/L (ref 135–145)

## 2022-06-24 LAB — URINALYSIS, ROUTINE W REFLEX MICROSCOPIC
Bilirubin Urine: NEGATIVE
Glucose, UA: NEGATIVE mg/dL
Ketones, ur: NEGATIVE mg/dL
Nitrite: NEGATIVE
Protein, ur: 100 mg/dL — AB
RBC / HPF: >50 RBC/hpf — ABNORMAL HIGH (ref 0–5)
Specific Gravity, Urine: 1.015 (ref 1.005–1.030)
WBC, UA: >50 WBC/hpf — ABNORMAL HIGH (ref 0–5)
pH: 5 (ref 5.0–8.0)

## 2022-06-24 LAB — MAGNESIUM
Magnesium: 2 mg/dL (ref 1.7–2.4)
Magnesium: 2.1 mg/dL (ref 1.7–2.4)

## 2022-06-24 LAB — GLUCOSE, CAPILLARY
Glucose-Capillary: 102 mg/dL — ABNORMAL HIGH (ref 70–99)
Glucose-Capillary: 138 mg/dL — ABNORMAL HIGH (ref 70–99)
Glucose-Capillary: 175 mg/dL — ABNORMAL HIGH (ref 70–99)
Glucose-Capillary: 194 mg/dL — ABNORMAL HIGH (ref 70–99)
Glucose-Capillary: 219 mg/dL — ABNORMAL HIGH (ref 70–99)
Glucose-Capillary: 88 mg/dL (ref 70–99)
Glucose-Capillary: 89 mg/dL (ref 70–99)

## 2022-06-24 LAB — PHOSPHORUS
Phosphorus: 5.7 mg/dL — ABNORMAL HIGH (ref 2.5–4.6)
Phosphorus: 6.4 mg/dL — ABNORMAL HIGH (ref 2.5–4.6)

## 2022-06-24 LAB — POTASSIUM
Potassium: 5.1 mmol/L (ref 3.5–5.1)
Potassium: 5.4 mmol/L — ABNORMAL HIGH (ref 3.5–5.1)
Potassium: 5.7 mmol/L — ABNORMAL HIGH (ref 3.5–5.1)
Potassium: 5.6 mmol/L — ABNORMAL HIGH (ref 3.5–5.1)

## 2022-06-24 LAB — HEPARIN LEVEL (UNFRACTIONATED): Heparin Unfractionated: 0.54 IU/mL (ref 0.30–0.70)

## 2022-06-24 LAB — ANCA TITERS
Atypical P-ANCA titer: <1:20 {titer}
C-ANCA: <1:20 {titer}
P-ANCA: <1:20 {titer}

## 2022-06-24 LAB — GLOMERULAR BASEMENT MEMBRANE ANTIBODIES: GBM Ab: <0.2 units (ref 0.0–0.9)

## 2022-06-24 MED ORDER — SODIUM BICARBONATE 650 MG PO TABS: 650.0000 mg | ORAL_TABLET | Freq: Three times a day (TID) | ORAL | Status: DC

## 2022-06-24 MED ORDER — ORAL CARE MOUTH RINSE
15.0000 mL | OROMUCOSAL | Status: DC
  Administered 2022-06-24 – 2022-06-26 (×21): 15 mL via OROMUCOSAL

## 2022-06-24 MED ORDER — ALBUMIN HUMAN 5 % IV SOLN
25.0000 g | Freq: Once | INTRAVENOUS | Status: AC
  Administered 2022-06-24: 25 g via INTRAVENOUS
  Filled 2022-06-24: qty 500

## 2022-06-24 MED ORDER — INSULIN ASPART 100 UNIT/ML IV SOLN
5.0000 [IU] | Freq: Once | INTRAVENOUS | Status: AC
  Administered 2022-06-24: 5 [IU] via INTRAVENOUS

## 2022-06-24 MED ORDER — SODIUM BICARBONATE 8.4 % IV SOLN
INTRAVENOUS | Status: DC
  Filled 2022-06-24: qty 150
  Filled 2022-06-24 (×2): qty 1000
  Filled 2022-06-24 (×3): qty 150
  Filled 2022-06-24: qty 1000

## 2022-06-24 MED ORDER — SODIUM ZIRCONIUM CYCLOSILICATE 10 G PO PACK
10.0000 g | PACK | Freq: Once | ORAL | Status: AC
  Administered 2022-06-24: 10 g
  Filled 2022-06-24: qty 1

## 2022-06-24 MED ORDER — VITAL 1.5 CAL PO LIQD
1000.0000 mL | ORAL | Status: DC
  Administered 2022-06-24 – 2022-06-25 (×2): 1000 mL
  Filled 2022-06-24 (×3): qty 1000

## 2022-06-24 MED ORDER — THIAMINE MONONITRATE 100 MG PO TABS
100.0000 mg | ORAL_TABLET | Freq: Every day | ORAL | Status: DC
  Administered 2022-06-24 – 2022-06-25 (×2): 100 mg via ORAL
  Filled 2022-06-24 (×2): qty 1

## 2022-06-24 MED ORDER — PROSOURCE TF20 ENFIT COMPATIBL EN LIQD
60.0000 mL | Freq: Every day | ENTERAL | Status: DC
  Administered 2022-06-25 – 2022-06-26 (×2): 60 mL
  Filled 2022-06-24 (×2): qty 60

## 2022-06-24 MED ORDER — DEXTROSE 50 % IV SOLN
1.0000 | Freq: Once | INTRAVENOUS | Status: AC
  Administered 2022-06-24: 50 mL via INTRAVENOUS
  Filled 2022-06-24: qty 50

## 2022-06-24 MED ORDER — ORAL CARE MOUTH RINSE: 15.0000 mL | OROMUCOSAL | Status: DC | PRN

## 2022-06-24 NOTE — Progress Notes (Signed)
EEG complete - results pending 

## 2022-06-24 NOTE — Consult Note (Signed)
[facility]  INPATIENT CONSULTATION  Reason for Consultation: AKI Requesting Provider: Mr. Verlee Monte  HPI: Jorge Adams is an 71 y.o. male with CAD h/o CABG, DM, PAD, gout, h/o SCCa R lung (dx 11/2021; XRT 01/2022, 6 cycles carboplatin paclitaxel completed 01/2022, 2 cycles imfinzi), obesity currently admitted for hypoxic respiratory failure and nephrology is consulted for AKI on CKD.   Admitted 03/2022 for AKI, swelling, nephrotic range proteinuria.  Underwent renal biopsy which showed  membranoproliferative IgA.  Started tarpeyo early 05/2022 but developed drug rash shortly there after - added to allergies.  Started prednisone thereafter.  F/b Dr. Naomie Dean - no assoc of chemo or immunotherapy tx with IgA.    Presented 06/04/2022 with 3 months of cough and 3 weeks of DOE, orthopnea, fevers.  Hypoxic, ultimately requiring vent.  CXR with upper lobe opacities treated for HCAP.  Intubated 12/2.  Being treated for PJP (atovaquone) and drug induced pneumonitis (medrol)  as well.   ESBL E coli + urine from admission.  Diuresis held in light of Cr.  + bilateral DVTs - on heparin gtt.  BPs have been labile - some hypotension as low as 60-70s noted.    Cr through early 02/2022 normal then running in mid 2s - 2.42 at admission and has slowly trended up to 3.46 today.   Has been nonoliguric with UOP 2L > 566mL > 748mL.   Imaging 03/2022 Renal US R 12.8, L 14cm, normal.   UA: 11/28 - +blood, protein, > 50WBC and > 50RBC/hpf - culture E coli No contrast.  Foley inserted - appears was having some urinary retention; I'm unclear on details.  Being give volume challenge today with alb and IV bicarb after IVC resp variation noted.  Renal US pending today.   PMH: Past Medical History:  Diagnosis Date   Arthritis    ASHD (arteriosclerotic heart disease)    Atherosclerosis of native arteries of the extremities with intermittent claudication    Cataract    Coronary atherosclerosis of native coronary  artery    Diabetes mellitus    NIDDM   Dyslipidemia    ED (erectile dysfunction)    Full dentures    Glaucoma associated with ocular disorder, mild stage    bilateral   Hyperlipidemia    Hypertension    under control, has been on med. > 9 yr.   Impotence of organic origin    Meniscus tear 05/2012   right knee   Myocardial infarction [facility]) 10/2002   Stented coronary artery    PSH: Past Surgical History:  Procedure Laterality Date   ANTERIOR CERVICAL DECOMP/DISCECTOMY FUSION N/A 05/07/2018   Procedure: ANTERIOR CERVICAL DECOMPRESSION/DISCECTOMY FUSION CERVICAL FIVE - CERVICAL SIX;  Surgeon: Consuella Lose, MD;  Location: [place];  Service: Neurosurgery;  Laterality: N/A;  ANTERIOR CERVICAL DECOMPRESSION/DISCECTOMY FUSION CERVICAL FIVE - CERVICAL SIX   BRONCHIAL BIOPSY  12/04/2021   Procedure: BRONCHIAL BIOPSIES;  Surgeon: Collene Gobble, MD;  Location: [place];  Service: Cardiopulmonary;;   BRONCHIAL BRUSHINGS  12/04/2021   Procedure: BRONCHIAL BRUSHINGS;  Surgeon: Collene Gobble, MD;  Location: [place];  Service: Cardiopulmonary;;   BRONCHIAL NEEDLE ASPIRATION BIOPSY  12/04/2021   Procedure: BRONCHIAL NEEDLE ASPIRATION BIOPSIES;  Surgeon: Collene Gobble, MD;  Location: [place];  Service: Cardiopulmonary;;   CARDIAC CATHETERIZATION  10/23/2002   had 1 stent placed then-- by Dr. [place] Banker   CARDIAC CATHETERIZATION N/A 12/21/2015   Procedure: Left Heart Cath and Coronary Angiography;  Surgeon: Adrian Prows,  MD;  Location: [place] [facility];  Service: Cardiovascular;  Laterality: N/A;   CATARACT EXTRACTION Right 05/2017   CATARACT EXTRACTION Left 07/2017   COLONOSCOPY  10/08/2010   Dr.Patterson   CORONARY ARTERY BYPASS GRAFT N/A 01/02/2016   Procedure: CORONARY ARTERY BYPASS GRAFTING (CABG) TIMES 3 USING LEFT INTERNAL MAMMARY ARTERY AND RIGHT SAPHENOUS LEG VEIN HARVESTED ENDOSCOPICALLY;  Surgeon: Ivin Poot, MD;  Location: [place];  Service: Open Heart Surgery;   Laterality: N/A;   EYE SURGERY     INSERTION OF MESH N/A 05/06/2016   Procedure: INSERTION OF MESH;  Surgeon: Georganna Skeans, MD;  Location: [facility];  Service: General;  Laterality: N/A;   IR IMAGING GUIDED PORT INSERTION  04/12/2022   KNEE ARTHROSCOPY WITH MEDIAL MENISECTOMY  06/04/2012   Procedure: KNEE ARTHROSCOPY WITH MEDIAL MENISECTOMY;  Surgeon: Ninetta Lights, MD;  Location: [place];  Service: Orthopedics;  Laterality: Right;  RIGHT SCOPE MEDIAL MENISCECTOMY, CHONDROPLASTY, EXCISION LOOSE BODY   LOWER EXTREMITY ANGIOGRAM N/A 11/23/2013   Procedure: LOWER EXTREMITY ANGIOGRAM;  Surgeon: Laverda Page, MD;  Location: [facility] CATH LAB;  Service: Cardiovascular;  Laterality: N/A;   NECK SURGERY N/A    02/19/2018   PERIPHERAL VASCULAR CATHETERIZATION N/A 12/21/2015   Procedure: Abdominal Aortogram;  Surgeon: Adrian Prows, MD;  Location: [place] [facility];  Service: Cardiovascular;  Laterality: N/A;   PERIPHERAL VASCULAR CATHETERIZATION N/A 12/21/2015   Procedure: Abdominal Aortogram w/Lower Extremity;  Surgeon: Adrian Prows, MD;  Location: [place] [facility];  Service: Cardiovascular;  Laterality: N/A;   TEE WITHOUT CARDIOVERSION N/A 01/02/2016   Procedure: TRANSESOPHAGEAL ECHOCARDIOGRAM (TEE);  Surgeon: Ivin Poot, MD;  Location: [place];  Service: Open Heart Surgery;  Laterality: N/A;   UMBILICAL HERNIA REPAIR N/A 05/06/2016   Procedure: HERNIA REPAIR UMBILICAL ADULT;  Surgeon: Georganna Skeans, MD;  Location: [place];  Service: General;  Laterality: N/A;   VIDEO BRONCHOSCOPY N/A 12/04/2021   Procedure: VIDEO BRONCHOSCOPY WITH FLUORO;  Surgeon: Collene Gobble, MD;  Location: [place];  Service: Cardiopulmonary;  Laterality: N/A;    Past Medical History:  Diagnosis Date   Arthritis    ASHD (arteriosclerotic heart disease)    Atherosclerosis of native arteries of the extremities with intermittent claudication    Cataract    Coronary atherosclerosis of native coronary artery     Diabetes mellitus    NIDDM   Dyslipidemia    ED (erectile dysfunction)    Full dentures    Glaucoma associated with ocular disorder, mild stage    bilateral   Hyperlipidemia    Hypertension    under control, has been on med. > 9 yr.   Impotence of organic origin    Meniscus tear 05/2012   right knee   Myocardial infarction [facility]) 10/2002   Stented coronary artery     Medications:    Medications Prior to Admission  Medication Sig Dispense Refill   albuterol (VENTOLIN HFA) 108 (90 Base) MCG/ACT inhaler Inhale 1-2 puffs into the lungs every 6 (six) hours as needed (for cough.  okay to fill with albuterol/proair/ventolin.). (Patient taking differently: Inhale 1-2 puffs into the lungs every 6 (six) hours as needed for shortness of breath.) 8 g 2   aspirin EC 81 MG tablet Take 1 tablet (81 mg total) by mouth daily. Okay to restart this medication on 12/05/2021. 30 tablet 11   brimonidine (ALPHAGAN P) 0.1 % SOLN Place 2 drops into both eyes 2 (two) times daily.  cilostazol (PLETAL) 100 MG tablet TAKE 1 TABLET BY MOUTH TWICE A DAY 180 tablet 1   Cyanocobalamin (VITAMIN B12 PO) Take 1 tablet by mouth in the morning and at bedtime.     dorzolamide-timolol (COSOPT) 22.3-6.8 MG/ML ophthalmic solution Place 1 drop into both eyes 2 (two) times daily.     doxycycline (VIBRA-TABS) 100 MG tablet Take 1 tablet (100 mg total) by mouth 2 (two) times daily. 20 tablet 0   FeFum-FePoly-FA-B Cmp-C-Biot (FOLIVANE-PLUS) CAPS TAKE 1 CAPSULE BY MOUTH EVERY DAY IN THE MORNING (Patient taking differently: Take 1 capsule by mouth daily.) 90 capsule 0   fenofibrate (TRICOR) 145 MG tablet TAKE 1 TABLET BY MOUTH EVERY DAY IN THE EVENING (Patient taking differently: Take 145 mg by mouth daily.) 90 tablet 1   HYDROcodone bit-homatropine (HYCODAN) 5-1.5 MG/5ML syrup Take 5 mLs by mouth every 8 (eight) hours as needed for cough (sedation caution.). 120 mL 0   ipratropium-albuterol (DUONEB) 0.5-2.5 (3) MG/3ML SOLN Take  3 mLs by nebulization every 6 (six) hours as needed. (Patient taking differently: Take 3 mLs by nebulization every 6 (six) hours as needed (for shortness of breath).) 360 mL 1   levothyroxine (SYNTHROID) 50 MCG tablet TAKE 1 TABLET BY MOUTH EVERY DAY BEFORE BREAKFAST (Patient taking differently: Take 50 mcg by mouth daily before breakfast.) 90 tablet 1   Magnesium Oxide 250 MG TABS Take 1 tablet (250 mg total) by mouth daily. 90 tablet 1   metoprolol tartrate (LOPRESSOR) 25 MG tablet Take 25 mg by mouth 2 (two) times daily.     mirtazapine (REMERON) 7.5 MG tablet TAKE 1 TABLET BY MOUTH AT BEDTIME. 90 tablet 1   Netarsudil-Latanoprost (ROCKLATAN) 0.02-0.005 % SOLN Place 1 drop into both eyes at bedtime.     nitroGLYCERIN (NITROSTAT) 0.4 MG SL tablet Place 1 tablet (0.4 mg total) under the tongue every 5 (five) minutes as needed for chest pain. 25 tablet 5   omega-3 acid ethyl esters (LOVAZA) 1 g capsule TAKE 1 CAPSULE BY MOUTH TWICE A DAY (Patient taking differently: Take 1 g by mouth 2 (two) times daily.) 180 capsule 1   pantoprazole (PROTONIX) 40 MG tablet Take 40 mg by mouth daily.     potassium chloride 20 MEQ/15ML (10%) SOLN TAKE 30 MLS (40 MEQ TOTAL) BY MOUTH 2 (TWO) TIMES DAILY.  1   predniSONE (DELTASONE) 20 MG tablet Take 2 a day for 5 days, then 1 a day for 5 days, with food. Don't take with aleve/ibuprofen. (Patient taking differently: Take 40 mg by mouth daily with breakfast.) 15 tablet 0   prochlorperazine (COMPAZINE) 10 MG tablet TAKE 1 TABLET BY MOUTH EVERY 6 HOURS AS NEEDED FOR NAUSEA OR VOMITING. (Patient taking differently: Take 10 mg by mouth every 6 (six) hours as needed for nausea or vomiting.) 30 tablet 2   rosuvastatin (CRESTOR) 20 MG tablet TAKE 1 TABLET BY MOUTH EVERY DAY (Patient taking differently: Take 20 mg by mouth daily.) 30 tablet 0   torsemide (DEMADEX) 20 MG tablet Take 2 tablets (40 mg total) by mouth 2 (two) times daily. (Patient taking differently: Take 40 mg by mouth  daily as needed (Fluid).)     triamcinolone cream (KENALOG) 0.1 % Apply 1 Application topically 2 (two) times daily. (Patient taking differently: Apply 1 Application topically daily as needed (For rash).) 60 g 3    ALLERGIES:   Allergies  Allergen Reactions   Metformin And Related     Held due to creatinine elevation September  2023.   Budesonide Rash    Drug rash with oral Tarpeyo     FAM HX: Family History  Problem Relation Age of Onset   Bone cancer Mother    Pneumonia Father    Diabetes Brother    Heart disease Brother        Pacemaker   COPD Brother    Colon cancer Neg Hx    Prostate cancer Neg Hx    Esophageal cancer Neg Hx    Stomach cancer Neg Hx     Social History:   reports that he quit smoking about 19 years ago. His smoking use included cigarettes. He has a 87.50 pack-year smoking history. He has never used smokeless tobacco. He reports that he does not currently use alcohol. He reports that he does not use drugs.  ROS: unable to obtain given patient status  Blood pressure (!) 72/44, pulse (!) 127, temperature 99.3 F (37.4 C), temperature source Axillary, resp. rate (!) 26, height 5\' 10"  (1.778 m), weight 79.2 kg, SpO2 100 %. PHYSICAL EXAM: Gen: intubated, sedated  Eyes: anicteric ENT:ETT in place Neck: supple CV: tachycardic, no rub Abd: soft Lungs: coarse ant GU: foley with bloody urine without clots noted  - tea colored/bloody Extr: no edema Neuro:sedated Skin: no rashes noted, R chest wall port accessed c/d/i   Results for orders placed or performed during the hospital encounter of 05/24/2022 (from the past 48 hour(s))  Glucose, capillary     Status: Abnormal   Collection Time: 06/22/22 11:58 AM  Result Value Ref Range   Glucose-Capillary 137 (H) 70 - 99 mg/dL    Comment: Glucose reference range applies only to samples taken after fasting for at least 8 hours.   Comment 1 Notify RN    Comment 2 Document in Chart   Glucose, capillary     Status:  Abnormal   Collection Time: 06/22/22  3:55 PM  Result Value Ref Range   Glucose-Capillary 69 (L) 70 - 99 mg/dL    Comment: Glucose reference range applies only to samples taken after fasting for at least 8 hours.  Glucose, capillary     Status: Abnormal   Collection Time: 06/22/22  4:01 PM  Result Value Ref Range   Glucose-Capillary 155 (H) 70 - 99 mg/dL    Comment: Glucose reference range applies only to samples taken after fasting for at least 8 hours.  Culture, Respiratory w Gram Stain     Status: None (Preliminary result)   Collection Time: 06/22/22  5:59 PM   Specimen: Tracheal Aspirate; Respiratory  Result Value Ref Range   Specimen Description      TRACHEAL ASPIRATE Performed at [facility] [address] [place] [address], [place], [place] [address]    Special Requests      NONE Performed at [facility], [place] [address] [place], [place] [address]    Gram Stain      RARE WBC PRESENT, PREDOMINANTLY PMN NO ORGANISMS SEEN    Culture      NO GROWTH 2 DAYS Performed at [facility] [address], [place], [place] [address]    Report Status PENDING   Blood gas, arterial     Status: Abnormal   Collection Time: 06/22/22  6:20 PM  Result Value Ref Range   pH, Arterial 7.3 (L) 7.35 - 7.45   pCO2 arterial 45 32 - 48 mmHg   pO2, Arterial 225 (H) 83 - 108 mmHg   Bicarbonate 22.1 20.0 -  28.0 mmol/L   Acid-base deficit 4.2 (H) 0.0 - 2.0 mmol/L   O2 Saturation 100 %   Patient temperature 37.0    Allens test (pass/fail) PASS PASS    Comment: Performed at [facility], [place] [address] [place], [place] [address]  Glucose, capillary     Status: Abnormal   Collection Time: 06/22/22  7:42 PM  Result Value Ref Range   Glucose-Capillary 170 (H) 70 - 99 mg/dL    Comment: Glucose reference range applies only to samples taken after fasting for at least 8 hours.   Comment 1 Notify RN    Comment 2 Document in Chart   Glucose, capillary      Status: Abnormal   Collection Time: 06/22/22 11:40 PM  Result Value Ref Range   Glucose-Capillary 129 (H) 70 - 99 mg/dL    Comment: Glucose reference range applies only to samples taken after fasting for at least 8 hours.  Glucose, capillary     Status: Abnormal   Collection Time: 06/23/22  3:32 AM  Result Value Ref Range   Glucose-Capillary 133 (H) 70 - 99 mg/dL    Comment: Glucose reference range applies only to samples taken after fasting for at least 8 hours.   Comment 1 Notify RN    Comment 2 Document in Chart   CBC     Status: Abnormal   Collection Time: 06/23/22  4:00 AM  Result Value Ref Range   WBC 8.6 4.0 - 10.5 K/uL   RBC 3.09 (L) 4.22 - 5.81 MIL/uL   Hemoglobin 9.0 (L) 13.0 - 17.0 g/dL   HCT 28.2 (L) 39.0 - 52.0 %   MCV 91.3 80.0 - 100.0 fL   MCH 29.1 26.0 - 34.0 pg   MCHC 31.9 30.0 - 36.0 g/dL   RDW 16.8 (H) 11.5 - 15.5 %   Platelets 231 150 - 400 K/uL   nRBC 0.0 0.0 - 0.2 %    Comment: Performed at [facility], [place] [address] [place], [place] [address]  Heparin level (unfractionated)     Status: None   Collection Time: 06/23/22  4:00 AM  Result Value Ref Range   Heparin Unfractionated 0.52 0.30 - 0.70 IU/mL    Comment: (NOTE) The clinical reportable range upper limit is being lowered to >1.10 to align with the FDA approved guidance for the current laboratory assay.  If heparin results are below expected values, and patient dosage has  been confirmed, suggest follow up testing of antithrombin III levels. Performed at [facility], [place] [address] [place], [place] [address]   Triglycerides     Status: None   Collection Time: 06/23/22  4:00 AM  Result Value Ref Range   Triglycerides 77 <150 mg/dL    Comment: Performed at [facility], [place] [address] [place], [place] [address]  Basic metabolic panel     Status: Abnormal   Collection Time: 06/23/22  4:00 AM  Result Value Ref Range   Sodium 138 135 -  145 mmol/L   Potassium 4.9 3.5 - 5.1 mmol/L   Chloride 108 98 - 111 mmol/L   CO2 20 (L) 22 - 32 mmol/L   Glucose, Bld 189 (H) 70 - 99 mg/dL    Comment: Glucose reference range applies only to samples taken after fasting for at least 8 hours.   BUN 73 (H) 8 - 23 mg/dL   Creatinine, Ser 3.02 (H) 0.61 - 1.24 mg/dL   Calcium 8.0 (  L) 8.9 - 10.3 mg/dL   GFR, Estimated 21 (L) >60 mL/min    Comment: (NOTE) Calculated using the CKD-EPI Creatinine Equation (2021)    Anion gap 10 5 - 15    Comment: Performed at [facility], [place] [address], [place], [place] [address]  Magnesium     Status: Abnormal   Collection Time: 06/23/22  4:00 AM  Result Value Ref Range   Magnesium 1.5 (L) 1.7 - 2.4 mg/dL    Comment: Performed at [facility], [place] [address], [place], [place] [address]  Blood gas, arterial     Status: Abnormal   Collection Time: 06/23/22  7:55 AM  Result Value Ref Range   pH, Arterial 7.33 (L) 7.35 - 7.45   pCO2 arterial 37 32 - 48 mmHg   pO2, Arterial 101 83 - 108 mmHg   Bicarbonate 19.5 (L) 20.0 - 28.0 mmol/L   Acid-base deficit 5.8 (H) 0.0 - 2.0 mmol/L   O2 Saturation 99.9 %   Patient temperature 37.0    Allens test (pass/fail) PASS PASS    Comment: Performed at [facility], [place] [address] [place], [place] [address]  Glucose, capillary     Status: None   Collection Time: 06/23/22  8:23 AM  Result Value Ref Range   Glucose-Capillary 93 70 - 99 mg/dL    Comment: Glucose reference range applies only to samples taken after fasting for at least 8 hours.   Comment 1 Notify RN    Comment 2 Document in Chart   Glucose, capillary     Status: Abnormal   Collection Time: 06/23/22 11:42 AM  Result Value Ref Range   Glucose-Capillary 124 (H) 70 - 99 mg/dL    Comment: Glucose reference range applies only to samples taken after fasting for at least 8 hours.  Glucose, capillary     Status: Abnormal   Collection Time: 06/23/22  4:31  PM  Result Value Ref Range   Glucose-Capillary 161 (H) 70 - 99 mg/dL    Comment: Glucose reference range applies only to samples taken after fasting for at least 8 hours.  Glucose, capillary     Status: Abnormal   Collection Time: 06/23/22  7:42 PM  Result Value Ref Range   Glucose-Capillary 145 (H) 70 - 99 mg/dL    Comment: Glucose reference range applies only to samples taken after fasting for at least 8 hours.   Comment 1 Notify RN    Comment 2 Document in Chart   Glucose, capillary     Status: Abnormal   Collection Time: 06/23/22  9:49 PM  Result Value Ref Range   Glucose-Capillary 122 (H) 70 - 99 mg/dL    Comment: Glucose reference range applies only to samples taken after fasting for at least 8 hours.   Comment 1 Notify RN    Comment 2 Document in Chart   Glucose, capillary     Status: Abnormal   Collection Time: 06/23/22 11:26 PM  Result Value Ref Range   Glucose-Capillary 108 (H) 70 - 99 mg/dL    Comment: Glucose reference range applies only to samples taken after fasting for at least 8 hours.   Comment 1 Notify RN    Comment 2 Document in Chart   Glucose, capillary     Status: None   Collection Time: 06/24/22  3:29 AM  Result Value Ref Range   Glucose-Capillary 89 70 - 99 mg/dL    Comment: Glucose reference range applies only  to samples taken after fasting for at least 8 hours.   Comment 1 Notify RN    Comment 2 Document in Chart   CBC     Status: Abnormal   Collection Time: 06/24/22  3:55 AM  Result Value Ref Range   WBC 5.5 4.0 - 10.5 K/uL   RBC 3.33 (L) 4.22 - 5.81 MIL/uL   Hemoglobin 9.6 (L) 13.0 - 17.0 g/dL   HCT 29.9 (L) 39.0 - 52.0 %   MCV 89.8 80.0 - 100.0 fL   MCH 28.8 26.0 - 34.0 pg   MCHC 32.1 30.0 - 36.0 g/dL   RDW 16.7 (H) 11.5 - 15.5 %   Platelets 168 150 - 400 K/uL   nRBC 0.0 0.0 - 0.2 %    Comment: Performed at [facility], [place] [address], [place], [place] [address]  Heparin level (unfractionated)     Status: None    Collection Time: 06/24/22  3:55 AM  Result Value Ref Range   Heparin Unfractionated 0.54 0.30 - 0.70 IU/mL    Comment: (NOTE) The clinical reportable range upper limit is being lowered to >1.10 to align with the FDA approved guidance for the current laboratory assay.  If heparin results are below expected values, and patient dosage has  been confirmed, suggest follow up testing of antithrombin III levels. Performed at [facility], [place] [address] [place], [place] [address]   Basic metabolic panel     Status: Abnormal   Collection Time: 06/24/22  3:55 AM  Result Value Ref Range   Sodium 139 135 - 145 mmol/L   Potassium 5.8 (H) 3.5 - 5.1 mmol/L   Chloride 109 98 - 111 mmol/L   CO2 18 (L) 22 - 32 mmol/L   Glucose, Bld 97 70 - 99 mg/dL    Comment: Glucose reference range applies only to samples taken after fasting for at least 8 hours.   BUN 84 (H) 8 - 23 mg/dL   Creatinine, Ser 3.46 (H) 0.61 - 1.24 mg/dL   Calcium 8.2 (L) 8.9 - 10.3 mg/dL   GFR, Estimated 18 (L) >60 mL/min    Comment: (NOTE) Calculated using the CKD-EPI Creatinine Equation (2021)    Anion gap 12 5 - 15    Comment: Performed at [facility], [place] [address], [place], [place] [address]  Potassium     Status: None   Collection Time: 06/24/22  6:40 AM  Result Value Ref Range   Potassium 5.1 3.5 - 5.1 mmol/L    Comment: Performed at [facility], [place] [address], [place], [place] [address]  Glucose, capillary     Status: None   Collection Time: 06/24/22  8:17 AM  Result Value Ref Range   Glucose-Capillary 88 70 - 99 mg/dL    Comment: Glucose reference range applies only to samples taken after fasting for at least 8 hours.  Potassium     Status: Abnormal   Collection Time: 06/24/22 10:39 AM  Result Value Ref Range   Potassium 5.4 (H) 3.5 - 5.1 mmol/L    Comment: Performed at [facility], [place] [address] [place], [place] [address]   Magnesium     Status: None   Collection Time: 06/24/22 10:39 AM  Result Value Ref Range   Magnesium 2.0 1.7 - 2.4 mg/dL    Comment: Performed at [facility], [place] [address] [place], [place] [address]  Phosphorus     Status: Abnormal  Collection Time: 06/24/22 10:39 AM  Result Value Ref Range   Phosphorus 5.7 (H) 2.5 - 4.6 mg/dL    Comment: Performed at [facility], [place] [address] [place], [place] [address]    DG CHEST PORT 1 VIEW  Result Date: 06/22/2022 CLINICAL DATA:  Endotracheal and orogastric tube EXAM: PORTABLE CHEST 1 VIEW COMPARISON:  Chest x-ray 06/22/2022 FINDINGS: There is a new endotracheal tube with distal tip 8 mm above the carina projecting to the right. The cardiomediastinal silhouette is within normal limits. Sternotomy wires are present. Enteric tube extends below the diaphragm. Right chest port catheter tip projects over the distal SVC. Diffuse interstitial and hazy opacities are seen throughout the entire left lung and in the right upper lobe. Opacities have increased on the left. There is no pleural effusion or pneumothorax. No acute osseous findings. IMPRESSION: 1. New endotracheal tube with distal tip 8 mm above the carina projecting to the right. Recommend retraction by 1 cm. 2. Diffuse interstitial and hazy opacities throughout the entire left lung and in the right upper lobe. Opacities have increased on the left. Findings may represent edema and/or infection. Electronically Signed   By: Ronney Asters M.D.   On: 06/22/2022 19:03   DG Abd 1 View  Result Date: 06/22/2022 CLINICAL DATA:  Evaluate OG tube EXAM: ABDOMEN - 1 VIEW COMPARISON:  None Available. FINDINGS: The OG tube terminates in the stomach. IMPRESSION: The OG tube terminates in the stomach. Electronically Signed   By: Dorise Bullion III M.D.   On: 06/22/2022 17:54    Assessment/Plan **AHRF: ventilated currently - being treated for broad ddx inc HCAP, fungal PNA and  possible immunotherapy assoc pneumonitis.   **AKI:  recently dx with membranoproliferative IgA = no report of assoc with his chemo or immunotherapy on lit review.  Tried tarpeyo but allergic so was started on pred recently.  Now with AKI in setting of acute illness and episodes of hypotension -- suspect this is ATN on top of his underlying GN.  He's nonoliguric with no current indications for RRT.  He's on high dose steroids for pneumonitis which would cover any acute IgA, would not give additional immunosuppression at this time.  Continue max supportive care -- being volume expanded today, agree with primary.  Renal US pending. Repeat UA today.   **bilateral DVTs: on heparin  **ESBL E coli UTI: meropenem per primary.   **CAD/dCHF/PAD: meds per primary  **DM  Will follow.  D/w wife in detail bedside today.   Justin Mend 06/24/2022, 11:51 AM

## 2022-06-24 NOTE — Progress Notes (Addendum)
eLink Physician-Brief Progress Note Patient Name: Jorge Adams DOB: September 23, 1950 MRN: [id number]   Date of Service  06/24/2022  HPI/Events of Note  Having some twitching and possible seizure-like activity. Not new. Mentioned in a note from Dr Erin Fulling on 12/2. EEG ordered but not done yet.  Bedside is increasing prop gtt. Did you want any other interventions done?   Discussed with RN. Twitching in arms and leg, follow commands. Wide awake,  Similar events on saturday.  EEG not done yet, was ordered. on propofol.  eICU Interventions  Ok to increase propofol.      Intervention Category Intermediate Interventions: Other: (twitching)  [name] 06/24/2022, 4:15 AM  5 AM Potassium 5.8, cr up to 4.46  Discussed with RN.100 ml in this shift. MAP 65, on levophed at 2 Hyperkalemia Rx ordered. Has OG tube.

## 2022-06-24 NOTE — Progress Notes (Signed)
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 He completed 6 cycles caboplatin/paclitaxel, last dose 02/04/22. He was delayed in starting consolidative immunotherapy due to deconditioning and radiation induced esophagitis. He was treated with steroid taper in 02/2022 for a couple of weeks. He then started durvalumab on 9/7. He was admitted to the hospital 9/20 to 9/27 for acute kidney injury and nephrotic syndrome. He had renal biopsy on 9/26 with findings of ATN and Focal Proliferative Glomerulonephritis with IgA dominant immune-complex deposition. He has been following with renal and patient's wife reports he was started on steroid treatment for the renal disease. He had second durvalumab infusion on 04/24/22.  He presented 11/28 with 3 months of cough and 3 weeks progressive dyspnea, orthopnea. +wheezing and subjective fevers. On no O2 at home. Found to have acute hypoxemic respiratory failure due to multifocal pneumonitis and presumed PE with +LE duplex.   He has been started on heparin drip for DVT and presumed PE. Echo 11/29 does not indicate right heart strain.   Pertinent  Medical History  3b NSCLC: last radiation 02/08/2022.  S/P carboplatin, paclitaxel x 6 cycles also finished in July.  He is now s/p 2 cycles of consolidation immunotherapy with imfinzi  last dose seems to have been beginning of this month.   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 rate 11, height 5\' 10"  (1.778 m), weight 79.2 kg, SpO2 100 %.    Vent Mode: PRVC FiO2 (%):  [40 %] 40 % Set Rate:  [26 bmp] 26 bmp Vt Set:  [580 mL] 580 mL PEEP:  [8 cmH20] 8 cmH20 Plateau Pressure:  [23 cmH20-28 cmH20] 27 cmH20   Intake/Output Summary (Last 24 hours) at 06/24/2022 0829 Last data filed at 06/24/2022 0800 Gross per 24 hour  Intake 1572.78 ml  Output 720 ml  Net 852.78 ml   Filed Weights   06/22/22 1155 06/23/22 0405 06/24/22 0500  Weight: 81 kg 81.5 kg 79.2 kg    Examination: General appearance: 71 y.o., male, intubated Eyes: PERRL, tracking appropriately Lungs: diminished bl, equal chest rise CV: tachy RR, no murmur  Abdomen: Soft, non-tender; non-distended, BS present  Extremities: No peripheral edema, warm Skin: Normal turgor and texture; no rash Neuro: follows commands   Labs/imaging reviewed: Fungitell, PJP PCR pending ANCAs, GBM pending 12/2 Sputum culture pending  No new imaging  Resolved Hospital Problem list     Assessment & Plan:  Acute Hypoxemic Respiratory Failure: Drug induced pneumonitis vs pneuomnia vs PJP vs ARDS from ESBL UTI, PE Drug induced Pneumonitis Suspected Pulmonary Emboli Stage IIIB non-small cell lung  cancer - Continue mechanical ventilation, daily SAT/SBT as able - Continue meropenem as below - Continue atovaquone for empiric pneumocystis treatment. LDH elevated. Follow up fungitell. Follow up pneumocystis PCR from 12/2 tracheal aspirate - continue solumedrol for concern of pneumonitis  - Hold diuresis with hypotension and  elevated Cr  ESBL E Coli UTI - meropenem 11/28-  Hypotension from sedation - levophed for MAP 65 or greater  Acute Bilateral DVT - continue heparin drip per pharmacy  AKI on CKD IIIb-IV Focal Proliferative Glomerulonephritis with IgA dominant immune-complex deposition Metabolic Acidosis IVC with a great deal of respiratory variation on bedside US - trial albumin, start bicarb infusion - renal US - Nephrology consulted  Acute on Chronic Diastolic Heart Failure Hypertension Coronary Artery Disease PAD Type II NSTEMI - hold bidil, metoprolol due to hypotension  - Continue aspirin 81 mg daily, cilostazol 100mg  daily, and rosuvastatin 20mg  daily  Diabetes Mellitus II with vascular disease - SSI  Nutrition - tube feeds  Urinary Retention - continue foley  Best Practice (right click and "Reselect all SmartList Selections" daily)   Diet/type: tubefeeds DVT prophylaxis: systemic heparin GI prophylaxis: N/A Lines: N/A Foley:  Yes, and it is still needed Code Status:  full code Last date of multidisciplinary goals of care discussion [12/1]  Critical care time: 36 minutes    [place] for personal pager PCCM on call pager [phone] until 7pm. Please call Elink 7p-7a. [phone]

## 2022-06-24 NOTE — Progress Notes (Addendum)
ANTICOAGULATION CONSULT NOTE - Follow Up Consult  Pharmacy Consult for Heparin Indication: chest pain/ACS, DVT/PE   Allergies  Allergen Reactions   Metformin And Related     Held due to creatinine elevation September 2023.   Budesonide Rash    Drug rash with oral Tarpeyo     Patient Measurements: Height: 5\' 10"  (177.8 cm) Weight: 81.5 kg (179 lb 10.8 oz) IBW/kg (Calculated) : 73 Heparin Dosing Weight:  80.7 kg  Vital Signs: Temp: 99.1 F (37.3 C) (12/04 0400) Temp Source: Axillary (12/04 0400) BP: 140/81 (12/04 0415) Pulse Rate: 129 (12/04 0415)  Labs: Recent Labs    06/22/22 0500 06/22/22 1056 06/23/22 0400 06/24/22 0355  HGB 8.4*  --  9.0* 9.6*  HCT 26.1*  --  28.2* 29.9*  PLT 177  --  231 168  HEPARINUNFRC 0.41  --  0.52 0.54  CREATININE  --  2.72* 3.02* 3.46*     Estimated Creatinine Clearance: 19.6 mL/min (A) (by C-G formula based on SCr of 3.46 mg/dL (H)).   Assessment: AC/Heme: IV heparin for ACS + DVT and presumed PE LE doppler: Findings consistent with acute deep vein thrombosis involving the right peroneal veins and left popliteal vein.   06/24/2022 HL 0.54 therapeutic on 1650 units/hr Hgb 9.6 plts 168 Per RN no bleeding   Goal of Therapy:  Heparin level 0.3-0.7 units/ml Monitor platelets by anticoagulation protocol: Yes   Plan:  Continue heparin drip at 1650 units/hr. Monitor daily heparin level, CBC, signs/symptoms of bleeding    Thank you for allowing pharmacy to be a part of this patient's care.  Dolly Rias RPh 06/24/2022, 4:30 AM

## 2022-06-24 NOTE — Progress Notes (Signed)
RN called and said the ventilator was alarming. RT checked the Pt's ET tube placement and it was at 22. RT advanced the ET tube to 25 at the lip. RT will continue to monitor.

## 2022-06-24 NOTE — Procedures (Signed)
[facility] Consult [facility]  Routine EEG Report  Video Performed: Performed  Demographics: Patient Name:   Jorge Adams, Jorge Adams  Date of Birth:   1951/07/07  Identification Number:   MRN - [id number]  Study Times:  Study Start Time:   06/24/2022 16:43:00 Study End Time:   06/24/2022 17:14:00  Indication(s): Encephalopathy  Medication:  Fentanyl, Propofol  Technical Summary:  This EEG was performed utilizing standard International 10-20 System of electrode placement. One channel electrocardiogram was monitored. Data were obtained, stored, and interpreted utilizing referential montage recording, with reformatting to longitudinal, transverse bipolar, and referential montages as necessary for interpretation.  State(s):      Medically Induced Coma   Activation Procedures: Hyperventilation: Not performed Photic Stimulation: Not performed   EEG Description:  The patient was sedated on ventilator. There was an absent of posterior dominant alpha rhythm. There were continuous of diffuse 3 Hz polymorphic delta intermixed with 4-5 Hz theta activity noted.  The amplitudes were 60 uV. Sharply contour waves with triphasic features were observed with antero-posterior lag.  There were no physiologic sleep structures observed.  Throughout the record, there was no epileptiform abnormality or lateralizing sign observed.  There was no clinical event noted.  Intermittent myogenic and movement artifacts were noted.      Impression:  This is an abnormal EEG indicative of a severe diffuse encephalopathy. No lateralizing sign is observed.  Although the findings are non-specific in etiology, they are commonly associated with metabolic encephalopathy.  No epileptiform abnormality, electrographic seizures or evidence of status epilepticus were present.    Dr Spero Curb      [facility] For Inpatient follow-up with [facility]  physician please call [facility] [phone]. This is not an outpatient service. Post hospital discharge, please contact hospital directly.

## 2022-06-24 NOTE — Progress Notes (Signed)
Nutrition Follow-up  DOCUMENTATION CODES:   Non-severe (moderate) malnutrition in context of chronic illness  INTERVENTION:  - EN consult received, plan to start tube feeds today.   Initiate tube feeding via OG (tip in stomach): Vital 1.5 at 50 ml/h (1200 ml per day) *Start at 61m/hr and advance by 168mQ8H to goal Prosource TF20 60 ml daily Provides 1880 kcal, 101 gm protein, 917 ml free water daily  - Monitor magnesium, potassium, and phosphorus BID for at least 3 days, MD to replete as needed. - 10082mhiamine daily x5 days to aid in macronutrient metabolism due to risk of refeeding. - Free water flushes per CCM. - Monitor weights.   NUTRITION DIAGNOSIS:   Moderate Malnutrition related to chronic illness (stage 3 lung cancer on chemotherapy) as evidenced by moderate muscle depletion, moderate fat depletion, percent weight loss (23% in 7 months). *ongoing  GOAL:   Patient will meet greater than or equal to 90% of their needs *not being met, TFs to be started   MONITOR:   PO intake, Supplement acceptance, Weight trends  REASON FOR ASSESSMENT:   Consult Enteral/tube feeding initiation and management  ASSESSMENT:   71 6o. male with medical history significant of squamous carcinoma of the right lung Stage III, HTN, HLD, obesity, CAD, DMII, PAD, gout, dysphagia, cervical spine stenosis with myelopathy who presented with SOB found to have acute respiratory failure with community acquired pneumonia.  11/30 placed on BiPAP due to increasing O2 requirements 12/2 Intubated   Since last follow up patient intubated. Only 1 documented meal since admission, 50% of lunch on 11/30.  Patient has been NPO since intubation 12/2. Plan to start tube feeds today.   Concerned patient is at high risk of refeeding syndrome due to malnutrition (significant weight loss, muscle and fat wasting). Discussed ordering thiamine with MD. Discussed starting at 54m77m and advancing slowly by 10mL74m2H to goal with RN.   Medications reviewed and include: Colace, Insulin, Synthroid, Remeron Fentanyl Levophed Propofol @ 9.72mL/38m8am) = 256kcals from fat over 24 hours   Labs reviewed:  Creatinine 3.46   Diet Order:   Diet Order     None       EDUCATION NEEDS:  No education needs have been identified at this time  Skin:  Skin Assessment: Reviewed RN Assessment  Last BM:  12/1  Height:  Ht Readings from Last 1 Encounters:  06/22/22 _0  (1.778 m)   Weight:  Wt Readings from Last 1 Encounters:  06/24/22 79.2 kg   Date/Time Weight Weight in lbs  06/24/22 0500 79.2 kg 174.6 lbs  06/23/22 0405 81.5 kg 179.68 lbs  06/22/22 1155 81 kg 178.57 lbs  06/22/22 0645 81 kg 178.57 lbs  06/21/22 0500 79.5 kg 175.27 lbs  06/20/22 0431 80.7 kg 177.91 lbs  05/26/2022 22:09:38 71.1 kg 156.75 lbs    BMI:  Body mass index is 25.05 kg/m.  Estimated Nutritional Needs:  Kcal:  1800-2000 kcals Protein:  100-115 grams Fluid:  >/= 1.8L    Amiir Heckard Samson FredericDN For contact information, refer to [email or web address]

## 2022-06-25 ENCOUNTER — Inpatient Hospital Stay (HOSPITAL_COMMUNITY): Payer: Medicare HMO

## 2022-06-25 DIAGNOSIS — J189 Pneumonia, unspecified organism: Secondary | ICD-10-CM | POA: Diagnosis not present

## 2022-06-25 LAB — CULTURE, RESPIRATORY W GRAM STAIN: Culture: NO GROWTH

## 2022-06-25 LAB — GLUCOSE, CAPILLARY
Glucose-Capillary: 171 mg/dL — ABNORMAL HIGH (ref 70–99)
Glucose-Capillary: 138 mg/dL — ABNORMAL HIGH (ref 70–99)
Glucose-Capillary: 167 mg/dL — ABNORMAL HIGH (ref 70–99)
Glucose-Capillary: 203 mg/dL — ABNORMAL HIGH (ref 70–99)

## 2022-06-25 LAB — CBC
HCT: 26.3 % — ABNORMAL LOW (ref 39.0–52.0)
Hemoglobin: 8.4 g/dL — ABNORMAL LOW (ref 13.0–17.0)
MCH: 28.8 pg (ref 26.0–34.0)
MCHC: 31.9 g/dL (ref 30.0–36.0)
MCV: 90.1 fL (ref 80.0–100.0)
Platelets: 142 10*3/uL — ABNORMAL LOW (ref 150–400)
RBC: 2.92 MIL/uL — ABNORMAL LOW (ref 4.22–5.81)
RDW: 16.6 % — ABNORMAL HIGH (ref 11.5–15.5)
WBC: 5.1 10*3/uL (ref 4.0–10.5)
nRBC: 0 % (ref 0.0–0.2)

## 2022-06-25 LAB — POTASSIUM
Potassium: 5.5 mmol/L — ABNORMAL HIGH (ref 3.5–5.1)
Potassium: 5.3 mmol/L — ABNORMAL HIGH (ref 3.5–5.1)
Potassium: 4.7 mmol/L (ref 3.5–5.1)
Potassium: 5 mmol/L (ref 3.5–5.1)

## 2022-06-25 LAB — MAGNESIUM
Magnesium: 2 mg/dL (ref 1.7–2.4)
Magnesium: 1.8 mg/dL (ref 1.7–2.4)

## 2022-06-25 LAB — BASIC METABOLIC PANEL
Anion gap: 12 (ref 5–15)
BUN: 91 mg/dL — ABNORMAL HIGH (ref 8–23)
CO2: 22 mmol/L (ref 22–32)
Calcium: 7.7 mg/dL — ABNORMAL LOW (ref 8.9–10.3)
Chloride: 101 mmol/L (ref 98–111)
Creatinine, Ser: 3.79 mg/dL — ABNORMAL HIGH (ref 0.61–1.24)
GFR, Estimated: 16 mL/min — ABNORMAL LOW (ref >60)
Glucose, Bld: 151 mg/dL — ABNORMAL HIGH (ref 70–99)
Potassium: 5.4 mmol/L — ABNORMAL HIGH (ref 3.5–5.1)
Sodium: 135 mmol/L (ref 135–145)

## 2022-06-25 LAB — PHOSPHORUS
Phosphorus: 6.3 mg/dL — ABNORMAL HIGH (ref 2.5–4.6)
Phosphorus: 5.8 mg/dL — ABNORMAL HIGH (ref 2.5–4.6)

## 2022-06-25 LAB — HEPARIN LEVEL (UNFRACTIONATED): Heparin Unfractionated: 0.48 IU/mL (ref 0.30–0.70)

## 2022-06-25 MED ORDER — THIAMINE MONONITRATE 100 MG PO TABS
100.0000 mg | ORAL_TABLET | Freq: Every day | ORAL | Status: DC
  Administered 2022-06-26: 100 mg
  Filled 2022-06-25: qty 1

## 2022-06-25 MED ORDER — SODIUM ZIRCONIUM CYCLOSILICATE 10 G PO PACK
10.0000 g | PACK | Freq: Every day | ORAL | Status: AC
  Administered 2022-06-25: 10 g via ORAL
  Filled 2022-06-25: qty 1

## 2022-06-25 MED ORDER — HYDROCODONE-ACETAMINOPHEN 5-325 MG PO TABS: 1.0000 | ORAL_TABLET | ORAL | Status: DC | PRN

## 2022-06-25 MED ORDER — FUROSEMIDE 10 MG/ML IJ SOLN
80.0000 mg | Freq: Two times a day (BID) | INTRAMUSCULAR | Status: DC
  Administered 2022-06-25 – 2022-06-26 (×2): 80 mg via INTRAVENOUS
  Filled 2022-06-25 (×2): qty 8

## 2022-06-25 MED ORDER — FENOFIBRATE 160 MG PO TABS
160.0000 mg | ORAL_TABLET | Freq: Every day | ORAL | Status: DC
  Administered 2022-06-26: 160 mg
  Filled 2022-06-25: qty 1

## 2022-06-25 MED ORDER — ROSUVASTATIN CALCIUM 20 MG PO TABS
20.0000 mg | ORAL_TABLET | Freq: Every day | ORAL | Status: DC
  Administered 2022-06-26: 20 mg
  Filled 2022-06-25: qty 1

## 2022-06-25 NOTE — Progress Notes (Signed)
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 He completed 6 cycles caboplatin/paclitaxel, last dose 02/04/22. He was delayed in starting consolidative immunotherapy due to deconditioning and radiation induced esophagitis. He was treated with steroid taper in 02/2022 for a couple of weeks. He then started durvalumab on 9/7. He was admitted to the hospital 9/20 to 9/27 for acute kidney injury and nephrotic syndrome. He had renal biopsy on 9/26 with findings of ATN and Focal Proliferative Glomerulonephritis with IgA dominant immune-complex deposition. He has been following with renal and patient's wife reports he was started on steroid treatment for the renal disease. He had second durvalumab infusion on 04/24/22.  He presented 11/28 with 3 months of cough and 3 weeks progressive dyspnea, orthopnea. +wheezing and subjective fevers. On no O2 at home. Found to have acute hypoxemic respiratory failure due to multifocal pneumonitis and presumed PE with +LE duplex.   He has been started on heparin drip for DVT and presumed PE. Echo 11/29 does not indicate right heart strain.   Pertinent  Medical History  3b NSCLC: last radiation 02/08/2022.  S/P carboplatin, paclitaxel x 6 cycles also finished in July.  He is now s/p 2 cycles of consolidation immunotherapy with imfinzi  last dose seems to have been beginning of this month.   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 Sedated, tachypneic with any weans. Not making much urine.  Objective   Blood pressure (!) 143/61, pulse (!) 115, temperature 98.7 F (37.1 C), temperature source Axillary, resp. rate (!) 27, height 5\' 10"  (1.778 m), weight 79.2 kg, SpO2 95 %.    Vent Mode: PRVC FiO2 (%):  [30 %-45 %] 40 % Set Rate:  [26 bmp] 26 bmp Vt Set:  [580 mL] 580 mL PEEP:  [8 cmH20] 8 cmH20 Plateau Pressure:  [13 cmH20-29 cmH20] 27 cmH20   Intake/Output Summary (Last 24 hours) at 06/25/2022 0973 Last data filed at 06/25/2022 5329 Gross per 24 hour  Intake 4267.13 ml  Output 545 ml  Net 3722.13 ml    Filed Weights   06/22/22 1155 06/23/22 0405 06/24/22 0500  Weight: 81 kg 81.5 kg 79.2 kg    Examination: On vent, triggering Lungs crackles on R Developing anasarca Scattered bruising Ext warm to touch Does move all 4 ext to command with sedation wean  BUN/Cr/K rising CBC okay CXR maybe partial clearing on left, RUL still looks bad  Resolved Hospital Problem list     Assessment & Plan:  Acute Hypoxemic Respiratory Failure: Drug induced pneumonitis vs pneuomnia vs PJP vs ARDS from ESBL UTI, PE Drug induced Pneumonitis Suspected Pulmonary Emboli Stage IIIB non-small cell lung cancer - Continue mechanical ventilation, daily SAT/SBT as able; do not think we can liberate from vent until more euvolemic - Continue meropenem as  below - Continue atovaquone for empiric pneumocystis treatment. LDH elevated. Follow up fungitell. Follow up pneumocystis PCR from 12/2 tracheal aspirate (pending) - continue solumedrol for concern of pneumonitis   ESBL E Coli UTI - 7 days meropenem should cover any bacterial component to PNA and UTI  Hypotension from sedation - levophed for MAP 65 or greater  Acute Bilateral  DVT - continue heparin drip per pharmacy  AKI on CKD IIIb-IV Focal Proliferative Glomerulonephritis with IgA dominant immune-complex deposition Metabolic Acidosis - Nephrology following suspect heading for CRRT in next day or two - Lokelma PRN hyperkalemia  Acute on Chronic Diastolic Heart Failure Hypertension Coronary Artery Disease PAD Type II NSTEMI - hold bidil, metoprolol due to hypotension  - Continue aspirin 81 mg daily, cilostazol 100mg  daily, and rosuvastatin 20mg  daily  Diabetes Mellitus II with vascular disease - SSI  Nutrition - tube feeds  Urinary Retention - continue foley  GOC- will ask PMT to help family through difficult time; I am worried he may not make it through this without a significant compromise to his QoL  Best Practice (right click and "Reselect all SmartList Selections" daily)   Diet/type: tubefeeds DVT prophylaxis: systemic heparin GI prophylaxis: N/A Lines: N/A Foley:  Yes, and it is still needed Code Status:  full code Last date of multidisciplinary goals of care discussion [12/1]  Critical care time: 33 minutes    Erskine Emery MD  See Shea Evans for personal pager PCCM on call pager [phone] until 7pm. Please call Elink 7p-7a. [phone]

## 2022-06-25 NOTE — Progress Notes (Signed)
ANTICOAGULATION CONSULT NOTE - Follow Up Consult  Pharmacy Consult for Heparin Indication: pulmonary embolus and DVT  Allergies  Allergen Reactions   Metformin And Related     Held due to creatinine elevation September 2023.   Budesonide Rash    Drug rash with oral Tarpeyo     Patient Measurements: Height: 5\' 10"  (177.8 cm) Weight: 79.2 kg (174 lb 9.7 oz) IBW/kg (Calculated) : 73 Heparin Dosing Weight: TBW  Vital Signs: Temp: 98.7 F (37.1 C) (12/05 0400) Temp Source: Axillary (12/05 0400) BP: 143/61 (12/05 0845) Pulse Rate: 115 (12/05 0845)  Labs: Recent Labs    06/23/22 0400 06/24/22 0355 06/25/22 0407  HGB 9.0* 9.6* 8.4*  HCT 28.2* 29.9* 26.3*  PLT 231 168 142*  HEPARINUNFRC 0.52 0.54 0.48  CREATININE 3.02* 3.46* 3.79*    Estimated Creatinine Clearance: 17.9 mL/min (A) (by C-G formula based on SCr of 3.79 mg/dL (H)).   Medications:  Infusions:   feeding supplement (VITAL 1.5 CAL) 30 mL/hr at 06/25/22 0718   fentaNYL infusion INTRAVENOUS 100 mcg/hr (06/25/22 0718)   heparin 1,650 Units/hr (06/25/22 0718)   meropenem (MERREM) IV Stopped (06/24/22 2210)   norepinephrine (LEVOPHED) Adult infusion 8 mcg/min (06/25/22 0804)   propofol (DIPRIVAN) infusion 20 mcg/kg/min (06/25/22 0718)   sodium bicarbonate 150 mEq in dextrose 5 % 1,150 mL infusion 125 mL/hr at 06/25/22 0755    Assessment: 71 yo male presents with SOB and fatigue, medical history significant of squamous carcinoma of the right lung. Pharmacy to dose heparin drip for ACS and dopplers + bilateral LE DVT.   Unable to obtain CTa due to renal function, but treating empirically for PE.  Echo 11/29 does not indicate right heart strain.   Today, 06/25/2022: Heparin level 0.48, therapeutic on heparin  CBC:  Hgb remains low/decreased to 8.4, Plt down to 142.  No bleeding or complications reported SCr further increased to 3.79  Goal of Therapy:  Heparin level 0.3-0.7 units/ml Monitor platelets by  anticoagulation protocol: Yes   Plan:  Continue heparin IV infusion at 1650 units/hr Daily heparin level and CBC   Gretta Arab PharmD, BCPS [facility] main pharmacy [phone] 06/25/2022 9:08 AM

## 2022-06-25 NOTE — Progress Notes (Signed)
Pharmacy Antibiotic Note  Jorge Adams is a 71 y.o. male admitted on 06/11/2022 with pneumonitis vs pneumonia and ESBL Ecoli UTI.  He has been on immunotherapy and steroids prior to admission for NSCLC and immunotherapy related kidney disease.  Pharmacy is consulted for meropenem dosing.  Plan: Meropenem 1 g IV every 12 hours Continue Atovaquone per MD.  Monitor clinical progress, renal function, and LOT   Height: 5\' 10"  (177.8 cm) Weight: 79.2 kg (174 lb 9.7 oz) IBW/kg (Calculated) : 73  Temp (24hrs), Avg:99.1 F (37.3 C), Min:97.8 F (36.6 C), Max:100.1 F (37.8 C)  Recent Labs  Lab 06/08/2022 1705 06/19/22 0515 06/21/22 0439 06/22/22 0500 06/22/22 1056 06/23/22 0400 06/24/22 0355 06/25/22 0407  WBC 4.5   < > 7.9 5.9  --  8.6 5.5 5.1  CREATININE 2.42*   < > 2.72*  --  2.72* 3.02* 3.46* 3.79*  LATICACIDVEN 0.9  --   --   --   --   --   --   --    < > = values in this interval not displayed.     Estimated Creatinine Clearance: 17.9 mL/min (A) (by C-G formula based on SCr of 3.79 mg/dL (H)).    Allergies  Allergen Reactions   Metformin And Related     Held due to creatinine elevation September 2023.   Budesonide Rash    Drug rash with oral Tarpeyo     Antimicrobials this admission:  11/28 CTX x 1 11/28 Doxy x 1 11/28 Cefepime >>12/2 11/29 Vanc >> 11/29 12/2 atovaquone >>  12/2 meropenem >>  Microbiology results:  11/28 strep pneumo Ur Ag: neg 11/28 Legionella Ur Ag: neg 11/28 Resp panel PCR: none detected 11/28 Covid/Influenza: neg/neg 11/28 BCx: NGF 11/28 UCx: 40k ESBL Ecoli 11/28 MRSA PCR: not detected 11/29 MRSA PCR: not detected 12/1 Fungitell (serum): ip 12/2 Pneumocystis PCR: ip 12/2 Tracheal aspirate: ngtd   Thank you for allowing pharmacy to be a part of this patient's care.  Gretta Arab PharmD, BCPS [facility] main pharmacy [phone] 06/25/2022 9:23 AM

## 2022-06-25 NOTE — Progress Notes (Signed)
[facility] Progress Note   Subjective:   No major changes.  Remains intubated. I/Os yest 4.1 / 0.545L - net + 5.8 for admission.  Wife and 2 SIL bedside.   Objective Vitals:   06/25/22 1130 06/25/22 1145 06/25/22 1200 06/25/22 1215  BP: 129/67 123/68 (!) 118/57 (!) 124/55  Pulse: (!) 113 (!) 112 (!) 107 (!) 105  Resp: (!) 26 (!) 21 (!) 26 (!) 23  Temp:      TempSrc:      SpO2: 96% 95% 95% 95%  Weight:      Height:       Physical Exam Gen: intubated, sedated  Eyes: anicteric ENT:ETT in place Neck: supple CV: tachycardic, no rub Abd: soft Lungs: coarse ant GU: foley with clear yellow urine today Extr: 1-2+ pitting edema throughout Neuro:sedated Skin: no rashes noted, R chest wall port accessed c/d/i  Additional Objective Labs: Basic Metabolic Panel: Recent Labs  Lab 06/23/22 0400 06/24/22 0355 06/24/22 0640 06/24/22 1705 06/24/22 2125 06/25/22 0407 06/25/22 1024 06/25/22 1138  NA 138 139  --   --   --  135  --   --   K 4.9 5.8*   < > 5.7*   < > 5.4*  5.3* 4.7 5.0  CL 108 109  --   --   --  101  --   --   CO2 20* 18*  --   --   --  22  --   --   GLUCOSE 189* 97  --   --   --  151*  --   --   BUN 73* 84*  --   --   --  91*  --   --   CREATININE 3.02* 3.46*  --   --   --  3.79*  --   --   CALCIUM 8.0* 8.2*  --   --   --  7.7*  --   --   PHOS  --   --    < > 6.4*  --  6.3*  --  5.8*   < > = values in this interval not displayed.   Liver Function Tests: Recent Labs  Lab 06/20/22 0422 06/21/22 0439 06/22/22 1056  AST 40 45* 38  ALT 18 18 18   ALKPHOS 50 77 108  BILITOT 0.5 0.6 0.6  PROT 4.6* 5.4* 5.2*  ALBUMIN 1.6* 1.8* 1.7*   No results for input(s): "LIPASE", "AMYLASE" in the last 168 hours. CBC: Recent Labs  Lab 06/20/22 0422 06/20/22 0940 06/21/22 0439 06/22/22 0500 06/23/22 0400 06/24/22 0355 06/25/22 0407  WBC 5.1 6.1 7.9 5.9 8.6 5.5 5.1  NEUTROABS 4.6 5.3 7.1  --   --   --   --   HGB 7.0* 7.4* 9.1* 8.4* 9.0* 9.6* 8.4*   HCT 21.7* 22.8* 27.6* 26.1* 28.2* 29.9* 26.3*  MCV 91.2 91.2 88.7 90.3 91.3 89.8 90.1  PLT 176 169 188 177 231 168 142*   Blood Culture    Component Value Date/Time   SDES  06/22/2022 1759    TRACHEAL ASPIRATE Performed at [facility] [address], [place], [place] [address]    SPECREQUEST  06/22/2022 1759    NONE Performed at [facility], [place] [address], [place], [place] [address]    CULT  06/22/2022 1759    NO GROWTH 2 DAYS Performed at [facility], [place] [address], [place], [place] [address]    REPTSTATUS 06/25/2022 FINAL  06/22/2022 1759    Cardiac Enzymes: Recent Labs  Lab 06/11/2022 1705  CKTOTAL 63   CBG: Recent Labs  Lab 06/24/22 2032 06/24/22 2355 06/25/22 0328 06/25/22 0805 06/25/22 1129  GLUCAP 219* 194* 171* 138* 167*   Iron Studies: No results for input(s): "IRON", "TIBC", "TRANSFERRIN", "FERRITIN" in the last 72 hours. @lablastinr3 @ Studies/Results: DG Chest Port 1 View  Result Date: 06/25/2022 CLINICAL DATA:  Hypoxia EXAM: PORTABLE CHEST 1 VIEW COMPARISON:  06/24/2022 FINDINGS: Right Port-A-Cath, endotracheal tube and NG tube remain in place, unchanged. Prior CABG. Heart is normal size. Airspace disease noted bilaterally, most pronounced in the right upper lobe and left lower lobe. Right upper lobe airspace disease has progressed since prior study. No visible effusions. No acute bony abnormality. IMPRESSION: Patchy bilateral airspace disease most pronounced in the right upper lobe and left lower lobe, worsening in the right upper lobe since prior study. Electronically Signed   By: Rolm Baptise M.D.   On: 06/25/2022 02:15   US RENAL  Result Date: 06/24/2022 CLINICAL DATA:  Acute kidney injury EXAM: RENAL / URINARY TRACT ULTRASOUND COMPLETE COMPARISON:  04/11/2022 FINDINGS: Evaluation is somewhat limited by patient inability to hold breath or to move. Right Kidney: Renal measurements: 12.5 x 6.6 x 6.8 cm =  volume: 294 mL. Echogenicity within normal limits. No mass or hydronephrosis visualized. Left Kidney: Renal measurements: 13.0 x 5.8 x 5.1 cm = volume: 203 mL. Echogenicity within normal limits. No mass or hydronephrosis visualized. Bladder: A Foley is noted within the bladder. Other: None. IMPRESSION: No significant sonographic abnormality of the kidneys. Electronically Signed   By: Merilyn Baba M.D.   On: 06/24/2022 18:51   DG CHEST PORT 1 VIEW  Result Date: 06/24/2022 CLINICAL DATA:  Hypoxia EXAM: PORTABLE CHEST 1 VIEW COMPARISON:  Previous studies including the examination of 06/22/2022 FINDINGS: Transverse diameter of heart is increased. There is previous coronary bypass surgery. Tip of endotracheal tube is 6.7 cm above the carina. Tip of right IJ chest port is seen in superior vena cava. Enteric tube is noted traversing the esophagus. There is interval decrease in interstitial densities in left lung. Still, there is residual prominence of interstitial markings in left lung, more so in the left lower lung field. Increased markings in right upper lung fields have not changed significantly. No new focal infiltrates are noted. Lateral CP angles are clear. There is no pneumothorax. IMPRESSION: There is interval improvement in aeration of left lung suggesting resolving pulmonary edema. There is residual prominence of interstitial markings in both lungs, more so in the right upper lung field. This finding may suggest residual asymmetric pulmonary edema or underlying pneumonia. There are no new focal infiltrates. Electronically Signed   By: Elmer Picker M.D.   On: 06/24/2022 15:30   Medications:  feeding supplement (VITAL 1.5 CAL) 30 mL/hr at 06/25/22 0718   fentaNYL infusion INTRAVENOUS 100 mcg/hr (06/25/22 0718)   heparin 1,650 Units/hr (06/25/22 0718)   meropenem (MERREM) IV Stopped (06/25/22 1040)   norepinephrine (LEVOPHED) Adult infusion 8 mcg/min (06/25/22 0804)   propofol (DIPRIVAN) infusion  20 mcg/kg/min (06/25/22 0718)   sodium bicarbonate 150 mEq in dextrose 5 % 1,150 mL infusion 125 mL/hr at 06/25/22 0755    aspirin  81 mg Per Tube Daily   atovaquone  750 mg Per Tube BID WC   brimonidine  2 drop Both Eyes BID   Chlorhexidine Gluconate Cloth  6 each Topical Daily   cilostazol  100 mg Per Tube BID  docusate  100 mg Per Tube BID   dorzolamide-timolol  1 drop Both Eyes BID   feeding supplement (PROSource TF20)  60 mL Per Tube Daily   [START ON 2022-07-06] fenofibrate  160 mg Per Tube Daily   insulin aspart  0-9 Units Subcutaneous Q4H   ipratropium-albuterol  3 mL Nebulization QID   levothyroxine  50 mcg Per Tube QAC breakfast   methylPREDNISolone (SOLU-MEDROL) injection  125 mg Intravenous Daily   mirtazapine  7.5 mg Per Tube QHS   Netarsudil-Latanoprost  1 drop Both Eyes QHS   mouth rinse  15 mL Mouth Rinse Q2H   pantoprazole (PROTONIX) IV  40 mg Intravenous Daily   polyethylene glycol  17 g Per Tube Daily   [START ON July 06, 2022] rosuvastatin  20 mg Per Tube Daily   sodium chloride flush  10-40 mL Intracatheter Q12H   [START ON 2022/07/06] thiamine  100 mg Per Tube Daily    Assessment/Plan **AHRF: ventilated currently - being treated for broad ddx inc HCAP, fungal PNA and possible immunotherapy assoc pneumonitis.    **AKI:  recently dx with membranoproliferative IgA = no report of assoc with his chemo or immunotherapy on lit review.  Tried tarpeyo but allergic so was started on pred recently.  Now with AKI in setting of acute illness and episodes of hypotension -- suspect this is ATN on top of his underlying GN. 12/4 renal US unrevealing. Repeat UA 06/24/22 from foley with blood, protein, RBC, WBC, many bacteria - no WBC casts to suggest AIN prompting abx change.  He's on high dose steroids for pneumonitis which would cover any acute IgA, would not give additional immunosuppression at this time.  He's nonoliguric with no current indications for RRT but worsening daily and I  suspect indications will arise in the next 24-48h.  Give lasix 80 IV BID today to help prevent further vol overload.   Continue max supportive care.    **bilateral DVTs: on heparin   **ESBL E coli UTI: meropenem per primary.    **CAD/dCHF/PAD: meds per primary   **DM   Will follow.  D/w wife in detail bedside today.   Jannifer Hick MD 06/25/2022, 12:43 PM  [facility] Pager: [phone]

## 2022-06-26 DIAGNOSIS — Z515 Encounter for palliative care: Secondary | ICD-10-CM

## 2022-06-26 DIAGNOSIS — R52 Pain, unspecified: Secondary | ICD-10-CM

## 2022-06-26 DIAGNOSIS — R4589 Other symptoms and signs involving emotional state: Secondary | ICD-10-CM

## 2022-06-26 DIAGNOSIS — J189 Pneumonia, unspecified organism: Principal | ICD-10-CM

## 2022-06-26 DIAGNOSIS — Z7189 Other specified counseling: Secondary | ICD-10-CM

## 2022-06-26 DIAGNOSIS — R451 Restlessness and agitation: Secondary | ICD-10-CM

## 2022-06-26 DIAGNOSIS — J9601 Acute respiratory failure with hypoxia: Secondary | ICD-10-CM | POA: Diagnosis not present

## 2022-06-26 DIAGNOSIS — R0600 Dyspnea, unspecified: Secondary | ICD-10-CM

## 2022-06-26 DIAGNOSIS — N179 Acute kidney failure, unspecified: Secondary | ICD-10-CM | POA: Diagnosis not present

## 2022-06-26 DIAGNOSIS — Z79899 Other long term (current) drug therapy: Secondary | ICD-10-CM

## 2022-06-26 LAB — RENAL FUNCTION PANEL
Albumin: <1.5 g/dL — ABNORMAL LOW (ref 3.5–5.0)
Anion gap: 13 (ref 5–15)
BUN: 88 mg/dL — ABNORMAL HIGH (ref 8–23)
CO2: 26 mmol/L (ref 22–32)
Calcium: 7 mg/dL — ABNORMAL LOW (ref 8.9–10.3)
Chloride: 92 mmol/L — ABNORMAL LOW (ref 98–111)
Creatinine, Ser: 3.85 mg/dL — ABNORMAL HIGH (ref 0.61–1.24)
GFR, Estimated: 16 mL/min — ABNORMAL LOW (ref >60)
Glucose, Bld: 197 mg/dL — ABNORMAL HIGH (ref 70–99)
Phosphorus: 6.2 mg/dL — ABNORMAL HIGH (ref 2.5–4.6)
Potassium: 4.6 mmol/L (ref 3.5–5.1)
Sodium: 131 mmol/L — ABNORMAL LOW (ref 135–145)

## 2022-06-26 LAB — TRIGLYCERIDES: Triglycerides: 85 mg/dL (ref <150)

## 2022-06-26 LAB — GLUCOSE, CAPILLARY
Glucose-Capillary: 192 mg/dL — ABNORMAL HIGH (ref 70–99)
Glucose-Capillary: 212 mg/dL — ABNORMAL HIGH (ref 70–99)
Glucose-Capillary: 214 mg/dL — ABNORMAL HIGH (ref 70–99)
Glucose-Capillary: 154 mg/dL — ABNORMAL HIGH (ref 70–99)
Glucose-Capillary: 172 mg/dL — ABNORMAL HIGH (ref 70–99)

## 2022-06-26 LAB — CBC
HCT: 23.9 % — ABNORMAL LOW (ref 39.0–52.0)
Hemoglobin: 7.6 g/dL — ABNORMAL LOW (ref 13.0–17.0)
MCH: 28.1 pg (ref 26.0–34.0)
MCHC: 31.8 g/dL (ref 30.0–36.0)
MCV: 88.5 fL (ref 80.0–100.0)
Platelets: 112 10*3/uL — ABNORMAL LOW (ref 150–400)
RBC: 2.7 MIL/uL — ABNORMAL LOW (ref 4.22–5.81)
RDW: 16.1 % — ABNORMAL HIGH (ref 11.5–15.5)
WBC: 4.8 10*3/uL (ref 4.0–10.5)
nRBC: 0 % (ref 0.0–0.2)

## 2022-06-26 LAB — HEPARIN LEVEL (UNFRACTIONATED): Heparin Unfractionated: 0.37 IU/mL (ref 0.30–0.70)

## 2022-06-26 MED ORDER — LORAZEPAM 2 MG/ML IJ SOLN
1.0000 mg | INTRAMUSCULAR | Status: DC | PRN
  Filled 2022-06-26: qty 1

## 2022-06-26 MED ORDER — POLYVINYL ALCOHOL 1.4 % OP SOLN: 1.0000 [drp] | Freq: Four times a day (QID) | OPHTHALMIC | Status: DC | PRN

## 2022-06-26 MED ORDER — BIOTENE DRY MOUTH MT LIQD: 15.0000 mL | OROMUCOSAL | Status: DC | PRN

## 2022-06-26 MED ORDER — HALOPERIDOL LACTATE 5 MG/ML IJ SOLN: 0.5000 mg | INTRAMUSCULAR | Status: DC | PRN

## 2022-06-26 MED ORDER — GLYCOPYRROLATE 0.2 MG/ML IJ SOLN
0.2000 mg | INTRAMUSCULAR | Status: DC | PRN
  Administered 2022-06-26: 0.2 mg via INTRAVENOUS
  Filled 2022-06-26: qty 1

## 2022-06-27 LAB — PNEUMOCYSTIS PCR: Result Pneumocystis PCR: POSITIVE — AB

## 2022-06-28 ENCOUNTER — Telehealth: Payer: Self-pay | Admitting: Family Medicine

## 2022-06-28 ENCOUNTER — Telehealth: Payer: Self-pay | Admitting: Medical Oncology

## 2022-06-28 NOTE — Telephone Encounter (Signed)
Patient wife called in and just wanted to let Dr. Damita Dunnings know that he passed away on 2022/07/13. His service will be on Sunday and he is at [facility]. She just wanted to say Thank you for all you did for him.

## 2022-06-28 NOTE — Telephone Encounter (Signed)
Juliann Pulse called to let Dr. Julien Nordmann and Dr. Burr Medico know that Jorge Adams passed away wed 07/25/2022. I expressed our condolences.

## 2022-06-30 NOTE — Telephone Encounter (Signed)
Late entry.  I called his wife on 06/28/2022.  She thanked for the call.  Condolences offered.  I was always glad to see this gentleman in clinic.  He will be missed.

## 2022-07-01 LAB — MISC LABCORP TEST (SEND OUT): Labcorp test code: 9985

## 2022-07-17 ENCOUNTER — Ambulatory Visit: Payer: Medicare HMO

## 2022-07-17 ENCOUNTER — Ambulatory Visit: Payer: Medicare HMO | Admitting: Internal Medicine

## 2022-07-17 ENCOUNTER — Other Ambulatory Visit: Payer: Medicare HMO

## 2022-07-22 NOTE — Progress Notes (Signed)
ANTICOAGULATION CONSULT NOTE - Follow Up Consult  Pharmacy Consult for Heparin Indication: pulmonary embolus and DVT  Allergies  Allergen Reactions   Metformin And Related     Held due to creatinine elevation September 2023.   Budesonide Rash    Drug rash with oral Tarpeyo     Patient Measurements: Height: 5\' 10"  (177.8 cm) Weight: 79.2 kg (174 lb 9.7 oz) IBW/kg (Calculated) : 73 Heparin Dosing Weight: TBW  Vital Signs: Temp: 99.4 F (37.4 C) (12/06 0829) Temp Source: Oral (12/06 0829) BP: 131/65 (12/06 0700) Pulse Rate: 103 (12/06 0700)  Labs: Recent Labs    06/24/22 0355 06/25/22 0407 July 04, 2022 0509  HGB 9.6* 8.4* 7.6*  HCT 29.9* 26.3* 23.9*  PLT 168 142* 112*  HEPARINUNFRC 0.54 0.48 0.37  CREATININE 3.46* 3.79* 3.85*     Estimated Creatinine Clearance: 17.6 mL/min (A) (by C-G formula based on SCr of 3.85 mg/dL (H)).   Medications:  Infusions:   feeding supplement (VITAL 1.5 CAL) 50 mL/hr at 2022/07/04 0654   fentaNYL infusion INTRAVENOUS 100 mcg/hr (2022-07-04 0654)   heparin 1,650 Units/hr (2022/07/04 0654)   meropenem (MERREM) IV Stopped (06/25/22 2230)   norepinephrine (LEVOPHED) Adult infusion 6 mcg/min (07/04/2022 0841)   propofol (DIPRIVAN) infusion 20 mcg/kg/min (07/04/22 0654)    Assessment: 72 yo male presents with SOB and fatigue, medical history significant of squamous carcinoma of the right lung. Pharmacy to dose heparin drip for ACS and dopplers + bilateral LE DVT.   Unable to obtain CTa due to renal function, but treating empirically for PE.  Echo 11/29 does not indicate right heart strain.   Today, Jul 04, 2022: Heparin level remains therapeutic on current IV heparin rate of 1650 units/hr CBC:  Hgb remains low/decreased to 7.6, Plt down to 112 No bleeding or complications reported SCr further increased to 3.85  Goal of Therapy:  Heparin level 0.3-0.7 units/ml Monitor platelets by anticoagulation protocol: Yes   Plan:  Continue heparin IV  infusion at 1650 units/hr Daily heparin level and CBC   Adrian Saran, PharmD, BCPS Secure Chat if ?s Jul 04, 2022 10:26 AM

## 2022-07-22 NOTE — Consult Note (Signed)
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  Since being admitted patient has received medical therapies for acute hypoxic respiratory failure in the setting of lung cancer, drug-induced pneumonitis, and suspected pulmonary emboli; required intubation on 06/22/22.  Patient has also received therapies for ESBL E. coli UTI, acute bilateral DVT, acute on chronic diastolic heart failure, and AKI on CKD.  Nephrology following and AKI concerned to be ATN on top of his underlying GN.  Palliative care consulted to assist with complex medical decision making.  Extensive review of EMR prior to seeing patient.  Also discussed care with primary PCCM team prior to presenting to bedside and RN.   ------------------------------------------------------------------------------------------------------------- Advance Care Planning Conversation  Pertinent diagnosis: stage IIIb squamous cell carcinoma of the right lung with satellite nodules and mediastinal adenopathy diagnosed 11/2021, CAD, 2, PAD, immunotherapy associated pneumonitis, ATN/underlying GN, acute renal failure  The patient and/or family consented to a voluntary Advance Care Planning Conversation. Individuals present for the conversation: Able to present to bedside and introduced myself and the role of palliative care team in  patient's care.  Patient lying intubated in bed not responsive to voice or touch.  Multiple family members at bedside including patient's wife, 2 sons, daughter-in-law, and sister-in-law. This provider was present entirety of conversation.  Summary of the conversation:  We were able to discuss patient's current medical conditions and the seriousness of his medical illness extensively.  Patient has ACP documentation on file stating his wishes regarding medical care.  Family acknowledges that patient's quality of life is incredibly important to him and living supported on machines would not be quality of life to him and he would not want to continue in this fashion.  Discussed what a transition to comfort focused care would look like and entail including palliative extubation and management of symptoms at that time.  Discussed that patient is dependent on vent at this time so prognosis is likely minutes to hours with palliative extubation.  Family awaiting on other family to get here to perform palliative extubation.  Spent time offering emotional support through active listening.  All questions answered at that time.  Thanked family for allowing this provider to visit with them today.  Outcome of the conversations and/or documents completed:  Discontinuing medications that are no longer focused on patient's comfort at this time.  Will maintain Levophed without escalation to allow time for family to come to bedside today.  Planning for palliative extubation later today.  I spent 28 minutes providing separately identifiable ACP services with the patient and/or surrogate decision maker in a voluntary, in-person conversation discussing the patient's wishes and goals as detailed in the above note.  Chelsea Aus, DO Palliative Care Provider  -------------------------------------------------------------------------------------------------------------   Updated primary PCCM team and bedside RN about plan for  palliative extubation today once further family members arrive.  Patient currently on Levophed for support which  will be maintained at this time, without escalation, to allow family members to present at bedside.  Levophed will be discontinued once family members have had time to visit and planning for palliative extubation.  Primary Diagnoses  Present on Admission:  CAP (community acquired pneumonia)  Hypertension associated with diabetes ([place])  Hyperlipidemia LDL goal <70  Type 2 diabetes mellitus with vascular disease ([place])  CAD (coronary artery disease)  Hypokalemia  PAD (peripheral artery disease) (H[facility])  Stage III squamous cell carcinoma of right lung (H[facility])  Normocytic anemia  Hypomagnesemia  CKD (chronic kidney disease), stage IV ([facility])  Chronic diastolic CHF (congestive heart failure) (HCC)  Acute respiratory failure with hypoxia (H[facility])  Elevated troponin  UTI (urinary tract infection)   Palliative Review of Systems: Unable to obtain due to patient's medical status.  Past Medical History:  Diagnosis Date   Arthritis    ASHD (arteriosclerotic heart disease)    Atherosclerosis of native arteries of the extremities with intermittent claudication    Cataract    Coronary atherosclerosis of native coronary artery    Diabetes mellitus    NIDDM   Dyslipidemia    ED (erectile dysfunction)    Full dentures    Glaucoma associated with ocular disorder, mild stage    bilateral   Hyperlipidemia    Hypertension    under control, has been on med. > 9 yr.   Impotence of organic origin    Meniscus tear 05/2012   right knee   Myocardial infarction [facility]) 10/2002   Stented coronary artery    Social History   Socioeconomic History   Marital status: Married    Spouse name: Not on file   Number of children: 2   Years of education: Not on file   Highest education level: Not on file  Occupational History   Occupation: retired  Tobacco Use   Smoking status: Former    Packs/day:  2.50    Years: 35.00    Total pack years: 87.50    Types: Cigarettes    Quit date: 12/02/2002    Years since quitting: 19.5   Smokeless tobacco: Never  Vaping Use   Vaping Use: Never used  Substance and Sexual Activity   Alcohol use: Not Currently    Comment: rare   Drug use: No   Sexual activity: Not on file  Other Topics Concern   Not on file  Social History Narrative   Retired Librarian, 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    Doristine Bosworth, [employer] fan   Social Determinants of Health   Financial Resource Strain: Low Risk  (01/31/2022)   Overall Financial Resource Strain (CARDIA)    Difficulty of Paying Living Expenses: Not hard at all  Food Insecurity: No Food Insecurity (06/21/2022)   Hunger Vital Sign    Worried About Running Out of Food in the Last Year: Never true    Ran Out of Food in the Last Year: Never true  Transportation Needs: No Transportation Needs (06/21/2022)   PRAPARE - Hydrologist (Medical): No    Lack of Transportation (Non-Medical): No  Physical Activity: Unknown (01/31/2022)   Exercise Vital Sign    Days of Exercise per Week: Patient refused    Minutes of Exercise per Session: Patient refused  Stress: Stress Concern Present (01/31/2022)   [place]  Feeling of Stress : To some extent  Social Connections: Moderately Integrated (01/31/2022)   Social Connection and Isolation Panel [NHANES]    Frequency of Communication with Friends and Family: More than three times a week    Frequency of Social Gatherings with Friends and Family: Never    Attends Religious Services: More than 4 times per year    Active Member of [employer] or Organizations: No    Attends Music therapist: Never    Marital Status: Married   Family History  Problem Relation Age of Onset   Bone cancer  Mother    Pneumonia Father    Diabetes Brother    Heart disease Brother        Pacemaker   COPD Brother    Colon cancer Neg Hx    Prostate cancer Neg Hx    Esophageal cancer Neg Hx    Stomach cancer Neg Hx    Scheduled Meds:  aspirin  81 mg Per Tube Daily   atovaquone  750 mg Per Tube BID WC   brimonidine  2 drop Both Eyes BID   Chlorhexidine Gluconate Cloth  6 each Topical Daily   cilostazol  100 mg Per Tube BID   docusate  100 mg Per Tube BID   dorzolamide-timolol  1 drop Both Eyes BID   feeding supplement (PROSource TF20)  60 mL Per Tube Daily   fenofibrate  160 mg Per Tube Daily   furosemide  80 mg Intravenous BID   insulin aspart  0-9 Units Subcutaneous Q4H   ipratropium-albuterol  3 mL Nebulization QID   levothyroxine  50 mcg Per Tube QAC breakfast   methylPREDNISolone (SOLU-MEDROL) injection  125 mg Intravenous Daily   mirtazapine  7.5 mg Per Tube QHS   Netarsudil-Latanoprost  1 drop Both Eyes QHS   pantoprazole (PROTONIX) IV  40 mg Intravenous Daily   polyethylene glycol  17 g Per Tube Daily   rosuvastatin  20 mg Per Tube Daily   thiamine  100 mg Per Tube Daily   Continuous Infusions:  feeding supplement (VITAL 1.5 CAL) 50 mL/hr at 09-Jul-2022 0654   fentaNYL infusion INTRAVENOUS 100 mcg/hr (Jul 09, 2022 0654)   heparin 1,650 Units/hr (07-09-22 0654)   meropenem (MERREM) IV Stopped (06/25/22 2230)   norepinephrine (LEVOPHED) Adult infusion 6 mcg/min (July 09, 2022 0841)   propofol (DIPRIVAN) infusion 20 mcg/kg/min (07-09-22 0654)   PRN Meds:.acetaminophen **OR** acetaminophen, albuterol, fentaNYL, HYDROcodone-acetaminophen, ipratropium-albuterol, mouth rinse, mouth rinse, sodium chloride flush Allergies  Allergen Reactions   Metformin And Related     Held due to creatinine elevation September 2023.   Budesonide Rash    Drug rash with oral [place]    CBC:    Component Value Date/Time   WBC 4.8 09-Jul-2022 0509   HGB 7.6 (L) 07/09/2022 0509   HGB 9.8 (L) 05/22/2022 0934    HCT 23.9 (L) July 09, 2022 0509   PLT 112 (L) 07-09-22 0509   PLT 224 05/22/2022 0934   MCV 88.5 07/09/2022 0509   NEUTROABS 7.1 06/21/2022 0439   LYMPHSABS 0.6 (L) 06/21/2022 0439   MONOABS 0.2 06/21/2022 0439   EOSABS 0.0 06/21/2022 0439   BASOSABS 0.0 06/21/2022 0439   Comprehensive Metabolic Panel:    Component Value Date/Time   NA 131 (L) 07/09/2022 0509   NA 143 04/10/2018 1157   K 4.6 2022-07-09 0509   CL 92 (L) Jul 09, 2022 0509   CO2 26 July 09, 2022 0509   BUN 88 (H) Jul 09, 2022 0509   BUN 8 04/10/2018 1157  CREATININE 3.85 (H) 17-Jul-2022 0509   CREATININE 3.06 (HH) 05/22/2022 0934   CREATININE 0.78 05/02/2017 0000   CREATININE 0.71 10/26/2012 0903   GLUCOSE 197 (H) 17-Jul-2022 0509   CALCIUM 7.0 (L) 2022/07/17 0509   AST 38 06/22/2022 1056   AST 31 05/22/2022 0934   ALT 18 06/22/2022 1056   ALT 18 05/22/2022 0934   ALKPHOS 108 06/22/2022 1056   BILITOT 0.6 06/22/2022 1056   BILITOT 0.4 05/22/2022 0934   PROT 5.2 (L) 06/22/2022 1056   ALBUMIN <1.5 (L) 07-17-22 0509    Physical Exam: Vital Signs: BP 131/65   Pulse (!) 103   Temp 99.4 F (37.4 C) (Oral)   Resp (!) 27   Ht 5\' 10"  (1.778 m)   Wt 79.2 kg   SpO2 94%   BMI 25.05 kg/m  SpO2: SpO2: 94 % O2 Device: O2 Device: Ventilator O2 Flow Rate: O2 Flow Rate (L/min): 30 L/min Intake/output summary:  Intake/Output Summary (Last 24 hours) at 07-17-2022 1002 Last data filed at July 17, 2022 0654 Gross per 24 hour  Intake 5410 ml  Output 855 ml  Net 4555 ml   LBM: Last BM Date :  (12/1) Baseline Weight: Weight: 71.1 kg Most recent weight: Weight: 79.2 kg  General: Ill-appearing, intubated, not responsive to voice or touch Eyes: No drainage noted HENT: ETT in place  Cardiovascular: Tachycardia noted Respiratory: Intubated on ventilator support Abdomen: not distended Extremities: Warm to touch Skin: Ecchymoses on upper extremities bilaterally Neuro: Intubated, not responsive to voice or touch            Palliative Performance Scale: 10%               Additional Data Reviewed: Recent Labs    06/25/22 0407 2022-07-17 0509  WBC 5.1 4.8  HGB 8.4* 7.6*  PLT 142* 112*  NA 135 131*  BUN 91* 88*  CREATININE 3.79* 3.85*    Imaging: DG Chest Port 1 View CLINICAL DATA:  Hypoxia  EXAM: PORTABLE CHEST 1 VIEW  COMPARISON:  06/24/2022  FINDINGS: Right Port-A-Cath, endotracheal tube and NG tube remain in place, unchanged. Prior CABG. Heart is normal size. Airspace disease noted bilaterally, most pronounced in the right upper lobe and left lower lobe. Right upper lobe airspace disease has progressed since prior study. No visible effusions. No acute bony abnormality.  IMPRESSION: Patchy bilateral airspace disease most pronounced in the right upper lobe and left lower lobe, worsening in the right upper lobe since prior study.  Electronically Signed   By: Rolm Baptise M.D.   On: 06/25/2022 02:15   I personally reviewed recent imaging.   Palliative Care Assessment and Plan Summary of Established Goals of Care and Medical Treatment Preferences   Patient is a 72 year old male with a past medical history of stage IIIb squamous cell carcinoma of the right lung with satellite nodules and mediastinal adenopathy diagnosed 11/2021, hypertension, hyperlipidemia, CAD, diabetes mellitus type 2, PAD, gout who was admitted on 06/17/2022 for management of 3 months of cough and 3 weeks of progressive dyspnea and orthopnea.  Since being admitted patient has received medical therapies for acute hypoxic respiratory failure in the setting of lung cancer, drug-induced pneumonitis, and suspected pulmonary emboli; required intubation on 06/22/22.  Patient has also received therapies for ESBL E. coli UTI, acute bilateral DVT, acute on chronic diastolic heart failure, and AKI on CKD.  Nephrology following and AKI concerned to be ATN on top of his underlying GN.  Palliative care consulted to assist  with complex  medical decision making.  # Complex medical decision making/goals of care  -Unable to participate in medical decision making secondary to his mental status.  -Spoke with patient's wife, 2 sons, daughter-in-law, and sister-in-law at bedside.  Discussed transitioning to comfort focused care.  Family awaiting on other members to be present and then we will proceed with palliative extubation.  -At this time we will discontinue interventions that are no longer focused on comfort such as IV fluids, imaging, or lab work.  Will instead focus on symptom management of pain, dyspnea, and agitation in the setting of end-of-life care.  Will continue Levophed at this time, without escalation, to allow family time to present to bedside.  Will be discontinued today once family present and proceeding with palliative extubation.  - Code Status: DNR   # Symptom management    -Pain/Dyspnea, acute in the setting of end-of-life care                               -Patient currently on fentanyl infusion and propofol to manage dyspnea and agitation.  Adjusting based on patient's symptom burden.                  -Anxiety/agitation, in the setting of end-of-life care   -Patient currently on fentanyl infusion and propofol to manage dyspnea and agitation.  Adjusting based on patient's symptom burden.                               -Ordered IV Ativan 0.5 mg every 4 hours as needed. Continue to adjust based on patient's symptom burden.                                 -And also has IV Haldol 0.5 mg every 4 hours as needed. Continue to adjust based on patient's symptom burden.                   -Secretions, in the setting of end-of-life care                               -Started IV glycopyrrolate 0.2 mg every 4 hours as needed.  Should receive dose 30 minutes to 1 hour prior to palliative extubation.  # Psycho-social/Spiritual Support:  - Support System: Multiple family members present at bedside including patient's wife  #  Discharge Planning: Palliative extubation to be performed when other family members present today.  Patient will likely die in the hospital.  Thank you for allowing the palliative care team to participate in the care Jorge Adams.  Chelsea Aus, DO Palliative Care Provider PMT # [phone]  If patient remains symptomatic despite maximum doses, please call PMT at [phone] between 0700 and 1900. Outside of these hours, please call attending, as PMT does not have night coverage.

## 2022-07-22 NOTE — Progress Notes (Signed)
NAME:  Jorge Adams, MRN:  [id number], DOB:  Jan 07, 1951, LOS: 8 ADMISSION DATE:  06/10/2022, CONSULTATION DATE:  06/19/22 REFERRING MD:  Alfredia Ferguson CHIEF COMPLAINT:  respiratory failure   History of Present Illness:  Jorge Adams is a 72 year old male with Stage IIIb squamous cell carcinoma non-small cell lung cancer with large right lower lobe mass with satellite nodules and mediastinal adenopathy diagnosed in May 2023. He completed 6 cycles caboplatin/paclitaxel, last dose 02/04/22. He was delayed in starting consolidative immunotherapy due to deconditioning and radiation induced esophagitis. He was treated with steroid taper in 02/2022 for a couple of weeks. He then started durvalumab on 9/7. He was admitted to the hospital 9/20 to 9/27 for acute kidney injury and nephrotic syndrome. He had renal biopsy on 9/26 with findings of ATN and Focal Proliferative Glomerulonephritis with IgA dominant immune-complex deposition. He has been following with renal and patient's wife reports he was started on steroid treatment for the renal disease. He had second durvalumab infusion on 04/24/22.  He presented 11/28 with 3 months of cough and 3 weeks progressive dyspnea, orthopnea. +wheezing and subjective fevers. On no O2 at home. Found to have acute hypoxemic respiratory failure due to multifocal pneumonitis and presumed PE with +LE duplex.   He has been started on heparin drip for DVT and presumed PE. Echo 11/29 does not indicate right heart strain.   Pertinent  Medical History  3b NSCLC: last radiation 02/08/2022.  S/P carboplatin, paclitaxel x 6 cycles also finished in July.  He is now s/p 2 cycles of consolidation immunotherapy with imfinzi  last dose seems to have been beginning of this month.   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  Stable levo requirement.   Objective   Blood pressure 131/65, pulse (!) 103, temperature 99.5 F (37.5 C), temperature source Axillary, resp. rate (!) 27, height 5\' 10"  (1.778 m), weight 79.2 kg, SpO2 94 %.    Vent Mode: PRVC FiO2 (%):  [30 %-40 %] 40 % Set Rate:  [26 bmp] 26 bmp Vt Set:  [580 mL] 580 mL PEEP:  [8 cmH20] 8 cmH20 Plateau Pressure:  [26 cmH20-29 cmH20] 27 cmH20   Intake/Output Summary (Last 24 hours) at 2022/07/17 0829 Last data filed at Jul 17, 2022 0654 Gross per 24 hour  Intake 5410 ml  Output 855 ml  Net 4555 ml   Filed Weights   06/22/22 1155 06/23/22 0405 06/24/22 0500  Weight: 81 kg 81.5 kg 79.2 kg    Examination: General appearance: 72 y.o., male, NAD, conversant  Eyes: anicteric sclerae; PERRL, tracking appropriately HENT: NCAT; MMM Neck: Trachea midline; no lymphadenopathy, no JVD Lungs: CTAB, no crackles, no wheeze, with normal respiratory effort CV: RRR, no murmur  Abdomen: Soft, non-tender; non-distended, BS present  Extremities: No peripheral edema, warm Skin: Normal turgor and texture; no rash Psych: Appropriate affect Neuro: Alert and oriented to person and place, no focal deficit    BUN/Cr rising Albumin <1.5 PLT 112 CXR essentially stable  Resolved Hospital Problem list     Assessment & Plan:  Acute Hypoxemic Respiratory Failure: Drug induced pneumonitis vs  pneuomnia vs PJP vs ARDS from ESBL UTI, PE Drug induced Pneumonitis Suspected Pulmonary Emboli Stage IIIB non-small cell lung cancer - Continue mechanical ventilation, daily SAT/SBT as able  - Continue meropenem as below - Continue atovaquone for empiric pneumocystis treatment. LDH elevated. Follow up fungitell. Follow up pneumocystis PCR  from 12/2 tracheal aspirate (pending) - Continue solumedrol for concern of pneumonitis   ESBL E Coli UTI - 7 days merrem should cover any bacterial component to PNA and UTI  Hypotension from sedation - levophed for MAP 65 or greater  Acute Bilateral DVT - continue heparin drip per pharmacy  AKI on CKD IIIb-IV Focal Proliferative Glomerulonephritis with IgA dominant immune-complex deposition Metabolic Acidosis - Nephrology following - will discuss with family whether they would like to pursue time-limited trial of RRT  - Continue trial of diuresis, not quite oliguric and can hopefully dodge RRT though clearance continues to worsen - Lokelma PRN hyperkalemia  Acute on Chronic Diastolic Heart Failure Hypertension Coronary Artery Disease PAD Type II NSTEMI - hold bidil, metoprolol due to hypotension  - Continue aspirin 81 mg daily, cilostazol 100mg  daily, and rosuvastatin 20mg  daily  Diabetes Mellitus II with vascular disease - SSI  Severe protein calorie malnutrition - tube feeds  Urinary Retention - continue foley  GOC- PMT consulted.  Discussed with pt's wife regarding his concerning trajectory with poor performance status prior to hospitalization, respiratory failure, worsening kidney failure, severe protein calorie malnutrition. If RRT pursued, may take a little while to see improvement from respiratory standpoint to extent we could extubate and then would foresee a long and rocky road to recovery with significant loss of independence for at least months. She is not sure he'd want to go through all that.   Best Practice (right click and "Reselect all SmartList Selections" daily)   Diet/type: tubefeeds DVT prophylaxis: systemic heparin GI prophylaxis: N/A Lines: N/A Foley:  Yes, and it is still needed Code Status:  DNR - discussed with spouse today Last date of multidisciplinary goals of care discussion [12/6]  Critical care time: 47 minutes    [facility] for personal pager PCCM on call pager [phone] until 7pm. Please call Elink 7p-7a. [phone]

## 2022-07-22 NOTE — Progress Notes (Signed)
RT NOTE:  Pt extubated to comfort care measures per MD order and family request.

## 2022-07-22 NOTE — Progress Notes (Signed)
Chaplain engaged in a follow-up visit with Jorge Adams and his dear wife.  Chaplain had previously been able to meet them when Jorge Adams was on the sixth floor.  Chaplain offered listening, presence, and prayer with Jorge Adams.    She was able to talk about their love story, Soua' healthcare journey, and honoring what Jorge Adams wants now.  They have been married almost twelve years.  Jorge Adams described Jorge Adams as being caring, kind, chivalrous, and a romantic.  Chaplain affirmed their love and upheld it in the decisions they have had to make concerning Connar' health.   Chaplain offered support and will check in later today.     Jul 07, 2022 1000  Clinical Encounter Type  Visited With Patient and family together  Visit Type Spiritual support;Follow-up  Spiritual Encounters  Spiritual Needs Prayer;Emotional;Grief support

## 2022-07-22 NOTE — Progress Notes (Signed)
I reviewed Jorge Adams's chart and see that he is being extubated to comfort care.  Stopped by but family was gathered and the process was underway.  I didn't interrupt.  I will let Dr. Hollie Salk his nephrologist know the unfortunate news.  Call if I can assist.   Jannifer Hick MD [place] Pager [phone]

## 2022-07-22 NOTE — Discharge Summary (Signed)
DEATH SUMMARY   Patient Details  Name: Jorge Adams MRN: [id number] DOB: 02-28-1951  Admission/Discharge Information   Admit Date:  26-Jun-2022  Date of Death: Date of Death: 2022-07-04  Time of Death: Time of Death: October 06, 1512  Length of Stay: 8  Referring Physician: [name], MD   Reason(s) for Hospitalization  Acute hypoxic respiratory failure ARDS due to pneumocystis pneumonia Acute renal failure  Diagnoses  Preliminary cause of death:  Secondary Diagnoses (including complications and co-morbidities):  Principal Problem:   CAP (community acquired pneumonia) Active Problems:   Hypertension associated with diabetes ([place])   Hyperlipidemia LDL goal <70   Type 2 diabetes mellitus with vascular disease ([place])   PAD (peripheral artery disease) ([place])   CAD (coronary artery disease)   Stage III squamous cell carcinoma of right lung (HCC)   Normocytic anemia   Hypomagnesemia   Hypokalemia   CKD (chronic kidney disease), stage IV (HCC)   Chronic diastolic CHF (congestive heart failure) (HCC)   Acute respiratory failure with hypoxia (HCC)   Elevated troponin   UTI (urinary tract infection)   Nonspecific abnormal electrocardiogram (ECG) (EKG)   CKD (chronic kidney disease) stage 4, GFR 15-29 ml/min (HCC)   Abnormal cardiac enzyme level   Need for emotional support   Pain   Dyspnea   Agitation   High risk medication use   Counseling and coordination of care   Acute renal failure [facility])   Multifocal pneumonia   Palliative care encounter   Brief Hospital Course (including significant findings, care, treatment, and services provided and events leading to death)  Jorge Adams is a 72 y.o. year old male who was admitted 27-Jun-2023 with subacute cough and dyspnea found to have ARDS due to PJP pneumonia, ESBL UTI. His course was complicated by prolonged mechanical ventilation, AKI on CKD, acute DVT. As it became clear that his recovery would be prolonged, uncertain decision  made to transition to comfort measures only. He was pronounced dead in the company of his family 12/6/    Pertinent Labs and Studies  Significant Diagnostic Studies DG Chest Port 1 View  Result Date: 06/25/2022 CLINICAL DATA:  Hypoxia EXAM: PORTABLE CHEST 1 VIEW COMPARISON:  06/24/2022 FINDINGS: Right Port-A-Cath, endotracheal tube and NG tube remain in place, unchanged. Prior CABG. Heart is normal size. Airspace disease noted bilaterally, most pronounced in the right upper lobe and left lower lobe. Right upper lobe airspace disease has progressed since prior study. No visible effusions. No acute bony abnormality. IMPRESSION: Patchy bilateral airspace disease most pronounced in the right upper lobe and left lower lobe, worsening in the right upper lobe since prior study. Electronically Signed   By: Rolm Baptise M.D.   On: 06/25/2022 02:15   US RENAL  Result Date: 06/24/2022 CLINICAL DATA:  Acute kidney injury EXAM: RENAL / URINARY TRACT ULTRASOUND COMPLETE COMPARISON:  04/11/2022 FINDINGS: Evaluation is somewhat limited by patient inability to hold breath or to move. Right Kidney: Renal measurements: 12.5 x 6.6 x 6.8 cm = volume: 294 mL. Echogenicity within normal limits. No mass or hydronephrosis visualized. Left Kidney: Renal measurements: 13.0 x 5.8 x 5.1 cm = volume: 203 mL. Echogenicity within normal limits. No mass or hydronephrosis visualized. Bladder: A Foley is noted within the bladder. Other: None. IMPRESSION: No significant sonographic abnormality of the kidneys. Electronically Signed   By: Merilyn Baba M.D.   On: 06/24/2022 18:51   DG CHEST PORT 1 VIEW  Result Date: 06/24/2022 CLINICAL DATA:  Hypoxia  EXAM: PORTABLE CHEST 1 VIEW COMPARISON:  Previous studies including the examination of 06/22/2022 FINDINGS: Transverse diameter of heart is increased. There is previous coronary bypass surgery. Tip of endotracheal tube is 6.7 cm above the carina. Tip of right IJ chest port is seen in  superior vena cava. Enteric tube is noted traversing the esophagus. There is interval decrease in interstitial densities in left lung. Still, there is residual prominence of interstitial markings in left lung, more so in the left lower lung field. Increased markings in right upper lung fields have not changed significantly. No new focal infiltrates are noted. Lateral CP angles are clear. There is no pneumothorax. IMPRESSION: There is interval improvement in aeration of left lung suggesting resolving pulmonary edema. There is residual prominence of interstitial markings in both lungs, more so in the right upper lung field. This finding may suggest residual asymmetric pulmonary edema or underlying pneumonia. There are no new focal infiltrates. Electronically Signed   By: Elmer Picker M.D.   On: 06/24/2022 15:30   DG CHEST PORT 1 VIEW  Result Date: 06/22/2022 CLINICAL DATA:  Endotracheal and orogastric tube EXAM: PORTABLE CHEST 1 VIEW COMPARISON:  Chest x-ray 06/22/2022 FINDINGS: There is a new endotracheal tube with distal tip 8 mm above the carina projecting to the right. The cardiomediastinal silhouette is within normal limits. Sternotomy wires are present. Enteric tube extends below the diaphragm. Right chest port catheter tip projects over the distal SVC. Diffuse interstitial and hazy opacities are seen throughout the entire left lung and in the right upper lobe. Opacities have increased on the left. There is no pleural effusion or pneumothorax. No acute osseous findings. IMPRESSION: 1. New endotracheal tube with distal tip 8 mm above the carina projecting to the right. Recommend retraction by 1 cm. 2. Diffuse interstitial and hazy opacities throughout the entire left lung and in the right upper lobe. Opacities have increased on the left. Findings may represent edema and/or infection. Electronically Signed   By: Ronney Asters M.D.   On: 06/22/2022 19:03   DG Abd 1 View  Result Date:  06/22/2022 CLINICAL DATA:  Evaluate OG tube EXAM: ABDOMEN - 1 VIEW COMPARISON:  None Available. FINDINGS: The OG tube terminates in the stomach. IMPRESSION: The OG tube terminates in the stomach. Electronically Signed   By: Dorise Bullion III M.D.   On: 06/22/2022 17:54   DG CHEST PORT 1 VIEW  Result Date: 06/22/2022 CLINICAL DATA:  Respiratory failure.  Known lung cancer. EXAM: PORTABLE CHEST 1 VIEW COMPARISON:  June 21, 2022 FINDINGS: The heart size and mediastinal contours are stable. Right central venous line is unchanged. Diffuse increased interstitial and alveolar opacities are identified throughout bilateral lungs unchanged. Probable minimal right pleural effusion noted. The visualized skeletal structures are stable. IMPRESSION: Diffuse interstitial and alveolar opacities in bilateral lungs without interval change. Electronically Signed   By: Abelardo Diesel M.D.   On: 06/22/2022 10:11   DG CHEST PORT 1 VIEW  Result Date: 06/21/2022 CLINICAL DATA:  SOB EXAM: PORTABLE CHEST 1 VIEW COMPARISON:  06/20/2022 FINDINGS: Diffuse alveolar and interstitial opacities identified with sparing of the right lower lung. Small left-sided pleural effusion. Calcified aorta. No pneumothorax identified. Right-sided Port-A-Cath tip distal SVC. Median sternotomy wires and CABG. IMPRESSION: Diffuse interstitial and alveolar opacities without definite interval change Electronically Signed   By: Sammie Bench M.D.   On: 06/21/2022 08:06   DG CHEST PORT 1 VIEW  Result Date: 06/20/2022 CLINICAL DATA:  72 year old male presenting with shortness  of breath. EXAM: PORTABLE CHEST 1 VIEW COMPARISON:  June 18, 2022. FINDINGS: EKG leads project over the chest. RIGHT-sided Port-A-Cath terminates at the caval to atrial junction as before. Changes of median sternotomy for CABG redemonstrated. Cardiomediastinal contours and hilar structures are stable. Diffuse interstitial and alveolar opacity throughout the RIGHT and LEFT  chest with moderate increased since previous imaging. Suggestion of developing Peri fissural consolidative changes along the minor fissure in the RIGHT chest and at the RIGHT lung base with potential pleural fluid. IMPRESSION: 1. Diffuse interstitial and alveolar opacity throughout the RIGHT and LEFT chest with moderate increased since previous imaging. A baseline there is some interstitial prominence and ground-glass that was seen on previous imaging now more diffuse. Superimposed edema, infection of for pneumonitis are considered. 2. Potential slight interval enlargement of pleural fluid in the RIGHT chest since previous imaging. Electronically Signed   By: Zetta Bills M.D.   On: 06/20/2022 08:14   VAS [place] LOWER EXTREMITY VENOUS (DVT)  Result Date: 06/19/2022  Lower Venous DVT Study Patient Name:  Jorge Adams  Date of Exam:   06/19/2022 Medical Rec #: [id number]           Accession #:    [id number] Date of Birth: [birth year]          Patient Gender: M Patient Age:   80 years Exam Location:  [facility] Procedure:      VAS [place] LOWER EXTREMITY VENOUS (DVT) Referring Phys: Adrian Prows --------------------------------------------------------------------------------  Indications: Swelling.  Risk Factors: Cancer. Limitations: Poor ultrasound/tissue interface. Comparison Study: No prior studies. Performing Technologist: Oliver Hum RVT  Examination Guidelines: A complete evaluation includes B-mode imaging, spectral Doppler, color Doppler, and power Doppler as needed of all accessible portions of each vessel. Bilateral testing is considered an integral part of a complete examination. Limited examinations for reoccurring indications may be performed as noted. The reflux portion of the exam is performed with the patient in reverse Trendelenburg.  +---------+---------------+---------+-----------+----------+--------------+ RIGHT    CompressibilityPhasicitySpontaneityPropertiesThrombus Aging  +---------+---------------+---------+-----------+----------+--------------+ CFV      Full           Yes      Yes                                 +---------+---------------+---------+-----------+----------+--------------+ SFJ      Full                                                        +---------+---------------+---------+-----------+----------+--------------+ FV Prox  Full                                                        +---------+---------------+---------+-----------+----------+--------------+ FV Mid   Full           Yes      Yes                                 +---------+---------------+---------+-----------+----------+--------------+ FV DistalFull                                                        +---------+---------------+---------+-----------+----------+--------------+  PFV      Full                                                        +---------+---------------+---------+-----------+----------+--------------+ POP      Full           Yes      Yes                                 +---------+---------------+---------+-----------+----------+--------------+ PTV      Full                                                        +---------+---------------+---------+-----------+----------+--------------+ PERO     Partial                                      Acute          +---------+---------------+---------+-----------+----------+--------------+   +---------+---------------+---------+-----------+----------+--------------+ LEFT     CompressibilityPhasicitySpontaneityPropertiesThrombus Aging +---------+---------------+---------+-----------+----------+--------------+ CFV      Full           Yes      Yes                                 +---------+---------------+---------+-----------+----------+--------------+ SFJ      Full                                                         +---------+---------------+---------+-----------+----------+--------------+ FV Prox  Full                                                        +---------+---------------+---------+-----------+----------+--------------+ FV Mid   Full           Yes      Yes                                 +---------+---------------+---------+-----------+----------+--------------+ FV DistalFull           Yes      Yes                                 +---------+---------------+---------+-----------+----------+--------------+ PFV      Full                                                        +---------+---------------+---------+-----------+----------+--------------+ POP  None           No       No                                  +---------+---------------+---------+-----------+----------+--------------+ PTV      Full                                                        +---------+---------------+---------+-----------+----------+--------------+ PERO     Full                                                        +---------+---------------+---------+-----------+----------+--------------+     Summary: RIGHT: - Findings consistent with acute deep vein thrombosis involving the right peroneal veins. - No cystic structure found in the popliteal fossa.  LEFT: - Findings consistent with acute deep vein thrombosis involving the left popliteal vein. - No cystic structure found in the popliteal fossa.  *See table(s) above for measurements and observations. Electronically signed by Harold Barban MD on 06/19/2022 at 7:45:56 PM.    Final    ECHOCARDIOGRAM COMPLETE  Result Date: 06/19/2022    ECHOCARDIOGRAM REPORT   Patient Name:   Jorge Adams Date of Exam: 06/19/2022 Medical Rec #:  [id number]          Height:       70.0 in Accession #:    [id number]         Weight:       156.7 lb Date of Birth:  1950-09-19         BSA:          1.882 m Patient Age:    20 years           BP:            143/76 mmHg Patient Gender: M                  HR:           111 bpm. Exam Location:  Inpatient Procedure: 2D Echo and Intracardiac Opacification Agent Indications:     elevated troponins  History:         Patient has no prior history of Echocardiogram examinations,                  most recent 02/24/2022. CAD, PAD; Risk Factors:Hypertension and                  Dyslipidemia.  Sonographer:     Harvie Junior Referring Phys:  [place] Diagnosing Phys: [place] IMPRESSIONS  1. Wall motion challenging to assess with tachycardia and challenging windows. Left ventricular ejection fraction, by estimation, is 55 to 60%. The left ventricle has normal function. The left ventricle has no regional wall motion abnormalities. There is mild left ventricular hypertrophy. Left ventricular diastolic parameters are indeterminate.  2. Right ventricular systolic function is normal. The right ventricular size is normal.  3. No evidence of mitral valve regurgitation.  4. Aortic valve regurgitation is not visualized. Aortic valve sclerosis/calcification is present, without any evidence  of aortic stenosis.  5. The inferior vena cava is normal in size with greater than 50% respiratory variability, suggesting right atrial pressure of 3 mmHg. Comparison(s): No significant change from prior study. FINDINGS  Left Ventricle: Wall motion challenging to assess with tachycardia and challenging windows. Left ventricular ejection fraction, by estimation, is 55 to 60%. The left ventricle has normal function. The left ventricle has no regional wall motion abnormalities. Definity contrast agent was given IV to delineate the left ventricular endocardial borders. The left ventricular internal cavity size was normal in size. There is mild left ventricular hypertrophy. Abnormal (paradoxical) septal motion consistent with post-operative status. Left ventricular diastolic parameters are indeterminate. Right Ventricle: The right ventricular  size is normal. Right ventricular systolic function is normal. Left Atrium: Left atrial size was normal in size. Right Atrium: Right atrial size was normal in size. Pericardium: There is no evidence of pericardial effusion. Mitral Valve: No evidence of mitral valve regurgitation. Tricuspid Valve: Tricuspid valve regurgitation is mild. Aortic Valve: Aortic valve regurgitation is not visualized. Aortic valve sclerosis/calcification is present, without any evidence of aortic stenosis. Aortic valve mean gradient measures 4.0 mmHg. Aortic valve peak gradient measures 7.1 mmHg. Aortic valve  area, by VTI measures 2.47 cm. Pulmonic Valve: Pulmonic valve regurgitation is not visualized. Venous: The inferior vena cava is normal in size with greater than 50% respiratory variability, suggesting right atrial pressure of 3 mmHg. IAS/Shunts: The interatrial septum was not well visualized.  LEFT VENTRICLE PLAX 2D LVIDd:         4.20 cm     Diastology LVIDs:         2.70 cm     LV e' medial:    12.10 cm/s LV PW:         0.90 cm     LV E/e' medial:  6.6 LV IVS:        1.00 cm     LV e' lateral:   10.90 cm/s LVOT diam:     2.20 cm     LV E/e' lateral: 7.3 LV SV:         47 LV SV Index:   25 LVOT Area:     3.80 cm  LV Volumes (MOD) LV vol d, MOD A2C: 97.0 ml LV vol d, MOD A4C: 91.8 ml LV vol s, MOD A2C: 40.7 ml LV vol s, MOD A4C: 44.3 ml LV SV MOD A2C:     56.3 ml LV SV MOD A4C:     91.8 ml LV SV MOD BP:      52.8 ml RIGHT VENTRICLE RV Basal diam:  4.20 cm RV Mid diam:    3.30 cm RV S prime:     12.60 cm/s TAPSE (M-mode): 1.9 cm LEFT ATRIUM             Index        RIGHT ATRIUM           Index LA Vol (A2C):   29.0 ml 15.41 ml/m  RA Area:     15.60 cm LA Vol (A4C):   30.6 ml 16.26 ml/m  RA Volume:   35.30 ml  18.76 ml/m LA Biplane Vol: 32.3 ml 17.16 ml/m  AORTIC VALVE                    PULMONIC VALVE AV Area (Vmax):    3.00 cm     PV Vmax:       0.88 m/s AV Area (Vmean):  2.73 cm     PV Peak grad:  3.1 mmHg AV Area (VTI):      2.47 cm AV Vmax:           133.00 cm/s AV Vmean:          89.600 cm/s AV VTI:            0.191 m AV Peak Grad:      7.1 mmHg AV Mean Grad:      4.0 mmHg LVOT Vmax:         105.00 cm/s LVOT Vmean:        64.300 cm/s LVOT VTI:          0.124 m LVOT/AV VTI ratio: 0.65  AORTA Ao Root diam: 3.70 cm Ao Asc diam:  3.40 cm MITRAL VALVE                TRICUSPID VALVE MV Area (PHT): 5.06 cm     TR Peak grad:   42.0 mmHg MV Decel Time: 150 msec     TR Vmax:        324.00 cm/s MV E velocity: 80.10 cm/s MV A velocity: 114.00 cm/s  SHUNTS MV E/A ratio:  0.70         Systemic VTI:  0.12 m                             Systemic Diam: 2.20 cm Phineas Inches Electronically signed by Phineas Inches Signature Date/Time: 06/19/2022/12:17:33 PM    Final (Updated)    ECHOCARDIOGRAM COMPLETE  Result Date: 06/19/2022    ECHOCARDIOGRAM REPORT   Patient Name:   Jorge Adams Date of Exam: 06/19/2022 Medical Rec #:  [id number]          Height:       70.0 in Accession #:    [id number]         Weight:       156.7 lb Date of Birth:  [birth year]         BSA:          1.882 m Patient Age:    24 years           BP:           143/76 mmHg Patient Gender: M                  HR:           111 bpm. Exam Location:  Inpatient Procedure: 2D Echo and Intracardiac Opacification Agent Indications:    elevated troponins  History:        Patient has no prior history of Echocardiogram examinations,                 most recent 02/24/2022. CAD, PAD; Risk Factors:Hypertension and                 Dyslipidemia.  Sonographer:    Harvie Junior Referring Phys: [place]  1. Wall motion challenging to assess with tachycardia and challenging windows. Left ventricular ejection fraction, by estimation, is 55 to 60%. The left ventricle has normal function. The left ventricle has no regional wall motion abnormalities. There is mild left ventricular hypertrophy. Left ventricular diastolic parameters are indeterminate.  2. Right ventricular systolic  function is normal. The right ventricular size is normal.  3. No evidence of mitral valve regurgitation.  4. Aortic valve regurgitation is not visualized. Aortic  valve sclerosis/calcification is present, without any evidence of aortic stenosis.  5. The inferior vena cava is normal in size with greater than 50% respiratory variability, suggesting right atrial pressure of 3 mmHg. Comparison(s): No significant change from prior study. FINDINGS  Left Ventricle: Wall motion challenging to assess with tachycardia and challenging windows. Left ventricular ejection fraction, by estimation, is 55 to 60%. The left ventricle has normal function. The left ventricle has no regional wall motion abnormalities. Definity contrast agent was given IV to delineate the left ventricular endocardial borders. The left ventricular internal cavity size was normal in size. There is mild left ventricular hypertrophy. Abnormal (paradoxical) septal motion consistent with post-operative status. Left ventricular diastolic parameters are indeterminate. Right Ventricle: The right ventricular size is normal. Right ventricular systolic function is normal. Left Atrium: Left atrial size was normal in size. Right Atrium: Right atrial size was normal in size. Pericardium: There is no evidence of pericardial effusion. Mitral Valve: No evidence of mitral valve regurgitation. Tricuspid Valve: Tricuspid valve regurgitation is mild. Aortic Valve: Aortic valve regurgitation is not visualized. Aortic valve sclerosis/calcification is present, without any evidence of aortic stenosis. Aortic valve mean gradient measures 4.0 mmHg. Aortic valve peak gradient measures 7.1 mmHg. Aortic valve  area, by VTI measures 2.47 cm. Pulmonic Valve: Pulmonic valve regurgitation is not visualized. Venous: The inferior vena cava is normal in size with greater than 50% respiratory variability, suggesting right atrial pressure of 3 mmHg. IAS/Shunts: The interatrial septum was not  well visualized.  LEFT VENTRICLE PLAX 2D LVIDd:         4.20 cm     Diastology LVIDs:         2.70 cm     LV e' medial:    12.10 cm/s LV PW:         0.90 cm     LV E/e' medial:  6.6 LV IVS:        1.00 cm     LV e' lateral:   10.90 cm/s LVOT diam:     2.20 cm     LV E/e' lateral: 7.3 LV SV:         47 LV SV Index:   25 LVOT Area:     3.80 cm  LV Volumes (MOD) LV vol d, MOD A2C: 97.0 ml LV vol d, MOD A4C: 91.8 ml LV vol s, MOD A2C: 40.7 ml LV vol s, MOD A4C: 44.3 ml LV SV MOD A2C:     56.3 ml LV SV MOD A4C:     91.8 ml LV SV MOD BP:      52.8 ml RIGHT VENTRICLE RV Basal diam:  4.20 cm RV Mid diam:    3.30 cm RV S prime:     12.60 cm/s TAPSE (M-mode): 1.9 cm LEFT ATRIUM             Index        RIGHT ATRIUM           Index LA Vol (A2C):   29.0 ml 15.41 ml/m  RA Area:     15.60 cm LA Vol (A4C):   30.6 ml 16.26 ml/m  RA Volume:   35.30 ml  18.76 ml/m LA Biplane Vol: 32.3 ml 17.16 ml/m  AORTIC VALVE                    PULMONIC VALVE AV Area (Vmax):    3.00 cm     PV Vmax:  0.88 m/s AV Area (Vmean):   2.73 cm     PV Peak grad:  3.1 mmHg AV Area (VTI):     2.47 cm AV Vmax:           133.00 cm/s AV Vmean:          89.600 cm/s AV VTI:            0.191 m AV Peak Grad:      7.1 mmHg AV Mean Grad:      4.0 mmHg LVOT Vmax:         105.00 cm/s LVOT Vmean:        64.300 cm/s LVOT VTI:          0.124 m LVOT/AV VTI ratio: 0.65  AORTA Ao Root diam: 3.70 cm Ao Asc diam:  3.40 cm MITRAL VALVE                TRICUSPID VALVE MV Area (PHT): 5.06 cm     TR Peak grad:   42.0 mmHg MV Decel Time: 150 msec     TR Vmax:        324.00 cm/s MV E velocity: 80.10 cm/s MV A velocity: 114.00 cm/s  SHUNTS MV E/A ratio:  0.70         Systemic VTI:  0.12 m                             Systemic Diam: 2.20 cm Phineas Inches Electronically signed by Phineas Inches Signature Date/Time: 06/19/2022/12:17:33 PM    Final    DG Chest Portable 1 View  Result Date: 05/26/2022 CLINICAL DATA:  Per chart: Patient has shortness of breath and feeling weak  where he cannot walk the past 2 days. He received a CT scan yesterday that showed an infection. Started doxycycline. Patient has lung cancer stage 3, currently getting chemo. EXAM: PORTABLE CHEST - 1 VIEW COMPARISON:  05/20/2022 FINDINGS: Stable right IJ port to the proximal right atrium. Worsening coarse interstitial throughout both lungs, with some coarse airspace opacities throughout the right upper lung. Heart size and mediastinal contours are within normal limits. CABG markers. Blunting of the right lateral costophrenic angle suggesting small effusion. Sternotomy wires.  Cervical fixation hardware partially visualized. IMPRESSION: 1. Worsening coarse interstitial and right upper lung airspace opacities. 2. Small right effusion. Electronically Signed   By: Lucrezia Europe M.D.   On: 06/15/2022 19:33   CT Chest Wo Contrast  Result Date: 06/17/2022 CLINICAL DATA:  Non-small cell lung cancer, assess treatment response. Ongoing immunotherapy. Chronic cough. Shortness of breath. * Tracking Code: BO * EXAM: CT CHEST WITHOUT CONTRAST TECHNIQUE: Multidetector CT imaging of the chest was performed following the standard protocol without IV contrast. RADIATION DOSE REDUCTION: This exam was performed according to the departmental dose-optimization program which includes automated exposure control, adjustment of the mA and/or kV according to patient size and/or use of iterative reconstruction technique. COMPARISON:  02/22/2022. FINDINGS: Cardiovascular: Right IJ Port-A-Cath terminates in the high right atrium. Atherosclerotic calcification of the aorta and aortic valve. Enlarged pulmonic trunk. Heart is at the upper limits of normal in size. No pericardial effusion. Mediastinum/Nodes: 1.4 cm low right paratracheal lymph node, increased from 9 mm. Additional mediastinal lymph nodes are not enlarged by CT size criteria. Hilar regions are difficult to evaluate without IV contrast. No axillary adenopathy. Esophagus is grossly  unremarkable. Lungs/Pleura: Centrilobular and paraseptal emphysema. Thick-walled cavitary mass in the right lower  lobe has decreased in size, now measuring 2.6 x 3.9 cm (5/74), previously approximately 5.3 x 7.5 cm. Interstitial thickening and nodularity in the right middle and right lower lobes have improved in the interval as well. New patchy ground-glass in the upper lobes and left lower lobe, with scattered consolidation in the right upper lobe, new. Cylindrical bronchiectasis. Small right effusion, increased. No left pleural fluid. Airway is otherwise unremarkable. Upper Abdomen: Visualized portion of the liver is grossly unremarkable. Stones in the gallbladder. Visualized portions of the adrenal glands and kidneys are unremarkable. Spleen measures at the upper limits of normal, 12.9 cm. Visualized portions of the pancreas, stomach and bowel are grossly unremarkable. Atherosclerotic calcification of the aorta. Renal vascular calcifications. No pathologically enlarged lymph nodes. Musculoskeletal: Degenerative changes in the spine. No worrisome lytic or sclerotic lesions. IMPRESSION: 1. Interval response to therapy as evidenced by decrease in size of a cavitary mass in the right lower lobe. 2. Interval improvement in interstitial thickening and peribronchovascular nodularity in the right middle and right lower lobes. 3. New patchy ground-glass in the upper lobes and left lower lobe with associated areas of consolidation in the right upper lobe. Findings are likely infectious/inflammatory in etiology given the relatively short interval from 02/22/2022. 4. Enlarging low right paratracheal lymph node, possibly reactive. Recommend attention on follow-up. 5. Small right pleural effusion, increased. 6. Cylindrical bronchiectasis. 7. Cholelithiasis. 8.  Aortic atherosclerosis (ICD10-I70.0). 9. Enlarged pulmonic trunk, indicative of pulmonary arterial hypertension. 10.  Emphysema (ICD10-J43.9). Electronically Signed    By: Lorin Picket M.D.   On: 06/17/2022 09:04    Microbiology Recent Results (from the past 240 hour(s))  Resp Panel by RT-PCR (Flu A&B, Covid) Porta Cath     Status: None   Collection Time: 06/04/2022  5:44 PM   Specimen: Porta Cath; Nasal Swab  Result Value Ref Range Status   SARS Coronavirus 2 by RT PCR NEGATIVE NEGATIVE Final    Comment: (NOTE) SARS-CoV-2 target nucleic acids are NOT DETECTED.  The SARS-CoV-2 RNA is generally detectable in upper respiratory specimens during the acute phase of infection. The lowest concentration of SARS-CoV-2 viral copies this assay can detect is 138 copies/mL. A negative result does not preclude SARS-Cov-2 infection and should not be used as the sole basis for treatment or other patient management decisions. A negative result may occur with  improper specimen collection/handling, submission of specimen other than nasopharyngeal swab, presence of viral mutation(s) within the areas targeted by this assay, and inadequate number of viral copies(<138 copies/mL). A negative result must be combined with clinical observations, patient history, and epidemiological information. The expected result is Negative.  Fact Sheet for Patients:  EntrepreneurPulse.com.au  Fact Sheet for Healthcare Providers:  IncredibleEmployment.be  This test is no t yet approved or cleared by the [place] FDA and  has been authorized for detection and/or diagnosis of SARS-CoV-2 by FDA under an Emergency Use Authorization (EUA). This EUA will remain  in effect (meaning this test can be used) for the duration of the COVID-19 declaration under Section 564(b)(1) of the Act, 21 U.S.C.section 360bbb-3(b)(1), unless the authorization is terminated  or revoked sooner.       Influenza A by PCR NEGATIVE NEGATIVE Final   Influenza B by PCR NEGATIVE NEGATIVE Final    Comment: (NOTE) The Xpert Xpress SARS-CoV-2/FLU/RSV plus assay is intended  as an aid in the diagnosis of influenza from Nasopharyngeal swab specimens and should not be used as a sole basis for treatment. Nasal washings and  aspirates are unacceptable for Xpert Xpress SARS-CoV-2/FLU/RSV testing.  Fact Sheet for Patients: EntrepreneurPulse.com.au  Fact Sheet for Healthcare Providers: IncredibleEmployment.be  This test is not yet approved or cleared by the [place] FDA and has been authorized for detection and/or diagnosis of SARS-CoV-2 by FDA under an Emergency Use Authorization (EUA). This EUA will remain in effect (meaning this test can be used) for the duration of the COVID-19 declaration under Section 564(b)(1) of the Act, 21 U.S.C. section 360bbb-3(b)(1), unless the authorization is terminated or revoked.  Performed at [facility], [place] [address] [place], [place] [address]   Respiratory (~20 pathogens) panel by PCR     Status: None   Collection Time: 05/28/2022  5:44 PM   Specimen: Nasopharyngeal Swab; Respiratory  Result Value Ref Range Status   Adenovirus NOT DETECTED NOT DETECTED Final   Coronavirus 229E NOT DETECTED NOT DETECTED Final    Comment: (NOTE) The Coronavirus on the Respiratory Panel, DOES NOT test for the novel  Coronavirus (2019 nCoV)    Coronavirus HKU1 NOT DETECTED NOT DETECTED Final   Coronavirus NL63 NOT DETECTED NOT DETECTED Final   Coronavirus OC43 NOT DETECTED NOT DETECTED Final   Metapneumovirus NOT DETECTED NOT DETECTED Final   Rhinovirus / Enterovirus NOT DETECTED NOT DETECTED Final   Influenza A NOT DETECTED NOT DETECTED Final   Influenza B NOT DETECTED NOT DETECTED Final   Parainfluenza Virus 1 NOT DETECTED NOT DETECTED Final   Parainfluenza Virus 2 NOT DETECTED NOT DETECTED Final   Parainfluenza Virus 3 NOT DETECTED NOT DETECTED Final   Parainfluenza Virus 4 NOT DETECTED NOT DETECTED Final   Respiratory Syncytial Virus NOT DETECTED NOT DETECTED Final    Bordetella pertussis NOT DETECTED NOT DETECTED Final   Bordetella Parapertussis NOT DETECTED NOT DETECTED Final   Chlamydophila pneumoniae NOT DETECTED NOT DETECTED Final   Mycoplasma pneumoniae NOT DETECTED NOT DETECTED Final    Comment: Performed at [facility], [place]. [address], [place], [place] [address]  Blood culture (routine x 2)     Status: None   Collection Time: 05/24/2022  5:46 PM   Specimen: BLOOD  Result Value Ref Range Status   Specimen Description   Final    BLOOD RIGHT CHEST Performed at [place] [address] [place] [address], [place], [place] [address]    Special Requests   Final    BOTTLES DRAWN AEROBIC AND ANAEROBIC Blood Culture adequate volume Performed at [place] [address] [place], [place] [address]    Culture   Final    NO GROWTH 5 DAYS Performed at [facility], [place] [address], [place], [place] [address]    Report Status 06/23/2022 FINAL  Final  Urine Culture     Status: Abnormal   Collection Time: 05/22/2022  8:53 PM   Specimen: Urine, Clean Catch  Result Value Ref Range Status   Specimen Description   Final    URINE, CLEAN CATCH Performed at [facility] [place] [facility], [place] [address] [place], [place] [address]    Special Requests   Final    NONE Performed at [facility], [place] [address], [place],  [address]    Culture (A)  Final    40,000 COLONIES/mL ESCHERICHIA COLI Confirmed Extended Spectrum Beta-Lactamase Producer (ESBL).  In bloodstream infections from ESBL organisms, carbapenems are preferred over piperacillin/tazobactam. They are shown to have a lower risk of mortality.    Report Status 06/21/2022 FINAL  Final  Organism ID, Bacteria ESCHERICHIA COLI (A)  Final      Susceptibility   Escherichia coli - MIC*    AMPICILLIN >=32 RESISTANT Resistant     CEFAZOLIN >=64 RESISTANT Resistant     CEFEPIME 4 INTERMEDIATE Intermediate     CEFTRIAXONE >=64 RESISTANT  Resistant     CIPROFLOXACIN >=4 RESISTANT Resistant     GENTAMICIN >=16 RESISTANT Resistant     IMIPENEM <=0.25 SENSITIVE Sensitive     NITROFURANTOIN <=16 SENSITIVE Sensitive     TRIMETH/SULFA <=20 SENSITIVE Sensitive     AMPICILLIN/SULBACTAM >=32 RESISTANT Resistant     PIP/TAZO <=4 SENSITIVE Sensitive     * 40,000 COLONIES/mL ESCHERICHIA COLI  MRSA Next Gen by PCR, Nasal     Status: None   Collection Time: 06/15/2022  9:11 PM   Specimen: Nasal Mucosa; Nasal Swab  Result Value Ref Range Status   MRSA by PCR Next Gen NOT DETECTED NOT DETECTED Final    Comment: (NOTE) The GeneXpert MRSA Assay (FDA approved for NASAL specimens only), is one component of a comprehensive MRSA colonization surveillance program. It is not intended to diagnose MRSA infection nor to guide or monitor treatment for MRSA infections. Test performance is not FDA approved in patients less than 73 years old. Performed at [facility], [place] [address], [place], [place] [address]   MRSA Next Gen by PCR, Nasal     Status: None   Collection Time: 06/19/22  8:39 PM   Specimen: Nasal Mucosa; Nasal Swab  Result Value Ref Range Status   MRSA by PCR Next Gen NOT DETECTED NOT DETECTED Final    Comment: (NOTE) The GeneXpert MRSA Assay (FDA approved for NASAL specimens only), is one component of a comprehensive MRSA colonization surveillance program. It is not intended to diagnose MRSA infection nor to guide or monitor treatment for MRSA infections. Test performance is not FDA approved in patients less than 74 years old. Performed at [facility], [place] [address] [place], [place] [address]   Culture, Respiratory w Gram Stain     Status: None   Collection Time: 06/22/22  5:59 PM   Specimen: Tracheal Aspirate; Respiratory  Result Value Ref Range Status   Specimen Description   Final    TRACHEAL ASPIRATE Performed at [place] [address], [place],  [place] [address]    Special Requests   Final    NONE Performed at [facility], [place] [address] [place], [place] [address]    Gram Stain   Final    RARE WBC PRESENT, PREDOMINANTLY PMN NO ORGANISMS SEEN    Culture   Final    NO GROWTH 2 DAYS Performed at [place] [address] [place], [place] [address]    Report Status 06/25/2022 FINAL  Final    Lab Basic Metabolic Panel: Recent Labs  Lab 06/22/22 1056 06/23/22 0400 06/24/22 0355 06/24/22 0640 06/24/22 1039 06/24/22 1705 06/24/22 2125 06/25/22 0407 06/25/22 1024 06/25/22 1138 07-22-2022 0509  NA 139 138 139  --   --   --   --  135  --   --  131*  K 4.0 4.9 5.8*   < > 5.4* 5.7* 5.5*  5.6* 5.4*  5.3* 4.7 5.0 4.6  CL 109 108 109  --   --   --   --  101  --   --  92*  CO2 21* 20* 18*  --   --   --   --  22  --   --  26  GLUCOSE 127* 189* 97  --   --   --   --  151*  --   --  197*  BUN 63* 73* 84*  --   --   --   --  91*  --   --  88*  CREATININE 2.72* 3.02* 3.46*  --   --   --   --  3.79*  --   --  3.85*  CALCIUM 8.0* 8.0* 8.2*  --   --   --   --  7.7*  --   --  7.0*  MG  --  1.5*  --   --  2.0 2.1  --  2.0  --  1.8  --   PHOS  --   --   --   --  5.7* 6.4*  --  6.3*  --  5.8* 6.2*   < > = values in this interval not displayed.   Liver Function Tests: Recent Labs  Lab 06/21/22 0439 06/22/22 1056 07-19-22 0509  AST 45* 38  --   ALT 18 18  --   ALKPHOS 77 108  --   BILITOT 0.6 0.6  --   PROT 5.4* 5.2*  --   ALBUMIN 1.8* 1.7* <1.5*   No results for input(s): "LIPASE", "AMYLASE" in the last 168 hours. No results for input(s): "AMMONIA" in the last 168 hours. CBC: Recent Labs  Lab 06/21/22 0439 06/22/22 0500 06/23/22 0400 06/24/22 0355 06/25/22 0407 2022/07/19 0509  WBC 7.9 5.9 8.6 5.5 5.1 4.8  NEUTROABS 7.1  --   --   --   --   --   HGB 9.1* 8.4* 9.0* 9.6* 8.4* 7.6*  HCT 27.6* 26.1* 28.2* 29.9* 26.3* 23.9*  MCV 88.7 90.3 91.3 89.8 90.1 88.5  PLT 188 177 231 168 142* 112*    Cardiac Enzymes: No results for input(s): "CKTOTAL", "CKMB", "CKMBINDEX", "TROPONINI" in the last 168 hours. Sepsis Labs: Recent Labs  Lab 06/23/22 0400 06/24/22 0355 06/25/22 0407 2022-07-19 0509  WBC 8.6 5.5 5.1 4.8    Procedures/Operations  See Epic for details   Maryjane Hurter 06/27/2022, 5:55 PM

## 2022-07-22 DEATH — deceased
